# Patient Record
Sex: Female | Born: 1940
Health system: Southern US, Community
[De-identification: ages and names within clinical notes are randomized; demographics above are authoritative.]

## PROBLEM LIST (undated history)

## (undated) DIAGNOSIS — N3941 Urge incontinence: Secondary | ICD-10-CM

## (undated) DIAGNOSIS — E119 Type 2 diabetes mellitus without complications: Secondary | ICD-10-CM

## (undated) DIAGNOSIS — I890 Lymphedema, not elsewhere classified: Secondary | ICD-10-CM

## (undated) DIAGNOSIS — E785 Hyperlipidemia, unspecified: Secondary | ICD-10-CM

## (undated) DIAGNOSIS — Z87440 Personal history of urinary (tract) infections: Secondary | ICD-10-CM

## (undated) DIAGNOSIS — J4530 Mild persistent asthma, uncomplicated: Secondary | ICD-10-CM

## (undated) DIAGNOSIS — I10 Essential (primary) hypertension: Secondary | ICD-10-CM

## (undated) DIAGNOSIS — C50311 Malignant neoplasm of lower-inner quadrant of right female breast: Secondary | ICD-10-CM

## (undated) DIAGNOSIS — G4733 Obstructive sleep apnea (adult) (pediatric): Secondary | ICD-10-CM

## (undated) DIAGNOSIS — K589 Irritable bowel syndrome without diarrhea: Secondary | ICD-10-CM

## (undated) DIAGNOSIS — C55 Malignant neoplasm of uterus, part unspecified: Secondary | ICD-10-CM

## (undated) DIAGNOSIS — N3020 Other chronic cystitis without hematuria: Secondary | ICD-10-CM

## (undated) DIAGNOSIS — Z170 Estrogen receptor positive status [ER+]: Secondary | ICD-10-CM

## (undated) DIAGNOSIS — Z9989 Dependence on other enabling machines and devices: Secondary | ICD-10-CM

## (undated) DIAGNOSIS — M1990 Unspecified osteoarthritis, unspecified site: Secondary | ICD-10-CM

## (undated) DIAGNOSIS — F411 Generalized anxiety disorder: Secondary | ICD-10-CM

## (undated) DIAGNOSIS — S42309A Unspecified fracture of shaft of humerus, unspecified arm, initial encounter for closed fracture: Secondary | ICD-10-CM

## (undated) DIAGNOSIS — N281 Cyst of kidney, acquired: Secondary | ICD-10-CM

## (undated) DIAGNOSIS — Z923 Personal history of irradiation: Secondary | ICD-10-CM

## (undated) DIAGNOSIS — J302 Other seasonal allergic rhinitis: Secondary | ICD-10-CM

## (undated) DIAGNOSIS — S40029A Contusion of unspecified upper arm, initial encounter: Secondary | ICD-10-CM

## (undated) DIAGNOSIS — Z9221 Personal history of antineoplastic chemotherapy: Secondary | ICD-10-CM

## (undated) DIAGNOSIS — R413 Other amnesia: Secondary | ICD-10-CM

## (undated) DIAGNOSIS — Z973 Presence of spectacles and contact lenses: Secondary | ICD-10-CM

## (undated) HISTORY — PX: CATARACT EXTRACTION W/ INTRAOCULAR LENS  IMPLANT, BILATERAL: SHX1307

## (undated) HISTORY — DX: Hyperlipidemia, unspecified: E78.5

## (undated) HISTORY — DX: Other amnesia: R41.3

## (undated) HISTORY — PX: UMBILICAL HERNIA REPAIR: SHX196

## (undated) HISTORY — PX: COLONOSCOPY W/ POLYPECTOMY: SHX1380

## (undated) HISTORY — DX: Essential (primary) hypertension: I10

## (undated) HISTORY — PX: LUMBAR SPINE SURGERY: SHX701

---

## 1996-12-22 HISTORY — PX: BUNIONECTOMY: SHX129

## 1998-12-22 HISTORY — PX: BREAST SURGERY: SHX581

## 1999-12-23 HISTORY — PX: BREAST CYST EXCISION: SHX579

## 2004-05-30 ENCOUNTER — Other Ambulatory Visit: Payer: Self-pay

## 2004-11-25 ENCOUNTER — Ambulatory Visit: Payer: Self-pay | Admitting: Internal Medicine

## 2005-11-27 ENCOUNTER — Ambulatory Visit: Payer: Self-pay | Admitting: Internal Medicine

## 2005-12-01 ENCOUNTER — Ambulatory Visit: Payer: Self-pay | Admitting: Unknown Physician Specialty

## 2005-12-22 HISTORY — PX: TOTAL KNEE ARTHROPLASTY: SHX125

## 2006-03-18 ENCOUNTER — Ambulatory Visit: Payer: Self-pay | Admitting: Unknown Physician Specialty

## 2006-04-14 ENCOUNTER — Encounter: Payer: Self-pay | Admitting: Unknown Physician Specialty

## 2006-04-21 ENCOUNTER — Encounter: Payer: Self-pay | Admitting: Unknown Physician Specialty

## 2006-05-22 ENCOUNTER — Encounter: Payer: Self-pay | Admitting: Unknown Physician Specialty

## 2006-06-21 ENCOUNTER — Encounter: Payer: Self-pay | Admitting: Unknown Physician Specialty

## 2006-07-22 ENCOUNTER — Encounter: Payer: Self-pay | Admitting: Unknown Physician Specialty

## 2006-08-13 ENCOUNTER — Ambulatory Visit: Payer: Self-pay | Admitting: Unknown Physician Specialty

## 2006-08-17 ENCOUNTER — Emergency Department: Payer: Self-pay | Admitting: Emergency Medicine

## 2006-08-22 ENCOUNTER — Encounter: Payer: Self-pay | Admitting: Unknown Physician Specialty

## 2006-09-09 ENCOUNTER — Encounter: Payer: Self-pay | Admitting: Unknown Physician Specialty

## 2006-09-21 ENCOUNTER — Encounter: Payer: Self-pay | Admitting: Unknown Physician Specialty

## 2006-10-22 ENCOUNTER — Encounter: Payer: Self-pay | Admitting: Unknown Physician Specialty

## 2006-11-21 ENCOUNTER — Encounter: Payer: Self-pay | Admitting: Unknown Physician Specialty

## 2006-12-22 ENCOUNTER — Encounter: Payer: Self-pay | Admitting: Unknown Physician Specialty

## 2006-12-30 ENCOUNTER — Ambulatory Visit: Payer: Self-pay | Admitting: Internal Medicine

## 2007-01-22 ENCOUNTER — Encounter: Payer: Self-pay | Admitting: Unknown Physician Specialty

## 2007-03-15 ENCOUNTER — Other Ambulatory Visit: Payer: Self-pay

## 2007-03-15 ENCOUNTER — Ambulatory Visit: Payer: Self-pay | Admitting: General Practice

## 2007-03-29 ENCOUNTER — Inpatient Hospital Stay: Payer: Self-pay | Admitting: General Practice

## 2007-04-03 ENCOUNTER — Emergency Department: Payer: Self-pay

## 2007-04-19 ENCOUNTER — Encounter: Payer: Self-pay | Admitting: General Practice

## 2007-04-22 ENCOUNTER — Encounter: Payer: Self-pay | Admitting: General Practice

## 2007-05-23 ENCOUNTER — Encounter: Payer: Self-pay | Admitting: General Practice

## 2007-06-22 ENCOUNTER — Encounter: Payer: Self-pay | Admitting: General Practice

## 2007-06-23 ENCOUNTER — Encounter: Payer: Self-pay | Admitting: Internal Medicine

## 2007-07-23 ENCOUNTER — Encounter: Payer: Self-pay | Admitting: Internal Medicine

## 2007-08-23 ENCOUNTER — Encounter: Payer: Self-pay | Admitting: Internal Medicine

## 2007-10-05 ENCOUNTER — Other Ambulatory Visit: Payer: Self-pay

## 2007-10-05 ENCOUNTER — Ambulatory Visit: Payer: Self-pay | Admitting: General Practice

## 2007-10-18 ENCOUNTER — Inpatient Hospital Stay: Payer: Self-pay | Admitting: General Practice

## 2007-11-02 ENCOUNTER — Encounter: Payer: Self-pay | Admitting: General Practice

## 2007-11-22 ENCOUNTER — Encounter: Payer: Self-pay | Admitting: General Practice

## 2007-12-23 ENCOUNTER — Encounter: Payer: Self-pay | Admitting: General Practice

## 2008-01-23 ENCOUNTER — Encounter: Payer: Self-pay | Admitting: General Practice

## 2008-01-27 ENCOUNTER — Ambulatory Visit: Payer: Self-pay | Admitting: Internal Medicine

## 2009-01-30 ENCOUNTER — Ambulatory Visit: Payer: Self-pay | Admitting: Internal Medicine

## 2009-02-19 ENCOUNTER — Ambulatory Visit: Payer: Self-pay | Admitting: Unknown Physician Specialty

## 2009-12-22 HISTORY — PX: TOTAL SHOULDER REPLACEMENT: SUR1217

## 2010-02-04 ENCOUNTER — Ambulatory Visit: Payer: Self-pay | Admitting: Internal Medicine

## 2010-06-27 ENCOUNTER — Ambulatory Visit: Payer: Self-pay | Admitting: General Practice

## 2010-07-08 ENCOUNTER — Inpatient Hospital Stay: Payer: Self-pay | Admitting: General Practice

## 2010-07-09 LAB — PATHOLOGY REPORT

## 2010-07-25 ENCOUNTER — Encounter: Payer: Self-pay | Admitting: General Practice

## 2010-08-22 ENCOUNTER — Encounter: Payer: Self-pay | Admitting: General Practice

## 2010-09-21 ENCOUNTER — Encounter: Payer: Self-pay | Admitting: General Practice

## 2010-10-22 ENCOUNTER — Encounter: Payer: Self-pay | Admitting: General Practice

## 2010-11-23 ENCOUNTER — Emergency Department: Payer: Self-pay | Admitting: Internal Medicine

## 2011-02-05 ENCOUNTER — Ambulatory Visit: Payer: Self-pay | Admitting: Otolaryngology

## 2011-02-11 ENCOUNTER — Ambulatory Visit: Payer: Self-pay | Admitting: Internal Medicine

## 2011-02-13 ENCOUNTER — Ambulatory Visit: Payer: Self-pay | Admitting: Otolaryngology

## 2011-02-17 LAB — PATHOLOGY REPORT

## 2012-02-13 ENCOUNTER — Ambulatory Visit: Payer: Self-pay | Admitting: Obstetrics and Gynecology

## 2012-05-15 ENCOUNTER — Emergency Department: Payer: Self-pay | Admitting: Emergency Medicine

## 2012-05-15 LAB — URINALYSIS, COMPLETE
Bacteria: NONE SEEN
Bilirubin,UR: NEGATIVE
Glucose,UR: NEGATIVE mg/dL (ref 0–75)
Ketone: NEGATIVE
Nitrite: NEGATIVE
Protein: 100
RBC,UR: 1007 /HPF (ref 0–5)
Specific Gravity: 1.023 (ref 1.003–1.030)
WBC UR: 308 /HPF (ref 0–5)

## 2012-05-17 LAB — URINE CULTURE

## 2012-09-14 ENCOUNTER — Ambulatory Visit: Payer: Self-pay | Admitting: Internal Medicine

## 2013-02-14 ENCOUNTER — Ambulatory Visit: Payer: Self-pay | Admitting: Obstetrics and Gynecology

## 2013-12-22 HISTORY — PX: NASAL SINUS SURGERY: SHX719

## 2014-02-15 ENCOUNTER — Ambulatory Visit: Payer: Self-pay | Admitting: Obstetrics and Gynecology

## 2014-03-23 ENCOUNTER — Ambulatory Visit: Payer: Self-pay | Admitting: Surgery

## 2014-03-23 DIAGNOSIS — I10 Essential (primary) hypertension: Secondary | ICD-10-CM

## 2014-04-06 ENCOUNTER — Ambulatory Visit: Payer: Self-pay | Admitting: Surgery

## 2014-04-21 DIAGNOSIS — Z9989 Dependence on other enabling machines and devices: Secondary | ICD-10-CM | POA: Insufficient documentation

## 2014-04-21 DIAGNOSIS — D126 Benign neoplasm of colon, unspecified: Secondary | ICD-10-CM | POA: Insufficient documentation

## 2014-04-21 DIAGNOSIS — I878 Other specified disorders of veins: Secondary | ICD-10-CM | POA: Insufficient documentation

## 2014-04-21 DIAGNOSIS — R87619 Unspecified abnormal cytological findings in specimens from cervix uteri: Secondary | ICD-10-CM | POA: Insufficient documentation

## 2014-04-21 DIAGNOSIS — J45909 Unspecified asthma, uncomplicated: Secondary | ICD-10-CM | POA: Insufficient documentation

## 2014-04-21 DIAGNOSIS — L508 Other urticaria: Secondary | ICD-10-CM | POA: Insufficient documentation

## 2014-04-21 DIAGNOSIS — N6019 Diffuse cystic mastopathy of unspecified breast: Secondary | ICD-10-CM | POA: Insufficient documentation

## 2014-04-21 DIAGNOSIS — F419 Anxiety disorder, unspecified: Secondary | ICD-10-CM | POA: Insufficient documentation

## 2014-05-22 ENCOUNTER — Ambulatory Visit: Payer: Self-pay | Admitting: Unknown Physician Specialty

## 2014-05-25 LAB — PATHOLOGY REPORT

## 2014-09-04 ENCOUNTER — Encounter: Payer: Self-pay | Admitting: Internal Medicine

## 2014-09-04 ENCOUNTER — Ambulatory Visit (INDEPENDENT_AMBULATORY_CARE_PROVIDER_SITE_OTHER): Payer: Medicare PPO | Admitting: Internal Medicine

## 2014-09-04 VITALS — BP 122/68 | HR 64 | Temp 97.8°F | Ht 61.0 in | Wt 238.0 lb

## 2014-09-04 DIAGNOSIS — J309 Allergic rhinitis, unspecified: Secondary | ICD-10-CM

## 2014-09-04 DIAGNOSIS — J4530 Mild persistent asthma, uncomplicated: Secondary | ICD-10-CM | POA: Insufficient documentation

## 2014-09-04 DIAGNOSIS — J45909 Unspecified asthma, uncomplicated: Secondary | ICD-10-CM

## 2014-09-04 DIAGNOSIS — I10 Essential (primary) hypertension: Secondary | ICD-10-CM

## 2014-09-04 DIAGNOSIS — N3941 Urge incontinence: Secondary | ICD-10-CM

## 2014-09-04 DIAGNOSIS — F411 Generalized anxiety disorder: Secondary | ICD-10-CM

## 2014-09-04 DIAGNOSIS — Z23 Encounter for immunization: Secondary | ICD-10-CM

## 2014-09-04 DIAGNOSIS — K219 Gastro-esophageal reflux disease without esophagitis: Secondary | ICD-10-CM

## 2014-09-04 DIAGNOSIS — J302 Other seasonal allergic rhinitis: Secondary | ICD-10-CM | POA: Insufficient documentation

## 2014-09-04 DIAGNOSIS — E785 Hyperlipidemia, unspecified: Secondary | ICD-10-CM

## 2014-09-04 LAB — COMPREHENSIVE METABOLIC PANEL
ALT: 17 U/L (ref 0–35)
AST: 16 U/L (ref 0–37)
Albumin: 3.7 g/dL (ref 3.5–5.2)
Alkaline Phosphatase: 53 U/L (ref 39–117)
BUN: 17 mg/dL (ref 6–23)
CALCIUM: 10 mg/dL (ref 8.4–10.5)
CO2: 27 meq/L (ref 19–32)
CREATININE: 0.7 mg/dL (ref 0.4–1.2)
Chloride: 102 mEq/L (ref 96–112)
GFR: 90.71 mL/min (ref >60.00)
GLUCOSE: 107 mg/dL — AB (ref 70–99)
Potassium: 3.9 mEq/L (ref 3.5–5.1)
Sodium: 138 mEq/L (ref 135–145)
Total Bilirubin: 0.5 mg/dL (ref 0.2–1.2)
Total Protein: 7.1 g/dL (ref 6.0–8.3)

## 2014-09-04 LAB — CBC
HCT: 42.2 % (ref 36.0–46.0)
HEMOGLOBIN: 13.9 g/dL (ref 12.0–15.0)
MCHC: 32.9 g/dL (ref 30.0–36.0)
MCV: 88.8 fl (ref 78.0–100.0)
Platelets: 295 10*3/uL (ref 150.0–400.0)
RBC: 4.75 Mil/uL (ref 3.87–5.11)
RDW: 13.7 % (ref 11.5–15.5)
WBC: 7.3 10*3/uL (ref 4.0–10.5)

## 2014-09-04 LAB — LIPID PANEL
Cholesterol: 156 mg/dL (ref 0–200)
HDL: 40.2 mg/dL (ref >39.00)
NonHDL: 115.8
Total CHOL/HDL Ratio: 4
Triglycerides: 212 mg/dL — ABNORMAL HIGH (ref 0.0–149.0)
VLDL: 42.4 mg/dL — ABNORMAL HIGH (ref 0.0–40.0)

## 2014-09-04 LAB — HEMOGLOBIN A1C: HEMOGLOBIN A1C: 6.7 % — AB (ref 4.6–6.5)

## 2014-09-04 LAB — LDL CHOLESTEROL, DIRECT: Direct LDL: 96.5 mg/dL

## 2014-09-04 NOTE — Progress Notes (Signed)
Pre visit review using our clinic review tool, if applicable. No additional management support is needed unless otherwise documented below in the visit note. 

## 2014-09-04 NOTE — Assessment & Plan Note (Signed)
Takes zyrtec daily Gets allergy shots weekly

## 2014-09-04 NOTE — Progress Notes (Signed)
HPI  Pt presents to the clinic today to establish care. She is transferring care from Dr. Doy Hutching.   Flu: 2014 Tetanus: 2015 Pneuomvax: 2013 Prevnar: never Zostovax: 2015 Pap Smear: 12/2013 Mammogram: 2015- normal Bone Density: 2014- normal Colon Screening: 2015 Eye Doctor: yearly Dentist: biannually  Past Medical History  Diagnosis Date  . Asthma   . Arthritis   . Chicken pox   . Allergy   . Hypertension   . Hyperlipidemia   . Colon polyps   . Phlebitis     Current Outpatient Prescriptions  Medication Sig Dispense Refill  . albuterol (PROAIR HFA) 108 (90 BASE) MCG/ACT inhaler Inhale 2 puffs into the lungs every 6 (six) hours as needed.      Marland Kitchen albuterol-ipratropium (COMBIVENT) 18-103 MCG/ACT inhaler Inhale 2 puffs into the lungs every 4 (four) hours.      . ALPRAZolam (XANAX) 0.25 MG tablet Take 0.25 mg by mouth 2 (two) times daily as needed.      Marland Kitchen aspirin EC 81 MG tablet Take 81 mg by mouth daily.      . Calcium Carb-Ergocalciferol 500-200 MG-UNIT TABS Take 1 tablet by mouth 2 (two) times daily.      . cetirizine (ZYRTEC) 10 MG tablet Take 10 mg by mouth daily.      . cloNIDine (CATAPRES) 0.1 MG tablet Take 0.1 mg by mouth 2 (two) times daily.      Marland Kitchen EPINEPHrine 0.3 mg/0.3 mL IJ SOAJ injection Inject 0.3 mg into the muscle as needed.      . naproxen sodium (ALEVE) 220 MG tablet Take 2 tablets by mouth as needed.      . Olmesartan-Amlodipine-HCTZ (TRIBENZOR) 40-5-25 MG TABS Take 1 tablet by mouth daily.      Marland Kitchen omeprazole (PRILOSEC) 40 MG capsule Take 40 mg by mouth daily.      . polyethylene glycol powder (GLYCOLAX/MIRALAX) powder Take 17 g by mouth as needed.      . pravastatin (PRAVACHOL) 40 MG tablet Take 40 mg by mouth daily.      . ranitidine (ZANTAC) 150 MG tablet Take 150 mg by mouth daily.      . solifenacin (VESICARE) 10 MG tablet Take 10 mg by mouth daily.       No current facility-administered medications for this visit.    Allergies  Allergen Reactions  .  Other Swelling    Cats, dogs, mold    Family History  Problem Relation Age of Onset  . Arthritis Mother   . Stroke Mother   . Hypertension Mother   . Cancer Father     Prostate  . Stroke Father   . Hypertension Father   . Hypertension Maternal Grandmother   . Rheum arthritis Maternal Grandfather   . Stroke Maternal Grandfather   . Hypertension Maternal Grandfather   . Cancer Paternal Grandmother     Colon  . Hypertension Paternal Grandmother   . Hypertension Paternal Grandfather     History   Social History  . Marital Status: Married    Spouse Name: N/A    Number of Children: N/A  . Years of Education: N/A   Occupational History  . Not on file.   Social History Main Topics  . Smoking status: Former Research scientist (life sciences)  . Smokeless tobacco: Not on file     Comment: quit 22 years ago  . Alcohol Use: Yes     Comment: rare  . Drug Use: Not on file  . Sexual Activity: Not on file  Other Topics Concern  . Not on file   Social History Narrative  . No narrative on file    ROS:  Constitutional: Pt reports fatigue. Denies fever, malaise, headache or abrupt weight changes.  HEENT: Denies eye pain, eye redness, ear pain, ringing in the ears, wax buildup, runny nose, nasal congestion, bloody nose, or sore throat. Respiratory: Pt reports shortness of breath with exertion. Denies difficulty breathing, shortness of breath, cough or sputum production.   Cardiovascular: Denies chest pain, chest tightness, palpitations or swelling in the hands or feet.  Gastrointestinal: Denies abdominal pain, bloating, constipation, diarrhea or blood in the stool.  GU: Pt reports urge incontinence. Denies frequency, pain with urination, blood in urine, odor or discharge. Musculoskeletal: Pt reports arthritis in knees and shoulders. Denies decrease in range of motion, difficulty with gait, muscle pain or joint swelling.  Skin: Denies redness, rashes, lesions or ulcercations.  Neurological: Denies  dizziness, difficulty with memory, difficulty with speech or problems with balance and coordination.   No other specific complaints in a complete review of systems (except as listed in HPI above).  PE:  BP 122/68  Pulse 64  Temp(Src) 97.8 F (36.6 C) (Oral)  Ht 5\' 1"  (1.549 m)  Wt 238 lb (107.956 kg)  BMI 44.99 kg/m2  Wt Readings from Last 3 Encounters:  09/04/14 238 lb (107.956 kg)    General: Appears her stated age, obese but well developed, well nourished in NAD. Cardiovascular: Normal rate and rhythm. Distant S1,S2 noted.  No murmur, rubs or gallops noted. No JVD. Trace edema in LLE.  No carotid bruits noted. Pulmonary/Chest: Normal effort and positive vesicular breath sounds. No respiratory distress. No wheezes, rales or ronchi noted.  Abdomen: Soft and nontender. Normal bowel sounds, no bruits noted. No distention or masses noted. Liver, spleen and kidneys non palpable. Neurological: Alert and oriented. Cranial nerves II-XII intact.  Psychiatric: Mood and affect normal. Behavior is normal. Judgment and thought content normal.    Assessment and Plan:  Health Maintenance:  Flu shot today  RTC in 6 months for 6 month followup of chronic conditions

## 2014-09-04 NOTE — Assessment & Plan Note (Addendum)
On prilosec and zantac Will check CBC and CMET today

## 2014-09-04 NOTE — Assessment & Plan Note (Signed)
Encouraged her to work on diet and exercise Will check A1C as she reports she has been borderline diabetic

## 2014-09-04 NOTE — Patient Instructions (Addendum)
Fat and Cholesterol Control Diet Fat and cholesterol levels in your blood and organs are influenced by your diet. High levels of fat and cholesterol may lead to diseases of the heart, small and large blood vessels, gallbladder, liver, and pancreas. CONTROLLING FAT AND CHOLESTEROL WITH DIET Although exercise and lifestyle factors are important, your diet is key. That is because certain foods are known to raise cholesterol and others to lower it. The goal is to balance foods for their effect on cholesterol and more importantly, to replace saturated and trans fat with other types of fat, such as monounsaturated fat, polyunsaturated fat, and omega-3 fatty acids. On average, a person should consume no more than 15 to 17 g of saturated fat daily. Saturated and trans fats are considered "bad" fats, and they will raise LDL cholesterol. Saturated fats are primarily found in animal products such as meats, butter, and cream. However, that does not mean you need to give up all your favorite foods. Today, there are good tasting, low-fat, low-cholesterol substitutes for most of the things you like to eat. Choose low-fat or nonfat alternatives. Choose round or loin cuts of red meat. These types of cuts are lowest in fat and cholesterol. Chicken (without the skin), fish, veal, and ground turkey breast are great choices. Eliminate fatty meats, such as hot dogs and salami. Even shellfish have little or no saturated fat. Have a 3 oz (85 g) portion when you eat lean meat, poultry, or fish. Trans fats are also called "partially hydrogenated oils." They are oils that have been scientifically manipulated so that they are solid at room temperature resulting in a longer shelf life and improved taste and texture of foods in which they are added. Trans fats are found in stick margarine, some tub margarines, cookies, crackers, and baked goods.  When baking and cooking, oils are a great substitute for butter. The monounsaturated oils are  especially beneficial since it is believed they lower LDL and raise HDL. The oils you should avoid entirely are saturated tropical oils, such as coconut and palm.  Remember to eat a lot from food groups that are naturally free of saturated and trans fat, including fish, fruit, vegetables, beans, grains (barley, rice, couscous, bulgur wheat), and pasta (without cream sauces).  IDENTIFYING FOODS THAT LOWER FAT AND CHOLESTEROL  Soluble fiber may lower your cholesterol. This type of fiber is found in fruits such as apples, vegetables such as broccoli, potatoes, and carrots, legumes such as beans, peas, and lentils, and grains such as barley. Foods fortified with plant sterols (phytosterol) may also lower cholesterol. You should eat at least 2 g per day of these foods for a cholesterol lowering effect.  Read package labels to identify low-saturated fats, trans fat free, and low-fat foods at the supermarket. Select cheeses that have only 2 to 3 g saturated fat per ounce. Use a heart-healthy tub margarine that is free of trans fats or partially hydrogenated oil. When buying baked goods (cookies, crackers), avoid partially hydrogenated oils. Breads and muffins should be made from whole grains (whole-wheat or whole oat flour, instead of "flour" or "enriched flour"). Buy non-creamy canned soups with reduced salt and no added fats.  FOOD PREPARATION TECHNIQUES  Never deep-fry. If you must fry, either stir-fry, which uses very little fat, or use non-stick cooking sprays. When possible, broil, bake, or roast meats, and steam vegetables. Instead of putting butter or margarine on vegetables, use lemon and herbs, applesauce, and cinnamon (for squash and sweet potatoes). Use nonfat   yogurt, salsa, and low-fat dressings for salads.  LOW-SATURATED FAT / LOW-FAT FOOD SUBSTITUTES Meats / Saturated Fat (g)  Avoid: Steak, marbled (3 oz/85 g) / 11 g  Choose: Steak, lean (3 oz/85 g) / 4 g  Avoid: Hamburger (3 oz/85 g) / 7  g  Choose: Hamburger, lean (3 oz/85 g) / 5 g  Avoid: Ham (3 oz/85 g) / 6 g  Choose: Ham, lean cut (3 oz/85 g) / 2.4 g  Avoid: Chicken, with skin, dark meat (3 oz/85 g) / 4 g  Choose: Chicken, skin removed, dark meat (3 oz/85 g) / 2 g  Avoid: Chicken, with skin, light meat (3 oz/85 g) / 2.5 g  Choose: Chicken, skin removed, light meat (3 oz/85 g) / 1 g Dairy / Saturated Fat (g)  Avoid: Whole milk (1 cup) / 5 g  Choose: Low-fat milk, 2% (1 cup) / 3 g  Choose: Low-fat milk, 1% (1 cup) / 1.5 g  Choose: Skim milk (1 cup) / 0.3 g  Avoid: Hard cheese (1 oz/28 g) / 6 g  Choose: Skim milk cheese (1 oz/28 g) / 2 to 3 g  Avoid: Cottage cheese, 4% fat (1 cup) / 6.5 g  Choose: Low-fat cottage cheese, 1% fat (1 cup) / 1.5 g  Avoid: Ice cream (1 cup) / 9 g  Choose: Sherbet (1 cup) / 2.5 g  Choose: Nonfat frozen yogurt (1 cup) / 0.3 g  Choose: Frozen fruit bar / trace  Avoid: Whipped cream (1 tbs) / 3.5 g  Choose: Nondairy whipped topping (1 tbs) / 1 g Condiments / Saturated Fat (g)  Avoid: Mayonnaise (1 tbs) / 2 g  Choose: Low-fat mayonnaise (1 tbs) / 1 g  Avoid: Butter (1 tbs) / 7 g  Choose: Extra light margarine (1 tbs) / 1 g  Avoid: Coconut oil (1 tbs) / 11.8 g  Choose: Olive oil (1 tbs) / 1.8 g  Choose: Corn oil (1 tbs) / 1.7 g  Choose: Safflower oil (1 tbs) / 1.2 g  Choose: Sunflower oil (1 tbs) / 1.4 g  Choose: Soybean oil (1 tbs) / 2.4 g  Choose: Canola oil (1 tbs) / 1 g Document Released: 12/08/2005 Document Revised: 04/04/2013 Document Reviewed: 03/08/2014 [employer] Patient Information 2015 [employer]. This information is not intended to replace advice given to you by your health care provider. Make sure you discuss any questions you have with your health care provider.  

## 2014-09-04 NOTE — Addendum Note (Signed)
Addended by: Lurlean Nanny on: 09/04/2014 11:14 AM   Modules accepted: Orders

## 2014-09-04 NOTE — Assessment & Plan Note (Signed)
Well controlled on Tribenzor and Clonidine Will check CBC and CMET today Consider peeling back medications at next visit

## 2014-09-04 NOTE — Assessment & Plan Note (Signed)
Takes xanax BID

## 2014-09-04 NOTE — Assessment & Plan Note (Signed)
No issues on pravastatin Will check CMET and Lipid profile today

## 2014-09-04 NOTE — Assessment & Plan Note (Signed)
Improved on Vesicare

## 2014-09-04 NOTE — Assessment & Plan Note (Signed)
On combivent daily Rare albuterol use

## 2014-09-18 ENCOUNTER — Telehealth: Payer: Self-pay | Admitting: Internal Medicine

## 2014-09-18 NOTE — Telephone Encounter (Signed)
Patient's husband received your letter and said that you need to call her back at work or you can call her husband,Russell, at [phone].  Patient's husband said she doesn't answer her cell phone.

## 2014-09-19 NOTE — Telephone Encounter (Signed)
Pt left v/m requesting cb after receiving letter.

## 2014-10-02 NOTE — Telephone Encounter (Signed)
Lab letter mailed. 

## 2015-02-01 ENCOUNTER — Other Ambulatory Visit: Payer: Self-pay

## 2015-02-01 MED ORDER — OLMESARTAN-AMLODIPINE-HCTZ 40-5-25 MG PO TABS: 1.0000 | ORAL_TABLET | Freq: Every day | ORAL | Status: DC

## 2015-02-01 MED ORDER — CLONIDINE HCL 0.1 MG PO TABS: 0.1000 mg | ORAL_TABLET | Freq: Two times a day (BID) | ORAL | Status: DC

## 2015-02-01 NOTE — Telephone Encounter (Signed)
Pt left note requesting refill clonidine and tribenzor [facility] [address]; pt out of meds. pt has f/u appt 04/02/15. Left v/m for pt refills done and keep appt on 04/02/15.

## 2015-02-06 NOTE — Telephone Encounter (Signed)
Pt request status of clonidine refill; spoke with Alyse Low at [address] and rx has been ready for pick up. Pt notified and voiced understanding.

## 2015-02-15 ENCOUNTER — Other Ambulatory Visit: Payer: Self-pay

## 2015-02-15 MED ORDER — PRAVASTATIN SODIUM 40 MG PO TABS: 40.0000 mg | ORAL_TABLET | Freq: Every day | ORAL | Status: DC

## 2015-02-15 NOTE — Telephone Encounter (Signed)
Pt request refill pravastatin to [facility] [address]. Pt has 6 mth f/u 04/02/2015. Advised pt done.

## 2015-02-23 ENCOUNTER — Other Ambulatory Visit: Payer: Self-pay

## 2015-02-23 MED ORDER — SOLIFENACIN SUCCINATE 10 MG PO TABS: 10.0000 mg | ORAL_TABLET | Freq: Every day | ORAL | Status: DC

## 2015-02-23 NOTE — Telephone Encounter (Signed)
Pt request refill vesicare to [facility] [address] until pt seen 04/02/15. Advised pt done.

## 2015-03-24 ENCOUNTER — Other Ambulatory Visit: Payer: Self-pay | Admitting: Internal Medicine

## 2015-04-02 ENCOUNTER — Ambulatory Visit: Payer: Medicare PPO | Admitting: Internal Medicine

## 2015-04-04 ENCOUNTER — Ambulatory Visit (INDEPENDENT_AMBULATORY_CARE_PROVIDER_SITE_OTHER): Payer: Medicare PPO | Admitting: Internal Medicine

## 2015-04-04 ENCOUNTER — Encounter: Payer: Self-pay | Admitting: Internal Medicine

## 2015-04-04 VITALS — BP 128/80 | HR 70 | Temp 98.2°F | Wt 239.0 lb

## 2015-04-04 DIAGNOSIS — Z23 Encounter for immunization: Secondary | ICD-10-CM

## 2015-04-04 DIAGNOSIS — N3941 Urge incontinence: Secondary | ICD-10-CM

## 2015-04-04 DIAGNOSIS — J4530 Mild persistent asthma, uncomplicated: Secondary | ICD-10-CM

## 2015-04-04 DIAGNOSIS — Z13820 Encounter for screening for osteoporosis: Secondary | ICD-10-CM | POA: Diagnosis not present

## 2015-04-04 DIAGNOSIS — I10 Essential (primary) hypertension: Secondary | ICD-10-CM

## 2015-04-04 DIAGNOSIS — Z1239 Encounter for other screening for malignant neoplasm of breast: Secondary | ICD-10-CM | POA: Diagnosis not present

## 2015-04-04 DIAGNOSIS — E119 Type 2 diabetes mellitus without complications: Secondary | ICD-10-CM

## 2015-04-04 DIAGNOSIS — F411 Generalized anxiety disorder: Secondary | ICD-10-CM

## 2015-04-04 DIAGNOSIS — K219 Gastro-esophageal reflux disease without esophagitis: Secondary | ICD-10-CM

## 2015-04-04 DIAGNOSIS — E785 Hyperlipidemia, unspecified: Secondary | ICD-10-CM | POA: Diagnosis not present

## 2015-04-04 DIAGNOSIS — J302 Other seasonal allergic rhinitis: Secondary | ICD-10-CM

## 2015-04-04 LAB — COMPREHENSIVE METABOLIC PANEL
ALK PHOS: 64 U/L (ref 39–117)
ALT: 18 U/L (ref 0–35)
AST: 18 U/L (ref 0–37)
Albumin: 4 g/dL (ref 3.5–5.2)
BUN: 20 mg/dL (ref 6–23)
CO2: 32 mEq/L (ref 19–32)
Calcium: 10.4 mg/dL (ref 8.4–10.5)
Chloride: 101 mEq/L (ref 96–112)
Creatinine, Ser: 0.64 mg/dL (ref 0.40–1.20)
GFR: 97.13 mL/min (ref >60.00)
GLUCOSE: 89 mg/dL (ref 70–99)
Potassium: 4.3 mEq/L (ref 3.5–5.1)
SODIUM: 138 meq/L (ref 135–145)
Total Bilirubin: 0.4 mg/dL (ref 0.2–1.2)
Total Protein: 7.3 g/dL (ref 6.0–8.3)

## 2015-04-04 LAB — CBC
HEMATOCRIT: 41.6 % (ref 36.0–46.0)
Hemoglobin: 14.1 g/dL (ref 12.0–15.0)
MCHC: 33.9 g/dL (ref 30.0–36.0)
MCV: 86.4 fl (ref 78.0–100.0)
PLATELETS: 310 10*3/uL (ref 150.0–400.0)
RBC: 4.82 Mil/uL (ref 3.87–5.11)
RDW: 13.9 % (ref 11.5–15.5)
WBC: 8.4 10*3/uL (ref 4.0–10.5)

## 2015-04-04 LAB — MICROALBUMIN / CREATININE URINE RATIO
Creatinine,U: 87.8 mg/dL
Microalb Creat Ratio: 0.8 mg/g (ref 0.0–30.0)
Microalb, Ur: <0.7 mg/dL (ref 0.0–1.9)

## 2015-04-04 LAB — LIPID PANEL
Cholesterol: 186 mg/dL (ref 0–200)
HDL: 56.4 mg/dL (ref >39.00)
NonHDL: 129.6
Total CHOL/HDL Ratio: 3
Triglycerides: 201 mg/dL — ABNORMAL HIGH (ref 0.0–149.0)
VLDL: 40.2 mg/dL — ABNORMAL HIGH (ref 0.0–40.0)

## 2015-04-04 LAB — LDL CHOLESTEROL, DIRECT: LDL DIRECT: 105 mg/dL

## 2015-04-04 LAB — HEMOGLOBIN A1C: Hgb A1c MFr Bld: 6.9 % — ABNORMAL HIGH (ref 4.6–6.5)

## 2015-04-04 LAB — VITAMIN D 25 HYDROXY (VIT D DEFICIENCY, FRACTURES): VITD: 26.66 ng/mL — AB (ref 30.00–100.00)

## 2015-04-04 NOTE — Assessment & Plan Note (Signed)
Continue zyrtec daily  

## 2015-04-04 NOTE — Progress Notes (Signed)
Subjective:    Patient ID: Tiffany Arnold, female    DOB: 10-12-1941, 74 y.o.   MRN: [id number]  HPI  Pt presents to the clinic today for 6 month follow up of chronic conditions.  Asthma: Mild, persistent.  She takes Combivent daily. Rare Albuterol use.  HTN: BP well controlled on Tribenzor and Clonidine. Her BP today is 128/80.  GERD: She denies breakthrough symptoms on Prilosec and Zantac. She tries to avoid foods that make her reflux worse. She is complaining of some symptoms of "food getting stuck" when she swallows. She has not choked on her food.  GAD: Her anxiety is well controlled on Xanax twice daily. She denies depression, SI/HI.  HLD: Last LDL 96.5. She denies myalgias on Pravastatin. She does try to consume a low fat diet.  Seasonal allergies: Worse in the spring. She takes Zyrtec daily.  Obesity: Her BMI is 45.18. She has gained 1 lb in the last 6 months.   Urge Incontinence: She takes Vesicare daily. It has helped a little with her incontinence.  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 She does not take any medication for this. Her diabetes is controlled with diet. She does not test her sugars. Her last eye exam was last year. Flu- 08/2014 Pneumovax- 01/2012. Prevnar- never.  She also requests an order of mammogram - wants to go [facility].    Review of Systems      Past Medical History  Diagnosis Date  . Asthma   . Arthritis   . Chicken pox   . Allergy   . Hypertension   . Hyperlipidemia   . Colon polyps   . Phlebitis     Current Outpatient Prescriptions  Medication Sig Dispense Refill  . albuterol (PROAIR HFA) 108 (90 BASE) MCG/ACT inhaler Inhale 2 puffs into the lungs every 6 (six) hours as needed.    Marland Kitchen albuterol-ipratropium (COMBIVENT) 18-103 MCG/ACT inhaler Inhale 2 puffs into the lungs every 4 (four) hours.    . ALPRAZolam (XANAX) 0.25 MG tablet Take 0.25 mg by mouth 2 (two) times daily as needed.    Marland Kitchen aspirin EC 81 MG tablet Take 81 mg by mouth  daily.    . Calcium Carb-Ergocalciferol 500-200 MG-UNIT TABS Take 1 tablet by mouth 2 (two) times daily.    . cetirizine (ZYRTEC) 10 MG tablet Take 10 mg by mouth daily.    . cloNIDine (CATAPRES) 0.1 MG tablet TAKE ONE TABLET BY MOUTH TWICE DAILY 60 tablet 0  . EPINEPHrine 0.3 mg/0.3 mL IJ SOAJ injection Inject 0.3 mg into the muscle as needed.    . naproxen sodium (ALEVE) 220 MG tablet Take 2 tablets by mouth as needed.    . Olmesartan-Amlodipine-HCTZ (TRIBENZOR) 40-5-25 MG TABS Take 1 tablet by mouth daily. 30 tablet 1  . omeprazole (PRILOSEC) 40 MG capsule Take 40 mg by mouth daily.    . polyethylene glycol powder (GLYCOLAX/MIRALAX) powder Take 17 g by mouth as needed.    . pravastatin (PRAVACHOL) 40 MG tablet Take 1 tablet (40 mg total) by mouth daily. 30 tablet 1  . ranitidine (ZANTAC) 150 MG tablet Take 150 mg by mouth daily.    . solifenacin (VESICARE) 10 MG tablet Take 1 tablet (10 mg total) by mouth daily. 30 tablet 1   No current facility-administered medications for this visit.    Allergies  Allergen Reactions  . Other Swelling    Cats, dogs, mold    Family History  Problem Relation Age of  Onset  . Arthritis Mother   . Stroke Mother   . Hypertension Mother   . Cancer Father     Prostate  . Stroke Father   . Hypertension Father   . Hypertension Maternal Grandmother   . Rheum arthritis Maternal Grandfather   . Stroke Maternal Grandfather   . Hypertension Maternal Grandfather   . Cancer Paternal Grandmother     Colon  . Hypertension Paternal Grandmother   . Hypertension Paternal Grandfather     History   Social History  . Marital Status: Married    Spouse Name: N/A  . Number of Children: N/A  . Years of Education: N/A   Occupational History  . Not on file.   Social History Main Topics  . Smoking status: Former Research scientist (life sciences)  . Smokeless tobacco: Not on file     Comment: quit 22 years ago  . Alcohol Use: Yes     Comment: rare  . Drug Use: No  . Sexual  Activity: Not Currently   Other Topics Concern  . Not on file   Social History Narrative     Constitutional: Denies fever, malaise, fatigue, headache or abrupt weight changes.  HEENT: Denies eye pain, eye redness, ear pain, ringing in the ears, wax buildup, runny nose, nasal congestion, bloody nose, or sore throat. Respiratory: Denies difficulty breathing, shortness of breath, cough or sputum production.   Cardiovascular: Denies chest pain, chest tightness, palpitations or swelling in the hands or feet.  Gastrointestinal: Denies abdominal pain, bloating, constipation, diarrhea or blood in the stool.  GU: Denies urgency, frequency, pain with urination, burning sensation, blood in urine, odor or discharge. Musculoskeletal: Denies decrease in range of motion, difficulty with gait, muscle pain or joint pain and swelling.  Skin: Denies redness, rashes, lesions or ulcercations.  Neurological: Denies dizziness, difficulty with memory, difficulty with speech or problems with balance and coordination.  Psych: Denies anxiety, depression, SI/HI.  No other specific complaints in a complete review of systems (except as listed in HPI above).  Objective:   Physical Exam   BP 128/80 mmHg  Pulse 70  Temp(Src) 98.2 F (36.8 C) (Oral)  Wt 239 lb (108.41 kg) Wt Readings from Last 3 Encounters:  04/04/15 239 lb (108.41 kg)  09/04/14 238 lb (107.956 kg)    General: Appears her stated age, well developed, well nourished in NAD. Skin: Warm, dry and intact.  HEENT: Head: normal shape and size;  Nose: mucosa pink and moist, septum midline; Throat/Mouth: Teeth present, mucosa pink and moist, no exudate, lesions or ulcerations noted.  Cardiovascular: Normal rate and rhythm. S1,S2 noted.  No murmur, rubs or gallops noted. No JVD or BLE edema. No carotid bruits noted. Pulmonary/Chest: Normal effort and positive vesicular breath sounds. No respiratory distress. No wheezes, rales or ronchi noted.  Abdomen:  Soft and nontender. Normal bowel sounds, no bruits noted. No distention or masses noted. Liver, spleen and kidneys non palpable. Neurological: Alert and oriented.  Psychiatric: Mood and affect normal. Behavior is normal. Judgment and thought content normal.     BMET    Component Value Date/Time   NA 138 09/04/2014 1028   K 3.9 09/04/2014 1028   CL 102 09/04/2014 1028   CO2 27 09/04/2014 1028   GLUCOSE 107* 09/04/2014 1028   BUN 17 09/04/2014 1028   CREATININE 0.7 09/04/2014 1028   CALCIUM 10.0 09/04/2014 1028    Lipid Panel     Component Value Date/Time   CHOL 156 09/04/2014 1028   TRIG  212.0* 09/04/2014 1028   HDL 40.20 09/04/2014 1028   CHOLHDL 4 09/04/2014 1028   VLDL 42.4* 09/04/2014 1028    CBC    Component Value Date/Time   WBC 7.3 09/04/2014 1028   RBC 4.75 09/04/2014 1028   HGB 13.9 09/04/2014 1028   HCT 42.2 09/04/2014 1028   PLT 295.0 09/04/2014 1028   MCV 88.8 09/04/2014 1028   MCHC 32.9 09/04/2014 1028   RDW 13.7 09/04/2014 1028    Hgb A1C Lab Results  Component Value Date   HGBA1C 6.7* 09/04/2014        Assessment & Plan:   Screening for breast cancer:  Mammogram ordered She will call [facility] to schedule, phone number and address given  RTC in 6 months for wellness exam/6 month followup

## 2015-04-04 NOTE — Assessment & Plan Note (Signed)
Stable on Xanax twice daily CMET today Meds refilled- 90 day supply

## 2015-04-04 NOTE — Assessment & Plan Note (Signed)
Stable on Combivent Continue Albuterol prn Meds refilled- 90 day supply

## 2015-04-04 NOTE — Assessment & Plan Note (Addendum)
Continue Vesicare daily CMET today Discussed timed voiding to reduce the risk of incontinence Meds refilled 90 day supply

## 2015-04-04 NOTE — Assessment & Plan Note (Signed)
Stable on Prilosec and Zantac Continue to avoid foods that make your reflux worse Discussed how weight loss could improve her reflux and maybe allow her to come off some of her meds Meds refilled- 90 day supply Will check CMET today

## 2015-04-04 NOTE — Assessment & Plan Note (Signed)
Diet controlled Will repeat A1C and microalbumin today Discussed the benefits of diet and exercise Encouraged her to go ahead and schedule her eye exam  Flu and Pneumovax UTD Prevnar today Foot exam today

## 2015-04-04 NOTE — Progress Notes (Signed)
Pre visit review using our clinic review tool, if applicable. No additional management support is needed unless otherwise documented below in the visit note. 

## 2015-04-04 NOTE — Assessment & Plan Note (Signed)
Weight has been stable over the last 6 months Encouraged her to work on diet and exercise

## 2015-04-04 NOTE — Patient Instructions (Signed)
Fat and Cholesterol Control Diet Fat and cholesterol levels in your blood and organs are influenced by your diet. High levels of fat and cholesterol may lead to diseases of the heart, small and large blood vessels, gallbladder, liver, and pancreas. CONTROLLING FAT AND CHOLESTEROL WITH DIET Although exercise and lifestyle factors are important, your diet is key. That is because certain foods are known to raise cholesterol and others to lower it. The goal is to balance foods for their effect on cholesterol and more importantly, to replace saturated and trans fat with other types of fat, such as monounsaturated fat, polyunsaturated fat, and omega-3 fatty acids. On average, a person should consume no more than 15 to 17 g of saturated fat daily. Saturated and trans fats are considered "bad" fats, and they will raise LDL cholesterol. Saturated fats are primarily found in animal products such as meats, butter, and cream. However, that does not mean you need to give up all your favorite foods. Today, there are good tasting, low-fat, low-cholesterol substitutes for most of the things you like to eat. Choose low-fat or nonfat alternatives. Choose round or loin cuts of red meat. These types of cuts are lowest in fat and cholesterol. Chicken (without the skin), fish, veal, and ground turkey breast are great choices. Eliminate fatty meats, such as hot dogs and salami. Even shellfish have little or no saturated fat. Have a 3 oz (85 g) portion when you eat lean meat, poultry, or fish. Trans fats are also called "partially hydrogenated oils." They are oils that have been scientifically manipulated so that they are solid at room temperature resulting in a longer shelf life and improved taste and texture of foods in which they are added. Trans fats are found in stick margarine, some tub margarines, cookies, crackers, and baked goods.  When baking and cooking, oils are a great substitute for butter. The monounsaturated oils are  especially beneficial since it is believed they lower LDL and raise HDL. The oils you should avoid entirely are saturated tropical oils, such as coconut and palm.  Remember to eat a lot from food groups that are naturally free of saturated and trans fat, including fish, fruit, vegetables, beans, grains (barley, rice, couscous, bulgur wheat), and pasta (without cream sauces).  IDENTIFYING FOODS THAT LOWER FAT AND CHOLESTEROL  Soluble fiber may lower your cholesterol. This type of fiber is found in fruits such as apples, vegetables such as broccoli, potatoes, and carrots, legumes such as beans, peas, and lentils, and grains such as barley. Foods fortified with plant sterols (phytosterol) may also lower cholesterol. You should eat at least 2 g per day of these foods for a cholesterol lowering effect.  Read package labels to identify low-saturated fats, trans fat free, and low-fat foods at the supermarket. Select cheeses that have only 2 to 3 g saturated fat per ounce. Use a heart-healthy tub margarine that is free of trans fats or partially hydrogenated oil. When buying baked goods (cookies, crackers), avoid partially hydrogenated oils. Breads and muffins should be made from whole grains (whole-wheat or whole oat flour, instead of "flour" or "enriched flour"). Buy non-creamy canned soups with reduced salt and no added fats.  FOOD PREPARATION TECHNIQUES  Never deep-fry. If you must fry, either stir-fry, which uses very little fat, or use non-stick cooking sprays. When possible, broil, bake, or roast meats, and steam vegetables. Instead of putting butter or margarine on vegetables, use lemon and herbs, applesauce, and cinnamon (for squash and sweet potatoes). Use nonfat   yogurt, salsa, and low-fat dressings for salads.  LOW-SATURATED FAT / LOW-FAT FOOD SUBSTITUTES Meats / Saturated Fat (g)  Avoid: Steak, marbled (3 oz/85 g) / 11 g  Choose: Steak, lean (3 oz/85 g) / 4 g  Avoid: Hamburger (3 oz/85 g) / 7  g  Choose: Hamburger, lean (3 oz/85 g) / 5 g  Avoid: Ham (3 oz/85 g) / 6 g  Choose: Ham, lean cut (3 oz/85 g) / 2.4 g  Avoid: Chicken, with skin, dark meat (3 oz/85 g) / 4 g  Choose: Chicken, skin removed, dark meat (3 oz/85 g) / 2 g  Avoid: Chicken, with skin, light meat (3 oz/85 g) / 2.5 g  Choose: Chicken, skin removed, light meat (3 oz/85 g) / 1 g Dairy / Saturated Fat (g)  Avoid: Whole milk (1 cup) / 5 g  Choose: Low-fat milk, 2% (1 cup) / 3 g  Choose: Low-fat milk, 1% (1 cup) / 1.5 g  Choose: Skim milk (1 cup) / 0.3 g  Avoid: Hard cheese (1 oz/28 g) / 6 g  Choose: Skim milk cheese (1 oz/28 g) / 2 to 3 g  Avoid: Cottage cheese, 4% fat (1 cup) / 6.5 g  Choose: Low-fat cottage cheese, 1% fat (1 cup) / 1.5 g  Avoid: Ice cream (1 cup) / 9 g  Choose: Sherbet (1 cup) / 2.5 g  Choose: Nonfat frozen yogurt (1 cup) / 0.3 g  Choose: Frozen fruit bar / trace  Avoid: Whipped cream (1 tbs) / 3.5 g  Choose: Nondairy whipped topping (1 tbs) / 1 g Condiments / Saturated Fat (g)  Avoid: Mayonnaise (1 tbs) / 2 g  Choose: Low-fat mayonnaise (1 tbs) / 1 g  Avoid: Butter (1 tbs) / 7 g  Choose: Extra light margarine (1 tbs) / 1 g  Avoid: Coconut oil (1 tbs) / 11.8 g  Choose: Olive oil (1 tbs) / 1.8 g  Choose: Corn oil (1 tbs) / 1.7 g  Choose: Safflower oil (1 tbs) / 1.2 g  Choose: Sunflower oil (1 tbs) / 1.4 g  Choose: Soybean oil (1 tbs) / 2.4 g  Choose: Canola oil (1 tbs) / 1 g Document Released: 12/08/2005 Document Revised: 04/04/2013 Document Reviewed: 03/08/2014 Exit[employer] Patient Information 2015 [employer]. This information is not intended to replace advice given to you by your health care provider. Make sure you discuss any questions you have with your health care provider.  

## 2015-04-04 NOTE — Assessment & Plan Note (Signed)
Well controlled on Tribenzor and Clonidine Will check CBC and CMET today Will refill meds with 90 day supply

## 2015-04-04 NOTE — Assessment & Plan Note (Addendum)
LDL at goal on Pravastatin Will check CMET and Lipid Profile today Continue to consume a low fat diet Meds refilled- 90 day supply

## 2015-04-06 MED ORDER — VITAMIN D (ERGOCALCIFEROL) 1.25 MG (50000 UNIT) PO CAPS: 50000.0000 [IU] | ORAL_CAPSULE | ORAL | Status: DC

## 2015-04-06 NOTE — Addendum Note (Signed)
Addended by: Lurlean Nanny on: 04/06/2015 03:06 PM   Modules accepted: Orders

## 2015-04-10 DIAGNOSIS — Z7689 Persons encountering health services in other specified circumstances: Secondary | ICD-10-CM

## 2015-04-14 ENCOUNTER — Other Ambulatory Visit: Payer: Self-pay | Admitting: Internal Medicine

## 2015-04-14 NOTE — Op Note (Signed)
PATIENT NAME:  Tiffany Arnold, Tiffany Arnold MR#:  [id number] DATE OF BIRTH:  [birth year]  DATE OF PROCEDURE:  04/06/2014  PREOPERATIVE DIAGNOSIS: Umbilical hernia.   POSTOPERATIVE DIAGNOSIS: Umbilical hernia.   PROCEDURE: Umbilical hernia repair.   SURGEON: Rochel Brome, M.D.   ANESTHESIA: General.   INDICATIONS: This 74 year old female has had a gradually an enlarging bulge at her navel. She is having just minimal discomfort. The hernia was demonstrated on physical exam and was found to be some 6 cm in dimension and partially reducible. It was chronically incarcerated. Surgery was recommended for definitive treatment.   DESCRIPTION OF PROCEDURE: The patient was placed on the operating table in the supine position under general anesthesia. The abdomen was prepared with ChloraPrep and draped in a sterile manner. A supraumbilical transversely oriented curvilinear incision was made some 4 cm in length, carried down through subcutaneous tissues to encounter an umbilical hernia sac, which was dissected free from surrounding structures and followed down to a fascial ring defect. The defect was approximately 1.5 cm in dimension. The hernia appeared to contained omentum and was incarcerated. The fascial defect was enlarged bilaterally by about 6 mm on each side and this allowed further manipulation and reduction of the hernia. The sac was inverted. The properitoneal fat was dissected away from the fascia circumferentially. A Bard soft mesh was cut to create an oval shape of some 2.5 x 3 cm and placed into the properitoneal plane and was sutured to the fascia with through and through 0 Surgilon sutures. Next, the fascia was closed with a transversely oriented suture line of interrupted 0 Surgilon figure-of-eight sutures incorporating mesh into each suture. The repair looked good. Hemostasis was intact. It is noted that a number of small bleeding points were cauterized during the course of the procedure. The skin of the  umbilicus was tacked to the deep fascia with 5-0 Monocryl. The skin was closed with running 5-0 Monocryl subcuticular suture and Dermabond. The patient tolerated surgery satisfactorily and was then prepared for transfer to the recovery room.  ____________________________ Lenna Sciara. Rochel Brome, MD jws:aw D: 04/06/2014 08:40:10 ET T: 04/06/2014 08:53:46 ET JOB#: [id number]  cc: Loreli Dollar, MD, <Dictator> Loreli Dollar MD ELECTRONICALLY SIGNED 04/11/2014 16:05

## 2015-04-20 ENCOUNTER — Telehealth: Payer: Self-pay

## 2015-04-20 NOTE — Telephone Encounter (Signed)
Spoke to pt and she is aware handicap placard form is ready for pick up--form placed in front office for pick up and copy sent to be scanned

## 2015-04-27 ENCOUNTER — Other Ambulatory Visit: Payer: Self-pay | Admitting: Internal Medicine

## 2015-05-03 ENCOUNTER — Ambulatory Visit
Admission: RE | Admit: 2015-05-03 | Discharge: 2015-05-03 | Disposition: A | Discharge status: Home / Self Care | Payer: Medicare PPO | Source: Ambulatory Visit | Attending: Internal Medicine | Admitting: Internal Medicine

## 2015-05-03 ENCOUNTER — Other Ambulatory Visit: Payer: Self-pay | Admitting: Internal Medicine

## 2015-05-03 DIAGNOSIS — Z1239 Encounter for other screening for malignant neoplasm of breast: Secondary | ICD-10-CM

## 2015-05-03 DIAGNOSIS — Z1231 Encounter for screening mammogram for malignant neoplasm of breast: Secondary | ICD-10-CM | POA: Diagnosis present

## 2015-05-07 ENCOUNTER — Other Ambulatory Visit: Payer: Self-pay | Admitting: Internal Medicine

## 2015-06-01 ENCOUNTER — Telehealth: Payer: Self-pay | Admitting: *Deleted

## 2015-06-01 NOTE — Telephone Encounter (Signed)
Patient has mammogram report documented in EPIC.

## 2015-06-04 ENCOUNTER — Other Ambulatory Visit: Payer: Self-pay | Admitting: Internal Medicine

## 2015-06-28 DIAGNOSIS — Z96651 Presence of right artificial knee joint: Secondary | ICD-10-CM | POA: Diagnosis not present

## 2015-06-28 DIAGNOSIS — Z96612 Presence of left artificial shoulder joint: Secondary | ICD-10-CM | POA: Diagnosis not present

## 2015-06-28 DIAGNOSIS — Z96652 Presence of left artificial knee joint: Secondary | ICD-10-CM | POA: Diagnosis not present

## 2015-06-29 DIAGNOSIS — Z96652 Presence of left artificial knee joint: Secondary | ICD-10-CM | POA: Insufficient documentation

## 2015-07-03 DIAGNOSIS — G4733 Obstructive sleep apnea (adult) (pediatric): Secondary | ICD-10-CM | POA: Diagnosis not present

## 2015-07-17 ENCOUNTER — Other Ambulatory Visit: Payer: Self-pay | Admitting: Internal Medicine

## 2015-09-06 DIAGNOSIS — G4733 Obstructive sleep apnea (adult) (pediatric): Secondary | ICD-10-CM | POA: Diagnosis not present

## 2015-10-10 ENCOUNTER — Encounter: Payer: Self-pay | Admitting: Internal Medicine

## 2015-10-10 ENCOUNTER — Ambulatory Visit (INDEPENDENT_AMBULATORY_CARE_PROVIDER_SITE_OTHER): Payer: Medicare PPO | Admitting: Internal Medicine

## 2015-10-10 ENCOUNTER — Ambulatory Visit: Payer: Medicare PPO | Admitting: Internal Medicine

## 2015-10-10 VITALS — BP 128/82 | HR 60 | Temp 98.0°F | Wt 250.0 lb

## 2015-10-10 DIAGNOSIS — Z79899 Other long term (current) drug therapy: Secondary | ICD-10-CM | POA: Diagnosis not present

## 2015-10-10 DIAGNOSIS — R0602 Shortness of breath: Secondary | ICD-10-CM

## 2015-10-10 DIAGNOSIS — E785 Hyperlipidemia, unspecified: Secondary | ICD-10-CM

## 2015-10-10 DIAGNOSIS — F411 Generalized anxiety disorder: Secondary | ICD-10-CM

## 2015-10-10 DIAGNOSIS — J302 Other seasonal allergic rhinitis: Secondary | ICD-10-CM | POA: Diagnosis not present

## 2015-10-10 DIAGNOSIS — G4730 Sleep apnea, unspecified: Secondary | ICD-10-CM | POA: Insufficient documentation

## 2015-10-10 DIAGNOSIS — Z23 Encounter for immunization: Secondary | ICD-10-CM

## 2015-10-10 DIAGNOSIS — N3941 Urge incontinence: Secondary | ICD-10-CM

## 2015-10-10 DIAGNOSIS — J4530 Mild persistent asthma, uncomplicated: Secondary | ICD-10-CM | POA: Diagnosis not present

## 2015-10-10 DIAGNOSIS — I10 Essential (primary) hypertension: Secondary | ICD-10-CM | POA: Diagnosis not present

## 2015-10-10 DIAGNOSIS — K219 Gastro-esophageal reflux disease without esophagitis: Secondary | ICD-10-CM

## 2015-10-10 DIAGNOSIS — E119 Type 2 diabetes mellitus without complications: Secondary | ICD-10-CM

## 2015-10-10 DIAGNOSIS — E669 Obesity, unspecified: Secondary | ICD-10-CM | POA: Insufficient documentation

## 2015-10-10 LAB — LIPID PANEL
CHOL/HDL RATIO: 4
Cholesterol: 182 mg/dL (ref 0–200)
HDL: 49.2 mg/dL (ref >39.00)
LDL Cholesterol: 100 mg/dL — ABNORMAL HIGH (ref 0–99)
NonHDL: 132.55
Triglycerides: 162 mg/dL — ABNORMAL HIGH (ref 0.0–149.0)
VLDL: 32.4 mg/dL (ref 0.0–40.0)

## 2015-10-10 LAB — CBC
HCT: 43.3 % (ref 36.0–46.0)
Hemoglobin: 14 g/dL (ref 12.0–15.0)
MCHC: 32.3 g/dL (ref 30.0–36.0)
MCV: 88.6 fl (ref 78.0–100.0)
Platelets: 301 10*3/uL (ref 150.0–400.0)
RBC: 4.89 Mil/uL (ref 3.87–5.11)
RDW: 13.6 % (ref 11.5–15.5)
WBC: 7.2 10*3/uL (ref 4.0–10.5)

## 2015-10-10 LAB — COMPREHENSIVE METABOLIC PANEL
ALT: 16 U/L (ref 0–35)
AST: 16 U/L (ref 0–37)
Albumin: 3.8 g/dL (ref 3.5–5.2)
Alkaline Phosphatase: 58 U/L (ref 39–117)
BILIRUBIN TOTAL: 0.5 mg/dL (ref 0.2–1.2)
BUN: 24 mg/dL — AB (ref 6–23)
CO2: 34 meq/L — AB (ref 19–32)
Calcium: 10.2 mg/dL (ref 8.4–10.5)
Chloride: 101 mEq/L (ref 96–112)
Creatinine, Ser: 0.73 mg/dL (ref 0.40–1.20)
GFR: 83.32 mL/min (ref >60.00)
GLUCOSE: 111 mg/dL — AB (ref 70–99)
Potassium: 4.2 mEq/L (ref 3.5–5.1)
SODIUM: 140 meq/L (ref 135–145)
Total Protein: 7.1 g/dL (ref 6.0–8.3)

## 2015-10-10 LAB — BRAIN NATRIURETIC PEPTIDE: Pro B Natriuretic peptide (BNP): 27 pg/mL (ref 0.0–100.0)

## 2015-10-10 LAB — HEMOGLOBIN A1C: HEMOGLOBIN A1C: 7.2 % — AB (ref 4.6–6.5)

## 2015-10-10 NOTE — Assessment & Plan Note (Addendum)
Discussed how weight loss would improve her reflux Advised her to avoid triggers Will try to cut down on the Prilosec to 20 mg  Continue Zantac prn

## 2015-10-10 NOTE — Progress Notes (Signed)
Subjective:    Patient ID: Tiffany Arnold, female    DOB: 1941-10-24, 74 y.o.   MRN: [id number]  HPI  Pt presents to the clinic today for 6 month follow up of chronic conditions.  Asthma: Mild, persistent. She takes Combivent daily. Rare Albuterol use.  HTN: BP well controlled on Tribenzor and Clonidine. Her BP today is 128/82.  GERD: She denies breakthrough symptoms on Prilosec and Zantac. She tries to avoid foods that make her reflux worse. She is complaining of some symptoms of "food getting stuck" when she swallows. She has not choked on her food.  GAD: Her anxiety is well controlled on Xanax twice daily. She denies depression, SI/HI.  HLD: Last LDL 105. She denies myalgias on Pravastatin. She does try to consume a low fat diet.  Seasonal allergies: Worse in the spring. She takes Zyrtec daily.  Obesity: Her BMI is 47.26. She has gained 11 lbs in the last 6 months.   Urge Incontinence: She takes Vesicare daily. It has helped a little with her incontinence.  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 She does not take any medication for this. Her diabetes is controlled with diet. She does not test her sugars. Her last eye exam was last year. Flu- 08/2014 Pneumovax- 01/2012. Prevnar- 03/2015.     Review of Systems      Past Medical History  Diagnosis Date  . Asthma   . Arthritis   . Chicken pox   . Allergy   . Hypertension   . Hyperlipidemia   . Colon polyps   . Phlebitis     Current Outpatient Prescriptions  Medication Sig Dispense Refill  . albuterol (PROAIR HFA) 108 (90 BASE) MCG/ACT inhaler Inhale 2 puffs into the lungs every 6 (six) hours as needed.    Marland Kitchen albuterol-ipratropium (COMBIVENT) 18-103 MCG/ACT inhaler Inhale 2 puffs into the lungs every 4 (four) hours.    . ALPRAZolam (XANAX) 0.25 MG tablet Take 0.25 mg by mouth 2 (two) times daily as needed.    Marland Kitchen aspirin EC 81 MG tablet Take 81 mg by mouth daily.    . Calcium Carb-Ergocalciferol 500-200 MG-UNIT TABS Take 1 tablet by  mouth 2 (two) times daily.    . cetirizine (ZYRTEC) 10 MG tablet Take 10 mg by mouth daily.    . cloNIDine (CATAPRES) 0.1 MG tablet TAKE ONE TABLET BY MOUTH TWICE DAILY 60 tablet 3  . EPINEPHrine 0.3 mg/0.3 mL IJ SOAJ injection Inject 0.3 mg into the muscle as needed.    . naproxen sodium (ALEVE) 220 MG tablet Take 2 tablets by mouth as needed.    Marland Kitchen omeprazole (PRILOSEC) 40 MG capsule Take 40 mg by mouth daily.    . polyethylene glycol powder (GLYCOLAX/MIRALAX) powder Take 17 g by mouth as needed.    . pravastatin (PRAVACHOL) 40 MG tablet TAKE ONE TABLET BY MOUTH ONCE DAILY 30 tablet 5  . ranitidine (ZANTAC) 150 MG tablet Take 150 mg by mouth daily.    Jabier Gauss 40-5-25 MG TABS TAKE ONE TABLET BY MOUTH ONCE DAILY 90 tablet 1  . Tuberculin-Allergy Syringes 27G X 1/2" 1 ML KIT     . VESICARE 10 MG tablet TAKE ONE TABLET BY MOUTH ONCE DAILY 30 tablet 5  . Vitamin D, Ergocalciferol, (DRISDOL) 50000 UNITS CAPS capsule Take 1 capsule (50,000 Units total) by mouth every 7 (seven) days. 12 capsule 0   No current facility-administered medications for this visit.    Allergies  Allergen Reactions  .  Other Swelling    Cats, dogs, mold    Family History  Problem Relation Age of Onset  . Arthritis Mother   . Stroke Mother   . Hypertension Mother   . Cancer Father     Prostate  . Stroke Father   . Hypertension Father   . Hypertension Maternal Grandmother   . Rheum arthritis Maternal Grandfather   . Stroke Maternal Grandfather   . Hypertension Maternal Grandfather   . Cancer Paternal Grandmother     Colon  . Hypertension Paternal Grandmother   . Hypertension Paternal Grandfather     Social History   Social History  . Marital Status: Married    Spouse Name: N/A  . Number of Children: N/A  . Years of Education: N/A   Occupational History  . Not on file.   Social History Main Topics  . Smoking status: Former Research scientist (life sciences)  . Smokeless tobacco: Not on file     Comment: quit 22 years  ago  . Alcohol Use: Yes     Comment: rare  . Drug Use: No  . Sexual Activity: Not Currently   Other Topics Concern  . Not on file   Social History Narrative     Constitutional: Pt reports weight gain. Denies fever, malaise, fatigue, headache.  HEENT: Denies eye pain, eye redness, ear pain, ringing in the ears, wax buildup, runny nose, nasal congestion, bloody nose, or sore throat. Respiratory: Pt reports shortness of breath with exertion. Denies difficulty breathing, shortness of breath, cough.   Cardiovascular: Pt reports swelling in her legs. Denies chest pain, chest tightness, palpitations or swelling in the hands.  Gastrointestinal: Denies abdominal pain, bloating, constipation, diarrhea or blood in the stool.  GU: Pt reports urinary incontinence. Denies frequency, pain with urination, burning sensation, blood in urine, odor or discharge. Skin: Denies redness, rashes, lesions or ulcercations.  Neurological: Denies dizziness, difficulty with memory, difficulty with speech or problems with balance and coordination.  Psych: Denies anxiety, depression, SI/HI.  No other specific complaints in a complete review of systems (except as listed in HPI above).  Objective:   Physical Exam   BP 128/82 mmHg  Pulse 60  Temp(Src) 98 F (36.7 C) (Oral)  Wt 250 lb (113.399 kg)  SpO2 97%  Wt Readings from Last 3 Encounters:  04/04/15 239 lb (108.41 kg)  09/04/14 238 lb (107.956 kg)    General: Appears her stated age, obese in NAD. Skin: Warm, dry and intact.  Cardiovascular: Normal rate and rhythm. S1,S2 noted.  No murmur, rubs or gallops noted. 2+ pitting BLE edema. No carotid bruits noted. Pulmonary/Chest: Normal effort and positive vesicular breath sounds. No respiratory distress. No wheezes, rales or ronchi noted.  Abdomen: Soft and nontender. Normal bowel sounds. Neurological: Alert and oriented.  Psychiatric: Mood and affect normal. Behavior is normal. Judgment and thought  content normal.     BMET    Component Value Date/Time   NA 138 04/04/2015 1440   K 4.3 04/04/2015 1440   CL 101 04/04/2015 1440   CO2 32 04/04/2015 1440   GLUCOSE 89 04/04/2015 1440   BUN 20 04/04/2015 1440   CREATININE 0.64 04/04/2015 1440   CALCIUM 10.4 04/04/2015 1440    Lipid Panel     Component Value Date/Time   CHOL 186 04/04/2015 1440   TRIG 201.0* 04/04/2015 1440   HDL 56.40 04/04/2015 1440   CHOLHDL 3 04/04/2015 1440   VLDL 40.2* 04/04/2015 1440    CBC    Component Value  Date/Time   WBC 8.4 04/04/2015 1440   RBC 4.82 04/04/2015 1440   HGB 14.1 04/04/2015 1440   HCT 41.6 04/04/2015 1440   PLT 310.0 04/04/2015 1440   MCV 86.4 04/04/2015 1440   MCHC 33.9 04/04/2015 1440   RDW 13.9 04/04/2015 1440    Hgb A1C Lab Results  Component Value Date   HGBA1C 6.9* 04/04/2015        Assessment & Plan:    RTC in 6 months for wellness exam/6 month followup

## 2015-10-10 NOTE — Assessment & Plan Note (Signed)
Encouraged her to work on diet and exercise 

## 2015-10-10 NOTE — Assessment & Plan Note (Signed)
Stable on Combivent She has Albuterol to take prn Flu shot today Pneumonia vaccines UTD

## 2015-10-10 NOTE — Progress Notes (Signed)
Pre visit review using our clinic review tool, if applicable. No additional management support is needed unless otherwise documented below in the visit note. 

## 2015-10-10 NOTE — Assessment & Plan Note (Signed)
Continue Vesicare daily

## 2015-10-10 NOTE — Assessment & Plan Note (Signed)
No issues on daily Zyrtec

## 2015-10-10 NOTE — Assessment & Plan Note (Signed)
Well controlled on Tribenzor and Clonidine Will check CBC and CMET today

## 2015-10-10 NOTE — Patient Instructions (Signed)

## 2015-10-10 NOTE — Assessment & Plan Note (Signed)
Stable on Xanax BID Will obtain CSA and UDS today

## 2015-10-10 NOTE — Assessment & Plan Note (Signed)
Will check CMET and Lipid Profile today Will adjust Pravastatin if LDL not at goal Encouraged her to consume a low fat diet

## 2015-10-10 NOTE — Addendum Note (Signed)
Addended by: Lurlean Nanny on: 10/10/2015 04:50 PM   Modules accepted: Orders

## 2015-10-10 NOTE — Assessment & Plan Note (Addendum)
Will check A1C today No microalbumin d/t ARB therapy Flu shot today Pneumonia vaccines UTD Foot exam today Encouraged her to consume a low carb diet

## 2015-10-13 ENCOUNTER — Other Ambulatory Visit: Payer: Self-pay | Admitting: Internal Medicine

## 2015-10-26 ENCOUNTER — Encounter: Payer: Self-pay | Admitting: Internal Medicine

## 2015-10-29 ENCOUNTER — Other Ambulatory Visit: Payer: Self-pay | Admitting: Internal Medicine

## 2015-11-13 ENCOUNTER — Other Ambulatory Visit: Payer: Self-pay | Admitting: Internal Medicine

## 2015-11-22 ENCOUNTER — Telehealth: Payer: Self-pay | Admitting: Internal Medicine

## 2015-11-22 NOTE — Telephone Encounter (Signed)
Left message asking pt to call office Pt needs cpx before end of year per linda t

## 2015-11-28 ENCOUNTER — Other Ambulatory Visit: Payer: Self-pay | Admitting: Internal Medicine

## 2015-11-28 NOTE — Telephone Encounter (Signed)
left message asking pt to call office 12/7/rbh

## 2015-12-03 ENCOUNTER — Encounter: Payer: Medicare PPO | Admitting: Internal Medicine

## 2015-12-04 ENCOUNTER — Ambulatory Visit (INDEPENDENT_AMBULATORY_CARE_PROVIDER_SITE_OTHER): Payer: Medicare PPO | Admitting: Internal Medicine

## 2015-12-04 ENCOUNTER — Encounter: Payer: Self-pay | Admitting: Internal Medicine

## 2015-12-04 VITALS — BP 128/78 | HR 64 | Temp 98.2°F | Ht 61.0 in | Wt 248.0 lb

## 2015-12-04 DIAGNOSIS — Z0000 Encounter for general adult medical examination without abnormal findings: Secondary | ICD-10-CM

## 2015-12-04 NOTE — Progress Notes (Signed)
HPI:  Pt presents to the clinic today for Medicare Wellness exam. She had a followup of chronic conditions 10/10/15.  Past Medical History  Diagnosis Date  . Asthma   . Arthritis   . Chicken pox   . Allergy   . Hypertension   . Hyperlipidemia   . Colon polyps   . Phlebitis     Current Outpatient Prescriptions  Medication Sig Dispense Refill  . albuterol (PROAIR HFA) 108 (90 BASE) MCG/ACT inhaler Inhale 2 puffs into the lungs every 6 (six) hours as needed.    Marland Kitchen albuterol-ipratropium (COMBIVENT) 18-103 MCG/ACT inhaler Inhale 2 puffs into the lungs every 4 (four) hours.    . ALPRAZolam (XANAX) 0.25 MG tablet Take 0.25 mg by mouth 2 (two) times daily as needed.    Marland Kitchen aspirin EC 81 MG tablet Take 81 mg by mouth daily.    . Calcium Carb-Ergocalciferol 500-200 MG-UNIT TABS Take 1 tablet by mouth 2 (two) times daily.    . cetirizine (ZYRTEC) 10 MG tablet Take 10 mg by mouth daily.    . cloNIDine (CATAPRES) 0.1 MG tablet TAKE ONE TABLET BY MOUTH TWICE DAILY 60 tablet 3  . EPINEPHrine 0.3 mg/0.3 mL IJ SOAJ injection Inject 0.3 mg into the muscle as needed.    . naproxen sodium (ALEVE) 220 MG tablet Take 2 tablets by mouth as needed.    . Olmesartan-Amlodipine-HCTZ 40-5-25 MG TABS TAKE ONE TABLET BY MOUTH ONCE DAILY 90 tablet 1  . omeprazole (PRILOSEC) 40 MG capsule Take 40 mg by mouth daily.    . polyethylene glycol powder (GLYCOLAX/MIRALAX) powder Take 17 g by mouth as needed.    . pravastatin (PRAVACHOL) 40 MG tablet TAKE ONE TABLET BY MOUTH ONCE DAILY 30 tablet 5  . ranitidine (ZANTAC) 150 MG tablet Take 150 mg by mouth daily.    . Tuberculin-Allergy Syringes 27G X 1/2" 1 ML KIT     . VESICARE 10 MG tablet TAKE ONE TABLET BY MOUTH ONCE DAILY 30 tablet 5   No current facility-administered medications for this visit.    Allergies  Allergen Reactions  . Other Swelling    Cats, dogs, mold    Family History  Problem Relation Age of Onset  . Arthritis Mother   . Stroke Mother   .  Hypertension Mother   . Cancer Father     Prostate  . Stroke Father   . Hypertension Father   . Hypertension Maternal Grandmother   . Rheum arthritis Maternal Grandfather   . Stroke Maternal Grandfather   . Hypertension Maternal Grandfather   . Cancer Paternal Grandmother     Colon  . Hypertension Paternal Grandmother   . Hypertension Paternal Grandfather     Social History   Social History  . Marital Status: Married    Spouse Name: N/A  . Number of Children: N/A  . Years of Education: N/A   Occupational History  . Not on file.   Social History Main Topics  . Smoking status: Former Research scientist (life sciences)  . Smokeless tobacco: Not on file     Comment: quit 22 years ago  . Alcohol Use: Yes     Comment: rare  . Drug Use: No  . Sexual Activity: Not Currently   Other Topics Concern  . Not on file   Social History Narrative    Hospitiliaztions: None  Health Maintenance:    Flu: 09/2015  Tetanus: 06/2014  Pneumovax: 03/2015  Prevnar: 01/2012  Zostavax: she has had it, but not sure  of the date  Mammogram: 04/2015  Pap Smear: 12/2013  Bone Density: 2014  Colon Screening: 2015  Eye Doctor: annually  Dental Exam: biannually   Providers:   PCP: [name], NP-C  Opthalmologist: Dr. Glennon Mac, [facility]  Gastroenterologist: Dr. Aleen Sells     I have personally reviewed and have noted:  1. The patient's medical and social history 2. Their use of alcohol, tobacco or illicit drugs 3. Their current medications and supplements 4. The patient's functional ability including ADL's, fall risks, home safety  risks and hearing or visual impairment. 5. Diet and physical activities 6. Evidence for depression or mood disorder  Subjective:   Review of Systems:   Constitutional: Denies fever, malaise, fatigue, headache or abrupt weight changes.  Respiratory: Denies difficulty breathing, shortness of breath, cough or sputum production.   Cardiovascular: Denies chest pain, chest  tightness, palpitations or swelling in the hands or feet.  Neurological: Denies dizziness, difficulty with memory, difficulty with speech or problems with balance and coordination.  Psych: Denies anxiety, depression, SI/HI.  No other specific complaints in a complete review of systems (except as listed in HPI above).  Objective:  PE:   BP 128/78 mmHg  Pulse 64  Temp(Src) 98.2 F (36.8 C) (Oral)  Ht 5' 1"  (1.549 m)  Wt 248 lb (112.492 kg)  BMI 46.88 kg/m2  SpO2 98% Wt Readings from Last 3 Encounters:  12/04/15 248 lb (112.492 kg)  10/10/15 250 lb (113.399 kg)  04/04/15 239 lb (108.41 kg)    General: Appears her stated age, obese in NAD. Cardiovascular: Normal rate and rhythm. S1,S2 noted.  No murmur, rubs or gallops noted.  Pulmonary/Chest: Normal effort and positive vesicular breath sounds. No respiratory distress. No wheezes, rales or ronchi noted.  Neurological: Alert and oriented.  Psychiatric: Mood and affect normal. Behavior is normal. Judgment and thought content normal.     BMET    Component Value Date/Time   NA 140 10/10/2015 1447   K 4.2 10/10/2015 1447   CL 101 10/10/2015 1447   CO2 34* 10/10/2015 1447   GLUCOSE 111* 10/10/2015 1447   BUN 24* 10/10/2015 1447   CREATININE 0.73 10/10/2015 1447   CALCIUM 10.2 10/10/2015 1447    Lipid Panel     Component Value Date/Time   CHOL 182 10/10/2015 1447   TRIG 162.0* 10/10/2015 1447   HDL 49.20 10/10/2015 1447   CHOLHDL 4 10/10/2015 1447   VLDL 32.4 10/10/2015 1447   LDLCALC 100* 10/10/2015 1447    CBC    Component Value Date/Time   WBC 7.2 10/10/2015 1447   RBC 4.89 10/10/2015 1447   HGB 14.0 10/10/2015 1447   HCT 43.3 10/10/2015 1447   PLT 301.0 10/10/2015 1447   MCV 88.6 10/10/2015 1447   MCHC 32.3 10/10/2015 1447   RDW 13.6 10/10/2015 1447    Hgb A1C Lab Results  Component Value Date   HGBA1C 7.2* 10/10/2015      Assessment and Plan:   Medicare Annual Wellness Visit:  Diet: She does  not adhere to any specific diet  Physical activity: Sedentary Depression/mood screen: Negative Hearing: Intact to whispered voice Visual acuity: Grossly normal, performs annual eye exam  ADLs: Capable Fall risk: None Home safety: Good Cognitive evaluation: Intact to orientation, naming, recall and repetition EOL planning: Adv directives, full code/ I agree  Preventative Medicine:  All HM UTD Will request immunization record from prior PCP, medical record release signed  Next appointment: 4 months follow up chronic conditions

## 2015-12-04 NOTE — Patient Instructions (Signed)

## 2015-12-04 NOTE — Progress Notes (Signed)
Pre visit review using our clinic review tool, if applicable. No additional management support is needed unless otherwise documented below in the visit note. 

## 2016-01-14 ENCOUNTER — Telehealth: Payer: Self-pay

## 2016-01-14 NOTE — Telephone Encounter (Signed)
Alanson Puls pts husband(no DPR) walked in requesting rx for aricept. Do not see on current or hx med list and pt had annual medicare wellness on 12/04/15 but not mentioned in visit; Joneen Caraway said pt thinks she should be taking aricept 10 mg to [facility] [address].Joneen Caraway request cb when refilled.Please advise.

## 2016-01-14 NOTE — Telephone Encounter (Signed)
She has no history of dementia and did not have any memory issues at her last visit. If they want, they can make a 30 minute follow up to discuss and for memory testing.

## 2016-01-14 NOTE — Telephone Encounter (Signed)
Pt has appt scheduled for 01/16/2016 at 3:45

## 2016-01-16 ENCOUNTER — Ambulatory Visit: Payer: Medicare PPO | Admitting: Internal Medicine

## 2016-01-17 ENCOUNTER — Other Ambulatory Visit: Payer: Self-pay

## 2016-01-17 MED ORDER — ALPRAZOLAM 0.25 MG PO TABS: 0.2500 mg | ORAL_TABLET | Freq: Two times a day (BID) | ORAL | Status: DC | PRN

## 2016-01-17 NOTE — Telephone Encounter (Signed)
Pt walked in requesting alprazolam refill to [facility] [address]. Dr Doy Hutching Her previous physician had prescribed last; R Baity NP has not prescribed yet. Pt last seen 12/04/15 for med wellness.pt has been out of med one week. Pt request cb when refilled.

## 2016-01-17 NOTE — Telephone Encounter (Signed)
Ok to phone in Xanax. Do we CSA/UDS on file?

## 2016-01-18 DIAGNOSIS — J309 Allergic rhinitis, unspecified: Secondary | ICD-10-CM | POA: Diagnosis not present

## 2016-01-18 NOTE — Telephone Encounter (Signed)
Yes she did Oct 2016 and you said every 6 months--not due---Rx called into pharmacy

## 2016-01-31 ENCOUNTER — Telehealth: Payer: Self-pay

## 2016-01-31 NOTE — Telephone Encounter (Signed)
Pt request refill allergy syringes; pt sees allergist and will contact allergist office for refill of allergy vaccine and syringes. Pt voiced understanding.

## 2016-02-13 ENCOUNTER — Other Ambulatory Visit: Payer: Self-pay | Admitting: Internal Medicine

## 2016-02-13 NOTE — Telephone Encounter (Signed)
Last filled 11/26/16--please advise 

## 2016-02-13 NOTE — Telephone Encounter (Signed)
Ok to phone in to be filled on or after 2/26

## 2016-02-15 ENCOUNTER — Other Ambulatory Visit: Payer: Self-pay | Admitting: Internal Medicine

## 2016-02-15 NOTE — Telephone Encounter (Signed)
Rx called into pharmacy as instructed 

## 2016-03-19 ENCOUNTER — Other Ambulatory Visit: Payer: Self-pay | Admitting: Internal Medicine

## 2016-03-19 NOTE — Telephone Encounter (Signed)
Last filled 02/13/2016--pt has upcoming appt in April--please advise

## 2016-03-19 NOTE — Telephone Encounter (Signed)
Ok to phone in Xanax 

## 2016-03-20 ENCOUNTER — Other Ambulatory Visit: Payer: Self-pay | Admitting: Internal Medicine

## 2016-03-20 DIAGNOSIS — I10 Essential (primary) hypertension: Secondary | ICD-10-CM | POA: Diagnosis not present

## 2016-03-20 DIAGNOSIS — N3281 Overactive bladder: Secondary | ICD-10-CM | POA: Diagnosis not present

## 2016-03-20 DIAGNOSIS — Z6841 Body Mass Index (BMI) 40.0 and over, adult: Secondary | ICD-10-CM | POA: Diagnosis not present

## 2016-03-20 DIAGNOSIS — G4733 Obstructive sleep apnea (adult) (pediatric): Secondary | ICD-10-CM | POA: Diagnosis not present

## 2016-03-20 DIAGNOSIS — F419 Anxiety disorder, unspecified: Secondary | ICD-10-CM | POA: Diagnosis not present

## 2016-03-20 DIAGNOSIS — H547 Unspecified visual loss: Secondary | ICD-10-CM | POA: Diagnosis not present

## 2016-03-20 DIAGNOSIS — E785 Hyperlipidemia, unspecified: Secondary | ICD-10-CM | POA: Diagnosis not present

## 2016-03-20 DIAGNOSIS — K219 Gastro-esophageal reflux disease without esophagitis: Secondary | ICD-10-CM | POA: Diagnosis not present

## 2016-03-20 NOTE — Telephone Encounter (Signed)
Rx called in to pharmacy. 

## 2016-04-02 ENCOUNTER — Encounter: Payer: Self-pay | Admitting: Internal Medicine

## 2016-04-02 ENCOUNTER — Ambulatory Visit (INDEPENDENT_AMBULATORY_CARE_PROVIDER_SITE_OTHER): Payer: Medicare PPO | Admitting: Internal Medicine

## 2016-04-02 VITALS — BP 112/66 | HR 71 | Temp 98.5°F | Wt 248.5 lb

## 2016-04-02 DIAGNOSIS — E119 Type 2 diabetes mellitus without complications: Secondary | ICD-10-CM | POA: Diagnosis not present

## 2016-04-02 DIAGNOSIS — K219 Gastro-esophageal reflux disease without esophagitis: Secondary | ICD-10-CM

## 2016-04-02 DIAGNOSIS — N3941 Urge incontinence: Secondary | ICD-10-CM

## 2016-04-02 DIAGNOSIS — E785 Hyperlipidemia, unspecified: Secondary | ICD-10-CM

## 2016-04-02 DIAGNOSIS — I10 Essential (primary) hypertension: Secondary | ICD-10-CM

## 2016-04-02 DIAGNOSIS — J302 Other seasonal allergic rhinitis: Secondary | ICD-10-CM

## 2016-04-02 DIAGNOSIS — F411 Generalized anxiety disorder: Secondary | ICD-10-CM

## 2016-04-02 DIAGNOSIS — J4530 Mild persistent asthma, uncomplicated: Secondary | ICD-10-CM

## 2016-04-02 LAB — COMPREHENSIVE METABOLIC PANEL
ALK PHOS: 68 U/L (ref 39–117)
ALT: 16 U/L (ref 0–35)
AST: 13 U/L (ref 0–37)
Albumin: 4 g/dL (ref 3.5–5.2)
BILIRUBIN TOTAL: 0.5 mg/dL (ref 0.2–1.2)
BUN: 22 mg/dL (ref 6–23)
CALCIUM: 10.1 mg/dL (ref 8.4–10.5)
CO2: 35 mEq/L — ABNORMAL HIGH (ref 19–32)
Chloride: 100 mEq/L (ref 96–112)
Creatinine, Ser: 0.8 mg/dL (ref 0.40–1.20)
GFR: 74.86 mL/min (ref >60.00)
Glucose, Bld: 170 mg/dL — ABNORMAL HIGH (ref 70–99)
POTASSIUM: 3.8 meq/L (ref 3.5–5.1)
Sodium: 138 mEq/L (ref 135–145)
Total Protein: 7.1 g/dL (ref 6.0–8.3)

## 2016-04-02 LAB — CBC
HCT: 41.6 % (ref 36.0–46.0)
HEMOGLOBIN: 13.7 g/dL (ref 12.0–15.0)
MCHC: 32.9 g/dL (ref 30.0–36.0)
MCV: 86.4 fl (ref 78.0–100.0)
PLATELETS: 310 10*3/uL (ref 150.0–400.0)
RBC: 4.81 Mil/uL (ref 3.87–5.11)
RDW: 13.9 % (ref 11.5–15.5)
WBC: 8.3 10*3/uL (ref 4.0–10.5)

## 2016-04-02 LAB — LIPID PANEL
Cholesterol: 186 mg/dL (ref 0–200)
HDL: 46.9 mg/dL
NonHDL: 139.52
Total CHOL/HDL Ratio: 4
Triglycerides: 234 mg/dL — ABNORMAL HIGH (ref 0.0–149.0)
VLDL: 46.8 mg/dL — ABNORMAL HIGH (ref 0.0–40.0)

## 2016-04-02 LAB — HEMOGLOBIN A1C: Hgb A1c MFr Bld: 7.8 % — ABNORMAL HIGH (ref 4.6–6.5)

## 2016-04-02 LAB — LDL CHOLESTEROL, DIRECT: Direct LDL: 98 mg/dL

## 2016-04-02 MED ORDER — OXYBUTYNIN CHLORIDE ER 10 MG PO TB24: 10.0000 mg | ORAL_TABLET | Freq: Every day | ORAL | Status: DC

## 2016-04-02 NOTE — Assessment & Plan Note (Signed)
On the low side Continue Tribenzor Stop Clonidine CBC and CMET today  RTC in 1 month to recheck BP

## 2016-04-02 NOTE — Progress Notes (Signed)
Pre visit review using our clinic review tool, if applicable. No additional management support is needed unless otherwise documented below in the visit note. 

## 2016-04-02 NOTE — Assessment & Plan Note (Signed)
Change Vesicare to Ditropan due to cost

## 2016-04-02 NOTE — Progress Notes (Signed)
Subjective:    Patient ID: Tiffany Arnold, female    DOB: August 17, 1941, 75 y.o.   MRN: [id number]  HPI  Pt presents to the clinic today for 4 month follow up of chronic conditions.  Asthma: Mild, persistent. She takes Combivent daily. Rare Albuterol use.  HTN: BP well controlled on Tribenzor and Clonidine. Her BP today is 112/76. ECG from 03/2014 reviewed.  GERD: She denies breakthrough symptoms on Prilosec and Zantac. She tries to avoid foods that make her reflux worse. She is complaining of some symptoms of "food getting stuck" when she swallows. She has not choked on her food.  GAD: Her anxiety is well controlled on Xanax twice daily. She denies depression, SI/HI.  HLD: Last LDL 100. She denies myalgias on Pravastatin. She does try to consume a low fat diet.  Seasonal allergies: Worse in the spring. She takes Zyrtec daily.  Obesity: Her BMI is 46.98. She has lost 1.5 lbs in the last 6 months.   Urge Incontinence: She takes Vesicare daily. It has helped a little with her incontinence.  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 She does not take any medication for this. She tries to consume a low carb diet. She does not test her sugars. Her last eye exam was last year. Flu- 09/2015. Pneumovax- 01/2012. Prevnar- 03/2015.     Review of Systems      Past Medical History  Diagnosis Date  . Asthma   . Arthritis   . Chicken pox   . Allergy   . Hypertension   . Hyperlipidemia   . Colon polyps   . Phlebitis     Current Outpatient Prescriptions  Medication Sig Dispense Refill  . albuterol (PROAIR HFA) 108 (90 BASE) MCG/ACT inhaler Inhale 2 puffs into the lungs every 6 (six) hours as needed.    Marland Kitchen albuterol-ipratropium (COMBIVENT) 18-103 MCG/ACT inhaler Inhale 2 puffs into the lungs every 4 (four) hours.    . ALPRAZolam (XANAX) 0.25 MG tablet TAKE ONE TABLET BY MOUTH TWICE DAILY AS NEEDED 60 tablet 0  . aspirin EC 81 MG tablet Take 81 mg by mouth daily.    . Calcium Carb-Ergocalciferol 500-200  MG-UNIT TABS Take 1 tablet by mouth 2 (two) times daily.    . cetirizine (ZYRTEC) 10 MG tablet Take 10 mg by mouth daily.    . cloNIDine (CATAPRES) 0.1 MG tablet TAKE ONE TABLET BY MOUTH TWICE DAILY 60 tablet 0  . EPINEPHrine 0.3 mg/0.3 mL IJ SOAJ injection Inject 0.3 mg into the muscle as needed.    . naproxen sodium (ALEVE) 220 MG tablet Take 2 tablets by mouth as needed.    . Olmesartan-Amlodipine-HCTZ 40-5-25 MG TABS TAKE ONE TABLET BY MOUTH ONCE DAILY 90 tablet 1  . omeprazole (PRILOSEC) 40 MG capsule Take 40 mg by mouth daily.    . polyethylene glycol powder (GLYCOLAX/MIRALAX) powder Take 17 g by mouth as needed.    . pravastatin (PRAVACHOL) 40 MG tablet TAKE ONE TABLET BY MOUTH ONCE DAILY 30 tablet 5  . ranitidine (ZANTAC) 150 MG tablet Take 150 mg by mouth daily.    . Tuberculin-Allergy Syringes 27G X 1/2" 1 ML KIT     . VESICARE 10 MG tablet TAKE ONE TABLET BY MOUTH ONCE DAILY 30 tablet 5   No current facility-administered medications for this visit.    Allergies  Allergen Reactions  . Other Swelling    Cats, dogs, mold    Family History  Problem Relation Age of Onset  .  Arthritis Mother   . Stroke Mother   . Hypertension Mother   . Cancer Father     Prostate  . Stroke Father   . Hypertension Father   . Hypertension Maternal Grandmother   . Rheum arthritis Maternal Grandfather   . Stroke Maternal Grandfather   . Hypertension Maternal Grandfather   . Cancer Paternal Grandmother     Colon  . Hypertension Paternal Grandmother   . Hypertension Paternal Grandfather     Social History   Social History  . Marital Status: Married    Spouse Name: N/A  . Number of Children: N/A  . Years of Education: N/A   Occupational History  . Not on file.   Social History Main Topics  . Smoking status: Former Research scientist (life sciences)  . Smokeless tobacco: Not on file     Comment: quit 22 years ago  . Alcohol Use: Yes     Comment: rare  . Drug Use: No  . Sexual Activity: Not Currently    Other Topics Concern  . Not on file   Social History Narrative     Constitutional: Denies fever, malaise, fatigue, headache.  HEENT: Denies eye pain, eye redness, ear pain, ringing in the ears, wax buildup, runny nose, nasal congestion, bloody nose, or sore throat. Respiratory: Pt reports shortness of breath with exertion. Denies difficulty breathing, shortness of breath, cough.   Cardiovascular: Denies chest pain, chest tightness, palpitations or swelling in the hands.  Gastrointestinal: Denies abdominal pain, bloating, constipation, diarrhea or blood in the stool.  GU: Pt reports urinary incontinence. Denies frequency, pain with urination, burning sensation, blood in urine, odor or discharge. Skin: Denies redness, rashes, lesions or ulcercations.  Neurological: Denies dizziness, difficulty with memory, difficulty with speech or problems with balance and coordination.  Psych: Pt reports history of anxiety. Denies depression, SI/HI.  No other specific complaints in a complete review of systems (except as listed in HPI above).  Objective:   Physical Exam  BP 112/66 mmHg  Pulse 71  Temp(Src) 98.5 F (36.9 C) (Oral)  Wt 248 lb 8 oz (112.719 kg)  SpO2 94%   Wt Readings from Last 3 Encounters:  12/04/15 248 lb (112.492 kg)  10/10/15 250 lb (113.399 kg)  04/04/15 239 lb (108.41 kg)    General: Appears her stated age, obese in NAD. Skin: Warm, dry and intact.  Cardiovascular: Normal rate and rhythm. S1,S2 noted.  No murmur, rubs or gallops noted. Trace BLE edema. No carotid bruits noted. Pulmonary/Chest: Normal effort and positive vesicular breath sounds. No respiratory distress. No wheezes, rales or ronchi noted.  Abdomen: Soft and nontender. Normal bowel sounds. Neurological: Alert and oriented. Sensation intact to BLE. Psychiatric: Mood and affect normal. Behavior is normal. Judgment and thought content normal.     BMET    Component Value Date/Time   NA 140 10/10/2015  1447   K 4.2 10/10/2015 1447   CL 101 10/10/2015 1447   CO2 34* 10/10/2015 1447   GLUCOSE 111* 10/10/2015 1447   BUN 24* 10/10/2015 1447   CREATININE 0.73 10/10/2015 1447   CALCIUM 10.2 10/10/2015 1447    Lipid Panel     Component Value Date/Time   CHOL 182 10/10/2015 1447   TRIG 162.0* 10/10/2015 1447   HDL 49.20 10/10/2015 1447   CHOLHDL 4 10/10/2015 1447   VLDL 32.4 10/10/2015 1447   LDLCALC 100* 10/10/2015 1447    CBC    Component Value Date/Time   WBC 7.2 10/10/2015 1447   RBC 4.89  10/10/2015 1447   HGB 14.0 10/10/2015 1447   HCT 43.3 10/10/2015 1447   PLT 301.0 10/10/2015 1447   MCV 88.6 10/10/2015 1447   MCHC 32.3 10/10/2015 1447   RDW 13.6 10/10/2015 1447    Hgb A1C Lab Results  Component Value Date   HGBA1C 7.2* 10/10/2015        Assessment & Plan:

## 2016-04-02 NOTE — Assessment & Plan Note (Signed)
Continue Zyrtec daily 

## 2016-04-02 NOTE — Assessment & Plan Note (Signed)
Encouraged her to consume a low carb, low fat diet A1C today No microalbumin secondary to ACEI/ARB therapy Encouraged yearly eye exam Foot exam today Continue baby ASA Immunizations UTD

## 2016-04-02 NOTE — Assessment & Plan Note (Signed)
Continue Combivent Albuterol prn

## 2016-04-02 NOTE — Assessment & Plan Note (Signed)
Continue Xanax BID Will try weaning this at next visit if she is willing to start SSRI therapy

## 2016-04-02 NOTE — Assessment & Plan Note (Signed)
Encouraged her to work on diet and exercise 

## 2016-04-02 NOTE — Assessment & Plan Note (Signed)
No swallowing difficulty at this time She will continue Prilosec and Zantac

## 2016-04-02 NOTE — Assessment & Plan Note (Signed)
Lipid profile and CMET today Encouraged her to consume a low fat diet Cotninue Pravastatin unless directed otherwise

## 2016-04-02 NOTE — Patient Instructions (Signed)

## 2016-04-04 ENCOUNTER — Ambulatory Visit: Payer: Medicare PPO | Admitting: Internal Medicine

## 2016-04-07 ENCOUNTER — Other Ambulatory Visit: Payer: Self-pay | Admitting: Internal Medicine

## 2016-04-07 DIAGNOSIS — Z1231 Encounter for screening mammogram for malignant neoplasm of breast: Secondary | ICD-10-CM

## 2016-04-09 ENCOUNTER — Encounter: Payer: Medicare PPO | Admitting: Internal Medicine

## 2016-04-11 DIAGNOSIS — J3089 Other allergic rhinitis: Secondary | ICD-10-CM | POA: Diagnosis not present

## 2016-04-11 DIAGNOSIS — L578 Other skin changes due to chronic exposure to nonionizing radiation: Secondary | ICD-10-CM | POA: Diagnosis not present

## 2016-04-11 DIAGNOSIS — J328 Other chronic sinusitis: Secondary | ICD-10-CM | POA: Diagnosis not present

## 2016-04-14 ENCOUNTER — Ambulatory Visit: Payer: Medicare PPO | Admitting: Internal Medicine

## 2016-04-17 ENCOUNTER — Other Ambulatory Visit: Payer: Self-pay | Admitting: Internal Medicine

## 2016-04-17 NOTE — Telephone Encounter (Signed)
Last filled 03/19/16--please advise if okay to call in for 04/19/2016

## 2016-04-17 NOTE — Telephone Encounter (Signed)
Ok to phone in Xanax. Please send Pravastatin with 5 refills electronically

## 2016-04-18 ENCOUNTER — Other Ambulatory Visit: Payer: Self-pay | Admitting: Internal Medicine

## 2016-04-18 NOTE — Telephone Encounter (Signed)
Xanax called into pharmacy and statin escribed

## 2016-04-25 ENCOUNTER — Telehealth: Payer: Self-pay | Admitting: Internal Medicine

## 2016-04-25 NOTE — Telephone Encounter (Signed)
Patient received a letter from [facility].  Please call patient.

## 2016-04-25 NOTE — Telephone Encounter (Signed)
Left message on voicemail.

## 2016-04-29 NOTE — Telephone Encounter (Signed)
Patient said she'd like to discuss going on medication at her appointment with [facility] on 05/02/16.

## 2016-04-30 NOTE — Telephone Encounter (Signed)
Ok, we will discuss at appt

## 2016-05-02 ENCOUNTER — Ambulatory Visit (INDEPENDENT_AMBULATORY_CARE_PROVIDER_SITE_OTHER): Payer: Medicare PPO | Admitting: Internal Medicine

## 2016-05-02 ENCOUNTER — Encounter: Payer: Self-pay | Admitting: Internal Medicine

## 2016-05-02 VITALS — BP 122/76 | HR 74 | Temp 97.7°F | Wt 247.8 lb

## 2016-05-02 DIAGNOSIS — E119 Type 2 diabetes mellitus without complications: Secondary | ICD-10-CM | POA: Diagnosis not present

## 2016-05-02 DIAGNOSIS — I10 Essential (primary) hypertension: Secondary | ICD-10-CM

## 2016-05-02 NOTE — Patient Instructions (Signed)

## 2016-05-02 NOTE — Progress Notes (Signed)
Pre visit review using our clinic review tool, if applicable. No additional management support is needed unless otherwise documented below in the visit note. 

## 2016-05-02 NOTE — Assessment & Plan Note (Signed)
Stable off Clonidine Continue Tribenzor

## 2016-05-02 NOTE — Assessment & Plan Note (Signed)
Encouraged her to cut out ice cream and consume a low carb diet Encouraged exercise to lose weight She has an eye exam scheduled for next week Foot exam today Will hold off on Metformin unless A1C > 8 No microalbumin secondary to ARB therapy

## 2016-05-02 NOTE — Progress Notes (Signed)
Subjective:    Patient ID: Tiffany Arnold, female    DOB: 1941-09-30, 75 y.o.   MRN: [id number]  HPI  Pt presents to the clinic today for 1 month follow up of HTN. She was taken off her Clonidine but advised to continue her Tribenzor. Her BP today is 122/76.  She also wants to discuss her diabetes. Her A1C is up to 7.8%. She does not take any medications for this. She does not adhere to any specific diet and she does not exercise. 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 She denies blurred vision, increased thirst, urinary frequency or numbness or tingling in the hands or feet.  Review of Systems      Past Medical History  Diagnosis Date  . Asthma   . Arthritis   . Chicken pox   . Allergy   . Hypertension   . Hyperlipidemia   . Colon polyps   . Phlebitis     Current Outpatient Prescriptions  Medication Sig Dispense Refill  . albuterol (PROAIR HFA) 108 (90 BASE) MCG/ACT inhaler Inhale 2 puffs into the lungs every 6 (six) hours as needed.    Marland Kitchen albuterol-ipratropium (COMBIVENT) 18-103 MCG/ACT inhaler Inhale 2 puffs into the lungs every 4 (four) hours.    . ALPRAZolam (XANAX) 0.25 MG tablet TAKE ONE TABLET BY MOUTH TWICE DAILY AS NEEDED 60 tablet 0  . aspirin EC 81 MG tablet Take 81 mg by mouth daily.    . cetirizine (ZYRTEC) 10 MG tablet Take 10 mg by mouth daily.    . Cholecalciferol (D3-1000 PO) Take 1 capsule by mouth every morning.    Marland Kitchen EPINEPHrine 0.3 mg/0.3 mL IJ SOAJ injection Inject 0.3 mg into the muscle as needed.    Marland Kitchen glucosamine-chondroitin 500-400 MG tablet Take 1 tablet by mouth 2 (two) times daily.    . naproxen sodium (ALEVE) 220 MG tablet Take 2 tablets by mouth as needed.    . Olmesartan-Amlodipine-HCTZ 40-5-25 MG TABS TAKE ONE TABLET BY MOUTH ONCE DAILY 90 tablet 1  . omeprazole (PRILOSEC) 40 MG capsule Take 40 mg by mouth daily.    Marland Kitchen oxybutynin (DITROPAN-XL) 10 MG 24 hr tablet Take 1 tablet (10 mg total) by mouth at bedtime. 30 tablet 5  . pravastatin (PRAVACHOL)  40 MG tablet TAKE ONE TABLET BY MOUTH ONCE DAILY 30 tablet 5  . ranitidine (ZANTAC) 150 MG tablet Take 150 mg by mouth daily.    . Tuberculin-Allergy Syringes 27G X 1/2" 1 ML KIT     . vitamin B-12 (CYANOCOBALAMIN) 1000 MCG tablet Take 1,000 mcg by mouth daily.     No current facility-administered medications for this visit.    Allergies  Allergen Reactions  . Other Swelling    Cats, dogs, mold    Family History  Problem Relation Age of Onset  . Arthritis Mother   . Stroke Mother   . Hypertension Mother   . Cancer Father     Prostate  . Stroke Father   . Hypertension Father   . Hypertension Maternal Grandmother   . Rheum arthritis Maternal Grandfather   . Stroke Maternal Grandfather   . Hypertension Maternal Grandfather   . Cancer Paternal Grandmother     Colon  . Hypertension Paternal Grandmother   . Hypertension Paternal Grandfather     Social History   Social History  . Marital Status: Married    Spouse Name: N/A  . Number of Children: N/A  . Years of Education: N/A   Occupational  History  . Not on file.   Social History Main Topics  . Smoking status: Former Research scientist (life sciences)  . Smokeless tobacco: Not on file     Comment: quit 22 years ago  . Alcohol Use: Yes     Comment: rare  . Drug Use: No  . Sexual Activity: Not Currently   Other Topics Concern  . Not on file   Social History Narrative     Constitutional: Denies fever, malaise, fatigue, headache or abrupt weight changes.  Respiratory: Denies difficulty breathing, shortness of breath, cough or sputum production.   Cardiovascular: Denies chest pain, chest tightness, palpitations or swelling in the hands or feet.  Skin: Denies redness, rashes, lesions or ulcercations.  Neurological: Denies dizziness, difficulty with memory, difficulty with speech or problems with balance and coordination.    No other specific complaints in a complete review of systems (except as listed in HPI above).  Objective:    Physical Exam    BP 122/76 mmHg  Pulse 74  Temp(Src) 97.7 F (36.5 C) (Oral)  Wt 247 lb 12 oz (112.379 kg) Wt Readings from Last 3 Encounters:  05/02/16 247 lb 12 oz (112.379 kg)  04/02/16 248 lb 8 oz (112.719 kg)  12/04/15 248 lb (112.492 kg)    General: Appears her stated age, obese in NAD. Skin: Warm, dry and intact.  Cardiovascular: Normal rate and rhythm. S1,S2 noted.  No murmur, rubs or gallops noted.  Pulmonary/Chest: Normal effort and positive vesicular breath sounds. No respiratory distress. No wheezes, rales or ronchi noted.  Neurological: Alert and oriented.   BMET    Component Value Date/Time   NA 138 04/02/2016 1441   K 3.8 04/02/2016 1441   CL 100 04/02/2016 1441   CO2 35* 04/02/2016 1441   GLUCOSE 170* 04/02/2016 1441   BUN 22 04/02/2016 1441   CREATININE 0.80 04/02/2016 1441   CALCIUM 10.1 04/02/2016 1441    Lipid Panel     Component Value Date/Time   CHOL 186 04/02/2016 1441   TRIG 234.0* 04/02/2016 1441   HDL 46.90 04/02/2016 1441   CHOLHDL 4 04/02/2016 1441   VLDL 46.8* 04/02/2016 1441   LDLCALC 100* 10/10/2015 1447    CBC    Component Value Date/Time   WBC 8.3 04/02/2016 1441   RBC 4.81 04/02/2016 1441   HGB 13.7 04/02/2016 1441   HCT 41.6 04/02/2016 1441   PLT 310.0 04/02/2016 1441   MCV 86.4 04/02/2016 1441   MCHC 32.9 04/02/2016 1441   RDW 13.9 04/02/2016 1441    Hgb A1C Lab Results  Component Value Date   HGBA1C 7.8* 04/02/2016       Assessment & Plan:

## 2016-05-07 ENCOUNTER — Other Ambulatory Visit: Payer: Self-pay | Admitting: Internal Medicine

## 2016-05-07 ENCOUNTER — Ambulatory Visit
Admission: RE | Admit: 2016-05-07 | Discharge: 2016-05-07 | Disposition: A | Discharge status: Home / Self Care | Payer: Medicare PPO | Source: Ambulatory Visit | Attending: Internal Medicine | Admitting: Internal Medicine

## 2016-05-07 DIAGNOSIS — Z1231 Encounter for screening mammogram for malignant neoplasm of breast: Secondary | ICD-10-CM | POA: Diagnosis not present

## 2016-05-07 DIAGNOSIS — R928 Other abnormal and inconclusive findings on diagnostic imaging of breast: Secondary | ICD-10-CM | POA: Diagnosis not present

## 2016-05-08 DIAGNOSIS — H2513 Age-related nuclear cataract, bilateral: Secondary | ICD-10-CM | POA: Diagnosis not present

## 2016-05-08 DIAGNOSIS — H25013 Cortical age-related cataract, bilateral: Secondary | ICD-10-CM | POA: Diagnosis not present

## 2016-05-09 ENCOUNTER — Other Ambulatory Visit: Payer: Self-pay | Admitting: Internal Medicine

## 2016-05-09 DIAGNOSIS — R928 Other abnormal and inconclusive findings on diagnostic imaging of breast: Secondary | ICD-10-CM

## 2016-05-13 ENCOUNTER — Other Ambulatory Visit: Payer: Self-pay | Admitting: Internal Medicine

## 2016-05-16 ENCOUNTER — Other Ambulatory Visit: Payer: Self-pay | Admitting: Internal Medicine

## 2016-05-16 ENCOUNTER — Telehealth: Payer: Self-pay | Admitting: Internal Medicine

## 2016-05-16 ENCOUNTER — Telehealth: Payer: Self-pay

## 2016-05-16 MED ORDER — ALPRAZOLAM 0.25 MG PO TABS: 0.2500 mg | ORAL_TABLET | Freq: Two times a day (BID) | ORAL | Status: DC | PRN

## 2016-05-16 NOTE — Telephone Encounter (Signed)
Last filled 04/18/16--per verbal order okay to call in to be filled on or after 05/18/16

## 2016-05-16 NOTE — Telephone Encounter (Signed)
Patient's spouse dropped off a Research officer, [employer] Ophthalmology Medical History form to be filled out. Form was put in the CMA's incoming box.

## 2016-05-17 ENCOUNTER — Other Ambulatory Visit: Payer: Self-pay | Admitting: Internal Medicine

## 2016-05-20 NOTE — Telephone Encounter (Signed)
Spouse aware paperwork done Spouse also aware we only received 1 page of 2 i called [facility] and they stated is was medical history they were to fax paperwork never received it

## 2016-05-20 NOTE — Telephone Encounter (Signed)
Form completed.

## 2016-05-21 DIAGNOSIS — Z7689 Persons encountering health services in other specified circumstances: Secondary | ICD-10-CM

## 2016-05-23 ENCOUNTER — Ambulatory Visit
Admission: RE | Admit: 2016-05-23 | Discharge: 2016-05-23 | Disposition: A | Discharge status: Home / Self Care | Payer: Medicare PPO | Source: Ambulatory Visit | Attending: Internal Medicine | Admitting: Internal Medicine

## 2016-05-23 DIAGNOSIS — N6489 Other specified disorders of breast: Secondary | ICD-10-CM | POA: Diagnosis not present

## 2016-05-23 DIAGNOSIS — R928 Other abnormal and inconclusive findings on diagnostic imaging of breast: Secondary | ICD-10-CM

## 2016-05-26 ENCOUNTER — Other Ambulatory Visit: Payer: Self-pay | Admitting: Internal Medicine

## 2016-05-26 DIAGNOSIS — N6489 Other specified disorders of breast: Secondary | ICD-10-CM

## 2016-06-02 ENCOUNTER — Telehealth: Payer: Self-pay | Admitting: *Deleted

## 2016-06-02 NOTE — Telephone Encounter (Signed)
Mel,   Can you call to see if they need anything from [place] in order to get her a new CPAP machine.
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
## 2016-06-03 ENCOUNTER — Ambulatory Visit
Admission: RE | Admit: 2016-06-03 | Discharge: 2016-06-03 | Disposition: A | Discharge status: Home / Self Care | Payer: Medicare PPO | Source: Ambulatory Visit | Attending: Internal Medicine | Admitting: Internal Medicine

## 2016-06-03 ENCOUNTER — Other Ambulatory Visit: Payer: Self-pay | Admitting: Internal Medicine

## 2016-06-03 DIAGNOSIS — N6489 Other specified disorders of breast: Secondary | ICD-10-CM

## 2016-06-03 DIAGNOSIS — N6011 Diffuse cystic mastopathy of right breast: Secondary | ICD-10-CM | POA: Diagnosis not present

## 2016-06-03 NOTE — Telephone Encounter (Signed)
Patient's husband,Russell,called back and said they still haven't heard anything from [place].  Patient is having a hard time sleeping. Please call Joneen Caraway back at [phone].

## 2016-06-03 NOTE — Telephone Encounter (Signed)
Patient's husband called and asked to be called back on his cell phone [phone].

## 2016-06-04 DIAGNOSIS — H25011 Cortical age-related cataract, right eye: Secondary | ICD-10-CM | POA: Diagnosis not present

## 2016-06-04 DIAGNOSIS — H2511 Age-related nuclear cataract, right eye: Secondary | ICD-10-CM | POA: Diagnosis not present

## 2016-06-04 DIAGNOSIS — H353121 Nonexudative age-related macular degeneration, left eye, early dry stage: Secondary | ICD-10-CM | POA: Diagnosis not present

## 2016-06-04 DIAGNOSIS — H353111 Nonexudative age-related macular degeneration, right eye, early dry stage: Secondary | ICD-10-CM | POA: Diagnosis not present

## 2016-06-04 DIAGNOSIS — H25041 Posterior subcapsular polar age-related cataract, right eye: Secondary | ICD-10-CM | POA: Diagnosis not present

## 2016-06-04 DIAGNOSIS — H2513 Age-related nuclear cataract, bilateral: Secondary | ICD-10-CM | POA: Diagnosis not present

## 2016-06-04 DIAGNOSIS — H25012 Cortical age-related cataract, left eye: Secondary | ICD-10-CM | POA: Diagnosis not present

## 2016-06-04 DIAGNOSIS — H2512 Age-related nuclear cataract, left eye: Secondary | ICD-10-CM | POA: Diagnosis not present

## 2016-06-04 DIAGNOSIS — H25042 Posterior subcapsular polar age-related cataract, left eye: Secondary | ICD-10-CM | POA: Diagnosis not present

## 2016-06-04 NOTE — Telephone Encounter (Signed)
Does she have a pulmonologist or does she need a referral?

## 2016-06-04 NOTE — Telephone Encounter (Signed)
Left detailed msg on VM per HIPAA on husband's VM

## 2016-06-04 NOTE — Telephone Encounter (Signed)
Spoke with [employer] at [facility]. She states that because it has been more than 5 years, pt will need OV note in reference to the order, will need new Rx and pressure readings . She will fax form with what is required to insure insurance coverage. She also reports there are 2 notes on file where they told pt's husband Joneen Caraway that a new order would be needed from provider on 05/31/2016 and on 06/02/16 a RT called them and lmovm notifying them that the OV notes, new order and pressure readings would be required. No one returned her call...Marland KitchenMarland KitchenPlease advise

## 2016-06-05 ENCOUNTER — Other Ambulatory Visit: Payer: Self-pay | Admitting: Internal Medicine

## 2016-06-05 DIAGNOSIS — J309 Allergic rhinitis, unspecified: Secondary | ICD-10-CM | POA: Diagnosis not present

## 2016-06-06 NOTE — Telephone Encounter (Signed)
Left detailed msg on VM per HIPAA  

## 2016-06-09 ENCOUNTER — Other Ambulatory Visit: Payer: Self-pay | Admitting: Internal Medicine

## 2016-06-09 ENCOUNTER — Ambulatory Visit
Admission: RE | Admit: 2016-06-09 | Discharge: 2016-06-09 | Disposition: A | Discharge status: Home / Self Care | Payer: Medicare PPO | Source: Ambulatory Visit | Attending: Internal Medicine | Admitting: Internal Medicine

## 2016-06-09 DIAGNOSIS — N6489 Other specified disorders of breast: Secondary | ICD-10-CM | POA: Diagnosis not present

## 2016-06-09 DIAGNOSIS — C50311 Malignant neoplasm of lower-inner quadrant of right female breast: Secondary | ICD-10-CM | POA: Diagnosis not present

## 2016-06-12 ENCOUNTER — Other Ambulatory Visit: Payer: Self-pay | Admitting: General Surgery

## 2016-06-12 DIAGNOSIS — C50911 Malignant neoplasm of unspecified site of right female breast: Secondary | ICD-10-CM

## 2016-06-16 ENCOUNTER — Other Ambulatory Visit: Payer: Self-pay | Admitting: Internal Medicine

## 2016-06-16 ENCOUNTER — Telehealth: Payer: Self-pay | Admitting: *Deleted

## 2016-06-16 ENCOUNTER — Ambulatory Visit
Admission: RE | Admit: 2016-06-16 | Discharge: 2016-06-16 | Disposition: A | Discharge status: Home / Self Care | Payer: Medicare PPO | Source: Ambulatory Visit | Attending: General Surgery | Admitting: General Surgery

## 2016-06-16 ENCOUNTER — Other Ambulatory Visit: Payer: Self-pay | Admitting: General Surgery

## 2016-06-16 DIAGNOSIS — C50911 Malignant neoplasm of unspecified site of right female breast: Secondary | ICD-10-CM

## 2016-06-16 NOTE — Telephone Encounter (Signed)
Received refill request electronically Last refill 05/16/16 #60 Last office visit 05/02/16

## 2016-06-16 NOTE — Telephone Encounter (Signed)
Order for CPAP in your IN box.

## 2016-06-16 NOTE — Telephone Encounter (Signed)
Please refer to phone note from 06/02/2016---I have already spoken to [place] and I Left message on voicemail  for pt to let me know if she currently had a Pulm or needed referral---pt never returned call---but last sleep study has been more than 5 years and insurance is requesting new sleep study and 02 stats that only pulm can give---called pt again and Left detailed msg on VM per HIPAA

## 2016-06-16 NOTE — Telephone Encounter (Signed)
Done in myd bvox

## 2016-06-16 NOTE — Telephone Encounter (Signed)
Rx called to pharmacy as instructed. 

## 2016-06-16 NOTE — Telephone Encounter (Signed)
Ok to phone in Xanax 

## 2016-06-17 ENCOUNTER — Other Ambulatory Visit: Payer: Self-pay | Admitting: General Surgery

## 2016-06-17 ENCOUNTER — Ambulatory Visit
Admission: RE | Admit: 2016-06-17 | Discharge: 2016-06-17 | Disposition: A | Discharge status: Home / Self Care | Payer: Medicare PPO | Source: Ambulatory Visit | Attending: General Surgery | Admitting: General Surgery

## 2016-06-17 DIAGNOSIS — C50911 Malignant neoplasm of unspecified site of right female breast: Secondary | ICD-10-CM

## 2016-06-17 DIAGNOSIS — F419 Anxiety disorder, unspecified: Secondary | ICD-10-CM | POA: Diagnosis not present

## 2016-06-17 DIAGNOSIS — C773 Secondary and unspecified malignant neoplasm of axilla and upper limb lymph nodes: Secondary | ICD-10-CM | POA: Diagnosis not present

## 2016-06-17 DIAGNOSIS — C50311 Malignant neoplasm of lower-inner quadrant of right female breast: Secondary | ICD-10-CM | POA: Diagnosis not present

## 2016-06-17 DIAGNOSIS — I10 Essential (primary) hypertension: Secondary | ICD-10-CM | POA: Diagnosis not present

## 2016-06-17 DIAGNOSIS — R590 Localized enlarged lymph nodes: Secondary | ICD-10-CM | POA: Diagnosis not present

## 2016-06-17 DIAGNOSIS — E119 Type 2 diabetes mellitus without complications: Secondary | ICD-10-CM | POA: Diagnosis not present

## 2016-06-17 DIAGNOSIS — F329 Major depressive disorder, single episode, unspecified: Secondary | ICD-10-CM | POA: Diagnosis not present

## 2016-06-17 DIAGNOSIS — Z96653 Presence of artificial knee joint, bilateral: Secondary | ICD-10-CM | POA: Diagnosis not present

## 2016-06-17 DIAGNOSIS — Z9889 Other specified postprocedural states: Secondary | ICD-10-CM | POA: Diagnosis not present

## 2016-06-17 DIAGNOSIS — Z6841 Body Mass Index (BMI) 40.0 and over, adult: Secondary | ICD-10-CM | POA: Diagnosis not present

## 2016-06-17 DIAGNOSIS — J309 Allergic rhinitis, unspecified: Secondary | ICD-10-CM | POA: Diagnosis not present

## 2016-06-18 ENCOUNTER — Other Ambulatory Visit: Payer: Self-pay | Admitting: General Surgery

## 2016-06-18 ENCOUNTER — Telehealth: Payer: Self-pay | Admitting: Hematology

## 2016-06-18 ENCOUNTER — Encounter: Payer: Self-pay | Admitting: Hematology

## 2016-06-18 DIAGNOSIS — C50911 Malignant neoplasm of unspecified site of right female breast: Secondary | ICD-10-CM

## 2016-06-18 NOTE — Telephone Encounter (Signed)
Appointment with Dr. Burr Medico on 7/7 at 11:15am. Patient aware to arrive 30 minutes early. Location given. Letter to referring.

## 2016-06-19 ENCOUNTER — Ambulatory Visit
Admission: RE | Admit: 2016-06-19 | Discharge: 2016-06-19 | Disposition: A | Discharge status: Home / Self Care | Payer: Medicare PPO | Source: Ambulatory Visit | Attending: General Surgery | Admitting: General Surgery

## 2016-06-19 DIAGNOSIS — D0511 Intraductal carcinoma in situ of right breast: Secondary | ICD-10-CM | POA: Diagnosis not present

## 2016-06-19 DIAGNOSIS — C50911 Malignant neoplasm of unspecified site of right female breast: Secondary | ICD-10-CM

## 2016-06-19 MED ORDER — GADOBENATE DIMEGLUMINE 529 MG/ML IV SOLN
20.0000 mL | Freq: Once | INTRAVENOUS | Status: AC | PRN
  Administered 2016-06-19: 20 mL via INTRAVENOUS

## 2016-06-20 ENCOUNTER — Telehealth: Payer: Self-pay | Admitting: *Deleted

## 2016-06-20 ENCOUNTER — Other Ambulatory Visit: Payer: Self-pay | Admitting: Internal Medicine

## 2016-06-20 NOTE — Telephone Encounter (Signed)
Received appt date/time from Andrea.  Mailed new pt packet to pt.  

## 2016-06-25 ENCOUNTER — Telehealth: Payer: Self-pay | Admitting: Internal Medicine

## 2016-06-25 DIAGNOSIS — Z9989 Dependence on other enabling machines and devices: Principal | ICD-10-CM

## 2016-06-25 DIAGNOSIS — G4733 Obstructive sleep apnea (adult) (pediatric): Secondary | ICD-10-CM

## 2016-06-25 NOTE — Telephone Encounter (Addendum)
Patient's husband said the patient needs a referral to Dr. Kara Mead, Pulmonology, because she needs a new CPAP machine.  Her's is broken right now and he said they use it every night.

## 2016-06-26 DIAGNOSIS — Z9989 Dependence on other enabling machines and devices: Principal | ICD-10-CM

## 2016-06-26 DIAGNOSIS — G4733 Obstructive sleep apnea (adult) (pediatric): Secondary | ICD-10-CM | POA: Insufficient documentation

## 2016-06-26 NOTE — Telephone Encounter (Signed)
Referral placed.

## 2016-06-27 ENCOUNTER — Encounter: Payer: Self-pay | Admitting: *Deleted

## 2016-06-27 ENCOUNTER — Ambulatory Visit (HOSPITAL_BASED_OUTPATIENT_CLINIC_OR_DEPARTMENT_OTHER): Payer: Medicare PPO | Admitting: Hematology

## 2016-06-27 VITALS — BP 147/65 | HR 74 | Temp 98.0°F | Resp 19 | Ht 61.0 in | Wt 244.1 lb

## 2016-06-27 DIAGNOSIS — C50311 Malignant neoplasm of lower-inner quadrant of right female breast: Secondary | ICD-10-CM | POA: Diagnosis not present

## 2016-06-27 DIAGNOSIS — C773 Secondary and unspecified malignant neoplasm of axilla and upper limb lymph nodes: Secondary | ICD-10-CM | POA: Diagnosis not present

## 2016-06-27 DIAGNOSIS — Z170 Estrogen receptor positive status [ER+]: Secondary | ICD-10-CM

## 2016-06-27 NOTE — Progress Notes (Signed)
[place]  [phone] Fax:([phone]) [place] Note   Patient Care Team: [name], NP as PCP - General (Internal Medicine) 06/27/2016  Referring physician: Dr. Dalbert Batman  CHIEF COMPLAINTS/PURPOSE OF CONSULTATION:  Newly diagnosed right breast cancer  . Oncology History   Breast cancer of lower-inner quadrant of right female breast [facility])   Staging form: Breast, AJCC 7th Edition     Clinical stage from 06/09/2016: Stage IIB (T2, N1, M0) - Signed by Truitt Merle, MD on 06/27/2016       Breast cancer of lower-inner quadrant of right female breast ([place])   05/23/2016 Mammogram Diagnostic mammogram and ultrasound showed suspicious architectural distortion within the right breast lower inner quadrant, measuring 1.3 cm, without sonographic corelate.   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 There are abnormal right axillary lymph nodes showing second cortical's, no evidence of malignancy in the left breast.    HISTORY OF PRESENTING ILLNESS:  Tiffany Arnold 75 y.o. female is here because of her recently diagnosed left breast cancer. She presents to my clinic with her friend, who  is a Marine scientist.   Her cancer was discovered by screening mammogram. She had a right breast cyst in 2001, which was removed. She has been doing mammogram once a year. The mammogram and ultrasound on 05/23/2016 showed a suspicious architectural this portion, 1.3 cm, without sonographic correlation. She underwent core needle biopsy of the right breast mass twice and right axilla node biopsy, one breast biopsy and node biopsy showed invasive ductal carcinoma and DCIS, ER/PR strong positive, HER-2 negative.  She denies any other new symptoms. She has noticed mild fatigued lately, she still works full time in a vet's office, she has IBS, has intermittent constipation and diarrhea. She has arthritis, and both knee replacement and shoulder surgery before, she also has some back pain lately, she takes tylenol occasionally.  She lives with her husband, moderately active. No family history of breast cancer  GYN HISTORY  Menarchal: 13 LMP: 87 Contraceptive: 4-5 years HRT: 3 years  G2P2: no breast feeding, daughter 76 yo and son is 31 yo.     MEDICAL HISTORY:  Past Medical History  Diagnosis Date  . Asthma   . Arthritis   . Chicken pox   . Allergy   . Hypertension   . Hyperlipidemia   . Colon polyps   . Phlebitis     SURGICAL HISTORY: Past Surgical History  Procedure Laterality Date  . Breast surgery Left 2000    Biopsy  . Hernia repair  2229    Umbillical  . Replacement total knee Bilateral 2007  . Nasal sinus surgery  2015  . Total shoulder replacement Left 2007  . Bunionectomy Bilateral 1998    great toe fusion on right foot  .  Breast cyst excision Right 2001    negative    SOCIAL HISTORY: Social History   Social History  . Marital Status: Married    Spouse Name: N/A  . Number of Children: N/A  . Years of Education: N/A   Occupational History  . Not on file.   Social History Main Topics  . Smoking status: Former Research scientist (life sciences)  . Smokeless tobacco: Not on file     Comment: quit  22 years ago  . Alcohol Use: Yes     Comment: rare  . Drug Use: No  . Sexual Activity: Not Currently   Other Topics Concern  . Not on file   Social History Narrative    FAMILY HISTORY: Family History  Problem Relation Age of Onset  . Arthritis Mother   . Stroke Mother   . Hypertension Mother   . Cancer Father     Prostate  . Stroke Father   . Hypertension Father   . Hypertension Maternal Grandmother   . Rheum arthritis Maternal Grandfather   . Stroke Maternal Grandfather   . Hypertension Maternal Grandfather   . Cancer Paternal Grandmother     Colon  . Hypertension Paternal Grandmother   . Hypertension Paternal Grandfather   . Breast cancer Neg Hx     ALLERGIES:  is allergic to other.  MEDICATIONS:  Current Outpatient Prescriptions  Medication Sig Dispense Refill  . albuterol (PROAIR HFA) 108 (90 BASE) MCG/ACT inhaler Inhale 2 puffs into the lungs every 6 (six) hours as needed.    Marland Kitchen albuterol-ipratropium (COMBIVENT) 18-103 MCG/ACT inhaler Inhale 2 puffs into the lungs every 4 (four) hours.    . ALPRAZolam (XANAX) 0.25 MG tablet TAKE ONE TABLET BY MOUTH TWICE DAILY AS NEEDED 60 tablet 0  . aspirin EC 81 MG tablet Take 81 mg by mouth daily.    . cetirizine (ZYRTEC) 10 MG tablet Take 10 mg by mouth daily.    . Cholecalciferol (D3-1000 PO) Take 1 capsule by mouth every morning.    Marland Kitchen EPINEPHrine 0.3 mg/0.3 mL IJ SOAJ injection Inject 0.3 mg into the muscle as needed.    Marland Kitchen glucosamine-chondroitin 500-400 MG tablet Take 1 tablet by mouth 2 (two) times daily.    . naproxen sodium (ALEVE) 220 MG tablet Take 2 tablets by mouth as needed.    . Olmesartan-Amlodipine-HCTZ 40-5-25 MG TABS TAKE ONE TABLET BY MOUTH ONCE DAILY 90 tablet 1  . omeprazole (PRILOSEC) 40 MG capsule Take 40 mg by mouth daily.    Marland Kitchen oxybutynin (DITROPAN-XL) 10 MG 24 hr tablet Take 1 tablet (10 mg total) by mouth at bedtime. 30 tablet 5  . pravastatin (PRAVACHOL) 40 MG tablet TAKE ONE TABLET BY MOUTH ONCE  DAILY 30 tablet 5  . ranitidine (ZANTAC) 150 MG tablet Take 150 mg by mouth daily.    . Tuberculin-Allergy Syringes 27G X 1/2" 1 ML KIT     . vitamin B-12 (CYANOCOBALAMIN) 1000 MCG tablet Take 1,000 mcg by mouth daily.     No current facility-administered medications for this visit.    REVIEW OF SYSTEMS:   Constitutional: Denies fevers, chills or abnormal night sweats Eyes: Denies blurriness of vision, double vision or watery eyes Ears, nose, mouth, throat, and face: Denies mucositis or sore throat Respiratory: Denies cough, dyspnea or wheezes Cardiovascular: Denies palpitation, chest discomfort or lower extremity swelling Gastrointestinal:  Denies nausea, heartburn or change in bowel habits Skin: Denies abnormal skin rashes Lymphatics: Denies new lymphadenopathy or easy bruising Neurological:Denies numbness, tingling or  new weaknesses Behavioral/Psych: Mood is stable, no new changes  All other systems were reviewed with the patient and are negative.  PHYSICAL EXAMINATION: ECOG PERFORMANCE STATUS: 0 - Asymptomatic  Filed Vitals:   06/27/16 1124  BP: 147/65  Pulse: 74  Temp: 98 F (36.7 C)  Resp: 19   Filed Weights   06/27/16 1124  Weight: 244 lb 1.6 oz (110.723 kg)    GENERAL:alert, no distress and comfortable SKIN: skin color, texture, turgor are normal, no rashes or significant lesions EYES: normal, conjunctiva are pink and non-injected, sclera clear OROPHARYNX:no exudate, no erythema and lips, buccal mucosa, and tongue normal  NECK: supple, thyroid normal size, non-tender, without nodularity LYMPH:  no palpable lymphadenopathy in the cervical, axillary or inguinal LUNGS: clear to auscultation and percussion with normal breathing effort HEART: regular rate & rhythm and no murmurs and no lower extremity edema ABDOMEN:abdomen soft, non-tender and normal bowel sounds Musculoskeletal:no cyanosis of digits and no clubbing  PSYCH: alert & oriented x 3 with fluent  speech NEURO: no focal motor/sensory deficits Breasts: Breast inspection showed them to be symmetrical with no nipple discharge. Palpation of the breasts and axilla revealed no obvious mass that I could appreciate. No bruise at the biopsy site.   LABORATORY DATA:  I have reviewed the data as listed CBC Latest Ref Rng 04/02/2016 10/10/2015 04/04/2015  WBC 4.0 - 10.5 K/uL 8.3 7.2 8.4  Hemoglobin 12.0 - 15.0 g/dL 13.7 14.0 14.1  Hematocrit 36.0 - 46.0 % 41.6 43.3 41.6  Platelets 150.0 - 400.0 K/uL 310.0 301.0 310.0   CMP Latest Ref Rng 04/02/2016 10/10/2015 04/04/2015  Glucose 70 - 99 mg/dL 170(H) 111(H) 89  BUN 6 - 23 mg/dL 22 24(H) 20  Creatinine 0.40 - 1.20 mg/dL 0.80 0.73 0.64  Sodium 135 - 145 mEq/L 138 140 138  Potassium 3.5 - 5.1 mEq/L 3.8 4.2 4.3  Chloride 96 - 112 mEq/L 100 101 101  CO2 19 - 32 mEq/L 35(H) 34(H) 32  Calcium 8.4 - 10.5 mg/dL 10.1 10.2 10.4  Total Protein 6.0 - 8.3 g/dL 7.1 7.1 7.3  Total Bilirubin 0.2 - 1.2 mg/dL 0.5 0.5 0.4  Alkaline Phos 39 - 117 U/L 68 58 64  AST 0 - 37 U/L _0 ALT 0 - 35 U/L _1 PATHOLOGY REPORT  Diagnosis 06/03/2016 Breast, right, needle core biopsy, ILQ focal 1.3 cm asymmetry/distortion - ATYPICAL DUCTAL HYPERPLASIA WITH CALCIFICATIONS. - FIBROCYSTIC CHANGES WITH CALCIFICATIONS. - SEE COMMENT. Microscopic Comment The results were called to [facility] on 06/04/16. (JBK:ds 06/04/16)   Diagnosis 06/09/2016 Breast, right, needle core biopsy, inner - INVASIVE DUCTAL CARCINOMA. - DUCTAL CARCINOMA IN SITU. - SEE COMMENT. Microscopic Comment The carcinoma appears grade 1-2. A breast prognostic profile will be performed and the results reported separately. The results were called to [facility] on 06/10/2016. (JBK:ecj 06/10/2016) Results: HER2 - NEGATIVE RATIO OF HER2/CEP17 SIGNALS 1.55 AVERAGE HER2 COPY NUMBER PER CELL 2.25  Results: IMMUNOHISTOCHEMICAL AND MORPHOMETRIC ANALYSIS  PERFORMED MANUALLY Estrogen Receptor: 100%, POSITIVE, STRONG STAINING INTENSITY Progesterone Receptor: 100%, POSITIVE, STRONG STAINING INTENSITY Proliferation Marker Ki67: 40%   Diagnosis 06/17/2016 Lymph node, needle/core biopsy, right, inferior, axilla to far lateral breast - METASTATIC CARCINOMA, SEE COMMENT. Microscopic Comment The morphology is consistent with the patient breast carcinoma. Prognostic markers will be ordered and reported in an addendum. The case was called to [facility] on 06/18/2016. Results: IMMUNOHISTOCHEMICAL AND MORPHOMETRIC  ANALYSIS PERFORMED MANUALLY Estrogen Receptor: 100%, POSITIVE, STRONG STAINING INTENSITY Progesterone Receptor: 95%, POSITIVE, STRONG STAINING INTENSITY  Results: HER2 - NEGATIVE RATIO OF HER2/CEP17 SIGNALS 1.25 AVERAGE HER2 COPY NUMBER PER CELL 2.00    RADIOGRAPHIC STUDIES: I have personally reviewed the radiological images as listed and agreed with the findings in the report. Mr Breast Bilateral W Wo Contrast  06/19/2016  CLINICAL DATA:  Patient underwent biopsy of architectural distortion in the medial right breast, which initially revealed atypical ductal hyperplasia. Patient underwent a subsequent biopsy adjacent to this which revealed invasive ductal carcinoma and DCIS. She also subsequently underwent biopsy of an abnormal right axillary lymph node reviewing metastatic carcinoma. Current exam is to assess extent of disease. LABS:  Creatinine was obtained on site at [facility] at [address] Results: Creatinine 0.8 mg/dL.  GFR 71. EXAM: BILATERAL BREAST MRI WITH AND WITHOUT CONTRAST TECHNIQUE: Multiplanar, multisequence MR images of both breasts were obtained prior to and following the intravenous administration of 20 ml of MultiHance. THREE-DIMENSIONAL MR IMAGE RENDERING ON INDEPENDENT WORKSTATION: Three-dimensional MR images were rendered by post-processing of the original MR data on an independent  workstation. The three-dimensional MR images were interpreted, and findings are reported in the following complete MRI report for this study. Three dimensional images were evaluated at the independent DynaCad workstation COMPARISON:  Previous exam(s). FINDINGS: Breast composition: b. Scattered fibroglandular tissue. Background parenchymal enhancement: Minimal Right breast: Enhancement surrounds the 2 biopsy sites, the larger and more medial measuring 2.7 x 1.3 x 1.6 cm, and the smaller, more inferior measuring 16 x 13 x 9 mm. There is also a small focus of rim enhancement just deep to the larger lesion, measuring 9 x 7 x 6 mm. The measurement for the entire area containing the 3 rim enhancing lesions is 4.5 cm from medial-lateral, 2.3 cm from superior to inferior and 2.1 cm from anterior to posterior. Left breast: No mass or abnormal enhancement. Lymph nodes: There is an enlarged right axillary lymph node with a thickened cortex measuring 7 mm. There is associated biopsy clip artifact. A lymph node posterior to there is demonstrates a cortex measuring 6 mm in thickness several other nodes show borderline to mildly thickened cortex is in the right axilla. No abnormal left axillary lymph nodes. Ancillary findings:  None. IMPRESSION: 1. There is rim enhancement in 3 locations, which are relatively closely approximated in the lower inner right breast, spanning an area of 4.5 x 2.1 x 2.3 cm. Two of these are associated with susceptibility artifact reflecting biopsy clips. 2. There are no other areas of abnormal enhancement within the right breast. 3. There are abnormal right axillary lymph nodes showing thickened cortices, the largest of which reflects the biopsied, metastatic, lymph node. 4. No evidence of malignancy in the left breast. RECOMMENDATION: 1. Treatment as planned. The enhancing lesions described above could be bracketed at time of surgery to allow the entire area to be removed during the lumpectomy. BI-RADS  CATEGORY  6: Known biopsy-proven malignancy. Electronically Signed   By: Lajean Manes M.D.   On: 06/19/2016 11:54   US Breast Ltd Uni Right Inc Axilla  06/16/2016  CLINICAL DATA:  Recent diagnosis of right breast cancer. EXAM: ULTRASOUND OF THE RIGHT BREAST COMPARISON:  Previous exam(s). FINDINGS: On physical exam, no suspicious masses of found. Targeted ultrasound is performed, showing a single lymph node in the low right axilla, which demonstrates asymmetric cortical thickening of up to 5 mm. IMPRESSION: Single abnormal low right axillary  lymph node, for which ultrasound-guided core needle biopsy is recommended. RECOMMENDATION: Ultrasound-guided core needle biopsy of right axilla. I have discussed the findings and recommendations with the patient. Results were also provided in writing at the conclusion of the visit. If applicable, a reminder letter will be sent to the patient regarding the next appointment. BI-RADS CATEGORY  4: Suspicious abnormality - biopsy should be considered. Electronically Signed   By: Fidela Salisbury M.D.   On: 06/16/2016 08:40   Mm Diag Breast Tomo Uni Right  06/09/2016  CLINICAL DATA:  Status post stereotactic guided biopsy today for right breast distortion. EXAM: DIAGNOSTIC RIGHT MAMMOGRAM POST STEREOTACTIC BIOPSY COMPARISON:  Previous exam(s). FINDINGS: Mammographic images were obtained following stereotactic guided biopsy of distortion within the inner right breast. At the conclusion of the procedure, an X shaped tissue marker was placed at the biopsy site. This biopsy clip is well positioned at the biopsy site, corresponding to the original mammographically detected right breast distortion. IMPRESSION: Postprocedure mammogram for clip placement. Biopsy clip is well positioned within the inner right breast, corresponding to the site of the original mammographically detected right breast distortion. An additional coil shaped clip is present within the lower inner quadrant of  the right breast corresponding to a site of recently biopsied ADH. Final Assessment: Post Procedure Mammograms for Marker Placement Electronically Signed   By: Franki Cabot M.D.   On: 06/09/2016 10:54   Mm Diag Breast Tomo Uni Right  06/03/2016  CLINICAL DATA:  Patient is post low stereotactic core needle biopsy of a focal asymmetry/distortion over the inner lower right breast. EXAM: DIAGNOSTIC RIGHT MAMMOGRAM POST STEREOTACTIC BIOPSY COMPARISON:  Previous exam(s). FINDINGS: Mammographic images were obtained following stereotactic guided biopsy of a focal asymmetry/distortion over the inner lower right breast. Images demonstrate a coil shaped metallic clip over the inner lower right breast, however the focal asymmetry/distortion is located 2.8 cm anterior medial to the clip on the CC image and 3 cm anterior superior to the clip on the ML image. This focal distortion is much better seen on the post clip ML image when compared to the previous screening and diagnostic MLO views. IMPRESSION: The post clip coil shaped metallic clip does not correspond to the targeted focal asymmetry/ distortion over the inner mid to lower right breast as described. Final Assessment: Post Procedure Mammograms for Marker Placement RECOMMENDATIONS: Recommend Re-scheduling patient for repeat right breast stereotactic core needle biopsy of this focal distortion over the inner mid to lower right breast as described. Patient's biopsy scheduled for 06/08/2016. Electronically Signed   By: Marin Olp M.D.   On: 06/03/2016 14:23   Mm Rt Breast Bx W Loc Dev 1st Lesion Image Bx Spec Stereo Guide  06/10/2016  ADDENDUM REPORT: 06/10/2016 14:10 ADDENDUM: Pathology revealed GRADE I-II INVASIVE DUCTAL CARCINOMA, DUCTAL CARCINOMA IN SITU of the inner Right breast. This was found to be concordant by Dr. Franki Cabot. Pathology results were discussed with the patient by telephone. The patient reported doing well after the biopsy with tenderness at  the site. Post biopsy instructions and care were reviewed and questions were answered. The patient was encouraged to call [facility] for any additional concerns. Surgical consultation has been arranged with Dr. Fanny Skates at [facility], [facility] office, per patient request on June 17, 2016. The patient had a Right inner lower quadrant breast biopsy on June 03, 2016 showing FIBROCYSTIC CHANGES WITH CALCIFICATIONS and ATYPICAL DUCTAL HYPERPLASIA WITH CALCIFICATIONS, with excision recommended. Pathology  results reported by Terie Purser, RN on 06/10/2016. Electronically Signed   By: Franki Cabot M.D.   On: 06/10/2016 14:10  06/10/2016  CLINICAL DATA:  Patient presents today for stereotactic guided biopsy of distortion within the inner right breast. EXAM: RIGHT BREAST STEREOTACTIC CORE NEEDLE BIOPSY COMPARISON:  Previous exams. FINDINGS: The patient and I discussed the procedure of stereotactic-guided biopsy including benefits and alternatives. We discussed the high likelihood of a successful procedure. We discussed the risks of the procedure including infection, bleeding, tissue injury, clip migration, and inadequate sampling. Informed written consent was given. The usual time out protocol was performed immediately prior to the procedure. Using sterile technique and 1% Lidocaine as local anesthetic, under stereotactic guidance, a 9 gauge vacuum assisted device was used to perform core needle biopsy of distortion within the inner right breast using a medial approach. At the conclusion of the procedure, a X shaped tissue marker clip was deployed into the biopsy cavity. Follow-up 2-view mammogram was performed and dictated separately. IMPRESSION: Stereotactic-guided biopsy of distortion within the inner right breast. No apparent complications. Electronically Signed: By: Franki Cabot M.D. On: 06/09/2016 10:52   Mm Rt Breast Bx W Loc Dev 1st Lesion Image Bx Spec Stereo  Guide  06/04/2016  ADDENDUM REPORT: 06/04/2016 17:34 ADDENDUM: The final pathological diagnosis is atypical ductal hyperplasia with calcifications and fibrocystic changes with calcifications. This is concordant with the imaging findings. Mammographic guided needle localization and surgical excision of this area is recommended. The patient is scheduled for a 3D stereotactic guided core needle biopsy of a nearby area of architectural distortion in the inner mid to lower right breast at 9:30 a.m. on 06/09/2016. The final pathological diagnosis and recommendation were discussed with the patient by telephone on 06/04/2016. Her questions were answered. She reported no pain, bruising or palpable hematoma at the biopsy site. Electronically Signed   By: Claudie Revering M.D.   On: 06/04/2016 17:34  06/04/2016  CLINICAL DATA:  Patient presents for right breast stereotactic core needle biopsy of a focal asymmetry/ distortion over the inner lower breast. EXAM: RIGHT BREAST STEREOTACTIC CORE NEEDLE BIOPSY COMPARISON:  Previous exams. FINDINGS: The patient and I discussed the procedure of stereotactic-guided biopsy including benefits and alternatives. We discussed the high likelihood of a successful procedure. We discussed the risks of the procedure including infection, bleeding, tissue injury, clip migration, and inadequate sampling. Informed written consent was given. The usual time out protocol was performed immediately prior to the procedure. Using sterile technique and 1% Lidocaine as local anesthetic, under stereotactic guidance, a 9 gauge vacuum assisted biopsy device was used to perform core needle biopsy of the targeted asymmetry/distortion using a lateral to medial approach. The targeted asymmetry/ distortion was somewhat difficult to localize in the ML view as the distortion was never well seen at the time of screening nor diagnostic 3D examination in the MLO view. Five adequate tissue specimens were obtained. At the  conclusion of the procedure, a coil shaped tissue marker clip was deployed into the biopsy cavity. Follow-up 2-view mammogram was performed and dictated separately. IMPRESSION: Stereotactic-guided biopsy of right breast asymmetry/distortion. No apparent complications. Electronically Signed: By: Marin Olp M.D. 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 This was found to be concordant by Dr. Lajean Manes. Pathology results were discussed with the patient by  telephone. The patient reported doing well after the biopsy. Post biopsy instructions and care were reviewed and questions were answered. The patient was encouraged to call [facility] for any additional concerns. The patient has recently been diagnosed with invasive ductal carcinoma in the inner right breast and atypical ductal hyperplasia in the inner lower right breast. The patient should follow her outlined treatment plan, and has a follow-up appointment with Dr. Fanny Skates on July 09, 2016. Pathology results reported by Susa Raring RN, BSN on 06/18/2016. Electronically Signed   By: Lajean Manes M.D.   On: 06/18/2016 11:50  06/19/2016  CLINICAL DATA:  Patient presents for ultrasound-guided core needle biopsy of an abnormal right axillary lymph node. Patient has a known right breast carcinoma. EXAM: ULTRASOUND GUIDED CORE NEEDLE BIOPSY OF A RIGHT AXILLARY NODE COMPARISON:  Previous exam(s). FINDINGS: I met with the patient and we discussed the procedure of ultrasound-guided biopsy, including benefits and alternatives. We discussed the high likelihood of a successful procedure. We discussed the risks of the procedure, including infection, bleeding, tissue injury, clip migration, and inadequate sampling. Informed written consent was given. The usual time-out  protocol was performed immediately prior to the procedure. Using sterile technique and 1% Lidocaine as local anesthetic, under direct ultrasound visualization, a 14 gauge spring-loaded device was used to perform biopsy of the abnormal appearing lymph node using an inferior approach. At the conclusion of the procedure a HydroMARK tissue marker clip was deployed into the biopsy cavity. Follow up 2 view mammogram was performed and dictated separately. IMPRESSION: Ultrasound guided biopsy of an abnormal appearing right axillary lymph node. No apparent complications. Electronically Signed: By: Lajean Manes M.D. On: 06/17/2016 10:31   Diagnostic mammogram and ultrasound of right breast including right axillary 05/23/2016 IMPRESSION: Suspicious architectural distortion within the lower inner quadrant of the right breast, measuring 1.3 cm greatest dimension, without sonographic correlate. This distortion could conceivably be related to the patient's earlier surgical excision biopsy perform in 2001 (patient states that this earlier surgical excision was in this same region of her right breast), however, exclusion of a neoplastic cause is needed.  As such, stereotactic-guided biopsy, with 3D tomosynthesis, is recommended for this suspicious finding.   ASSESSMENT & PLAN: 75 year old Caucasian female, with mammogram discovered right breast cancer.  1. 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 -She probably has multifocal right breast cancer, with lymph node metastasis. The axillary lymph nodes are not palpable on clinical exam. -She has been seen by breast surgeon Dr. Dalbert Batman. All 3 lesions are in the same quadrant of right breast, hopefully she can still have lumpectomy, in stead of mastectomy. -I discussed the risk of cancer recurrence  after complete surgical resection. Given her nodal metastasis, her risk of recurrence is probably at least moderate, if not high. -Given them relatively small size of her individual tumors, If lumpectomy is feasible, I recommend her to have surgery first, and we have more accurate staging. -I discussed the role of tumor therapy, in the setting of neoadjuvant or adjuvant. Her tumor is strongly ER/PR positive, HER-2 negative, moderate Ki67. I recommend mammaprint genomic testing for further evaluate the risk of recurrence, and the benefit from chemotherapy. If it shows high risk, I would recommend chemotherapy.  -She would need postop breast and axilla radiation to reduce her  risk of local recurrence, giving the node positive disease. -Given the strong ER/PR positivity, I strongly recommend adjuvant endocrine therapy with aromatase inhibitor. The benefit and side effects were discussed with her. She is interested. -We finally discussed the breast cancer surveillance, after she completes surgery and radiation. She will have annual screening mammogram, routine office visit with exam and lab.   2. Hypertension and arthritis -She will continue medication and follow-up with her primary care physician  3. Morbid obesity -I encouraged her to have healthy diet and exercise regularly, try to lose some weight.  Plan -I will discuss with Dr. Wyonia Hough, she will likely have breast surgery first -Mammaprint on surgical tumor -I'll see her 3 weeks after her surgery to discuss her mammogram result and decide if she needs adjuvant chemotherapy -She was seen by our breast cancer navigator Dawn after my visit  All questions were answered. The patient knows to call the clinic with any problems, questions or concerns. I spent 55 minutes counseling the patient face to face. The total time spent in the appointment was 60 minutes and more than 50% was on counseling.     Truitt Merle, MD 06/27/2016 11:53 AM

## 2016-06-28 ENCOUNTER — Encounter: Payer: Self-pay | Admitting: Hematology

## 2016-06-30 ENCOUNTER — Telehealth: Payer: Self-pay | Admitting: Internal Medicine

## 2016-06-30 NOTE — Telephone Encounter (Signed)
[facility]  Patient Name: Tiffany Arnold  DOB: 04-30-1941    Initial Comment Caller states she thinks she has a pinched nerve, shooting pain down her right leg. It feels like it's going to have a [employer].   Nurse Assessment  Nurse: Wayne Sever, RN, Tillie Rung Date/Time (Eastern Time): 06/30/2016 9:16:57 AM  Confirm and document reason for call. If symptomatic, describe symptoms. You must click the next button to save text entered. ---Caller states she thinks she has pinched a nerve in the bottom of her spine. She states they recently found some cancer and she she is going to have surgery. She states she can hardly walk on one side because she is having pain shooting down her right leg. She states the pain is pretty bad and she has trouble walking.  Has the patient traveled out of the country within the last 30 days? ---Not Applicable  Does the patient have any new or worsening symptoms? ---Yes  Will a triage be completed? ---Yes  Related visit to physician within the last 2 weeks? ---No  Does the PT have any chronic conditions? (i.e. diabetes, asthma, etc.) ---Yes  List chronic conditions. ---Breast Cancer,  Is this a behavioral health or substance abuse call? ---No     Guidelines    Guideline Title Affirmed Question Affirmed Notes  Hip Pain [1] MODERATE pain (e.g., interferes with normal activities, limping) AND [2] present > 3 days    Final Disposition User   See PCP When Office is Open (within 3 days) Wayne Sever, RN, Tillie Rung    Comments  Scheduled on 07/11 with [name] at 400pm   Referrals  REFERRED TO PCP OFFICE   Disagree/Comply: Comply

## 2016-06-30 NOTE — Telephone Encounter (Signed)
Pt has appt on 07/01/16 at 4 with R Baity NP.

## 2016-07-01 ENCOUNTER — Ambulatory Visit: Payer: Self-pay | Admitting: Internal Medicine

## 2016-07-01 ENCOUNTER — Ambulatory Visit (INDEPENDENT_AMBULATORY_CARE_PROVIDER_SITE_OTHER): Payer: Medicare PPO | Admitting: Internal Medicine

## 2016-07-01 ENCOUNTER — Encounter: Payer: Self-pay | Admitting: Internal Medicine

## 2016-07-01 VITALS — BP 124/78 | HR 76 | Temp 99.0°F | Wt 244.0 lb

## 2016-07-01 DIAGNOSIS — M5441 Lumbago with sciatica, right side: Secondary | ICD-10-CM

## 2016-07-01 MED ORDER — METHOCARBAMOL 500 MG PO TABS
500.0000 mg | ORAL_TABLET | Freq: Every evening | ORAL | Status: DC | PRN
Start: 2016-07-01 — End: 2016-12-04

## 2016-07-01 NOTE — Progress Notes (Signed)
Pre visit review using our clinic review tool, if applicable. No additional management support is needed unless otherwise documented below in the visit note. 

## 2016-07-01 NOTE — Progress Notes (Signed)
Subjective:    Patient ID: Tiffany Arnold, female    DOB: Jan 11, 1941, 75 y.o.   MRN: [id number]  HPI  Pt presents to the clinic today with c/o back pain. This started 1 week ago. She describes the pain as stabbing. It does radiate into her right hip and down her right leg. She denies any injury to the area. She denies urinary or vaginal complaints. She has tried Ibuprofen OTC without any relief.  Review of Systems      Past Medical History  Diagnosis Date  . Asthma   . Arthritis   . Chicken pox   . Allergy   . Hypertension   . Hyperlipidemia   . Colon polyps   . Phlebitis     Current Outpatient Prescriptions  Medication Sig Dispense Refill  . albuterol (PROAIR HFA) 108 (90 BASE) MCG/ACT inhaler Inhale 2 puffs into the lungs every 6 (six) hours as needed.    Marland Kitchen albuterol-ipratropium (COMBIVENT) 18-103 MCG/ACT inhaler Inhale 2 puffs into the lungs every 4 (four) hours.    . ALPRAZolam (XANAX) 0.25 MG tablet TAKE ONE TABLET BY MOUTH TWICE DAILY AS NEEDED 60 tablet 0  . aspirin EC 81 MG tablet Take 81 mg by mouth daily.    . cetirizine (ZYRTEC) 10 MG tablet Take 10 mg by mouth daily.    . Cholecalciferol (D3-1000 PO) Take 1 capsule by mouth every morning.    Marland Kitchen EPINEPHrine 0.3 mg/0.3 mL IJ SOAJ injection Inject 0.3 mg into the muscle as needed.    Marland Kitchen glucosamine-chondroitin 500-400 MG tablet Take 1 tablet by mouth 2 (two) times daily.    . naproxen sodium (ALEVE) 220 MG tablet Take 2 tablets by mouth as needed.    . Olmesartan-Amlodipine-HCTZ 40-5-25 MG TABS TAKE ONE TABLET BY MOUTH ONCE DAILY 90 tablet 1  . omeprazole (PRILOSEC) 40 MG capsule Take 40 mg by mouth daily.    Marland Kitchen oxybutynin (DITROPAN-XL) 10 MG 24 hr tablet Take 1 tablet (10 mg total) by mouth at bedtime. 30 tablet 5  . pravastatin (PRAVACHOL) 40 MG tablet TAKE ONE TABLET BY MOUTH ONCE DAILY 30 tablet 5  . ranitidine (ZANTAC) 150 MG tablet Take 150 mg by mouth daily.    . Tuberculin-Allergy Syringes 27G X 1/2" 1 ML  KIT     . vitamin B-12 (CYANOCOBALAMIN) 1000 MCG tablet Take 1,000 mcg by mouth daily.     No current facility-administered medications for this visit.    Allergies  Allergen Reactions  . Other Swelling    Cats, dogs, mold    Family History  Problem Relation Age of Onset  . Arthritis Mother   . Stroke Mother   . Hypertension Mother   . Cancer Father     Prostate  . Stroke Father   . Hypertension Father   . Hypertension Maternal Grandmother   . Rheum arthritis Maternal Grandfather   . Stroke Maternal Grandfather   . Hypertension Maternal Grandfather   . Cancer Paternal Grandmother     Colon  . Hypertension Paternal Grandmother   . Hypertension Paternal Grandfather   . Breast cancer Neg Hx     Social History   Social History  . Marital Status: Married    Spouse Name: N/A  . Number of Children: N/A  . Years of Education: N/A   Occupational History  . Not on file.   Social History Main Topics  . Smoking status: Former Research scientist (life sciences)  . Smokeless tobacco: Not on file  Comment: quit 22 years ago  . Alcohol Use: Yes     Comment: rare  . Drug Use: No  . Sexual Activity: Not Currently   Other Topics Concern  . Not on file   Social History Narrative     Constitutional: Denies fever, malaise, fatigue, headache or abrupt weight changes.  Gastrointestinal: Denies abdominal pain, bloating, constipation, diarrhea or blood in the stool.  GU: Denies urgency, frequency, pain with urination, burning sensation, blood in urine, odor or discharge. Musculoskeletal: Pt reports low back pain. Denies difficulty with gait, muscle pain or joint  swelling.  Neurological: Denies dizziness, difficulty with memory, difficulty with speech or problems with balance and coordination.    No other specific complaints in a complete review of systems (except as listed in HPI above).  Objective:   Physical Exam   BP 124/78 mmHg  Pulse 76  Temp(Src) 99 F (37.2 C) (Oral)  Wt 244 lb  (110.678 kg)  SpO2 97% Wt Readings from Last 3 Encounters:  07/01/16 244 lb (110.678 kg)  06/27/16 244 lb 1.6 oz (110.723 kg)  05/02/16 247 lb 12 oz (112.379 kg)    General: Appears her stated age, obese in NAD. Abdomen: Soft and nontender. Normal bowel sounds Musculoskeletal:  Normal flexion, extension and rotation of the spine. No bony tenderness noted over the spine. Strength 5/5 BLE. Negative SLR of the RLE. No difficulty with gait.  Neurological: Alert and oriented. Sensation intact to BLE.  BMET    Component Value Date/Time   NA 138 04/02/2016 1441   K 3.8 04/02/2016 1441   CL 100 04/02/2016 1441   CO2 35* 04/02/2016 1441   GLUCOSE 170* 04/02/2016 1441   BUN 22 04/02/2016 1441   CREATININE 0.80 04/02/2016 1441   CALCIUM 10.1 04/02/2016 1441    Lipid Panel     Component Value Date/Time   CHOL 186 04/02/2016 1441   TRIG 234.0* 04/02/2016 1441   HDL 46.90 04/02/2016 1441   CHOLHDL 4 04/02/2016 1441   VLDL 46.8* 04/02/2016 1441   LDLCALC 100* 10/10/2015 1447    CBC    Component Value Date/Time   WBC 8.3 04/02/2016 1441   RBC 4.81 04/02/2016 1441   HGB 13.7 04/02/2016 1441   HCT 41.6 04/02/2016 1441   PLT 310.0 04/02/2016 1441   MCV 86.4 04/02/2016 1441   MCHC 32.9 04/02/2016 1441   RDW 13.9 04/02/2016 1441    Hgb A1C Lab Results  Component Value Date   HGBA1C 7.8* 04/02/2016        Assessment & Plan:   Low back pain with right side sciatica:  Back exercises given Continue Ibuprofen eRx for low dose muscle relaxer to take at night If persist, will pursue imaging/physical therapy  RTC as needed or if symptoms persist or worsen Abisai Deer, NP

## 2016-07-01 NOTE — Patient Instructions (Signed)

## 2016-07-03 ENCOUNTER — Telehealth: Payer: Self-pay

## 2016-07-03 NOTE — Telephone Encounter (Signed)
Pt left /vm; pt was seen 07/01/16; pt continues with rt hip dull pain that runs down rt leg; pt is going to have breast surgery in the next couple of weeks and wanted another med to get rid of pain. [address] rd.pt request cb.

## 2016-07-03 NOTE — Telephone Encounter (Signed)
Did she try to muscle relaxer?

## 2016-07-04 NOTE — Telephone Encounter (Signed)
Left message on voicemail.

## 2016-07-04 NOTE — Telephone Encounter (Addendum)
Pt's spouse dropped by and stated that pt is gritting her teeth every time she takes a step and that she can hardly walk. The muscle relaxer isn't touching it. They believe it's the sciatic nerve that's troubling her.

## 2016-07-07 NOTE — Telephone Encounter (Signed)
Phone in Tramadol 50 mg tabs to take every 8 hours prn for severe pain. #30 , 0 refills

## 2016-07-07 NOTE — Telephone Encounter (Signed)
Pt returned your call Pt stated she used muscle relaxer  for 2 nights she stated it didn't help  Best number [phone]

## 2016-07-09 ENCOUNTER — Other Ambulatory Visit: Payer: Self-pay | Admitting: General Surgery

## 2016-07-09 DIAGNOSIS — Z9889 Other specified postprocedural states: Secondary | ICD-10-CM | POA: Diagnosis not present

## 2016-07-09 DIAGNOSIS — E119 Type 2 diabetes mellitus without complications: Secondary | ICD-10-CM | POA: Diagnosis not present

## 2016-07-09 DIAGNOSIS — I10 Essential (primary) hypertension: Secondary | ICD-10-CM | POA: Diagnosis not present

## 2016-07-09 DIAGNOSIS — J309 Allergic rhinitis, unspecified: Secondary | ICD-10-CM | POA: Diagnosis not present

## 2016-07-09 DIAGNOSIS — C50311 Malignant neoplasm of lower-inner quadrant of right female breast: Secondary | ICD-10-CM | POA: Diagnosis not present

## 2016-07-09 DIAGNOSIS — Z6841 Body Mass Index (BMI) 40.0 and over, adult: Secondary | ICD-10-CM | POA: Diagnosis not present

## 2016-07-09 DIAGNOSIS — F329 Major depressive disorder, single episode, unspecified: Secondary | ICD-10-CM | POA: Diagnosis not present

## 2016-07-09 DIAGNOSIS — Z96653 Presence of artificial knee joint, bilateral: Secondary | ICD-10-CM | POA: Diagnosis not present

## 2016-07-09 DIAGNOSIS — F419 Anxiety disorder, unspecified: Secondary | ICD-10-CM | POA: Diagnosis not present

## 2016-07-09 NOTE — Telephone Encounter (Signed)
Rx called in to pharmacy. 

## 2016-07-15 ENCOUNTER — Other Ambulatory Visit: Payer: Self-pay | Admitting: Internal Medicine

## 2016-07-15 NOTE — Telephone Encounter (Signed)
Rx called in to pharmacy. 

## 2016-07-15 NOTE — Telephone Encounter (Signed)
Ok to phone in Xanax 

## 2016-07-15 NOTE — Telephone Encounter (Signed)
Last filled 06/16/16--please advise

## 2016-07-16 NOTE — Telephone Encounter (Signed)
She needs to make a follow up appt to reassess and discuss

## 2016-07-16 NOTE — Telephone Encounter (Signed)
Pt has appt scheduled for tomorrow 5:00pm

## 2016-07-16 NOTE — Telephone Encounter (Signed)
Pt called back stating she is still in pain and wanted to know what she needed to do Best number [phone]

## 2016-07-17 ENCOUNTER — Ambulatory Visit (INDEPENDENT_AMBULATORY_CARE_PROVIDER_SITE_OTHER): Payer: Medicare PPO | Admitting: Internal Medicine

## 2016-07-17 ENCOUNTER — Ambulatory Visit (INDEPENDENT_AMBULATORY_CARE_PROVIDER_SITE_OTHER)
Admission: RE | Admit: 2016-07-17 | Discharge: 2016-07-17 | Disposition: A | Discharge status: Home / Self Care | Payer: Medicare PPO | Source: Ambulatory Visit | Attending: Internal Medicine | Admitting: Internal Medicine

## 2016-07-17 ENCOUNTER — Other Ambulatory Visit: Payer: Self-pay | Admitting: General Surgery

## 2016-07-17 ENCOUNTER — Encounter: Payer: Self-pay | Admitting: Internal Medicine

## 2016-07-17 VITALS — BP 130/70 | Temp 98.1°F | Wt 246.0 lb

## 2016-07-17 DIAGNOSIS — M5441 Lumbago with sciatica, right side: Secondary | ICD-10-CM

## 2016-07-17 DIAGNOSIS — C50311 Malignant neoplasm of lower-inner quadrant of right female breast: Secondary | ICD-10-CM

## 2016-07-17 DIAGNOSIS — M4187 Other forms of scoliosis, lumbosacral region: Secondary | ICD-10-CM | POA: Diagnosis not present

## 2016-07-17 MED ORDER — PREDNISONE 10 MG PO TABS: ORAL_TABLET | ORAL | 0 refills | Status: DC

## 2016-07-17 MED ORDER — HYDROCODONE-ACETAMINOPHEN 5-325 MG PO TABS: 1.0000 | ORAL_TABLET | Freq: Three times a day (TID) | ORAL | 0 refills | Status: DC | PRN

## 2016-07-17 NOTE — Patient Instructions (Signed)

## 2016-07-17 NOTE — Progress Notes (Signed)
Subjective:    Patient ID: Tiffany Arnold, female    DOB: [birth year], 75 y.o.   MRN: [id number]  HPI  Pt presents to the clinic today with a complaint of continued lower back pain.  She was seen on 07/01/16 for back pain x 1 week, and prescribed a muscle relaxer which she tried for 2 nights with no relief.  After notification from her husband that her symptoms were not improving, a prescription for Tramadol was called in for her, but the patient states she never received that message and did not pick up the medication from the pharmacy.  Today she states the pain has not improved, and she continues to have burning, stabbing pain with radiation to the right hip and down the leg to the right foot.  She states the pain worsens with positional changes, and improves when staying still and with continued activity.  She denies numbness or tingling.  She has previously tried Ibuprofen OTC without any relief.  She reports a history of back surgery for a herniated disc in 1977, and states these symptoms are different than those she experienced at that time.      Review of Systems      Past Medical History:  Diagnosis Date  . Allergy   . Arthritis   . Asthma   . Chicken pox   . Colon polyps   . Hyperlipidemia   . Hypertension   . Phlebitis     Current Outpatient Prescriptions  Medication Sig Dispense Refill  . albuterol (PROAIR HFA) 108 (90 BASE) MCG/ACT inhaler Inhale 2 puffs into the lungs every 6 (six) hours as needed.    Marland Kitchen albuterol-ipratropium (COMBIVENT) 18-103 MCG/ACT inhaler Inhale 2 puffs into the lungs every 4 (four) hours.    . ALPRAZolam (XANAX) 0.25 MG tablet TAKE ONE TABLET BY MOUTH TWICE DAILY AS NEEDED 60 tablet 0  . aspirin EC 81 MG tablet Take 81 mg by mouth daily.    . cetirizine (ZYRTEC) 10 MG tablet Take 10 mg by mouth daily.    . Cholecalciferol (D3-1000 PO) Take 1 capsule by mouth every morning.    Marland Kitchen EPINEPHrine 0.3 mg/0.3 mL IJ SOAJ injection Inject 0.3 mg into the  muscle as needed.    Marland Kitchen glucosamine-chondroitin 500-400 MG tablet Take 1 tablet by mouth 2 (two) times daily.    . methocarbamol (ROBAXIN) 500 MG tablet Take 1 tablet (500 mg total) by mouth at bedtime as needed for muscle spasms. 10 tablet 0  . naproxen sodium (ALEVE) 220 MG tablet Take 2 tablets by mouth as needed.    . Olmesartan-Amlodipine-HCTZ 40-5-25 MG TABS TAKE ONE TABLET BY MOUTH ONCE DAILY 90 tablet 1  . omeprazole (PRILOSEC) 40 MG capsule Take 40 mg by mouth daily.    Marland Kitchen oxybutynin (DITROPAN-XL) 10 MG 24 hr tablet Take 1 tablet (10 mg total) by mouth at bedtime. 30 tablet 5  . pravastatin (PRAVACHOL) 40 MG tablet TAKE ONE TABLET BY MOUTH ONCE DAILY 30 tablet 5  . ranitidine (ZANTAC) 150 MG tablet Take 150 mg by mouth daily.    . Tuberculin-Allergy Syringes 27G X 1/2" 1 ML KIT     . vitamin B-12 (CYANOCOBALAMIN) 1000 MCG tablet Take 1,000 mcg by mouth daily.    Marland Kitchen HYDROcodone-acetaminophen (NORCO/VICODIN) 5-325 MG tablet Take 1 tablet by mouth every 8 (eight) hours as needed for moderate pain or severe pain. 30 tablet 0  . predniSONE (DELTASONE) 10 MG tablet Take 3 tabs on days 1-3,  take 2 tabs on days 4-6, take 1 tab on days 7-9 18 tablet 0   No current facility-administered medications for this visit.     Allergies  Allergen Reactions  . Other Swelling    Cats, dogs, mold    Family History  Problem Relation Age of Onset  . Arthritis Mother   . Stroke Mother   . Hypertension Mother   . Cancer Father     Prostate  . Stroke Father   . Hypertension Father   . Hypertension Maternal Grandmother   . Rheum arthritis Maternal Grandfather   . Stroke Maternal Grandfather   . Hypertension Maternal Grandfather   . Cancer Paternal Grandmother     Colon  . Hypertension Paternal Grandmother   . Hypertension Paternal Grandfather   . Breast cancer Neg Hx     Social History   Social History  . Marital status: Married    Spouse name: N/A  . Number of children: N/A  . Years of  education: N/A   Occupational History  . Not on file.   Social History Main Topics  . Smoking status: Former Research scientist (life sciences)  . Smokeless tobacco: Never Used     Comment: quit 22 years ago  . Alcohol use Yes     Comment: rare  . Drug use: No  . Sexual activity: Not Currently   Other Topics Concern  . Not on file   Social History Narrative  . No narrative on file     Constitutional: Denies fever, malaise, fatigue, headache or abrupt weight changes.  Gastrointestinal: Denies abdominal pain, bloating, constipation, diarrhea or blood in the stool.  GU: Denies urgency, frequency, pain with urination, burning sensation, blood in urine, odor or discharge. Musculoskeletal: Pt reports low back pain. Denies difficulty with gait, muscle pain or joint  swelling.  Neurological: Denies dizziness, difficulty with memory, difficulty with speech or problems with balance and coordination.    No other specific complaints in a complete review of systems (except as listed in HPI above).  Objective:   Physical Exam    BP 130/70   Temp 98.1 F (36.7 C) (Oral)   Wt 246 lb (111.6 kg)   SpO2 95%   BMI 46.48 kg/m  Wt Readings from Last 3 Encounters:  07/17/16 246 lb (111.6 kg)  07/01/16 244 lb (110.7 kg)  06/27/16 244 lb 1.6 oz (110.7 kg)    General: Appears her stated age, obese in NAD. Skin: No rashes present. Musculoskeletal:  No tenderness to palpation of spine or paraspinals.  Normal flexion, lateral flexion, and rotation of the spine, limited extension. Full AROM of hips.  Strength 5/5 BLE. Negative SLR.  Slightly wobbly gait.   Neurological: Alert and oriented. Sensation to light touch intact to BLE.  BMET    Component Value Date/Time   NA 138 04/02/2016 1441   K 3.8 04/02/2016 1441   CL 100 04/02/2016 1441   CO2 35 (H) 04/02/2016 1441   GLUCOSE 170 (H) 04/02/2016 1441   BUN 22 04/02/2016 1441   CREATININE 0.80 04/02/2016 1441   CALCIUM 10.1 04/02/2016 1441    Lipid Panel       Component Value Date/Time   CHOL 186 04/02/2016 1441   TRIG 234.0 (H) 04/02/2016 1441   HDL 46.90 04/02/2016 1441   CHOLHDL 4 04/02/2016 1441   VLDL 46.8 (H) 04/02/2016 1441   LDLCALC 100 (H) 10/10/2015 1447    CBC    Component Value Date/Time   WBC 8.3 04/02/2016 1441  RBC 4.81 04/02/2016 1441   HGB 13.7 04/02/2016 1441   HCT 41.6 04/02/2016 1441   PLT 310.0 04/02/2016 1441   MCV 86.4 04/02/2016 1441   MCHC 32.9 04/02/2016 1441   RDW 13.9 04/02/2016 1441    Hgb A1C Lab Results  Component Value Date   HGBA1C 7.8 (H) 04/02/2016        Assessment & Plan:   Low back pain with right side sciatica:  Will Xray spine today eRx for Prednisone taper- monitor blood sugars RX for Norco 1 tablet q8hrs as needed for pain If persist or worsens, may need to get MRI  RTC as needed or if symptoms persist or worsen BAITY, REGINA, NP

## 2016-07-17 NOTE — Progress Notes (Signed)
Pre visit review using our clinic review tool, if applicable. No additional management support is needed unless otherwise documented below in the visit note. 

## 2016-07-21 ENCOUNTER — Institutional Professional Consult (permissible substitution): Payer: Medicare PPO | Admitting: Internal Medicine

## 2016-07-21 ENCOUNTER — Encounter: Payer: Self-pay | Admitting: *Deleted

## 2016-07-21 NOTE — Pre-Procedure Instructions (Signed)
DAIONNA NANDA  07/21/2016     Your procedure is scheduled on : Tuesday July 29, 2016 at 11:30 AM.  Report to [facility] Admitting at 9:25 AM.  Call this number if you have problems the morning of surgery: [phone]    Remember:  Do not eat food or drink liquids after midnight.  Take these medicines the morning of surgery with A SIP OF WATER : Albuterol inhaler if needed (bring inhaler with you), Combivent inhaler if needed, Alprazolam (Xanax) if needed, Cetirizine (Zyrtec), Hydrocodone if needed, Prednisone, Ranitidine (Zantac)  Stop taking any vitamins, herbal medications/supplements, NSAIDs, Ibuprofen, Advil, Motrin, Naproxen/ Aleve, Omega 3/Fish oil, etc today   Bring CPAP machine the day of surgery   Do not wear jewelry, make-up or nail polish.  Do not wear lotions, powders, or perfumes.    Do not shave 48 hours prior to surgery.    Do not bring valuables to the hospital.  [facility] is not responsible for any belongings or valuables.  Contacts, dentures or bridgework may not be worn into surgery.  Leave your suitcase in the car.  After surgery it may be brought to your room.  For patients admitted to the hospital, discharge time will be determined by your treatment team.  Patients discharged the day of surgery will not be allowed to drive home.   Name and phone number of your driver:    Special instructions:  Shower using CHG soap the night before and the morning of your surgery  Please read over the following fact sheets that you were given.

## 2016-07-22 ENCOUNTER — Telehealth: Payer: Self-pay

## 2016-07-22 ENCOUNTER — Telehealth: Payer: Self-pay | Admitting: Hematology

## 2016-07-22 NOTE — Telephone Encounter (Signed)
Pt called to clarify appt

## 2016-07-22 NOTE — Telephone Encounter (Signed)
lvm to inform pt of 8/28 appt date/time per pof

## 2016-07-23 ENCOUNTER — Encounter (HOSPITAL_COMMUNITY)
Admission: RE | Admit: 2016-07-23 | Discharge: 2016-07-23 | Disposition: A | Discharge status: Home / Self Care | Payer: Medicare PPO | Source: Ambulatory Visit | Attending: General Surgery | Admitting: General Surgery

## 2016-07-23 ENCOUNTER — Encounter (HOSPITAL_COMMUNITY): Payer: Self-pay

## 2016-07-23 ENCOUNTER — Ambulatory Visit (HOSPITAL_COMMUNITY)
Admission: RE | Admit: 2016-07-23 | Discharge: 2016-07-23 | Disposition: A | Discharge status: Home / Self Care | Payer: Medicare PPO | Source: Ambulatory Visit | Attending: General Surgery | Admitting: General Surgery

## 2016-07-23 DIAGNOSIS — I10 Essential (primary) hypertension: Secondary | ICD-10-CM | POA: Insufficient documentation

## 2016-07-23 DIAGNOSIS — Z7952 Long term (current) use of systemic steroids: Secondary | ICD-10-CM | POA: Diagnosis not present

## 2016-07-23 DIAGNOSIS — Z87891 Personal history of nicotine dependence: Secondary | ICD-10-CM | POA: Diagnosis not present

## 2016-07-23 DIAGNOSIS — J45909 Unspecified asthma, uncomplicated: Secondary | ICD-10-CM | POA: Diagnosis not present

## 2016-07-23 DIAGNOSIS — E785 Hyperlipidemia, unspecified: Secondary | ICD-10-CM | POA: Diagnosis not present

## 2016-07-23 DIAGNOSIS — I700 Atherosclerosis of aorta: Secondary | ICD-10-CM | POA: Diagnosis not present

## 2016-07-23 DIAGNOSIS — Z7982 Long term (current) use of aspirin: Secondary | ICD-10-CM | POA: Insufficient documentation

## 2016-07-23 DIAGNOSIS — G4733 Obstructive sleep apnea (adult) (pediatric): Secondary | ICD-10-CM | POA: Diagnosis not present

## 2016-07-23 DIAGNOSIS — C50911 Malignant neoplasm of unspecified site of right female breast: Secondary | ICD-10-CM | POA: Diagnosis not present

## 2016-07-23 DIAGNOSIS — Z01812 Encounter for preprocedural laboratory examination: Secondary | ICD-10-CM | POA: Diagnosis not present

## 2016-07-23 DIAGNOSIS — Z79899 Other long term (current) drug therapy: Secondary | ICD-10-CM | POA: Diagnosis not present

## 2016-07-23 DIAGNOSIS — Z01818 Encounter for other preprocedural examination: Secondary | ICD-10-CM | POA: Insufficient documentation

## 2016-07-23 HISTORY — DX: Irritable bowel syndrome, unspecified: K58.9

## 2016-07-23 LAB — CBC WITH DIFFERENTIAL/PLATELET
Basophils Absolute: 0 10*3/uL (ref 0.0–0.1)
Basophils Relative: 0 %
EOS ABS: 0 10*3/uL (ref 0.0–0.7)
EOS PCT: 0 %
HCT: 44.2 % (ref 36.0–46.0)
Hemoglobin: 14.3 g/dL (ref 12.0–15.0)
LYMPHS ABS: 3.4 10*3/uL (ref 0.7–4.0)
Lymphocytes Relative: 21 %
MCH: 28.9 pg (ref 26.0–34.0)
MCHC: 32.4 g/dL (ref 30.0–36.0)
MCV: 89.5 fL (ref 78.0–100.0)
MONO ABS: 0.9 10*3/uL (ref 0.1–1.0)
MONOS PCT: 6 %
Neutro Abs: 11.6 10*3/uL — ABNORMAL HIGH (ref 1.7–7.7)
Neutrophils Relative %: 73 %
PLATELETS: 337 10*3/uL (ref 150–400)
RBC: 4.94 MIL/uL (ref 3.87–5.11)
RDW: 13.2 % (ref 11.5–15.5)
WBC: 16 10*3/uL — ABNORMAL HIGH (ref 4.0–10.5)

## 2016-07-23 LAB — BASIC METABOLIC PANEL
Anion gap: 11 (ref 5–15)
BUN: 16 mg/dL (ref 6–20)
CHLORIDE: 98 mmol/L — AB (ref 101–111)
CO2: 27 mmol/L (ref 22–32)
CREATININE: 0.69 mg/dL (ref 0.44–1.00)
Calcium: 10.4 mg/dL — ABNORMAL HIGH (ref 8.9–10.3)
GFR calc Af Amer: >60 mL/min (ref >60)
GFR calc non Af Amer: >60 mL/min (ref >60)
GLUCOSE: 129 mg/dL — AB (ref 65–99)
Potassium: 3.9 mmol/L (ref 3.5–5.1)
Sodium: 136 mmol/L (ref 135–145)

## 2016-07-23 MED ORDER — CHLORHEXIDINE GLUCONATE CLOTH 2 % EX PADS: 6.0000 | MEDICATED_PAD | Freq: Once | CUTANEOUS | Status: DC

## 2016-07-23 NOTE — Progress Notes (Signed)
PCP is [name], NP  Patient denied having any acute cardiac or pulmonary issues, but did inform Nurse that she had a stress test approximately six years ago, however patient was unaware of whom or where the stress test was performed. Patient stated "I had to have a stress test before I had surgery." Patient denied having a cardiac cath, but did inform Nurse that she has sleep apnea and wears CPAP nightly. Patient stated she is scheduled to have another sleep study on 08/05/16.   During PAT visit patient informed Nurse that she was bitten by a dog while at work today. Patient stated it was "sore" and stated the Veterinarian "cleaned and bandaged it up" for her. Slight redness noted around the knuckle on the left index finger. Band-Aid and small dressing also noted. No visible drainage noted. Patient instructed to call her PCP and inform Dr. Dalbert Batman if the bite becomes worse, or is she develops any fever. Patient verbalized understanding. Will call and inform Dr. Dalbert Batman of this.   Patient was accompanied to PAT by her sister [name]. All questions answered to the best of my ability.

## 2016-07-23 NOTE — Progress Notes (Signed)
Nurse called April at Dr. Darrel Hoover office and informed her that patient was bitten by a dog on her left index finger today while at work. April stated she would inform Dr. Dalbert Batman of this.

## 2016-07-24 DIAGNOSIS — S61452A Open bite of left hand, initial encounter: Secondary | ICD-10-CM | POA: Diagnosis not present

## 2016-07-24 NOTE — Progress Notes (Signed)
Anesthesia Chart Review:  Pt is a 75 year old female scheduled for R breast lumpectomy with double needle localization and complete R axillary lymph node dissection on 07/29/2016 with Fanny Skates, MD.   PCP is [name], NP  PMH includes:  HTN, hyperlipidemia, OSA, asthma. Former smoker. BMI 45  Pt was bitten by a dog at her work (at [employer] office) prior to PAT 07/23/16 and subsequently f/u with PCP and started on augmentin (see notes in care everywhere).   Medications include: albuterol, combivent, ASA, olmesartan-hctz, pravastatin, prednisone, zantac  Preoperative labs reviewed.  WBC 16. Dr. Dalbert Batman is aware. Pt has been started on augmentin for dog bite.   Chest x-ray 07/23/16 reviewed. No active cardiopulmonary disease.  EKG 07/23/16: NSR.   If no changes, I anticipate pt can proceed with surgery as scheduled.   Willeen Cass, FNP-BC [facility]/Anesthesiology Phone: [phone] 07/24/2016 4:44 PM

## 2016-07-27 NOTE — H&P (Signed)
Tiffany Arnold Location: [facility] Patient #: [id number] DOB: [birth year] Married / Language: English / Race: White Female        History of Present Illness  The patient is a 75 year old female who presents with breast cancer. This is a very pleasant 75 year old Caucasian female. 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 Her PCP is Dr. Bjorn Loser. I discussed her case with Dr. Enriqueta Shutter and Dr. Radford Pax at the breast center of [place].  No major breast problems in the past. Gets annual mammograms. On June 13 she had a biopsy in the lower inner right breast. The clip did not deploy and the biopsy showed atypical ductal hyperplasia. 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 There was an abnormal lymph node which was biopsied and that is also positive for metastatic cancer. She has seen Dr. Burr Medico who hopes that we can do lumpectomy and axillary lymph node dissection. Mammogram will be sent. Neither of [place] think she would respond to neoadjuvant chemotherapy. I discussed her imaging findings with Dr. Curlene Dolphin. It looks like on MRI there is rim enhancement in 3 locations in the right breast, relatively close to each other in the lower inner right breast spanning a 4.5 cm area medial to lateral. We think we can get this out with a double wire bracketed localization. She will need a complete axillary lymph node dissection.  Comorbidities include obesity, borderline diabetes, hypertension, GERD, anxiety on Xanax, allergic rhinitis, history of lumbar back surgery and bilateral knee replacements and umbilical hernia repair. Family history is negative for breast cancer syndromes. She lives in [place] once her radiation therapy there  We had a one-hour talk about how to manage  her. We talked about mastectomy and axillary lymph node dissection, with or without reconstruction. We talked about double wire bracketed lumpectomy with axillary lymph node dissection with or without plastic surgical revision. She thinks she wants to go with the lumpectomy with the 2 wires and axillary lymph node dissection. She declines referral to plastic surgery.  I discussed the indications, details, techniques, numerous risk of the surgery with her and her sister and her daughter. She is aware the risks of bleeding, infection, cosmetic deformity which may be minor or major. This may require revision. She is aware of the possible need for a second operation if we have positive margins. She noted she is going to get a complete axillary lymph node dissection with significant risk of arm swelling and sensory deficit. She no she will be offered radiation therapy and antiestrogen therapy. She knows that she may or may not need chemotherapy. She is comfortable with all of this. She is ready to go ahead and schedule surgery.  She wants to have her radiation therapy in [place].    Allergies  No Known Drug Allergies06/27/2017  Medication History ALPRAZolam (0.25MG Tablet, Oral) Active. Olmesartan-Amlodipine-HCTZ (40-5-25MG Tablet, Oral) Active. Oxybutynin Chloride ER (10MG Tablet ER 24HR, Oral) Active. Pravastatin Sodium (40MG Tablet, Oral) Active. Aspirin (81MG Tablet, Oral) Active. Cetirizine HCl (10MG Tablet, Oral) Active. Vitamin D3 (1000UNIT Capsule, Oral) Active. Glucosamine Chondroitin Complx (Oral) Active. EPINEPHrine (0.3MG/0.3ML Soln Auto-inj, Injection) Active. Omeprazole (40MG Capsule DR, Oral) Active. RaNITidine HCl (150MG Capsule, Oral) Active. Vitamin B12 (1000MCG Tablet ER, Oral) Active. CloNIDine HCl (0.1MG Tablet, Oral) Active. Oxybutynin Chloride (10MG Tablet ER, Oral) Active.  Medications Reconciled  Vitals  Weight: 238 lb Height:  61in Body Surface Area: 2.03 m Body Mass Index: 44.97 kg/m  Temp.: 97.73F(Temporal)  Pulse: 86 (Regular)  BP: 126/82 (Sitting, Left Arm, Standard)     Physical Exam  General Mental Status-Alert. General Appearance-Consistent with stated age. Hydration-Well hydrated. Voice-Normal.  Head and Neck Head-normocephalic, atraumatic with no lesions or palpable masses. Trachea-midline. Thyroid Gland Characteristics - normal size and consistency.  Eye Eyeball - Bilateral-Extraocular movements intact. Sclera/Conjunctiva - Bilateral-No scleral icterus.  Chest and Lung Exam Chest and lung exam reveals -quiet, even and easy respiratory effort with no use of accessory muscles and on auscultation, normal breath sounds, no adventitious sounds and normal vocal resonance. Inspection Chest Wall - Normal. Back - normal.  Breast Note: Breasts are large but pendulous and ptotic. No palpable mass or hematoma. No infection. I can't palpate any axillary lymph nodes.   Cardiovascular Cardiovascular examination reveals -normal heart sounds, regular rate and rhythm with no murmurs and normal pedal pulses bilaterally.  Abdomen Inspection Inspection of the abdomen reveals - No Hernias. Skin - Scar - no surgical scars. Palpation/Percussion Palpation and Percussion of the abdomen reveal - Soft, Non Tender, No Rebound tenderness, No Rigidity (guarding) and No hepatosplenomegaly. Auscultation Auscultation of the abdomen reveals - Bowel sounds normal.  Neurologic Neurologic evaluation reveals -alert and oriented x 3 with no impairment of recent or remote memory. Mental Status-Normal.  Neuropsychiatric Note: Some anxiety but oriented, cooperative, has good insight and understanding of the information and its implications for her treatment. She is more than competent to make her own decisions.   Musculoskeletal Normal Exam - Left-Upper Extremity Strength  Normal and Lower Extremity Strength Normal. Normal Exam - Right-Upper Extremity Strength Normal and Lower Extremity Strength Normal.  Lymphatic Head & Neck  General Head & Neck Lymphatics: Bilateral - Description - Normal. Axillary  General Axillary Region: Bilateral - Description - Normal. Tenderness - Non Tender. Femoral & Inguinal  Generalized Femoral & Inguinal Lymphatics: Bilateral - Description - Normal. Tenderness - Non Tender.    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 There may be a third spot You have a biopsy-proven right axillary lymph node showing metastatic cancer You have the option of lumpectomy and complete axillary lymph node dissection versus mastectomy with complete axillary lymph node dissection, with or without plastic surgical revision. We have discussed this extensively  You have met with Dr. Burr Medico at the cancer center who has discussed options as well  We have decided to proceed with right breast lumpectomy with double wire needle localization. Right axillary lymph node dissection will be performed You have declined plastic surgical consultation at this time. This means that the day of surgery you will go to the [facility] [place] and they will place 2 wires, one medially and one laterally to bracket the area of disease in your right breast which is limited to the lower inner quadrant you will then have the surgery as described  We've discussed the indications techniques and numerous risk of the surgery in detail. 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 General and Minimally invasive Surgery Breast and Colorectal Surgery Office:   [phone] Pager:   [phone]

## 2016-07-28 MED ORDER — DEXTROSE 5 % IV SOLN
3.0000 g | INTRAVENOUS | Status: AC
  Administered 2016-07-29: 3 g via INTRAVENOUS
  Filled 2016-07-28: qty 3000

## 2016-07-29 ENCOUNTER — Ambulatory Visit
Admission: RE | Admit: 2016-07-29 | Discharge: 2016-07-29 | Disposition: A | Discharge status: Home / Self Care | Payer: Medicare PPO | Source: Ambulatory Visit | Attending: General Surgery | Admitting: General Surgery

## 2016-07-29 ENCOUNTER — Ambulatory Visit (HOSPITAL_COMMUNITY): Payer: Medicare PPO | Admitting: Anesthesiology

## 2016-07-29 ENCOUNTER — Encounter (HOSPITAL_COMMUNITY): Payer: Self-pay | Admitting: *Deleted

## 2016-07-29 ENCOUNTER — Encounter (HOSPITAL_COMMUNITY)
Admission: RE | Disposition: A | Discharge status: Home / Self Care | Payer: Self-pay | Source: Ambulatory Visit | Attending: General Surgery

## 2016-07-29 ENCOUNTER — Ambulatory Visit (HOSPITAL_COMMUNITY)
Admission: RE | Admit: 2016-07-29 | Discharge: 2016-07-30 | Disposition: A | Discharge status: Home / Self Care | Payer: Medicare PPO | Source: Ambulatory Visit | Attending: General Surgery | Admitting: General Surgery

## 2016-07-29 ENCOUNTER — Ambulatory Visit (HOSPITAL_COMMUNITY): Payer: Medicare PPO | Admitting: Emergency Medicine

## 2016-07-29 DIAGNOSIS — J45909 Unspecified asthma, uncomplicated: Secondary | ICD-10-CM | POA: Insufficient documentation

## 2016-07-29 DIAGNOSIS — Z7982 Long term (current) use of aspirin: Secondary | ICD-10-CM | POA: Insufficient documentation

## 2016-07-29 DIAGNOSIS — C50911 Malignant neoplasm of unspecified site of right female breast: Secondary | ICD-10-CM | POA: Diagnosis not present

## 2016-07-29 DIAGNOSIS — D0511 Intraductal carcinoma in situ of right breast: Secondary | ICD-10-CM | POA: Diagnosis not present

## 2016-07-29 DIAGNOSIS — C50311 Malignant neoplasm of lower-inner quadrant of right female breast: Secondary | ICD-10-CM

## 2016-07-29 DIAGNOSIS — Z79899 Other long term (current) drug therapy: Secondary | ICD-10-CM | POA: Diagnosis not present

## 2016-07-29 DIAGNOSIS — Z96653 Presence of artificial knee joint, bilateral: Secondary | ICD-10-CM | POA: Diagnosis not present

## 2016-07-29 DIAGNOSIS — F419 Anxiety disorder, unspecified: Secondary | ICD-10-CM | POA: Insufficient documentation

## 2016-07-29 DIAGNOSIS — K219 Gastro-esophageal reflux disease without esophagitis: Secondary | ICD-10-CM | POA: Diagnosis not present

## 2016-07-29 DIAGNOSIS — Z170 Estrogen receptor positive status [ER+]: Secondary | ICD-10-CM | POA: Diagnosis not present

## 2016-07-29 DIAGNOSIS — I10 Essential (primary) hypertension: Secondary | ICD-10-CM | POA: Insufficient documentation

## 2016-07-29 DIAGNOSIS — Z6841 Body Mass Index (BMI) 40.0 and over, adult: Secondary | ICD-10-CM | POA: Diagnosis not present

## 2016-07-29 DIAGNOSIS — C773 Secondary and unspecified malignant neoplasm of axilla and upper limb lymph nodes: Secondary | ICD-10-CM | POA: Insufficient documentation

## 2016-07-29 DIAGNOSIS — E119 Type 2 diabetes mellitus without complications: Secondary | ICD-10-CM | POA: Diagnosis not present

## 2016-07-29 DIAGNOSIS — G8918 Other acute postprocedural pain: Secondary | ICD-10-CM | POA: Diagnosis not present

## 2016-07-29 HISTORY — PX: BREAST LUMPECTOMY WITH NEEDLE LOCALIZATION AND AXILLARY LYMPH NODE DISSECTION: SHX5758

## 2016-07-29 LAB — CBC
HEMATOCRIT: 37.8 % (ref 36.0–46.0)
HEMOGLOBIN: 11.9 g/dL — AB (ref 12.0–15.0)
MCH: 28.6 pg (ref 26.0–34.0)
MCHC: 31.5 g/dL (ref 30.0–36.0)
MCV: 90.9 fL (ref 78.0–100.0)
Platelets: 244 10*3/uL (ref 150–400)
RBC: 4.16 MIL/uL (ref 3.87–5.11)
RDW: 13.8 % (ref 11.5–15.5)
WBC: 11.9 10*3/uL — ABNORMAL HIGH (ref 4.0–10.5)

## 2016-07-29 LAB — CREATININE, SERUM: Creatinine, Ser: 0.67 mg/dL (ref 0.44–1.00)

## 2016-07-29 SURGERY — BREAST LUMPECTOMY WITH NEEDLE LOCALIZATION AND AXILLARY LYMPH NODE DISSECTION
Anesthesia: Regional | Site: Breast | Laterality: Right

## 2016-07-29 MED ORDER — OXYBUTYNIN CHLORIDE ER 10 MG PO TB24
10.0000 mg | ORAL_TABLET | Freq: Every day | ORAL | Status: DC
  Administered 2016-07-29: 10 mg via ORAL
  Filled 2016-07-29: qty 1

## 2016-07-29 MED ORDER — METHOCARBAMOL 500 MG PO TABS: 500.0000 mg | ORAL_TABLET | Freq: Every evening | ORAL | Status: DC | PRN

## 2016-07-29 MED ORDER — DEXAMETHASONE SODIUM PHOSPHATE 10 MG/ML IJ SOLN
INTRAMUSCULAR | Status: AC
  Filled 2016-07-29: qty 1

## 2016-07-29 MED ORDER — IRBESARTAN 300 MG PO TABS
300.0000 mg | ORAL_TABLET | Freq: Every day | ORAL | Status: DC
  Administered 2016-07-30: 300 mg via ORAL
  Filled 2016-07-29: qty 1

## 2016-07-29 MED ORDER — PRAVASTATIN SODIUM 40 MG PO TABS: 40.0000 mg | ORAL_TABLET | Freq: Every day | ORAL | Status: DC

## 2016-07-29 MED ORDER — CALCIUM CARBONATE-VITAMIN D 500-200 MG-UNIT PO TABS
1.0000 | ORAL_TABLET | Freq: Every day | ORAL | Status: DC
  Administered 2016-07-30: 1 via ORAL
  Filled 2016-07-29 (×2): qty 1

## 2016-07-29 MED ORDER — ALBUTEROL SULFATE (2.5 MG/3ML) 0.083% IN NEBU: 2.5000 mg | INHALATION_SOLUTION | Freq: Four times a day (QID) | RESPIRATORY_TRACT | Status: DC | PRN

## 2016-07-29 MED ORDER — PROPOFOL 10 MG/ML IV BOLUS
INTRAVENOUS | Status: DC | PRN
  Administered 2016-07-29: 30 mg via INTRAVENOUS
  Administered 2016-07-29: 150 mg via INTRAVENOUS

## 2016-07-29 MED ORDER — IPRATROPIUM-ALBUTEROL 0.5-2.5 (3) MG/3ML IN SOLN: 3.0000 mL | RESPIRATORY_TRACT | Status: DC | PRN

## 2016-07-29 MED ORDER — METHYLENE BLUE 0.5 % INJ SOLN
INTRAVENOUS | Status: AC
  Filled 2016-07-29: qty 10

## 2016-07-29 MED ORDER — NEOSTIGMINE METHYLSULFATE 10 MG/10ML IV SOLN
INTRAVENOUS | Status: DC | PRN
  Administered 2016-07-29: 4 mg via INTRAVENOUS

## 2016-07-29 MED ORDER — AMLODIPINE BESYLATE 5 MG PO TABS
5.0000 mg | ORAL_TABLET | Freq: Every day | ORAL | Status: DC
  Administered 2016-07-30: 5 mg via ORAL
  Filled 2016-07-29: qty 1

## 2016-07-29 MED ORDER — SUCCINYLCHOLINE CHLORIDE 20 MG/ML IJ SOLN
INTRAMUSCULAR | Status: DC | PRN
  Administered 2016-07-29: 140 mg via INTRAVENOUS

## 2016-07-29 MED ORDER — VITAMIN D 1000 UNITS PO TABS
1000.0000 [IU] | ORAL_TABLET | ORAL | Status: DC
  Administered 2016-07-30: 1000 [IU] via ORAL
  Filled 2016-07-29: qty 1

## 2016-07-29 MED ORDER — MIDAZOLAM HCL 2 MG/2ML IJ SOLN
2.0000 mg | Freq: Once | INTRAMUSCULAR | Status: AC
  Administered 2016-07-29: 2 mg via INTRAVENOUS

## 2016-07-29 MED ORDER — GLUCOSAMINE-CHONDROITIN 500-400 MG PO TABS: 1.0000 | ORAL_TABLET | Freq: Two times a day (BID) | ORAL | Status: DC

## 2016-07-29 MED ORDER — LORATADINE 10 MG PO TABS
10.0000 mg | ORAL_TABLET | Freq: Every day | ORAL | Status: DC
  Administered 2016-07-30: 10 mg via ORAL
  Filled 2016-07-29: qty 1

## 2016-07-29 MED ORDER — PROPOFOL 10 MG/ML IV BOLUS
INTRAVENOUS | Status: AC
  Filled 2016-07-29: qty 20

## 2016-07-29 MED ORDER — EPHEDRINE SULFATE 50 MG/ML IJ SOLN
INTRAMUSCULAR | Status: DC | PRN
  Administered 2016-07-29 (×2): 10 mg via INTRAVENOUS

## 2016-07-29 MED ORDER — ROCURONIUM BROMIDE 10 MG/ML (PF) SYRINGE
PREFILLED_SYRINGE | INTRAVENOUS | Status: AC
  Filled 2016-07-29: qty 10

## 2016-07-29 MED ORDER — FENTANYL CITRATE (PF) 100 MCG/2ML IJ SOLN
INTRAMUSCULAR | Status: AC
  Filled 2016-07-29: qty 2

## 2016-07-29 MED ORDER — LACTATED RINGERS IV SOLN: INTRAVENOUS | Status: DC

## 2016-07-29 MED ORDER — ONDANSETRON HCL 4 MG/2ML IJ SOLN
INTRAMUSCULAR | Status: AC
  Filled 2016-07-29: qty 2

## 2016-07-29 MED ORDER — ENOXAPARIN SODIUM 40 MG/0.4ML ~~LOC~~ SOLN
40.0000 mg | SUBCUTANEOUS | Status: DC
  Administered 2016-07-30: 40 mg via SUBCUTANEOUS
  Filled 2016-07-29: qty 0.4

## 2016-07-29 MED ORDER — BUPIVACAINE-EPINEPHRINE (PF) 0.5% -1:200000 IJ SOLN
INTRAMUSCULAR | Status: DC | PRN
  Administered 2016-07-29: 30 mL

## 2016-07-29 MED ORDER — OXYCODONE HCL 5 MG PO TABS
5.0000 mg | ORAL_TABLET | ORAL | Status: DC | PRN
  Administered 2016-07-29 – 2016-07-30 (×4): 5 mg via ORAL
  Filled 2016-07-29 (×4): qty 1

## 2016-07-29 MED ORDER — MIDAZOLAM HCL 2 MG/2ML IJ SOLN
INTRAMUSCULAR | Status: AC
  Filled 2016-07-29: qty 2

## 2016-07-29 MED ORDER — LACTATED RINGERS IV SOLN
INTRAVENOUS | Status: DC
  Administered 2016-07-29 (×3): via INTRAVENOUS

## 2016-07-29 MED ORDER — SUGAMMADEX SODIUM 500 MG/5ML IV SOLN
INTRAVENOUS | Status: AC
  Filled 2016-07-29: qty 5

## 2016-07-29 MED ORDER — LIDOCAINE HCL (CARDIAC) 20 MG/ML IV SOLN
INTRAVENOUS | Status: DC | PRN
  Administered 2016-07-29: 80 mg via INTRAVENOUS

## 2016-07-29 MED ORDER — ONDANSETRON HCL 4 MG/2ML IJ SOLN: 4.0000 mg | Freq: Four times a day (QID) | INTRAMUSCULAR | Status: DC | PRN

## 2016-07-29 MED ORDER — ACETAMINOPHEN 500 MG PO TABS
ORAL_TABLET | ORAL | Status: AC
  Filled 2016-07-29: qty 2

## 2016-07-29 MED ORDER — FENTANYL CITRATE (PF) 250 MCG/5ML IJ SOLN
INTRAMUSCULAR | Status: AC
  Filled 2016-07-29: qty 5

## 2016-07-29 MED ORDER — GABAPENTIN 300 MG PO CAPS
300.0000 mg | ORAL_CAPSULE | ORAL | Status: AC
  Administered 2016-07-29: 300 mg via ORAL

## 2016-07-29 MED ORDER — NAPROXEN SODIUM 275 MG PO TABS: 550.0000 mg | ORAL_TABLET | Freq: Two times a day (BID) | ORAL | Status: DC | PRN

## 2016-07-29 MED ORDER — HYDROCHLOROTHIAZIDE 25 MG PO TABS
25.0000 mg | ORAL_TABLET | Freq: Every day | ORAL | Status: DC
  Administered 2016-07-30: 25 mg via ORAL
  Filled 2016-07-29: qty 1

## 2016-07-29 MED ORDER — VITAMIN B-12 1000 MCG PO TABS
1000.0000 ug | ORAL_TABLET | Freq: Every day | ORAL | Status: DC
  Administered 2016-07-30: 1000 ug via ORAL
  Filled 2016-07-29: qty 1

## 2016-07-29 MED ORDER — BUPIVACAINE-EPINEPHRINE 0.25% -1:200000 IJ SOLN
INTRAMUSCULAR | Status: DC | PRN
  Administered 2016-07-29: 10 mL

## 2016-07-29 MED ORDER — SODIUM CHLORIDE 0.9 % IJ SOLN
INTRAMUSCULAR | Status: AC
  Filled 2016-07-29: qty 10

## 2016-07-29 MED ORDER — FENTANYL CITRATE (PF) 100 MCG/2ML IJ SOLN
25.0000 ug | INTRAMUSCULAR | Status: DC | PRN
  Administered 2016-07-29 (×2): 25 ug via INTRAVENOUS

## 2016-07-29 MED ORDER — METHOCARBAMOL 500 MG PO TABS: 500.0000 mg | ORAL_TABLET | Freq: Four times a day (QID) | ORAL | Status: DC | PRN

## 2016-07-29 MED ORDER — FENTANYL CITRATE (PF) 100 MCG/2ML IJ SOLN
INTRAMUSCULAR | Status: DC | PRN
  Administered 2016-07-29: 100 ug via INTRAVENOUS

## 2016-07-29 MED ORDER — ACETAMINOPHEN 500 MG PO TABS
1000.0000 mg | ORAL_TABLET | ORAL | Status: AC
  Administered 2016-07-29: 1000 mg via ORAL

## 2016-07-29 MED ORDER — PHENYLEPHRINE HCL 10 MG/ML IJ SOLN
INTRAMUSCULAR | Status: DC | PRN
  Administered 2016-07-29: 80 ug via INTRAVENOUS
  Administered 2016-07-29: 120 ug via INTRAVENOUS

## 2016-07-29 MED ORDER — ALPRAZOLAM 0.25 MG PO TABS: 0.2500 mg | ORAL_TABLET | Freq: Two times a day (BID) | ORAL | Status: DC | PRN

## 2016-07-29 MED ORDER — CEFAZOLIN SODIUM-DEXTROSE 2-4 GM/100ML-% IV SOLN
2.0000 g | Freq: Three times a day (TID) | INTRAVENOUS | Status: AC
  Administered 2016-07-29: 2 g via INTRAVENOUS
  Filled 2016-07-29: qty 100

## 2016-07-29 MED ORDER — GABAPENTIN 300 MG PO CAPS
ORAL_CAPSULE | ORAL | Status: AC
  Filled 2016-07-29: qty 1

## 2016-07-29 MED ORDER — FENTANYL CITRATE (PF) 100 MCG/2ML IJ SOLN
50.0000 ug | Freq: Once | INTRAMUSCULAR | Status: AC
  Administered 2016-07-29: 50 ug via INTRAVENOUS

## 2016-07-29 MED ORDER — MIDAZOLAM HCL 5 MG/5ML IJ SOLN
INTRAMUSCULAR | Status: DC | PRN
  Administered 2016-07-29: 1 mg via INTRAVENOUS

## 2016-07-29 MED ORDER — PHENYLEPHRINE 40 MCG/ML (10ML) SYRINGE FOR IV PUSH (FOR BLOOD PRESSURE SUPPORT)
PREFILLED_SYRINGE | INTRAVENOUS | Status: AC
  Filled 2016-07-29: qty 10

## 2016-07-29 MED ORDER — OLMESARTAN-AMLODIPINE-HCTZ 40-5-25 MG PO TABS: 1.0000 | ORAL_TABLET | Freq: Every day | ORAL | Status: DC

## 2016-07-29 MED ORDER — HYDROMORPHONE HCL 1 MG/ML IJ SOLN: 1.0000 mg | INTRAMUSCULAR | Status: DC | PRN

## 2016-07-29 MED ORDER — ASPIRIN EC 81 MG PO TBEC
81.0000 mg | DELAYED_RELEASE_TABLET | Freq: Every day | ORAL | Status: DC
  Administered 2016-07-30: 81 mg via ORAL
  Filled 2016-07-29: qty 1

## 2016-07-29 MED ORDER — EPINEPHRINE 0.3 MG/0.3ML IJ SOAJ: 0.3000 mg | Freq: Once | INTRAMUSCULAR | Status: DC | PRN

## 2016-07-29 MED ORDER — BUPIVACAINE-EPINEPHRINE (PF) 0.25% -1:200000 IJ SOLN
INTRAMUSCULAR | Status: AC
  Filled 2016-07-29: qty 30

## 2016-07-29 MED ORDER — SENNA 8.6 MG PO TABS
1.0000 | ORAL_TABLET | Freq: Two times a day (BID) | ORAL | Status: DC
  Administered 2016-07-29 – 2016-07-30 (×2): 8.6 mg via ORAL
  Filled 2016-07-29 (×2): qty 1

## 2016-07-29 MED ORDER — 0.9 % SODIUM CHLORIDE (POUR BTL) OPTIME
TOPICAL | Status: DC | PRN
  Administered 2016-07-29 (×2): 1000 mL

## 2016-07-29 MED ORDER — ONDANSETRON 4 MG PO TBDP: 4.0000 mg | ORAL_TABLET | Freq: Four times a day (QID) | ORAL | Status: DC | PRN

## 2016-07-29 SURGICAL SUPPLY — 61 items
APPLIER CLIP 9.375 MED OPEN (MISCELLANEOUS) ×3
BINDER BREAST LRG (GAUZE/BANDAGES/DRESSINGS) IMPLANT
BINDER BREAST XLRG (GAUZE/BANDAGES/DRESSINGS) IMPLANT
BINDER BREAST XXLRG (GAUZE/BANDAGES/DRESSINGS) ×3 IMPLANT
BLADE SURG ROTATE 9660 (MISCELLANEOUS) IMPLANT
CANISTER SUCTION 2500CC (MISCELLANEOUS) ×3 IMPLANT
CHLORAPREP W/TINT 26ML (MISCELLANEOUS) ×3 IMPLANT
CLIP APPLIE 9.375 MED OPEN (MISCELLANEOUS) ×1 IMPLANT
CONT SPEC 4OZ CLIKSEAL STRL BL (MISCELLANEOUS) ×3 IMPLANT
COVER SURGICAL LIGHT HANDLE (MISCELLANEOUS) ×3 IMPLANT
DERMABOND ADVANCED (GAUZE/BANDAGES/DRESSINGS) ×2
DERMABOND ADVANCED .7 DNX12 (GAUZE/BANDAGES/DRESSINGS) ×1 IMPLANT
DEVICE DUBIN SPECIMEN MAMMOGRA (MISCELLANEOUS) ×3 IMPLANT
DRAIN CHANNEL 19F RND (DRAIN) ×3 IMPLANT
DRAPE LAPAROSCOPIC ABDOMINAL (DRAPES) ×3 IMPLANT
DRAPE PROXIMA HALF (DRAPES) ×3 IMPLANT
DRAPE UTILITY XL STRL (DRAPES) ×3 IMPLANT
DRSG PAD ABDOMINAL 8X10 ST (GAUZE/BANDAGES/DRESSINGS) ×3 IMPLANT
ELECT CAUTERY BLADE 6.4 (BLADE) ×3 IMPLANT
ELECT REM PT RETURN 9FT ADLT (ELECTROSURGICAL) ×3
ELECTRODE REM PT RTRN 9FT ADLT (ELECTROSURGICAL) ×1 IMPLANT
EVACUATOR SILICONE 100CC (DRAIN) ×3 IMPLANT
GAUZE SPONGE 4X4 12PLY STRL (GAUZE/BANDAGES/DRESSINGS) ×3 IMPLANT
GLOVE BIOGEL PI IND STRL 7.0 (GLOVE) ×2 IMPLANT
GLOVE BIOGEL PI IND STRL 8 (GLOVE) ×1 IMPLANT
GLOVE BIOGEL PI INDICATOR 7.0 (GLOVE) ×4
GLOVE BIOGEL PI INDICATOR 8 (GLOVE) ×2
GLOVE ECLIPSE 7.0 STRL STRAW (GLOVE) ×6 IMPLANT
GLOVE ECLIPSE 7.5 STRL STRAW (GLOVE) ×6 IMPLANT
GLOVE EUDERMIC 7 POWDERFREE (GLOVE) ×3 IMPLANT
GLOVE SURG SS PI 6.5 STRL IVOR (GLOVE) ×3 IMPLANT
GOWN STRL REUS W/ TWL LRG LVL3 (GOWN DISPOSABLE) ×1 IMPLANT
GOWN STRL REUS W/ TWL XL LVL3 (GOWN DISPOSABLE) ×1 IMPLANT
GOWN STRL REUS W/TWL LRG LVL3 (GOWN DISPOSABLE) ×2
GOWN STRL REUS W/TWL XL LVL3 (GOWN DISPOSABLE) ×2
ILLUMINATOR WAVEGUIDE N/F (MISCELLANEOUS) IMPLANT
KIT BASIN OR (CUSTOM PROCEDURE TRAY) ×3 IMPLANT
KIT MARKER MARGIN INK (KITS) ×3 IMPLANT
KIT ROOM TURNOVER OR (KITS) ×3 IMPLANT
LIGACLIP MED TITANIUM (CLIP) ×3 IMPLANT
LIGHT WAVEGUIDE WIDE FLAT (MISCELLANEOUS) IMPLANT
NEEDLE HYPO 25GX1X1/2 BEV (NEEDLE) ×3 IMPLANT
NS IRRIG 1000ML POUR BTL (IV SOLUTION) ×3 IMPLANT
PACK GENERAL/GYN (CUSTOM PROCEDURE TRAY) ×3 IMPLANT
PAD ARMBOARD 7.5X6 YLW CONV (MISCELLANEOUS) ×3 IMPLANT
SPECIMEN JAR MEDIUM (MISCELLANEOUS) IMPLANT
SPONGE LAP 4X18 X RAY DECT (DISPOSABLE) ×6 IMPLANT
SUT ETHILON 3 0 FSL (SUTURE) ×3 IMPLANT
SUT MNCRL AB 4-0 PS2 18 (SUTURE) ×6 IMPLANT
SUT SILK 2 0 SH (SUTURE) ×3 IMPLANT
SUT VIC AB 2-0 CT1 27 (SUTURE) ×4
SUT VIC AB 2-0 CT1 TAPERPNT 27 (SUTURE) ×2 IMPLANT
SUT VIC AB 2-0 SH 18 (SUTURE) ×3 IMPLANT
SUT VIC AB 3-0 SH 18 (SUTURE) ×3 IMPLANT
SUT VICRYL AB 3 0 TIES (SUTURE) IMPLANT
SYR CONTROL 10ML LL (SYRINGE) ×3 IMPLANT
TOWEL OR 17X24 6PK STRL BLUE (TOWEL DISPOSABLE) ×3 IMPLANT
TOWEL OR 17X26 10 PK STRL BLUE (TOWEL DISPOSABLE) ×3 IMPLANT
TUBE CONNECTING 12'X1/4 (SUCTIONS) ×1
TUBE CONNECTING 12X1/4 (SUCTIONS) ×2 IMPLANT
YANKAUER SUCT BULB TIP NO VENT (SUCTIONS) ×3 IMPLANT

## 2016-07-29 NOTE — Anesthesia Preprocedure Evaluation (Addendum)
Anesthesia Evaluation  Patient identified by MRN, date of birth, ID band Patient awake    Reviewed: Allergy & Precautions, H&P , NPO status , Patient's Chart, lab work & pertinent test results, reviewed documented beta blocker date and time   Airway Mallampati: II  TM Distance: >3 FB Neck ROM: full    Dental  (+) Dental Advisory Given, Caps 4 upper front are capped:   Pulmonary shortness of breath, asthma , sleep apnea and Continuous Positive Airway Pressure Ventilation , former smoker,    Pulmonary exam normal breath sounds clear to auscultation       Cardiovascular hypertension, Pt. on medications Normal cardiovascular exam Rhythm:regular Rate:Normal     Neuro/Psych negative neurological ROS  negative psych ROS   GI/Hepatic negative GI ROS, Neg liver ROS,   Endo/Other  diabetes, Well Controlled, Type 2Morbid obesity  Renal/GU negative Renal ROS  negative genitourinary   Musculoskeletal   Abdominal (+) + obese,   Peds  Hematology negative hematology ROS (+)   Anesthesia Other Findings   Reproductive/Obstetrics negative OB ROS                            Anesthesia Physical Anesthesia Plan  ASA: III  Anesthesia Plan: General   Post-op Pain Management:    Induction: Intravenous  Airway Management Planned: LMA  Additional Equipment:   Intra-op Plan:   Post-operative Plan: Extubation in OR  Informed Consent: I have reviewed the patients History and Physical, chart, labs and discussed the procedure including the risks, benefits and alternatives for the proposed anesthesia with the patient or authorized representative who has indicated his/her understanding and acceptance.   Dental Advisory Given  Plan Discussed with: CRNA  Anesthesia Plan Comments:         Anesthesia Quick Evaluation

## 2016-07-29 NOTE — Progress Notes (Signed)
Pt admitted to 6N19 via bed from PACU.  Pt AAO X 4.  Pt on 2L O2 via [place].  Pt has 18G to lt hand with fluids infusing.  Pt has incision to rt breast with skin glue, gauze, ABD, and breast binder.  Pt has SCDs in place.  Report rcvd from [place].  Pt has no complaints at the moment.  Will continue to monitor.

## 2016-07-29 NOTE — Anesthesia Postprocedure Evaluation (Signed)
Anesthesia Post Note  Patient: Tiffany Arnold  Procedure(s) Performed: Procedure(s) (LRB): RIGHT BREAST LUMPECTOMY WITH DOUBLE NEEDLE LOCALIZATION AND COMPLETE RIGHT AXILLARY LYMPH NODE DISSECTION (Right)  Patient location during evaluation: PACU Anesthesia Type: General Level of consciousness: awake and alert Pain management: pain level controlled Vital Signs Assessment: post-procedure vital signs reviewed and stable Respiratory status: spontaneous breathing, nonlabored ventilation, respiratory function stable and patient connected to nasal cannula oxygen Cardiovascular status: blood pressure returned to baseline and stable Postop Assessment: no signs of nausea or vomiting Anesthetic complications: no    Last Vitals:  Vitals:   07/29/16 1136 07/29/16 1355  BP: (!) 129/48 (!) 130/45  Pulse: 73 79  Resp: 17 13  Temp:  36.5 C    Last Pain:  Vitals:   07/29/16 1438  TempSrc:   PainSc: 4                  Barbie Croston L

## 2016-07-29 NOTE — Anesthesia Procedure Notes (Signed)
Procedure Name: Intubation Performed by: Clearnce Sorrel Pre-anesthesia Checklist: Patient identified, Emergency Drugs available, Suction available and Patient being monitored Patient Re-evaluated:Patient Re-evaluated prior to inductionOxygen Delivery Method: Circle system utilized Preoxygenation: Pre-oxygenation with 100% oxygen Intubation Type: IV induction Ventilation: Oral airway inserted - appropriate to patient size Laryngoscope Size: Miller and 3 Grade View: Grade I Tube type: Oral Tube size: 7.0 mm Number of attempts: 1 Airway Equipment and Method: Stylet Placement Confirmation: ETT inserted through vocal cords under direct vision,  positive ETCO2 and breath sounds checked- equal and bilateral Secured at: 22 cm Tube secured with: Tape Dental Injury: Teeth and Oropharynx as per pre-operative assessment

## 2016-07-29 NOTE — Anesthesia Procedure Notes (Signed)
Anesthesia Regional Block:  Pectoralis block  Pre-Anesthetic Checklist: ,, timeout performed, Correct Patient, Correct Site, Correct Laterality, Correct Procedure, Correct Position, site marked, Risks and benefits discussed,  Surgical consent,  Pre-op evaluation,  At surgeon's request and post-op pain management  Laterality: Right  Prep: chloraprep       Needles:  Injection technique: Single-shot  Needle Type: Echogenic Needle     Needle Length: 9cm 9 cm Needle Gauge: 21 and 21 G    Additional Needles:  Procedures: ultrasound guided (picture in chart) Pectoralis block Narrative:  Start time: 07/29/2016 11:10 AM End time: 07/29/2016 11:20 AM Injection made incrementally with aspirations every 5 mL.  Performed by: Personally  Anesthesiologist: Rod Mae

## 2016-07-29 NOTE — Transfer of Care (Signed)
Immediate Anesthesia Transfer of Care Note  Patient: Tiffany Arnold  Procedure(s) Performed: Procedure(s): RIGHT BREAST LUMPECTOMY WITH DOUBLE NEEDLE LOCALIZATION AND COMPLETE RIGHT AXILLARY LYMPH NODE DISSECTION (Right)  Patient Location: PACU  Anesthesia Type:General  Level of Consciousness: awake, alert  and oriented  Airway & Oxygen Therapy: Patient Spontanous Breathing and Patient connected to nasal cannula oxygen  Post-op Assessment: Report given to RN and Post -op Vital signs reviewed and stable  Post vital signs: Reviewed and stable  Last Vitals:  Vitals:   07/29/16 1131 07/29/16 1136  BP: (!) 122/48 (!) 129/48  Pulse: 70 73  Resp: 20 17  Temp:      Last Pain:  Vitals:   07/29/16 1040  TempSrc: Oral      Patients Stated Pain Goal: 4 ([id number])  Complications: No apparent anesthesia complications

## 2016-07-29 NOTE — Progress Notes (Signed)
PHARMACIST - PHYSICIAN ORDER COMMUNICATION  CONCERNING: P&T Medication Policy on Herbal Medications  DESCRIPTION:  This patient's order for:  Glucosamine/Chondroitin  has been noted.  This product(s) is classified as an "herbal" or natural product. Due to a lack of definitive safety studies or FDA approval, nonstandard manufacturing practices, plus the potential risk of unknown drug-drug interactions while on inpatient medications, the Pharmacy and Therapeutics Committee does not permit the use of "herbal" or natural products of this type within [facility].   ACTION TAKEN: The pharmacy department is unable to verify this order at this time and your patient has been informed of this safety policy. Please reevaluate patient's clinical condition at discharge and address if the herbal or natural product(s) should be resumed at that time.  Reatha Harps, Pharm.D., BCPS Clinical Pharmacist

## 2016-07-29 NOTE — Op Note (Signed)
Patient Name:           Tiffany Arnold   Date of Surgery:        07/29/2016  Pre op Diagnosis:    Invasive ductal carcinoma right breast, lower inner quadrant                                     Right axillary lymph node metastasis    Post op Diagnosis:    Same  Procedure:                 Right breast lumpectomy with double wire needle localization; complete right axillary lymph node dissection  Surgeon:                     Edsel Petrin. Dalbert Batman, M.D., FACS  Assistant:                      OR staff  Operative Indications:   This is a very pleasant 75 year old Caucasian female. 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 Her PCP is Dr. Bjorn Loser. I discussed her case with Dr. Enriqueta Shutter and Dr. Radford Pax at the breast center of [place].      No major breast problems in the past. Gets annual mammograms. On June 13 she had a biopsy in the lower inner right breast. The clip did not deploy and the biopsy showed atypical ductal hyperplasia. 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 There was an abnormal lymph node which was biopsied and that is also positive for metastatic cancer. She has seen Dr. Burr Medico who hopes that we can do lumpectomy and axillary lymph node dissection. Mammogram will be sent. Neither of [place] think she would respond to neoadjuvant chemotherapy. I discussed her imaging findings with Dr. Curlene Dolphin. It looks like on MRI there is rim enhancement in 3 locations in the right breast, relatively close to each other in the lower inner right breast spanning a 4.5 cm area medial to lateral. We think we can get this out with a double wire bracketed localization. She will need a complete axillary lymph node dissection.      Comorbidities include obesity, borderline diabetes, hypertension,  GERD, anxiety on Xanax, allergic rhinitis, history of lumbar back surgery and bilateral knee replacements and umbilical hernia repair.     Family history is negative for breast cancer syndromes. She lives in [place] once her radiation therapy there     We had a one-hour talk about how to manage her. We talked about mastectomy and axillary lymph node dissection, with or without reconstruction. We talked about double wire bracketed lumpectomy with axillary lymph node dissection with or without plastic surgical revision. She thinks she wants to go with the lumpectomy with the 2 wires and axillary lymph node dissection. She declines referral to plastic surgery.      I discussed the indications, details, techniques, numerous risk of the surgery with her and her sister and her daughter. She is aware the risks of bleeding, infection, cosmetic deformity which may be minor or major. This may require revision. She is aware of the possible need for a second operation if we have positive margins. She noted she is going to get a complete  axillary lymph node dissection with significant risk of arm swelling and sensory deficit. She no she will be offered radiation therapy and antiestrogen therapy. She knows that she may or may not need chemotherapy. She is comfortable with all of this. She is ready to go ahead and schedule surgery.   Operative Findings:       The 2 wires were placed in the right breast in the lower inner quadrant from medial to lateral and they were relatively close to each other.  These were removed with a radial a relapse.  The specimen mammogram looked good showing both wires in both biopsy clips within this specimen and there appeared to be a good margin around them.  There were some slightly enlarged palpable lymph nodes in the right axilla.  A pectoral block was performed by the anesthesiologist in the holding area  Procedure in Detail:        Following the induction of general  endotracheal anesthesia the patient's neck right breast chest wall and axilla were prepped and draped in a sterile fashion.  Surgical timeout was performed.  Intravenous antibiotics were given.  0.5% Marcaine with epinephrine was infiltrated into the subcutaneous tissue.     I first made a radial elliptical incision in the right breast lower inner quadrant encompassing the 2 wires conservatively.  Dissection was carried deeply into the breast tissue and widely around the wires.  The specimen was removed and marked with silk sutures and a 6 color ink kit  To orient the pathologist.  The specimen mammogram looked good as described above.  Hemostasis was excellent.  The wound was irrigated with saline.  5 metal clips were placed in the cardinal positions of the cavity to orient the radiation oncologist.  The breast tissues were closed in several layers with 2-0 Vicryl and 3-0 Vicryl sutures and skin closed with a running subcuticular 4-0 Monocryl and Dermabond.       I then made a transverse incision at the hairline of the right axilla.  Dissection was carried down through the subcutaneous tissue.  The clavipectoral fascia was incised.  The lateral borders of the pectoralis major and pectoralis minor muscles were defined, dissected and retracted medially.  I took the dissection up to the axillary vein.  A venous tributary of the axillary  vein superficially was controlled with metal clips and divided.  I dissected all the level I and level II contents out.  The thoracodorsal neurovascular bundle was identified and preserved.  A few bleeders were controlled with metal clips and some with 3-0 Vicryl suture ligatures.  This was a fairly bulky axillary specimen due to the patient's body habitus.  Was a specimen was removed the wound was copiously irrigated with saline.  All bleeders were controlled.  Hastings drain was placed up into the wound and brought out through a separate stab incisions at the lateral  inframammary fold.  Sutured to the skin with nylon suture and connected to suction bulb.  The deep subcutaneous tissues were closed with 2-0 Vicryl sutures and skin closed with a running subcuticular 4-0 Monocryl and Dermabond.  Clean bandages and a breast binder were placed.  The patient tolerated the procedure well was taken to PACU in stable condition.  EBL 40-50 mL.  Counts correct.  Complications none.     Edsel Petrin. Dalbert Batman, M.D., FACS General and Minimally Invasive Surgery Breast and Colorectal Surgery  07/29/2016 1:45 PM

## 2016-07-29 NOTE — Interval H&P Note (Signed)
History and Physical Interval Note:  07/29/2016 10:32 AM  Tiffany Arnold  has presented today for surgery, with the diagnosis of RIGHT BREAST CANCER  The various methods of treatment have been discussed with the patient and family. After consideration of risks, benefits and other options for treatment, the patient has consented to  Procedure(s): RIGHT BREAST LUMPECTOMY WITH DOUBLE NEEDLE LOCALIZATION AND COMPLETE RIGHT AXILLARY LYMPH NODE DISSECTION (Right) as a surgical intervention .  The patient's history has been reviewed, patient examined, no change in status, stable for surgery.  I have reviewed the patient's chart and labs.  Questions were answered to the patient's satisfaction.     Adin Hector

## 2016-07-30 ENCOUNTER — Encounter (HOSPITAL_COMMUNITY): Payer: Self-pay | Admitting: General Surgery

## 2016-07-30 DIAGNOSIS — C773 Secondary and unspecified malignant neoplasm of axilla and upper limb lymph nodes: Secondary | ICD-10-CM | POA: Diagnosis not present

## 2016-07-30 DIAGNOSIS — J45909 Unspecified asthma, uncomplicated: Secondary | ICD-10-CM | POA: Diagnosis not present

## 2016-07-30 DIAGNOSIS — K219 Gastro-esophageal reflux disease without esophagitis: Secondary | ICD-10-CM | POA: Diagnosis not present

## 2016-07-30 DIAGNOSIS — D0511 Intraductal carcinoma in situ of right breast: Secondary | ICD-10-CM | POA: Diagnosis not present

## 2016-07-30 DIAGNOSIS — I10 Essential (primary) hypertension: Secondary | ICD-10-CM | POA: Diagnosis not present

## 2016-07-30 DIAGNOSIS — F419 Anxiety disorder, unspecified: Secondary | ICD-10-CM | POA: Diagnosis not present

## 2016-07-30 DIAGNOSIS — E119 Type 2 diabetes mellitus without complications: Secondary | ICD-10-CM | POA: Diagnosis not present

## 2016-07-30 DIAGNOSIS — Z170 Estrogen receptor positive status [ER+]: Secondary | ICD-10-CM | POA: Diagnosis not present

## 2016-07-30 DIAGNOSIS — C50311 Malignant neoplasm of lower-inner quadrant of right female breast: Secondary | ICD-10-CM | POA: Diagnosis not present

## 2016-07-30 MED ORDER — OXYCODONE HCL 5 MG PO TABS: 5.0000 mg | ORAL_TABLET | ORAL | 0 refills | Status: DC | PRN

## 2016-07-30 NOTE — Discharge Instructions (Signed)
[facility] °Office Phone Number [phone] ° °BREAST BIOPSY/ PARTIAL MASTECTOMY: POST OP INSTRUCTIONS ° °Always review your discharge instruction sheet given to you by the facility where your surgery was performed. ° °IF YOU HAVE DISABILITY OR FAMILY LEAVE FORMS, YOU MUST BRING THEM TO THE OFFICE FOR PROCESSING.  DO NOT GIVE THEM TO YOUR DOCTOR. ° °1. A prescription for pain medication may be given to you upon discharge.  Take your pain medication as prescribed, if needed.  If narcotic pain medicine is not needed, then you may take acetaminophen (Tylenol) or ibuprofen (Advil) as needed. °2. Take your usually prescribed medications unless otherwise directed °3. If you need a refill on your pain medication, please contact your pharmacy.  They will contact our office to request authorization.  Prescriptions will not be filled after 5pm or on week-ends. °4. You should eat very light the first 24 hours after surgery, such as soup, crackers, pudding, etc.  Resume your normal diet the day after surgery. °5. Most patients will experience some swelling and bruising in the breast.  Ice packs and a good support bra will help.  Swelling and bruising can take several days to resolve.  °6. It is common to experience some constipation if taking pain medication after surgery.  Increasing fluid intake and taking a stool softener will usually help or prevent this problem from occurring.  A mild laxative (Milk of Magnesia or Miralax) should be taken according to package directions if there are no bowel movements after 48 hours. °7. Unless discharge instructions indicate otherwise, you may remove your bandages 24-48 hours after surgery, and you may shower at that time.  You may have steri-strips (small skin tapes) in place directly over the incision.  These strips should be left on the skin for 7-10 days.  If your surgeon used skin glue on the incision, you may shower in 24 hours.  The glue will flake off over the  next 2-3 weeks.  Any sutures or staples will be removed at the office during your follow-up visit. °8. ACTIVITIES:  You may resume regular daily activities (gradually increasing) beginning the next day.  Wearing a good support bra or sports bra minimizes pain and swelling.  You may have sexual intercourse when it is comfortable. °a. You may drive when you no longer are taking prescription pain medication, you can comfortably wear a seatbelt, and you can safely maneuver your car and apply brakes. °b. RETURN TO WORK:  ______________________________________________________________________________________ °9. You should see your doctor in the office for a follow-up appointment approximately two weeks after your surgery.  Your doctor’s nurse will typically make your follow-up appointment when she calls you with your pathology report.  Expect your pathology report 2-3 business days after your surgery.  You may call to check if you do not hear from us after three days. °10. OTHER INSTRUCTIONS: _______________________________________________________________________________________________ _____________________________________________________________________________________________________________________________________ °_____________________________________________________________________________________________________________________________________ °_____________________________________________________________________________________________________________________________________ ° °WHEN TO CALL YOUR DOCTOR: °1. Fever over 101.0 °2. Nausea and/or vomiting. °3. Extreme swelling or bruising. °4. Continued bleeding from incision. °5. Increased pain, redness, or drainage from the incision. ° °The clinic staff is available to answer your questions during regular business hours.  Please don’t hesitate to call and ask to speak to one of the nurses for clinical concerns.  If you have a medical emergency, go to the nearest  emergency room or call 911.  A surgeon from [facility] is always on call at the hospital. ° °For further questions, please visit [email or web address]  °

## 2016-07-30 NOTE — Progress Notes (Signed)
1 Day Post-Op  Subjective: Doing well Pain control improved  Objective: Vital signs in last 24 hours: Temp:  [97.7 F (36.5 C)-98.1 F (36.7 C)] 97.9 F (36.6 C) (08/09 0526) Pulse Rate:  [64-80] 74 (08/09 0526) Resp:  [13-27] 18 (08/09 0526) BP: (111-149)/(45-74) 142/54 (08/09 0526) SpO2:  [90 %-100 %] 95 % (08/09 0526) Weight:  [110.2 kg (243 lb)] 110.2 kg (243 lb) (08/08 1040)    Intake/Output from previous day: 08/08 0701 - 08/09 0700 In: 2531 [I.V.:2531] Out: 845 [Urine:700; Drains:45; Blood:100] Intake/Output this shift: No intake/output data recorded.  Binder in place No hematoma Drain serosang  Lab Results:   Recent Labs  07/29/16 2029  WBC 11.9*  HGB 11.9*  HCT 37.8  PLT 244   BMET  Recent Labs  07/29/16 2029  CREATININE 0.67   PT/INR No results for input(s): LABPROT, INR in the last 72 hours. ABG No results for input(s): PHART, HCO3 in the last 72 hours.  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 EXAM: SPECIMEN RADIOGRAPH OF THE RIGHT BREAST COMPARISON:  Previous exam(s). FINDINGS: Status post excision of the right breast. The two wire tips and both coil shaped and X shaped biopsy marker clips are present and are marked for pathology. IMPRESSION: Specimen radiograph of the right breast. Electronically Signed   By: Edwin Cap M.D.   On: 07/29/2016 12:35   Mm Rt Plc Breast Loc Dev   1st Lesion  Inc Mammo Guide  Result Date: 07/29/2016 CLINICAL DATA:  Patient with biopsy-proven invasive cancer and biopsy-proven ADH within the right breast. Patient is scheduled for breast conservation surgery today requiring preoperative needle localizations. EXAM: NEEDLE LOCALIZATION OF THE RIGHT BREAST WITH MAMMO GUIDANCE COMPARISON:  Previous exams. FINDINGS: Patient presents for needle localization prior to  lumpectomies. I met with the patient and we discussed the procedure of needle localization including benefits and alternatives. We discussed the high likelihood of a successful procedure. We discussed the risks of the procedure, including infection, bleeding, tissue injury, and further surgery. Informed, written consent was given. The usual time-out protocol was performed immediately prior to the procedure. Using mammographic guidance, sterile technique, 1% lidocaine and a 9 cm modified Kopans needle, the coil shaped clip within the lower inner quadrant of the right breast was localized using medial approach. Next, using mammographic guidance, sterile technique, 1% lidocaine and a 7 cm modified Kopan's needle, the X shaped clip within the lower inner quadrant of the right breast was localized using a medial approach. The images were marked for Dr. Derrell Lolling. IMPRESSION: Needle localization right breast x2. No apparent complications. Electronically Signed   By: Bary Richard M.D.   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 Patient is scheduled for breast conservation surgery today requiring preoperative needle localizations. EXAM: NEEDLE LOCALIZATION OF THE RIGHT BREAST WITH MAMMO GUIDANCE COMPARISON:  Previous exams. FINDINGS: Patient presents for needle localization prior to lumpectomies. I met with the patient and we discussed the procedure of needle localization including benefits and alternatives. We discussed the high likelihood of a successful procedure. We discussed the risks of the procedure, including infection, bleeding, tissue injury, and further surgery. Informed, written consent was given. The usual  time-out protocol was performed immediately prior to the procedure. Using mammographic guidance, sterile technique, 1% lidocaine and a 9 cm  modified Kopans needle, the coil shaped clip within the lower inner quadrant of the right breast was localized using medial approach. Next, using mammographic guidance, sterile technique, 1% lidocaine and a 7 cm modified Kopan's needle, the X shaped clip within the lower inner quadrant of the right breast was localized using a medial approach. The images were marked for Dr. Dalbert Batman. IMPRESSION: Needle localization right breast x2. No apparent complications. Electronically Signed   By: Franki Cabot M.D.   On: 07/29/2016 10:16    Anti-infectives: Anti-infectives    Start     Dose/Rate Route Frequency Ordered Stop   07/29/16 2200  ceFAZolin (ANCEF) IVPB 2g/100 mL premix     2 g 200 mL/hr over 30 Minutes Intravenous Every 8 hours 07/29/16 1949 07/29/16 2305   07/29/16 1100  ceFAZolin (ANCEF) 3 g in dextrose 5 % 50 mL IVPB     3 g 130 mL/hr over 30 Minutes Intravenous On call to O.R. 07/28/16 1404 07/29/16 1211      Assessment/Plan: s/p Procedure(s): RIGHT BREAST LUMPECTOMY WITH DOUBLE NEEDLE LOCALIZATION AND COMPLETE RIGHT AXILLARY LYMPH NODE DISSECTION (Right)  Doing well post op Discharge home  LOS: 0 days    Odin Mariani A 07/30/2016

## 2016-07-30 NOTE — Progress Notes (Signed)
Discharge instructions gone over with patient and daughter. Home medications discussed. Follow up appointment to be made. Taught daughter how to empty drain and do drain care. She return demonstrated. Prescription given. Arm precautions discussed. Diet, activity, and reasons to call the doctor gone over. Bowel regimen discussed. Patient verbalized understanding of instructions.

## 2016-07-31 ENCOUNTER — Telehealth: Payer: Self-pay

## 2016-07-31 NOTE — Telephone Encounter (Signed)
Tiffany Arnold left v/m(do not see DPR); pt recently had breast surgery and since pt has had surgery unsteady on her feet. Request how to get walker for pt. [name] request cb.

## 2016-07-31 NOTE — Telephone Encounter (Signed)
In order for insurance to pay, she will have to make an OV to discuss unsteadiness. If she wants to pay out of pocket, can just go buy one at drug store or medical supply store.

## 2016-08-04 NOTE — Telephone Encounter (Signed)
Pt is aware she needs OV--she will call back if she needs it

## 2016-08-05 ENCOUNTER — Institutional Professional Consult (permissible substitution): Payer: Medicare PPO | Admitting: Internal Medicine

## 2016-08-05 DIAGNOSIS — G4733 Obstructive sleep apnea (adult) (pediatric): Secondary | ICD-10-CM | POA: Diagnosis not present

## 2016-08-06 NOTE — Progress Notes (Signed)
[facility] Consultation      Assessment and Plan:  Obstructive sleep apnea. --Known history of OSA her machine broke, and it has been more than 5 years since her last study.  --Will send for new sleep study.   Obesity.  --Discussed the importance of weight loss which may help with her OSA.   Asthma.  --Mild intermitttent, continue prn inhaler. She is asked to inform us if she does not have a rescue inhaler handy and we will prescribed her one.    Date: 08/06/2016  MRN# [id number] Tiffany Arnold Apr 03, 1941  Referring Physician: Dr. Garnette Gunner.   Tiffany Arnold is a 75 y.o. old female seen in consultation for chief complaint of:    Chief Complaint  Patient presents with  . sleep consult    per [name]. piror sleep study around 2007. currently wear cpap avg 8hr nightly, machine is about 5y old and is broken. pt is currently using her old machine and feels pressure is too strong.  [id number]:3    HPI:   The patient was recently discharged from right breast lumpectomy and axillary lymph node dissection. She is here today because she needs a new CPAP machine. Her machine stopped working, it was an auto-PAP. She was originally diagnosed with OSA about 20 years ago at [facility] and was put on CPAP. She then followed her doc into private practice and was put on a new machine which was apparently an Auto-pap at some point in time.   She has a diagnosis of allergy induced asthma, she has not reuired her inahler in the past 6 months.   Typically goes to bed between 11 and midnight. Usually takes about 5 minutes to fall asleep, usually gets out of bed at 5 AM.  PMHX:   Past Medical History:  Diagnosis Date  . Allergy   . Arthritis   . Asthma   . Cataract of both eyes   . Chicken pox   . Colon polyps   . Dog bite of index finger 07/23/2016   Left index finger  . GERD (gastroesophageal reflux disease)   . Hyperlipidemia   . Hypertension   . IBS  (irritable bowel syndrome)   . Phlebitis   . Pneumonia    hx of  . Shortness of breath dyspnea    with exertion  . Sleep apnea    wears CPAP machine nightly   Surgical Hx:  Past Surgical History:  Procedure Laterality Date  . BACK SURGERY    . BREAST CYST EXCISION Right 2001   negative  . BREAST LUMPECTOMY WITH NEEDLE LOCALIZATION AND AXILLARY LYMPH NODE DISSECTION Right 07/29/2016   Procedure: RIGHT BREAST LUMPECTOMY WITH DOUBLE NEEDLE LOCALIZATION AND COMPLETE RIGHT AXILLARY LYMPH NODE DISSECTION;  Surgeon: Fanny Skates, MD;  Location: [facility];  Service: General;  Laterality: Right;  . BREAST SURGERY Left 2000   Biopsy  . BUNIONECTOMY Bilateral 1998   great toe fusion on right foot  . COLONOSCOPY W/ POLYPECTOMY    . HERNIA REPAIR  [id number]   [place] S[facility] SURGERY  2015  . REPLACEMENT TOTAL KNEE Bilateral 2007  . TOTAL SHOULDER REPLACEMENT Left 2007   Family Hx:  Family History  Problem Relation Age of Onset  . Arthritis Mother   . Stroke Mother   . Hypertension Mother   . Cancer Father     Prostate  . Stroke Father   . Hypertension Father   . Hypertension Maternal  Grandmother   . Rheum arthritis Maternal Grandfather   . Stroke Maternal Grandfather   . Hypertension Maternal Grandfather   . Cancer Paternal Grandmother     Colon  . Hypertension Paternal Grandmother   . Hypertension Paternal Grandfather   . Breast cancer Neg Hx    Social Hx:   Social History  Substance Use Topics  . Smoking status: Former Research scientist (life sciences)  . Smokeless tobacco: Never Used     Comment: quit 24 years ago  . Alcohol use Yes     Comment: rare   Medication:   Reviewed.     Allergies:  Other  Review of Systems: Gen:  Denies  fever, sweats, chills HEENT: Denies blurred vision, double vision.  Cvc:  No dizziness, chest pain. Resp:   Denies cough or sputum production,  Gi: Denies swallowing difficulty, stomach pain. Gu:  Denies bladder incontinence, burning urine Ext:   No Joint  pain, stiffness. Skin: No skin rash,  hives  Endoc:  No polyuria, polydipsia. Psych: No depression, insomnia. Other:  All other systems were reviewed with the patient and were negative other that what is mentioned in the HPI.   Physical Examination:   VS: BP 132/62 (BP Location: Left Wrist, Cuff Size: Normal)   Pulse 74   SpO2 95%   General Appearance: No distress  Neuro:without focal findings,  speech normal,  HEENT: PERRLA, EOM intact.  [place] 3.  Pulmonary: normal breath sounds, No wheezing.  CardiovascularNormal S1,S2.  No m/r/g.   Abdomen: Benign, Soft, non-tender. Renal:  No costovertebral tenderness  GU:  No performed at this time. Endoc: No evident thyromegaly, no signs of acromegaly. Skin:   warm, no rashes, no ecchymosis  Extremities: normal, no cyanosis, clubbing.  Other findings:    LABORATORY PANEL:   CBC No results for input(s): WBC, HGB, HCT, PLT in the last 168 hours. ------------------------------------------------------------------------------------------------------------------  Chemistries  No results for input(s): NA, K, CL, CO2, GLUCOSE, BUN, CREATININE, CALCIUM, MG, AST, ALT, ALKPHOS, BILITOT in the last 168 hours.  Invalid input(s): GFRCGP ------------------------------------------------------------------------------------------------------------------  Cardiac Enzymes No results for input(s): TROPONINI in the last 168 hours. ------------------------------------------------------------  RADIOLOGY:  No results found.     Thank  you for the consultation and for allowing [facility] to assist in the care of your patient. Our recommendations are noted above.  Please contact us if we can be of further service.   Marda Stalker, MD.  Board Certified in Internal Medicine, Pulmonary Medicine, [place], and Sleep Medicine.  [facility] Office Number: [phone]  Patricia Pesa,  M.D.  Vilinda Boehringer, M.D.  Merton Border, M.D  08/06/2016

## 2016-08-07 ENCOUNTER — Ambulatory Visit (INDEPENDENT_AMBULATORY_CARE_PROVIDER_SITE_OTHER): Payer: Medicare PPO | Admitting: Internal Medicine

## 2016-08-07 ENCOUNTER — Encounter: Payer: Self-pay | Admitting: Internal Medicine

## 2016-08-07 VITALS — BP 132/62 | HR 74 | Ht 61.0 in | Wt 250.0 lb

## 2016-08-07 DIAGNOSIS — Z9989 Dependence on other enabling machines and devices: Secondary | ICD-10-CM

## 2016-08-07 DIAGNOSIS — G4733 Obstructive sleep apnea (adult) (pediatric): Secondary | ICD-10-CM | POA: Diagnosis not present

## 2016-08-07 NOTE — Patient Instructions (Signed)
--  Will order sleep study.   --Try to find your inhaler, if you can not find it, we will order you a new one.

## 2016-08-12 ENCOUNTER — Telehealth: Payer: Self-pay | Admitting: *Deleted

## 2016-08-12 NOTE — Telephone Encounter (Signed)
Ordered mammaprint per Dr. Burr Medico.  Faxed requisition to pathology and confirmed receipt.

## 2016-08-13 NOTE — Progress Notes (Deleted)
[place]  [phone] Fax:(336) [place] Note   Patient Care Team: [name], NP as PCP - General (Internal Medicine) 08/13/2016  Referring physician: Dr. Dalbert Batman  CHIEF COMPLAINTS/PURPOSE OF CONSULTATION:  Newly diagnosed right breast cancer  . Oncology History   Breast cancer of lower-inner quadrant of right female breast [facility])   Staging form: Breast, AJCC 7th Edition     Clinical stage from 06/09/2016: Stage IIB (T2, N1, M0) - Signed by Truitt Merle, MD on 06/27/2016       Breast cancer of lower-inner quadrant of right female breast ([place])   05/23/2016 Mammogram    Diagnostic mammogram and ultrasound showed suspicious architectural distortion within the right breast lower inner quadrant, measuring 1.3 cm, without sonographic corelate.      06/03/2016 Initial Biopsy    Right breast inferior lower quadrant core needle biopsy showed atypical ductal hyperplasia with calcifications      06/09/2016 Receptors her2    Breast biopsy showed ER 100% positive, PR 100% positive, HER-2 negative, Ki-67 40%      06/09/2016 Initial Diagnosis    Breast cancer of lower-inner quadrant of right female breast ([place])      06/09/2016 Initial Biopsy    Right breast inner quadrant core needle biopsy showed invasive ductal carcinoma and DCIS, grade 1-2      06/17/2016 Initial Biopsy    Right axillary lymph node core needle biopsy showed metastatic carcinoma      06/17/2016 Receptors her2    Axillary node biopsy showed ER 100% positive, PR 95% positive, HER-2 negative      06/19/2016 Imaging    Bilateral breast MRI showed locations in the lower inner right breast, largest 2.7X1.3X1.6cm, biopsy clips in 2 of this areas. There are abnormal right axillary lymph nodes showing second cortical's, no evidence of malignancy in the left breast.       HISTORY OF PRESENTING ILLNESS:  Tiffany Arnold 75 y.o. female is here because of her recently  diagnosed left breast cancer. She presents to my clinic with her friend, who is a Marine scientist.   Her cancer was discovered by screening mammogram. She had a right breast cyst in 2001, which was removed. She has been doing mammogram once a year. The mammogram and ultrasound on 05/23/2016 showed a suspicious architectural this portion, 1.3 cm, without sonographic correlation. She underwent core needle biopsy of the right breast mass twice and right axilla node biopsy, one breast biopsy and node biopsy showed invasive ductal carcinoma and DCIS, ER/PR strong positive, HER-2 negative.  She denies any other new symptoms. She has noticed mild fatigued lately, she still works full time in a vet's office, she has IBS, has intermittent constipation and diarrhea. She has arthritis, and both knee replacement and shoulder surgery before, she also has some back pain lately, she takes tylenol occasionally.  She lives with her husband, moderately active. No family history of breast cancer  GYN HISTORY  Menarchal: 13 LMP: 75 Contraceptive: 4-5 years HRT: 3 years  G2P2: no breast feeding, daughter 94 yo and son is 58 yo.     MEDICAL HISTORY:  Past Medical History:  Diagnosis Date  . Allergy   . Arthritis   . Asthma   . Cataract of both eyes   . Chicken pox   . Colon polyps   . Dog bite of index finger 07/23/2016   Left index finger  . GERD (gastroesophageal reflux disease)   . Hyperlipidemia   .  Hypertension   . IBS (irritable bowel syndrome)   . Phlebitis   . Pneumonia    hx of  . Shortness of breath dyspnea    with exertion  . Sleep apnea    wears CPAP machine nightly    SURGICAL HISTORY: Past Surgical History:  Procedure Laterality Date  . BACK SURGERY    . BREAST CYST EXCISION Right 2001   negative  . BREAST LUMPECTOMY WITH NEEDLE LOCALIZATION AND AXILLARY LYMPH NODE DISSECTION Right 07/29/2016   Procedure: RIGHT BREAST LUMPECTOMY WITH DOUBLE NEEDLE LOCALIZATION AND COMPLETE RIGHT AXILLARY  LYMPH NODE DISSECTION;  Surgeon: Fanny Skates, MD;  Location: [place];  Service: General;  Laterality: Right;  . BREAST SURGERY Left 2000   Biopsy  . BUNIONECTOMY Bilateral 1998   great toe fusion on right foot  . COLONOSCOPY W/ POLYPECTOMY    . HERNIA REPAIR  9211   [facility] SINUS SURGERY  2015  . REPLACEMENT TOTAL KNEE Bilateral 2007  . TOTAL SHOULDER REPLACEMENT Left 2007    SOCIAL HISTORY: Social History   Social History  . Marital status: Married    Spouse name: N/A  . Number of children: N/A  . Years of education: N/A   Occupational History  . Not on file.   Social History Main Topics  . Smoking status: Former Smoker    Packs/day: 2.00    Years: 25.00  . Smokeless tobacco: Never Used     Comment: quit 24 years ago  . Alcohol use Yes     Comment: rare  . Drug use: No  . Sexual activity: Not Currently   Other Topics Concern  . Not on file   Social History Narrative  . No narrative on file    FAMILY HISTORY: Family History  Problem Relation Age of Onset  . Arthritis Mother   . Stroke Mother   . Hypertension Mother   . Cancer Father     Prostate  . Stroke Father   . Hypertension Father   . Hypertension Maternal Grandmother   . Rheum arthritis Maternal Grandfather   . Stroke Maternal Grandfather   . Hypertension Maternal Grandfather   . Cancer Paternal Grandmother     Colon  . Hypertension Paternal Grandmother   . Hypertension Paternal Grandfather   . Breast cancer Neg Hx     ALLERGIES:  is allergic to other.  MEDICATIONS:  Current Outpatient Prescriptions  Medication Sig Dispense Refill  . albuterol (PROAIR HFA) 108 (90 BASE) MCG/ACT inhaler Inhale 2 puffs into the lungs every 6 (six) hours as needed for wheezing.     Marland Kitchen albuterol-ipratropium (COMBIVENT) 18-103 MCG/ACT inhaler Inhale 2 puffs into the lungs every 4 (four) hours as needed for wheezing or shortness of breath.     . ALPRAZolam (XANAX) 0.25 MG tablet TAKE ONE TABLET BY  MOUTH TWICE DAILY AS NEEDED (Patient taking differently: TAKE ONE TABLET BY MOUTH TWICE DAILY AS NEEDED FOR ANXIETY) 60 tablet 0  . aspirin EC 81 MG tablet Take 81 mg by mouth daily.    . Calcium Carbonate-Vitamin D (CALCIUM-VITAMIN D) 500-200 MG-UNIT tablet Take 1 tablet by mouth daily.    . cetirizine (ZYRTEC) 10 MG tablet Take 10 mg by mouth daily.    . Cholecalciferol (D3-1000 PO) Take 1 capsule by mouth every morning.    Marland Kitchen EPINEPHrine 0.3 mg/0.3 mL IJ SOAJ injection Inject 0.3 mg into the muscle once as needed (ALLERGIC REACTION).     Marland Kitchen glucosamine-chondroitin 500-400 MG  tablet Take 1 tablet by mouth 2 (two) times daily.    Marland Kitchen HYDROcodone-acetaminophen (NORCO/VICODIN) 5-325 MG tablet Take 1 tablet by mouth every 8 (eight) hours as needed for moderate pain or severe pain. 30 tablet 0  . methocarbamol (ROBAXIN) 500 MG tablet Take 1 tablet (500 mg total) by mouth at bedtime as needed for muscle spasms. 10 tablet 0  . naproxen sodium (ALEVE) 220 MG tablet Take 440 mg by mouth 2 (two) times daily as needed (pain).     . Olmesartan-Amlodipine-HCTZ 40-5-25 MG TABS TAKE ONE TABLET BY MOUTH ONCE DAILY 90 tablet 1  . Omega-3 Fatty Acids (FISH OIL) 1000 MG CAPS Take 1,000 mg by mouth daily.    Marland Kitchen oxybutynin (DITROPAN-XL) 10 MG 24 hr tablet Take 1 tablet (10 mg total) by mouth at bedtime. 30 tablet 5  . oxyCODONE (OXY IR/ROXICODONE) 5 MG immediate release tablet Take 1-2 tablets (5-10 mg total) by mouth every 4 (four) hours as needed for moderate pain. 30 tablet 0  . pravastatin (PRAVACHOL) 40 MG tablet TAKE ONE TABLET BY MOUTH ONCE DAILY 30 tablet 5  . ranitidine (ZANTAC) 150 MG tablet Take 150 mg by mouth daily.    . vitamin B-12 (CYANOCOBALAMIN) 1000 MCG tablet Take 1,000 mcg by mouth daily.     No current facility-administered medications for this visit.     REVIEW OF SYSTEMS:   Constitutional: Denies fevers, chills or abnormal night sweats Eyes: Denies blurriness of vision, double vision or watery  eyes Ears, nose, mouth, throat, and face: Denies mucositis or sore throat Respiratory: Denies cough, dyspnea or wheezes Cardiovascular: Denies palpitation, chest discomfort or lower extremity swelling Gastrointestinal:  Denies nausea, heartburn or change in bowel habits Skin: Denies abnormal skin rashes Lymphatics: Denies new lymphadenopathy or easy bruising Neurological:Denies numbness, tingling or new weaknesses Behavioral/Psych: Mood is stable, no new changes  All other systems were reviewed with the patient and are negative.  PHYSICAL EXAMINATION: ECOG PERFORMANCE STATUS: 0 - Asymptomatic  There were no vitals filed for this visit. There were no vitals filed for this visit.  GENERAL:alert, no distress and comfortable SKIN: skin color, texture, turgor are normal, no rashes or significant lesions EYES: normal, conjunctiva are pink and non-injected, sclera clear OROPHARYNX:no exudate, no erythema and lips, buccal mucosa, and tongue normal  NECK: supple, thyroid normal size, non-tender, without nodularity LYMPH:  no palpable lymphadenopathy in the cervical, axillary or inguinal LUNGS: clear to auscultation and percussion with normal breathing effort HEART: regular rate & rhythm and no murmurs and no lower extremity edema ABDOMEN:abdomen soft, non-tender and normal bowel sounds Musculoskeletal:no cyanosis of digits and no clubbing  PSYCH: alert & oriented x 3 with fluent speech NEURO: no focal motor/sensory deficits Breasts: Breast inspection showed them to be symmetrical with no nipple discharge. Palpation of the breasts and axilla revealed no obvious mass that I could appreciate. No bruise at the biopsy site.   LABORATORY DATA:  I have reviewed the data as listed CBC Latest Ref Rng & Units 07/29/2016 07/23/2016 04/02/2016  WBC 4.0 - 10.5 K/uL 11.9(H) 16.0(H) 8.3  Hemoglobin 12.0 - 15.0 g/dL 11.9(L) 14.3 13.7  Hematocrit 36.0 - 46.0 % 37.8 44.2 41.6  Platelets 150 - 400 K/uL 244 337  310.0   CMP Latest Ref Rng & Units 07/29/2016 07/23/2016 04/02/2016  Glucose 65 - 99 mg/dL - 129(H) 170(H)  BUN 6 - 20 mg/dL - 16 22  Creatinine 0.44 - 1.00 mg/dL 0.67 0.69 0.80  Sodium 135 - 145  mmol/L - 136 138  Potassium 3.5 - 5.1 mmol/L - 3.9 3.8  Chloride 101 - 111 mmol/L - 98(L) 100  CO2 22 - 32 mmol/L - 27 35(H)  Calcium 8.9 - 10.3 mg/dL - 10.4(H) 10.1  Total Protein 6.0 - 8.3 g/dL - - 7.1  Total Bilirubin 0.2 - 1.2 mg/dL - - 0.5  Alkaline Phos 39 - 117 U/L - - 68  AST 0 - 37 U/L - - 13  ALT 0 - 35 U/L - - 16    PATHOLOGY REPORT  Diagnosis 06/03/2016 Breast, right, needle core biopsy, ILQ focal 1.3 cm asymmetry/distortion - ATYPICAL DUCTAL HYPERPLASIA WITH CALCIFICATIONS. - FIBROCYSTIC CHANGES WITH CALCIFICATIONS. - SEE COMMENT. Microscopic Comment The results were called to [facility] on 06/04/16. (JBK:ds 06/04/16)   Diagnosis 06/09/2016 Breast, right, needle core biopsy, inner - INVASIVE DUCTAL CARCINOMA. - DUCTAL CARCINOMA IN SITU. - SEE COMMENT. Microscopic Comment The carcinoma appears grade 1-2. A breast prognostic profile will be performed and the results reported separately. The results were called to [facility] on 06/10/2016. (JBK:ecj 06/10/2016) Results: HER2 - NEGATIVE RATIO OF HER2/CEP17 SIGNALS 1.55 AVERAGE HER2 COPY NUMBER PER CELL 2.25  Results: IMMUNOHISTOCHEMICAL AND MORPHOMETRIC ANALYSIS PERFORMED MANUALLY Estrogen Receptor: 100%, POSITIVE, STRONG STAINING INTENSITY Progesterone Receptor: 100%, POSITIVE, STRONG STAINING INTENSITY Proliferation Marker Ki67: 40%   Diagnosis 06/17/2016 Lymph node, needle/core biopsy, right, inferior, axilla to far lateral breast - METASTATIC CARCINOMA, SEE COMMENT. Microscopic Comment The morphology is consistent with the patient breast carcinoma. Prognostic markers will be ordered and reported in an addendum. The case was called to [facility] on  06/18/2016. Results: IMMUNOHISTOCHEMICAL AND MORPHOMETRIC ANALYSIS PERFORMED MANUALLY Estrogen Receptor: 100%, POSITIVE, STRONG STAINING INTENSITY Progesterone Receptor: 95%, POSITIVE, STRONG STAINING INTENSITY  Results: HER2 - NEGATIVE RATIO OF HER2/CEP17 SIGNALS 1.25 AVERAGE HER2 COPY NUMBER PER CELL 2.00  ORT OF SURGICAL PATHOLOGY FINAL DIAGNOSIS Diagnosis 07/29/2016 1. Breast, lumpectomy, Right INVASIVE DUCTAL CARCINOMA, GRADE 3, SPANNING 1.5 CM DUCTAL CARCINOMA IN SITU IS PRESENT ALL MARGINS OF RESECTION ARE NEGATIVE FOR CARCINOMA 2. Lymph nodes, regional resection, Right axillary contents METASTATIC BREAST DUCTAL CARCINOMA IN ONE OF SIXTEEN LYMPH NODES (1/16) Microscopic Comment 1. BREAST, INVASIVE TUMOR, WITH LYMPH NODES PRESENT Specimen, including laterality and lymph node sampling (sentinel, non-sentinel): Right partial breast and regional lymph nodes Procedure: Lumpectomy Histologic type: Ductal carcinoma Grade: 3 Tubule formation: 3 Nuclear pleomorphism: 2 Mitotic:3 Tumor size (gross measurement or glass slide measurement): 1.5 cm Margins: Invasive, distance to closest margin: 0.7 cm In-situ, distance to closest margin: 0.7 cm If margin positive, focally or broadly: NA Lymphovascular invasion: Not identified Ductal carcinoma in situ: Present Grade: 3 Extensive intraductal component: moderate Lobular neoplasia: Negative Tumor focality: Focal Treatment effect: Negative If present, treatment effect in breast tissue, lymph nodes or both: NA Extent of tumor: 1 of 3 FINAL for JANAA, ACERO ([id number]) Microscopic Comment(continued) Skin: Negative Nipple: Negative Skeletal muscle: Negative Lymph nodes: Examined: 0 Sentinel 16 Non-sentinel 16 Total Lymph nodes with metastasis: 1 Isolated tumor cells (< 0.2 mm): 0 Micrometastasis: (> 0.2 mm and < 2.0 mm): 0 Macrometastasis: (> 2.0 mm): 1 Extracapsular extension: Present Breast prognostic  profile: Estrogen receptor: 100% Progesterone receptor: 100% Her 2 neu: Negative Ki-67: 40% Non-neoplastic breast: Unremarkable TNM: pT1c, pN1  RADIOGRAPHIC STUDIES: I have personally reviewed the radiological images as listed and agreed with the findings in the report. Chest 2 View  Result Date: 07/23/2016 CLINICAL DATA:  Preoperative  examination prior right breast lumpectomy and lymph node dissection. History of hypertension, former smoker, asthma EXAM: CHEST  2 VIEW COMPARISON:  None in PACs FINDINGS: The lungs are mildly hyperinflated with hemidiaphragm flattening. There is no focal infiltrate. The interstitial markings are coarse in the right infrahilar region. The heart and pulmonary vascularity are normal. The mediastinum is normal in width. There is faint calcification in the wall of the aortic arch. There is multilevel degenerative disc disease of the thoracic spine with moderate dextrocurvature centered in the mid thoracic spine. There is a prosthetic left shoulder joint. IMPRESSION: No active cardiopulmonary disease. Aortic atherosclerosis. Electronically Signed   By: David  Martinique M.D.   On: 07/23/2016 16:13   Dg Lumbar Spine Complete  Result Date: 07/17/2016 CLINICAL DATA:  Low back pain.  Right sciatica. EXAM: LUMBAR SPINE - COMPLETE 4+ VIEW COMPARISON:  No recent prior . FINDINGS: Exam limited by technique. Lumbar vertebra difficult to number, the lumbar vertebra numbered with the lowest segmented appearing lumbar shaped vertebral lateral view as L5. This may be a transitional vertebra. Degenerative changes lumbar spine and both hips. No acute bony abnormality identified. Severe multilevel degenerative change with scoliosis concave right. 4 mm anterolisthesis L4 on L5 noted. Aortic atherosclerotic vascular calcification . Calcifications in the but most likely injection granulomas. IMPRESSION: 1. Limited exam. Severe degenerative changes scoliosis lumbar spine. Mild anterolisthesis L4 on  L5. No acute bony abnormality. 2. Aortoiliac atherosclerotic vascular disease. Electronically Signed   By: Marcello Moores  Register   On: 07/17/2016 15:43  Mm Breast Surgical Specimen  Result Date: 07/29/2016 CLINICAL DATA:  Post right lumpectomy for right breast cancer and atypical ductal hyperplasia. EXAM: SPECIMEN RADIOGRAPH OF THE RIGHT BREAST COMPARISON:  Previous exam(s). FINDINGS: Status post excision of the right breast. The two wire tips and both coil shaped and X shaped biopsy marker clips are present and are marked for pathology. IMPRESSION: Specimen radiograph of the right breast. Electronically Signed   By: Everlean Alstrom M.D.   On: 07/29/2016 12:35   Mm Rt Plc Breast Loc Dev   1st Lesion  Inc Mammo Guide  Result Date: 07/29/2016 CLINICAL DATA:  Patient with biopsy-proven invasive cancer and biopsy-proven ADH within the right breast. Patient is scheduled for breast conservation surgery today requiring preoperative needle localizations. EXAM: NEEDLE LOCALIZATION OF THE RIGHT BREAST WITH MAMMO GUIDANCE COMPARISON:  Previous exams. FINDINGS: Patient presents for needle localization prior to lumpectomies. I met with the patient and we discussed the procedure of needle localization including benefits and alternatives. We discussed the high likelihood of a successful procedure. We discussed the risks of the procedure, including infection, bleeding, tissue injury, and further surgery. Informed, written consent was given. The usual time-out protocol was performed immediately prior to the procedure. Using mammographic guidance, sterile technique, 1% lidocaine and a 9 cm modified Kopans needle, the coil shaped clip within the lower inner quadrant of the right breast was localized using medial approach. Next, using mammographic guidance, sterile technique, 1% lidocaine and a 7 cm modified Kopan's needle, the X shaped clip within the lower inner quadrant of the right breast was localized using a medial approach.  The images were marked for Dr. Dalbert Batman. IMPRESSION: Needle localization right breast x2. No apparent complications. Electronically Signed   By: Franki Cabot M.D.   On: 07/29/2016 10:16   Mm Rt Plc Breast Loc Dev   Ea Add Lesion  Inc Mammo Guide  Result Date: 07/29/2016 CLINICAL DATA:  Patient with biopsy-proven invasive cancer and  biopsy-proven ADH within the right breast. Patient is scheduled for breast conservation surgery today requiring preoperative needle localizations. EXAM: NEEDLE LOCALIZATION OF THE RIGHT BREAST WITH MAMMO GUIDANCE COMPARISON:  Previous exams. FINDINGS: Patient presents for needle localization prior to lumpectomies. I met with the patient and we discussed the procedure of needle localization including benefits and alternatives. We discussed the high likelihood of a successful procedure. We discussed the risks of the procedure, including infection, bleeding, tissue injury, and further surgery. Informed, written consent was given. The usual time-out protocol was performed immediately prior to the procedure. Using mammographic guidance, sterile technique, 1% lidocaine and a 9 cm modified Kopans needle, the coil shaped clip within the lower inner quadrant of the right breast was localized using medial approach. Next, using mammographic guidance, sterile technique, 1% lidocaine and a 7 cm modified Kopan's needle, the X shaped clip within the lower inner quadrant of the right breast was localized using a medial approach. The images were marked for Dr. Dalbert Batman. IMPRESSION: Needle localization right breast x2. No apparent complications. Electronically Signed   By: Franki Cabot M.D.   On: 07/29/2016 10:16   Diagnostic mammogram and ultrasound of right breast including right axillary 05/23/2016 IMPRESSION: Suspicious architectural distortion within the lower inner quadrant of the right breast, measuring 1.3 cm greatest dimension, without sonographic correlate. This distortion could  conceivably be related to the patient's earlier surgical excision biopsy perform in 2001 (patient states that this earlier surgical excision was in this same region of her right breast), however, exclusion of a neoplastic cause is needed.  As such, stereotactic-guided biopsy, with 3D tomosynthesis, is recommended for this suspicious finding.   ASSESSMENT & PLAN: 75 year old Caucasian female, with mammogram discovered right breast cancer.  1. 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 -She probably has multifocal right breast cancer, with lymph node metastasis. The axillary lymph nodes are not palpable on clinical exam. -She has been seen by breast surgeon Dr. Dalbert Batman. All 3 lesions are in the same quadrant of right breast, hopefully she can still have lumpectomy, in stead of mastectomy. -I discussed the risk of cancer recurrence after complete surgical resection. Given her nodal metastasis, her risk of recurrence is probably at least moderate, if not high. -Given them relatively small size of her individual tumors, If lumpectomy is feasible, I recommend her to have surgery first, and we have more accurate staging. -I discussed the role of tumor therapy, in the setting of neoadjuvant or adjuvant. Her tumor is strongly ER/PR positive, HER-2 negative, moderate Ki67. I recommend mammaprint genomic testing for further evaluate the risk of recurrence, and the benefit from chemotherapy. If it shows high risk, I would recommend chemotherapy.  -She would need postop breast and axilla radiation to reduce her risk of local recurrence, giving the node positive disease. -Given the strong ER/PR positivity, I strongly recommend adjuvant endocrine therapy with aromatase inhibitor. The benefit and side effects were discussed with her. She is  interested. -We finally discussed the breast cancer surveillance, after she completes surgery and radiation. She will have annual screening mammogram, routine office visit with exam and lab.   2. Hypertension and arthritis -She will continue medication and follow-up with her primary care physician  3. Morbid obesity -I encouraged her to have healthy diet and exercise regularly, try to lose some weight.  Plan -  I will discuss with Dr. Wyonia Hough, she will likely have breast surgery first -Mammaprint on surgical tumor -I'll see her 3 weeks after her surgery to discuss her mammogram result and decide if she needs adjuvant chemotherapy -She was seen by our breast cancer navigator Dawn after my visit  All questions were answered. The patient knows to call the clinic with any problems, questions or concerns. I spent 55 minutes counseling the patient face to face. The total time spent in the appointment was 60 minutes and more than 50% was on counseling.     Truitt Merle, MD 08/13/2016 7:08 AM       [place]  [phone] Fax:([phone]) [place] Consult Note   Patient Care Team: [name], NP as PCP - General (Internal Medicine) 08/18/2016  Referring physician: Dr. Dalbert Batman  CHIEF COMPLAINTS/PURPOSE OF CONSULTATION:  Newly diagnosed right breast cancer  . Oncology History   Breast cancer of lower-inner quadrant of right female breast [facility])   Staging form: Breast, AJCC 7th Edition   - Clinical stage from 06/09/2016: Stage IIB (T2, N1, M0) - Signed by Truitt Merle, MD on 06/27/2016   - Pathologic stage from 07/29/2016: Stage IIA (T1c, N1a, cM0) - Signed by Truitt Merle, MD on 08/13/2016       Breast cancer of lower-inner quadrant of right female breast ([place])   05/23/2016 Mammogram    Diagnostic mammogram and ultrasound showed suspicious architectural distortion within the right breast lower inner quadrant, measuring 1.3 cm, without sonographic corelate.       06/03/2016 Initial Biopsy    Right breast inferior lower quadrant core needle biopsy showed atypical ductal hyperplasia with calcifications      06/09/2016 Receptors her2    Breast biopsy showed ER 100% positive, PR 100% positive, HER-2 negative, Ki-67 40%      06/09/2016 Initial Diagnosis    Breast cancer of lower-inner quadrant of right female breast ([place])      06/09/2016 Initial Biopsy    Right breast inner quadrant core needle biopsy showed invasive ductal carcinoma and DCIS, grade 1-2      06/17/2016 Initial Biopsy    Right axillary lymph node core needle biopsy showed metastatic carcinoma      06/17/2016 Receptors her2    Axillary node biopsy showed ER 100% positive, PR 95% positive, HER-2 negative      06/19/2016 Imaging    Bilateral breast MRI showed locations in the lower inner right breast, largest 2.7X1.3X1.6cm, biopsy clips in 2 of this areas. There are abnormal right axillary lymph nodes showing second cortical's, no evidence of malignancy in the left breast.      07/29/2016 Surgery    Right lumpectomy and ALND      07/29/2016 Pathology Results    Right lumpectomy showed G3 IDC, DCIS, margins (-), LVI(-).  1 of 16 nodes was positive        HISTORY OF PRESENTING ILLNESS:  Tiffany Arnold 75 y.o. female is here because of her recently diagnosed left breast cancer. She presents to my clinic with her friend, who is a Marine scientist.   Her cancer was discovered by screening mammogram. She had a right breast cyst in 2001, which was removed. She has been doing mammogram once a year. The mammogram and ultrasound on 05/23/2016 showed a suspicious architectural this portion, 1.3 cm, without sonographic correlation. She underwent core needle biopsy of the right breast mass twice and right axilla node biopsy, one breast biopsy and node  biopsy showed invasive ductal carcinoma and DCIS, ER/PR strong positive, HER-2 negative.  She denies any other new symptoms. She has noticed mild fatigued  lately, she still works full time in a vet's office, she has IBS, has intermittent constipation and diarrhea. She has arthritis, and both knee replacement and shoulder surgery before, she also has some back pain lately, she takes tylenol occasionally.  She lives with her husband, moderately active. No family history of breast cancer  GYN HISTORY  Menarchal: 13 LMP: 23 Contraceptive: 4-5 years HRT: 3 years  G2P2: no breast feeding, daughter 66 yo and son is 46 yo.     MEDICAL HISTORY:  Past Medical History:  Diagnosis Date  . Allergy   . Arthritis   . Asthma   . Cataract of both eyes   . Chicken pox   . Colon polyps   . Dog bite of index finger 07/23/2016   Left index finger  . GERD (gastroesophageal reflux disease)   . Hyperlipidemia   . Hypertension   . IBS (irritable bowel syndrome)   . Phlebitis   . Pneumonia    hx of  . Shortness of breath dyspnea    with exertion  . Sleep apnea    wears CPAP machine nightly    SURGICAL HISTORY: Past Surgical History:  Procedure Laterality Date  . BACK SURGERY    . BREAST CYST EXCISION Right 2001   negative  . BREAST LUMPECTOMY WITH NEEDLE LOCALIZATION AND AXILLARY LYMPH NODE DISSECTION Right 07/29/2016   Procedure: RIGHT BREAST LUMPECTOMY WITH DOUBLE NEEDLE LOCALIZATION AND COMPLETE RIGHT AXILLARY LYMPH NODE DISSECTION;  Surgeon: Fanny Skates, MD;  Location: [facility];  Service: General;  Laterality: Right;  . BREAST SURGERY Left 2000   Biopsy  . BUNIONECTOMY Bilateral 1998   great toe fusion on right foot  . COLONOSCOPY W/ POLYPECTOMY    . HERNIA REPAIR  [id number]   [place] S[facility] SURGERY  2015  . REPLACEMENT TOTAL KNEE Bilateral 2007  . TOTAL SHOULDER REPLACEMENT Left 2007    SOCIAL HISTORY: Social History   Social History  . Marital status: Married    Spouse name: N/A  . Number of children: N/A  . Years of education: N/A   Occupational History  . Not on file.   Social History Main Topics  . Smoking  status: Former Smoker    Packs/day: 2.00    Years: 25.00  . Smokeless tobacco: Never Used     Comment: quit 24 years ago  . Alcohol use Yes     Comment: rare  . Drug use: No  . Sexual activity: Not Currently   Other Topics Concern  . Not on file   Social History Narrative  . No narrative on file    FAMILY HISTORY: Family History  Problem Relation Age of Onset  . Arthritis Mother   . Stroke Mother   . Hypertension Mother   . Cancer Father     Prostate  . Stroke Father   . Hypertension Father   . Hypertension Maternal Grandmother   . Rheum arthritis Maternal Grandfather   . Stroke Maternal Grandfather   . Hypertension Maternal Grandfather   . Cancer Paternal Grandmother     Colon  . Hypertension Paternal Grandmother   . Hypertension Paternal Grandfather   . Breast cancer Neg Hx     ALLERGIES:  is allergic to other.  MEDICATIONS:  Current Outpatient Prescriptions  Medication Sig Dispense Refill  . albuterol (PROAIR HFA)  108 (90 BASE) MCG/ACT inhaler Inhale 2 puffs into the lungs every 6 (six) hours as needed for wheezing.     Marland Kitchen albuterol-ipratropium (COMBIVENT) 18-103 MCG/ACT inhaler Inhale 2 puffs into the lungs every 4 (four) hours as needed for wheezing or shortness of breath.     . ALPRAZolam (XANAX) 0.25 MG tablet TAKE ONE TABLET BY MOUTH TWICE DAILY AS NEEDED (Patient taking differently: TAKE ONE TABLET BY MOUTH TWICE DAILY AS NEEDED FOR ANXIETY) 60 tablet 0  . aspirin EC 81 MG tablet Take 81 mg by mouth daily.    . Calcium Carbonate-Vitamin D (CALCIUM-VITAMIN D) 500-200 MG-UNIT tablet Take 1 tablet by mouth daily.    . cetirizine (ZYRTEC) 10 MG tablet Take 10 mg by mouth daily.    . Cholecalciferol (D3-1000 PO) Take 1 capsule by mouth every morning.    Marland Kitchen EPINEPHrine 0.3 mg/0.3 mL IJ SOAJ injection Inject 0.3 mg into the muscle once as needed (ALLERGIC REACTION).     Marland Kitchen glucosamine-chondroitin 500-400 MG tablet Take 1 tablet by mouth 2 (two) times daily.    Marland Kitchen  HYDROcodone-acetaminophen (NORCO/VICODIN) 5-325 MG tablet Take 1 tablet by mouth every 8 (eight) hours as needed for moderate pain or severe pain. 30 tablet 0  . methocarbamol (ROBAXIN) 500 MG tablet Take 1 tablet (500 mg total) by mouth at bedtime as needed for muscle spasms. 10 tablet 0  . naproxen sodium (ALEVE) 220 MG tablet Take 440 mg by mouth 2 (two) times daily as needed (pain).     . Olmesartan-Amlodipine-HCTZ 40-5-25 MG TABS TAKE ONE TABLET BY MOUTH ONCE DAILY 90 tablet 1  . Omega-3 Fatty Acids (FISH OIL) 1000 MG CAPS Take 1,000 mg by mouth daily.    Marland Kitchen oxybutynin (DITROPAN-XL) 10 MG 24 hr tablet Take 1 tablet (10 mg total) by mouth at bedtime. 30 tablet 5  . oxyCODONE (OXY IR/ROXICODONE) 5 MG immediate release tablet Take 1-2 tablets (5-10 mg total) by mouth every 4 (four) hours as needed for moderate pain. 30 tablet 0  . pravastatin (PRAVACHOL) 40 MG tablet TAKE ONE TABLET BY MOUTH ONCE DAILY 30 tablet 5  . ranitidine (ZANTAC) 150 MG tablet Take 150 mg by mouth daily.    . vitamin B-12 (CYANOCOBALAMIN) 1000 MCG tablet Take 1,000 mcg by mouth daily.     No current facility-administered medications for this visit.     REVIEW OF SYSTEMS:   Constitutional: Denies fevers, chills or abnormal night sweats Eyes: Denies blurriness of vision, double vision or watery eyes Ears, nose, mouth, throat, and face: Denies mucositis or sore throat Respiratory: Denies cough, dyspnea or wheezes Cardiovascular: Denies palpitation, chest discomfort or lower extremity swelling Gastrointestinal:  Denies nausea, heartburn or change in bowel habits Skin: Denies abnormal skin rashes Lymphatics: Denies new lymphadenopathy or easy bruising Neurological:Denies numbness, tingling or new weaknesses Behavioral/Psych: Mood is stable, no new changes  All other systems were reviewed with the patient and are negative.  PHYSICAL EXAMINATION: ECOG PERFORMANCE STATUS: 0 - Asymptomatic  There were no vitals filed for  this visit. There were no vitals filed for this visit.  GENERAL:alert, no distress and comfortable SKIN: skin color, texture, turgor are normal, no rashes or significant lesions EYES: normal, conjunctiva are pink and non-injected, sclera clear OROPHARYNX:no exudate, no erythema and lips, buccal mucosa, and tongue normal  NECK: supple, thyroid normal size, non-tender, without nodularity LYMPH:  no palpable lymphadenopathy in the cervical, axillary or inguinal LUNGS: clear to auscultation and percussion with normal breathing effort HEART:  regular rate & rhythm and no murmurs and no lower extremity edema ABDOMEN:abdomen soft, non-tender and normal bowel sounds Musculoskeletal:no cyanosis of digits and no clubbing  PSYCH: alert & oriented x 3 with fluent speech NEURO: no focal motor/sensory deficits Breasts: Breast inspection showed them to be symmetrical with no nipple discharge. Palpation of the breasts and axilla revealed no obvious mass that I could appreciate. No bruise at the biopsy site.   LABORATORY DATA:  I have reviewed the data as listed CBC Latest Ref Rng & Units 07/29/2016 07/23/2016 04/02/2016  WBC 4.0 - 10.5 K/uL 11.9(H) 16.0(H) 8.3  Hemoglobin 12.0 - 15.0 g/dL 11.9(L) 14.3 13.7  Hematocrit 36.0 - 46.0 % 37.8 44.2 41.6  Platelets 150 - 400 K/uL 244 337 310.0   CMP Latest Ref Rng & Units 07/29/2016 07/23/2016 04/02/2016  Glucose 65 - 99 mg/dL - 129(H) 170(H)  BUN 6 - 20 mg/dL - 16 22  Creatinine 0.44 - 1.00 mg/dL 0.67 0.69 0.80  Sodium 135 - 145 mmol/L - 136 138  Potassium 3.5 - 5.1 mmol/L - 3.9 3.8  Chloride 101 - 111 mmol/L - 98(L) 100  CO2 22 - 32 mmol/L - 27 35(H)  Calcium 8.9 - 10.3 mg/dL - 10.4(H) 10.1  Total Protein 6.0 - 8.3 g/dL - - 7.1  Total Bilirubin 0.2 - 1.2 mg/dL - - 0.5  Alkaline Phos 39 - 117 U/L - - 68  AST 0 - 37 U/L - - 13  ALT 0 - 35 U/L - - 16    PATHOLOGY REPORT  Diagnosis 06/03/2016 Breast, right, needle core biopsy, ILQ focal 1.3 cm  asymmetry/distortion - ATYPICAL DUCTAL HYPERPLASIA WITH CALCIFICATIONS. - FIBROCYSTIC CHANGES WITH CALCIFICATIONS. - SEE COMMENT. Microscopic Comment The results were called to [facility] on 06/04/16. (JBK:ds 06/04/16)   Diagnosis 06/09/2016 Breast, right, needle core biopsy, inner - INVASIVE DUCTAL CARCINOMA. - DUCTAL CARCINOMA IN SITU. - SEE COMMENT. Microscopic Comment The carcinoma appears grade 1-2. A breast prognostic profile will be performed and the results reported separately. The results were called to [facility] on 06/10/2016. (JBK:ecj 06/10/2016) Results: HER2 - NEGATIVE RATIO OF HER2/CEP17 SIGNALS 1.55 AVERAGE HER2 COPY NUMBER PER CELL 2.25  Results: IMMUNOHISTOCHEMICAL AND MORPHOMETRIC ANALYSIS PERFORMED MANUALLY Estrogen Receptor: 100%, POSITIVE, STRONG STAINING INTENSITY Progesterone Receptor: 100%, POSITIVE, STRONG STAINING INTENSITY Proliferation Marker Ki67: 40%   Diagnosis 06/17/2016 Lymph node, needle/core biopsy, right, inferior, axilla to far lateral breast - METASTATIC CARCINOMA, SEE COMMENT. Microscopic Comment The morphology is consistent with the patient breast carcinoma. Prognostic markers will be ordered and reported in an addendum. The case was called to [facility] on 06/18/2016. Results: IMMUNOHISTOCHEMICAL AND MORPHOMETRIC ANALYSIS PERFORMED MANUALLY Estrogen Receptor: 100%, POSITIVE, STRONG STAINING INTENSITY Progesterone Receptor: 95%, POSITIVE, STRONG STAINING INTENSITY  Results: HER2 - NEGATIVE RATIO OF HER2/CEP17 SIGNALS 1.25 AVERAGE HER2 COPY NUMBER PER CELL 2.00    RADIOGRAPHIC STUDIES: I have personally reviewed the radiological images as listed and agreed with the findings in the report. Chest 2 View  Result Date: 07/23/2016 CLINICAL DATA:  Preoperative examination prior right breast lumpectomy and lymph node dissection. History of hypertension, former smoker, asthma EXAM:  CHEST  2 VIEW COMPARISON:  None in PACs FINDINGS: The lungs are mildly hyperinflated with hemidiaphragm flattening. There is no focal infiltrate. The interstitial markings are coarse in the right infrahilar region. The heart and pulmonary vascularity are normal. The mediastinum is normal in width. There is faint calcification in the  wall of the aortic arch. There is multilevel degenerative disc disease of the thoracic spine with moderate dextrocurvature centered in the mid thoracic spine. There is a prosthetic left shoulder joint. IMPRESSION: No active cardiopulmonary disease. Aortic atherosclerosis. Electronically Signed   By: David  Martinique M.D.   On: 07/23/2016 16:13   Mm Breast Surgical Specimen  Result Date: 07/29/2016 CLINICAL DATA:  Post right lumpectomy for right breast cancer and atypical ductal hyperplasia. EXAM: SPECIMEN RADIOGRAPH OF THE RIGHT BREAST COMPARISON:  Previous exam(s). FINDINGS: Status post excision of the right breast. The two wire tips and both coil shaped and X shaped biopsy marker clips are present and are marked for pathology. IMPRESSION: Specimen radiograph of the right breast. Electronically Signed   By: Everlean Alstrom M.D.   On: 07/29/2016 12:35   Mm Rt Plc Breast Loc Dev   1st Lesion  Inc Mammo Guide  Result Date: 07/29/2016 CLINICAL DATA:  Patient with biopsy-proven invasive cancer and biopsy-proven ADH within the right breast. Patient is scheduled for breast conservation surgery today requiring preoperative needle localizations. EXAM: NEEDLE LOCALIZATION OF THE RIGHT BREAST WITH MAMMO GUIDANCE COMPARISON:  Previous exams. FINDINGS: Patient presents for needle localization prior to lumpectomies. I met with the patient and we discussed the procedure of needle localization including benefits and alternatives. We discussed the high likelihood of a successful procedure. We discussed the risks of the procedure, including infection, bleeding, tissue injury, and further surgery.  Informed, written consent was given. The usual time-out protocol was performed immediately prior to the procedure. Using mammographic guidance, sterile technique, 1% lidocaine and a 9 cm modified Kopans needle, the coil shaped clip within the lower inner quadrant of the right breast was localized using medial approach. Next, using mammographic guidance, sterile technique, 1% lidocaine and a 7 cm modified Kopan's needle, the X shaped clip within the lower inner quadrant of the right breast was localized using a medial approach. The images were marked for Dr. Dalbert Batman. IMPRESSION: Needle localization right breast x2. No apparent complications. Electronically Signed   By: Franki Cabot M.D.   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 Patient is scheduled for breast conservation surgery today requiring preoperative needle localizations. EXAM: NEEDLE LOCALIZATION OF THE RIGHT BREAST WITH MAMMO GUIDANCE COMPARISON:  Previous exams. FINDINGS: Patient presents for needle localization prior to lumpectomies. I met with the patient and we discussed the procedure of needle localization including benefits and alternatives. We discussed the high likelihood of a successful procedure. We discussed the risks of the procedure, including infection, bleeding, tissue injury, and further surgery. Informed, written consent was given. The usual time-out protocol was performed immediately prior to the procedure. Using mammographic guidance, sterile technique, 1% lidocaine and a 9 cm modified Kopans needle, the coil shaped clip within the lower inner quadrant of the right breast was localized using medial approach. Next, using mammographic guidance, sterile technique, 1% lidocaine and a 7 cm modified Kopan's needle, the X shaped clip within the lower inner quadrant of the  right breast was localized using a medial approach. The images were marked for Dr. Dalbert Batman. IMPRESSION: Needle localization right breast x2. No apparent complications. Electronically Signed   By: Franki Cabot M.D.   On: 07/29/2016 10:16   Diagnostic mammogram and ultrasound of right breast including right axillary 05/23/2016 IMPRESSION:  Suspicious architectural distortion within the lower inner quadrant of the right breast, measuring 1.3 cm greatest dimension, without sonographic correlate. This distortion could conceivably be related to the patient's earlier surgical excision biopsy perform in 2001 (patient states that this earlier surgical excision was in this same region of her right breast), however, exclusion of a neoplastic cause is needed.  As such, stereotactic-guided biopsy, with 3D tomosynthesis, is recommended for this suspicious finding.   ASSESSMENT & PLAN: 75 year old Caucasian female, with mammogram discovered right breast cancer.  1. 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 -She probably has multifocal right breast cancer, with lymph node metastasis. The axillary lymph nodes are not palpable on clinical exam. -She has been seen by breast surgeon Dr. Dalbert Batman. All 3 lesions are in the same quadrant of right breast, hopefully she can still have lumpectomy, in stead of mastectomy. -I discussed the risk of cancer recurrence after complete surgical resection. Given her nodal metastasis, her risk of recurrence is probably at least moderate, if not high. -Given them relatively small size of her individual tumors, If lumpectomy is feasible, I recommend her to have surgery first, and we have more accurate staging. -I discussed the role of tumor therapy, in the setting of neoadjuvant or adjuvant. Her tumor is strongly  ER/PR positive, HER-2 negative, moderate Ki67. I recommend mammaprint genomic testing for further evaluate the risk of recurrence, and the benefit from chemotherapy. If it shows high risk, I would recommend chemotherapy.  -She would need postop breast and axilla radiation to reduce her risk of local recurrence, giving the node positive disease. -Given the strong ER/PR positivity, I strongly recommend adjuvant endocrine therapy with aromatase inhibitor. The benefit and side effects were discussed with her. She is interested. -We finally discussed the breast cancer surveillance, after she completes surgery and radiation. She will have annual screening mammogram, routine office visit with exam and lab.   2. Hypertension and arthritis -She will continue medication and follow-up with her primary care physician  3. Morbid obesity -I encouraged her to have healthy diet and exercise regularly, try to lose some weight.  Plan -I will discuss with Dr. Wyonia Hough, she will likely have breast surgery first -Mammaprint on surgical tumor -I'll see her 3 weeks after her surgery to discuss her mammogram result and decide if she needs adjuvant chemotherapy -She was seen by our breast cancer navigator Dawn after my visit  All questions were answered. The patient knows to call the clinic with any problems, questions or concerns. I spent 55 minutes counseling the patient face to face. The total time spent in the appointment was 60 minutes and more than 50% was on counseling.     Truitt Merle, MD 08/18/2016 6:19 AM

## 2016-08-18 ENCOUNTER — Ambulatory Visit: Payer: Medicare PPO | Admitting: Hematology

## 2016-08-18 ENCOUNTER — Telehealth: Payer: Self-pay | Admitting: *Deleted

## 2016-08-18 NOTE — Telephone Encounter (Signed)
Called pt per Dr Ernestina Penna request & informed that she did not need to come to appt today since mammoprint not back.  We will r/s when information is back.  Understanding expressed.

## 2016-08-19 ENCOUNTER — Telehealth: Payer: Self-pay | Admitting: Hematology

## 2016-08-19 NOTE — Telephone Encounter (Signed)
Spoke with pt to confirm 8/31 appt at 930 am per LOS

## 2016-08-21 ENCOUNTER — Ambulatory Visit (HOSPITAL_BASED_OUTPATIENT_CLINIC_OR_DEPARTMENT_OTHER): Payer: Medicare PPO | Admitting: Hematology

## 2016-08-21 VITALS — BP 126/73 | HR 72 | Temp 98.1°F | Resp 18 | Ht 61.0 in | Wt 244.5 lb

## 2016-08-21 DIAGNOSIS — Z170 Estrogen receptor positive status [ER+]: Secondary | ICD-10-CM

## 2016-08-21 DIAGNOSIS — C50311 Malignant neoplasm of lower-inner quadrant of right female breast: Secondary | ICD-10-CM | POA: Diagnosis not present

## 2016-08-21 DIAGNOSIS — C773 Secondary and unspecified malignant neoplasm of axilla and upper limb lymph nodes: Secondary | ICD-10-CM

## 2016-08-21 NOTE — Progress Notes (Signed)
Tiffany Arnold  [phone] [phone]  Clinic Follow Up Note   Patient Care Team: Tiffany Fenton, NP as PCP - General (Internal Medicine) 08/21/2016   CHIEF COMPLAINTS:  Follow Up right breast cancer  . Oncology History   Breast cancer of lower-inner quadrant of right female breast Tiffany Arnold)   Staging form: Breast, AJCC 7th Edition   - Clinical stage from 06/09/2016: Stage IIB (T2, N1, M0) - Signed by Tiffany Merle, MD on 06/27/2016   - Pathologic stage from 07/29/2016: Stage IIA (T1c, N1a, cM0) - Signed by Tiffany Merle, MD on 08/13/2016       Breast cancer of lower-inner quadrant of right female breast (Tiffany Arnold)   05/23/2016 Mammogram    Diagnostic mammogram and ultrasound showed suspicious architectural distortion within the right breast lower inner quadrant, measuring 1.3 cm, without sonographic corelate.      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 There are abnormal right axillary lymph nodes showing second cortical's, no evidence of malignancy in the left breast.      07/29/2016 Surgery    Right lumpectomy and ALND      07/29/2016  Pathology Results    Right lumpectomy showed G3 IDC, DCIS, margins (-), LVI(-).  1 of 16 nodes was positive       07/29/2016 Miscellaneous    Mammaprint showed high risk disease, luminal type B       HISTORY OF PRESENTING ILLNESS:  Tiffany Arnold 75 y.o. female is here because of her recently diagnosed left breast cancer. She presents to my clinic with her friend, who is a Tiffany Arnold.   Her cancer was discovered by screening mammogram. She had a right breast cyst in 2001, which was removed. She has been doing mammogram once a year. The mammogram and ultrasound on 05/23/2016 showed a suspicious architectural this portion, 1.3 cm, without sonographic correlation. She underwent core needle biopsy of the right breast mass twice and right axilla node biopsy, one breast biopsy and node biopsy showed invasive ductal carcinoma and DCIS, ER/PR strong positive, HER-2 negative.  She denies any other new symptoms. She has noticed mild fatigued lately, she still works full time in a vet's office, she has IBS, has intermittent constipation and diarrhea. She has arthritis, and both knee replacement and shoulder surgery before, she also has some back pain lately, she takes tylenol occasionally.  She lives with her husband, moderately active. No family history of breast cancer  GYN HISTORY  Menarchal: 13 LMP: 84 Contraceptive: 4-5 years HRT: 3 years  G2P2: no breast feeding, daughter  46 yo and son is 83 yo.   CURRENT THERAPY: pending adjuvant therapy  INTERIM HISTORY: Tiffany Arnold returns for follow up. She is accompanied by her friend to the clinic today. She underwent right breast lumpectomy and ALND on 8/8. She tolerated the surgery well, still has one drainage tube in, and is going to see Dr. Dalbert Arnold later today. She had mild soreness at the surgical incisions, takes Advil as need. she denies other significant pain, right arm swelling or other new symptoms. She has recovered well from the surgery. She is  here to discuss adjuvant chemo.    MEDICAL HISTORY:  Past Medical History:  Diagnosis Date  . Allergy   . Arthritis   . Asthma   . Cataract of both eyes   . Chicken pox   . Colon polyps   . Dog bite of index finger 07/23/2016   Left index finger  . GERD (gastroesophageal reflux disease)   . Hyperlipidemia   . Hypertension   . IBS (irritable bowel syndrome)   . Phlebitis   . Pneumonia    hx of  . Shortness of breath dyspnea    with exertion  . Sleep apnea    wears CPAP machine nightly    SURGICAL HISTORY: Past Surgical History:  Procedure Laterality Date  . BACK SURGERY    . BREAST CYST EXCISION Right 2001   negative  . BREAST LUMPECTOMY WITH NEEDLE LOCALIZATION AND AXILLARY LYMPH NODE DISSECTION Right 07/29/2016   Procedure: RIGHT BREAST LUMPECTOMY WITH DOUBLE NEEDLE LOCALIZATION AND COMPLETE RIGHT AXILLARY LYMPH NODE DISSECTION;  Surgeon: Tiffany Skates, MD;  Location: [place];  Service: General;  Laterality: Right;  . BREAST SURGERY Left 2000   Biopsy  . BUNIONECTOMY Bilateral 1998   great toe fusion on right foot  . COLONOSCOPY W/ POLYPECTOMY    . HERNIA REPAIR  [id number]   [place] SINUS SURGERY  2015  . REPLACEMENT TOTAL KNEE Bilateral 2007  . TOTAL SHOULDER REPLACEMENT Left 2007    SOCIAL HISTORY: Social History   Social History  . Marital status: Married    Spouse name: N/A  . Number of children: N/A  . Years of education: N/A   Occupational History  . Not on file.   Social History Main Topics  . Smoking status: Former Smoker    Packs/day: 2.00    Years: 25.00  . Smokeless tobacco: Never Used     Comment: quit 24 years ago  . Alcohol use Yes     Comment: rare  . Drug use: No  . Sexual activity: Not Currently   Other Topics Concern  . Not on file   Social History Narrative  . No narrative on file    FAMILY HISTORY: Family History  Problem Relation Age of Onset  . Arthritis Mother   . Stroke Mother   . Hypertension Mother   .  Cancer Father     Prostate  . Stroke Father   . Hypertension Father   . Hypertension Maternal Grandmother   . Rheum arthritis Maternal Grandfather   . Stroke Maternal Grandfather   . Hypertension Maternal Grandfather   . Cancer Paternal Grandmother     Colon  . Hypertension Paternal Grandmother   . Hypertension Paternal Grandfather   . Breast cancer Neg Hx     ALLERGIES:  is allergic to other.  MEDICATIONS:  Current Outpatient Prescriptions  Medication Sig Dispense Refill  . albuterol (PROAIR HFA) 108 (90 BASE) MCG/ACT inhaler Inhale  2 puffs into the lungs every 6 (six) hours as needed for wheezing.     Marland Kitchen albuterol-ipratropium (COMBIVENT) 18-103 MCG/ACT inhaler Inhale 2 puffs into the lungs every 4 (four) hours as needed for wheezing or shortness of breath.     . ALPRAZolam (XANAX) 0.25 MG tablet TAKE ONE TABLET BY MOUTH TWICE DAILY AS NEEDED (Patient taking differently: TAKE ONE TABLET BY MOUTH TWICE DAILY AS NEEDED FOR ANXIETY) 60 tablet 0  . aspirin EC 81 MG tablet Take 81 mg by mouth daily.    . Calcium Carbonate-Vitamin D (CALCIUM-VITAMIN D) 500-200 MG-UNIT tablet Take 1 tablet by mouth daily.    . cetirizine (ZYRTEC) 10 MG tablet Take 10 mg by mouth daily.    . Cholecalciferol (D3-1000 PO) Take 1 capsule by mouth every morning.    Marland Kitchen EPINEPHrine 0.3 mg/0.3 mL IJ SOAJ injection Inject 0.3 mg into the muscle once as needed (ALLERGIC REACTION).     Marland Kitchen glucosamine-chondroitin 500-400 MG tablet Take 1 tablet by mouth 2 (two) times daily.    Marland Kitchen HYDROcodone-acetaminophen (NORCO/VICODIN) 5-325 MG tablet Take 1 tablet by mouth every 8 (eight) hours as needed for moderate pain or severe pain. 30 tablet 0  . methocarbamol (ROBAXIN) 500 MG tablet Take 1 tablet (500 mg total) by mouth at bedtime as needed for muscle spasms. 10 tablet 0  . naproxen sodium (ALEVE) 220 MG tablet Take 440 mg by mouth 2 (two) times daily as needed (pain).     . Olmesartan-Amlodipine-HCTZ 40-5-25 MG TABS TAKE ONE  TABLET BY MOUTH ONCE DAILY 90 tablet 1  . Omega-3 Fatty Acids (FISH OIL) 1000 MG CAPS Take 1,000 mg by mouth daily.    Marland Kitchen oxybutynin (DITROPAN-XL) 10 MG 24 hr tablet Take 1 tablet (10 mg total) by mouth at bedtime. 30 tablet 5  . oxyCODONE (OXY IR/ROXICODONE) 5 MG immediate release tablet Take 1-2 tablets (5-10 mg total) by mouth every 4 (four) hours as needed for moderate pain. 30 tablet 0  . pravastatin (PRAVACHOL) 40 MG tablet TAKE ONE TABLET BY MOUTH ONCE DAILY 30 tablet 5  . ranitidine (ZANTAC) 150 MG tablet Take 150 mg by mouth daily.    . vitamin B-12 (CYANOCOBALAMIN) 1000 MCG tablet Take 1,000 mcg by mouth daily.     No current facility-administered medications for this visit.     REVIEW OF SYSTEMS:   Constitutional: Denies fevers, chills or abnormal night sweats Eyes: Denies blurriness of vision, double vision or watery eyes Ears, nose, mouth, throat, and face: Denies mucositis or sore throat Respiratory: Denies cough, dyspnea or wheezes Cardiovascular: Denies palpitation, chest discomfort or lower extremity swelling Gastrointestinal:  Denies nausea, heartburn or change in bowel habits Skin: Denies abnormal skin rashes Lymphatics: Denies new lymphadenopathy or easy bruising Neurological:Denies numbness, tingling or new weaknesses Behavioral/Psych: Mood is stable, no new changes  All other systems were reviewed with the patient and are negative.  PHYSICAL EXAMINATION: ECOG PERFORMANCE STATUS: 0 - Asymptomatic  Vitals:   08/21/16 1024  BP: 126/73  Pulse: 72  Resp: 18  Temp: 98.1 F (36.7 C)   Filed Weights   08/21/16 1024  Weight: 244 lb 8 oz (110.9 kg)    GENERAL:alert, no distress and comfortable SKIN: skin color, texture, turgor are normal, no rashes or significant lesions EYES: normal, conjunctiva are pink and non-injected, sclera clear OROPHARYNX:no exudate, no erythema and lips, buccal mucosa, and tongue normal  NECK: supple, thyroid normal size, non-tender,  without nodularity LYMPH:  no palpable lymphadenopathy in  the cervical, axillary or inguinal LUNGS: clear to auscultation and percussion with normal breathing effort HEART: regular rate & rhythm and no murmurs and no lower extremity edema ABDOMEN:abdomen soft, non-tender and normal bowel sounds Musculoskeletal:no cyanosis of digits and no clubbing  PSYCH: alert & oriented x 3 with fluent speech NEURO: no focal motor/sensory deficits Breasts: Breast inspection showed them to be symmetrical with no nipple discharge. (+) incision sites in right breast and axilla are healing well, one drainage tube in place. Palpation of the breasts and axilla revealed no obvious mass that I could appreciate.   LABORATORY DATA:  I have reviewed the data as listed CBC Latest Ref Rng & Units 07/29/2016 07/23/2016 04/02/2016  WBC 4.0 - 10.5 K/uL 11.9(H) 16.0(H) 8.3  Hemoglobin 12.0 - 15.0 g/dL 11.9(L) 14.3 13.7  Hematocrit 36.0 - 46.0 % 37.8 44.2 41.6  Platelets 150 - 400 K/uL 244 337 310.0   CMP Latest Ref Rng & Units 07/29/2016 07/23/2016 04/02/2016  Glucose 65 - 99 mg/dL - 129(H) 170(H)  BUN 6 - 20 mg/dL - 16 22  Creatinine 0.44 - 1.00 mg/dL 0.67 0.69 0.80  Sodium 135 - 145 mmol/L - 136 138  Potassium 3.5 - 5.1 mmol/L - 3.9 3.8  Chloride 101 - 111 mmol/L - 98(L) 100  CO2 22 - 32 mmol/L - 27 35(H)  Calcium 8.9 - 10.3 mg/dL - 10.4(H) 10.1  Total Protein 6.0 - 8.3 g/dL - - 7.1  Total Bilirubin 0.2 - 1.2 mg/dL - - 0.5  Alkaline Phos 39 - 117 U/L - - 68  AST 0 - 37 U/L - - 13  ALT 0 - 35 U/L - - 16    PATHOLOGY REPORT  Diagnosis 06/03/2016 Breast, right, needle core biopsy, ILQ focal 1.3 cm asymmetry/distortion - ATYPICAL DUCTAL HYPERPLASIA WITH CALCIFICATIONS. - FIBROCYSTIC CHANGES WITH CALCIFICATIONS. - SEE COMMENT. Microscopic Comment The results were called to [facility] on 06/04/16. (JBK:ds 06/04/16)   Diagnosis 06/09/2016 Breast, right, needle core biopsy, inner - INVASIVE DUCTAL  CARCINOMA. - DUCTAL CARCINOMA IN SITU. - SEE COMMENT. Microscopic Comment The carcinoma appears grade 1-2. A breast prognostic profile will be performed and the results reported separately. The results were called to [facility] on 06/10/2016. (JBK:ecj 06/10/2016) Results: HER2 - NEGATIVE RATIO OF HER2/CEP17 SIGNALS 1.55 AVERAGE HER2 COPY NUMBER PER CELL 2.25  Results: IMMUNOHISTOCHEMICAL AND MORPHOMETRIC ANALYSIS PERFORMED MANUALLY Estrogen Receptor: 100%, POSITIVE, STRONG STAINING INTENSITY Progesterone Receptor: 100%, POSITIVE, STRONG STAINING INTENSITY Proliferation Marker Ki67: 40%   Diagnosis 06/17/2016 Lymph node, needle/core biopsy, right, inferior, axilla to far lateral breast - METASTATIC CARCINOMA, SEE COMMENT. Microscopic Comment The morphology is consistent with the patient breast carcinoma. Prognostic markers will be ordered and reported in an addendum. The case was called to [facility] on 06/18/2016. Results: IMMUNOHISTOCHEMICAL AND MORPHOMETRIC ANALYSIS PERFORMED MANUALLY Estrogen Receptor: 100%, POSITIVE, STRONG STAINING INTENSITY Progesterone Receptor: 95%, POSITIVE, STRONG STAINING INTENSITY  Results: HER2 - NEGATIVE RATIO OF HER2/CEP17 SIGNALS 1.25 AVERAGE HER2 COPY NUMBER PER CELL 2.00  Diagnosis 07/29/2016 1. Breast, lumpectomy, Right INVASIVE DUCTAL CARCINOMA, GRADE 3, SPANNING 1.5 CM DUCTAL CARCINOMA IN SITU IS PRESENT ALL MARGINS OF RESECTION ARE NEGATIVE FOR CARCINOMA 2. Lymph nodes, regional resection, Right axillary contents METASTATIC BREAST DUCTAL CARCINOMA IN ONE OF SIXTEEN LYMPH NODES (1/16) Specimen, including laterality and lymph node sampling (sentinel, non-sentinel): Right partial breast and regional lymph nodes Procedure: Lumpectomy Histologic type: Ductal carcinoma Grade: 3 Tubule formation: 3 Nuclear  pleomorphism: 2 Mitotic:3 Tumor size (gross measurement or glass slide measurement): 1.5  cm Margins: Invasive, distance to closest margin: 0.7 cm In-situ, distance to closest margin: 0.7 cm If margin positive, focally or broadly: NA Lymphovascular invasion: Not identified Ductal carcinoma in situ: Present Grade: 3 Extensive intraductal component: moderate Lobular neoplasia: Negative Tumor focality: Focal Treatment effect: Negative If present, treatment effect in breast tissue, lymph nodes or both: NA Extent of tumor: Skin: Negative Nipple: Negative Skeletal muscle: Negative Lymph nodes: Examined: 0 Sentinel 16 Non-sentinel 16 Total Lymph nodes with metastasis: 1 Isolated tumor cells (< 0.2 mm): 0 Micrometastasis: (> 0.2 mm and < 2.0 mm): 0 Macrometastasis: (> 2.0 mm): 1 Extracapsular extension: Present Breast prognostic profile: Estrogen receptor: 100% Progesterone receptor: 100% Her 2 neu: Negative Ki-67: 40% Non-neoplastic breast: Unremarkable TNM: pT1c, pN1  RADIOGRAPHIC STUDIES: I have personally reviewed the radiological images as listed and agreed with the findings in the report. Chest 2 View  Result Date: 07/23/2016 CLINICAL DATA:  Preoperative examination prior right breast lumpectomy and lymph node dissection. History of hypertension, former smoker, asthma EXAM: CHEST  2 VIEW COMPARISON:  None in PACs FINDINGS: The lungs are mildly hyperinflated with hemidiaphragm flattening. There is no focal infiltrate. The interstitial markings are coarse in the right infrahilar region. The heart and pulmonary vascularity are normal. The mediastinum is normal in width. There is faint calcification in the wall of the aortic arch. There is multilevel degenerative disc disease of the thoracic spine with moderate dextrocurvature centered in the mid thoracic spine. There is a prosthetic left shoulder joint. IMPRESSION: No active cardiopulmonary disease. Aortic atherosclerosis. Electronically Signed   By: David  Martinique M.D.   On: 07/23/2016 16:13   Mm Breast Surgical  Specimen  Result Date: 07/29/2016 CLINICAL DATA:  Post right lumpectomy for right breast cancer and atypical ductal hyperplasia. EXAM: SPECIMEN RADIOGRAPH OF THE RIGHT BREAST COMPARISON:  Previous exam(s). FINDINGS: Status post excision of the right breast. The two wire tips and both coil shaped and X shaped biopsy marker clips are present and are marked for pathology. IMPRESSION: Specimen radiograph of the right breast. Electronically Signed   By: Everlean Alstrom M.D.   On: 07/29/2016 12:35   Mm Rt Plc Breast Loc Dev   1st Lesion  Inc Mammo Guide  Result Date: 07/29/2016 CLINICAL DATA:  Patient with biopsy-proven invasive cancer and biopsy-proven ADH within the right breast. Patient is scheduled for breast conservation surgery today requiring preoperative needle localizations. EXAM: NEEDLE LOCALIZATION OF THE RIGHT BREAST WITH MAMMO GUIDANCE COMPARISON:  Previous exams. FINDINGS: Patient presents for needle localization prior to lumpectomies. I met with the patient and we discussed the procedure of needle localization including benefits and alternatives. We discussed the high likelihood of a successful procedure. We discussed the risks of the procedure, including infection, bleeding, tissue injury, and further surgery. Informed, written consent was given. The usual time-out protocol was performed immediately prior to the procedure. Using mammographic guidance, sterile technique, 1% lidocaine and a 9 cm modified Kopans needle, the coil shaped clip within the lower inner quadrant of the right breast was localized using medial approach. Next, using mammographic guidance, sterile technique, 1% lidocaine and a 7 cm modified Kopan's needle, the X shaped clip within the lower inner quadrant of the right breast was localized using a medial approach. The images were marked for Dr. Dalbert Arnold. IMPRESSION: Needle localization right breast x2. No apparent complications. Electronically Signed   By: Franki Cabot M.D.   On:  07/29/2016 10:16   Mm Rt Plc Breast Loc Dev   Ea Add Lesion  Inc Mammo Guide  Result Date: 07/29/2016 CLINICAL DATA:  Patient with biopsy-proven invasive cancer and biopsy-proven ADH within the right breast. Patient is scheduled for breast conservation surgery today requiring preoperative needle localizations. EXAM: NEEDLE LOCALIZATION OF THE RIGHT BREAST WITH MAMMO GUIDANCE COMPARISON:  Previous exams. FINDINGS: Patient presents for needle localization prior to lumpectomies. I met with the patient and we discussed the procedure of needle localization including benefits and alternatives. We discussed the high likelihood of a successful procedure. We discussed the risks of the procedure, including infection, bleeding, tissue injury, and further surgery. Informed, written consent was given. The usual time-out protocol was performed immediately prior to the procedure. Using mammographic guidance, sterile technique, 1% lidocaine and a 9 cm modified Kopans needle, the coil shaped clip within the lower inner quadrant of the right breast was localized using medial approach. Next, using mammographic guidance, sterile technique, 1% lidocaine and a 7 cm modified Kopan's needle, the X shaped clip within the lower inner quadrant of the right breast was localized using a medial approach. The images were marked for Dr. Dalbert Arnold. IMPRESSION: Needle localization right breast x2. No apparent complications. Electronically Signed   By: Franki Cabot M.D.   On: 07/29/2016 10:16   Diagnostic mammogram and ultrasound of right breast including right axillary 05/23/2016 IMPRESSION: Suspicious architectural distortion within the lower inner quadrant of the right breast, measuring 1.3 cm greatest dimension, without sonographic correlate. This distortion could conceivably be related to the patient's earlier surgical excision biopsy perform in 2001 (patient states that this earlier surgical excision was in this same region of her  right breast), however, exclusion of a neoplastic cause is needed.  As such, stereotactic-guided biopsy, with 3D tomosynthesis, is recommended for this suspicious finding.   ASSESSMENT & PLAN: 75 year old Caucasian female, with mammogram discovered right breast cancer.  1. 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  -We reviewed her mammaprint genomic test result, which showed luminal type B, high risk, average 10-year risk of recurrence without adjuvant therapy is 29% -given the high risk disease, I strongly recommend her to consider adjuvant chemo. She is relatively elderly, with some medical comorbilities, but still has good PS, so I recommend adjuvant docetaxel and cytoxan every 3 weeks for 4 cycles   -the goal of chemo is curative  --Chemotherapy consent: Side effects including but does not not limited to, fatigue, nausea, vomiting, diarrhea, hair loss, neuropathy, fluid retention, renal and kidney dysfunction, neutropenic fever, needed for blood transfusion, bleeding, were discussed with patient in great detail. She agrees to proceed. -She would need postop breast and axilla radiation to reduce her risk of local recurrence, giving the node positive disease. -Given the strong ER/PR positivity, I strongly recommend adjuvant endocrine therapy with aromatase inhibitor. The benefit and side effects were discussed with her. She is interested. -We also discussed the breast cancer surveillance, after she completes surgery and radiation. She will have annual screening mammogram, routine office visit with exam and lab.   2. Hypertension and arthritis -She will continue medication and follow-up with her primary care physician  3.  Morbid obesity -I encouraged her to have healthy diet and exercise  regularly, try to lose some weight.  Plan -chemo class and lab on 9/13 -I will see her before her first adjuvant chemo TC on 9/18 -No need for port for now   All questions were answered. The patient knows to call the clinic with any problems, questions or concerns. I spent 25 minutes counseling the patient face to face. The total time spent in the appointment was 30 minutes and more than 50% was on counseling.     Tiffany Merle, MD 08/21/2016 7:25 AM

## 2016-08-23 ENCOUNTER — Other Ambulatory Visit: Payer: Self-pay | Admitting: Internal Medicine

## 2016-08-23 ENCOUNTER — Encounter: Payer: Self-pay | Admitting: Hematology

## 2016-08-23 MED ORDER — ONDANSETRON HCL 8 MG PO TABS: 8.0000 mg | ORAL_TABLET | Freq: Two times a day (BID) | ORAL | 1 refills | Status: DC | PRN

## 2016-08-23 MED ORDER — PROCHLORPERAZINE MALEATE 10 MG PO TABS: 10.0000 mg | ORAL_TABLET | Freq: Four times a day (QID) | ORAL | 1 refills | Status: DC | PRN

## 2016-08-24 ENCOUNTER — Telehealth: Payer: Self-pay | Admitting: Hematology

## 2016-08-24 NOTE — Telephone Encounter (Signed)
S/w pt, gave appts 9/13 @ 10am and 9/19 @ 9.45am.

## 2016-08-26 ENCOUNTER — Other Ambulatory Visit: Payer: Self-pay | Admitting: Internal Medicine

## 2016-08-26 NOTE — Telephone Encounter (Signed)
Last filled 07/15/16--please advise

## 2016-08-26 NOTE — Telephone Encounter (Signed)
Ok to phone in Xanax 

## 2016-08-26 NOTE — Telephone Encounter (Signed)
Rx called in to pharmacy. 

## 2016-08-27 ENCOUNTER — Telehealth: Payer: Self-pay

## 2016-08-27 NOTE — Telephone Encounter (Signed)
Mr Shirah [facility] signed) left v/m; pt had recent cancer surgery and is to start chemotherapy in couple of weeks. Mr Knee wants to know if pt should take flu shot and if so when. Mr Lande request cb.

## 2016-08-27 NOTE — Telephone Encounter (Signed)
She should discuss this with her oncologist.

## 2016-08-28 ENCOUNTER — Ambulatory Visit: Payer: Medicare PPO | Attending: General Surgery | Admitting: Physical Therapy

## 2016-08-28 DIAGNOSIS — R2231 Localized swelling, mass and lump, right upper limb: Secondary | ICD-10-CM

## 2016-08-28 DIAGNOSIS — M25611 Stiffness of right shoulder, not elsewhere classified: Secondary | ICD-10-CM | POA: Diagnosis not present

## 2016-08-28 DIAGNOSIS — M6281 Muscle weakness (generalized): Secondary | ICD-10-CM | POA: Insufficient documentation

## 2016-08-28 NOTE — Therapy (Signed)
[place], [place], [address] Phone: [phone]   Fax:  [phone]  Physical Therapy Evaluation  Patient Details  Name: Tiffany Arnold MRN: [id number] Date of Birth: 1941-05-31 Referring Provider: Dr. Dalbert Batman   Encounter Date: 08/28/2016      PT End of Session - 08/28/16 1714    Visit Number 1   Number of Visits 9   Date for PT Re-Evaluation 09/29/16   PT Start Time [id number]   PT Stop Time 1655   PT Time Calculation (min) 45 min   Activity Tolerance Patient tolerated treatment well   Behavior During Therapy [facility] for tasks assessed/performed      Past Medical History:  Diagnosis Date  . Allergy   . Arthritis   . Asthma   . Cataract of both eyes   . Chicken pox   . Colon polyps   . Dog bite of index finger 07/23/2016   Left index finger  . GERD (gastroesophageal reflux disease)   . Hyperlipidemia   . Hypertension   . IBS (irritable bowel syndrome)   . Phlebitis   . Pneumonia    hx of  . Shortness of breath dyspnea    with exertion  . Sleep apnea    wears CPAP machine nightly    Past Surgical History:  Procedure Laterality Date  . BACK SURGERY    . BREAST CYST EXCISION Right 2001   negative  . BREAST LUMPECTOMY WITH NEEDLE LOCALIZATION AND AXILLARY LYMPH NODE DISSECTION Right 07/29/2016   Procedure: RIGHT BREAST LUMPECTOMY WITH DOUBLE NEEDLE LOCALIZATION AND COMPLETE RIGHT AXILLARY LYMPH NODE DISSECTION;  Surgeon: Fanny Skates, MD;  Location: [place];  Service: General;  Laterality: Right;  . BREAST SURGERY Left 2000   Biopsy  . BUNIONECTOMY Bilateral 1998   great toe fusion on right foot  . COLONOSCOPY W/ POLYPECTOMY    . HERNIA REPAIR  [id number]   [place] SINUS SURGERY  2015  . REPLACEMENT TOTAL KNEE Bilateral 2007  . TOTAL SHOULDER REPLACEMENT Left 2007    There were no vitals filed for this visit.       Subjective Assessment - 08/28/16 1612    Subjective Pt  states "chemo is coming" and she will be having radiation.  She has some stinging all the way down the arm.  She had another incision in her armpit since they took 16 lymph nodes removed.,  Pt is very verbal.    Pertinent History Right breast lumpectomy with 16 lymph nodes removed on July 29, 2016. Has had bilateral knee replacements within the past 5 years. Had left shoulder replaced    Patient Stated Goals to get rid of the stinging in her arm    Currently in Pain? Yes   Pain Score 5    Pain Location Arm   Pain Orientation Right;Medial   Pain Descriptors / Indicators Burning   Pain Type Surgical pain   Pain Onset 1 to 4 weeks ago   Aggravating Factors  movement    Pain Relieving Factors not moving             [facility] PT Assessment - 08/28/16 0001      Assessment   Medical Diagnosis breast cancer   Referring Provider Dr. Dalbert Batman    Onset Date/Surgical Date 07/29/16   Hand Dominance Right     Precautions   Precautions Other (comment)   Precaution Comments previous      Restrictions  Weight Bearing Restrictions No     Balance Screen   Has the patient fallen in the past 6 months Yes   How many times? most recently tripped over that leg of a table  she falls a couple of times in past 6 monthsl    Has the patient had a decrease in activity level because of a fear of falling?  Yes  pt is very careful    Is the patient reluctant to leave their home because of a fear of falling?  Yes     [place] Private residence   Living Arrangements Spouse/significant other   Available Help at Discharge Family;Available 24 hours/day   Type of [place] entrance   Additional Comments husband is in early stages of Alzheimers.  She has help from her sister.      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  healing drain site at lateral chest    Quick DASH  84.09     Sensation   Light Touch Not tested     Coordination   Gross Motor Movements are Fluid and Coordinated No   Coordination and Movement Description difficulty with movment due to previous orthopedic problems      Posture/Postural Control   Posture/Postural Control Postural limitations   Postural Limitations Rounded Shoulders;Forward head;Decreased thoracic kyphosis     AROM   Overall AROM Comments decreased scapular movment on the right    Right Shoulder Flexion 100 Degrees   Right Shoulder ABduction 130 Degrees  painful    Right Shoulder External Rotation 70 Degrees   Left Shoulder Flexion 95 Degrees   Left Shoulder ABduction 70 Degrees   Left Shoulder External Rotation 80 Degrees     Strength   Overall Strength Deficits   Overall Strength Comments appears to have generalized decreased strength with disuse atrophy especially in right shoulder.    Right Shoulder Flexion 3-/5   Right Shoulder ABduction 2+/5   Right Shoulder External Rotation 2+/5   Left Shoulder Flexion 3-/5   Left Shoulder ABduction 3-/5   Left Shoulder External Rotation 3-/5           LYMPHEDEMA/ONCOLOGY QUESTIONNAIRE - 08/28/16 1643      What other symptoms do you have   Are you Having Heaviness or Tightness Yes   Are you having Pain Yes   Are you having pitting edema No   Is it Hard or Difficult finding clothes that fit No   Do you have infections No   [place] Sign No     Right Upper Extremity Lymphedema   15 cm Proximal to Olecranon Process 43 cm   10 cm Proximal to Olecranon Process 43 cm   Olecranon Process 31 cm   15 cm Proximal to Ulnar  Styloid Process 31 cm   10 cm Proximal to Ulnar Styloid Process 28.5 cm   Just Proximal to Ulnar Styloid Process 17 cm   Across Hand at [employer] 19.4 cm   At [place] of  2nd Digit 6.3 cm     Left Upper Extremity Lymphedema   15 cm Proximal to Olecranon Process 41 cm   10 cm Proximal to Olecranon Process 41 cm   Olecranon Process 28.5 cm   15 cm Proximal to Ulnar Styloid Process 27.5 cm   10 cm Proximal to Ulnar Styloid Process 26 cm   Just Proximal to Ulnar Styloid Process 16.5 cm   Across Hand at [employer] 19.4 cm   At [place] of 2nd Digit 6.3 cm           Quick Dash - 08/28/16 0001    Open a tight or new jar Unable   Do heavy household chores (wash walls, wash floors) Unable   Carry a shopping bag or briefcase Unable   Wash your back Unable   Use a knife to cut food Severe difficulty   Recreational activities in which you take some force or impact through your arm, shoulder, or hand (golf, hammering, tennis) Unable   During the past week, to what extent has your arm, shoulder or hand problem interfered with your normal social activities with family, friends, neighbors, or groups? Quite a bit   During the past week, to what extent has your arm, shoulder or hand problem limited your work or other regular daily activities Quite a bit   Arm, shoulder, or hand pain. Moderate   Tingling (pins and needles) in your arm, shoulder, or hand Extreme   Difficulty Sleeping Moderate difficulty   DASH Score 84.09 %                     PT Education - 08/28/16 1713    Education provided Yes   Education Details supine dowel rod flexion.  Wear Tg soft for comfort only.  do no allow it to roll.  Remove it if at all uncomfortable    Person(s) Educated Patient   Methods Demonstration;Explanation   Comprehension Verbalized understanding;Need further instruction                [facility] Goals - 08/28/16 1727      CC Long Term Goal  #1   Title Patient with  verbalize an understanding of lymphedema risk reduction precautions   Time 4   Period Weeks   Status New     CC Long Term Goal  #2   Title Patient will be independent in a home exercise program for range of motion and strength so that she can carry through at home    Time 4   Period Weeks   Status New     CC Long Term Goal  #3   Title Patient will report a decrease in pain by 50% so they can perform daily activities with greater ease   Time 4   Period Weeks   Status New     CC Long Term Goal  #4   Title Patient will decrease the DASH score to <60   to demonstrate increased functional use of upper extremity   Time 4   Period Weeks   Status New     CC Long Term Goal  #5   Title Patient will improve right  shoulder abduction to 120 degrees so that she can achieve position needed for radiation therapy.   Time 4   Period Weeks   Status New            Plan - 08/28/16 1717    Clinical Impression Statement  75 yo obese patient with previous bilateral total knee and right total shoulder patient has decreased shoulder range of motion and strength with increased circumfrential measurements of right arm compared to left (her left arm may be smaller due to disuse atrophy from previous shoulder impairment) She lives several miles away and husband has early stages of Alzheimers so that may impair her attendance. She will be receiving chemo and radiation so expect her situation to be evolving.    Rehab Potential Good   Clinical Impairments Affecting Rehab Potential previous total joint replacements in other joints with generalized weakness, obesity  caution of left total shoulder replacement    PT Frequency 2x / week   PT Duration 4 weeks   PT Treatment/Interventions ADLs/Self Care Home Management;Therapeutic activities;Therapeutic exercise;Taping;Manual lymph drainage;Neuromuscular re-education;DME Instruction;Patient/family education;Passive range of motion;Cognitive remediation   PT Next  Visit Plan Active , assested and passive right shoulder range of motion, active shoulder work esepcially with right scapular muscles. assess use of tg soft for pain managment    Consulted and Agree with Plan of Care Patient      Patient will benefit from skilled therapeutic intervention in order to improve the following deficits and impairments:  Decreased range of motion, Obesity, Impaired UE functional use, Decreased endurance, Decreased skin integrity, Pain, Impaired perceived functional ability, Decreased activity tolerance, Decreased knowledge of use of DME, Postural dysfunction, Decreased strength  Visit Diagnosis: Stiffness of right shoulder, not elsewhere classified - Plan: PT plan of care cert/re-cert  Localized swelling, mass and lump, right upper limb - Plan: PT plan of care cert/re-cert  Muscle weakness (generalized) - Plan: PT plan of care cert/re-cert      G-Codes - 0000000 1725    Functional Assessment Tool Used Quick DASH    Functional Limitation Carrying, moving and handling objects   Carrying, Moving and Handling Objects Current Status [id number]) At least 80 percent but less than 100 percent impaired, limited or restricted   Carrying, Moving and Handling Objects Goal Status [id number]) At least 60 percent but less than 80 percent impaired, limited or restricted       Problem List Patient Active Problem List   Diagnosis Date Noted  . Breast cancer of lower-inner quadrant of right female breast ([place]) 06/27/2016  . OSA on CPAP 06/26/2016  . Type 2 diabetes mellitus without complication ([place]) [id number]  . Severe obesity (BMI >= 40) ([place]) 09/04/2014  . Essential hypertension 09/04/2014  . HLD (hyperlipidemia) 09/04/2014  . Gastroesophageal reflux disease without esophagitis 09/04/2014  . Asthma, mild persistent 09/04/2014  . Urge incontinence 09/04/2014  . Seasonal allergies 09/04/2014  . Generalized anxiety disorder 09/04/2014   Donato Heinz. Owens Shark PT   Norwood Levo 08/28/2016, 5:31 PM  [place], [place], [address] Phone: [phone]   Fax:  [phone]  Name: Tiffany Arnold MRN: [id number] Date of Birth: Oct 16, 1941

## 2016-08-28 NOTE — Patient Instructions (Signed)
Cane Overhead - Supine  Hold cane at thighs with both hands, extend arms straight over head. Hold _3-5 __ seconds. Repeat _5__ times. Do _3_ times per day.

## 2016-09-02 ENCOUNTER — Ambulatory Visit: Payer: Medicare PPO

## 2016-09-02 ENCOUNTER — Other Ambulatory Visit: Payer: Self-pay | Admitting: General Surgery

## 2016-09-02 ENCOUNTER — Telehealth: Payer: Self-pay | Admitting: *Deleted

## 2016-09-02 DIAGNOSIS — R2231 Localized swelling, mass and lump, right upper limb: Secondary | ICD-10-CM

## 2016-09-02 DIAGNOSIS — M6281 Muscle weakness (generalized): Secondary | ICD-10-CM

## 2016-09-02 DIAGNOSIS — M25611 Stiffness of right shoulder, not elsewhere classified: Secondary | ICD-10-CM

## 2016-09-02 NOTE — Therapy (Signed)
[place], [place], [address] Phone: [phone]   Fax:  [phone]  Physical Therapy Treatment  Patient Details  Name: Tiffany Arnold MRN: [id number] Date of Birth: 06/30/1941 Referring Provider: Dr. Dalbert Batman   Encounter Date: 09/02/2016      PT End of Session - 09/02/16 1148    Visit Number 2   Number of Visits 9   Date for PT Re-Evaluation 09/29/16   PT Start Time [id number]   PT Stop Time 0937   PT Time Calculation (min) 42 min   Activity Tolerance Patient tolerated treatment well   Behavior During Therapy [facility] for tasks assessed/performed      Past Medical History:  Diagnosis Date  . Allergy   . Arthritis   . Asthma   . Cataract of both eyes   . Chicken pox   . Colon polyps   . Dog bite of index finger 07/23/2016   Left index finger  . GERD (gastroesophageal reflux disease)   . Hyperlipidemia   . Hypertension   . IBS (irritable bowel syndrome)   . Phlebitis   . Pneumonia    hx of  . Shortness of breath dyspnea    with exertion  . Sleep apnea    wears CPAP machine nightly    Past Surgical History:  Procedure Laterality Date  . BACK SURGERY    . BREAST CYST EXCISION Right 2001   negative  . BREAST LUMPECTOMY WITH NEEDLE LOCALIZATION AND AXILLARY LYMPH NODE DISSECTION Right 07/29/2016   Procedure: RIGHT BREAST LUMPECTOMY WITH DOUBLE NEEDLE LOCALIZATION AND COMPLETE RIGHT AXILLARY LYMPH NODE DISSECTION;  Surgeon: Fanny Skates, MD;  Location: [place];  Service: General;  Laterality: Right;  . BREAST SURGERY Left 2000   Biopsy  . BUNIONECTOMY Bilateral 1998   great toe fusion on right foot  . COLONOSCOPY W/ POLYPECTOMY    . HERNIA REPAIR  [id number]   [place] SINUS SURGERY  2015  . REPLACEMENT TOTAL KNEE Bilateral 2007  . TOTAL SHOULDER REPLACEMENT Left 2007    There were no vitals filed for this visit.      Subjective Assessment - 09/02/16 0857    Subjective I wore  the sleeve she gave me last time for the whole day and it felt great but I had trouble getting it back on.    Pertinent History Right breast lumpectomy with 16 lymph nodes removed on July 29, 2016. Has had bilateral knee replacements within the past 5 years. Had left shoulder replaced    Patient Stated Goals to get rid of the stinging in her arm    Currently in Pain? Yes   Pain Score 4    Pain Location Shoulder   Pain Orientation Right;Left   Pain Descriptors / Indicators Aching;Other (Comment)  Stinging   Pain Type Surgical pain   Pain Onset 1 to 4 weeks ago   Pain Frequency Intermittent   Aggravating Factors  certain movements   Pain Relieving Factors rest                         [facility] Adult PT Treatment/Exercise - 09/02/16 0001      Shoulder Exercises: Seated   Flexion AAROM;Right;5 reps  With cane   Flexion Limitations Pt had difficulty with this and mostly unable to perform without scapular compensation so did not add this to HEP today and instructed pt to cont in supine.   Abduction  AAROM;Right;5 reps  With cane   ABduction Limitations Tactile cuing for correct position of Rt UE on cane and to relax same UE as much as possible to allow Lt UE to do more of the work . This was also challenging for pt so issued handout how to do in supine.      Shoulder Exercises: Pulleys   Flexion 2 minutes   ABduction 2 minutes     Shoulder Exercises: Therapy Ball   Flexion 10 reps   Flexion Limitations Moderate tactile and VC to decrease Rt scapular compensations. Pt reported some pain with this but felt okay to cont.      Manual Therapy   Myofascial Release To Rt axilla during P/ROM and UE pulling into abduction to tolerance   Passive ROM In Supine to Rt shoulder into flexion, abduction and er to pts tolerance                PT Education - 09/02/16 0937    Education provided Yes   Education Details Supine cane abduction as well as continuing flexion    Person(s) Educated Patient   Methods Explanation;Demonstration;Handout   Comprehension Verbalized understanding;Need further instruction                [facility] Goals - 08/28/16 1727      CC Long Term Goal  #1   Title Patient with verbalize an understanding of lymphedema risk reduction precautions   Time 4   Period Weeks   Status New     CC Long Term Goal  #2   Title Patient will be independent in a home exercise program for range of motion and strength so that she can carry through at home    Time 4   Period Weeks   Status New     CC Long Term Goal  #3   Title Patient will report a decrease in pain by 50% so they can perform daily activities with greater ease   Time 4   Period Weeks   Status New     CC Long Term Goal  #4   Title Patient will decrease the DASH score to <60   to demonstrate increased functional use of upper extremity   Time 4   Period Weeks   Status New     CC Long Term Goal  #5   Title Patient will improve right  shoulder abduction to 120 degrees so that she can achieve position needed for radiation therapy.   Time 4   Period Weeks   Status New            Plan - 09/02/16 1149    Clinical Impression Statement Pt tolerated first treatment very well today reporting feeling good and looser after session. Her P/ROM increased greatly throughout session and she did well with AA/ROM stretching with pulleys and ball on wall, though required moderate VC throughout to decrease Rt scapular compensation which she streuggled with. Pt had trouble with performing seated cane AA/ROM exercises with correct technique so did issue these to HEP today. Pt reported TG soft felt good but had trouble getiting it back on later so therapist donned for pt today with further instruction in how to at home.    Rehab Potential Good   Clinical Impairments Affecting Rehab Potential previous total joint replacements in other joints with generalized weakness, obesity;  caution of left total shoulder replacement    PT Frequency 2x / week   PT Duration 4  weeks   PT Treatment/Interventions ADLs/Self Care Home Management;Therapeutic activities;Therapeutic exercise;Taping;Manual lymph drainage;Neuromuscular re-education;DME Instruction;Patient/family education;Passive range of motion;Cognitive remediation   PT Next Visit Plan Measure A/ROM for goal assess next visit. Cont A/AA/P/ROM of Rt shoulder ROM, active shoulder work esepcially with right scapular muscles.    Recommended Other Services Pt plans to attend ABC class      Patient will benefit from skilled therapeutic intervention in order to improve the following deficits and impairments:  Decreased range of motion, Obesity, Impaired UE functional use, Decreased endurance, Decreased skin integrity, Pain, Impaired perceived functional ability, Decreased activity tolerance, Decreased knowledge of use of DME, Postural dysfunction, Decreased strength  Visit Diagnosis: Stiffness of right shoulder, not elsewhere classified  Localized swelling, mass and lump, right upper limb  Muscle weakness (generalized)     Problem List Patient Active Problem List   Diagnosis Date Noted  . Breast cancer of lower-inner quadrant of right female breast ([place]) 06/27/2016  . OSA on CPAP 06/26/2016  . Type 2 diabetes mellitus without complication ([place]) [id number]  . Severe obesity (BMI >= 40) ([place]) 09/04/2014  . Essential hypertension 09/04/2014  . HLD (hyperlipidemia) 09/04/2014  . Gastroesophageal reflux disease without esophagitis 09/04/2014  . Asthma, mild persistent 09/04/2014  . Urge incontinence 09/04/2014  . Seasonal allergies 09/04/2014  . Generalized anxiety disorder 09/04/2014    Otelia Limes, PTA 09/02/2016, 11:57 AM  [place], [place], [address] Phone: [phone]   Fax:  [phone]  Name: Tiffany Arnold MRN:  [id number] Date of Birth: 04-21-1941

## 2016-09-02 NOTE — Telephone Encounter (Signed)
Called pt and discussed port placement. Pt would like to have port placed. Msg sent to Drs. Burr Medico and Dalbert Batman. Informed pt she will hear from Dr. Darrel Hoover office with sx date for port. Denies further needs at this time

## 2016-09-02 NOTE — Patient Instructions (Signed)
Cane Exercise: Abduction / Adduction    Hold cane palms down. Keeping back flat, move cane side to side over chest. Hold __5__ seconds each side. Repeat _5-10___ times. Do _2-3___ sessions per day.  http://gt2.exer.us/90   Copyright  VHI. All rights reserved.   Cancer Rehab [phone]

## 2016-09-02 NOTE — Telephone Encounter (Signed)
Pt is aware.  

## 2016-09-03 ENCOUNTER — Other Ambulatory Visit (HOSPITAL_BASED_OUTPATIENT_CLINIC_OR_DEPARTMENT_OTHER): Payer: Medicare PPO

## 2016-09-03 ENCOUNTER — Other Ambulatory Visit: Payer: Self-pay | Admitting: Hematology

## 2016-09-03 ENCOUNTER — Other Ambulatory Visit: Payer: Medicare PPO

## 2016-09-03 ENCOUNTER — Encounter: Payer: Self-pay | Admitting: *Deleted

## 2016-09-03 DIAGNOSIS — C50311 Malignant neoplasm of lower-inner quadrant of right female breast: Secondary | ICD-10-CM

## 2016-09-03 LAB — CBC WITH DIFFERENTIAL/PLATELET
BASO%: 0.7 % (ref 0.0–2.0)
Basophils Absolute: 0.1 10*3/uL (ref 0.0–0.1)
EOS%: 3.3 % (ref 0.0–7.0)
Eosinophils Absolute: 0.2 10*3/uL (ref 0.0–0.5)
HCT: 42 % (ref 34.8–46.6)
HGB: 13.8 g/dL (ref 11.6–15.9)
LYMPH%: 46.4 % (ref 14.0–49.7)
MCH: 29.1 pg (ref 25.1–34.0)
MCHC: 32.9 g/dL (ref 31.5–36.0)
MCV: 88.3 fL (ref 79.5–101.0)
MONO#: 0.6 10*3/uL (ref 0.1–0.9)
MONO%: 8.5 % (ref 0.0–14.0)
NEUT#: 3 10*3/uL (ref 1.5–6.5)
NEUT%: 41.1 % (ref 38.4–76.8)
Platelets: 263 10*3/uL (ref 145–400)
RBC: 4.75 10*6/uL (ref 3.70–5.45)
RDW: 13.3 % (ref 11.2–14.5)
WBC: 7.4 10*3/uL (ref 3.9–10.3)
lymph#: 3.4 10*3/uL — ABNORMAL HIGH (ref 0.9–3.3)

## 2016-09-03 LAB — COMPREHENSIVE METABOLIC PANEL
ALK PHOS: 74 U/L (ref 40–150)
ALT: 16 U/L (ref 0–55)
AST: 15 U/L (ref 5–34)
Albumin: 3.4 g/dL — ABNORMAL LOW (ref 3.5–5.0)
Anion Gap: 7 mEq/L (ref 3–11)
BILIRUBIN TOTAL: 0.4 mg/dL (ref 0.20–1.20)
BUN: 22.2 mg/dL (ref 7.0–26.0)
CHLORIDE: 101 meq/L (ref 98–109)
CO2: 31 meq/L — AB (ref 22–29)
Calcium: 10.5 mg/dL — ABNORMAL HIGH (ref 8.4–10.4)
Creatinine: 0.8 mg/dL (ref 0.6–1.1)
EGFR: 79 mL/min/{1.73_m2} — ABNORMAL LOW (ref >90)
Glucose: 113 mg/dl (ref 70–140)
Potassium: 4.4 mEq/L (ref 3.5–5.1)
SODIUM: 140 meq/L (ref 136–145)
Total Protein: 7.2 g/dL (ref 6.4–8.3)

## 2016-09-03 NOTE — Patient Instructions (Addendum)
Tiffany Arnold  09/03/2016   Your procedure is scheduled on: 09-05-16  Report to [facility] Main  Entrance take [facility]  elevators to 3rd floor to  [place] at 530 AM.  Call this number if you have problems the morning of surgery [phone]   Remember: ONLY 1 PERSON MAY GO WITH YOU TO SHORT STAY TO GET  READY MORNING OF [place].  Do not eat food or drink liquids :After Midnight.  Bring cpap mask and tubing only   Take these medicines the morning of surgery with A SIP OF WATER: albuterol inhaler if needed and bring inhaler, combivent inhaler if needed and bring with you, alprazolam (xanax) if needed, certrizine (zyrtec)                               You may not have any metal on your body including hair pins and              piercings  Do not wear jewelry, make-up, lotions, powders or perfumes, deodorant             Do not wear nail polish.  Do not shave  48 hours prior to surgery.              Men may shave face and neck.   Do not bring valuables to the hospital. [place].  Contacts, dentures or bridgework may not be worn into surgery.  Leave suitcase in the car. After surgery it may be brought to your room.     Patients discharged the day of surgery will not be allowed to drive home.  Name and phone number of your driver:Demir T[place]worth [place] sister cell [phone]  Special Instructions: N/A              Please read over the following fact sheets you were given: _____________________________________________________________________             [facility] - Preparing for Surgery Before surgery, you can play an important role.  Because skin is not sterile, your skin needs to be as free of germs as possible.  You can reduce the number of germs on your skin by washing with CHG (chlorahexidine gluconate) soap before surgery.  CHG is an antiseptic cleaner which kills germs and bonds with the skin to  continue killing germs even after washing. Please DO NOT use if you have an allergy to CHG or antibacterial soaps.  If your skin becomes reddened/irritated stop using the CHG and inform your nurse when you arrive at Short Stay. Do not shave (including legs and underarms) for at least 48 hours prior to the first CHG shower.  You may shave your face/neck. Please follow these instructions carefully:  1.  Shower with CHG Soap the night before surgery and the  morning of Surgery.  2.  If you choose to wash your hair, wash your hair first as usual with your  normal  shampoo.  3.  After you shampoo, rinse your hair and body thoroughly to remove the  shampoo.                           4.  Use CHG as you  would any other liquid soap.  You can apply chg directly  to the skin and wash                       Gently with a scrungie or clean washcloth.  5.  Apply the CHG Soap to your body ONLY FROM THE NECK DOWN.   Do not use on face/ open                           Wound or open sores. Avoid contact with eyes, ears mouth and genitals (private parts).                       Wash face,  Genitals (private parts) with your normal soap.             6.  Wash thoroughly, paying special attention to the area where your surgery  will be performed.  7.  Thoroughly rinse your body with warm water from the neck down.  8.  DO NOT shower/wash with your normal soap after using and rinsing off  the CHG Soap.                9.  Pat yourself dry with a clean towel.            10.  Wear clean pajamas.            11.  Place clean sheets on your bed the night of your first shower and do not  sleep with pets. Day of Surgery : Do not apply any lotions/deodorants the morning of surgery.  Please wear clean clothes to the hospital/surgery center.  FAILURE TO FOLLOW THESE INSTRUCTIONS MAY RESULT IN THE CANCELLATION OF YOUR SURGERY PATIENT SIGNATURE_________________________________  NURSE  SIGNATURE__________________________________  ________________________________________________________________________

## 2016-09-03 NOTE — Progress Notes (Signed)
Chest xray and ekg both done 07-23-16 epic

## 2016-09-03 NOTE — H&P (Signed)
Tiffany Arnold Location: [facility] Patient #: [id number] DOB: 03-26-1941 Married / Language: English / Race: White Female       History of Present Illness  The patient is a 75 year old female who presents with breast cancer. This is a very pleasant 75 year old female who returns for a postop visit following breast conservation surgery for right breast cancer. She lives in [place]. Her PCP is Dr. Bjorn Loser. Dr. Burr Medico is her oncologist.  On July 29, 2016 she underwent right breast lumpectomy with double wire needle localization and complete right axillary lymph node dissection. 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 Negative margins. 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 Receptor positive. HER-2 negative. She has done well from surgery. Drain is out. She's had trouble getting a physical therapy appointment in [place] wants to do this in [place] and we will set that up.  She has discussed management with Dr. Burr Medico.  The Mammaprint score must be high enough and Dr. Burr Medico plans four cycles of chemotherapy .  After thinking about this for some time, the patient and Dr. Burr Medico have requested that I insert a port.  We are going to go ahead and schedule that surgery. She knows that she will probably get radiation therapy after that and then anti-hormone pills.  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   Family history is negative.   She works as a Air traffic controller.  Her wounds are healing just fine. She will be scheduled for right shoulder range of motion exercises with [facility] physical therapy.   Allergies  No Known Drug Allergies06/27/2017  Medication History  ALPRAZolam (0.25MG Tablet, Oral) Active. Olmesartan-Amlodipine-HCTZ (40-5-25MG Tablet, Oral) Active. Oxybutynin Chloride ER (10MG  Tablet ER 24HR, Oral) Active. Pravastatin Sodium (40MG Tablet, Oral) Active. Aspirin (81MG Tablet, Oral) Active. Cetirizine HCl (10MG Tablet, Oral) Active. Vitamin D3 (1000UNIT Capsule, Oral) Active. Glucosamine Chondroitin Complx (Oral) Active. EPINEPHrine (0.3MG/0.3ML Soln Auto-inj, Injection) Active. Omeprazole (40MG Capsule DR, Oral) Active. RaNITidine HCl (150MG Capsule, Oral) Active. Vitamin B12 (1000MCG Tablet ER, Oral) Active. CloNIDine HCl (0.1MG Tablet, Oral) Active. Oxybutynin Chloride (10MG Tablet ER, Oral) Active. Ondansetron HCl (8MG Tablet, Oral) Active. Medications Reconciled  Vitals  Weight: 243.4 lb Height: 61in Body Surface Area: 2.05 m Body Mass Index: 45.99 kg/m  Temp.: 97.44F  Pulse: 68 (Regular)  BP: 142/90 (Sitting, Left Arm, Standard)     Physical Exam General Note: Very pleasant. Alert. Talkative. No distress  Neck: No adenopathy or mass.  Supple without tenderness Lungs: Clear to auscultation bilaterally Heart: Regular rate and rhythm.  No murmur.  No ectopy  Breast Note: Right breast reveals well-healed scar lower inner quadrant and well-healed scar in the axilla. There may be a tiny seroma in the right axilla is hard to tell. There is no infection. No hematoma. No tenderness. No arm swelling. She does have some numbness in the upper inner arm as expected. Range of motion right shoulder is about 150 abduction.     Assessment & Plan [facility] M. Dalbert Batman MD; 08/26/2016 11:21 AM) INVASIVE DUCTAL CARCINOMA OF BREAST, RIGHT (C50.911) .  You are recovering from your right breast lumpectomy and right axillary symptoms noted biopsies without any significant complication The drains are out and the wounds look fine The final pathology shows a invasive cancer in the right breast lower inner quadrant. Margins are negative and you do not  need any further surgery. There was cancer in 1 out of 16 lymph nodes. You have seen Dr. Feng  who plans chemotherapy  you will be referred to physical therapy here in [place] for right shoulder range of motion exercises  I agree with Dr. Feng decision to give you chemotherapy Dr. Feng and you have requested Port-A-Cath insertion, and I will arrange that surgery  Otherwise , return to see Dr.  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 , M.D., FACS [facility] General and Minimally invasive Surgery Breast and Colorectal Surgery Office:   [phone] Pager:   [phone]   

## 2016-09-04 ENCOUNTER — Encounter (HOSPITAL_COMMUNITY): Payer: Self-pay

## 2016-09-04 ENCOUNTER — Encounter (HOSPITAL_COMMUNITY)
Admission: RE | Admit: 2016-09-04 | Discharge: 2016-09-04 | Disposition: A | Discharge status: Home / Self Care | Payer: Medicare PPO | Source: Ambulatory Visit | Attending: General Surgery | Admitting: General Surgery

## 2016-09-04 ENCOUNTER — Ambulatory Visit: Payer: Medicare PPO

## 2016-09-04 ENCOUNTER — Other Ambulatory Visit: Payer: Self-pay | Admitting: *Deleted

## 2016-09-04 DIAGNOSIS — M25611 Stiffness of right shoulder, not elsewhere classified: Secondary | ICD-10-CM

## 2016-09-04 DIAGNOSIS — C50311 Malignant neoplasm of lower-inner quadrant of right female breast: Secondary | ICD-10-CM | POA: Diagnosis not present

## 2016-09-04 DIAGNOSIS — R2231 Localized swelling, mass and lump, right upper limb: Secondary | ICD-10-CM

## 2016-09-04 DIAGNOSIS — E669 Obesity, unspecified: Secondary | ICD-10-CM | POA: Diagnosis not present

## 2016-09-04 DIAGNOSIS — K219 Gastro-esophageal reflux disease without esophagitis: Secondary | ICD-10-CM | POA: Diagnosis not present

## 2016-09-04 DIAGNOSIS — C773 Secondary and unspecified malignant neoplasm of axilla and upper limb lymph nodes: Secondary | ICD-10-CM | POA: Diagnosis not present

## 2016-09-04 DIAGNOSIS — J45909 Unspecified asthma, uncomplicated: Secondary | ICD-10-CM | POA: Diagnosis not present

## 2016-09-04 DIAGNOSIS — Z6841 Body Mass Index (BMI) 40.0 and over, adult: Secondary | ICD-10-CM | POA: Diagnosis not present

## 2016-09-04 DIAGNOSIS — F419 Anxiety disorder, unspecified: Secondary | ICD-10-CM | POA: Diagnosis not present

## 2016-09-04 DIAGNOSIS — Z96653 Presence of artificial knee joint, bilateral: Secondary | ICD-10-CM | POA: Diagnosis not present

## 2016-09-04 DIAGNOSIS — Z79899 Other long term (current) drug therapy: Secondary | ICD-10-CM | POA: Diagnosis not present

## 2016-09-04 DIAGNOSIS — Z170 Estrogen receptor positive status [ER+]: Secondary | ICD-10-CM | POA: Diagnosis not present

## 2016-09-04 DIAGNOSIS — M6281 Muscle weakness (generalized): Secondary | ICD-10-CM

## 2016-09-04 DIAGNOSIS — E119 Type 2 diabetes mellitus without complications: Secondary | ICD-10-CM | POA: Diagnosis not present

## 2016-09-04 DIAGNOSIS — Z87891 Personal history of nicotine dependence: Secondary | ICD-10-CM | POA: Diagnosis not present

## 2016-09-04 DIAGNOSIS — Z7982 Long term (current) use of aspirin: Secondary | ICD-10-CM | POA: Diagnosis not present

## 2016-09-04 DIAGNOSIS — I10 Essential (primary) hypertension: Secondary | ICD-10-CM | POA: Diagnosis not present

## 2016-09-04 DIAGNOSIS — G4730 Sleep apnea, unspecified: Secondary | ICD-10-CM | POA: Diagnosis not present

## 2016-09-04 MED ORDER — LIDOCAINE-PRILOCAINE 2.5-2.5 % EX CREA: TOPICAL_CREAM | CUTANEOUS | 1 refills | Status: DC

## 2016-09-04 NOTE — Anesthesia Preprocedure Evaluation (Signed)
Anesthesia Evaluation  Patient identified by MRN, date of birth, ID band Patient awake    Reviewed: Allergy & Precautions, NPO status , Patient's Chart, lab work & pertinent test results  History of Anesthesia Complications (+) history of anesthetic complications  Airway Mallampati: II  TM Distance: >3 FB Neck ROM: Full    Dental no notable dental hx. (+) Dental Advisory Given, Caps   Pulmonary asthma , sleep apnea and Continuous Positive Airway Pressure Ventilation , former smoker,    Pulmonary exam normal breath sounds clear to auscultation       Cardiovascular hypertension, Normal cardiovascular exam Rhythm:Regular Rate:Normal     Neuro/Psych PSYCHIATRIC DISORDERS Anxiety negative neurological ROS     GI/Hepatic Neg liver ROS, GERD  Controlled,  Endo/Other  diabetesMorbid obesity  Renal/GU negative Renal ROS  negative genitourinary   Musculoskeletal  (+) Arthritis ,   Abdominal   Peds negative pediatric ROS (+)  Hematology negative hematology ROS (+)   Anesthesia Other Findings Breast ca  Reproductive/Obstetrics negative OB ROS                             Anesthesia Physical Anesthesia Plan  ASA: III  Anesthesia Plan: General   Post-op Pain Management:    Induction: Intravenous  Airway Management Planned: LMA  Additional Equipment:   Intra-op Plan:   Post-operative Plan: Extubation in OR  Informed Consent: I have reviewed the patients History and Physical, chart, labs and discussed the procedure including the risks, benefits and alternatives for the proposed anesthesia with the patient or authorized representative who has indicated his/her understanding and acceptance.   Dental advisory given  Plan Discussed with: CRNA  Anesthesia Plan Comments:         Anesthesia Quick Evaluation

## 2016-09-04 NOTE — Progress Notes (Signed)
Cbc with dif, cmet 08-03-16 epic

## 2016-09-04 NOTE — Therapy (Signed)
[place], [place], [address] Phone: [phone]   Fax:  [phone]  Physical Therapy Treatment  Patient Details  Name: JUNICE NEALLY MRN: [id number] Date of Birth: Oct 30, 1941 Referring Provider: Dr. Dalbert Batman   Encounter Date: 09/04/2016      PT End of Session - 09/04/16 1436    Visit Number 3   Number of Visits 9   Date for PT Re-Evaluation 09/29/16   PT Start Time [id number]   PT Stop Time 1435   PT Time Calculation (min) 46 min   Activity Tolerance Patient tolerated treatment well   Behavior During Therapy [facility] for tasks assessed/performed      Past Medical History:  Diagnosis Date  . Allergy   . Arthritis   . Asthma   . Cancer ([place]) 07/29/2016   right breast  . Cataract of both eyes   . Chicken pox   . Colon polyps   . Dog bite of index finger 07/23/2016   Left index finger  . GERD (gastroesophageal reflux disease)   . Hyperlipidemia   . Hypertension   . IBS (irritable bowel syndrome)   . Phlebitis   . Pneumonia    hx of several yrs ago  . Shortness of breath dyspnea    with exertion only  . Sleep apnea    wears CPAP machine nightly    Past Surgical History:  Procedure Laterality Date  . BACK SURGERY     lower  . BREAST CYST EXCISION Right 2001   negative  . BREAST LUMPECTOMY WITH NEEDLE LOCALIZATION AND AXILLARY LYMPH NODE DISSECTION Right 07/29/2016   Procedure: RIGHT BREAST LUMPECTOMY WITH DOUBLE NEEDLE LOCALIZATION AND COMPLETE RIGHT AXILLARY LYMPH NODE DISSECTION;  Surgeon: Fanny Skates, MD;  Location: [place];  Service: General;  Laterality: Right;  . BREAST SURGERY Left 2000   Biopsy  . BUNIONECTOMY Bilateral 1998   great toe fusion on right foot  . COLONOSCOPY W/ POLYPECTOMY    . HERNIA REPAIR  [id number]   [place] SINUS SURGERY  2015  . REPLACEMENT TOTAL KNEE Bilateral 2007  . TOTAL SHOULDER REPLACEMENT Left 2007    There were no vitals filed for this  visit.      Subjective Assessment - 09/04/16 1410    Subjective I'm sore fro last visit but I know it's just because I haven't moved like that in awhile. Not hurting necessarily today in my Rt sholder, just sore.    Pertinent History Right breast lumpectomy with 16 lymph nodes removed on July 29, 2016. Has had bilateral knee replacements within the past 5 years. Had left shoulder replaced    Patient Stated Goals to get rid of the stinging in her arm    Currently in Pain? No/denies            [facility] PT Assessment - 09/04/16 0001      AROM   Right Shoulder Flexion 114 Degrees  After stretching   Right Shoulder ABduction 114 Degrees   Left Shoulder Flexion 120 Degrees   Left Shoulder ABduction 85 Degrees     PROM   Right Shoulder Flexion 130 Degrees   Right Shoulder ABduction 165 Degrees                     [facility] Adult PT Treatment/Exercise - 09/04/16 0001      Shoulder Exercises: Pulleys   Flexion 3 minutes   Flexion Limitations VC to decrease scapular compensations  ABduction 3 minutes   ABduction Limitations Tactile and VC to decrease scapular compensations.     Shoulder Exercises: Therapy Ball   Flexion 10 reps     Shoulder Exercises: ROM/Strengthening   Other ROM/Strengthening Exercises Finger Ladder Rt UE abduction up to 5 times (up to 20) with tactile cuing to decrease scapular compensations   Other ROM/Strengthening Exercises Neural tension stretch on wall for Rt UE with tactile cuing for correct UE position 3 times, 5 second holds     Manual Therapy   Myofascial Release To Rt axilla during P/ROM and UE pulling into abduction to tolerance   Passive ROM In Supine to Rt shoulder into flexion, abduction and er to pts tolerance   Neural Stretch To Rt UE to tolerance                        Long Term Clinic Goals - 09/04/16 1423      CC Long Term Goal  #1   Title Patient with verbalize an understanding of lymphedema risk reduction  precautions   Status On-going     CC Long Term Goal  #2   Title Patient will be independent in a home exercise program for range of motion and strength so that she can carry through at home    Status On-going     CC Long Term Goal  #3   Title Patient will report a decrease in pain by 50% so they can perform daily activities with greater ease   Baseline no change yet 09/04/16   Status On-going     CC Long Term Goal  #4   Title Patient will decrease the DASH score to <60   to demonstrate increased functional use of upper extremity   Status On-going     CC Long Term Goal  #5   Title Patient will improve right  shoulder abduction to 120 degrees so that she can achieve position needed for radiation therapy.   Baseline 114 degrees 09/04/16   Status On-going            Plan - 09/04/16 1437    Clinical Impression Statement Pt did well with session today, her A/ROM has improved since last visit showing good progress already towards her goals. She reports is doing her HEP at home and her P/ROM ended up being full by end of manual stretching today. Pt required multiple seated rest breaks during session today.    Rehab Potential Good   Clinical Impairments Affecting Rehab Potential previous total joint replacements in other joints with generalized weakness, obesity; caution of left total shoulder replacement    PT Frequency 2x / week   PT Duration 4 weeks   PT Treatment/Interventions ADLs/Self Care Home Management;Therapeutic activities;Therapeutic exercise;Taping;Manual lymph drainage;Neuromuscular re-education;DME Instruction;Patient/family education;Passive range of motion;Cognitive remediation   PT Next Visit Plan Progress HEP, maybe supine scapular series with yellow theraband. Cont A/AA/P/ROM of Rt shoulder ROM, active shoulder work esepcially with right scapular muscles.    Consulted and Agree with Plan of Care Patient      Patient will benefit from skilled therapeutic intervention in  order to improve the following deficits and impairments:  Decreased range of motion, Obesity, Impaired UE functional use, Decreased endurance, Decreased skin integrity, Pain, Impaired perceived functional ability, Decreased activity tolerance, Decreased knowledge of use of DME, Postural dysfunction, Decreased strength  Visit Diagnosis: Stiffness of right shoulder, not elsewhere classified  Localized swelling, mass and lump, right upper limb  Muscle weakness (generalized)     Problem List Patient Active Problem List   Diagnosis Date Noted  . Breast cancer of lower-inner quadrant of right female breast ([place]) 06/27/2016  . OSA on CPAP 06/26/2016  . Type 2 diabetes mellitus without complication ([place]) [id number]  . Severe obesity (BMI >= 40) ([place]) 09/04/2014  . Essential hypertension 09/04/2014  . HLD (hyperlipidemia) 09/04/2014  . Gastroesophageal reflux disease without esophagitis 09/04/2014  . Asthma, mild persistent 09/04/2014  . Urge incontinence 09/04/2014  . Seasonal allergies 09/04/2014  . Generalized anxiety disorder 09/04/2014    Otelia Limes, PTA 09/04/2016, 2:40 PM  [place], [place], [address] Phone: [phone]   Fax:  [phone]  Name: DIAVIAN VANDERJAGT MRN: [id number] Date of Birth: 08-Jan-1941

## 2016-09-05 ENCOUNTER — Telehealth: Payer: Self-pay | Admitting: *Deleted

## 2016-09-05 ENCOUNTER — Ambulatory Visit (HOSPITAL_COMMUNITY): Payer: Medicare PPO | Admitting: Anesthesiology

## 2016-09-05 ENCOUNTER — Ambulatory Visit (HOSPITAL_COMMUNITY): Payer: Medicare PPO

## 2016-09-05 ENCOUNTER — Ambulatory Visit (HOSPITAL_COMMUNITY)
Admission: RE | Admit: 2016-09-05 | Discharge: 2016-09-05 | Disposition: A | Discharge status: Home / Self Care | Payer: Medicare PPO | Source: Ambulatory Visit | Attending: General Surgery | Admitting: General Surgery

## 2016-09-05 ENCOUNTER — Encounter: Payer: Self-pay | Admitting: Hematology

## 2016-09-05 ENCOUNTER — Encounter (HOSPITAL_COMMUNITY): Payer: Self-pay | Admitting: *Deleted

## 2016-09-05 ENCOUNTER — Encounter (HOSPITAL_COMMUNITY)
Admission: RE | Disposition: A | Discharge status: Home / Self Care | Payer: Self-pay | Source: Ambulatory Visit | Attending: General Surgery

## 2016-09-05 DIAGNOSIS — F419 Anxiety disorder, unspecified: Secondary | ICD-10-CM | POA: Diagnosis not present

## 2016-09-05 DIAGNOSIS — J45909 Unspecified asthma, uncomplicated: Secondary | ICD-10-CM | POA: Insufficient documentation

## 2016-09-05 DIAGNOSIS — C773 Secondary and unspecified malignant neoplasm of axilla and upper limb lymph nodes: Secondary | ICD-10-CM | POA: Insufficient documentation

## 2016-09-05 DIAGNOSIS — Z96653 Presence of artificial knee joint, bilateral: Secondary | ICD-10-CM | POA: Insufficient documentation

## 2016-09-05 DIAGNOSIS — G4730 Sleep apnea, unspecified: Secondary | ICD-10-CM | POA: Insufficient documentation

## 2016-09-05 DIAGNOSIS — C50911 Malignant neoplasm of unspecified site of right female breast: Secondary | ICD-10-CM | POA: Diagnosis not present

## 2016-09-05 DIAGNOSIS — E119 Type 2 diabetes mellitus without complications: Secondary | ICD-10-CM | POA: Insufficient documentation

## 2016-09-05 DIAGNOSIS — K219 Gastro-esophageal reflux disease without esophagitis: Secondary | ICD-10-CM | POA: Diagnosis not present

## 2016-09-05 DIAGNOSIS — Z170 Estrogen receptor positive status [ER+]: Secondary | ICD-10-CM | POA: Diagnosis not present

## 2016-09-05 DIAGNOSIS — Z7982 Long term (current) use of aspirin: Secondary | ICD-10-CM | POA: Insufficient documentation

## 2016-09-05 DIAGNOSIS — Z87891 Personal history of nicotine dependence: Secondary | ICD-10-CM | POA: Insufficient documentation

## 2016-09-05 DIAGNOSIS — Z95828 Presence of other vascular implants and grafts: Secondary | ICD-10-CM

## 2016-09-05 DIAGNOSIS — C50919 Malignant neoplasm of unspecified site of unspecified female breast: Secondary | ICD-10-CM | POA: Diagnosis not present

## 2016-09-05 DIAGNOSIS — Z452 Encounter for adjustment and management of vascular access device: Secondary | ICD-10-CM | POA: Diagnosis not present

## 2016-09-05 DIAGNOSIS — Z6841 Body Mass Index (BMI) 40.0 and over, adult: Secondary | ICD-10-CM | POA: Insufficient documentation

## 2016-09-05 DIAGNOSIS — E669 Obesity, unspecified: Secondary | ICD-10-CM | POA: Diagnosis not present

## 2016-09-05 DIAGNOSIS — I10 Essential (primary) hypertension: Secondary | ICD-10-CM | POA: Diagnosis not present

## 2016-09-05 DIAGNOSIS — C50311 Malignant neoplasm of lower-inner quadrant of right female breast: Secondary | ICD-10-CM | POA: Diagnosis present

## 2016-09-05 DIAGNOSIS — Z79899 Other long term (current) drug therapy: Secondary | ICD-10-CM | POA: Insufficient documentation

## 2016-09-05 HISTORY — PX: PORTACATH PLACEMENT: SHX2246

## 2016-09-05 SURGERY — INSERTION, TUNNELED CENTRAL VENOUS DEVICE, WITH PORT
Anesthesia: General

## 2016-09-05 MED ORDER — FENTANYL CITRATE (PF) 100 MCG/2ML IJ SOLN
25.0000 ug | INTRAMUSCULAR | Status: DC | PRN
  Administered 2016-09-05 (×2): 50 ug via INTRAVENOUS

## 2016-09-05 MED ORDER — DEXAMETHASONE SODIUM PHOSPHATE 10 MG/ML IJ SOLN
INTRAMUSCULAR | Status: AC
  Filled 2016-09-05: qty 1

## 2016-09-05 MED ORDER — AMLODIPINE BESYLATE 5 MG PO TABS
5.0000 mg | ORAL_TABLET | Freq: Once | ORAL | Status: AC
  Administered 2016-09-05: 5 mg via ORAL
  Filled 2016-09-05: qty 1

## 2016-09-05 MED ORDER — LACTATED RINGERS IV SOLN
INTRAVENOUS | Status: DC | PRN
  Administered 2016-09-05: 07:00:00 via INTRAVENOUS

## 2016-09-05 MED ORDER — BUPIVACAINE-EPINEPHRINE (PF) 0.5% -1:200000 IJ SOLN
INTRAMUSCULAR | Status: DC | PRN
  Administered 2016-09-05: 25 mL

## 2016-09-05 MED ORDER — HEPARIN SOD (PORK) LOCK FLUSH 100 UNIT/ML IV SOLN
INTRAVENOUS | Status: AC
  Filled 2016-09-05: qty 5

## 2016-09-05 MED ORDER — ROCURONIUM BROMIDE 10 MG/ML (PF) SYRINGE
PREFILLED_SYRINGE | INTRAVENOUS | Status: AC
  Filled 2016-09-05: qty 10

## 2016-09-05 MED ORDER — 0.9 % SODIUM CHLORIDE (POUR BTL) OPTIME
TOPICAL | Status: DC | PRN
  Administered 2016-09-05: 1000 mL

## 2016-09-05 MED ORDER — HEPARIN SOD (PORK) LOCK FLUSH 100 UNIT/ML IV SOLN
INTRAVENOUS | Status: DC | PRN
  Administered 2016-09-05: 500 [IU] via INTRAVENOUS

## 2016-09-05 MED ORDER — SODIUM CHLORIDE 0.9 % IV SOLN: Freq: Once | INTRAVENOUS | Status: DC

## 2016-09-05 MED ORDER — ONDANSETRON HCL 4 MG/2ML IJ SOLN
INTRAMUSCULAR | Status: AC
  Filled 2016-09-05: qty 2

## 2016-09-05 MED ORDER — DEXAMETHASONE SODIUM PHOSPHATE 10 MG/ML IJ SOLN
INTRAMUSCULAR | Status: DC | PRN
  Administered 2016-09-05: 10 mg via INTRAVENOUS

## 2016-09-05 MED ORDER — CEFAZOLIN SODIUM-DEXTROSE 2-4 GM/100ML-% IV SOLN
INTRAVENOUS | Status: AC
  Filled 2016-09-05: qty 100

## 2016-09-05 MED ORDER — FENTANYL CITRATE (PF) 100 MCG/2ML IJ SOLN
INTRAMUSCULAR | Status: AC
  Filled 2016-09-05: qty 2

## 2016-09-05 MED ORDER — ACETAMINOPHEN 500 MG PO TABS
1000.0000 mg | ORAL_TABLET | ORAL | Status: AC
  Administered 2016-09-05: 1000 mg via ORAL
  Filled 2016-09-05: qty 2

## 2016-09-05 MED ORDER — SODIUM CHLORIDE 0.9 % IV SOLN
Freq: Once | INTRAVENOUS | Status: AC
  Administered 2016-09-05: 50 mL
  Filled 2016-09-05: qty 1.2

## 2016-09-05 MED ORDER — HYDROCODONE-ACETAMINOPHEN 5-325 MG PO TABS: 1.0000 | ORAL_TABLET | ORAL | 0 refills | Status: DC | PRN

## 2016-09-05 MED ORDER — LIDOCAINE 2% (20 MG/ML) 5 ML SYRINGE
INTRAMUSCULAR | Status: AC
  Filled 2016-09-05: qty 5

## 2016-09-05 MED ORDER — BUPIVACAINE-EPINEPHRINE 0.5% -1:200000 IJ SOLN
INTRAMUSCULAR | Status: AC
  Filled 2016-09-05: qty 1

## 2016-09-05 MED ORDER — CEFAZOLIN SODIUM-DEXTROSE 2-4 GM/100ML-% IV SOLN
2.0000 g | INTRAVENOUS | Status: AC
  Administered 2016-09-05: 2 g via INTRAVENOUS
  Filled 2016-09-05: qty 100

## 2016-09-05 MED ORDER — LIDOCAINE 2% (20 MG/ML) 5 ML SYRINGE
INTRAMUSCULAR | Status: DC | PRN
  Administered 2016-09-05: 100 mg via INTRAVENOUS

## 2016-09-05 MED ORDER — PROPOFOL 10 MG/ML IV BOLUS
INTRAVENOUS | Status: AC
Start: 2016-09-05 — End: 2016-09-05
  Filled 2016-09-05: qty 40

## 2016-09-05 MED ORDER — ONDANSETRON HCL 4 MG/2ML IJ SOLN: 4.0000 mg | Freq: Once | INTRAMUSCULAR | Status: DC | PRN

## 2016-09-05 MED ORDER — FENTANYL CITRATE (PF) 100 MCG/2ML IJ SOLN
INTRAMUSCULAR | Status: DC | PRN
  Administered 2016-09-05 (×4): 25 ug via INTRAVENOUS

## 2016-09-05 MED ORDER — ONDANSETRON HCL 4 MG/2ML IJ SOLN
INTRAMUSCULAR | Status: DC | PRN
  Administered 2016-09-05: 4 mg via INTRAVENOUS

## 2016-09-05 MED ORDER — PROPOFOL 10 MG/ML IV BOLUS
INTRAVENOUS | Status: DC | PRN
  Administered 2016-09-05: 150 mg via INTRAVENOUS

## 2016-09-05 SURGICAL SUPPLY — 26 items
BAG DECANTER FOR FLEXI CONT (MISCELLANEOUS) ×3 IMPLANT
BLADE SURG 15 STRL LF DISP TIS (BLADE) ×1 IMPLANT
BLADE SURG 15 STRL SS (BLADE) ×2
CHLORAPREP W/TINT 26ML (MISCELLANEOUS) ×3 IMPLANT
COVER SURGICAL LIGHT HANDLE (MISCELLANEOUS) ×3 IMPLANT
DECANTER SPIKE VIAL GLASS SM (MISCELLANEOUS) ×3 IMPLANT
DRAPE C-ARM 42X120 X-RAY (DRAPES) ×3 IMPLANT
DRAPE LAPAROSCOPIC ABDOMINAL (DRAPES) ×3 IMPLANT
ELECT PENCIL ROCKER SW 15FT (MISCELLANEOUS) ×3 IMPLANT
ELECT REM PT RETURN 9FT ADLT (ELECTROSURGICAL) ×3
ELECTRODE REM PT RTRN 9FT ADLT (ELECTROSURGICAL) ×1 IMPLANT
GAUZE SPONGE 4X4 16PLY XRAY LF (GAUZE/BANDAGES/DRESSINGS) ×6 IMPLANT
GLOVE EUDERMIC 7 POWDERFREE (GLOVE) ×6 IMPLANT
GOWN STRL REUS W/TWL XL LVL3 (GOWN DISPOSABLE) ×6 IMPLANT
KIT BASIN OR (CUSTOM PROCEDURE TRAY) ×3 IMPLANT
KIT PORT POWER 8FR ISP CVUE (Catheter) ×3 IMPLANT
LIQUID BAND (GAUZE/BANDAGES/DRESSINGS) ×3 IMPLANT
NEEDLE HYPO 22GX1.5 SAFETY (NEEDLE) ×3 IMPLANT
PACK BASIC VI WITH GOWN DISP (CUSTOM PROCEDURE TRAY) ×3 IMPLANT
SUT MNCRL AB 4-0 PS2 18 (SUTURE) ×3 IMPLANT
SUT PROLENE 2 0 SH DA (SUTURE) ×3 IMPLANT
SUT VIC AB 3-0 SH 18 (SUTURE) ×3 IMPLANT
SYR 20CC LL (SYRINGE) ×3 IMPLANT
SYRINGE 10CC LL (SYRINGE) ×3 IMPLANT
TOWEL OR 17X26 10 PK STRL BLUE (TOWEL DISPOSABLE) ×3 IMPLANT
TOWEL OR NON WOVEN STRL DISP B (DISPOSABLE) ×3 IMPLANT

## 2016-09-05 NOTE — Interval H&P Note (Signed)
History and Physical Interval Note:  09/05/2016 6:54 AM  Tiffany Arnold  has presented today for surgery, with the diagnosis of BREAST CANCER  The various methods of treatment have been discussed with the patient and family. After consideration of risks, benefits and other options for treatment, the patient has consented to  Procedure(s): INSERTION PORT-A-CATH WITH [place] (N/A) as a surgical intervention .  The patient's history has been reviewed, patient examined, no change in status, stable for surgery.  I have reviewed the patient's chart and labs.  Questions were answered to the patient's satisfaction.     Adin Hector

## 2016-09-05 NOTE — Addendum Note (Signed)
Addendum  created 09/05/16 1155 by Talbot Grumbling, CRNA   Charge Capture section accepted

## 2016-09-05 NOTE — Transfer of Care (Signed)
Immediate Anesthesia Transfer of Care Note  Patient: Tiffany Arnold  Procedure(s) Performed: Procedure(s): INSERTION PORT-A-CATH (N/A)  Patient Location: PACU  Anesthesia Type:General  Level of Consciousness:  sedated, patient cooperative and responds to stimulation  Airway & Oxygen Therapy:Patient Spontanous Breathing and Patient connected to face mask oxgen  Post-op Assessment:  Report given to PACU RN and Post -op Vital signs reviewed and stable  Post vital signs:  Reviewed and stable  Last Vitals:  Vitals:   09/05/16 0547  BP: (!) 156/71  Pulse: 74  Resp: 18  Temp: [id number] C    Complications: No apparent anesthesia complications

## 2016-09-05 NOTE — Anesthesia Procedure Notes (Signed)
Procedure Name: LMA Insertion Date/Time: 09/05/2016 7:19 AM Performed by: Talbot Grumbling Pre-anesthesia Checklist: Patient identified, Emergency Drugs available, Suction available and Patient being monitored Patient Re-evaluated:Patient Re-evaluated prior to inductionOxygen Delivery Method: Circle system utilized Preoxygenation: Pre-oxygenation with 100% oxygen Intubation Type: IV induction Ventilation: Mask ventilation without difficulty LMA: LMA inserted LMA Size: 5.0 Number of attempts: 1 Placement Confirmation: positive ETCO2 and breath sounds checked- equal and bilateral Tube secured with: Tape Dental Injury: Teeth and Oropharynx as per pre-operative assessment

## 2016-09-05 NOTE — Op Note (Signed)
Patient Name:           Tiffany Arnold   Date of Surgery:        09/05/2016  Pre op Diagnosis:      Invasive ductal carcinoma right breast, receptor positive, HER-2 negative, stage TIc, N1 a  Post op Diagnosis:    Same  Procedure:                 Insertion of 8 French ClearVue tunneled venous vascular access device, use of fluoroscopy for guidance and positioning  Surgeon:                     Edsel Petrin. Dalbert Batman, M.D., FACS  Assistant:                      OR staff  Operative Indications:   This is a very pleasant 75 year old female who returns for a  visit following breast conservation surgery for right breast cancer. She lives in [place]. Her PCP is Dr. Bjorn Loser. Dr. Burr Medico is her oncologist.      On July 29, 2016 she underwent right breast lumpectomy with double wire needle localization and complete right axillary lymph node dissection. 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 Negative margins. 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 Receptor positive. HER-2 negative. She has done well from surgery. Drain is out.       She has discussed management with Dr. Burr Medico.  The Mammaprint score must be high enough and Dr. Burr Medico plans four cycles of chemotherapy .  After thinking about this for some time, the patient and Dr. Burr Medico have requested that I insert a port.  We are going to go ahead and schedule that surgery. She knows that she will probably get radiation therapy after that, and then anti-hormone pills.      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    Operative Findings:       We were able to place the catheter and port through the right subclavian vein without difficulty.  We had excellent blood return and the catheter flushed easily.  Fluoroscopy confirmed good positioning of the catheter tip in the superior vena  cava just above the right atrium and no deformity of the catheter.  Procedure in Detail:          Following the induction of general LMA anesthesia the patient's neck and chest were prepped and draped in a sterile fashion, surgical timeout was performed, intravenous antibiotics were given.  0.5% Marcaine with epinephrine was used as local infiltration anesthetic.  Right subclavian venipuncture was performed with a single pass and the guidewire inserted into the central venous circulation under fluoroscopic guidance.  Using the C-arm I drew a template on the chest wall to guide positioning of the catheter and proper length of the catheter.  A small incision was made at the wire insertion site.  A transverse incision was made below the medial third of the right clavicle.  I had to debride the subcutaneous tissue because it was fairly generous.  I created a subcutaneous pocket.  Using a tunneling device I passed the catheter from the wire insertion site to the port pocket site.  Using the fluoroscopic template on the chest wall I measured the catheter and cut it 23 cm in length.  The catheter was secured to  the port with the locking device and the port and catheter flushed with heparinized saline.  The port was sutured to the pectoralis fascia with 3 interrupted sutures of 2-0 Prolene.  The dilator and peel-away sheath were inserted over the guidewire into the central venous circulation without difficulty.  The wire and dilator were removed, the catheter threaded through the peel-away sheath easily and the peel-away sheath removed.  Fluoroscopy confirmed good positioning as above.  The catheter flushed easily and had good blood return.  We flushed the port and catheter with concentrated heparin.  Hemostasis was good and the wounds looked clean.  The subcutaneous  tissue was closed with 3-0 Vicryl sutures and the skin closed with subcuticular 4-0 Monocryl and Dermabond.  The patient tolerated the procedure well was  taken to PACU in stable condition.  EBL 20 mL.  Counts correct.  Complications none.  Chest x-ray is planned in PACU.     Edsel Petrin. Dalbert Batman, M.D., FACS General and Minimally Invasive Surgery Breast and Colorectal Surgery  09/05/2016 8:22 AM

## 2016-09-05 NOTE — Discharge Instructions (Signed)
    PORT-A-CATH: POST OP INSTRUCTIONS  Always review your discharge instruction sheet given to you by the facility where your surgery was performed.   1. A prescription for pain medication may be given to you upon discharge. Take your pain medication as prescribed, if needed. If narcotic pain medicine is not needed, then you make take acetaminophen (Tylenol) or ibuprofen (Advil) as needed.  2. Take your usually prescribed medications unless otherwise directed. 3. If you need a refill on your pain medication, please contact our office. All narcotic pain medicine now requires a paper prescription.  Phoned in and fax refills are no longer allowed by law.  Prescriptions will not be filled after 5 pm or on weekends.  4. You should follow a light diet for the remainder of the day after your procedure. 5. Most patients will experience some mild swelling and/or bruising in the area of the incision. It may take several days to resolve. 6. It is common to experience some constipation if taking pain medication after surgery. Increasing fluid intake and taking a stool softener (such as Colace) will usually help or prevent this problem from occurring. A mild laxative (Milk of Magnesia or Miralax) should be taken according to package directions if there are no bowel movements after 48 hours.  7. Unless discharge instructions indicate otherwise, you may remove your bandages 48 hours after surgery, and you may shower at that time. You may have steri-strips (small white skin tapes) in place directly over the incision.  These strips should be left on the skin for 7-10 days.  If your surgeon used Dermabond (skin glue) on the incision, you may shower in 24 hours.  The glue will flake off over the next 2-3 weeks.  8. If your port is left accessed at the end of surgery (needle left in port), the dressing cannot get wet and should only by changed by a healthcare professional. When the port is no longer accessed (when the  needle has been removed), follow step 7.   9. ACTIVITIES:  Limit activity involving your arms for the next 72 hours. Do no strenuous exercise or activity for 1 week. You may drive when you are no longer taking prescription pain medication, you can comfortably wear a seatbelt, and you can maneuver your car. 10.You may need to see your doctor in the office for a follow-up appointment.  Please       check with your doctor.  11.When you receive a new Port-a-Cath, you will get a product guide and        ID card.  Please keep them in case you need them.  WHEN TO CALL YOUR DOCTOR ([phone]): 1. Fever over 101.0 2. Chills 3. Continued bleeding from incision 4. Increased redness and tenderness at the site 5. Shortness of breath, difficulty breathing   The clinic staff is available to answer your questions during regular business hours. Please don't hesitate to call and ask to speak to one of the nurses or medical assistants for clinical concerns. If you have a medical emergency, go to the nearest emergency room or call 911.  A surgeon from [facility] is always on call at the hospital.     For further information, please visit [email or web address]      

## 2016-09-05 NOTE — Anesthesia Postprocedure Evaluation (Signed)
Anesthesia Post Note  Patient: Tiffany Arnold  Procedure(s) Performed: Procedure(s) (LRB): INSERTION PORT-A-CATH (N/A)  Patient location during evaluation: PACU Anesthesia Type: General Level of consciousness: awake and alert Pain management: pain level controlled Vital Signs Assessment: post-procedure vital signs reviewed and stable Respiratory status: spontaneous breathing, nonlabored ventilation, respiratory function stable and patient connected to nasal cannula oxygen Cardiovascular status: blood pressure returned to baseline and stable Postop Assessment: no signs of nausea or vomiting Anesthetic complications: no    Last Vitals:  Vitals:   09/05/16 0918 09/05/16 0928  BP:  (!) 129/57  Pulse: 62 62  Resp: 12   Temp:  36.5 C    Last Pain:  Vitals:   09/05/16 0825  PainSc: 4                  [name]

## 2016-09-05 NOTE — Progress Notes (Signed)
Left msg for pt to return my call to discuss copay assistance w/ PAF and to discuss the [employer].

## 2016-09-05 NOTE — Telephone Encounter (Signed)
"  Tiffany Arnold with [employer] calling in reference to Genetic testing request.  This has been denied due to no medical necessity.  Provider has the option to do a peer to peer by calling [phone] using case number [id number]"

## 2016-09-08 ENCOUNTER — Encounter: Payer: Self-pay | Admitting: General Practice

## 2016-09-08 ENCOUNTER — Telehealth: Payer: Self-pay | Admitting: *Deleted

## 2016-09-08 NOTE — Progress Notes (Signed)
[place] [facility] Note  Received VM from pt's spouse Sherell Whitledge with time stamp of ca 7pm on Saturday, Sept 16.  He was looking for assistance to assess a sx for pt.  Checked Dasia's chart for related entries; finding none, returned pt's call, leaving VM with hours, numbers, and role of [facility].  Spoke with nurse navigator Varney Biles Martini/RN to request nursing f/u support, as well.  Please page if additional needs arise. Thank you.   [place], [place], [facility] Pager [phone] Voicemail [phone]

## 2016-09-08 NOTE — Telephone Encounter (Signed)
  Oncology Nurse Navigator Documentation  Navigator Location: [facility] (09/08/16 1400) Navigator Encounter Type: Telephone (09/08/16 1400) Telephone: Tiffany Arnold Call;Appt Confirmation/Clarification (09/08/16 1400)  Discussed EMLA application and appts for 1st chemo                                      Time Spent with Patient: 15 (09/08/16 1400)

## 2016-09-09 ENCOUNTER — Encounter: Payer: Self-pay | Admitting: Hematology

## 2016-09-09 ENCOUNTER — Telehealth: Payer: Self-pay | Admitting: Hematology

## 2016-09-09 ENCOUNTER — Ambulatory Visit (HOSPITAL_BASED_OUTPATIENT_CLINIC_OR_DEPARTMENT_OTHER): Payer: Medicare PPO | Admitting: Hematology

## 2016-09-09 ENCOUNTER — Ambulatory Visit (HOSPITAL_BASED_OUTPATIENT_CLINIC_OR_DEPARTMENT_OTHER): Payer: Medicare PPO

## 2016-09-09 ENCOUNTER — Encounter: Payer: Self-pay | Admitting: *Deleted

## 2016-09-09 VITALS — BP 123/60 | HR 68 | Temp 98.3°F | Resp 18

## 2016-09-09 VITALS — BP 169/85 | HR 68 | Temp 97.9°F | Resp 17 | Ht 60.0 in | Wt 250.4 lb

## 2016-09-09 DIAGNOSIS — Z5111 Encounter for antineoplastic chemotherapy: Secondary | ICD-10-CM | POA: Diagnosis not present

## 2016-09-09 DIAGNOSIS — Z9989 Dependence on other enabling machines and devices: Secondary | ICD-10-CM

## 2016-09-09 DIAGNOSIS — C50311 Malignant neoplasm of lower-inner quadrant of right female breast: Secondary | ICD-10-CM

## 2016-09-09 DIAGNOSIS — I10 Essential (primary) hypertension: Secondary | ICD-10-CM | POA: Diagnosis not present

## 2016-09-09 DIAGNOSIS — G4733 Obstructive sleep apnea (adult) (pediatric): Secondary | ICD-10-CM

## 2016-09-09 DIAGNOSIS — E119 Type 2 diabetes mellitus without complications: Secondary | ICD-10-CM

## 2016-09-09 MED ORDER — PALONOSETRON HCL INJECTION 0.25 MG/5ML
0.2500 mg | Freq: Once | INTRAVENOUS | Status: AC
  Administered 2016-09-09: 0.25 mg via INTRAVENOUS

## 2016-09-09 MED ORDER — SODIUM CHLORIDE 0.9 % IV SOLN
Freq: Once | INTRAVENOUS | Status: AC
  Administered 2016-09-09: 11:00:00 via INTRAVENOUS

## 2016-09-09 MED ORDER — SODIUM CHLORIDE 0.9% FLUSH
10.0000 mL | INTRAVENOUS | Status: DC | PRN
  Administered 2016-09-09: 10 mL
  Filled 2016-09-09: qty 10

## 2016-09-09 MED ORDER — SODIUM CHLORIDE 0.9 % IV SOLN
75.0000 mg/m2 | Freq: Once | INTRAVENOUS | Status: AC
  Administered 2016-09-09: 160 mg via INTRAVENOUS
  Filled 2016-09-09: qty 16

## 2016-09-09 MED ORDER — DEXAMETHASONE 4 MG PO TABS: 4.0000 mg | ORAL_TABLET | Freq: Two times a day (BID) | ORAL | 1 refills | Status: DC

## 2016-09-09 MED ORDER — SODIUM CHLORIDE 0.9 % IV SOLN
10.0000 mg | Freq: Once | INTRAVENOUS | Status: AC
  Administered 2016-09-09: 10 mg via INTRAVENOUS
  Filled 2016-09-09: qty 1

## 2016-09-09 MED ORDER — PALONOSETRON HCL INJECTION 0.25 MG/5ML
INTRAVENOUS | Status: AC
  Filled 2016-09-09: qty 5

## 2016-09-09 MED ORDER — SODIUM CHLORIDE 0.9 % IV SOLN
600.0000 mg/m2 | Freq: Once | INTRAVENOUS | Status: AC
  Administered 2016-09-09: 1300 mg via INTRAVENOUS
  Filled 2016-09-09: qty 65

## 2016-09-09 MED ORDER — HEPARIN SOD (PORK) LOCK FLUSH 100 UNIT/ML IV SOLN
500.0000 [IU] | Freq: Once | INTRAVENOUS | Status: AC | PRN
  Administered 2016-09-09: 500 [IU]
  Filled 2016-09-09: qty 5

## 2016-09-09 NOTE — Progress Notes (Signed)
Introduced myself as her FA and discussed copay assistance w/ the Patient [employer].  Pt gave me consent to apply in her behalf but she didn't know her household income so she will contact me with that info.  Once received I will apply online as well as review for the [employer].

## 2016-09-09 NOTE — Progress Notes (Signed)
[place]  Telephone:(336) [phone] Fax:(336) [phone]  Clinic Follow Up Note   Patient Care Team: Tiffany Fenton, NP as PCP - General (Internal Medicine) 09/09/2016   CHIEF COMPLAINTS:  Follow Up right breast cancer  . Oncology History   Breast cancer of lower-inner quadrant of right female breast [facility])   Staging form: Breast, AJCC 7th Edition   - Clinical stage from 06/09/2016: Stage IIB (T2, N1, M0) - Signed by Tiffany Merle, MD on 06/27/2016   - Pathologic stage from 07/29/2016: Stage IIA (T1c, N1a, cM0) - Signed by Tiffany Merle, MD on 08/13/2016       Breast cancer of lower-inner quadrant of right female breast ([place])   05/23/2016 Mammogram    Diagnostic mammogram and ultrasound showed suspicious architectural distortion within the right breast lower inner quadrant, measuring 1.3 cm, without sonographic corelate.      06/03/2016 Initial Biopsy    Right breast inferior lower quadrant core needle biopsy showed atypical ductal hyperplasia with calcifications      06/09/2016 Receptors her2    Breast biopsy showed ER 100% positive, PR 100% positive, HER-2 negative, Ki-67 40%      06/09/2016 Initial Diagnosis    Breast cancer of lower-inner quadrant of right female breast ([place])      06/09/2016 Initial Biopsy    Right breast inner quadrant core needle biopsy showed invasive ductal carcinoma and DCIS, grade 1-2      06/17/2016 Initial Biopsy    Right axillary lymph node core needle biopsy showed metastatic carcinoma      06/17/2016 Receptors her2    Axillary node biopsy showed ER 100% positive, PR 95% positive, HER-2 negative      06/19/2016 Imaging    Bilateral breast MRI showed locations in the lower inner right breast, largest 2.7X1.3X1.6cm, biopsy clips in 2 of this areas. There are abnormal right axillary lymph nodes showing second cortical's, no evidence of malignancy in the left breast.      07/29/2016 Surgery    Right lumpectomy and ALND      07/29/2016  Pathology Results    Right lumpectomy showed G3 IDC, DCIS, margins (-), LVI(-).  1 of 16 nodes was positive       07/29/2016 Miscellaneous    Mammaprint showed high risk disease, luminal type B       HISTORY OF PRESENTING ILLNESS:  Tiffany Arnold 75 y.o. female is here because of her recently diagnosed left breast cancer. She presents to my clinic with her friend, who is a Marine scientist.   Her cancer was discovered by screening mammogram. She had a right breast cyst in 2001, which was removed. She has been doing mammogram once a year. The mammogram and ultrasound on 05/23/2016 showed a suspicious architectural this portion, 1.3 cm, without sonographic correlation. She underwent core needle biopsy of the right breast mass twice and right axilla node biopsy, one breast biopsy and node biopsy showed invasive ductal carcinoma and DCIS, ER/PR strong positive, HER-2 negative.  She denies any other new symptoms. She has noticed mild fatigued lately, she still works full time in a vet's office, she has IBS, has intermittent constipation and diarrhea. She has arthritis, and both knee replacement and shoulder surgery before, she also has some back pain lately, she takes tylenol occasionally.  She lives with her husband, moderately active. No family history of breast cancer  GYN HISTORY  Menarchal: 13 LMP: 49 Contraceptive: 4-5 years HRT: 3 years  G2P2: no breast feeding, daughter  16 yo and son is 74 yo.   CURRENT THERAPY: Adjuvant chemotherapy with docetaxel and Cytoxan (TC) every 3 weeks, starting on 09/09/2069  INTERIM HISTORY: Tiffany Arnold returns for follow up and the first dose chemotherapy today. She is accompanied by her daughter to clinic today. She has had a port placed a few days ago, tolerated procedure well. She otherwise feels well, no other new complaints. She has attended the chemotherapy class, and decided to proceed with adjuvant chemotherapy.    MEDICAL HISTORY:  Past Medical History:    Diagnosis Date  . Allergy   . Arthritis   . Asthma   . Cancer ([place]) 07/29/2016   right breast  . Cataract of both eyes   . Chicken pox   . Colon polyps   . Dog bite of index finger 07/23/2016   Left index finger  . GERD (gastroesophageal reflux disease)   . Hyperlipidemia   . Hypertension   . IBS (irritable bowel syndrome)   . Phlebitis   . Pneumonia    hx of several yrs ago  . Shortness of breath dyspnea    with exertion only  . Sleep apnea    wears CPAP machine nightly    SURGICAL HISTORY: Past Surgical History:  Procedure Laterality Date  . BACK SURGERY     lower  . BREAST CYST EXCISION Right 2001   negative  . BREAST LUMPECTOMY WITH NEEDLE LOCALIZATION AND AXILLARY LYMPH NODE DISSECTION Right 07/29/2016   Procedure: RIGHT BREAST LUMPECTOMY WITH DOUBLE NEEDLE LOCALIZATION AND COMPLETE RIGHT AXILLARY LYMPH NODE DISSECTION;  Surgeon: Tiffany Skates, MD;  Location: [facility];  Service: General;  Laterality: Right;  . BREAST SURGERY Left 2000   Biopsy  . BUNIONECTOMY Bilateral 1998   great toe fusion on right foot  . COLONOSCOPY W/ POLYPECTOMY    . HERNIA REPAIR  8309   [place] SINUS SURGERY  2015  . PORTACATH PLACEMENT N/A 09/05/2016   Procedure: INSERTION PORT-A-CATH;  Surgeon: Tiffany Skates, MD;  Location: [facility];  Service: General;  Laterality: N/A;  . REPLACEMENT TOTAL KNEE Bilateral 2007  . TOTAL SHOULDER REPLACEMENT Left 2007    SOCIAL HISTORY: Social History   Social History  . Marital status: Married    Spouse name: N/A  . Number of children: N/A  . Years of education: N/A   Occupational History  . Not on file.   Social History Main Topics  . Smoking status: Former Smoker    Packs/day: 2.00    Years: 25.00    Quit date: 12/22/1990  . Smokeless tobacco: Never Used     Comment: quit 24 years ago  . Alcohol use Yes     Comment: rare  . Drug use: No  . Sexual activity: Not Currently   Other Topics Concern  . Not on file   Social  History Narrative  . No narrative on file    FAMILY HISTORY: Family History  Problem Relation Age of Onset  . Arthritis Mother   . Stroke Mother   . Hypertension Mother   . Cancer Father     Prostate  . Stroke Father   . Hypertension Father   . Hypertension Maternal Grandmother   . Rheum arthritis Maternal Grandfather   . Stroke Maternal Grandfather   . Hypertension Maternal Grandfather   . Cancer Paternal Grandmother     Colon  . Hypertension Paternal Grandmother   . Hypertension Paternal Grandfather   . Breast cancer Neg Hx  ALLERGIES:  is allergic to other.  MEDICATIONS:  Current Outpatient Prescriptions  Medication Sig Dispense Refill  . albuterol (PROAIR HFA) 108 (90 BASE) MCG/ACT inhaler Inhale 2 puffs into the lungs every 6 (six) hours as needed for wheezing.     Marland Kitchen albuterol-ipratropium (COMBIVENT) 18-103 MCG/ACT inhaler Inhale 2 puffs into the lungs every 4 (four) hours as needed for wheezing or shortness of breath.     . ALPRAZolam (XANAX) 0.25 MG tablet TAKE ONE TABLET BY MOUTH TWICE DAILY AS NEEDED 60 tablet 0  . Calcium Carbonate-Vitamin D (CALCIUM-VITAMIN D) 500-200 MG-UNIT tablet Take 1 tablet by mouth daily.    . cetirizine (ZYRTEC) 10 MG tablet Take 10 mg by mouth daily.    Marland Kitchen EPINEPHrine 0.3 mg/0.3 mL IJ SOAJ injection Inject 0.3 mg into the muscle once as needed (ALLERGIC REACTION).     Marland Kitchen glucosamine-chondroitin 500-400 MG tablet Take 1 tablet by mouth 2 (two) times daily.    Marland Kitchen HYDROcodone-acetaminophen (NORCO/VICODIN) 5-325 MG tablet Take 1-2 tablets by mouth every 4 (four) hours as needed. 50 tablet 0  . lidocaine-prilocaine (EMLA) cream Apply to port at least 1 hour prior to chemo 30 g 1  . methocarbamol (ROBAXIN) 500 MG tablet Take 1 tablet (500 mg total) by mouth at bedtime as needed for muscle spasms. 10 tablet 0  . Olmesartan-Amlodipine-HCTZ 40-5-25 MG TABS TAKE ONE TABLET BY MOUTH ONCE DAILY 90 tablet 1  . ondansetron (ZOFRAN) 8 MG tablet Take 1  tablet (8 mg total) by mouth 2 (two) times daily as needed for refractory nausea / vomiting. Start on day 3 after chemo. 30 tablet 1  . oxybutynin (DITROPAN-XL) 10 MG 24 hr tablet Take 1 tablet (10 mg total) by mouth at bedtime. 30 tablet 5  . pravastatin (PRAVACHOL) 40 MG tablet TAKE ONE TABLET BY MOUTH ONCE DAILY 30 tablet 5  . prochlorperazine (COMPAZINE) 10 MG tablet Take 1 tablet (10 mg total) by mouth every 6 (six) hours as needed (Nausea or vomiting). 30 tablet 1  . ranitidine (ZANTAC) 150 MG tablet Take 150 mg by mouth every evening.     . vitamin B-12 (CYANOCOBALAMIN) 1000 MCG tablet Take 1,000 mcg by mouth daily.    Marland Kitchen dexamethasone (DECADRON) 4 MG tablet Take 1 tablet (4 mg total) by mouth 2 (two) times daily. Start the day before Taxotere. Then again the day after chemo for 3 days. 30 tablet 1  . Omega-3 Fatty Acids (FISH OIL) 1000 MG CAPS Take 1,000 mg by mouth daily.     No current facility-administered medications for this visit.    Facility-Administered Medications Ordered in Other Visits  Medication Dose Route Frequency Provider Last Rate Last Dose  . cyclophosphamide (CYTOXAN) 1,300 mg in sodium chloride 0.9 % 250 mL chemo infusion  600 mg/m2 (Treatment Plan Recorded) Intravenous Once Tiffany Merle, MD 630 mL/hr at 09/09/16 1153 1,300 mg at 09/09/16 1153  . DOCEtaxel (TAXOTERE) 160 mg in sodium chloride 0.9 % 250 mL chemo infusion  75 mg/m2 (Treatment Plan Recorded) Intravenous Once Tiffany Merle, MD      . heparin lock flush 100 unit/mL  500 Units Intracatheter Once PRN Tiffany Merle, MD      . sodium chloride flush (NS) 0.9 % injection 10 mL  10 mL Intracatheter PRN Tiffany Merle, MD        REVIEW OF SYSTEMS:   Constitutional: Denies fevers, chills or abnormal night sweats Eyes: Denies blurriness of vision, double vision or watery eyes Ears, nose, mouth, throat,  and face: Denies mucositis or sore throat Respiratory: Denies cough, dyspnea or wheezes Cardiovascular: Denies palpitation, chest  discomfort or lower extremity swelling Gastrointestinal:  Denies nausea, heartburn or change in bowel habits Skin: Denies abnormal skin rashes Lymphatics: Denies new lymphadenopathy or easy bruising Neurological:Denies numbness, tingling or new weaknesses Behavioral/Psych: Mood is stable, no new changes  All other systems were reviewed with the patient and are negative.  PHYSICAL EXAMINATION: ECOG PERFORMANCE STATUS: 0 - Asymptomatic  Vitals:   09/09/16 0946  BP: (!) 169/85  Pulse: 68  Resp: 17  Temp: 97.9 F (36.6 C)   Filed Weights   09/09/16 0946  Weight: 250 lb 6.4 oz (113.6 kg)    GENERAL:alert, no distress and comfortable SKIN: skin color, texture, turgor are normal, no rashes or significant lesions EYES: normal, conjunctiva are pink and non-injected, sclera clear OROPHARYNX:no exudate, no erythema and lips, buccal mucosa, and tongue normal  NECK: supple, thyroid normal size, non-tender, without nodularity LYMPH:  no palpable lymphadenopathy in the cervical, axillary or inguinal LUNGS: clear to auscultation and percussion with normal breathing effort HEART: regular rate & rhythm and no murmurs and no lower extremity edema ABDOMEN:abdomen soft, non-tender and normal bowel sounds Musculoskeletal:no cyanosis of digits and no clubbing  PSYCH: alert & oriented x 3 with fluent speech NEURO: no focal motor/sensory deficits Breasts: Breast inspection showed them to be symmetrical with no nipple discharge. (+) incision sites in right breast and axilla are healing well. Palpation of the breasts and axilla revealed no obvious mass that I could appreciate.   LABORATORY DATA:  I have reviewed the data as listed CBC Latest Ref Rng & Units 09/03/2016 07/29/2016 07/23/2016  WBC 3.9 - 10.3 10e3/uL 7.4 11.9(H) 16.0(H)  Hemoglobin 11.6 - 15.9 g/dL 13.8 11.9(L) 14.3  Hematocrit 34.8 - 46.6 % 42.0 37.8 44.2  Platelets 145 - 400 10e3/uL 263 244 337   CMP Latest Ref Rng & Units 09/03/2016  07/29/2016 07/23/2016  Glucose 70 - 140 mg/dl 113 - 129(H)  BUN 7.0 - 26.0 mg/dL 22.2 - 16  Creatinine 0.6 - 1.1 mg/dL 0.8 0.67 0.69  Sodium 136 - 145 mEq/L 140 - 136  Potassium 3.5 - 5.1 mEq/L 4.4 - 3.9  Chloride 101 - 111 mmol/L - - 98(L)  CO2 22 - 29 mEq/L 31(H) - 27  Calcium 8.4 - 10.4 mg/dL 10.5(H) - 10.4(H)  Total Protein 6.4 - 8.3 g/dL 7.2 - -  Total Bilirubin 0.20 - 1.20 mg/dL 0.40 - -  Alkaline Phos 40 - 150 U/L 74 - -  AST 5 - 34 U/L 15 - -  ALT 0 - 55 U/L 16 - -    PATHOLOGY REPORT  Diagnosis 06/03/2016 Breast, right, needle core biopsy, ILQ focal 1.3 cm asymmetry/distortion - ATYPICAL DUCTAL HYPERPLASIA WITH CALCIFICATIONS. - FIBROCYSTIC CHANGES WITH CALCIFICATIONS. - SEE COMMENT. Microscopic Comment The results were called to [facility] on 06/04/16. (JBK:ds 06/04/16)   Diagnosis 06/09/2016 Breast, right, needle core biopsy, inner - INVASIVE DUCTAL CARCINOMA. - DUCTAL CARCINOMA IN SITU. - SEE COMMENT. Microscopic Comment The carcinoma appears grade 1-2. A breast prognostic profile will be performed and the results reported separately. The results were called to [facility] on 06/10/2016. (JBK:ecj 06/10/2016) Results: HER2 - NEGATIVE RATIO OF HER2/CEP17 SIGNALS 1.55 AVERAGE HER2 COPY NUMBER PER CELL 2.25  Results: IMMUNOHISTOCHEMICAL AND MORPHOMETRIC ANALYSIS PERFORMED MANUALLY Estrogen Receptor: 100%, POSITIVE, STRONG STAINING INTENSITY Progesterone Receptor: 100%, POSITIVE, STRONG STAINING INTENSITY Proliferation Marker Ki67: 40%  Diagnosis 06/17/2016 Lymph node, needle/core biopsy, right, inferior, axilla to far lateral breast - METASTATIC CARCINOMA, SEE COMMENT. Microscopic Comment The morphology is consistent with the patient breast carcinoma. Prognostic markers will be ordered and reported in an addendum. The case was called to [facility] on 06/18/2016. Results: IMMUNOHISTOCHEMICAL AND  MORPHOMETRIC ANALYSIS PERFORMED MANUALLY Estrogen Receptor: 100%, POSITIVE, STRONG STAINING INTENSITY Progesterone Receptor: 95%, POSITIVE, STRONG STAINING INTENSITY  Results: HER2 - NEGATIVE RATIO OF HER2/CEP17 SIGNALS 1.25 AVERAGE HER2 COPY NUMBER PER CELL 2.00  Diagnosis 07/29/2016 1. Breast, lumpectomy, Right INVASIVE DUCTAL CARCINOMA, GRADE 3, SPANNING 1.5 CM DUCTAL CARCINOMA IN SITU IS PRESENT ALL MARGINS OF RESECTION ARE NEGATIVE FOR CARCINOMA 2. Lymph nodes, regional resection, Right axillary contents METASTATIC BREAST DUCTAL CARCINOMA IN ONE OF SIXTEEN LYMPH NODES (1/16) Specimen, including laterality and lymph node sampling (sentinel, non-sentinel): Right partial breast and regional lymph nodes Procedure: Lumpectomy Histologic type: Ductal carcinoma Grade: 3 Tubule formation: 3 Nuclear pleomorphism: 2 Mitotic:3 Tumor size (gross measurement or glass slide measurement): 1.5 cm Margins: Invasive, distance to closest margin: 0.7 cm In-situ, distance to closest margin: 0.7 cm If margin positive, focally or broadly: NA Lymphovascular invasion: Not identified Ductal carcinoma in situ: Present Grade: 3 Extensive intraductal component: moderate Lobular neoplasia: Negative Tumor focality: Focal Treatment effect: Negative If present, treatment effect in breast tissue, lymph nodes or both: NA Extent of tumor: Skin: Negative Nipple: Negative Skeletal muscle: Negative Lymph nodes: Examined: 0 Sentinel 16 Non-sentinel 16 Total Lymph nodes with metastasis: 1 Isolated tumor cells (< 0.2 mm): 0 Micrometastasis: (> 0.2 mm and < 2.0 mm): 0 Macrometastasis: (> 2.0 mm): 1 Extracapsular extension: Present Breast prognostic profile: Estrogen receptor: 100% Progesterone receptor: 100% Her 2 neu: Negative Ki-67: 40% Non-neoplastic breast: Unremarkable TNM: pT1c, pN1  RADIOGRAPHIC STUDIES: I have personally reviewed the radiological images as listed and agreed with the  findings in the report. Dg Chest Port 1 View  Result Date: 09/05/2016 CLINICAL DATA:  Port-A-Cath placement. History of right breast cancer. EXAM: PORTABLE CHEST 1 VIEW COMPARISON:  None. FINDINGS: 0832 hours. Right Port-A-Cath noted with tip position overlying the mid to distal SVC level. No evidence for pneumothorax. The cardio pericardial silhouette is enlarged. Interstitial markings are diffusely coarsened with chronic features. Patient is status post left shoulder replacement. IMPRESSION: Tip of right Port-A-Cath overlies the mid to distal SVC level. No evidence for pneumothorax. Electronically Signed   By: Misty Stanley M.D.   On: 09/05/2016 09:08   Dg C-arm 1-60 Min-no Report  Result Date: 09/05/2016 CLINICAL DATA: portacath C-ARM 1-60 MINUTES Fluoroscopy was utilized by the requesting physician.  No radiographic interpretation.   Diagnostic mammogram and ultrasound of right breast including right axillary 05/23/2016 IMPRESSION: Suspicious architectural distortion within the lower inner quadrant of the right breast, measuring 1.3 cm greatest dimension, without sonographic correlate. This distortion could conceivably be related to the patient's earlier surgical excision biopsy perform in 2001 (patient states that this earlier surgical excision was in this same region of her right breast), however, exclusion of a neoplastic cause is needed.  As such, stereotactic-guided biopsy, with 3D tomosynthesis, is recommended for this suspicious finding.   ASSESSMENT & PLAN: 75 year old Caucasian female, with mammogram discovered right breast cancer.  1. 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  -We reviewed her mammaprint  genomic test result, which showed luminal type B, high risk, average 10-year risk of recurrence without adjuvant therapy is 29% -given the high risk disease, I strongly recommend her to consider adjuvant chemo. She is relatively elderly, with some medical comorbilities, but still has good PS, so I recommend adjuvant docetaxel and cytoxan every 3 weeks for 4 cycles   -the goal of chemo is curative  -I again reviewed potential side effects from chemotherapy, especially neutropenia fever, she voiced good understanding. -lab results reviewed, we'll proceed to cycle 1 chemotherapy today. -She did not take his dexamethasone yesterday, I called in 59m twice daily for the next 3 days after chemotherapy  2. Hypertension and arthritis -She will continue medication and follow-up with her primary care physician -We discussed her that chemotherapy may affect her oral intake and her blood pressure, she is on 3 blood pressure medications including HCTZ, I strongly encouraged her to monitor her blood pressure at home, and we may need to adjust her medication if her blood pressure drops.  3. Morbid obesity -I encouraged her to have healthy diet and exercise regularly, try to lose some weight.  Plan -first cycle TC today, she will start dexamethasone 4 mg twice daily for the next 3 days -Return to clinic in 1 week with lab for toxicity check up -Return to clinic in 3 weeks with lab, flush and chemotherapy  All questions were answered. The patient knows to call the clinic with any problems, questions or concerns. I spent 20 minutes counseling the patient face to face. The total time spent in the appointment was 25 minutes and more than 50% was on counseling.     FTruitt Merle MD 09/09/2016 12:11 PM

## 2016-09-09 NOTE — Patient Instructions (Signed)
[facility] Discharge Instructions for Patients Receiving Chemotherapy  Today you received the following chemotherapy agents Cytoxan & Taxotere To help prevent nausea and vomiting after your treatment, we encourage you to take your nausea medication as directed.     If you develop nausea and vomiting that is not controlled by your nausea medication, call the clinic.   BELOW ARE SYMPTOMS THAT SHOULD BE REPORTED IMMEDIATELY:  *FEVER GREATER THAN 100.5 F  *CHILLS WITH OR WITHOUT FEVER  NAUSEA AND VOMITING THAT IS NOT CONTROLLED WITH YOUR NAUSEA MEDICATION  *UNUSUAL SHORTNESS OF BREATH  *UNUSUAL BRUISING OR BLEEDING  TENDERNESS IN MOUTH AND THROAT WITH OR WITHOUT PRESENCE OF ULCERS  *URINARY PROBLEMS  *BOWEL PROBLEMS  UNUSUAL RASH Items with * indicate a potential emergency and should be followed up as soon as possible.  Feel free to call the clinic you have any questions or concerns. The clinic phone number is [phone].  Please show the [facility] at check-in to the Emergency Department and triage nurse.   Cyclophosphamide injection What is this medicine? CYCLOPHOSPHAMIDE (sye kloe FOSS fa mide) is a chemotherapy drug. It slows the growth of cancer cells. This medicine is used to treat many types of cancer like lymphoma, myeloma, leukemia, breast cancer, and ovarian cancer, to name a few. This medicine may be used for other purposes; ask your health care provider or pharmacist if you have questions. What should I tell my health care provider before I take this medicine? They need to know if you have any of these conditions: -blood disorders -history of other chemotherapy -infection -kidney disease -liver disease -recent or ongoing radiation therapy -tumors in the bone marrow -an unusual or allergic reaction to cyclophosphamide, other chemotherapy, other medicines, foods, dyes, or preservatives -pregnant or trying to get  pregnant -breast-feeding How should I use this medicine? This drug is usually given as an injection into a vein or muscle or by infusion into a vein. It is administered in a hospital or clinic by a specially trained health care professional. Talk to your pediatrician regarding the use of this medicine in children. Special care may be needed. Overdosage: If you think you have taken too much of this medicine contact a poison control center or emergency room at once. NOTE: This medicine is only for you. Do not share this medicine with others. What if I miss a dose? It is important not to miss your dose. Call your doctor or health care professional if you are unable to keep an appointment. What may interact with this medicine? This medicine may interact with the following medications: -amiodarone -amphotericin B -azathioprine -certain antiviral medicines for HIV or AIDS such as protease inhibitors (e.g., indinavir, ritonavir) and zidovudine -certain blood pressure medications such as benazepril, captopril, enalapril, fosinopril, lisinopril, moexipril, monopril, perindopril, quinapril, ramipril, trandolapril -certain cancer medications such as anthracyclines (e.g., daunorubicin, doxorubicin), busulfan, cytarabine, paclitaxel, pentostatin, tamoxifen, trastuzumab -certain diuretics such as chlorothiazide, chlorthalidone, hydrochlorothiazide, indapamide, metolazone -certain medicines that treat or prevent blood clots like warfarin -certain muscle relaxants such as succinylcholine -cyclosporine -etanercept -indomethacin -medicines to increase blood counts like filgrastim, pegfilgrastim, sargramostim -medicines used as general anesthesia -metronidazole -natalizumab This list may not describe all possible interactions. Give your health care provider a list of all the medicines, herbs, non-prescription drugs, or dietary supplements you use. Also tell them if you smoke, drink alcohol, or use illegal  drugs. Some items may interact with your medicine. What should I watch for while using this  medicine? Visit your doctor for checks on your progress. This drug may make you feel generally unwell. This is not uncommon, as chemotherapy can affect healthy cells as well as cancer cells. Report any side effects. Continue your course of treatment even though you feel ill unless your doctor tells you to stop. Drink water or other fluids as directed. Urinate often, even at night. In some cases, you may be given additional medicines to help with side effects. Follow all directions for their use. Call your doctor or health care professional for advice if you get a fever, chills or sore throat, or other symptoms of a cold or flu. Do not treat yourself. This drug decreases your body's ability to fight infections. Try to avoid being around people who are sick. This medicine may increase your risk to bruise or bleed. Call your doctor or health care professional if you notice any unusual bleeding. Be careful brushing and flossing your teeth or using a toothpick because you may get an infection or bleed more easily. If you have any dental work done, tell your dentist you are receiving this medicine. You may get drowsy or dizzy. Do not drive, use machinery, or do anything that needs mental alertness until you know how this medicine affects you. Do not become pregnant while taking this medicine or for 1 year after stopping it. Women should inform their doctor if they wish to become pregnant or think they might be pregnant. Men should not father a child while taking this medicine and for 4 months after stopping it. There is a potential for serious side effects to an unborn child. Talk to your health care professional or pharmacist for more information. Do not breast-feed an infant while taking this medicine. This medicine may interfere with the ability to have a child. This medicine has caused ovarian failure in some women.  This medicine has caused reduced sperm counts in some men. You should talk with your doctor or health care professional if you are concerned about your fertility. If you are going to have surgery, tell your doctor or health care professional that you have taken this medicine. What side effects may I notice from receiving this medicine? Side effects that you should report to your doctor or health care professional as soon as possible: -allergic reactions like skin rash, itching or hives, swelling of the face, lips, or tongue -low blood counts - this medicine may decrease the number of white blood cells, red blood cells and platelets. You may be at increased risk for infections and bleeding. -signs of infection - fever or chills, cough, sore throat, pain or difficulty passing urine -signs of decreased platelets or bleeding - bruising, pinpoint red spots on the skin, black, tarry stools, blood in the urine -signs of decreased red blood cells - unusually weak or tired, fainting spells, lightheadedness -breathing problems -dark urine -dizziness -palpitations -swelling of the ankles, feet, hands -trouble passing urine or change in the amount of urine -weight gain -yellowing of the eyes or skin Side effects that usually do not require medical attention (report to your doctor or health care professional if they continue or are bothersome): -changes in nail or skin color -hair loss -missed menstrual periods -mouth sores -nausea, vomiting This list may not describe all possible side effects. Call your doctor for medical advice about side effects. You may report side effects to FDA at 1-800-FDA-1088. Where should I keep my medicine? This drug is given in a hospital or clinic and will  not be stored at home. NOTE: This sheet is a summary. It may not cover all possible information. If you have questions about this medicine, talk to your doctor, pharmacist, or health care provider.    2016,  Elsevier/Gold Standard. (2012-10-22 16:22:58)   Docetaxel injection What is this medicine? DOCETAXEL (doe se TAX el) is a chemotherapy drug. It targets fast dividing cells, like cancer cells, and causes these cells to die. This medicine is used to treat many types of cancers like breast cancer, certain stomach cancers, head and neck cancer, lung cancer, and prostate cancer. This medicine may be used for other purposes; ask your health care provider or pharmacist if you have questions. What should I tell my health care provider before I take this medicine? They need to know if you have any of these conditions: -infection (especially a virus infection such as chickenpox, cold sores, or herpes) -liver disease -low blood counts, like low white cell, platelet, or red cell counts -an unusual or allergic reaction to docetaxel, polysorbate 80, other chemotherapy agents, other medicines, foods, dyes, or preservatives -pregnant or trying to get pregnant -breast-feeding How should I use this medicine? This drug is given as an infusion into a vein. It is administered in a hospital or clinic by a specially trained health care professional. Talk to your pediatrician regarding the use of this medicine in children. Special care may be needed. Overdosage: If you think you have taken too much of this medicine contact a poison control center or emergency room at once. NOTE: This medicine is only for you. Do not share this medicine with others. What if I miss a dose? It is important not to miss your dose. Call your doctor or health care professional if you are unable to keep an appointment. What may interact with this medicine? -cyclosporine -erythromycin -ketoconazole -medicines to increase blood counts like filgrastim, pegfilgrastim, sargramostim -vaccines Talk to your doctor or health care professional before taking any of these  medicines: -acetaminophen -aspirin -ibuprofen -ketoprofen -naproxen This list may not describe all possible interactions. Give your health care provider a list of all the medicines, herbs, non-prescription drugs, or dietary supplements you use. Also tell them if you smoke, drink alcohol, or use illegal drugs. Some items may interact with your medicine. What should I watch for while using this medicine? Your condition will be monitored carefully while you are receiving this medicine. You will need important blood work done while you are taking this medicine. This drug may make you feel generally unwell. This is not uncommon, as chemotherapy can affect healthy cells as well as cancer cells. Report any side effects. Continue your course of treatment even though you feel ill unless your doctor tells you to stop. In some cases, you may be given additional medicines to help with side effects. Follow all directions for their use. Call your doctor or health care professional for advice if you get a fever, chills or sore throat, or other symptoms of a cold or flu. Do not treat yourself. This drug decreases your body's ability to fight infections. Try to avoid being around people who are sick. This medicine may increase your risk to bruise or bleed. Call your doctor or health care professional if you notice any unusual bleeding. This medicine may contain alcohol in the product. You may get drowsy or dizzy. Do not drive, use machinery, or do anything that needs mental alertness until you know how this medicine affects you. Do not stand or sit up  quickly, especially if you are an older patient. This reduces the risk of dizzy or fainting spells. Avoid alcoholic drinks. Do not become pregnant while taking this medicine. Women should inform their doctor if they wish to become pregnant or think they might be pregnant. There is a potential for serious side effects to an unborn child. Talk to your health care  professional or pharmacist for more information. Do not breast-feed an infant while taking this medicine. What side effects may I notice from receiving this medicine? Side effects that you should report to your doctor or health care professional as soon as possible: -allergic reactions like skin rash, itching or hives, swelling of the face, lips, or tongue -low blood counts - This drug may decrease the number of white blood cells, red blood cells and platelets. You may be at increased risk for infections and bleeding. -signs of infection - fever or chills, cough, sore throat, pain or difficulty passing urine -signs of decreased platelets or bleeding - bruising, pinpoint red spots on the skin, black, tarry stools, nosebleeds -signs of decreased red blood cells - unusually weak or tired, fainting spells, lightheadedness -breathing problems -fast or irregular heartbeat -low blood pressure -mouth sores -nausea and vomiting -pain, swelling, redness or irritation at the injection site -pain, tingling, numbness in the hands or feet -swelling of the ankle, feet, hands -weight gain Side effects that usually do not require medical attention (report to your prescriber or health care professional if they continue or are bothersome): -bone pain -complete hair loss including hair on your head, underarms, pubic hair, eyebrows, and eyelashes -diarrhea -excessive tearing -changes in the color of fingernails -loosening of the fingernails -nausea -muscle pain -red flush to skin -sweating -weak or tired This list may not describe all possible side effects. Call your doctor for medical advice about side effects. You may report side effects to FDA at 1-800-FDA-1088. Where should I keep my medicine? This drug is given in a hospital or clinic and will not be stored at home. NOTE: This sheet is a summary. It may not cover all possible information. If you have questions about this medicine, talk to your  doctor, pharmacist, or health care provider.    2016, Elsevier/Gold Standard. (2014-12-25 16:04:57)

## 2016-09-09 NOTE — Telephone Encounter (Signed)
Gave dtr avs report and appointments for September and October

## 2016-09-09 NOTE — Telephone Encounter (Signed)
I have called to appeal her mammaprint, waiting for call back.  Truitt Merle  09/09/2016

## 2016-09-10 ENCOUNTER — Encounter: Payer: Self-pay | Admitting: Hematology

## 2016-09-10 NOTE — Progress Notes (Signed)
Rcvd vm from pt's sister [name] to discuss copay assistance & the grant.  I called her back and left a msg to return my call.

## 2016-09-11 ENCOUNTER — Ambulatory Visit (HOSPITAL_BASED_OUTPATIENT_CLINIC_OR_DEPARTMENT_OTHER): Payer: Medicare PPO

## 2016-09-11 ENCOUNTER — Other Ambulatory Visit: Payer: Self-pay | Admitting: Hematology

## 2016-09-11 ENCOUNTER — Encounter: Payer: Self-pay | Admitting: *Deleted

## 2016-09-11 VITALS — BP 133/73 | HR 67 | Temp 97.8°F | Resp 18

## 2016-09-11 DIAGNOSIS — Z5189 Encounter for other specified aftercare: Secondary | ICD-10-CM

## 2016-09-11 DIAGNOSIS — C50311 Malignant neoplasm of lower-inner quadrant of right female breast: Secondary | ICD-10-CM | POA: Diagnosis not present

## 2016-09-11 MED ORDER — PEGFILGRASTIM INJECTION 6 MG/0.6ML ~~LOC~~
6.0000 mg | PREFILLED_SYRINGE | Freq: Once | SUBCUTANEOUS | Status: AC
  Administered 2016-09-11: 6 mg via SUBCUTANEOUS
  Filled 2016-09-11: qty 0.6

## 2016-09-11 MED ORDER — PEGFILGRASTIM 6 MG/0.6ML ~~LOC~~ PSKT: 6.0000 mg | PREFILLED_SYRINGE | Freq: Once | SUBCUTANEOUS | Status: DC

## 2016-09-11 NOTE — Patient Instructions (Signed)
Pegfilgrastim injection What is this medicine? PEGFILGRASTIM (PEG fil gra stim) is a long-acting granulocyte colony-stimulating factor that stimulates the growth of neutrophils, a type of white blood cell important in the body's fight against infection. It is used to reduce the incidence of fever and infection in patients with certain types of cancer who are receiving chemotherapy that affects the bone marrow, and to increase survival after being exposed to high doses of radiation. This medicine may be used for other purposes; ask your health care provider or pharmacist if you have questions. What should I tell my health care provider before I take this medicine? They need to know if you have any of these conditions: -kidney disease -latex allergy -ongoing radiation therapy -sickle cell disease -skin reactions to acrylic adhesives (On-Body Injector only) -an unusual or allergic reaction to pegfilgrastim, filgrastim, other medicines, foods, dyes, or preservatives -pregnant or trying to get pregnant -breast-feeding How should I use this medicine? This medicine is for injection under the skin. If you get this medicine at home, you will be taught how to prepare and give the pre-filled syringe or how to use the On-body Injector. Refer to the patient Instructions for Use for detailed instructions. Use exactly as directed. Take your medicine at regular intervals. Do not take your medicine more often than directed. It is important that you put your used needles and syringes in a special sharps container. Do not put them in a trash can. If you do not have a sharps container, call your pharmacist or healthcare provider to get one. Talk to your pediatrician regarding the use of this medicine in children. While this drug may be prescribed for selected conditions, precautions do apply. Overdosage: If you think you have taken too much of this medicine contact a poison control center or emergency room at  once. NOTE: This medicine is only for you. Do not share this medicine with others. What if I miss a dose? It is important not to miss your dose. Call your doctor or health care professional if you miss your dose. If you miss a dose due to an On-body Injector failure or leakage, a new dose should be administered as soon as possible using a single prefilled syringe for manual use. What may interact with this medicine? Interactions have not been studied. Give your health care provider a list of all the medicines, herbs, non-prescription drugs, or dietary supplements you use. Also tell them if you smoke, drink alcohol, or use illegal drugs. Some items may interact with your medicine. This list may not describe all possible interactions. Give your health care provider a list of all the medicines, herbs, non-prescription drugs, or dietary supplements you use. Also tell them if you smoke, drink alcohol, or use illegal drugs. Some items may interact with your medicine. What should I watch for while using this medicine? You may need blood work done while you are taking this medicine. If you are going to need a MRI, CT scan, or other procedure, tell your doctor that you are using this medicine (On-Body Injector only). What side effects may I notice from receiving this medicine? Side effects that you should report to your doctor or health care professional as soon as possible: -allergic reactions like skin rash, itching or hives, swelling of the face, lips, or tongue -dizziness -fever -pain, redness, or irritation at site where injected -pinpoint red spots on the skin -red or dark-brown urine -shortness of breath or breathing problems -stomach or side pain, or pain   at the shoulder -swelling -tiredness -trouble passing urine or change in the amount of urine Side effects that usually do not require medical attention (report to your doctor or health care professional if they continue or are  bothersome): -bone pain -muscle pain This list may not describe all possible side effects. Call your doctor for medical advice about side effects. You may report side effects to FDA at 1-800-FDA-1088. Where should I keep my medicine? Keep out of the reach of children. Store pre-filled syringes in a refrigerator between 2 and 8 degrees C (36 and 46 degrees F). Do not freeze. Keep in carton to protect from light. Throw away this medicine if it is left out of the refrigerator for more than 48 hours. Throw away any unused medicine after the expiration date. NOTE: This sheet is a summary. It may not cover all possible information. If you have questions about this medicine, talk to your doctor, pharmacist, or health care provider.    2016, [employer]vier/Gold Standard. (2014-12-28 14:30:14)  

## 2016-09-12 ENCOUNTER — Ambulatory Visit: Payer: Medicare PPO | Admitting: Physical Therapy

## 2016-09-12 ENCOUNTER — Encounter: Payer: Self-pay | Admitting: Hematology

## 2016-09-12 DIAGNOSIS — M25611 Stiffness of right shoulder, not elsewhere classified: Secondary | ICD-10-CM | POA: Diagnosis not present

## 2016-09-12 DIAGNOSIS — R2231 Localized swelling, mass and lump, right upper limb: Secondary | ICD-10-CM

## 2016-09-12 DIAGNOSIS — M6281 Muscle weakness (generalized): Secondary | ICD-10-CM

## 2016-09-12 NOTE — Therapy (Signed)
[place], [place], [address] Phone: [phone]   Fax:  [phone]  Physical Therapy Treatment  Patient Details  Name: Tiffany Arnold MRN: [id number] Date of Birth: 01/01/1941 Referring Provider: Dr. Dalbert Batman   Encounter Date: 09/12/2016      PT End of Session - 09/12/16 1201    Visit Number 4   Number of Visits 9   Date for PT Re-Evaluation 09/29/16   PT Start Time 1104   PT Stop Time 1152   PT Time Calculation (min) 48 min   Activity Tolerance Patient tolerated treatment well   Behavior During Therapy [facility] for tasks assessed/performed      Past Medical History:  Diagnosis Date  . Allergy   . Arthritis   . Asthma   . Cancer ([place]) 07/29/2016   right breast  . Cataract of both eyes   . Chicken pox   . Colon polyps   . Dog bite of index finger 07/23/2016   Left index finger  . GERD (gastroesophageal reflux disease)   . Hyperlipidemia   . Hypertension   . IBS (irritable bowel syndrome)   . Phlebitis   . Pneumonia    hx of several yrs ago  . Shortness of breath dyspnea    with exertion only  . Sleep apnea    wears CPAP machine nightly    Past Surgical History:  Procedure Laterality Date  . BACK SURGERY     lower  . BREAST CYST EXCISION Right 2001   negative  . BREAST LUMPECTOMY WITH NEEDLE LOCALIZATION AND AXILLARY LYMPH NODE DISSECTION Right 07/29/2016   Procedure: RIGHT BREAST LUMPECTOMY WITH DOUBLE NEEDLE LOCALIZATION AND COMPLETE RIGHT AXILLARY LYMPH NODE DISSECTION;  Surgeon: Fanny Skates, MD;  Location: [place];  Service: General;  Laterality: Right;  . BREAST SURGERY Left 2000   Biopsy  . BUNIONECTOMY Bilateral 1998   great toe fusion on right foot  . COLONOSCOPY W/ POLYPECTOMY    . HERNIA REPAIR  [id number]   [place] SINUS SURGERY  2015  . PORTACATH PLACEMENT N/A 09/05/2016   Procedure: INSERTION PORT-A-CATH;  Surgeon: Fanny Skates, MD;  [facility];   Service: General;  Laterality: N/A;  . REPLACEMENT TOTAL KNEE Bilateral 2007  . TOTAL SHOULDER REPLACEMENT Left 2007    There were no vitals filed for this visit.      Subjective Assessment - 09/12/16 1107    Subjective Patient had a port placed on 09/05/16 and just started chemo on 09/09/16. Says she is feeling okay today. States she doesn't think she is supposed to be doing much because of the recent port placement. Reports she has occasional stinging below her right axilla.   Pertinent History Right breast lumpectomy with 16 lymph nodes removed on July 29, 2016. Has had bilateral knee replacements within the past 5 years. Had left shoulder replaced    Patient Stated Goals to get rid of the stinging in her arm    Currently in Pain? No/denies            [facility] PT Assessment - 09/12/16 0001      AROM   Right Shoulder Flexion 137 Degrees   Right Shoulder ABduction 143 Degrees                     [facility] Adult PT Treatment/Exercise - 09/12/16 0001      Shoulder Exercises: Pulleys   Flexion 3 minutes   Flexion  Limitations VC and tactile cues to decrease scapular compensations   ABduction 3 minutes   ABduction Limitations Tactile and VC to decrease scapular compensations.     Shoulder Exercises: Therapy Ball   Flexion 10 reps     Shoulder Exercises: ROM/Strengthening   Other ROM/Strengthening Exercises Finger Ladder Rt UE abduction up to 5 times (up to 20) with tactile cuing to decrease scapular compensations     Manual Therapy   Myofascial Release To Rt axilla during P/ROM and UE pulling into abduction to tolerance   Passive ROM In Supine to Rt shoulder into flexion, abduction and er to pts tolerance   Neural Stretch To Rt UE to tolerance                        Long Term Clinic Goals - 09/12/16 1128      CC Long Term Goal  #1   Title Patient with verbalize an understanding of lymphedema risk reduction precautions   Time 4   Period Weeks    Status On-going     CC Long Term Goal  #2   Title Patient will be independent in a home exercise program for range of motion and strength so that she can carry through at home    Time 4   Period Weeks   Status On-going     CC Long Term Goal  #3   Title Patient will report a decrease in pain by 50% so they can perform daily activities with greater ease   Baseline no change yet 09/04/16; 60% improvement on 09/12/16   Time 4   Period Weeks   Status Achieved     CC Long Term Goal  #4   Title Patient will decrease the DASH score to <60   to demonstrate increased functional use of upper extremity   Time 4   Period Weeks   Status On-going     CC Long Term Goal  #5   Title Patient will improve right  shoulder abduction to 155 degrees so that she can achieve position needed for radiation therapy.   Baseline 114 degrees 09/04/16; 143 degrees on 09/12/16   Time 4   Period Weeks   Status Revised            Plan - 09/12/16 1201    Clinical Impression Statement Patient presents toay with having a port placed a week ago and having begun chemotherapy 3 days ago. Patient is doing well with her AROM with patient having already achieved her right shoulder abduction AROM goal, so therapist updated this goal. She states she is feeling okay today and did not have any pain with her exercises and stretching. Patient did state she had some swelling in her right hand today, but her doctor said this was normal.   Rehab Potential Good   Clinical Impairments Affecting Rehab Potential previous total joint replacements in other joints with generalized weakness, obesity; caution of left total shoulder replacement    PT Frequency 2x / week   PT Duration 4 weeks   PT Treatment/Interventions ADLs/Self Care Home Management;Therapeutic activities;Therapeutic exercise;Taping;Manual lymph drainage;Neuromuscular re-education;DME Instruction;Patient/family education;Passive range of motion;Cognitive remediation   PT  Next Visit Plan Assess swelling in right arm; progress HEP, maybe supine scapular series with yellow theraband. Cont A/AA/P/ROM of Rt shoulder ROM, active shoulder work esepcially with right scapular muscles.    Consulted and Agree with Plan of Care Patient      Patient will benefit  from skilled therapeutic intervention in order to improve the following deficits and impairments:  Decreased range of motion, Obesity, Impaired UE functional use, Decreased endurance, Decreased skin integrity, Pain, Impaired perceived functional ability, Decreased activity tolerance, Decreased knowledge of use of DME, Postural dysfunction, Decreased strength  Visit Diagnosis: Stiffness of right shoulder, not elsewhere classified  Localized swelling, mass and lump, right upper limb  Muscle weakness (generalized)     Problem List Patient Active Problem List   Diagnosis Date Noted  . Breast cancer of lower-inner quadrant of right female breast ([place]) 06/27/2016  . OSA on CPAP 06/26/2016  . Type 2 diabetes mellitus without complication ([place]) [id number]  . Severe obesity (BMI >= 40) ([place]) 09/04/2014  . Essential hypertension 09/04/2014  . HLD (hyperlipidemia) 09/04/2014  . Gastroesophageal reflux disease without esophagitis 09/04/2014  . Asthma, mild persistent 09/04/2014  . Urge incontinence 09/04/2014  . Seasonal allergies 09/04/2014  . Generalized anxiety disorder 09/04/2014    Mellody Life 09/12/2016, 12:06 PM  [place], [place], [address] Phone: [phone]   Fax:  [phone]  Name: Tiffany Arnold MRN: [id number] Date of Birth: June 12, 1941  Saverio Danker, SPT  This entire session was guided, instructed, and directly supervised by Serafina Royals, PT.  Read, reviewed, edited and agree with student's findings and recommendations.   Serafina Royals, PT 09/12/16 12:56 PM

## 2016-09-12 NOTE — Progress Notes (Signed)
Pt's sister Tiffany Arnold) returned my call to discuss copay assistance since she is the person in charge of pt's finances.  I informed her of the Patient Tiffany Arnold and applied in the pt's behalf.Pt is approved for $5000 to cover drugs associated w/ her Dx for 12 months from 09/12/16 w/ a 6 month look back period. [id number] is guaranteed, $3350 is accessible on a first-come first-served basis as long as funding is available. I emailed a copy of the approval letter &POE to Tiffany Arnold in billing & gave a copy to HIM to scan in pt's chart.  Informed her of the [place] &went over what it will cover. She will make sure pt brings her proof of income on her next visit.

## 2016-09-15 ENCOUNTER — Telehealth: Payer: Self-pay | Admitting: *Deleted

## 2016-09-15 NOTE — Telephone Encounter (Signed)
  Oncology Nurse Navigator Documentation  Navigator Location: [facility] (09/15/16 1600) Navigator Encounter Type: Telephone (09/15/16 1600) Telephone: Lahoma Crocker Call;Appt Confirmation/Clarification (09/15/16 1600)                                        Time Spent with Patient: 30 (09/15/16 1600)

## 2016-09-16 ENCOUNTER — Other Ambulatory Visit (HOSPITAL_BASED_OUTPATIENT_CLINIC_OR_DEPARTMENT_OTHER): Payer: Medicare PPO

## 2016-09-16 ENCOUNTER — Encounter: Payer: Self-pay | Admitting: Hematology

## 2016-09-16 ENCOUNTER — Ambulatory Visit (HOSPITAL_BASED_OUTPATIENT_CLINIC_OR_DEPARTMENT_OTHER): Payer: Medicare PPO | Admitting: Nurse Practitioner

## 2016-09-16 ENCOUNTER — Ambulatory Visit: Payer: Medicare PPO | Admitting: Physical Therapy

## 2016-09-16 ENCOUNTER — Other Ambulatory Visit: Payer: Self-pay

## 2016-09-16 DIAGNOSIS — R2231 Localized swelling, mass and lump, right upper limb: Secondary | ICD-10-CM | POA: Diagnosis not present

## 2016-09-16 DIAGNOSIS — M25611 Stiffness of right shoulder, not elsewhere classified: Secondary | ICD-10-CM

## 2016-09-16 DIAGNOSIS — C50311 Malignant neoplasm of lower-inner quadrant of right female breast: Secondary | ICD-10-CM

## 2016-09-16 DIAGNOSIS — M6281 Muscle weakness (generalized): Secondary | ICD-10-CM | POA: Diagnosis not present

## 2016-09-16 DIAGNOSIS — K1230 Oral mucositis (ulcerative), unspecified: Secondary | ICD-10-CM

## 2016-09-16 LAB — CBC WITH DIFFERENTIAL/PLATELET
BASO%: 0.6 % (ref 0.0–2.0)
Basophils Absolute: 0.1 10*3/uL (ref 0.0–0.1)
EOS ABS: 0.1 10*3/uL (ref 0.0–0.5)
EOS%: 0.5 % (ref 0.0–7.0)
HCT: 39.5 % (ref 34.8–46.6)
HGB: 12.9 g/dL (ref 11.6–15.9)
LYMPH%: 14.2 % (ref 14.0–49.7)
MCH: 28.8 pg (ref 25.1–34.0)
MCHC: 32.6 g/dL (ref 31.5–36.0)
MCV: 88.2 fL (ref 79.5–101.0)
MONO#: 2.3 10*3/uL — ABNORMAL HIGH (ref 0.1–0.9)
MONO%: 10.2 % (ref 0.0–14.0)
NEUT%: 74.5 % (ref 38.4–76.8)
NEUTROS ABS: 16.7 10*3/uL — AB (ref 1.5–6.5)
Platelets: 233 10*3/uL (ref 145–400)
RBC: 4.47 10*6/uL (ref 3.70–5.45)
RDW: 13.4 % (ref 11.2–14.5)
WBC: 22.5 10*3/uL — AB (ref 3.9–10.3)
lymph#: 3.2 10*3/uL (ref 0.9–3.3)

## 2016-09-16 LAB — COMPREHENSIVE METABOLIC PANEL
ALK PHOS: 112 U/L (ref 40–150)
ALT: 22 U/L (ref 0–55)
AST: 17 U/L (ref 5–34)
Albumin: 2.9 g/dL — ABNORMAL LOW (ref 3.5–5.0)
Anion Gap: 12 mEq/L — ABNORMAL HIGH (ref 3–11)
BILIRUBIN TOTAL: 0.44 mg/dL (ref 0.20–1.20)
BUN: 17 mg/dL (ref 7.0–26.0)
CO2: 26 mEq/L (ref 22–29)
Calcium: 9.9 mg/dL (ref 8.4–10.4)
Chloride: 99 mEq/L (ref 98–109)
Creatinine: 1.1 mg/dL (ref 0.6–1.1)
EGFR: 52 mL/min/{1.73_m2} — AB (ref >90)
Glucose: 150 mg/dl — ABNORMAL HIGH (ref 70–140)
POTASSIUM: 4.2 meq/L (ref 3.5–5.1)
SODIUM: 137 meq/L (ref 136–145)
Total Protein: 6.4 g/dL (ref 6.4–8.3)

## 2016-09-16 MED ORDER — MAGIC MOUTHWASH: ORAL | 1 refills | Status: DC

## 2016-09-16 MED FILL — MAGIC MW (NYS,BEN,MAAL): 4 days supply | Qty: 180 | Fill #0

## 2016-09-16 NOTE — Progress Notes (Signed)
Pt is approved for the $1000 [employer] grant.  

## 2016-09-16 NOTE — Progress Notes (Signed)
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 There are abnormal right axillary lymph nodes showing second cortical's, no evidence of malignancy in the left breast.     07/29/2016 Surgery    Right lumpectomy and ALND     07/29/2016 Pathology Results    Right lumpectomy showed G3 IDC, DCIS, margins (-), LVI(-).  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 She completed cycle 1 Taxotere/Cytoxan 09/09/2016. She had mild nausea. No vomiting. Her mouth feels "raw". She has not noticed any discrete ulcers. She occasionally notes a sore throat. She is able to eat and drink without difficulty. She had some loose stools after eating prunes.  Objective:  Vital signs in last 24 hours:  There were no vitals taken for this visit.    HEENT: No thrush or ulcers. Resp: Lungs clear bilaterally. Cardio: Regular rate and rhythm. GI: Abdomen soft and nontender. No hepatomegaly. Vascular: Trace low leg edema bilaterally. Skin: No rash. Port-A-Cath without erythema. Resolving ecchymosis superior to the Port-A-Cath.    Lab Results:  Lab Results  Component Value Date   WBC 22.5 (H) 09/16/2016   HGB 12.9 09/16/2016   HCT 39.5 09/16/2016   MCV 88.2 09/16/2016   PLT 233 09/16/2016   NEUTROABS 16.7 (H) 09/16/2016    Imaging:  No results found.  Medications: I have reviewed the patient's current medications.  Assessment/Plan: 1. 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 Status post cycle 1 Taxotere/Cytoxan 09/09/2016 2. Hypertension and arthritis  3. Morbid obesity     Disposition: Tiffany Arnold appears stable. She has completed 1 cycle of Taxotere/Cytoxan. Overall she appears to tolerated the first cycle well. She has mouth discomfort which may be developing mucositis. We will send a prescription to her pharmacy for Magic mouthwash. She understands to contact the office if she is unable to maintain adequate hydration due to the mouth discomfort.  She will return for a follow-up visit and cycle 2 Taxotere/Cytoxan in 2  weeks. She will contact the office in the interim as outlined above or with any other problems.    Ned Card ANP/GNP-BC   09/16/2016  12:51 PM

## 2016-09-16 NOTE — Therapy (Signed)
[place], [place], [address] Phone: [phone]   Fax:  [phone]  Physical Therapy Treatment  Patient Details  Name: Tiffany Arnold MRN: [id number] Date of Birth: November 08, 1941 Referring Provider: Dr. Dalbert Batman   Encounter Date: 09/16/2016      PT End of Session - 09/16/16 1709    Visit Number 5   Number of Visits 9   Date for PT Re-Evaluation 09/29/16   PT Start Time [id number]   PT Stop Time 1430   PT Time Calculation (min) 45 min   Activity Tolerance Patient tolerated treatment well   Behavior During Therapy [facility] for tasks assessed/performed      Past Medical History:  Diagnosis Date  . Allergy   . Arthritis   . Asthma   . Cancer ([place]) 07/29/2016   right breast  . Cataract of both eyes   . Chicken pox   . Colon polyps   . Dog bite of index finger 07/23/2016   Left index finger  . GERD (gastroesophageal reflux disease)   . Hyperlipidemia   . Hypertension   . IBS (irritable bowel syndrome)   . Phlebitis   . Pneumonia    hx of several yrs ago  . Shortness of breath dyspnea    with exertion only  . Sleep apnea    wears CPAP machine nightly    Past Surgical History:  Procedure Laterality Date  . BACK SURGERY     lower  . BREAST CYST EXCISION Right 2001   negative  . BREAST LUMPECTOMY WITH NEEDLE LOCALIZATION AND AXILLARY LYMPH NODE DISSECTION Right 07/29/2016   Procedure: RIGHT BREAST LUMPECTOMY WITH DOUBLE NEEDLE LOCALIZATION AND COMPLETE RIGHT AXILLARY LYMPH NODE DISSECTION;  Surgeon: Fanny Skates, MD;  Location: [place];  Service: General;  Laterality: Right;  . BREAST SURGERY Left 2000   Biopsy  . BUNIONECTOMY Bilateral 1998   great toe fusion on right foot  . COLONOSCOPY W/ POLYPECTOMY    . HERNIA REPAIR  [id number]   [place] SINUS SURGERY  2015  . PORTACATH PLACEMENT N/A 09/05/2016   Procedure: INSERTION PORT-A-CATH;  Surgeon: Fanny Skates, MD;  [facility];   Service: General;  Laterality: N/A;  . REPLACEMENT TOTAL KNEE Bilateral 2007  . TOTAL SHOULDER REPLACEMENT Left 2007    There were no vitals filed for this visit.      Subjective Assessment - 09/16/16 1351    Subjective Pt had an appointment at the cancer center today.  She has had one infusion of chemotherapy this week and will have another in 2 weeks. She will have a total of 4 infusions.  She says the swelling in her hand is better today    Pertinent History Right breast lumpectomy with 16 lymph nodes removed on July 29, 2016. Has had bilateral knee replacements within the past 5 years. Had left shoulder replaced    Patient Stated Goals to get rid of the stinging in her arm    Currently in Pain? No/denies            [facility] PT Assessment - 09/16/16 0001      AROM   Right Shoulder Flexion 150 Degrees   Right Shoulder ABduction 155 Degrees                     [facility] Adult PT Treatment/Exercise - 09/16/16 0001      Elbow Exercises   Elbow Flexion Strengthening;Right;20 reps  Bar Weights/Barbell (Elbow Flexion) 1 lb     Shoulder Exercises: Supine   Protraction AROM;10 reps   Protraction Limitations pt with difficulty coordinating this movement,  needed to stop and rest after 5 reps, then did 5 more    Flexion AAROM;Both;10 reps   Other Supine Exercises practice "radiation position stretch"  with folded towel under elbow.      Shoulder Exercises: Seated   Abduction AROM;Right;5 reps   ABduction Limitations pt able to self correct to try to keep shoulder down      Shoulder Exercises: Pulleys   Flexion 1 minute   ABduction 1 minute     Shoulder Exercises: ROM/Strengthening   Wall Wash --  with towel for flexion, scaption and abduction    Other ROM/Strengthening Exercises Finger Ladder Rt UE abduction up to 5 times (up to 20) with tactile cuing to decrease scapular compensations                        Long Term Clinic Goals - 09/16/16 1712       CC Long Term Goal  #1   Title Patient with verbalize an understanding of lymphedema risk reduction precautions   Status On-going     CC Long Term Goal  #2   Title Patient will be independent in a home exercise program for range of motion and strength so that she can carry through at home    Status On-going     CC Long Term Goal  #3   Title Patient will report a decrease in pain by 50% so they can perform daily activities with greater ease   Status Achieved     CC Long Term Goal  #4   Title Patient will decrease the DASH score to <60   to demonstrate increased functional use of upper extremity   Status On-going     CC Long Term Goal  #5   Title Patient will improve right  shoulder abduction to 155 degrees so that she can achieve position needed for radiation therapy.   Baseline 114 degrees 09/04/16; 143 degrees on 09/12/16  155 on 09/16/2016   Status Achieved     Additional Goals   Additional Goals --            Plan - 09/16/16 1709    Clinical Impression Statement Pt continues to improve with range of motion and is ready to progress to shoulder strengthening. Encouraged pt to sign up for ABC class to work toward that goal    Rehab Potential Good   Clinical Impairments Affecting Rehab Potential previous total joint replacements in other joints with generalized weakness, obesity; caution of left total shoulder replacement    PT Frequency 2x / week   PT Duration 4 weeks   PT Treatment/Interventions ADLs/Self Care Home Management;Therapeutic activities;Therapeutic exercise;Taping;Manual lymph drainage;Neuromuscular re-education;DME Instruction;Patient/family education;Passive range of motion;Cognitive remediation   PT Next Visit Plan  supine scapular series with yellow theraband. supine scapular protraction. standing deltoid isometrics    Consulted and Agree with Plan of Care Patient      Patient will benefit from skilled therapeutic intervention in order to improve the  following deficits and impairments:  Decreased range of motion, Obesity, Impaired UE functional use, Decreased endurance, Decreased skin integrity, Pain, Impaired perceived functional ability, Decreased activity tolerance, Decreased knowledge of use of DME, Postural dysfunction, Decreased strength  Visit Diagnosis: Stiffness of right shoulder, not elsewhere classified  Localized swelling, mass and lump, right  upper limb  Muscle weakness (generalized)     Problem List Patient Active Problem List   Diagnosis Date Noted  . 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 OSA on CPAP 06/26/2016  . Type 2 diabetes mellitus without complication ([place]) [id number]  . Severe obesity (BMI >= 40) ([place]) 09/04/2014  . Essential hypertension 09/04/2014  . HLD (hyperlipidemia) 09/04/2014  . Gastroesophageal reflux disease without esophagitis 09/04/2014  . Asthma, mild persistent 09/04/2014  . Urge incontinence 09/04/2014  . Seasonal allergies 09/04/2014  . Generalized anxiety disorder 09/04/2014   Donato Heinz. Owens Shark PT   Norwood Levo 09/16/2016, 5:15 PM  [facility] [place], [place], [address] Phone: [phone]   Fax:  [phone]  Name: Tiffany Arnold MRN: [id number] Date of Birth: [birth year]

## 2016-09-18 ENCOUNTER — Ambulatory Visit: Payer: Medicare PPO | Admitting: Physical Therapy

## 2016-09-18 DIAGNOSIS — M25611 Stiffness of right shoulder, not elsewhere classified: Secondary | ICD-10-CM

## 2016-09-18 DIAGNOSIS — M6281 Muscle weakness (generalized): Secondary | ICD-10-CM

## 2016-09-18 DIAGNOSIS — R2231 Localized swelling, mass and lump, right upper limb: Secondary | ICD-10-CM

## 2016-09-18 NOTE — Therapy (Signed)
[facility] [place], [place], [address] Phone: [phone]   Fax:  [phone]  Physical Therapy Treatment  Patient Details  Name: Tiffany Arnold MRN: [id number] Date of Birth: 11-Feb-1941 Referring Provider: Dr. Dalbert Batman   Encounter Date: 09/18/2016      PT End of Session - 09/18/16 1633    Visit Number 6   Number of Visits 9   Date for PT Re-Evaluation 09/29/16   PT Start Time 1347   PT Stop Time 1430   PT Time Calculation (min) 43 min   Activity Tolerance Patient tolerated treatment well   Behavior During Therapy [facility] for tasks assessed/performed      Past Medical History:  Diagnosis Date  . Allergy   . Arthritis   . Asthma   . Cancer ([place]) 07/29/2016   right breast  . Cataract of both eyes   . Chicken pox   . Colon polyps   . Dog bite of index finger 07/23/2016   Left index finger  . GERD (gastroesophageal reflux disease)   . Hyperlipidemia   . Hypertension   . IBS (irritable bowel syndrome)   . Phlebitis   . Pneumonia    hx of several yrs ago  . Shortness of breath dyspnea    with exertion only  . Sleep apnea    wears CPAP machine nightly    Past Surgical History:  Procedure Laterality Date  . BACK SURGERY     lower  . BREAST CYST EXCISION Right 2001   negative  . BREAST LUMPECTOMY WITH NEEDLE LOCALIZATION AND AXILLARY LYMPH NODE DISSECTION Right 07/29/2016   Procedure: RIGHT BREAST LUMPECTOMY WITH DOUBLE NEEDLE LOCALIZATION AND COMPLETE RIGHT AXILLARY LYMPH NODE DISSECTION;  Surgeon: Fanny Skates, MD;  Location: [place] [facility];  Service: General;  Laterality: Right;  . BREAST SURGERY Left 2000   Biopsy  . BUNIONECTOMY Bilateral 1998   great toe fusion on right foot  . COLONOSCOPY W/ POLYPECTOMY    . HERNIA REPAIR  [id number]   [place] SINUS SURGERY  2015  . PORTACATH PLACEMENT N/A 09/05/2016   Procedure: INSERTION PORT-A-CATH;  Surgeon: Fanny Skates, MD;  Location[facility];   Service: General;  Laterality: N/A;  . REPLACEMENT TOTAL KNEE Bilateral 2007  . TOTAL SHOULDER REPLACEMENT Left 2007    There were no vitals filed for this visit.      Subjective Assessment - 09/18/16 1354    Subjective Pt states she is doing ok " this is a very good day!" :-)    Pertinent History Right breast lumpectomy with 16 lymph nodes removed on July 29, 2016. Has had bilateral knee replacements within the past 5 years. Had left shoulder replaced    Patient Stated Goals to get rid of the stinging in her arm    Currently in Pain? No/denies   Pain Location Nose               LYMPHEDEMA/ONCOLOGY QUESTIONNAIRE - 09/18/16 1413      Right Upper Extremity Lymphedema   15 cm Proximal to Olecranon Process 44 cm   10 cm Proximal to Olecranon Process 44 cm   Olecranon Process 31 cm   15 cm Proximal to Ulnar Styloid Process 31.7 cm   10 cm Proximal to Ulnar Styloid Process 29 cm   Just Proximal to Ulnar Styloid Process 17.3 cm   Across Hand at [employer] 19.4 cm   At [place] of 2nd Digit 6.3 cm  [facility] Adult PT Treatment/Exercise - 09/18/16 0001      Shoulder Exercises: Standing   Other Standing Exercises isometrics for shoulder flexion, abduction and extension      Shoulder Exercises: Pulleys   Flexion 2 minutes   ABduction 2 minutes     Manual Therapy   Manual Lymphatic Drainage (MLD) briefly to right shoulder lateral arm elbow, forearm with return along pathways    Passive ROM In Supine to Rt shoulder into flexion, abduction and er to pts tolerance                PT Education - 09/18/16 1632    Education provided Yes   Education Details shoulder isometric exercise    Person(s) Educated Patient   Methods Explanation;Demonstration;Handout   Comprehension Verbalized understanding;Returned demonstration                [facility] Goals - 09/16/16 1712      CC Long Term Goal  #1   Title Patient with verbalize an  understanding of lymphedema risk reduction precautions   Status On-going     CC Long Term Goal  #2   Title Patient will be independent in a home exercise program for range of motion and strength so that she can carry through at home    Status On-going     CC Long Term Goal  #3   Title Patient will report a decrease in pain by 50% so they can perform daily activities with greater ease   Status Achieved     CC Long Term Goal  #4   Title Patient will decrease the DASH score to <60   to demonstrate increased functional use of upper extremity   Status On-going     CC Long Term Goal  #5   Title Patient will improve right  shoulder abduction to 155 degrees so that she can achieve position needed for radiation therapy.   Baseline 114 degrees 09/04/16; 143 degrees on 09/12/16  155 on 09/16/2016   Status Achieved     Additional Goals   Additional Goals --            Plan - 09/18/16 1633    Clinical Impression Statement pt has made good progress with shoulder range of motion and is beginning strengthening.  Circumferential measurements today show an increase so began manual lymph draiange.  Pt will consider bandaging,. She will likely have difficutly with sleeve donning due to arm size and previous left shoulder problems.  She will also have financial restrictions but may have help from [facility].  would benefit from discussion with team about proceeding with lymphedema treatment    Rehab Potential Good   Clinical Impairments Affecting Rehab Potential previous total joint replacements in other joints with generalized weakness, obesity; caution of left total shoulder replacement    PT Treatment/Interventions ADLs/Self Care Home Management;Therapeutic activities;Therapeutic exercise;Taping;Manual lymph drainage;Neuromuscular re-education;DME Instruction;Patient/family education;Passive range of motion;Cognitive remediation   PT Next Visit Plan  supine scapular series with yellow theraband. supine  scapular protraction.  assess isometrics.  manual lymph drainage with appication of tg soft.  consider bandaging and Check schedule to see if pt can increase treatement frequency.  Discuss at weds meetings to determing course of lymphedema treatment.    Consulted and Agree with Plan of Care Patient      Patient will benefit from skilled therapeutic intervention in order to improve the following deficits and impairments:  Decreased range of motion, Obesity, Impaired UE functional use,  Decreased endurance, Decreased skin integrity, Pain, Impaired perceived functional ability, Decreased activity tolerance, Decreased knowledge of use of DME, Postural dysfunction, Decreased strength  Visit Diagnosis: Stiffness of right shoulder, not elsewhere classified  Localized swelling, mass and lump, right upper limb  Muscle weakness (generalized)     Problem List Patient Active Problem List   Diagnosis Date Noted  . Breast cancer of lower-inner quadrant of right female breast ([place]) 06/27/2016  . OSA on CPAP 06/26/2016  . Type 2 diabetes mellitus without complication ([place]) 123XX123  . Severe obesity (BMI >= 40) ([place]) 09/04/2014  . Essential hypertension 09/04/2014  . HLD (hyperlipidemia) 09/04/2014  . Gastroesophageal reflux disease without esophagitis 09/04/2014  . Asthma, mild persistent 09/04/2014  . Urge incontinence 09/04/2014  . Seasonal allergies 09/04/2014  . Generalized anxiety disorder 09/04/2014   Donato Heinz. Owens Shark PT  Norwood Levo 09/18/2016, 4:40 PM  [place], [place], [address] Phone: [phone]   Fax:  [phone]  Name: Tiffany Arnold MRN: [id number] Date of Birth: [birth year]

## 2016-09-22 ENCOUNTER — Telehealth: Payer: Self-pay | Admitting: Internal Medicine

## 2016-09-22 ENCOUNTER — Ambulatory Visit: Payer: Medicare PPO | Attending: General Surgery

## 2016-09-22 DIAGNOSIS — R222 Localized swelling, mass and lump, trunk: Secondary | ICD-10-CM | POA: Diagnosis not present

## 2016-09-22 DIAGNOSIS — M25611 Stiffness of right shoulder, not elsewhere classified: Secondary | ICD-10-CM | POA: Diagnosis not present

## 2016-09-22 DIAGNOSIS — M6281 Muscle weakness (generalized): Secondary | ICD-10-CM | POA: Diagnosis not present

## 2016-09-22 DIAGNOSIS — R2231 Localized swelling, mass and lump, right upper limb: Secondary | ICD-10-CM | POA: Diagnosis not present

## 2016-09-22 DIAGNOSIS — I890 Lymphedema, not elsewhere classified: Secondary | ICD-10-CM | POA: Diagnosis not present

## 2016-09-22 NOTE — Therapy (Signed)
[place], [place], [address] Phone: [phone]   Fax:  [phone]  Physical Therapy Treatment  Patient Details  Name: Tiffany Arnold MRN: [id number] Date of Birth: 1941-01-24 Referring Provider: Dr. Dalbert Batman   Encounter Date: 09/22/2016      PT End of Session - 09/22/16 1108    Visit Number 7   Number of Visits 9   Date for PT Re-Evaluation 09/29/16   PT Start Time 1013   PT Stop Time 1106   PT Time Calculation (min) 53 min   Activity Tolerance Patient tolerated treatment well   Behavior During Therapy [facility] for tasks assessed/performed      Past Medical History:  Diagnosis Date  . Allergy   . Arthritis   . Asthma   . Cancer ([place]) 07/29/2016   right breast  . Cataract of both eyes   . Chicken pox   . Colon polyps   . Dog bite of index finger 07/23/2016   Left index finger  . GERD (gastroesophageal reflux disease)   . Hyperlipidemia   . Hypertension   . IBS (irritable bowel syndrome)   . Phlebitis   . Pneumonia    hx of several yrs ago  . Shortness of breath dyspnea    with exertion only  . Sleep apnea    wears CPAP machine nightly    Past Surgical History:  Procedure Laterality Date  . BACK SURGERY     lower  . BREAST CYST EXCISION Right 2001   negative  . BREAST LUMPECTOMY WITH NEEDLE LOCALIZATION AND AXILLARY LYMPH NODE DISSECTION Right 07/29/2016   Procedure: RIGHT BREAST LUMPECTOMY WITH DOUBLE NEEDLE LOCALIZATION AND COMPLETE RIGHT AXILLARY LYMPH NODE DISSECTION;  Surgeon: Fanny Skates, MD;  Location: [facility];  Service: General;  Laterality: Right;  . BREAST SURGERY Left 2000   Biopsy  . BUNIONECTOMY Bilateral 1998   great toe fusion on right foot  . COLONOSCOPY W/ POLYPECTOMY    . HERNIA REPAIR  [id number]   [place] SINUS SURGERY  2015  . PORTACATH PLACEMENT N/A 09/05/2016   Procedure: INSERTION PORT-A-CATH;  Surgeon: Fanny Skates, MD;  [facility];   Service: General;  Laterality: N/A;  . REPLACEMENT TOTAL KNEE Bilateral 2007  . TOTAL SHOULDER REPLACEMENT Left 2007    There were no vitals filed for this visit.      Subjective Assessment - 09/22/16 1016    Subjective Both of my shoulders are a little sore today but I haven't been good about my exercises this weekend. I had a few days last week where I felt almost like my old self and it was wonderful!   Pertinent History Right breast lumpectomy with 16 lymph nodes removed on July 29, 2016. Has had bilateral knee replacements within the past 5 years. Had left shoulder replaced    Patient Stated Goals to get rid of the stinging in her arm    Currently in Pain? No/denies            [facility] PT Assessment - 09/22/16 0001      AROM   Right Shoulder Flexion 155 Degrees   Right Shoulder ABduction 155 Degrees                     [facility] Adult PT Treatment/Exercise - 09/22/16 0001      Shoulder Exercises: Supine   Horizontal ABduction Strengthening;Both;10 reps;Theraband   Theraband Level (Shoulder Horizontal ABduction) Level 1 (  Yellow)   External Rotation Strengthening;Both;10 reps;Theraband   Theraband Level (Shoulder External Rotation) Level 1 (Yellow)   Flexion Strengthening;Both;5 reps;Theraband  Narrow and Wide grip 5 times each   Theraband Level (Shoulder Flexion) Level 1 (Yellow)   Other Supine Exercises Bil D2 with yellow theraband 10 times each side with pt returning therapist demo for all supine scapular series exs.     Shoulder Exercises: Pulleys   Flexion 2 minutes   ABduction 2 minutes     Shoulder Exercises: Therapy Ball   Flexion Other (comment)  Pt felt like she was having a "catch"  after 2x so stopped     Manual Therapy   Manual therapy comments Issued TG soft medium for Rt UE   Manual Lymphatic Drainage (MLD) In Supine: Short neck, superficial and 5 diaphragmatic breaths, Rt inguinal nodes and Rt axillo-inguinal anastomosis, Lt axillary nodes  and anterior inter-axillary anastomosis, and Rt UE from dorsal hand to lateral shoulder working proximal from distal then retracing all steps.                PT Education - 09/22/16 1037    Education provided Yes   Education Details Supine scapular series with yellow theraband   Person(s) Educated Patient   Methods Explanation;Demonstration;Handout   Comprehension Verbalized understanding;Returned demonstration;Need further instruction                [facility] Goals - 09/16/16 1712      CC Long Term Goal  #1   Title Patient with verbalize an understanding of lymphedema risk reduction precautions   Status On-going     CC Long Term Goal  #2   Title Patient will be independent in a home exercise program for range of motion and strength so that she can carry through at home    Status On-going     CC Long Term Goal  #3   Title Patient will report a decrease in pain by 50% so they can perform daily activities with greater ease   Status Achieved     CC Long Term Goal  #4   Title Patient will decrease the DASH score to <60   to demonstrate increased functional use of upper extremity   Status On-going     CC Long Term Goal  #5   Title Patient will improve right  shoulder abduction to 155 degrees so that she can achieve position needed for radiation therapy.   Baseline 114 degrees 09/04/16; 143 degrees on 09/12/16  155 on 09/16/2016   Status Achieved     Additional Goals   Additional Goals --            Plan - 09/22/16 1109    Clinical Impression Statement Pt continues with good progress tolerating therapy well and noting improvements at home and with ADLs like being able to tolerate activites longer without UE fatigue. Per her report her Rt UE continues to fluctuate with swelling and is willing to address this issue through bandaging if we deem necessary and she is able to come 3x/week if we go this route.    Rehab Potential Good   Clinical Impairments  Affecting Rehab Potential previous total joint replacements in other joints with generalized weakness, obesity; caution of left total shoulder replacement    PT Frequency 2x / week   PT Duration 4 weeks   PT Treatment/Interventions ADLs/Self Care Home Management;Therapeutic activities;Therapeutic exercise;Taping;Manual lymph drainage;Neuromuscular re-education;DME Instruction;Patient/family education;Passive range of motion;Cognitive remediation   PT Next  Visit Plan Assess goals next visit. Review supine scapular series with yellow theraband; try supine scapular protraction, assess isometrics prn, cont manual lymph drainage with application of tg soft (issued med today).  consider bandaging and Check schedule to see if pt can increase treatement frequency.  Discuss at wed meetings to determing course of lymphedema treatment.    Recommended Other Services Pt to attend ABC class today, 09/22/16   Consulted and Agree with Plan of Care Patient      Patient will benefit from skilled therapeutic intervention in order to improve the following deficits and impairments:  Decreased range of motion, Obesity, Impaired UE functional use, Decreased endurance, Decreased skin integrity, Pain, Impaired perceived functional ability, Decreased activity tolerance, Decreased knowledge of use of DME, Postural dysfunction, Decreased strength  Visit Diagnosis: Stiffness of right shoulder, not elsewhere classified  Localized swelling, mass and lump, right upper limb  Muscle weakness (generalized)     Problem List Patient Active Problem List   Diagnosis Date Noted  . Breast cancer of lower-inner quadrant of right female breast ([place]) 06/27/2016  . OSA on CPAP 06/26/2016  . Type 2 diabetes mellitus without complication ([place]) 123XX123  . Severe obesity (BMI >= 40) ([place]) 09/04/2014  . Essential hypertension 09/04/2014  . HLD (hyperlipidemia) 09/04/2014  . Gastroesophageal reflux disease without esophagitis  09/04/2014  . Asthma, mild persistent 09/04/2014  . Urge incontinence 09/04/2014  . Seasonal allergies 09/04/2014  . Generalized anxiety disorder 09/04/2014    Otelia Limes, PTA 09/22/2016, 11:50 AM  [place], [place], [address] Phone: [phone]   Fax:  [phone]  Name: Tiffany Arnold MRN: [id number] Date of Birth: 03-03-1941

## 2016-09-22 NOTE — Telephone Encounter (Signed)
Pt sister calling stating they never got a call about scheduling patient a sleep study Please advise.

## 2016-09-22 NOTE — Telephone Encounter (Signed)
Spoke with patient and sister.  Advised that pt is able to have a HST and insurance has approved HST.  Advised patient that I would be able to set patient up at the [facility] hopefully next week, once equipment is completed being installed.   Pt stated that she preferred to get this done this week. Rodena Piety contacted patient from the [facility] and set an appointment up for this Thurs 09/25/16. Pt and sister aware. Nothing else is needed at this time. Rhonda J Cobb

## 2016-09-22 NOTE — Telephone Encounter (Signed)
Will you please look in to this. They state they haven't been contacted for sleep study. Thanks.

## 2016-09-22 NOTE — Patient Instructions (Signed)
Over Head Pull: Narrow and Wide Grip     Cancer Rehab [phone]   On back, knees bent, feet flat, band across thighs, elbows straight but relaxed. Pull hands apart (start). Keeping elbows straight, bring arms up and over head, hands toward floor. Keep pull steady on band. Hold momentarily. Return slowly, keeping pull steady, back to start. Then do with a wider grip.  Repeat _10__ times. Band color _yellow_____   Side Pull: Double Arm   On back, knees bent, feet flat. Arms perpendicular to body, shoulder level, elbows straight but relaxed. Pull arms out to sides, elbows straight. Resistance band comes across collarbones, hands toward floor. Hold momentarily. Slowly return to starting position. Repeat _10__ times. Band color _yellow____   Sword   On back, knees bent, feet flat, left hand on left hip, right hand above left. Pull right arm DIAGONALLY (hip to shoulder) across chest. Bring right arm along head toward floor. Hold momentarily. Slowly return to starting position. Repeat _10__ times. Do with left arm. Band color _yellow_____   Shoulder Rotation: Double Arm   On back, knees bent, feet flat, elbows tucked at sides, bent 90, hands palms up. Pull hands apart and down toward floor, keeping elbows near sides. Hold momentarily. Slowly return to starting position. Repeat _10__ times. Band color _yellow_____

## 2016-09-23 ENCOUNTER — Ambulatory Visit: Payer: Medicare PPO | Admitting: Physical Therapy

## 2016-09-24 ENCOUNTER — Other Ambulatory Visit: Payer: Self-pay | Admitting: Internal Medicine

## 2016-09-24 NOTE — Telephone Encounter (Signed)
Last filled 08/26/16--please advise

## 2016-09-24 NOTE — Telephone Encounter (Signed)
Ok to phone in Xanax 

## 2016-09-25 ENCOUNTER — Ambulatory Visit: Payer: Medicare PPO | Admitting: Physical Therapy

## 2016-09-25 DIAGNOSIS — R2231 Localized swelling, mass and lump, right upper limb: Secondary | ICD-10-CM | POA: Diagnosis not present

## 2016-09-25 DIAGNOSIS — M25611 Stiffness of right shoulder, not elsewhere classified: Secondary | ICD-10-CM | POA: Diagnosis not present

## 2016-09-25 DIAGNOSIS — M6281 Muscle weakness (generalized): Secondary | ICD-10-CM

## 2016-09-25 DIAGNOSIS — R222 Localized swelling, mass and lump, trunk: Secondary | ICD-10-CM | POA: Diagnosis not present

## 2016-09-25 DIAGNOSIS — G4733 Obstructive sleep apnea (adult) (pediatric): Secondary | ICD-10-CM | POA: Diagnosis not present

## 2016-09-25 DIAGNOSIS — I890 Lymphedema, not elsewhere classified: Secondary | ICD-10-CM

## 2016-09-25 NOTE — Telephone Encounter (Signed)
Rx called in to pharmacy. 

## 2016-09-25 NOTE — Therapy (Signed)
[place], [place], [address] Phone: [phone]   Fax:  [phone]  Physical Therapy Treatment  Patient Details  Name: Tiffany Arnold MRN: [id number] Date of Birth: 21-Mar-1941 Referring Provider: Dr. Dalbert Batman  Encounter Date: 09/25/2016      PT End of Session - 09/25/16 1410    Visit Number 8   Number of Visits 21   Date for PT Re-Evaluation 10/30/16   PT Start Time 1300   PT Stop Time 1345   PT Time Calculation (min) 45 min   Activity Tolerance Patient tolerated treatment well   Behavior During Therapy [facility] for tasks assessed/performed      Past Medical History:  Diagnosis Date  . Allergy   . Arthritis   . Asthma   . Cancer ([place]) 07/29/2016   right breast  . Cataract of both eyes   . Chicken pox   . Colon polyps   . Dog bite of index finger 07/23/2016   Left index finger  . GERD (gastroesophageal reflux disease)   . Hyperlipidemia   . Hypertension   . IBS (irritable bowel syndrome)   . Phlebitis   . Pneumonia    hx of several yrs ago  . Shortness of breath dyspnea    with exertion only  . Sleep apnea    wears CPAP machine nightly    Past Surgical History:  Procedure Laterality Date  . BACK SURGERY     lower  . BREAST CYST EXCISION Right 2001   negative  . BREAST LUMPECTOMY WITH NEEDLE LOCALIZATION AND AXILLARY LYMPH NODE DISSECTION Right 07/29/2016   Procedure: RIGHT BREAST LUMPECTOMY WITH DOUBLE NEEDLE LOCALIZATION AND COMPLETE RIGHT AXILLARY LYMPH NODE DISSECTION;  Surgeon: Fanny Skates, MD;  Location: [place];  Service: General;  Laterality: Right;  . BREAST SURGERY Left 2000   Biopsy  . BUNIONECTOMY Bilateral 1998   great toe fusion on right foot  . COLONOSCOPY W/ POLYPECTOMY    . HERNIA REPAIR  [id number]   [place] SINUS SURGERY  2015  . PORTACATH PLACEMENT N/A 09/05/2016   Procedure: INSERTION PORT-A-CATH;  Surgeon: Fanny Skates, MD;  [facility];   Service: General;  Laterality: N/A;  . REPLACEMENT TOTAL KNEE Bilateral 2007  . TOTAL SHOULDER REPLACEMENT Left 2007    There were no vitals filed for this visit.      Subjective Assessment - 09/25/16 1306    Subjective pt has been wearing the tg soft and is concerned that her arm is swelling more She says that last night her fingers really swelled up.  Pt needs more information about bandaging.  She says her hair has been falling out.  She said she fell on a theshold going into a restaurant this past up.  Pt states she no longer has the stinging in her arm, though it still is a little numb     Pertinent History Right breast lumpectomy with 16 lymph nodes removed on July 29, 2016. Has had bilateral knee replacements within the past 5 years. Had left shoulder replaced    Patient Stated Goals to get rid of the stinging in her arm (pt states this goal has been met 09/25/2016)    Currently in Pain? No/denies            [facility] PT Assessment - 09/25/16 0001      Assessment   Medical Diagnosis breast cancer   Referring Provider Dr. Dalbert Batman   Onset Date/Surgical Date  07/29/16     Observation/Other Assessments   Observations fullness observed at right lateral chest and more fullness observed in arm  It is not red or pink or tender to palpation.  Her arm feels slightly warm, but the other one feels warm also.    Quick DASH  22.73           LYMPHEDEMA/ONCOLOGY QUESTIONNAIRE - 09/25/16 1313      Right Upper Extremity Lymphedema   15 cm Proximal to Olecranon Process 45 cm   10 cm Proximal to Olecranon Process 44 cm   Olecranon Process 32 cm   15 cm Proximal to Ulnar Styloid Process 33 cm   10 cm Proximal to Ulnar Styloid Process 31 cm   Just Proximal to Ulnar Styloid Process 19 cm   Across Hand at [employer] 20 cm   At [place] of 2nd Digit 6.5 cm           Quick Dash - 09/25/16 0001    Open a tight or new jar Moderate difficulty   Do heavy household chores (wash walls, wash  floors) Moderate difficulty   Carry a shopping bag or briefcase Mild difficulty   Wash your back Mild difficulty   Use a knife to cut food No difficulty   Recreational activities in which you take some force or impact through your arm, shoulder, or hand (golf, hammering, tennis) Mild difficulty   During the past week, to what extent has your arm, shoulder or hand problem interfered with your normal social activities with family, friends, neighbors, or groups? Slightly   During the past week, to what extent has your arm, shoulder or hand problem limited your work or other regular daily activities Slightly   Arm, shoulder, or hand pain. None   Tingling (pins and needles) in your arm, shoulder, or hand Mild   Difficulty Sleeping No difficulty   DASH Score 22.73 %               [facility] Adult PT Treatment/Exercise - 09/25/16 0001      Self-Care   Self-Care Other Self-Care Comments   Other Self-Care Comments  began discussion about compression bra for more breast support  and lateral chest compression      Manual Therapy   Manual Lymphatic Drainage (MLD) In Supine: Short neck, superficial and 5 diaphragmatic breaths, Rt inguinal nodes and Rt axillo-inguinal anastomosis, Lt axillary nodes and anterior inter-axillary anastomosis, and Rt UE from dorsal hand to lateral shoulder working proximal from distal then retracing all steps. then to sidelying for posterior interaxillary anastamosis and lateral chest                 PT Education - 09/25/16 1409    Education provided Yes   Education Details continue home exercise, do not wear tg soft at this time.  Will start bandaging if ok'd by dr. Paulino Rily) Educated Patient   Methods Explanation;Demonstration   Comprehension Verbalized understanding                [facility] Goals - 09/25/16 1310      CC Long Term Goal  #1   Title Patient with verbalize an understanding of lymphedema risk reduction precautions    Baseline attended ABC class on 09/22/2016   Status Achieved     CC Long Term Goal  #2   Title Patient will be independent in a home exercise program for range of motion and strength so  that she can carry through at home    Status On-going     [place] Term Goal  #3   Title Patient will report a decrease in pain by 50% so they can perform daily activities with greater ease   Baseline no change yet 09/04/16; 60% improvement on 09/12/16, Pt is a "whole lot better "  Pt says she is not having pain  09/25/2016   Status Achieved     CC Long Term Goal  #4   Title Patient will decrease the DASH score to <60   to demonstrate increased functional use of upper extremity   Baseline 84.09 on eval, 22.73 on 09/22/2016   Status Achieved     CC Long Term Goal  #5   Title Patient will improve right  shoulder abduction to 155 degrees so that she can achieve position needed for radiation therapy.   Baseline 114 degrees 09/04/16; 143 degrees on 09/12/16  155 on 09/16/2016   Status Achieved     CC Long Term Goal  #6   Title Patient will have a circumferential reduction of     2      cm at   10   cm above the   right       ulnar styloid    Baseline 31 on 09/25/2016   Time 4   Period Weeks   Status New     Additional Goals   Additional Goals Yes            Plan - 09/25/16 1411    Clinical Impression Statement Pt has acheived her goals for ROM and function of arm, but has had increases in circumferential measurements of right arm and has fullness in right lateral trunk and breast.  She needs to progress to complete decongestive therapy with manual lymph drainage and compression bandaging with progression to compression garments.  Have sent [facility] to [facility] for insuance coverage check for garments,or pt may have financial assist from [place].    Rehab Potential Good   Clinical Impairments Affecting Rehab Potential previous total joint replacements in other joints with generalized weakness, obesity;  caution of left total shoulder replacement  16 lymph nodes removed from right axilla    PT Frequency 3x / week   PT Duration 4 weeks   PT Treatment/Interventions ADLs/Self Care Home Management;Therapeutic activities;Therapeutic exercise;Taping;Manual lymph drainage;DME Instruction;Patient/family education;Passive range of motion;Compression bandaging;Scar mobilization;Manual techniques   PT Next Visit Plan Check for order from Dr. Dalbert Batman for compression bandaging. If here, perform manual lymph drainage and compression bandaging. review supine scapular exercise and remedical range of motion and make sure pt is following up at home with exercise to address that goal    Recommended Other Services attended ABC class on 09/22/2016   Consulted and Agree with Plan of Care Patient      Patient will benefit from skilled therapeutic intervention in order to improve the following deficits and impairments:  Obesity, Increased edema, Decreased knowledge of use of DME, Decreased knowledge of precautions, Decreased strength, Postural dysfunction, Impaired UE functional use  Visit Diagnosis: Lymphedema, not elsewhere classified - Plan: PT plan of care cert/re-cert  Localized swelling, mass and lump, trunk - Plan: PT plan of care cert/re-cert  Muscle weakness (generalized) - Plan: PT plan of care cert/re-cert     Problem List Patient Active Problem List   Diagnosis Date Noted  . Breast cancer of lower-inner quadrant of right female breast ([place]) 06/27/2016  . OSA on CPAP  06/26/2016  . Type 2 diabetes mellitus without complication ([place]) 29/84/7308  . Severe obesity (BMI >= 40) ([place]) 09/04/2014  . Essential hypertension 09/04/2014  . HLD (hyperlipidemia) 09/04/2014  . Gastroesophageal reflux disease without esophagitis 09/04/2014  . Asthma, mild persistent 09/04/2014  . Urge incontinence 09/04/2014  . Seasonal allergies 09/04/2014  . Generalized anxiety disorder 09/04/2014   Donato Heinz. Owens Shark  PT  Norwood Levo 09/25/2016, 2:25 PM  [place], [place], [address] Phone: [phone]   Fax:  [phone]  Name: TAKEILA THAYNE MRN: [id number] Date of Birth: 04-11-1941

## 2016-09-29 ENCOUNTER — Ambulatory Visit: Payer: Medicare PPO

## 2016-09-29 ENCOUNTER — Telehealth: Payer: Self-pay | Admitting: *Deleted

## 2016-09-29 ENCOUNTER — Encounter: Payer: Self-pay | Admitting: *Deleted

## 2016-09-29 DIAGNOSIS — M25611 Stiffness of right shoulder, not elsewhere classified: Secondary | ICD-10-CM

## 2016-09-29 DIAGNOSIS — R222 Localized swelling, mass and lump, trunk: Secondary | ICD-10-CM | POA: Diagnosis not present

## 2016-09-29 DIAGNOSIS — I890 Lymphedema, not elsewhere classified: Secondary | ICD-10-CM

## 2016-09-29 DIAGNOSIS — R2231 Localized swelling, mass and lump, right upper limb: Secondary | ICD-10-CM

## 2016-09-29 DIAGNOSIS — M6281 Muscle weakness (generalized): Secondary | ICD-10-CM | POA: Diagnosis not present

## 2016-09-29 NOTE — Telephone Encounter (Signed)
FYI, could this patient receive a calendar for at home treatment medication use.  Thanks.    Patient called "asking if dexamethasone is needed for tomorrow's treatment.  Do I still wait to take the nausea medication.  I have two of them." Reviewed how to take dexamethasone, zofran and prochlorperazine per EPIC orders.

## 2016-09-29 NOTE — Therapy (Signed)
[place], [place], [address] Phone: [phone]   Fax:  [phone]  Physical Therapy Treatment  Patient Details  Name: Tiffany Arnold MRN: [id number] Date of Birth: 1941-05-17 Referring Provider: Dr. Dalbert Batman  Encounter Date: 09/29/2016      PT End of Session - 09/29/16 1020    Visit Number 9   Number of Visits 21   Date for PT Re-Evaluation 10/30/16   PT Start Time 0932   PT Stop Time 1019   PT Time Calculation (min) 47 min   Activity Tolerance Patient tolerated treatment well   Behavior During Therapy [facility] for tasks assessed/performed      Past Medical History:  Diagnosis Date  . Allergy   . Arthritis   . Asthma   . Cancer ([place]) 07/29/2016   right breast  . Cataract of both eyes   . Chicken pox   . Colon polyps   . Dog bite of index finger 07/23/2016   Left index finger  . GERD (gastroesophageal reflux disease)   . Hyperlipidemia   . Hypertension   . IBS (irritable bowel syndrome)   . Phlebitis   . Pneumonia    hx of several yrs ago  . Shortness of breath dyspnea    with exertion only  . Sleep apnea    wears CPAP machine nightly    Past Surgical History:  Procedure Laterality Date  . BACK SURGERY     lower  . BREAST CYST EXCISION Right 2001   negative  . BREAST LUMPECTOMY WITH NEEDLE LOCALIZATION AND AXILLARY LYMPH NODE DISSECTION Right 07/29/2016   Procedure: RIGHT BREAST LUMPECTOMY WITH DOUBLE NEEDLE LOCALIZATION AND COMPLETE RIGHT AXILLARY LYMPH NODE DISSECTION;  Surgeon: Fanny Skates, MD;  Location: [place] [facility];  Service: General;  Laterality: Right;  . BREAST SURGERY Left 2000   Biopsy  . BUNIONECTOMY Bilateral 1998   great toe fusion on right foot  . COLONOSCOPY W/ POLYPECTOMY    . HERNIA REPAIR  8546   [place] SINUS SURGERY  2015  . PORTACATH PLACEMENT N/A 09/05/2016   Procedure: INSERTION PORT-A-CATH;  Surgeon: Fanny Skates, MD;  [facility];   Service: General;  Laterality: N/A;  . REPLACEMENT TOTAL KNEE Bilateral 2007  . TOTAL SHOULDER REPLACEMENT Left 2007    There were no vitals filed for this visit.      Subjective Assessment - 09/29/16 0939    Subjective My Rt shoulder is really sore today just when I move it. I think it's from the weather but I also didn't do any of my exercises this weekend, I just didn't think about it. My swelling was a little improved after she worked on me last visit. I tried to do the massage on myself over the weekend and I thinkit helped some as well.    Pertinent History Right breast lumpectomy with 16 lymph nodes removed on July 29, 2016. Has had bilateral knee replacements within the past 5 years. Had left shoulder replaced    Patient Stated Goals to get rid of the stinging in her arm (pt states this goal has been met 09/25/2016)    Currently in Pain? No/denies                         [facility] Adult PT Treatment/Exercise - 09/29/16 0001      Shoulder Exercises: Pulleys   Flexion 3 minutes   ABduction 2 minutes  Manual Therapy   Manual Lymphatic Drainage (MLD) In Supine: Short neck, superficial and deep abdominals, Rt inguinal nodes and Rt axillo-inguinal anastomosis, Lt axillary nodes and anterior inter-axillary anastomosis, and Rt UE from dorsal hand to lateral shoulder working proximal from distal then retracing all steps.   Compression Bandaging Lotion applied to Rt UE, then stockinette, Elastomull to all fingers, Artiflex x1 and 1-6 , 2-10, and 1-12 cm short stretch compression bandages from hand to axilla.                         [facility] Goals - 09/25/16 1310      CC Long Term Goal  #1   Title Patient with verbalize an understanding of lymphedema risk reduction precautions   Baseline attended ABC class on 09/22/2016   Status Achieved     CC Long Term Goal  #2   Title Patient will be independent in a home exercise program for range of motion  and strength so that she can carry through at home    Status On-going     CC Long Term Goal  #3   Title Patient will report a decrease in pain by 50% so they can perform daily activities with greater ease   Baseline no change yet 09/04/16; 60% improvement on 09/12/16, Pt is a "whole lot better "  Pt says she is not having pain  09/25/2016   Status Achieved     CC Long Term Goal  #4   Title Patient will decrease the DASH score to <60   to demonstrate increased functional use of upper extremity   Baseline 84.09 on eval, 22.73 on 09/22/2016   Status Achieved     CC Long Term Goal  #5   Title Patient will improve right  shoulder abduction to 155 degrees so that she can achieve position needed for radiation therapy.   Baseline 114 degrees 09/04/16; 143 degrees on 09/12/16  155 on 09/16/2016   Status Achieved     CC Long Term Goal  #6   Title Patient will have a circumferential reduction of     2      cm at   10   cm above the   right       ulnar styloid    Baseline 31 on 09/25/2016   Time 4   Period Weeks   Status New     Additional Goals   Additional Goals Yes            Plan - 09/29/16 1021    Clinical Impression Statement Pt came in c/o of some increased Rt shoulder soreness with movement so warmed up with pulleys then continued with manual lymph drainage and had first session of compression bandaging which she reported felt good after session today with no pain or tingling.  Pt will benefit from continued therapy to address her lymphedema.    Rehab Potential Good   Clinical Impairments Affecting Rehab Potential previous total joint replacements in other joints with generalized weakness, obesity; caution of left total shoulder replacement  16 lymph nodes removed from right axilla    PT Frequency 3x / week   PT Duration 4 weeks   PT Treatment/Interventions ADLs/Self Care Home Management;Therapeutic activities;Therapeutic exercise;Taping;Manual lymph drainage;DME  Instruction;Patient/family education;Passive range of motion;Compression bandaging;Scar mobilization;Manual techniques   PT Next Visit Plan Cont CDT and assess how pt tolerated bandaging. Review supine scapular exercise and remedical range of motion and make  sure pt is following up at home with exercise to address that goal    Consulted and Agree with Plan of Care Patient      Patient will benefit from skilled therapeutic intervention in order to improve the following deficits and impairments:  Obesity, Increased edema, Decreased knowledge of use of DME, Decreased knowledge of precautions, Decreased strength, Postural dysfunction, Impaired UE functional use  Visit Diagnosis: Lymphedema, not elsewhere classified  Localized swelling, mass and lump, trunk  Muscle weakness (generalized)  Stiffness of right shoulder, not elsewhere classified  Localized swelling, mass and lump, right upper limb     Problem List Patient Active Problem List   Diagnosis Date Noted  . Breast cancer of lower-inner quadrant of right female breast ([place]) 06/27/2016  . OSA on CPAP 06/26/2016  . Type 2 diabetes mellitus without complication ([place]) 66/91/6756  . Severe obesity (BMI >= 40) ([place]) 09/04/2014  . Essential hypertension 09/04/2014  . HLD (hyperlipidemia) 09/04/2014  . Gastroesophageal reflux disease without esophagitis 09/04/2014  . Asthma, mild persistent 09/04/2014  . Urge incontinence 09/04/2014  . Seasonal allergies 09/04/2014  . Generalized anxiety disorder 09/04/2014    Otelia Limes, PTA 09/29/2016, 10:25 AM  [place], [place], [address] Phone: [phone]   Fax:  [phone]  Name: Tiffany Arnold MRN: [id number] Date of Birth: 01/02/1941

## 2016-09-30 ENCOUNTER — Encounter: Payer: Self-pay | Admitting: Hematology

## 2016-09-30 ENCOUNTER — Telehealth: Payer: Self-pay | Admitting: *Deleted

## 2016-09-30 ENCOUNTER — Ambulatory Visit (HOSPITAL_BASED_OUTPATIENT_CLINIC_OR_DEPARTMENT_OTHER): Payer: Medicare PPO | Admitting: Hematology

## 2016-09-30 ENCOUNTER — Ambulatory Visit: Payer: Medicare PPO

## 2016-09-30 ENCOUNTER — Encounter: Payer: Self-pay | Admitting: *Deleted

## 2016-09-30 ENCOUNTER — Telehealth: Payer: Self-pay | Admitting: Hematology

## 2016-09-30 ENCOUNTER — Encounter: Payer: Medicare PPO | Admitting: Physical Therapy

## 2016-09-30 ENCOUNTER — Ambulatory Visit (HOSPITAL_BASED_OUTPATIENT_CLINIC_OR_DEPARTMENT_OTHER): Payer: Medicare PPO

## 2016-09-30 ENCOUNTER — Other Ambulatory Visit (HOSPITAL_BASED_OUTPATIENT_CLINIC_OR_DEPARTMENT_OTHER): Payer: Medicare PPO

## 2016-09-30 VITALS — BP 121/73 | HR 69 | Temp 97.9°F | Resp 17 | Ht 60.0 in | Wt 219.2 lb

## 2016-09-30 DIAGNOSIS — C50311 Malignant neoplasm of lower-inner quadrant of right female breast: Secondary | ICD-10-CM

## 2016-09-30 DIAGNOSIS — Z9989 Dependence on other enabling machines and devices: Secondary | ICD-10-CM

## 2016-09-30 DIAGNOSIS — Z5111 Encounter for antineoplastic chemotherapy: Secondary | ICD-10-CM

## 2016-09-30 DIAGNOSIS — E1165 Type 2 diabetes mellitus with hyperglycemia: Secondary | ICD-10-CM | POA: Diagnosis not present

## 2016-09-30 DIAGNOSIS — Z170 Estrogen receptor positive status [ER+]: Principal | ICD-10-CM

## 2016-09-30 DIAGNOSIS — I10 Essential (primary) hypertension: Secondary | ICD-10-CM

## 2016-09-30 DIAGNOSIS — Z95828 Presence of other vascular implants and grafts: Secondary | ICD-10-CM

## 2016-09-30 DIAGNOSIS — I890 Lymphedema, not elsewhere classified: Secondary | ICD-10-CM | POA: Diagnosis not present

## 2016-09-30 DIAGNOSIS — E119 Type 2 diabetes mellitus without complications: Secondary | ICD-10-CM

## 2016-09-30 DIAGNOSIS — G4733 Obstructive sleep apnea (adult) (pediatric): Secondary | ICD-10-CM

## 2016-09-30 LAB — COMPREHENSIVE METABOLIC PANEL
ALT: 19 U/L (ref 0–55)
ANION GAP: 10 meq/L (ref 3–11)
AST: 15 U/L (ref 5–34)
Albumin: 3.5 g/dL (ref 3.5–5.0)
Alkaline Phosphatase: 71 U/L (ref 40–150)
BUN: 22.4 mg/dL (ref 7.0–26.0)
CHLORIDE: 102 meq/L (ref 98–109)
CO2: 25 meq/L (ref 22–29)
CREATININE: 0.8 mg/dL (ref 0.6–1.1)
Calcium: 10.1 mg/dL (ref 8.4–10.4)
EGFR: 78 mL/min/{1.73_m2} — ABNORMAL LOW (ref >90)
Glucose: 193 mg/dl — ABNORMAL HIGH (ref 70–140)
Potassium: 4 mEq/L (ref 3.5–5.1)
Sodium: 138 mEq/L (ref 136–145)
Total Bilirubin: 0.35 mg/dL (ref 0.20–1.20)
Total Protein: 6.8 g/dL (ref 6.4–8.3)

## 2016-09-30 LAB — CBC WITH DIFFERENTIAL/PLATELET
BASO%: 0.2 % (ref 0.0–2.0)
Basophils Absolute: 0 10*3/uL (ref 0.0–0.1)
EOS%: 0 % (ref 0.0–7.0)
Eosinophils Absolute: 0 10*3/uL (ref 0.0–0.5)
HEMATOCRIT: 37.9 % (ref 34.8–46.6)
HGB: 12.5 g/dL (ref 11.6–15.9)
LYMPH#: 1.6 10*3/uL (ref 0.9–3.3)
LYMPH%: 16 % (ref 14.0–49.7)
MCH: 29 pg (ref 25.1–34.0)
MCHC: 32.9 g/dL (ref 31.5–36.0)
MCV: 88 fL (ref 79.5–101.0)
MONO#: 0.3 10*3/uL (ref 0.1–0.9)
MONO%: 3.1 % (ref 0.0–14.0)
NEUT#: 8.1 10*3/uL — ABNORMAL HIGH (ref 1.5–6.5)
NEUT%: 80.7 % — ABNORMAL HIGH (ref 38.4–76.8)
PLATELETS: 513 10*3/uL — AB (ref 145–400)
RBC: 4.31 10*6/uL (ref 3.70–5.45)
RDW: 13.5 % (ref 11.2–14.5)
WBC: 10 10*3/uL (ref 3.9–10.3)

## 2016-09-30 MED ORDER — SODIUM CHLORIDE 0.9% FLUSH
10.0000 mL | INTRAVENOUS | Status: DC | PRN
  Administered 2016-09-30: 10 mL via INTRAVENOUS
  Filled 2016-09-30: qty 10

## 2016-09-30 MED ORDER — SODIUM CHLORIDE 0.9% FLUSH
10.0000 mL | INTRAVENOUS | Status: DC | PRN
  Administered 2016-09-30: 10 mL
  Filled 2016-09-30: qty 10

## 2016-09-30 MED ORDER — DEXAMETHASONE SODIUM PHOSPHATE 100 MG/10ML IJ SOLN
10.0000 mg | Freq: Once | INTRAMUSCULAR | Status: AC
  Administered 2016-09-30: 10 mg via INTRAVENOUS
  Filled 2016-09-30: qty 1

## 2016-09-30 MED ORDER — CYCLOPHOSPHAMIDE CHEMO INJECTION 1 GM
600.0000 mg/m2 | Freq: Once | INTRAMUSCULAR | Status: AC
  Administered 2016-09-30: 1300 mg via INTRAVENOUS
  Filled 2016-09-30: qty 65

## 2016-09-30 MED ORDER — SODIUM CHLORIDE 0.9 % IV SOLN
Freq: Once | INTRAVENOUS | Status: AC
  Administered 2016-09-30: 11:00:00 via INTRAVENOUS

## 2016-09-30 MED ORDER — HEPARIN SOD (PORK) LOCK FLUSH 100 UNIT/ML IV SOLN
500.0000 [IU] | Freq: Once | INTRAVENOUS | Status: AC | PRN
  Administered 2016-09-30: 500 [IU]
  Filled 2016-09-30: qty 5

## 2016-09-30 MED ORDER — PALONOSETRON HCL INJECTION 0.25 MG/5ML
INTRAVENOUS | Status: AC
  Filled 2016-09-30: qty 5

## 2016-09-30 MED ORDER — PALONOSETRON HCL INJECTION 0.25 MG/5ML
0.2500 mg | Freq: Once | INTRAVENOUS | Status: AC
  Administered 2016-09-30: 0.25 mg via INTRAVENOUS

## 2016-09-30 MED ORDER — SODIUM CHLORIDE 0.9 % IV SOLN
75.0000 mg/m2 | Freq: Once | INTRAVENOUS | Status: AC
  Administered 2016-09-30: 160 mg via INTRAVENOUS
  Filled 2016-09-30: qty 16

## 2016-09-30 NOTE — Patient Instructions (Signed)
[facility] Discharge Instructions for Patients Receiving Chemotherapy  Today you received the following chemotherapy agents Taxotere and Cytoxan.  To help prevent nausea and vomiting after your treatment, we encourage you to take your nausea medication as prescribed.   If you develop nausea and vomiting that is not controlled by your nausea medication, call the clinic.   BELOW ARE SYMPTOMS THAT SHOULD BE REPORTED IMMEDIATELY:  *FEVER GREATER THAN 100.5 F  *CHILLS WITH OR WITHOUT FEVER  NAUSEA AND VOMITING THAT IS NOT CONTROLLED WITH YOUR NAUSEA MEDICATION  *UNUSUAL SHORTNESS OF BREATH  *UNUSUAL BRUISING OR BLEEDING  TENDERNESS IN MOUTH AND THROAT WITH OR WITHOUT PRESENCE OF ULCERS  *URINARY PROBLEMS  *BOWEL PROBLEMS  UNUSUAL RASH Items with * indicate a potential emergency and should be followed up as soon as possible.  Feel free to call the clinic you have any questions or concerns. The clinic phone number is [phone].  Please show the CHEMO ALERT CARD at check-in to the Emergency Department and triage nurse.   

## 2016-09-30 NOTE — Progress Notes (Signed)
Tiffany Arnold  [phone] [phone]  Clinic Follow Up Note   Patient Care Team: [name], NP as PCP - General (Internal Medicine) 09/30/2016   CHIEF COMPLAINTS:  Follow Up right breast cancer  . Oncology History   Breast cancer of lower-inner quadrant of right female breast [facility])   Staging form: Breast, AJCC 7th Edition   - Clinical stage from 06/09/2016: Stage IIB (T2, N1, M0) - Signed by Truitt Merle, MD on 06/27/2016   - Pathologic stage from 07/29/2016: Stage IIA (T1c, N1a, cM0) - Signed by Truitt Merle, MD on 08/13/2016       Breast cancer of lower-inner quadrant of right female breast ([place])   05/23/2016 Mammogram    Diagnostic mammogram and ultrasound showed suspicious architectural distortion within the right breast lower inner quadrant, measuring 1.3 cm, without sonographic corelate.      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 There are abnormal right axillary lymph nodes showing second cortical's, no evidence of malignancy in the left breast.      07/29/2016 Surgery    Right lumpectomy and ALND      07/29/2016  Pathology Results    Right lumpectomy showed G3 IDC, DCIS, margins (-), LVI(-).  1 of 16 nodes was positive       07/29/2016 Miscellaneous    Mammaprint showed high risk disease, luminal type B      09/09/2016 -  Adjuvant Chemotherapy    Docetaxel and Cytoxan (TC) every 3 weeks       HISTORY OF PRESENTING ILLNESS:  Tiffany Arnold 75 y.o. female is here because of her recently diagnosed left breast cancer. She presents to my clinic with her friend, who is a Marine scientist.   Her cancer was discovered by screening mammogram. She had a right breast cyst in 2001, which was removed. She has been doing mammogram once a year. The mammogram and ultrasound on 05/23/2016 showed a suspicious architectural this portion, 1.3 cm, without sonographic correlation. She underwent core needle biopsy of the right breast mass twice and right axilla node biopsy, one breast biopsy and node biopsy showed invasive ductal carcinoma and DCIS, ER/PR strong positive, HER-2 negative.  She denies any other new symptoms. She has noticed mild fatigued lately, she still works full time in a vet's office, she has IBS, has intermittent constipation and diarrhea. She has arthritis, and both knee replacement and shoulder surgery before, she also has some back pain lately, she takes tylenol occasionally.  She lives with her husband, moderately active. No family history of breast cancer  GYN HISTORY  Menarchal: 13 LMP: 77 Contraceptive: 4-5 years HRT: 3 years  G2P2: no breast feeding, daughter 47 yo and son is 76 yo.   CURRENT THERAPY: Adjuvant chemotherapy with docetaxel and Cytoxan (TC) every 3 weeks, starting on 09/09/2069  INTERIM HISTORY: Tiffany Arnold returns for follow up and second cycle chemo. She tolerated first cycle chemo very well, she had mild diarrhea for a few days,  Resolved, mild nausea, no vomiting, or other side effects. She developed worsening right lymphedema a week ago, and she has Started physical therapy. Her  right arm and hand is wrapped, no other new complaints.  MEDICAL HISTORY:  Past Medical History:  Diagnosis Date  . Allergy   . Arthritis   . Asthma   . Cancer ([place]) 07/29/2016   right breast  . Cataract of both eyes   . Chicken pox   . Colon polyps   . Dog bite of index finger 07/23/2016   Left index finger  . GERD (gastroesophageal reflux disease)   . Hyperlipidemia   . Hypertension   . IBS (irritable bowel syndrome)   . Phlebitis   . Pneumonia    hx of several yrs ago  . Shortness of breath dyspnea    with exertion only  . Sleep apnea    wears CPAP machine nightly    SURGICAL HISTORY: Past Surgical History:  Procedure Laterality Date  . BACK SURGERY     lower  . BREAST CYST EXCISION Right 2001   negative  . BREAST LUMPECTOMY WITH NEEDLE LOCALIZATION AND AXILLARY LYMPH NODE DISSECTION Right 07/29/2016   Procedure: RIGHT BREAST LUMPECTOMY WITH DOUBLE NEEDLE LOCALIZATION AND COMPLETE RIGHT AXILLARY LYMPH NODE DISSECTION;  Surgeon: Fanny Skates, MD;  Location: [facility];  Service: General;  Laterality: Right;  . BREAST SURGERY Left 2000   Biopsy  . BUNIONECTOMY Bilateral 1998   great toe fusion on right foot  . COLONOSCOPY W/ POLYPECTOMY    . HERNIA REPAIR  1610   [place] SINUS SURGERY  2015  . PORTACATH PLACEMENT N/A 09/05/2016   Procedure: INSERTION PORT-A-CATH;  Surgeon: Fanny Skates, MD;  Location: [facility];  Service: General;  Laterality: N/A;  . REPLACEMENT TOTAL KNEE Bilateral 2007  . TOTAL SHOULDER REPLACEMENT Left 2007    SOCIAL HISTORY: Social History   Social History  . Marital status: Married    Spouse name: N/A  . Number of children: N/A  . Years of education: N/A   Occupational History  . Not on file.   Social History Main Topics  . Smoking status: Former Smoker    Packs/day: 2.00    Years: 25.00    Quit date: 12/22/1990  . Smokeless tobacco: Never Used     Comment: quit 24 years ago  . Alcohol use Yes     Comment: rare  . Drug  use: No  . Sexual activity: Not Currently   Other Topics Concern  . Not on file   Social History Narrative  . No narrative on file    FAMILY HISTORY: Family History  Problem Relation Age of Onset  . Arthritis Mother   . Stroke Mother   . Hypertension Mother   . Cancer Father     Prostate  . Stroke Father   . Hypertension Father   . Hypertension Maternal Grandmother   . Rheum arthritis Maternal Grandfather   . Stroke Maternal Grandfather   . Hypertension Maternal Grandfather   . Cancer Paternal Grandmother  Colon  . Hypertension Paternal Grandmother   . Hypertension Paternal Grandfather   . Breast cancer Neg Hx     ALLERGIES:  is allergic to other.  MEDICATIONS:  Current Outpatient Prescriptions  Medication Sig Dispense Refill  . albuterol (PROAIR HFA) 108 (90 BASE) MCG/ACT inhaler Inhale 2 puffs into the lungs every 6 (six) hours as needed for wheezing.     Marland Kitchen albuterol-ipratropium (COMBIVENT) 18-103 MCG/ACT inhaler Inhale 2 puffs into the lungs every 4 (four) hours as needed for wheezing or shortness of breath.     . ALPRAZolam (XANAX) 0.25 MG tablet TAKE ONE TABLET BY MOUTH TWICE DAILY AS NEEDED 60 tablet 0  . Calcium Carbonate-Vitamin D (CALCIUM-VITAMIN D) 500-200 MG-UNIT tablet Take 1 tablet by mouth daily.    . cetirizine (ZYRTEC) 10 MG tablet Take 10 mg by mouth daily.    Marland Kitchen dexamethasone (DECADRON) 4 MG tablet Take 1 tablet (4 mg total) by mouth 2 (two) times daily. Start the day before Taxotere. Then again the day after chemo for 3 days. 30 tablet 1  . glucosamine-chondroitin 500-400 MG tablet Take 1 tablet by mouth 2 (two) times daily.    Marland Kitchen HYDROcodone-acetaminophen (NORCO/VICODIN) 5-325 MG tablet Take 1-2 tablets by mouth every 4 (four) hours as needed. 50 tablet 0  . lidocaine-prilocaine (EMLA) cream Apply to port at least 1 hour prior to chemo 30 g 1  . magic mouthwash SOLN Swish and Spit 5-10 mLs 4 times a day as needed. 240 mL 1  . methocarbamol (ROBAXIN)  500 MG tablet Take 1 tablet (500 mg total) by mouth at bedtime as needed for muscle spasms. 10 tablet 0  . Olmesartan-Amlodipine-HCTZ 40-5-25 MG TABS TAKE ONE TABLET BY MOUTH ONCE DAILY 90 tablet 1  . ondansetron (ZOFRAN) 8 MG tablet Take 1 tablet (8 mg total) by mouth 2 (two) times daily as needed for refractory nausea / vomiting. Start on day 3 after chemo. 30 tablet 1  . oxybutynin (DITROPAN-XL) 10 MG 24 hr tablet Take 1 tablet (10 mg total) by mouth at bedtime. 30 tablet 5  . pravastatin (PRAVACHOL) 40 MG tablet TAKE ONE TABLET BY MOUTH ONCE DAILY 30 tablet 5  . prochlorperazine (COMPAZINE) 10 MG tablet Take 1 tablet (10 mg total) by mouth every 6 (six) hours as needed (Nausea or vomiting). 30 tablet 1  . ranitidine (ZANTAC) 150 MG tablet Take 150 mg by mouth every evening.     . vitamin B-12 (CYANOCOBALAMIN) 1000 MCG tablet Take 1,000 mcg by mouth daily.    Marland Kitchen EPINEPHrine 0.3 mg/0.3 mL IJ SOAJ injection Inject 0.3 mg into the muscle once as needed (ALLERGIC REACTION).     . Omega-3 Fatty Acids (FISH OIL) 1000 MG CAPS Take 1,000 mg by mouth daily.     No current facility-administered medications for this visit.     REVIEW OF SYSTEMS:   Constitutional: Denies fevers, chills or abnormal night sweats Eyes: Denies blurriness of vision, double vision or watery eyes Ears, nose, mouth, throat, and face: Denies mucositis or sore throat Respiratory: Denies cough, dyspnea or wheezes Cardiovascular: Denies palpitation, chest discomfort or lower extremity swelling Gastrointestinal:  Denies nausea, heartburn or change in bowel habits Skin: Denies abnormal skin rashes Lymphatics: Denies new lymphadenopathy or easy bruising Neurological:Denies numbness, tingling or new weaknesses Behavioral/Psych: Mood is stable, no new changes  All other systems were reviewed with the patient and are negative.  PHYSICAL EXAMINATION: ECOG PERFORMANCE STATUS: 1  Vitals:   09/30/16 0944  BP: 121/73  Pulse: 69    Resp: 17  Temp: 97.9 F (36.6 C)   Filed Weights   09/30/16 0944  Weight: 219 lb 3.2 oz (99.4 kg)    GENERAL:alert, no distress and comfortable SKIN: skin color, texture, turgor are normal, no rashes or significant lesions EYES: normal, conjunctiva are pink and non-injected, sclera clear OROPHARYNX:no exudate, no erythema and lips, buccal mucosa, and tongue normal  NECK: supple, thyroid normal size, non-tender, without nodularity LYMPH:  no palpable lymphadenopathy in the cervical, axillary or inguinal LUNGS: clear to auscultation and percussion with normal breathing effort HEART: regular rate & rhythm and no murmurs and no lower extremity edema ABDOMEN:abdomen soft, non-tender and normal bowel sounds Musculoskeletal:no cyanosis of digits and no clubbing  PSYCH: alert & oriented x 3 with fluent speech NEURO: no focal motor/sensory deficits Breasts: Breast inspection showed them to be symmetrical with no nipple discharge. (+) incision sites in right breast and axilla are healing well. Palpation of the breasts and axilla revealed no obvious mass that I could appreciate.   LABORATORY DATA:  I have reviewed the data as listed CBC Latest Ref Rng & Units 09/30/2016 09/16/2016 09/03/2016  WBC 3.9 - 10.3 10e3/uL 10.0 22.5(H) 7.4  Hemoglobin 11.6 - 15.9 g/dL 12.5 12.9 13.8  Hematocrit 34.8 - 46.6 % 37.9 39.5 42.0  Platelets 145 - 400 10e3/uL 513(H) 233 263   CMP Latest Ref Rng & Units 09/30/2016 09/16/2016 09/03/2016  Glucose 70 - 140 mg/dl 193(H) 150(H) 113  BUN 7.0 - 26.0 mg/dL 22.4 17.0 22.2  Creatinine 0.6 - 1.1 mg/dL 0.8 1.1 0.8  Sodium 136 - 145 mEq/L 138 137 140  Potassium 3.5 - 5.1 mEq/L 4.0 4.2 4.4  Chloride 101 - 111 mmol/L - - -  CO2 22 - 29 mEq/L 25 26 31(H)  Calcium 8.4 - 10.4 mg/dL 10.1 9.9 10.5(H)  Total Protein 6.4 - 8.3 g/dL 6.8 6.4 7.2  Total Bilirubin 0.20 - 1.20 mg/dL 0.35 0.44 0.40  Alkaline Phos 40 - 150 U/L 71 112 74  AST 5 - 34 U/L 15 17 15   ALT 0 - 55 U/L 19  22 16     PATHOLOGY REPORT  Diagnosis 06/03/2016 Breast, right, needle core biopsy, ILQ focal 1.3 cm asymmetry/distortion - ATYPICAL DUCTAL HYPERPLASIA WITH CALCIFICATIONS. - FIBROCYSTIC CHANGES WITH CALCIFICATIONS. - SEE COMMENT. Microscopic Comment The results were called to [facility] on 06/04/16. (JBK:ds 06/04/16)   Diagnosis 06/09/2016 Breast, right, needle core biopsy, inner - INVASIVE DUCTAL CARCINOMA. - DUCTAL CARCINOMA IN SITU. - SEE COMMENT. Microscopic Comment The carcinoma appears grade 1-2. A breast prognostic profile will be performed and the results reported separately. The results were called to [facility] on 06/10/2016. (JBK:ecj 06/10/2016) Results: HER2 - NEGATIVE RATIO OF HER2/CEP17 SIGNALS 1.55 AVERAGE HER2 COPY NUMBER PER CELL 2.25  Results: IMMUNOHISTOCHEMICAL AND MORPHOMETRIC ANALYSIS PERFORMED MANUALLY Estrogen Receptor: 100%, POSITIVE, STRONG STAINING INTENSITY Progesterone Receptor: 100%, POSITIVE, STRONG STAINING INTENSITY Proliferation Marker Ki67: 40%   Diagnosis 06/17/2016 Lymph node, needle/core biopsy, right, inferior, axilla to far lateral breast - METASTATIC CARCINOMA, SEE COMMENT. Microscopic Comment The morphology is consistent with the patient breast carcinoma. Prognostic markers will be ordered and reported in an addendum. The case was called to [facility] on 06/18/2016. Results: IMMUNOHISTOCHEMICAL AND MORPHOMETRIC ANALYSIS PERFORMED MANUALLY Estrogen Receptor: 100%, POSITIVE, STRONG STAINING INTENSITY Progesterone Receptor: 95%, POSITIVE, STRONG STAINING INTENSITY  Results: HER2 - NEGATIVE RATIO OF HER2/CEP17 SIGNALS 1.25 AVERAGE HER2 COPY NUMBER  PER CELL 2.00  Diagnosis 07/29/2016 1. Breast, lumpectomy, Right INVASIVE DUCTAL CARCINOMA, GRADE 3, SPANNING 1.5 CM DUCTAL CARCINOMA IN SITU IS PRESENT ALL MARGINS OF RESECTION ARE NEGATIVE FOR CARCINOMA 2. Lymph nodes, regional  resection, Right axillary contents METASTATIC BREAST DUCTAL CARCINOMA IN ONE OF SIXTEEN LYMPH NODES (1/16) Specimen, including laterality and lymph node sampling (sentinel, non-sentinel): Right partial breast and regional lymph nodes Procedure: Lumpectomy Histologic type: Ductal carcinoma Grade: 3 Tubule formation: 3 Nuclear pleomorphism: 2 Mitotic:3 Tumor size (gross measurement or glass slide measurement): 1.5 cm Margins: Invasive, distance to closest margin: 0.7 cm In-situ, distance to closest margin: 0.7 cm If margin positive, focally or broadly: NA Lymphovascular invasion: Not identified Ductal carcinoma in situ: Present Grade: 3 Extensive intraductal component: moderate Lobular neoplasia: Negative Tumor focality: Focal Treatment effect: Negative If present, treatment effect in breast tissue, lymph nodes or both: NA Extent of tumor: Skin: Negative Nipple: Negative Skeletal muscle: Negative Lymph nodes: Examined: 0 Sentinel 16 Non-sentinel 16 Total Lymph nodes with metastasis: 1 Isolated tumor cells (< 0.2 mm): 0 Micrometastasis: (> 0.2 mm and < 2.0 mm): 0 Macrometastasis: (> 2.0 mm): 1 Extracapsular extension: Present Breast prognostic profile: Estrogen receptor: 100% Progesterone receptor: 100% Her 2 neu: Negative Ki-67: 40% Non-neoplastic breast: Unremarkable TNM: pT1c, pN1  RADIOGRAPHIC STUDIES: I have personally reviewed the radiological images as listed and agreed with the findings in the report. Dg Chest Port 1 View  Result Date: 09/05/2016 CLINICAL DATA:  Port-A-Cath placement. History of right breast cancer. EXAM: PORTABLE CHEST 1 VIEW COMPARISON:  None. FINDINGS: 0832 hours. Right Port-A-Cath noted with tip position overlying the mid to distal SVC level. No evidence for pneumothorax. The cardio pericardial silhouette is enlarged. Interstitial markings are diffusely coarsened with chronic features. Patient is status post left shoulder replacement.  IMPRESSION: Tip of right Port-A-Cath overlies the mid to distal SVC level. No evidence for pneumothorax. Electronically Signed   By: Misty Stanley M.D.   On: 09/05/2016 09:08   Dg C-arm 1-60 Min-no Report  Result Date: 09/05/2016 CLINICAL DATA: portacath C-ARM 1-60 MINUTES Fluoroscopy was utilized by the requesting physician.  No radiographic interpretation.   Diagnostic mammogram and ultrasound of right breast including right axillary 05/23/2016 IMPRESSION: Suspicious architectural distortion within the lower inner quadrant of the right breast, measuring 1.3 cm greatest dimension, without sonographic correlate. This distortion could conceivably be related to the patient's earlier surgical excision biopsy perform in 2001 (patient states that this earlier surgical excision was in this same region of her right breast), however, exclusion of a neoplastic cause is needed.  As such, stereotactic-guided biopsy, with 3D tomosynthesis, is recommended for this suspicious finding.   ASSESSMENT & PLAN: 75 year old Caucasian female, with mammogram discovered right breast cancer.  1. 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  -We reviewed her mammaprint genomic test result, which showed luminal type B, high risk, average 10-year risk of recurrence without adjuvant therapy is 29% -given the high risk disease, I strongly recommend her to consider adjuvant chemo. She is relatively elderly, with some medical comorbilities, but still has good PS, so I recommend adjuvant docetaxel and cytoxan every 3 weeks for 4 cycles   -the goal of chemo is curative  -She tolerated first cycle chemotherapy  well, lab reviewed, adequate for treatment,  we'll proceed cycle 2 today.  2. Hyperglycemia -She had a borderline hyperglycemia before. Her random glucose today was 193, this is likely steroids induced hyperglycemia. -I strongly encouraged her follow-up with her primary care physician, she agrees to call as soon as possible. I think she probably need metformin to control her blood glucose. -She is on dexamethasone 8 mg daily for 4 days around her chemo  3. Right upper extremity lymphedema -She is quite significant right upper extremity lymphedema after the axillary node dissection -Continue physical therapy, and wear sleeves   4. Hypertension and arthritis -She will continue medication and follow-up with her primary care physician -We discussed her that chemotherapy may affect her oral intake and her blood pressure, she is on 3 blood pressure medications including HCTZ, I strongly encouraged her to monitor her blood pressure at home, and we may need to adjust her medication if her blood pressure drops.  5. Morbid obesity -I encouraged her to have healthy diet and exercise regularly, try to lose some weight.  Plan -second cycle TC today -Return to clinic in 3 weeks with lab, flush and chemotherapy -Continue physical therapy -She'll call her primary care physician to discuss her diabetic management  All questions were answered. The patient knows to call the clinic with any problems, questions or concerns. I spent 20 minutes counseling the patient face to face. The total time spent in the appointment was 25 minutes and more than 50% was on counseling.     Truitt Merle, MD 09/30/2016

## 2016-09-30 NOTE — Telephone Encounter (Signed)
Per LOS I have scheduled appt and notified the scheduler 

## 2016-09-30 NOTE — Telephone Encounter (Signed)
Message sent to chemo scheduler to add chemo. Avs report and appointment schedule given to patient, per 09/30/16 los. °

## 2016-10-01 ENCOUNTER — Ambulatory Visit: Payer: Medicare PPO

## 2016-10-01 ENCOUNTER — Telehealth: Payer: Self-pay

## 2016-10-01 ENCOUNTER — Encounter: Payer: Self-pay | Admitting: Hematology

## 2016-10-01 DIAGNOSIS — R222 Localized swelling, mass and lump, trunk: Secondary | ICD-10-CM | POA: Diagnosis not present

## 2016-10-01 DIAGNOSIS — R2231 Localized swelling, mass and lump, right upper limb: Secondary | ICD-10-CM | POA: Diagnosis not present

## 2016-10-01 DIAGNOSIS — M6281 Muscle weakness (generalized): Secondary | ICD-10-CM | POA: Diagnosis not present

## 2016-10-01 DIAGNOSIS — I890 Lymphedema, not elsewhere classified: Secondary | ICD-10-CM

## 2016-10-01 DIAGNOSIS — M25611 Stiffness of right shoulder, not elsewhere classified: Secondary | ICD-10-CM | POA: Diagnosis not present

## 2016-10-01 NOTE — Telephone Encounter (Signed)
Pt left v/m; pt got infusion from oncologist on 09/30/16 and pt will need to be on steroids for 5 days which will cause blood sugar level to go up. Pt was advised by oncologist to contact PCP to receive med to lower BS level while on steroids; pt has 2 more infusions scheduled and will need enough med to control BS for total of 21 days. Pt wants to start med to control BS today. Please advise. Pt is not on any meds for diabetes; BS has been borderline in past. [facility] [address]

## 2016-10-01 NOTE — Telephone Encounter (Signed)
Lysbeth Galas (DPR signed) left v/m with same information pt just left and Lysbeth Galas also request increase in nerve medication; pt is very anxious due to every thing going on with oncology.  I called back and spoke with [name] (DPR signed) who is with Lysbeth Galas at [facility] and Lysbeth Galas is not available. [name] does not know the name of the med for anxiety; said it should be on pts med list. ? Alprazolam.Please advise.

## 2016-10-01 NOTE — Therapy (Signed)
[place], [place], [address] Phone: [phone]   Fax:  [phone]  Physical Therapy Treatment  Patient Details  Name: Tiffany Arnold MRN: [id number] Date of Birth: 06-05-1941 Referring Provider: Dr. Dalbert Batman  Encounter Date: 10/01/2016      PT End of Session - 10/01/16 1707    Visit Number 10   Number of Visits 21   Date for PT Re-Evaluation 10/30/16   PT Start Time 1606   PT Stop Time 1656   PT Time Calculation (min) 50 min   Activity Tolerance Patient tolerated treatment well   Behavior During Therapy [facility] for tasks assessed/performed      Past Medical History:  Diagnosis Date  . Allergy   . Arthritis   . Asthma   . Cancer ([place]) 07/29/2016   right breast  . Cataract of both eyes   . Chicken pox   . Colon polyps   . Dog bite of index finger 07/23/2016   Left index finger  . GERD (gastroesophageal reflux disease)   . Hyperlipidemia   . Hypertension   . IBS (irritable bowel syndrome)   . Phlebitis   . Pneumonia    hx of several yrs ago  . Shortness of breath dyspnea    with exertion only  . Sleep apnea    wears CPAP machine nightly    Past Surgical History:  Procedure Laterality Date  . BACK SURGERY     lower  . BREAST CYST EXCISION Right 2001   negative  . BREAST LUMPECTOMY WITH NEEDLE LOCALIZATION AND AXILLARY LYMPH NODE DISSECTION Right 07/29/2016   Procedure: RIGHT BREAST LUMPECTOMY WITH DOUBLE NEEDLE LOCALIZATION AND COMPLETE RIGHT AXILLARY LYMPH NODE DISSECTION;  Surgeon: Fanny Skates, MD;  Location: [place];  Service: General;  Laterality: Right;  . BREAST SURGERY Left 2000   Biopsy  . BUNIONECTOMY Bilateral 1998   great toe fusion on right foot  . COLONOSCOPY W/ POLYPECTOMY    . HERNIA REPAIR  [id number]   [place] SINUS SURGERY  2015  . PORTACATH PLACEMENT N/A 09/05/2016   Procedure: INSERTION PORT-A-CATH;  Surgeon: Fanny Skates, MD;  [facility];   Service: General;  Laterality: N/A;  . REPLACEMENT TOTAL KNEE Bilateral 2007  . TOTAL SHOULDER REPLACEMENT Left 2007    There were no vitals filed for this visit.      Subjective Assessment - 10/01/16 1618    Subjective The bandages stayed on but slid down. I put paper tape on my fingers since they were coming off. But I tolerated them fine.    Pertinent History Right breast lumpectomy with 16 lymph nodes removed on July 29, 2016. Has had bilateral knee replacements within the past 5 years. Had left shoulder replaced    Patient Stated Goals to get rid of the stinging in her arm (pt states this goal has been met 09/25/2016)    Currently in Pain? No/denies               LYMPHEDEMA/ONCOLOGY QUESTIONNAIRE - 10/01/16 1619      Right Upper Extremity Lymphedema   15 cm Proximal to Olecranon Process 42.7 cm   10 cm Proximal to Olecranon Process 41.9 cm   Olecranon Process 31.4 cm   15 cm Proximal to Ulnar Styloid Process 29.9 cm   10 cm Proximal to Ulnar Styloid Process 28 cm   Just Proximal to Ulnar Styloid Process 19.9 cm   Across Hand at [employer]  Space 18.5 cm   At [place] of 2nd Digit 6 cm                  [facility] Adult PT Treatment/Exercise - 10/01/16 0001      Manual Therapy   Manual therapy comments Circumference measurements taken   Manual Lymphatic Drainage (MLD) In Supine: Short neck, superficial and deep abdominals, Rt inguinal nodes and Rt axillo-inguinal anastomosis, Lt axillary nodes and anterior inter-axillary anastomosis, and Rt UE from dorsal hand to lateral shoulder working proximal from distal then retracing all steps.   Compression Bandaging Lotion applied to Rt UE, then stockinette, Elastomull to all fingers, Artiflex x1 and 1-6 , 2-10, 1/4" gray foam added under 2nd 10 cm to posterior upper arm to keep bandages from rolling down. and 1-12 cm short stretch compression bandages from hand to axilla.                         [facility] Goals - 10/01/16 1712      CC Long Term Goal  #1   Title Patient with verbalize an understanding of lymphedema risk reduction precautions   Baseline attended ABC class on 09/22/2016   Status Achieved     CC Long Term Goal  #2   Title Patient will be independent in a home exercise program for range of motion and strength so that she can carry through at home    Status On-going     CC Long Term Goal  #3   Title Patient will report a decrease in pain by 50% so they can perform daily activities with greater ease   Status Achieved     CC Long Term Goal  #4   Title Patient will decrease the DASH score to <60   to demonstrate increased functional use of upper extremity   Status On-going     CC Long Term Goal  #5   Title Patient will improve right  shoulder abduction to 155 degrees so that she can achieve position needed for radiation therapy.   Status Achieved     CC Long Term Goal  #6   Title Patient will have a circumferential reduction of  2 cm at 10 cm above the right  ulnar styloid    Baseline 31 on 09/25/2016; 28 cm 10/01/16   Status Achieved            Plan - 10/01/16 1708    Clinical Impression Statement Pt came in with bandages on from Mondays session and though they had slid down some, her circumference measurements have reduced. Pt also had tolerated bandages well and had great reductions. Added gray foam to posterior upper arm so able to get bandages higher today than last session.    Rehab Potential Good   Clinical Impairments Affecting Rehab Potential previous total joint replacements in other joints with generalized weakness, obesity; caution of left total shoulder replacement  16 lymph nodes removed from right axilla    PT Frequency 3x / week   PT Duration 4 weeks   PT Treatment/Interventions ADLs/Self Care Home Management;Therapeutic activities;Therapeutic exercise;Taping;Manual lymph drainage;DME Instruction;Patient/family education;Passive range of  motion;Compression bandaging;Scar mobilization;Manual techniques   PT Next Visit Plan Cont CDT and assess how pt tolerated bandaging. Review supine scapular exercise and remedical range of motion and make sure pt is following up at home with exercise to address that goal    Consulted and Agree with Plan of Care Patient  Patient will benefit from skilled therapeutic intervention in order to improve the following deficits and impairments:  Obesity, Increased edema, Decreased knowledge of use of DME, Decreased knowledge of precautions, Decreased strength, Postural dysfunction, Impaired UE functional use  Visit Diagnosis: Lymphedema, not elsewhere classified  Localized swelling, mass and lump, trunk     Problem List Patient Active Problem List   Diagnosis Date Noted  . Breast cancer of lower-inner quadrant of right female breast ([place]) 06/27/2016  . OSA on CPAP 06/26/2016  . Type 2 diabetes mellitus without complication ([place]) 26/66/6486  . Severe obesity (BMI >= 40) ([place]) 09/04/2014  . Essential hypertension 09/04/2014  . HLD (hyperlipidemia) 09/04/2014  . Gastroesophageal reflux disease without esophagitis 09/04/2014  . Asthma, mild persistent 09/04/2014  . Urge incontinence 09/04/2014  . Seasonal allergies 09/04/2014  . Generalized anxiety disorder 09/04/2014    Otelia Limes, PTA 10/01/2016, 5:15 PM  [place], [place], [address] Phone: [phone]   Fax:  [phone]  Name: Tiffany Arnold MRN: [id number] Date of Birth: Apr 24, 1941  Serafina Royals, PT 10/01/16 5:26 PM

## 2016-10-01 NOTE — Telephone Encounter (Signed)
Would she be interested in starting Zoloft. This is a better idea than going up on Xanax

## 2016-10-02 ENCOUNTER — Ambulatory Visit (HOSPITAL_BASED_OUTPATIENT_CLINIC_OR_DEPARTMENT_OTHER): Payer: Medicare PPO

## 2016-10-02 ENCOUNTER — Telehealth: Payer: Self-pay

## 2016-10-02 ENCOUNTER — Encounter: Payer: Medicare PPO | Admitting: Physical Therapy

## 2016-10-02 VITALS — BP 141/61 | HR 63 | Temp 97.9°F | Resp 18

## 2016-10-02 DIAGNOSIS — C50311 Malignant neoplasm of lower-inner quadrant of right female breast: Secondary | ICD-10-CM | POA: Diagnosis not present

## 2016-10-02 DIAGNOSIS — Z5189 Encounter for other specified aftercare: Secondary | ICD-10-CM

## 2016-10-02 DIAGNOSIS — Z170 Estrogen receptor positive status [ER+]: Principal | ICD-10-CM

## 2016-10-02 DIAGNOSIS — G4733 Obstructive sleep apnea (adult) (pediatric): Secondary | ICD-10-CM

## 2016-10-02 MED ORDER — METFORMIN HCL 1000 MG PO TABS: ORAL_TABLET | ORAL | 2 refills | Status: DC

## 2016-10-02 MED ORDER — PEGFILGRASTIM INJECTION 6 MG/0.6ML ~~LOC~~
6.0000 mg | PREFILLED_SYRINGE | Freq: Once | SUBCUTANEOUS | Status: AC
  Administered 2016-10-02: 6 mg via SUBCUTANEOUS
  Filled 2016-10-02: qty 0.6

## 2016-10-02 NOTE — Addendum Note (Signed)
Addended by: Lurlean Nanny on: 10/02/2016 06:01 PM   Modules accepted: Orders

## 2016-10-02 NOTE — Telephone Encounter (Signed)
Tiffany Arnold, please inform pt, of severe OSA with AHI 54. Needs titration (either auto vs in-lab) as covered by insurance.   Previous Messages    ----- Message -----  From: Chesley Mires, MD  Sent: 10/01/2016  3:57 PM  To: Laverle Hobby, MD   Deep,   Home sleep study from 09/25/16 >> AHI 54.1, SaO2 low 68%.   Thanks.   [place]      Pt aware of sleep study results. Pt voiced understanding and had no further questions. Order placed. Nothing further needed.

## 2016-10-02 NOTE — Telephone Encounter (Signed)
She needs to be on Metformin any way. I tried to put her on medication for her diabetes back in April but she never would pick up the phone for Tiffany Arnold to let her know about her labs. Send in Metformin 1000 mg. Take 1/2 tab BID x 2 weeks then increase to 1 tab BID. #60, 2 refills. Repeat A1C in 3 months.

## 2016-10-02 NOTE — Telephone Encounter (Signed)
Pt is aware---Rx sent to the pharmacy-pt is aware she needs to have a 3 mth lab only appt and will schedule at a later date

## 2016-10-02 NOTE — Telephone Encounter (Signed)
Pt wants to start med to control BS today. Please advise. Pt is not on any meds for diabetes; BS has been borderline in past.

## 2016-10-02 NOTE — Patient Instructions (Signed)
Pegfilgrastim injection What is this medicine? PEGFILGRASTIM (PEG fil gra stim) is a long-acting granulocyte colony-stimulating factor that stimulates the growth of neutrophils, a type of white blood cell important in the body's fight against infection. It is used to reduce the incidence of fever and infection in patients with certain types of cancer who are receiving chemotherapy that affects the bone marrow, and to increase survival after being exposed to high doses of radiation. This medicine may be used for other purposes; ask your health care provider or pharmacist if you have questions. What should I tell my health care provider before I take this medicine? They need to know if you have any of these conditions: -kidney disease -latex allergy -ongoing radiation therapy -sickle cell disease -skin reactions to acrylic adhesives (On-Body Injector only) -an unusual or allergic reaction to pegfilgrastim, filgrastim, other medicines, foods, dyes, or preservatives -pregnant or trying to get pregnant -breast-feeding How should I use this medicine? This medicine is for injection under the skin. If you get this medicine at home, you will be taught how to prepare and give the pre-filled syringe or how to use the On-body Injector. Refer to the patient Instructions for Use for detailed instructions. Use exactly as directed. Take your medicine at regular intervals. Do not take your medicine more often than directed. It is important that you put your used needles and syringes in a special sharps container. Do not put them in a trash can. If you do not have a sharps container, call your pharmacist or healthcare provider to get one. Talk to your pediatrician regarding the use of this medicine in children. While this drug may be prescribed for selected conditions, precautions do apply. Overdosage: If you think you have taken too much of this medicine contact a poison control center or emergency room at  once. NOTE: This medicine is only for you. Do not share this medicine with others. What if I miss a dose? It is important not to miss your dose. Call your doctor or health care professional if you miss your dose. If you miss a dose due to an On-body Injector failure or leakage, a new dose should be administered as soon as possible using a single prefilled syringe for manual use. What may interact with this medicine? Interactions have not been studied. Give your health care provider a list of all the medicines, herbs, non-prescription drugs, or dietary supplements you use. Also tell them if you smoke, drink alcohol, or use illegal drugs. Some items may interact with your medicine. This list may not describe all possible interactions. Give your health care provider a list of all the medicines, herbs, non-prescription drugs, or dietary supplements you use. Also tell them if you smoke, drink alcohol, or use illegal drugs. Some items may interact with your medicine. What should I watch for while using this medicine? You may need blood work done while you are taking this medicine. If you are going to need a MRI, CT scan, or other procedure, tell your doctor that you are using this medicine (On-Body Injector only). What side effects may I notice from receiving this medicine? Side effects that you should report to your doctor or health care professional as soon as possible: -allergic reactions like skin rash, itching or hives, swelling of the face, lips, or tongue -dizziness -fever -pain, redness, or irritation at site where injected -pinpoint red spots on the skin -red or dark-brown urine -shortness of breath or breathing problems -stomach or side pain, or pain   at the shoulder -swelling -tiredness -trouble passing urine or change in the amount of urine Side effects that usually do not require medical attention (report to your doctor or health care professional if they continue or are  bothersome): -bone pain -muscle pain This list may not describe all possible side effects. Call your doctor for medical advice about side effects. You may report side effects to FDA at 1-800-FDA-1088. Where should I keep my medicine? Keep out of the reach of children. Store pre-filled syringes in a refrigerator between 2 and 8 degrees C (36 and 46 degrees F). Do not freeze. Keep in carton to protect from light. Throw away this medicine if it is left out of the refrigerator for more than 48 hours. Throw away any unused medicine after the expiration date. NOTE: This sheet is a summary. It may not cover all possible information. If you have questions about this medicine, talk to your doctor, pharmacist, or health care provider.    2016, [employer]vier/Gold Standard. (2014-12-28 14:30:14)  

## 2016-10-03 ENCOUNTER — Ambulatory Visit: Payer: Medicare PPO | Admitting: Physical Therapy

## 2016-10-03 ENCOUNTER — Encounter: Payer: Self-pay | Admitting: Physical Therapy

## 2016-10-03 DIAGNOSIS — M6281 Muscle weakness (generalized): Secondary | ICD-10-CM | POA: Diagnosis not present

## 2016-10-03 DIAGNOSIS — R2231 Localized swelling, mass and lump, right upper limb: Secondary | ICD-10-CM | POA: Diagnosis not present

## 2016-10-03 DIAGNOSIS — G4733 Obstructive sleep apnea (adult) (pediatric): Secondary | ICD-10-CM | POA: Diagnosis not present

## 2016-10-03 DIAGNOSIS — I890 Lymphedema, not elsewhere classified: Secondary | ICD-10-CM

## 2016-10-03 DIAGNOSIS — R222 Localized swelling, mass and lump, trunk: Secondary | ICD-10-CM | POA: Diagnosis not present

## 2016-10-03 DIAGNOSIS — M25611 Stiffness of right shoulder, not elsewhere classified: Secondary | ICD-10-CM | POA: Diagnosis not present

## 2016-10-03 NOTE — Therapy (Signed)
[place], [place], [address] Phone: [phone]   Fax:  [phone]  Physical Therapy Treatment  Patient Details  Name: Tiffany Arnold MRN: [id number] Date of Birth: 1941-12-13 Referring Provider: Dr. Dalbert Batman  Encounter Date: 10/03/2016      PT End of Session - 10/03/16 1043    Visit Number 11   Number of Visits 21   Date for PT Re-Evaluation 10/30/16   PT Start Time 8841   PT Stop Time 6606  did not bill for all time because therapist stepped out of room for a phone call with a NP   PT Time Calculation (min) 64 min   Activity Tolerance Patient tolerated treatment well   Behavior During Therapy [facility] for tasks assessed/performed      Past Medical History:  Diagnosis Date  . Allergy   . Arthritis   . Asthma   . Cancer ([place]) 07/29/2016   right breast  . Cataract of both eyes   . Chicken pox   . Colon polyps   . Dog bite of index finger 07/23/2016   Left index finger  . GERD (gastroesophageal reflux disease)   . Hyperlipidemia   . Hypertension   . IBS (irritable bowel syndrome)   . Phlebitis   . Pneumonia    hx of several yrs ago  . Shortness of breath dyspnea    with exertion only  . Sleep apnea    wears CPAP machine nightly    Past Surgical History:  Procedure Laterality Date  . BACK SURGERY     lower  . BREAST CYST EXCISION Right 2001   negative  . BREAST LUMPECTOMY WITH NEEDLE LOCALIZATION AND AXILLARY LYMPH NODE DISSECTION Right 07/29/2016   Procedure: RIGHT BREAST LUMPECTOMY WITH DOUBLE NEEDLE LOCALIZATION AND COMPLETE RIGHT AXILLARY LYMPH NODE DISSECTION;  Surgeon: Fanny Skates, MD;  Location: [place];  Service: General;  Laterality: Right;  . BREAST SURGERY Left 2000   Biopsy  . BUNIONECTOMY Bilateral 1998   great toe fusion on right foot  . COLONOSCOPY W/ POLYPECTOMY    . HERNIA REPAIR  3016   [place] SINUS SURGERY  2015  . PORTACATH PLACEMENT N/A  09/05/2016   Procedure: INSERTION PORT-A-CATH;  Surgeon: Fanny Skates, MD;  [facility];  Service: General;  Laterality: N/A;  . REPLACEMENT TOTAL KNEE Bilateral 2007  . TOTAL SHOULDER REPLACEMENT Left 2007    There were no vitals filed for this visit.      Subjective Assessment - 10/03/16 0938    Subjective These bandages were really snug. I have trouble doing things because they limit my mobility. I wish I could have a weekend off.    Pertinent History Right breast lumpectomy with 16 lymph nodes removed on July 29, 2016. Has had bilateral knee replacements within the past 5 years. Had left shoulder replaced    Patient Stated Goals to get rid of the stinging in her arm (pt states this goal has been met 09/25/2016)    Currently in Pain? No/denies   Pain Score 0-No pain                         [facility] Adult PT Treatment/Exercise - 10/03/16 0001      Shoulder Exercises: Supine   Horizontal ABduction Strengthening;Both;10 reps;Theraband   Theraband Level (Shoulder Horizontal ABduction) Level 1 (Yellow)   External Rotation Strengthening;10 reps;Both   Theraband Level (Shoulder External Rotation)  Level 1 (Yellow)   Flexion Strengthening;Both;10 reps   Theraband Level (Shoulder Flexion) Level 1 (Yellow)   Other Supine Exercises D2 bilaterally with yellow band x 10 reps     Manual Therapy   Manual Lymphatic Drainage (MLD) In Supine: Short neck, superficial and deep abdominals, Rt inguinal nodes and Rt axillo-inguinal anastomosis, Lt axillary nodes and anterior inter-axillary anastomosis, and Rt UE from dorsal hand to lateral shoulder working proximal from distal then retracing all steps.   Compression Bandaging Lotion applied to Rt UE, then stockinette, Elastomull to all fingers, Artiflex x1 and 1-6 , 2-10, 1/4" gray foam added under 2nd 10 cm to posterior upper arm to keep bandages from rolling down. and 1-12 cm short stretch compression bandages from hand to axilla.                          [facility] Goals - 10/01/16 1712      CC Long Term Goal  #1   Title Patient with verbalize an understanding of lymphedema risk reduction precautions   Baseline attended ABC class on 09/22/2016   Status Achieved     CC Long Term Goal  #2   Title Patient will be independent in a home exercise program for range of motion and strength so that she can carry through at home    Status On-going     CC Long Term Goal  #3   Title Patient will report a decrease in pain by 50% so they can perform daily activities with greater ease   Status Achieved     CC Long Term Goal  #4   Title Patient will decrease the DASH score to <60   to demonstrate increased functional use of upper extremity   Status On-going     CC Long Term Goal  #5   Title Patient will improve right  shoulder abduction to 155 degrees so that she can achieve position needed for radiation therapy.   Status Achieved     CC Long Term Goal  #6   Title Patient will have a circumferential reduction of  2 cm at 10 cm above the right  ulnar styloid    Baseline 31 on 09/25/2016; 28 cm 10/01/16   Status Achieved            Plan - 10/03/16 1044    Clinical Impression Statement The foam last session helped keep bandages in place. Patient arrived to session with all bandages intact. Foam was readded this session. MLD performed to right upper extremity followed by compression bandaging. Reivewed supine scapular stabilization exercises with patient today and patient required max verbal and tactile cues. Gave patient a red theraband to complete exercises at home and encouraged patient to do exercises at home.    Rehab Potential Good   Clinical Impairments Affecting Rehab Potential previous total joint replacements in other joints with generalized weakness, obesity; caution of left total shoulder replacement  16 lymph nodes removed from right axilla    PT Frequency 3x / week   PT Duration 4 weeks    PT Treatment/Interventions ADLs/Self Care Home Management;Therapeutic activities;Therapeutic exercise;Taping;Manual lymph drainage;DME Instruction;Patient/family education;Passive range of motion;Compression bandaging;Scar mobilization;Manual techniques   PT Next Visit Plan Cont CDT, Review supine scapular exercise and remedical range of motion   Consulted and Agree with Plan of Care Patient      Patient will benefit from skilled therapeutic intervention in order to improve the following deficits and  impairments:  Obesity, Increased edema, Decreased knowledge of use of DME, Decreased knowledge of precautions, Decreased strength, Postural dysfunction, Impaired UE functional use  Visit Diagnosis: Lymphedema, not elsewhere classified     Problem List Patient Active Problem List   Diagnosis Date Noted  . Breast cancer of lower-inner quadrant of right female breast ([place]) 06/27/2016  . OSA on CPAP 06/26/2016  . Type 2 diabetes mellitus without complication ([place]) 59/13/6859  . Severe obesity (BMI >= 40) ([place]) 09/04/2014  . Essential hypertension 09/04/2014  . HLD (hyperlipidemia) 09/04/2014  . Gastroesophageal reflux disease without esophagitis 09/04/2014  . Asthma, mild persistent 09/04/2014  . Urge incontinence 09/04/2014  . Seasonal allergies 09/04/2014  . Generalized anxiety disorder 09/04/2014    Alexia Freestone 10/03/2016, 10:47 AM  [place], [place], [address] Phone: [phone]   Fax:  [phone]  Name: Tiffany Arnold MRN: [id number] Date of Birth: [birth year]  Allyson Sabal, PT 10/03/16 10:47 AM

## 2016-10-05 ENCOUNTER — Other Ambulatory Visit: Payer: Self-pay | Admitting: Internal Medicine

## 2016-10-06 ENCOUNTER — Ambulatory Visit: Payer: Medicare PPO

## 2016-10-06 ENCOUNTER — Other Ambulatory Visit: Payer: Self-pay | Admitting: *Deleted

## 2016-10-06 DIAGNOSIS — M6281 Muscle weakness (generalized): Secondary | ICD-10-CM | POA: Diagnosis not present

## 2016-10-06 DIAGNOSIS — M25611 Stiffness of right shoulder, not elsewhere classified: Secondary | ICD-10-CM | POA: Diagnosis not present

## 2016-10-06 DIAGNOSIS — I890 Lymphedema, not elsewhere classified: Secondary | ICD-10-CM | POA: Diagnosis not present

## 2016-10-06 DIAGNOSIS — R2231 Localized swelling, mass and lump, right upper limb: Secondary | ICD-10-CM | POA: Diagnosis not present

## 2016-10-06 DIAGNOSIS — R222 Localized swelling, mass and lump, trunk: Secondary | ICD-10-CM

## 2016-10-06 DIAGNOSIS — Z9989 Dependence on other enabling machines and devices: Principal | ICD-10-CM

## 2016-10-06 DIAGNOSIS — G4733 Obstructive sleep apnea (adult) (pediatric): Secondary | ICD-10-CM

## 2016-10-06 NOTE — Therapy (Signed)
[place], [place], [address] Phone: [phone]   Fax:  [phone]  Physical Therapy Treatment  Patient Details  Name: Tiffany Arnold MRN: [id number] Date of Birth: Mar 05, 1941 Referring Provider: Dr. Dalbert Batman  Encounter Date: 10/06/2016      PT End of Session - 10/06/16 1112    Visit Number 12   Number of Visits 21   Date for PT Re-Evaluation 10/30/16   PT Start Time 1021   PT Stop Time 1105   PT Time Calculation (min) 44 min   Activity Tolerance Patient tolerated treatment well   Behavior During Therapy [facility] for tasks assessed/performed      Past Medical History:  Diagnosis Date  . Allergy   . Arthritis   . Asthma   . Cancer ([place]) 07/29/2016   right breast  . Cataract of both eyes   . Chicken pox   . Colon polyps   . Dog bite of index finger 07/23/2016   Left index finger  . GERD (gastroesophageal reflux disease)   . Hyperlipidemia   . Hypertension   . IBS (irritable bowel syndrome)   . Phlebitis   . Pneumonia    hx of several yrs ago  . Shortness of breath dyspnea    with exertion only  . Sleep apnea    wears CPAP machine nightly    Past Surgical History:  Procedure Laterality Date  . BACK SURGERY     lower  . BREAST CYST EXCISION Right 2001   negative  . BREAST LUMPECTOMY WITH NEEDLE LOCALIZATION AND AXILLARY LYMPH NODE DISSECTION Right 07/29/2016   Procedure: RIGHT BREAST LUMPECTOMY WITH DOUBLE NEEDLE LOCALIZATION AND COMPLETE RIGHT AXILLARY LYMPH NODE DISSECTION;  Surgeon: Fanny Skates, MD;  Location: [place];  Service: General;  Laterality: Right;  . BREAST SURGERY Left 2000   Biopsy  . BUNIONECTOMY Bilateral 1998   great toe fusion on right foot  . COLONOSCOPY W/ POLYPECTOMY    . HERNIA REPAIR  0086   [place] SINUS SURGERY  2015  . PORTACATH PLACEMENT N/A 09/05/2016   Procedure: INSERTION PORT-A-CATH;  Surgeon: Fanny Skates, MD;  [facility];   Service: General;  Laterality: N/A;  . REPLACEMENT TOTAL KNEE Bilateral 2007  . TOTAL SHOULDER REPLACEMENT Left 2007    There were no vitals filed for this visit.      Subjective Assessment - 10/06/16 1032    Subjective I left the bandage on all weekend but I really struggle with it, I even have trouble wiping myself. How much longer do we have to do this for? (see clinical assessment section) I'm not feeling great today from my meds. Just a little off and loose stool, just don't feel great.    Pertinent History Right breast lumpectomy with 16 lymph nodes removed on July 29, 2016. Has had bilateral knee replacements within the past 5 years. Had left shoulder replaced    Patient Stated Goals to get rid of the stinging in her arm (pt states this goal has been met 09/25/2016)    Currently in Pain? No/denies                         [facility] Adult PT Treatment/Exercise - 10/06/16 0001      Manual Therapy   Manual Lymphatic Drainage (MLD) In Supine: Short neck, superficial and 5 diaphragmatic breaths, Rt inguinal nodes and Rt axillo-inguinal anastomosis, Lt axillary nodes and anterior  inter-axillary anastomosis, and Rt UE from dorsal hand to lateral shoulder working proximal from distal then retracing all steps.   Compression Bandaging Lotion applied to Rt UE, then stockinette, Elastomull to fingers 2-4, Artiflex x1 with 1/4" gray foam at anterior elbow and peach Medi foam lined foam inferior to gray foam and 1-6 , 2-10, 1/4" gray foam added under 2nd 10 cm to posterior upper arm to keep bandages from rolling down. and 1-12 cm short stretch compression bandages from hand to axilla.                         [facility] Goals - 10/01/16 1712      CC Long Term Goal  #1   Title Patient with verbalize an understanding of lymphedema risk reduction precautions   Baseline attended ABC class on 09/22/2016   Status Achieved     CC Long Term Goal  #2   Title Patient  will be independent in a home exercise program for range of motion and strength so that she can carry through at home    Status On-going     CC Long Term Goal  #3   Title Patient will report a decrease in pain by 50% so they can perform daily activities with greater ease   Status Achieved     CC Long Term Goal  #4   Title Patient will decrease the DASH score to <60   to demonstrate increased functional use of upper extremity   Status On-going     CC Long Term Goal  #5   Title Patient will improve right  shoulder abduction to 155 degrees so that she can achieve position needed for radiation therapy.   Status Achieved     CC Long Term Goal  #6   Title Patient will have a circumferential reduction of  2 cm at 10 cm above the right  ulnar styloid    Baseline 31 on 09/25/2016; 28 cm 10/01/16   Status Achieved            Plan - 10/06/16 1113    Clinical Impression Statement Pt continues to be compliant with bandages though reports is struggling with being compliant. She asked again how much longer she has to be bandaged so discussed with her that we can see how her measurements look at next visit when we assess circumference and if she decided she is pleased with her reduction then we may just go ahead and have her measured sooner than planned. She verbalized understanding this and being pleased with this. She also hasn't been feeling well from current chemo treatment and reports this is just one more thing for her to deal with. Her skin integrity overall looked good except for some mild redness at her anterior elbow so added 1/4" gray foam here today and small piece of peach Medi foam directly inferior to gray foam at anterior upper forearm.  Continued with gray foam at posterior upper arm as this helped with being able to bandage pts arm to near axilla without it rolling due to increased arm circumference here.   Rehab Potential Good   Clinical Impairments Affecting Rehab Potential previous  total joint replacements in other joints with generalized weakness, obesity; caution of left total shoulder replacement  16 lymph nodes removed from right axilla    PT Frequency 3x / week   PT Duration 4 weeks   PT Treatment/Interventions ADLs/Self Care Home Management;Therapeutic activities;Therapeutic exercise;Taping;Manual lymph  drainage;DME Instruction;Patient/family education;Passive range of motion;Compression bandaging;Scar mobilization;Manual techniques   PT Next Visit Plan Remeasure circumference and consider having pt measured for compression garment(s). Cont CDT, Review supine scapular exercise and remedical range of motion   Consulted and Agree with Plan of Care Patient      Patient will benefit from skilled therapeutic intervention in order to improve the following deficits and impairments:  Obesity, Increased edema, Decreased knowledge of use of DME, Decreased knowledge of precautions, Decreased strength, Postural dysfunction, Impaired UE functional use  Visit Diagnosis: Localized swelling, mass and lump, trunk  Lymphedema, not elsewhere classified     Problem List Patient Active Problem List   Diagnosis Date Noted  . Breast cancer of lower-inner quadrant of right female breast ([place]) 06/27/2016  . OSA on CPAP 06/26/2016  . Type 2 diabetes mellitus without complication ([place]) 10/18/9021  . Severe obesity (BMI >= 40) ([place]) 09/04/2014  . Essential hypertension 09/04/2014  . HLD (hyperlipidemia) 09/04/2014  . Gastroesophageal reflux disease without esophagitis 09/04/2014  . Asthma, mild persistent 09/04/2014  . Urge incontinence 09/04/2014  . Seasonal allergies 09/04/2014  . Generalized anxiety disorder 09/04/2014    Otelia Limes, PTA 10/06/2016, 11:26 AM  [place], [place], [address] Phone: [phone]   Fax:  [phone]  Name: Tiffany Arnold MRN: [id number] Date of  Birth: June 14, 1941

## 2016-10-08 ENCOUNTER — Ambulatory Visit: Payer: Medicare PPO | Admitting: Physical Therapy

## 2016-10-08 ENCOUNTER — Other Ambulatory Visit: Payer: Self-pay | Admitting: General Surgery

## 2016-10-08 DIAGNOSIS — M25611 Stiffness of right shoulder, not elsewhere classified: Secondary | ICD-10-CM | POA: Diagnosis not present

## 2016-10-08 DIAGNOSIS — M6281 Muscle weakness (generalized): Secondary | ICD-10-CM | POA: Diagnosis not present

## 2016-10-08 DIAGNOSIS — R222 Localized swelling, mass and lump, trunk: Secondary | ICD-10-CM

## 2016-10-08 DIAGNOSIS — R2231 Localized swelling, mass and lump, right upper limb: Secondary | ICD-10-CM | POA: Diagnosis not present

## 2016-10-08 DIAGNOSIS — I890 Lymphedema, not elsewhere classified: Secondary | ICD-10-CM

## 2016-10-08 DIAGNOSIS — I82629 Acute embolism and thrombosis of deep veins of unspecified upper extremity: Secondary | ICD-10-CM

## 2016-10-08 NOTE — Therapy (Signed)
[place], [place], [address] Phone: [phone]   Fax:  [phone]  Physical Therapy Treatment  Patient Details  Name: Tiffany Arnold MRN: [id number] Date of Birth: Feb 26, 1941 Referring Provider: Dr. Dalbert Batman  Encounter Date: 10/08/2016      PT End of Session - 10/08/16 1510    Visit Number 13   Number of Visits 21   Date for PT Re-Evaluation 10/30/16   PT Start Time 1301   PT Stop Time 1352   PT Time Calculation (min) 51 min   Activity Tolerance Patient tolerated treatment well   Behavior During Therapy [facility] for tasks assessed/performed      Past Medical History:  Diagnosis Date  . Allergy   . Arthritis   . Asthma   . Cancer ([place]) 07/29/2016   right breast  . Cataract of both eyes   . Chicken pox   . Colon polyps   . Dog bite of index finger 07/23/2016   Left index finger  . GERD (gastroesophageal reflux disease)   . Hyperlipidemia   . Hypertension   . IBS (irritable bowel syndrome)   . Phlebitis   . Pneumonia    hx of several yrs ago  . Shortness of breath dyspnea    with exertion only  . Sleep apnea    wears CPAP machine nightly    Past Surgical History:  Procedure Laterality Date  . BACK SURGERY     lower  . BREAST CYST EXCISION Right 2001   negative  . BREAST LUMPECTOMY WITH NEEDLE LOCALIZATION AND AXILLARY LYMPH NODE DISSECTION Right 07/29/2016   Procedure: RIGHT BREAST LUMPECTOMY WITH DOUBLE NEEDLE LOCALIZATION AND COMPLETE RIGHT AXILLARY LYMPH NODE DISSECTION;  Surgeon: Fanny Skates, MD;  Location: [place];  Service: General;  Laterality: Right;  . BREAST SURGERY Left 2000   Biopsy  . BUNIONECTOMY Bilateral 1998   great toe fusion on right foot  . COLONOSCOPY W/ POLYPECTOMY    . HERNIA REPAIR  [id number]   [place] SINUS SURGERY  2015  . PORTACATH PLACEMENT N/A 09/05/2016   Procedure: INSERTION PORT-A-CATH;  Surgeon: Fanny Skates, MD;  Location: [facility];   Service: General;  Laterality: N/A;  . REPLACEMENT TOTAL KNEE Bilateral 2007  . TOTAL SHOULDER REPLACEMENT Left 2007    There were no vitals filed for this visit.      Subjective Assessment - 10/08/16 1304    Subjective Reports her stools are still a little loose, but otherwise feels okay.   Pertinent History Right breast lumpectomy with 16 lymph nodes removed on July 29, 2016. Has had bilateral knee replacements within the past 5 years. Had left shoulder replaced    Patient Stated Goals to get rid of the stinging in her arm (pt states this goal has been met 09/25/2016)    Currently in Pain? No/denies               LYMPHEDEMA/ONCOLOGY QUESTIONNAIRE - 10/08/16 1308      Right Upper Extremity Lymphedema   15 cm Proximal to Olecranon Process 43.4 cm   10 cm Proximal to Olecranon Process 43 cm   Olecranon Process 31.6 cm   15 cm Proximal to Ulnar Styloid Process 30.2 cm   10 cm Proximal to Ulnar Styloid Process 27.3 cm   Just Proximal to Ulnar Styloid Process 20 cm   Across Hand at [employer] 18.5 cm   At [place] of 2nd Digit 6.2 cm  [facility] Adult PT Treatment/Exercise - 10/08/16 0001      Manual Therapy   Manual therapy comments Circumference measurements taken   Manual Lymphatic Drainage (MLD) In Supine: Short neck, superficial and 5 diaphragmatic breaths, Rt inguinal nodes and Rt axillo-inguinal anastomosis, Lt axillary nodes and anterior inter-axillary anastomosis, and Rt UE from dorsal hand to lateral shoulder working proximal from distal then retracing all steps.   Compression Bandaging Lotion applied to Rt UE, then stockinette, Elastomull to fingers 2-4, Artiflex x1 with 1/4" gray foam at anterior elbow and 1-6 , 2-10, 1/4" gray foam added under 2nd 10 cm to posterior upper arm to keep bandages from rolling down. and 1-12 cm short stretch compression bandages from hand to axilla.                         [facility] Goals  - 10/08/16 [place] Term Goal  #1   Title Patient with verbalize an understanding of lymphedema risk reduction precautions   Baseline attended ABC class on 09/22/2016   Time 4   Period Weeks   Status Achieved     CC Long Term Goal  #2   Title Patient will be independent in a home exercise program for range of motion and strength so that she can carry through at home    Time 4   Period Weeks   Status On-going     CC Long Term Goal  #3   Title Patient will report a decrease in pain by 50% so they can perform daily activities with greater ease   Baseline no change yet 09/04/16; 60% improvement on 09/12/16, Pt is a "whole lot better "  Pt says she is not having pain  09/25/2016   Time 4   Period Weeks   Status Achieved     CC Long Term Goal  #4   Title Patient will decrease the DASH score to <60   to demonstrate increased functional use of upper extremity   Baseline 84.09 on eval, 22.73 on 09/22/2016   Time 4   Period Weeks   Status On-going     CC Long Term Goal  #5   Title Patient will improve right  shoulder abduction to 155 degrees so that she can achieve position needed for radiation therapy.   Baseline 114 degrees 09/04/16; 143 degrees on 09/12/16  155 on 09/16/2016   Time 4   Period Weeks   Status Achieved     CC Long Term Goal  #6   Title Patient will have a circumferential reduction of  2 cm at 10 cm above the right  ulnar styloid    Baseline 31 on 09/25/2016; 28 cm 10/01/16; 27.3 on 10/08/16   Time 4   Period Weeks   Status Achieved            Plan - 10/08/16 1511    Clinical Impression Statement Patient demonstrates slightly increased circumference measurements in some places today but measurements are still decreased from onset of lymphedema. She expressed interest in ordering velcro garment and therapist will discuss this with her further at a later session. Therapist continued with manual lymph drainage and compression bandaging today.   Rehab Potential  Good   Clinical Impairments Affecting Rehab Potential previous total joint replacements in other joints with generalized weakness, obesity; caution of left total shoulder replacement  16 lymph nodes removed from right axilla    PT  Frequency 3x / week   PT Duration 4 weeks   PT Treatment/Interventions ADLs/Self Care Home Management;Therapeutic activities;Therapeutic exercise;Taping;Manual lymph drainage;DME Instruction;Patient/family education;Passive range of motion;Compression bandaging;Scar mobilization;Manual techniques   PT Next Visit Plan continue manual lymph drainage and compression bandaging to right UE   Consulted and Agree with Plan of Care Patient      Patient will benefit from skilled therapeutic intervention in order to improve the following deficits and impairments:  Obesity, Increased edema, Decreased knowledge of use of DME, Decreased knowledge of precautions, Decreased strength, Postural dysfunction, Impaired UE functional use  Visit Diagnosis: Localized swelling, mass and lump, trunk  Lymphedema, not elsewhere classified     Problem List Patient Active Problem List   Diagnosis Date Noted  . Breast cancer of lower-inner quadrant of right female breast ([place]) 06/27/2016  . OSA on CPAP 06/26/2016  . Type 2 diabetes mellitus without complication ([place]) 25/74/9355  . Severe obesity (BMI >= 40) ([place]) 09/04/2014  . Essential hypertension 09/04/2014  . HLD (hyperlipidemia) 09/04/2014  . Gastroesophageal reflux disease without esophagitis 09/04/2014  . Asthma, mild persistent 09/04/2014  . Urge incontinence 09/04/2014  . Seasonal allergies 09/04/2014  . Generalized anxiety disorder 09/04/2014    Mellody Life 10/08/2016, 3:16 PM  [place], [place], [address] Phone: [phone]   Fax:  [phone]  Name: Tiffany Arnold MRN: [id number] Date of Birth: May 10, 1941  Saverio Danker, SPT  This entire session was guided, instructed, and directly supervised by Serafina Royals, PT.  Read, reviewed, edited and agree with student's findings and recommendations.   Serafina Royals, PT 10/08/16 3:51 PM

## 2016-10-09 ENCOUNTER — Ambulatory Visit: Payer: Medicare PPO

## 2016-10-09 ENCOUNTER — Ambulatory Visit
Admission: RE | Admit: 2016-10-09 | Discharge: 2016-10-09 | Disposition: A | Discharge status: Home / Self Care | Payer: Medicare PPO | Source: Ambulatory Visit | Attending: General Surgery | Admitting: General Surgery

## 2016-10-09 DIAGNOSIS — R2231 Localized swelling, mass and lump, right upper limb: Secondary | ICD-10-CM | POA: Diagnosis not present

## 2016-10-09 DIAGNOSIS — M79604 Pain in right leg: Secondary | ICD-10-CM | POA: Diagnosis not present

## 2016-10-09 DIAGNOSIS — I890 Lymphedema, not elsewhere classified: Secondary | ICD-10-CM

## 2016-10-09 DIAGNOSIS — M6281 Muscle weakness (generalized): Secondary | ICD-10-CM | POA: Diagnosis not present

## 2016-10-09 DIAGNOSIS — M79621 Pain in right upper arm: Secondary | ICD-10-CM | POA: Diagnosis not present

## 2016-10-09 DIAGNOSIS — R222 Localized swelling, mass and lump, trunk: Secondary | ICD-10-CM | POA: Diagnosis not present

## 2016-10-09 DIAGNOSIS — M25611 Stiffness of right shoulder, not elsewhere classified: Secondary | ICD-10-CM | POA: Diagnosis not present

## 2016-10-09 DIAGNOSIS — R600 Localized edema: Secondary | ICD-10-CM | POA: Diagnosis not present

## 2016-10-09 DIAGNOSIS — I82629 Acute embolism and thrombosis of deep veins of unspecified upper extremity: Secondary | ICD-10-CM

## 2016-10-09 NOTE — Therapy (Signed)
[facility] [address] [place], [place], [address] Phone: [phone]   Fax:  [phone]  Physical Therapy Treatment  Patient Details  Name: Tiffany Arnold MRN: [id number] Date of Birth: 09/11/1941 Referring Provider: Dr. Ingram  Encounter Date: 10/09/2016      PT End of Session - 10/09/16 1219    Visit Number 14   Number of Visits 21   Date for PT Re-Evaluation 10/30/16   PT Start Time 1148   PT Stop Time 1208   PT Time Calculation (min) 20 min   Activity Tolerance Patient tolerated treatment well   Behavior During Therapy [facility] for tasks assessed/performed      Past Medical History:  Diagnosis Date  . Allergy   . Arthritis   . Asthma   . Cancer ([facility]) 07/29/2016   right breast  . Cataract of both eyes   . Chicken pox   . Colon polyps   . Dog bite of index finger 07/23/2016   Left index finger  . GERD (gastroesophageal reflux disease)   . Hyperlipidemia   . Hypertension   . IBS (irritable bowel syndrome)   . Phlebitis   . Pneumonia    hx of several yrs ago  . Shortness of breath dyspnea    with exertion only  . Sleep apnea    wears CPAP machine nightly    Past Surgical History:  Procedure Laterality Date  . BACK SURGERY     lower  . BREAST CYST EXCISION Right 2001   negative  . BREAST LUMPECTOMY WITH NEEDLE LOCALIZATION AND AXILLARY LYMPH NODE DISSECTION Right 07/29/2016   Procedure: RIGHT BREAST LUMPECTOMY WITH DOUBLE NEEDLE LOCALIZATION AND COMPLETE RIGHT AXILLARY LYMPH NODE DISSECTION;  Surgeon: [place] Ingram, MD;  [facility];  Service: General;  Laterality: Right;  . BREAST SURGERY Left 2000   Biopsy  . BUNIONECTOMY Bilateral 1998   great toe fusion on right foot  . COLONOSCOPY W/ POLYPECTOMY    . HERNIA REPAIR  2015   Umbillical  . [place] SINUS SURGERY  2015  . PORTACATH PLACEMENT N/A 09/05/2016   Procedure: INSERTION PORT-A-CATH;  Surgeon: [place] Ingram, MD;  [facility];   Service: General;  Laterality: N/A;  . REPLACEMENT TOTAL KNEE Bilateral 2007  . TOTAL SHOULDER REPLACEMENT Left 2007    There were no vitals filed for this visit.      Subjective Assessment - 10/09/16 1150    Subjective Had to get a doppler yesterday so all the bandages had to come off so I'm glad you guys could get me rewrapped today. The results were negative from the doppler.   Pertinent History Right breast lumpectomy with 16 lymph nodes removed on July 29, 2016. Has had bilateral knee replacements within the past 5 years. Had left shoulder replaced    Patient Stated Goals to get rid of the stinging in her arm (pt states this goal has been met 09/25/2016)    Currently in Pain? No/denies               LYMPHEDEMA/ONCOLOGY QUESTIONNAIRE - 10/08/16 1308      Right Upper Extremity Lymphedema   15 cm Proximal to Olecranon Process 43.4 cm   10 cm Proximal to Olecranon Process 43 cm   Olecranon Process 31.6 cm   15 cm Proximal to Ulnar Styloid Process 30.2 cm   10 cm Proximal to Ulnar Styloid Process 27.3 cm   Just Proximal to Ulnar Styloid Process 20 cm     Across Hand at [employer] 18.5 cm   At [place] of 2nd Digit 6.2 cm                  [facility] Adult PT Treatment/Exercise - 10/09/16 0001      Manual Therapy   Compression Bandaging Lotion applied to Rt UE, then stockinette, Elastomull to fingers 2-4, Artiflex x1 with 1/4" gray foam at anterior elbow and 1-6 , 2-10, 1/4" gray foam added under 2nd 10 cm to posterior upper arm to keep bandages from rolling down and used herring bone technique with this bandage from elbow to axilla. and 1-12 cm short stretch compression bandages from hand to axilla.                         [facility] Goals - 10/08/16 [place] Term Goal  #1   Title Patient with verbalize an understanding of lymphedema risk reduction precautions   Baseline attended ABC class on 09/22/2016   Time 4   Period Weeks    Status Achieved     CC Long Term Goal  #2   Title Patient will be independent in a home exercise program for range of motion and strength so that she can carry through at home    Time 4   Period Weeks   Status On-going     CC Long Term Goal  #3   Title Patient will report a decrease in pain by 50% so they can perform daily activities with greater ease   Baseline no change yet 09/04/16; 60% improvement on 09/12/16, Pt is a "whole lot better "  Pt says she is not having pain  09/25/2016   Time 4   Period Weeks   Status Achieved     CC Long Term Goal  #4   Title Patient will decrease the DASH score to <60   to demonstrate increased functional use of upper extremity   Baseline 84.09 on eval, 22.73 on 09/22/2016   Time 4   Period Weeks   Status On-going     CC Long Term Goal  #5   Title Patient will improve right  shoulder abduction to 155 degrees so that she can achieve position needed for radiation therapy.   Baseline 114 degrees 09/04/16; 143 degrees on 09/12/16  155 on 09/16/2016   Time 4   Period Weeks   Status Achieved     CC Long Term Goal  #6   Title Patient will have a circumferential reduction of  2 cm at 10 cm above the right  ulnar styloid    Baseline 31 on 09/25/2016; 28 cm 10/01/16; 27.3 on 10/08/16   Time 4   Period Weeks   Status Achieved            Plan - 10/09/16 1220    Clinical Impression Statement Dr. Leonel Ramsay wanted pt to have a Doppler to rule out DVT in her Rt UE so bandages were removed for this. She reports she was told results from this were negative and so she requested to come back today instead of tomorrow as she didn't want to be without bandages any longer than she had too. So only time to bandage pt today and she will try to keep these on until Saturday and then remove them if they are sliding down and bring all bandages back.    Rehab Potential Good   Clinical Impairments  Affecting Rehab Potential previous total joint replacements in other joints with  generalized weakness, obesity; caution of left total shoulder replacement  16 lymph nodes removed from right axilla    PT Frequency 3x / week   PT Duration 4 weeks   PT Treatment/Interventions ADLs/Self Care Home Management;Therapeutic activities;Therapeutic exercise;Taping;Manual lymph drainage;DME Instruction;Patient/family education;Passive range of motion;Compression bandaging;Scar mobilization;Manual techniques   PT Next Visit Plan continue manual lymph drainage and compression bandaging to right UE; possibly assist pt with ordering a velcro garment in next week or 2, have considered [employer].   Consulted and Agree with Plan of Care Patient      Patient will benefit from skilled therapeutic intervention in order to improve the following deficits and impairments:  Obesity, Increased edema, Decreased knowledge of use of DME, Decreased knowledge of precautions, Decreased strength, Postural dysfunction, Impaired UE functional use  Visit Diagnosis: Localized swelling, mass and lump, trunk  Lymphedema, not elsewhere classified     Problem List Patient Active Problem List   Diagnosis Date Noted  . Breast cancer of lower-inner quadrant of right female breast ([facility]) 06/27/2016  . OSA on CPAP 06/26/2016  . Type 2 diabetes mellitus without complication ([facility]) 04/04/2015  . Severe obesity (BMI >= 40) ([facility]) 09/04/2014  . Essential hypertension 09/04/2014  . HLD (hyperlipidemia) 09/04/2014  . Gastroesophageal reflux disease without esophagitis 09/04/2014  . Asthma, mild persistent 09/04/2014  . Urge incontinence 09/04/2014  . Seasonal allergies 09/04/2014  . Generalized anxiety disorder 09/04/2014    ,  Ann, PTA 10/09/2016, 12:26 PM  [facility] [address] [place], [place], [address] Phone: [phone]   Fax:  [phone]  Name: Carleigh E Courtney MRN: [id number] Date of Birth: 01/08/1941    

## 2016-10-10 ENCOUNTER — Ambulatory Visit: Payer: Medicare PPO | Admitting: Physical Therapy

## 2016-10-13 ENCOUNTER — Ambulatory Visit: Payer: Medicare PPO | Admitting: Physical Therapy

## 2016-10-13 ENCOUNTER — Encounter: Payer: Self-pay | Admitting: Physical Therapy

## 2016-10-13 DIAGNOSIS — M6281 Muscle weakness (generalized): Secondary | ICD-10-CM | POA: Diagnosis not present

## 2016-10-13 DIAGNOSIS — R222 Localized swelling, mass and lump, trunk: Secondary | ICD-10-CM

## 2016-10-13 DIAGNOSIS — M25611 Stiffness of right shoulder, not elsewhere classified: Secondary | ICD-10-CM | POA: Diagnosis not present

## 2016-10-13 DIAGNOSIS — I890 Lymphedema, not elsewhere classified: Secondary | ICD-10-CM

## 2016-10-13 DIAGNOSIS — R2231 Localized swelling, mass and lump, right upper limb: Secondary | ICD-10-CM | POA: Diagnosis not present

## 2016-10-13 NOTE — Therapy (Signed)
[place], [place], [address] Phone: [phone]   Fax:  [phone]  Physical Therapy Treatment  Patient Details  Name: Tiffany Arnold MRN: [id number] Date of Birth: 09-Mar-1941 Referring Provider: Dr. Dalbert Batman  Encounter Date: 10/13/2016      PT End of Session - 10/13/16 1418    Visit Number 15   Number of Visits 21   Date for PT Re-Evaluation 10/30/16   PT Start Time 1302   PT Stop Time 1400   PT Time Calculation (min) 58 min   Activity Tolerance Patient tolerated treatment well   Behavior During Therapy [facility] for tasks assessed/performed      Past Medical History:  Diagnosis Date  . Allergy   . Arthritis   . Asthma   . Cancer ([place]) 07/29/2016   right breast  . Cataract of both eyes   . Chicken pox   . Colon polyps   . Dog bite of index finger 07/23/2016   Left index finger  . GERD (gastroesophageal reflux disease)   . Hyperlipidemia   . Hypertension   . IBS (irritable bowel syndrome)   . Phlebitis   . Pneumonia    hx of several yrs ago  . Shortness of breath dyspnea    with exertion only  . Sleep apnea    wears CPAP machine nightly    Past Surgical History:  Procedure Laterality Date  . BACK SURGERY     lower  . BREAST CYST EXCISION Right 2001   negative  . BREAST LUMPECTOMY WITH NEEDLE LOCALIZATION AND AXILLARY LYMPH NODE DISSECTION Right 07/29/2016   Procedure: RIGHT BREAST LUMPECTOMY WITH DOUBLE NEEDLE LOCALIZATION AND COMPLETE RIGHT AXILLARY LYMPH NODE DISSECTION;  Surgeon: Fanny Skates, MD;  Location: [facility];  Service: General;  Laterality: Right;  . BREAST SURGERY Left 2000   Biopsy  . BUNIONECTOMY Bilateral 1998   great toe fusion on right foot  . COLONOSCOPY W/ POLYPECTOMY    . HERNIA REPAIR  6962   [place] SINUS SURGERY  2015  . PORTACATH PLACEMENT N/A 09/05/2016   Procedure: INSERTION PORT-A-CATH;  Surgeon: Fanny Skates, MD;  [facility];   Service: General;  Laterality: N/A;  . REPLACEMENT TOTAL KNEE Bilateral 2007  . TOTAL SHOULDER REPLACEMENT Left 2007    There were no vitals filed for this visit.      Subjective Assessment - 10/13/16 1304    Subjective I took the bandages off on Saturday. Pt noticed this morning her arm was a little more swollen but not bad.    Pertinent History Right breast lumpectomy with 16 lymph nodes removed on July 29, 2016. Has had bilateral knee replacements within the past 5 years. Had left shoulder replaced    Patient Stated Goals to get rid of the stinging in her arm (pt states this goal has been met 09/25/2016)    Currently in Pain? No/denies   Pain Score 0-No pain               LYMPHEDEMA/ONCOLOGY QUESTIONNAIRE - 10/13/16 1310      Right Upper Extremity Lymphedema   15 cm Proximal to Olecranon Process 42.7 cm   10 cm Proximal to Olecranon Process 43 cm   Olecranon Process 31.5 cm   15 cm Proximal to Ulnar Styloid Process 30.6 cm   10 cm Proximal to Ulnar Styloid Process 29.6 cm   Just Proximal to Ulnar Styloid Process 18.5 cm   Across Hand  at Thumb Web Space 18.4 cm   At [place] of 2nd Digit 5.9 cm                  [facility] Adult PT Treatment/Exercise - 10/13/16 0001      Manual Therapy   Manual therapy comments Circumference measurements taken, patient measured for a velcro compression sleeve and a flat knit glove this session   Compression Bandaging Lotion applied to Rt UE, then stockinette, Elastomull to fingers 1-4, Artiflex x1 with 1/4" gray foam at anterior elbow and 1-6 , 2-10, 1/4" gray foam added under 2nd 10 cm to posterior upper arm to keep bandages from rolling down and used herring bone technique with this bandage from elbow to axilla. and 1-12 cm short stretch compression bandages from hand to axilla.                 PT Education - 10/13/16 1416    Education provided Yes   Education Details educated about different types of compression garments    Person(s) Educated Patient   Methods Explanation   Comprehension Verbalized understanding                [facility] Goals - 10/08/16 1514      [facility] Long Term Goal  #1   Title Patient with verbalize an understanding of lymphedema risk reduction precautions   Baseline attended ABC class on 09/22/2016   Time 4   Period Weeks   Status Achieved     CC Long Term Goal  #2   Title Patient will be independent in a home exercise program for range of motion and strength so that she can carry through at home    Time 4   Period Weeks   Status On-going     CC Long Term Goal  #3   Title Patient will report a decrease in pain by 50% so they can perform daily activities with greater ease   Baseline no change yet 09/04/16; 60% improvement on 09/12/16, Pt is a "whole lot better "  Pt says she is not having pain  09/25/2016   Time 4   Period Weeks   Status Achieved     CC Long Term Goal  #4   Title Patient will decrease the DASH score to <60   to demonstrate increased functional use of upper extremity   Baseline 84.09 on eval, 22.73 on 09/22/2016   Time 4   Period Weeks   Status On-going     CC Long Term Goal  #5   Title Patient will improve right  shoulder abduction to 155 degrees so that she can achieve position needed for radiation therapy.   Baseline 114 degrees 09/04/16; 143 degrees on 09/12/16  155 on 09/16/2016   Time 4   Period Weeks   Status Achieved     CC Long Term Goal  #6   Title Patient will have a circumferential reduction of  2 cm at 10 cm above the right  ulnar styloid    Baseline 31 on 09/25/2016; 28 cm 10/01/16; 27.3 on 10/08/16   Time 4   Period Weeks   Status Achieved            Plan - 10/13/16 1418    Clinical Impression Statement Patient was measured today for a velcro compression sleeve and a flat knit glove and an order was placed for both. She is eager to get compression garments. She only demonstrates an increase in edema   at her forearm today.  Reapplied bandages to help manage edema until compression garments arrive.    Clinical Impairments Affecting Rehab Potential previous total joint replacements in other joints with generalized weakness, obesity; caution of left total shoulder replacement  16 lymph nodes removed from right axilla    PT Frequency 3x / week   PT Duration 4 weeks   PT Treatment/Interventions ADLs/Self Care Home Management;Therapeutic activities;Therapeutic exercise;Taping;Manual lymph drainage;DME Instruction;Patient/family education;Passive range of motion;Compression bandaging;Scar mobilization;Manual techniques   PT Next Visit Plan continue manual lymph drainage and compression bandaging to right UE until patient receives compression garments   Consulted and Agree with Plan of Care Patient      Patient will benefit from skilled therapeutic intervention in order to improve the following deficits and impairments:  Obesity, Increased edema, Decreased knowledge of use of DME, Decreased knowledge of precautions, Decreased strength, Postural dysfunction, Impaired UE functional use  Visit Diagnosis: Localized swelling, mass and lump, trunk  Lymphedema, not elsewhere classified     Problem List Patient Active Problem List   Diagnosis Date Noted  . Breast cancer of lower-inner quadrant of right female breast ([facility]) 06/27/2016  . OSA on CPAP 06/26/2016  . Type 2 diabetes mellitus without complication ([facility]) 04/04/2015  . Severe obesity (BMI >= 40) ([facility]) 09/04/2014  . Essential hypertension 09/04/2014  . HLD (hyperlipidemia) 09/04/2014  . Gastroesophageal reflux disease without esophagitis 09/04/2014  . Asthma, mild persistent 09/04/2014  . Urge incontinence 09/04/2014  . Seasonal allergies 09/04/2014  . Generalized anxiety disorder 09/04/2014    Oluwanifemi Susman L Breedlove 10/13/2016, 2:22 PM  [facility] [address] [place], [place], [address] Phone:  [phone]   Fax:  [phone]  Name: Doloras E Placeres MRN: [id number] Date of Birth: 07/27/1941  Fynn Vanblarcom Breedlove, PT 10/13/16 2:22 PM    

## 2016-10-15 ENCOUNTER — Other Ambulatory Visit: Payer: Self-pay | Admitting: Hematology

## 2016-10-15 ENCOUNTER — Ambulatory Visit: Payer: Medicare PPO

## 2016-10-15 DIAGNOSIS — C50311 Malignant neoplasm of lower-inner quadrant of right female breast: Secondary | ICD-10-CM

## 2016-10-15 DIAGNOSIS — I890 Lymphedema, not elsewhere classified: Secondary | ICD-10-CM | POA: Diagnosis not present

## 2016-10-15 DIAGNOSIS — R2231 Localized swelling, mass and lump, right upper limb: Secondary | ICD-10-CM | POA: Diagnosis not present

## 2016-10-15 DIAGNOSIS — R222 Localized swelling, mass and lump, trunk: Secondary | ICD-10-CM | POA: Diagnosis not present

## 2016-10-15 DIAGNOSIS — M6281 Muscle weakness (generalized): Secondary | ICD-10-CM | POA: Diagnosis not present

## 2016-10-15 DIAGNOSIS — M25611 Stiffness of right shoulder, not elsewhere classified: Secondary | ICD-10-CM | POA: Diagnosis not present

## 2016-10-15 NOTE — Therapy (Signed)
[place], [place], [address] Phone: [phone]   Fax:  [phone]  Physical Therapy Treatment  Patient Details  Name: Tiffany Arnold MRN: [id number] Date of Birth: 02-06-1941 Referring Provider: Dr. Dalbert Batman  Encounter Date: 10/15/2016      PT End of Session - 10/15/16 1359    Visit Number 16   Number of Visits 21   Date for PT Re-Evaluation 10/30/16   PT Start Time 1304   PT Stop Time 1352   PT Time Calculation (min) 48 min   Activity Tolerance Patient tolerated treatment well   Behavior During Therapy [facility] for tasks assessed/performed      Past Medical History:  Diagnosis Date  . Allergy   . Arthritis   . Asthma   . Cancer ([place]) 07/29/2016   right breast  . Cataract of both eyes   . Chicken pox   . Colon polyps   . Dog bite of index finger 07/23/2016   Left index finger  . GERD (gastroesophageal reflux disease)   . Hyperlipidemia   . Hypertension   . IBS (irritable bowel syndrome)   . Phlebitis   . Pneumonia    hx of several yrs ago  . Shortness of breath dyspnea    with exertion only  . Sleep apnea    wears CPAP machine nightly    Past Surgical History:  Procedure Laterality Date  . BACK SURGERY     lower  . BREAST CYST EXCISION Right 2001   negative  . BREAST LUMPECTOMY WITH NEEDLE LOCALIZATION AND AXILLARY LYMPH NODE DISSECTION Right 07/29/2016   Procedure: RIGHT BREAST LUMPECTOMY WITH DOUBLE NEEDLE LOCALIZATION AND COMPLETE RIGHT AXILLARY LYMPH NODE DISSECTION;  Surgeon: Fanny Skates, MD;  Location: [place];  Service: General;  Laterality: Right;  . BREAST SURGERY Left 2000   Biopsy  . BUNIONECTOMY Bilateral 1998   great toe fusion on right foot  . COLONOSCOPY W/ POLYPECTOMY    . HERNIA REPAIR  [id number]   [place] SINUS SURGERY  2015  . PORTACATH PLACEMENT N/A 09/05/2016   Procedure: INSERTION PORT-A-CATH;  Surgeon: Fanny Skates, MD;  Location[facility];   Service: General;  Laterality: N/A;  . REPLACEMENT TOTAL KNEE Bilateral 2007  . TOTAL SHOULDER REPLACEMENT Left 2007    There were no vitals filed for this visit.      Subjective Assessment - 10/15/16 1315    Subjective Nothing new from last visit.    Pertinent History Right breast lumpectomy with 16 lymph nodes removed on July 29, 2016. Has had bilateral knee replacements within the past 5 years. Had left shoulder replaced    Patient Stated Goals to get rid of the stinging in her arm (pt states this goal has been met 09/25/2016)    Currently in Pain? No/denies               LYMPHEDEMA/ONCOLOGY QUESTIONNAIRE - 10/15/16 1315      Right Upper Extremity Lymphedema   15 cm Proximal to Olecranon Process 41.1 cm   10 cm Proximal to Olecranon Process 41.4 cm   Olecranon Process 31.7 cm   15 cm Proximal to Ulnar Styloid Process 29.7 cm   10 cm Proximal to Ulnar Styloid Process 28.5 cm   Just Proximal to Ulnar Styloid Process 19.4 cm   Across Hand at [employer] 18.2 cm   At [place] of 2nd Digit 5.9 cm  [facility] Adult PT Treatment/Exercise - 10/15/16 0001      Manual Therapy   Manual Lymphatic Drainage (MLD) In Supine: Short neck, superficial and 5 diaphragmatic breaths, Rt inguinal nodes and Rt axillo-inguinal anastomosis, Lt axillary nodes and anterior inter-axillary anastomosis, and Rt UE from dorsal hand to lateral shoulder working proximal from distal then retracing all steps.   Compression Bandaging Lotion applied to Rt UE, then stockinette, Elastomull to fingers 1-4, Artiflex x1 with 1/4" gray foam at anterior elbow and 1-6 , 2-10, 1/4" gray foam added under 2nd 10 cm to posterior upper arm to keep bandages from rolling down and used herring bone technique with this bandage from elbow to axilla. and 1-12 cm short stretch compression bandages from hand to axilla.                         [facility] Goals - 10/15/16 1408      CC  Long Term Goal  #2   Title Patient will be independent in a home exercise program for range of motion and strength so that she can carry through at home    Status On-going     [place] Term Goal  #6   Title Patient will have a circumferential reduction of  2 cm at 10 cm above the right  ulnar styloid    Baseline 31 on 09/25/2016; 28 cm 10/01/16; 27.3 on 10/08/16; 28.5 cm 10/15/16   Status Achieved            Plan - 10/15/16 1400    Clinical Impression Statement Pts circumference measurements were reduced well today throughout her Rt UE except her wrist was 0.9 cm increased. Pts compression garment has been ordered and shipped so this should arrive at the latest by early next week. Will continue to see pt until then so we can assess fit of garment and instruct pt in donning/doffing at that time.    Rehab Potential Good   Clinical Impairments Affecting Rehab Potential previous total joint replacements in other joints with generalized weakness, obesity; caution of left total shoulder replacement  16 lymph nodes removed from right axilla    PT Frequency 3x / week   PT Duration 4 weeks   PT Treatment/Interventions ADLs/Self Care Home Management;Therapeutic activities;Therapeutic exercise;Taping;Manual lymph drainage;DME Instruction;Patient/family education;Passive range of motion;Compression bandaging;Scar mobilization;Manual techniques   PT Next Visit Plan Continue manual lymph drainage and compression bandaging to right UE until patient receives compression garments. And then possibly have her schedule one more visit 2-3 weeks after that to reassess circumference and how pt is doing with daily wear.   Recommended Other Services Going to fax pts receipt to [facility] for her to get refunded for her compression garment as she has been approved.   Consulted and Agree with Plan of Care Patient      Patient will benefit from skilled therapeutic intervention in order to improve the following deficits  and impairments:  Obesity, Increased edema, Decreased knowledge of use of DME, Decreased knowledge of precautions, Decreased strength, Postural dysfunction, Impaired UE functional use  Visit Diagnosis: Localized swelling, mass and lump, trunk  Lymphedema, not elsewhere classified     Problem List Patient Active Problem List   Diagnosis Date Noted  . Breast cancer of lower-inner quadrant of right female breast ([place]) 06/27/2016  . OSA on CPAP 06/26/2016  . Type 2 diabetes mellitus without complication ([place]) 50/53/9767  . Severe obesity (BMI >= 40) ([place]) 09/04/2014  .  Essential hypertension 09/04/2014  . HLD (hyperlipidemia) 09/04/2014  . Gastroesophageal reflux disease without esophagitis 09/04/2014  . Asthma, mild persistent 09/04/2014  . Urge incontinence 09/04/2014  . Seasonal allergies 09/04/2014  . Generalized anxiety disorder 09/04/2014    Otelia Limes, PTA 10/15/2016, 2:11 PM  [place], [place], [address] Phone: [phone]   Fax:  [phone]  Name: ROCHEL PRIVETT MRN: [id number] Date of Birth: 1941-02-15

## 2016-10-16 ENCOUNTER — Other Ambulatory Visit: Payer: Self-pay | Admitting: Hematology

## 2016-10-16 DIAGNOSIS — C50311 Malignant neoplasm of lower-inner quadrant of right female breast: Secondary | ICD-10-CM

## 2016-10-17 ENCOUNTER — Ambulatory Visit: Payer: Medicare PPO | Admitting: Physical Therapy

## 2016-10-17 ENCOUNTER — Encounter: Payer: Self-pay | Admitting: Physical Therapy

## 2016-10-17 DIAGNOSIS — I890 Lymphedema, not elsewhere classified: Secondary | ICD-10-CM

## 2016-10-17 DIAGNOSIS — M25611 Stiffness of right shoulder, not elsewhere classified: Secondary | ICD-10-CM | POA: Diagnosis not present

## 2016-10-17 DIAGNOSIS — M6281 Muscle weakness (generalized): Secondary | ICD-10-CM | POA: Diagnosis not present

## 2016-10-17 DIAGNOSIS — R2231 Localized swelling, mass and lump, right upper limb: Secondary | ICD-10-CM | POA: Diagnosis not present

## 2016-10-17 DIAGNOSIS — R222 Localized swelling, mass and lump, trunk: Secondary | ICD-10-CM | POA: Diagnosis not present

## 2016-10-17 NOTE — Therapy (Signed)
[place], [place], [address] Phone: [phone]   Fax:  [phone]  Physical Therapy Treatment  Patient Details  Name: Tiffany Arnold MRN: [id number] Date of Birth: November 01, 1941 Referring Provider: Dr. Dalbert Batman  Encounter Date: 10/17/2016      PT End of Session - 10/17/16 1155    Visit Number 17   Number of Visits 21   Date for PT Re-Evaluation 10/30/16   PT Start Time 1102   PT Stop Time 1145   PT Time Calculation (min) 43 min   Activity Tolerance Patient tolerated treatment well   Behavior During Therapy [facility] for tasks assessed/performed      Past Medical History:  Diagnosis Date  . Allergy   . Arthritis   . Asthma   . Cancer ([place]) 07/29/2016   right breast  . Cataract of both eyes   . Chicken pox   . Colon polyps   . Dog bite of index finger 07/23/2016   Left index finger  . GERD (gastroesophageal reflux disease)   . Hyperlipidemia   . Hypertension   . IBS (irritable bowel syndrome)   . Phlebitis   . Pneumonia    hx of several yrs ago  . Shortness of breath dyspnea    with exertion only  . Sleep apnea    wears CPAP machine nightly    Past Surgical History:  Procedure Laterality Date  . BACK SURGERY     lower  . BREAST CYST EXCISION Right 2001   negative  . BREAST LUMPECTOMY WITH NEEDLE LOCALIZATION AND AXILLARY LYMPH NODE DISSECTION Right 07/29/2016   Procedure: RIGHT BREAST LUMPECTOMY WITH DOUBLE NEEDLE LOCALIZATION AND COMPLETE RIGHT AXILLARY LYMPH NODE DISSECTION;  Surgeon: Fanny Skates, MD;  Location: [facility];  Service: General;  Laterality: Right;  . BREAST SURGERY Left 2000   Biopsy  . BUNIONECTOMY Bilateral 1998   great toe fusion on right foot  . COLONOSCOPY W/ POLYPECTOMY    . HERNIA REPAIR  [id number]   [place] SINUS SURGERY  2015  . PORTACATH PLACEMENT N/A 09/05/2016   Procedure: INSERTION PORT-A-CATH;  Surgeon: Fanny Skates, MD;  [facility];   Service: General;  Laterality: N/A;  . REPLACEMENT TOTAL KNEE Bilateral 2007  . TOTAL SHOULDER REPLACEMENT Left 2007    There were no vitals filed for this visit.      Subjective Assessment - 10/17/16 1113    Subjective I got my garments in the mail. I haven't opened them yet.    Pertinent History Right breast lumpectomy with 16 lymph nodes removed on July 29, 2016. Has had bilateral knee replacements within the past 5 years. Had left shoulder replaced    Patient Stated Goals to get rid of the stinging in her arm (pt states this goal has been met 09/25/2016)    Currently in Pain? No/denies   Pain Score 0-No pain               LYMPHEDEMA/ONCOLOGY QUESTIONNAIRE - 10/17/16 1134      Right Upper Extremity Lymphedema   15 cm Proximal to Olecranon Process 43 cm   10 cm Proximal to Olecranon Process 43 cm   Olecranon Process 32.7 cm   15 cm Proximal to Ulnar Styloid Process 30.8 cm   10 cm Proximal to Ulnar Styloid Process 28.5 cm   Just Proximal to Ulnar Styloid Process 19.5 cm   Across Hand at [employer] 18.3 cm   At  [place] of 2nd Digit 6 cm                  [facility] Adult PT Treatment/Exercise - 10/17/16 0001      Manual Therapy   Manual Therapy Edema management   Manual therapy comments Circumference measurements taken, patient measured for a velcro compression sleeve and a flat knit glove this session   Edema Management Patient received compression glove and velcro compression arm garment today and was instructed in how to use including how to don/doff and care for, assessed garment for proper fit and compression level                PT Education - 10/17/16 1153    Education provided Yes   Education Details how to wear compression garments including donning/doffing and care   Person(s) Educated Patient   Methods Explanation   Comprehension Verbalized understanding                [facility] Goals - 10/17/16 1154      CC Long Term  Goal  #1   Title Patient with verbalize an understanding of lymphedema risk reduction precautions   Baseline attended ABC class on 09/22/2016   Status Achieved     CC Long Term Goal  #2   Title Patient will be independent in a home exercise program for range of motion and strength so that she can carry through at home    Status On-going     CC Long Term Goal  #3   Title Patient will report a decrease in pain by 50% so they can perform daily activities with greater ease   Baseline no change yet 09/04/16; 60% improvement on 09/12/16, Pt is a "whole lot better "  Pt says she is not having pain  09/25/2016   Status Achieved     CC Long Term Goal  #4   Title Patient will decrease the DASH score to <60   to demonstrate increased functional use of upper extremity   Baseline 84.09 on eval, 22.73 on 09/22/2016   Time 4   Period Weeks   Status On-going     CC Long Term Goal  #5   Title Patient will improve right  shoulder abduction to 155 degrees so that she can achieve position needed for radiation therapy.   Baseline 114 degrees 09/04/16; 143 degrees on 09/12/16  155 on 09/16/2016   Status Achieved     CC Long Term Goal  #6   Title Patient will have a circumferential reduction of  2 cm at 10 cm above the right  ulnar styloid    Baseline 31 on 09/25/2016; 28 cm 10/01/16; 27.3 on 10/08/16; 28.5 cm 10/15/16   Status Achieved            Plan - 10/17/16 1155    Clinical Impression Statement Circumferential measurements taken today. Pt demonstrates very light increase in edema even though she arrived to appointment with her compression bandages intact. She received her compression garments and they were assessed for proper fit and compression level. Patient was instructed in compression schedule, donning/doffing and care.    Clinical Impairments Affecting Rehab Potential previous total joint replacements in other joints with generalized weakness, obesity; caution of left total shoulder replacement   16 lymph nodes removed from right axilla    PT Frequency 3x / week   PT Duration 4 weeks   PT Treatment/Interventions ADLs/Self Care Home Management;Therapeutic activities;Therapeutic exercise;Taping;Manual lymph drainage;DME Instruction;Patient/family  education;Passive range of motion;Compression bandaging;Scar mobilization;Manual techniques   PT Next Visit Plan Re measure and see if compression garments are managing edema, if so reduce pt to 1 more visit in 2 weeks to reassess - continue MLD to RUE   Consulted and Agree with Plan of Care Patient      Patient will benefit from skilled therapeutic intervention in order to improve the following deficits and impairments:  Obesity, Increased edema, Decreased knowledge of use of DME, Decreased knowledge of precautions, Decreased strength, Postural dysfunction, Impaired UE functional use  Visit Diagnosis: Lymphedema, not elsewhere classified     Problem List Patient Active Problem List   Diagnosis Date Noted  . Breast cancer of lower-inner quadrant of right female breast ([place]) 06/27/2016  . OSA on CPAP 06/26/2016  . Type 2 diabetes mellitus without complication ([place] [place]) 25/85/2778  . Severe obesity (BMI >= 40) ([place]) 09/04/2014  . Essential hypertension 09/04/2014  . HLD (hyperlipidemia) 09/04/2014  . Gastroesophageal reflux disease without esophagitis 09/04/2014  . Asthma, mild persistent 09/04/2014  . Urge incontinence 09/04/2014  . Seasonal allergies 09/04/2014  . Generalized anxiety disorder 09/04/2014    Alexia Freestone 10/17/2016, 11:58 AM  [place], [place], [address] Phone: [phone]   Fax:  [phone]  Name: Tiffany Arnold MRN: [id number] Date of Birth: [birth year]  Allyson Sabal, PT 10/17/16 11:59 AM

## 2016-10-20 ENCOUNTER — Encounter: Payer: Self-pay | Admitting: Physical Therapy

## 2016-10-20 ENCOUNTER — Ambulatory Visit: Payer: Medicare PPO | Admitting: Physical Therapy

## 2016-10-20 DIAGNOSIS — R222 Localized swelling, mass and lump, trunk: Secondary | ICD-10-CM | POA: Diagnosis not present

## 2016-10-20 DIAGNOSIS — M6281 Muscle weakness (generalized): Secondary | ICD-10-CM | POA: Diagnosis not present

## 2016-10-20 DIAGNOSIS — I890 Lymphedema, not elsewhere classified: Secondary | ICD-10-CM

## 2016-10-20 DIAGNOSIS — M25611 Stiffness of right shoulder, not elsewhere classified: Secondary | ICD-10-CM | POA: Diagnosis not present

## 2016-10-20 DIAGNOSIS — R2231 Localized swelling, mass and lump, right upper limb: Secondary | ICD-10-CM | POA: Diagnosis not present

## 2016-10-20 NOTE — Therapy (Signed)
[place], [place], [address] Phone: [phone]   Fax:  [phone]  Physical Therapy Treatment  Patient Details  Name: Tiffany Arnold MRN: [id number] Date of Birth: 1941-03-18 Referring Provider: Dr. Dalbert Batman  Encounter Date: 10/20/2016      PT End of Session - 10/20/16 1355    Visit Number 18   Number of Visits 21   Date for PT Re-Evaluation 10/30/16   PT Start Time 1302   PT Stop Time 1350   PT Time Calculation (min) 48 min   Activity Tolerance Patient tolerated treatment well   Behavior During Therapy [facility] for tasks assessed/performed      Past Medical History:  Diagnosis Date  . Allergy   . Arthritis   . Asthma   . Cancer ([place]) 07/29/2016   right breast  . Cataract of both eyes   . Chicken pox   . Colon polyps   . Dog bite of index finger 07/23/2016   Left index finger  . GERD (gastroesophageal reflux disease)   . Hyperlipidemia   . Hypertension   . IBS (irritable bowel syndrome)   . Phlebitis   . Pneumonia    hx of several yrs ago  . Shortness of breath dyspnea    with exertion only  . Sleep apnea    wears CPAP machine nightly    Past Surgical History:  Procedure Laterality Date  . BACK SURGERY     lower  . BREAST CYST EXCISION Right 2001   negative  . BREAST LUMPECTOMY WITH NEEDLE LOCALIZATION AND AXILLARY LYMPH NODE DISSECTION Right 07/29/2016   Procedure: RIGHT BREAST LUMPECTOMY WITH DOUBLE NEEDLE LOCALIZATION AND COMPLETE RIGHT AXILLARY LYMPH NODE DISSECTION;  Surgeon: Fanny Skates, MD;  Location: [facility];  Service: General;  Laterality: Right;  . BREAST SURGERY Left 2000   Biopsy  . BUNIONECTOMY Bilateral 1998   great toe fusion on right foot  . COLONOSCOPY W/ POLYPECTOMY    . HERNIA REPAIR  9485   [place] SINUS SURGERY  2015  . PORTACATH PLACEMENT N/A 09/05/2016   Procedure: INSERTION PORT-A-CATH;  Surgeon: Fanny Skates, MD;  [facility];   Service: General;  Laterality: N/A;  . REPLACEMENT TOTAL KNEE Bilateral 2007  . TOTAL SHOULDER REPLACEMENT Left 2007    There were no vitals filed for this visit.      Subjective Assessment - 10/20/16 1306    Subjective I have left the compression garments on the whole time. I only taken them off to shower and put on makeup. I have been trying to make sure everything is lined up and there are no gaps.    Pertinent History Right breast lumpectomy with 16 lymph nodes removed on July 29, 2016. Has had bilateral knee replacements within the past 5 years. Had left shoulder replaced    Patient Stated Goals to get rid of the stinging in her arm (pt states this goal has been met 09/25/2016)    Currently in Pain? No/denies   Pain Score 0-No pain               LYMPHEDEMA/ONCOLOGY QUESTIONNAIRE - 10/20/16 1308      Right Upper Extremity Lymphedema   15 cm Proximal to Olecranon Process 45 cm   10 cm Proximal to Olecranon Process 43 cm   Olecranon Process 32.5 cm   15 cm Proximal to Ulnar Styloid Process 31.4 cm   10 cm Proximal to Ulnar Styloid Process  29.5 cm   Just Proximal to Ulnar Styloid Process 18 cm   Across Hand at [employer] 18.3 cm   At [place] of 2nd Digit 6 cm                  [facility] Adult PT Treatment/Exercise - 10/20/16 0001      Manual Therapy   Manual therapy comments Circumference measurements taken, patient measured for a velcro compression sleeve and a flat knit glove this session   Edema Management donned pt's velcro compression sleeve and flat knit glove   Manual Lymphatic Drainage (MLD) In Supine: Short neck, superficial and 5 diaphragmatic breaths, Rt inguinal nodes and Rt axillo-inguinal anastomosis, Lt axillary nodes and anterior inter-axillary anastomosis, and Rt UE from dorsal hand to lateral shoulder working proximal from distal then retracing all steps.                PT Education - 10/20/16 1356    Education provided Yes    Education Details educated pt's husband briefly how to apply compression sleeve, also educated patient on technique to apply compression sleeve not following color coded straps but by pulling one strap and fastening the strap above it   Person(s) Educated Patient   Methods Explanation   Comprehension Verbalized understanding                [facility] Goals - 10/17/16 1154      CC Long Term Goal  #1   Title Patient with verbalize an understanding of lymphedema risk reduction precautions   Baseline attended ABC class on 09/22/2016   Status Achieved     CC Long Term Goal  #2   Title Patient will be independent in a home exercise program for range of motion and strength so that she can carry through at home    Status On-going     CC Long Term Goal  #3   Title Patient will report a decrease in pain by 50% so they can perform daily activities with greater ease   Baseline no change yet 09/04/16; 60% improvement on 09/12/16, Pt is a "whole lot better "  Pt says she is not having pain  09/25/2016   Status Achieved     CC Long Term Goal  #4   Title Patient will decrease the DASH score to <60   to demonstrate increased functional use of upper extremity   Baseline 84.09 on eval, 22.73 on 09/22/2016   Time 4   Period Weeks   Status On-going     CC Long Term Goal  #5   Title Patient will improve right  shoulder abduction to 155 degrees so that she can achieve position needed for radiation therapy.   Baseline 114 degrees 09/04/16; 143 degrees on 09/12/16  155 on 09/16/2016   Status Achieved     CC Long Term Goal  #6   Title Patient will have a circumferential reduction of  2 cm at 10 cm above the right  ulnar styloid    Baseline 31 on 09/25/2016; 28 cm 10/01/16; 27.3 on 10/08/16; 28.5 cm 10/15/16   Status Achieved            Plan - 10/20/16 1357    Clinical Impression Statement Patient's circumferential measurements have increased slightly since last session. Patient has been  compliant with her compression garment but I feel that maybe it was not applied with enough compression. Educated pt and husband how to [employer] and assess  for compression level. Patient also had some discomfort at her wrist from glove and sleeve overlap. Tried donning sleeve first then glove to see if this increased comfort.    Clinical Impairments Affecting Rehab Potential previous total joint replacements in other joints with generalized weakness, obesity; caution of left total shoulder replacement  16 lymph nodes removed from right axilla    PT Frequency 3x / week   PT Duration 4 weeks   PT Treatment/Interventions ADLs/Self Care Home Management;Therapeutic activities;Therapeutic exercise;Taping;Manual lymph drainage;DME Instruction;Patient/family education;Passive range of motion;Compression bandaging;Scar mobilization;Manual techniques   PT Next Visit Plan Re measure and see if compression garments are managing edema, if so reduce pt to 1 more visit in 2 weeks to reassess - continue MLD to RUE, assess for wrist discomfort with glove and husbands indep with donning garments    Consulted and Agree with Plan of Care Patient      Patient will benefit from skilled therapeutic intervention in order to improve the following deficits and impairments:  Obesity, Increased edema, Decreased knowledge of use of DME, Decreased knowledge of precautions, Decreased strength, Postural dysfunction, Impaired UE functional use  Visit Diagnosis: Lymphedema, not elsewhere classified     Problem List Patient Active Problem List   Diagnosis Date Noted  . Breast cancer of lower-inner quadrant of right female breast ([place]) 06/27/2016  . OSA on CPAP 06/26/2016  . Type 2 diabetes mellitus without complication ([place]) 16/06/3709  . Severe obesity (BMI >= 40) ([place]) 09/04/2014  . Essential hypertension 09/04/2014  . HLD (hyperlipidemia) 09/04/2014  . Gastroesophageal reflux disease without esophagitis 09/04/2014  .  Asthma, mild persistent 09/04/2014  . Urge incontinence 09/04/2014  . Seasonal allergies 09/04/2014  . Generalized anxiety disorder 09/04/2014    Alexia Freestone 10/20/2016, 2:00 PM  [place], [place], [address] Phone: [phone]   Fax:  [phone]  Name: Tiffany Arnold MRN: [id number] Date of Birth: 25-Dec-1940  Allyson Sabal, PT 10/20/16 2:00 PM

## 2016-10-21 ENCOUNTER — Ambulatory Visit (HOSPITAL_BASED_OUTPATIENT_CLINIC_OR_DEPARTMENT_OTHER): Payer: Medicare PPO

## 2016-10-21 ENCOUNTER — Encounter: Payer: Self-pay | Admitting: *Deleted

## 2016-10-21 ENCOUNTER — Encounter: Payer: Self-pay | Admitting: Hematology

## 2016-10-21 ENCOUNTER — Other Ambulatory Visit (HOSPITAL_BASED_OUTPATIENT_CLINIC_OR_DEPARTMENT_OTHER): Payer: Medicare PPO

## 2016-10-21 ENCOUNTER — Ambulatory Visit (HOSPITAL_BASED_OUTPATIENT_CLINIC_OR_DEPARTMENT_OTHER): Payer: Medicare PPO | Admitting: Hematology

## 2016-10-21 ENCOUNTER — Telehealth: Payer: Self-pay | Admitting: Hematology

## 2016-10-21 ENCOUNTER — Ambulatory Visit: Payer: Medicare PPO | Admitting: Nurse Practitioner

## 2016-10-21 VITALS — BP 127/48 | HR 88 | Temp 97.9°F | Resp 18 | Ht 60.0 in | Wt 248.6 lb

## 2016-10-21 DIAGNOSIS — Z9989 Dependence on other enabling machines and devices: Secondary | ICD-10-CM

## 2016-10-21 DIAGNOSIS — Z95828 Presence of other vascular implants and grafts: Secondary | ICD-10-CM

## 2016-10-21 DIAGNOSIS — G4733 Obstructive sleep apnea (adult) (pediatric): Secondary | ICD-10-CM

## 2016-10-21 DIAGNOSIS — E1165 Type 2 diabetes mellitus with hyperglycemia: Secondary | ICD-10-CM

## 2016-10-21 DIAGNOSIS — C50311 Malignant neoplasm of lower-inner quadrant of right female breast: Secondary | ICD-10-CM

## 2016-10-21 DIAGNOSIS — Z5111 Encounter for antineoplastic chemotherapy: Secondary | ICD-10-CM

## 2016-10-21 DIAGNOSIS — I10 Essential (primary) hypertension: Secondary | ICD-10-CM

## 2016-10-21 DIAGNOSIS — C773 Secondary and unspecified malignant neoplasm of axilla and upper limb lymph nodes: Secondary | ICD-10-CM | POA: Diagnosis not present

## 2016-10-21 DIAGNOSIS — Z170 Estrogen receptor positive status [ER+]: Secondary | ICD-10-CM | POA: Diagnosis not present

## 2016-10-21 DIAGNOSIS — I890 Lymphedema, not elsewhere classified: Secondary | ICD-10-CM | POA: Diagnosis not present

## 2016-10-21 DIAGNOSIS — E119 Type 2 diabetes mellitus without complications: Secondary | ICD-10-CM

## 2016-10-21 LAB — COMPREHENSIVE METABOLIC PANEL
ALT: 31 U/L (ref 0–55)
ANION GAP: 11 meq/L (ref 3–11)
AST: 13 U/L (ref 5–34)
Albumin: 3.3 g/dL — ABNORMAL LOW (ref 3.5–5.0)
Alkaline Phosphatase: 58 U/L (ref 40–150)
BUN: 31.8 mg/dL — AB (ref 7.0–26.0)
CALCIUM: 10.1 mg/dL (ref 8.4–10.4)
CHLORIDE: 102 meq/L (ref 98–109)
CO2: 24 meq/L (ref 22–29)
Creatinine: 0.8 mg/dL (ref 0.6–1.1)
EGFR: 74 mL/min/{1.73_m2} — ABNORMAL LOW (ref >90)
Glucose: 207 mg/dl — ABNORMAL HIGH (ref 70–140)
POTASSIUM: 4.1 meq/L (ref 3.5–5.1)
Sodium: 138 mEq/L (ref 136–145)
Total Bilirubin: 0.26 mg/dL (ref 0.20–1.20)
Total Protein: 6.5 g/dL (ref 6.4–8.3)

## 2016-10-21 LAB — CBC WITH DIFFERENTIAL/PLATELET
BASO%: 0 % (ref 0.0–2.0)
Basophils Absolute: 0 10*3/uL (ref 0.0–0.1)
EOS%: 0 % (ref 0.0–7.0)
Eosinophils Absolute: 0 10*3/uL (ref 0.0–0.5)
HEMATOCRIT: 34.3 % — AB (ref 34.8–46.6)
HGB: 11.2 g/dL — ABNORMAL LOW (ref 11.6–15.9)
LYMPH%: 13.3 % — AB (ref 14.0–49.7)
MCH: 29.1 pg (ref 25.1–34.0)
MCHC: 32.7 g/dL (ref 31.5–36.0)
MCV: 89.1 fL (ref 79.5–101.0)
MONO#: 0.2 10*3/uL (ref 0.1–0.9)
MONO%: 2.2 % (ref 0.0–14.0)
NEUT#: 8.4 10*3/uL — ABNORMAL HIGH (ref 1.5–6.5)
NEUT%: 84.5 % — AB (ref 38.4–76.8)
Platelets: 305 10*3/uL (ref 145–400)
RBC: 3.85 10*6/uL (ref 3.70–5.45)
RDW: 14.1 % (ref 11.2–14.5)
WBC: 10 10*3/uL (ref 3.9–10.3)
lymph#: 1.3 10*3/uL (ref 0.9–3.3)
nRBC: 0 % (ref 0–0)

## 2016-10-21 MED ORDER — SODIUM CHLORIDE 0.9% FLUSH
10.0000 mL | INTRAVENOUS | Status: DC | PRN
  Administered 2016-10-21: 10 mL
  Filled 2016-10-21: qty 10

## 2016-10-21 MED ORDER — HEPARIN SOD (PORK) LOCK FLUSH 100 UNIT/ML IV SOLN
500.0000 [IU] | Freq: Once | INTRAVENOUS | Status: AC | PRN
  Administered 2016-10-21: 500 [IU]
  Filled 2016-10-21: qty 5

## 2016-10-21 MED ORDER — SODIUM CHLORIDE 0.9 % IV SOLN
Freq: Once | INTRAVENOUS | Status: AC
  Administered 2016-10-21: 11:00:00 via INTRAVENOUS

## 2016-10-21 MED ORDER — DOCETAXEL CHEMO INJECTION 160 MG/16ML
75.0000 mg/m2 | Freq: Once | INTRAVENOUS | Status: AC
  Administered 2016-10-21: 160 mg via INTRAVENOUS
  Filled 2016-10-21: qty 16

## 2016-10-21 MED ORDER — SODIUM CHLORIDE 0.9 % IV SOLN
600.0000 mg/m2 | Freq: Once | INTRAVENOUS | Status: AC
  Administered 2016-10-21: 1300 mg via INTRAVENOUS
  Filled 2016-10-21: qty 65

## 2016-10-21 MED ORDER — DEXAMETHASONE SODIUM PHOSPHATE 10 MG/ML IJ SOLN
INTRAMUSCULAR | Status: AC
  Filled 2016-10-21: qty 1

## 2016-10-21 MED ORDER — PALONOSETRON HCL INJECTION 0.25 MG/5ML
INTRAVENOUS | Status: AC
  Filled 2016-10-21: qty 5

## 2016-10-21 MED ORDER — SODIUM CHLORIDE 0.9% FLUSH
10.0000 mL | INTRAVENOUS | Status: DC | PRN
Start: 2016-10-21 — End: 2016-10-21
  Administered 2016-10-21: 10 mL via INTRAVENOUS
  Filled 2016-10-21: qty 10

## 2016-10-21 MED ORDER — PALONOSETRON HCL INJECTION 0.25 MG/5ML
0.2500 mg | Freq: Once | INTRAVENOUS | Status: AC
  Administered 2016-10-21: 0.25 mg via INTRAVENOUS

## 2016-10-21 MED ORDER — DEXAMETHASONE SODIUM PHOSPHATE 10 MG/ML IJ SOLN
10.0000 mg | Freq: Once | INTRAMUSCULAR | Status: AC
  Administered 2016-10-21: 10 mg via INTRAVENOUS

## 2016-10-21 NOTE — Progress Notes (Signed)
[place]  [phone] [phone]  Clinic Follow Up Note   Patient Care Team: [name], NP as PCP - General (Internal Medicine) 10/21/2016   CHIEF COMPLAINTS:  Follow Up right breast cancer  . Oncology History   Breast cancer of lower-inner quadrant of right female breast [facility])   Staging form: Breast, AJCC 7th Edition   - Clinical stage from 06/09/2016: Stage IIB (T2, N1, M0) - Signed by Truitt Merle, MD on 06/27/2016   - Pathologic stage from 07/29/2016: Stage IIA (T1c, N1a, cM0) - Signed by Truitt Merle, MD on 08/13/2016       Breast cancer of lower-inner quadrant of right female breast ([place])   05/23/2016 Mammogram    Diagnostic mammogram and ultrasound showed suspicious architectural distortion within the right breast lower inner quadrant, measuring 1.3 cm, without sonographic corelate.      06/03/2016 Initial Biopsy    Right breast inferior lower quadrant core needle biopsy showed atypical ductal hyperplasia with calcifications      06/09/2016 Receptors her2    Breast biopsy showed ER 100% positive, PR 100% positive, HER-2 negative, Ki-67 40%      06/09/2016 Initial Diagnosis    Breast cancer of lower-inner quadrant of right female breast ([place])      06/09/2016 Initial Biopsy    Right breast inner quadrant core needle biopsy showed invasive ductal carcinoma and DCIS, grade 1-2      06/17/2016 Initial Biopsy    Right axillary lymph node core needle biopsy showed metastatic carcinoma      06/17/2016 Receptors her2    Axillary node biopsy showed ER 100% positive, PR 95% positive, HER-2 negative      06/19/2016 Imaging    Bilateral breast MRI showed locations in the lower inner right breast, largest 2.7X1.3X1.6cm, biopsy clips in 2 of this areas. There are abnormal right axillary lymph nodes showing second cortical's, no evidence of malignancy in the left breast.      07/29/2016 Surgery    Right lumpectomy and ALND      07/29/2016  Pathology Results    Right lumpectomy showed G3 IDC, DCIS, margins (-), LVI(-).  1 of 16 nodes was positive       07/29/2016 Miscellaneous    Mammaprint showed high risk disease, luminal type B      09/09/2016 -  Adjuvant Chemotherapy    Docetaxel and Cytoxan (TC) every 3 weeks       HISTORY OF PRESENTING ILLNESS:  Tiffany Arnold 75 y.o. female is here because of her recently diagnosed left breast cancer. She presents to my clinic with her friend, who is a Marine scientist.   Her cancer was discovered by screening mammogram. She had a right breast cyst in 2001, which was removed. She has been doing mammogram once a year. The mammogram and ultrasound on 05/23/2016 showed a suspicious architectural this portion, 1.3 cm, without sonographic correlation. She underwent core needle biopsy of the right breast mass twice and right axilla node biopsy, one breast biopsy and node biopsy showed invasive ductal carcinoma and DCIS, ER/PR strong positive, HER-2 negative.  She denies any other new symptoms. She has noticed mild fatigued lately, she still works full time in a vet's office, she has IBS, has intermittent constipation and diarrhea. She has arthritis, and both knee replacement and shoulder surgery before, she also has some back pain lately, she takes tylenol occasionally.  She lives with her husband, moderately active. No family history of breast cancer  GYN HISTORY  Menarchal: 13 LMP: 63 Contraceptive: 4-5 years HRT: 3 years  G2P2: no breast feeding, daughter 86 yo and son is 35 yo.   CURRENT THERAPY: Adjuvant chemotherapy with docetaxel and Cytoxan (TC) every 3 weeks, starting on 09/09/2069  INTERIM HISTORY: Tiffany Arnold returns for follow up and second cycle chemo. She tolerated first cycle chemo very well, she had mild diarrhea for a few days,  Resolved, mild nausea, no vomiting, or other side effects. She developed worsening right lymphedema a week ago, and she has Started physical therapy. Her  right arm and hand is wrapped, no other new complaints.  MEDICAL HISTORY:  Past Medical History:  Diagnosis Date  . Allergy   . Arthritis   . Asthma   . Cancer ([place]) 07/29/2016   right breast  . Cataract of both eyes   . Chicken pox   . Colon polyps   . Dog bite of index finger 07/23/2016   Left index finger  . GERD (gastroesophageal reflux disease)   . Hyperlipidemia   . Hypertension   . IBS (irritable bowel syndrome)   . Phlebitis   . Pneumonia    hx of several yrs ago  . Shortness of breath dyspnea    with exertion only  . Sleep apnea    wears CPAP machine nightly    SURGICAL HISTORY: Past Surgical History:  Procedure Laterality Date  . BACK SURGERY     lower  . BREAST CYST EXCISION Right 2001   negative  . BREAST LUMPECTOMY WITH NEEDLE LOCALIZATION AND AXILLARY LYMPH NODE DISSECTION Right 07/29/2016   Procedure: RIGHT BREAST LUMPECTOMY WITH DOUBLE NEEDLE LOCALIZATION AND COMPLETE RIGHT AXILLARY LYMPH NODE DISSECTION;  Surgeon: Fanny Skates, MD;  Location: [facility];  Service: General;  Laterality: Right;  . BREAST SURGERY Left 2000   Biopsy  . BUNIONECTOMY Bilateral 1998   great toe fusion on right foot  . COLONOSCOPY W/ POLYPECTOMY    . HERNIA REPAIR  [id number]   [place] SINUS SURGERY  2015  . PORTACATH PLACEMENT N/A 09/05/2016   Procedure: INSERTION PORT-A-CATH;  Surgeon: Fanny Skates, MD;  Location: [facility];  Service: General;  Laterality: N/A;  . REPLACEMENT TOTAL KNEE Bilateral 2007  . TOTAL SHOULDER REPLACEMENT Left 2007    SOCIAL HISTORY: Social History   Social History  . Marital status: Married    Spouse name: N/A  . Number of children: N/A  . Years of education: N/A   Occupational History  . Not on file.   Social History Main Topics  . Smoking status: Former Smoker    Packs/day: 2.00    Years: 25.00    Quit date: 12/22/1990  . Smokeless tobacco: Never Used     Comment: quit 24 years ago  . Alcohol use Yes     Comment: rare  . Drug  use: No  . Sexual activity: Not Currently   Other Topics Concern  . Not on file   Social History Narrative  . No narrative on file    FAMILY HISTORY: Family History  Problem Relation Age of Onset  . Arthritis Mother   . Stroke Mother   . Hypertension Mother   . Cancer Father     Prostate  . Stroke Father   . Hypertension Father   . Hypertension Maternal Grandmother   . Rheum arthritis Maternal Grandfather   . Stroke Maternal Grandfather   . Hypertension Maternal Grandfather   . Cancer Paternal Grandmother  Colon  . Hypertension Paternal Grandmother   . Hypertension Paternal Grandfather   . Breast cancer Neg Hx     ALLERGIES:  is allergic to other.  MEDICATIONS:  Current Outpatient Prescriptions  Medication Sig Dispense Refill  . albuterol (PROAIR HFA) 108 (90 BASE) MCG/ACT inhaler Inhale 2 puffs into the lungs every 6 (six) hours as needed for wheezing.     Marland Kitchen albuterol-ipratropium (COMBIVENT) 18-103 MCG/ACT inhaler Inhale 2 puffs into the lungs every 4 (four) hours as needed for wheezing or shortness of breath.     . ALPRAZolam (XANAX) 0.25 MG tablet TAKE ONE TABLET BY MOUTH TWICE DAILY AS NEEDED 60 tablet 0  . Calcium Carbonate-Vitamin D (CALCIUM-VITAMIN D) 500-200 MG-UNIT tablet Take 1 tablet by mouth daily.    . cetirizine (ZYRTEC) 10 MG tablet Take 10 mg by mouth daily.    Marland Kitchen dexamethasone (DECADRON) 4 MG tablet Take 1 tablet (4 mg total) by mouth 2 (two) times daily. Start the day before Taxotere. Then again the day after chemo for 3 days. 30 tablet 1  . EPINEPHrine 0.3 mg/0.3 mL IJ SOAJ injection Inject 0.3 mg into the muscle once as needed (ALLERGIC REACTION).     Marland Kitchen glucosamine-chondroitin 500-400 MG tablet Take 1 tablet by mouth 2 (two) times daily.    Marland Kitchen HYDROcodone-acetaminophen (NORCO/VICODIN) 5-325 MG tablet Take 1-2 tablets by mouth every 4 (four) hours as needed. 50 tablet 0  . lidocaine-prilocaine (EMLA) cream Apply to port at least 1 hour prior to chemo  30 g 1  . magic mouthwash SOLN Swish and Spit 5-10 mLs 4 times a day as needed. 240 mL 1  . metFORMIN (GLUCOPHAGE) 1000 MG tablet Take 1/2 tab BID x 2 weeks then increase to 1 tab BID. #60 30 tablet 2  . methocarbamol (ROBAXIN) 500 MG tablet Take 1 tablet (500 mg total) by mouth at bedtime as needed for muscle spasms. 10 tablet 0  . Olmesartan-Amlodipine-HCTZ 40-5-25 MG TABS TAKE ONE TABLET BY MOUTH ONCE DAILY 90 tablet 1  . Omega-3 Fatty Acids (FISH OIL) 1000 MG CAPS Take 1,000 mg by mouth daily.    . ondansetron (ZOFRAN) 8 MG tablet TAKE ONE TABLET BY MOUTH TWICE DAILY AS NEEDED FOR  REFRACTORY  NAUSEA/VOMITING.  START  ON  DAY  3  AFTER  CHEMO. 30 tablet 1  . oxybutynin (DITROPAN-XL) 10 MG 24 hr tablet TAKE ONE TABLET BY MOUTH AT BEDTIME 90 tablet 1  . pravastatin (PRAVACHOL) 40 MG tablet TAKE ONE TABLET BY MOUTH ONCE DAILY 30 tablet 5  . prochlorperazine (COMPAZINE) 10 MG tablet TAKE ONE TABLET BY MOUTH EVERY 6 HOURS AS NEEDED FOR NAUSEA AND VOMITING 30 tablet 1  . ranitidine (ZANTAC) 150 MG tablet Take 150 mg by mouth every evening.     . vitamin B-12 (CYANOCOBALAMIN) 1000 MCG tablet Take 1,000 mcg by mouth daily.     No current facility-administered medications for this visit.     REVIEW OF SYSTEMS:   Constitutional: Denies fevers, chills or abnormal night sweats Eyes: Denies blurriness of vision, double vision or watery eyes Ears, nose, mouth, throat, and face: Denies mucositis or sore throat Respiratory: Denies cough, dyspnea or wheezes Cardiovascular: Denies palpitation, chest discomfort or lower extremity swelling Gastrointestinal:  Denies nausea, heartburn or change in bowel habits Skin: Denies abnormal skin rashes Lymphatics: Denies new lymphadenopathy or easy bruising Neurological:Denies numbness, tingling or new weaknesses Behavioral/Psych: Mood is stable, no new changes  All other systems were reviewed with the patient  and are negative.  PHYSICAL EXAMINATION: ECOG  PERFORMANCE STATUS: 1  There were no vitals filed for this visit. There were no vitals filed for this visit.  GENERAL:alert, no distress and comfortable SKIN: skin color, texture, turgor are normal, no rashes or significant lesions EYES: normal, conjunctiva are pink and non-injected, sclera clear OROPHARYNX:no exudate, no erythema and lips, buccal mucosa, and tongue normal  NECK: supple, thyroid normal size, non-tender, without nodularity LYMPH:  no palpable lymphadenopathy in the cervical, axillary or inguinal LUNGS: clear to auscultation and percussion with normal breathing effort HEART: regular rate & rhythm and no murmurs and no lower extremity edema ABDOMEN:abdomen soft, non-tender and normal bowel sounds Musculoskeletal:no cyanosis of digits and no clubbing  PSYCH: alert & oriented x 3 with fluent speech NEURO: no focal motor/sensory deficits Breasts: Breast inspection showed them to be symmetrical with no nipple discharge. (+) incision sites in right breast and axilla are healing well. Palpation of the breasts and axilla revealed no obvious mass that I could appreciate.   LABORATORY DATA:  I have reviewed the data as listed CBC Latest Ref Rng & Units 09/30/2016 09/16/2016 09/03/2016  WBC 3.9 - 10.3 10e3/uL 10.0 22.5(H) 7.4  Hemoglobin 11.6 - 15.9 g/dL 12.5 12.9 13.8  Hematocrit 34.8 - 46.6 % 37.9 39.5 42.0  Platelets 145 - 400 10e3/uL 513(H) 233 263   CMP Latest Ref Rng & Units 09/30/2016 09/16/2016 09/03/2016  Glucose 70 - 140 mg/dl 193(H) 150(H) 113  BUN 7.0 - 26.0 mg/dL 22.4 17.0 22.2  Creatinine 0.6 - 1.1 mg/dL 0.8 1.1 0.8  Sodium 136 - 145 mEq/L 138 137 140  Potassium 3.5 - 5.1 mEq/L 4.0 4.2 4.4  Chloride 101 - 111 mmol/L - - -  CO2 22 - 29 mEq/L 25 26 31(H)  Calcium 8.4 - 10.4 mg/dL 10.1 9.9 10.5(H)  Total Protein 6.4 - 8.3 g/dL 6.8 6.4 7.2  Total Bilirubin 0.20 - 1.20 mg/dL 0.35 0.44 0.40  Alkaline Phos 40 - 150 U/L 71 112 74  AST 5 - 34 U/L 15 17 15   ALT 0 - 55 U/L  19 22 16     PATHOLOGY REPORT  Diagnosis 06/03/2016 Breast, right, needle core biopsy, ILQ focal 1.3 cm asymmetry/distortion - ATYPICAL DUCTAL HYPERPLASIA WITH CALCIFICATIONS. - FIBROCYSTIC CHANGES WITH CALCIFICATIONS. - SEE COMMENT. Microscopic Comment The results were called to [facility] on 06/04/16. (JBK:ds 06/04/16)   Diagnosis 06/09/2016 Breast, right, needle core biopsy, inner - INVASIVE DUCTAL CARCINOMA. - DUCTAL CARCINOMA IN SITU. - SEE COMMENT. Microscopic Comment The carcinoma appears grade 1-2. A breast prognostic profile will be performed and the results reported separately. The results were called to [facility] on 06/10/2016. (JBK:ecj 06/10/2016) Results: HER2 - NEGATIVE RATIO OF HER2/CEP17 SIGNALS 1.55 AVERAGE HER2 COPY NUMBER PER CELL 2.25  Results: IMMUNOHISTOCHEMICAL AND MORPHOMETRIC ANALYSIS PERFORMED MANUALLY Estrogen Receptor: 100%, POSITIVE, STRONG STAINING INTENSITY Progesterone Receptor: 100%, POSITIVE, STRONG STAINING INTENSITY Proliferation Marker Ki67: 40%   Diagnosis 06/17/2016 Lymph node, needle/core biopsy, right, inferior, axilla to far lateral breast - METASTATIC CARCINOMA, SEE COMMENT. Microscopic Comment The morphology is consistent with the patient breast carcinoma. Prognostic markers will be ordered and reported in an addendum. The case was called to [facility] on 06/18/2016. Results: IMMUNOHISTOCHEMICAL AND MORPHOMETRIC ANALYSIS PERFORMED MANUALLY Estrogen Receptor: 100%, POSITIVE, STRONG STAINING INTENSITY Progesterone Receptor: 95%, POSITIVE, STRONG STAINING INTENSITY  Results: HER2 - NEGATIVE RATIO OF HER2/CEP17 SIGNALS 1.25 AVERAGE HER2 COPY NUMBER PER CELL 2.00  Diagnosis 07/29/2016 1. Breast, lumpectomy, Right INVASIVE DUCTAL CARCINOMA, GRADE 3, SPANNING 1.5 CM DUCTAL CARCINOMA IN SITU IS PRESENT ALL MARGINS OF RESECTION ARE NEGATIVE FOR CARCINOMA 2. Lymph nodes,  regional resection, Right axillary contents METASTATIC BREAST DUCTAL CARCINOMA IN ONE OF SIXTEEN LYMPH NODES (1/16) Specimen, including laterality and lymph node sampling (sentinel, non-sentinel): Right partial breast and regional lymph nodes Procedure: Lumpectomy Histologic type: Ductal carcinoma Grade: 3 Tubule formation: 3 Nuclear pleomorphism: 2 Mitotic:3 Tumor size (gross measurement or glass slide measurement): 1.5 cm Margins: Invasive, distance to closest margin: 0.7 cm In-situ, distance to closest margin: 0.7 cm If margin positive, focally or broadly: NA Lymphovascular invasion: Not identified Ductal carcinoma in situ: Present Grade: 3 Extensive intraductal component: moderate Lobular neoplasia: Negative Tumor focality: Focal Treatment effect: Negative If present, treatment effect in breast tissue, lymph nodes or both: NA Extent of tumor: Skin: Negative Nipple: Negative Skeletal muscle: Negative Lymph nodes: Examined: 0 Sentinel 16 Non-sentinel 16 Total Lymph nodes with metastasis: 1 Isolated tumor cells (< 0.2 mm): 0 Micrometastasis: (> 0.2 mm and < 2.0 mm): 0 Macrometastasis: (> 2.0 mm): 1 Extracapsular extension: Present Breast prognostic profile: Estrogen receptor: 100% Progesterone receptor: 100% Her 2 neu: Negative Ki-67: 40% Non-neoplastic breast: Unremarkable TNM: pT1c, pN1  RADIOGRAPHIC STUDIES: I have personally reviewed the radiological images as listed and agreed with the findings in the report. US Venous Img Upper Uni Right  Result Date: 10/09/2016 CLINICAL DATA:  History of breast cancer, now with right upper extremity pain and edema for the past 2 months. Evaluate for DVT. EXAM: RIGHT UPPER EXTREMITY VENOUS DOPPLER ULTRASOUND TECHNIQUE: Gray-scale sonography with graded compression, as well as color Doppler and duplex ultrasound were performed to evaluate the upper extremity deep venous system from the level of the subclavian vein and including  the jugular, axillary, basilic, radial, ulnar and upper cephalic vein. Spectral Doppler was utilized to evaluate flow at rest and with distal augmentation maneuvers. COMPARISON:  None. FINDINGS: Contralateral Subclavian Vein: Respiratory phasicity is normal and symmetric with the symptomatic side. No evidence of thrombus. Normal compressibility. Internal Jugular Vein: No evidence of thrombus. Normal compressibility, respiratory phasicity and response to augmentation. Subclavian Vein: No evidence of thrombus. Normal compressibility, respiratory phasicity and response to augmentation. Axillary Vein: No evidence of thrombus. Normal compressibility, respiratory phasicity and response to augmentation. Cephalic Vein: No evidence of thrombus. Normal compressibility, respiratory phasicity and response to augmentation. Basilic Vein: No evidence of thrombus. Normal compressibility, respiratory phasicity and response to augmentation. Brachial Veins: No evidence of thrombus. Normal compressibility, respiratory phasicity and response to augmentation. Radial Veins: No evidence of thrombus. Normal compressibility, respiratory phasicity and response to augmentation. Ulnar Veins: No evidence of thrombus. Normal compressibility, respiratory phasicity and response to augmentation. Venous Reflux:  None visualized. Other Findings:  None visualized. IMPRESSION: No evidence of DVT within the right upper extremity. Electronically Signed   By: Sandi Mariscal M.D.   On: 10/09/2016 09:38   Diagnostic mammogram and ultrasound of right breast including right axillary 05/23/2016 IMPRESSION: Suspicious architectural distortion within the lower inner quadrant of the right breast, measuring 1.3 cm greatest dimension, without sonographic correlate. This distortion could conceivably be related to the patient's earlier surgical excision biopsy perform in 2001 (patient states that this earlier surgical excision was in this same region of her  right breast), however, exclusion of a neoplastic cause is needed.  As such, stereotactic-guided biopsy, with 3D tomosynthesis, is recommended for this suspicious finding.   ASSESSMENT & PLAN: 75 year old Caucasian  female, with mammogram discovered right breast cancer.  1. 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  -We reviewed her mammaprint genomic test result, which showed luminal type B, high risk, average 10-year risk of recurrence without adjuvant therapy is 29% -given the high risk disease, I strongly recommend her to consider adjuvant chemo. She is relatively elderly, with some medical comorbilities, but still has good PS, so I recommend adjuvant docetaxel and cytoxan every 3 weeks for 4 cycles   -the goal of chemo is curative  -She tolerated first cycle chemotherapy well, lab reviewed, adequate for treatment, we'll proceed cycle 2 today.  2. Hyperglycemia -She had a borderline hyperglycemia before. Her random glucose today was 193, this is likely steroids induced hyperglycemia. -I strongly encouraged her follow-up with her primary care physician, she agrees to call as soon as possible. I think she probably need metformin to control her blood glucose. -She is on dexamethasone 8 mg daily for 4 days around her chemo  3. Right upper extremity lymphedema -She is quite significant right upper extremity lymphedema after the axillary node dissection -Continue physical therapy, and wear sleeves   4. Hypertension and arthritis -She will continue medication and follow-up with her primary care physician -We discussed her that chemotherapy may affect her oral intake and her blood pressure, she is on 3 blood pressure  medications including HCTZ, I strongly encouraged her to monitor her blood pressure at home, and we may need to adjust her medication if her blood pressure drops.  5. Morbid obesity -I encouraged her to have healthy diet and exercise regularly, try to lose some weight.  Plan -second cycle TC today -Return to clinic in 3 weeks with lab, flush and chemotherapy -Continue physical therapy -She'll call her primary care physician to discuss her diabetic management  All questions were answered. The patient knows to call the clinic with any problems, questions or concerns. I spent 20 minutes counseling the patient face to face. The total time spent in the appointment was 25 minutes and more than 50% was on counseling.     Truitt Merle, MD 10/21/2016

## 2016-10-21 NOTE — Patient Instructions (Signed)
[facility] Discharge Instructions for Patients Receiving Chemotherapy  Today you received the following chemotherapy agents Taxotere and Cytoxan.  To help prevent nausea and vomiting after your treatment, we encourage you to take your nausea medication as prescribed.   If you develop nausea and vomiting that is not controlled by your nausea medication, call the clinic.   BELOW ARE SYMPTOMS THAT SHOULD BE REPORTED IMMEDIATELY:  *FEVER GREATER THAN 100.5 F  *CHILLS WITH OR WITHOUT FEVER  NAUSEA AND VOMITING THAT IS NOT CONTROLLED WITH YOUR NAUSEA MEDICATION  *UNUSUAL SHORTNESS OF BREATH  *UNUSUAL BRUISING OR BLEEDING  TENDERNESS IN MOUTH AND THROAT WITH OR WITHOUT PRESENCE OF ULCERS  *URINARY PROBLEMS  *BOWEL PROBLEMS  UNUSUAL RASH Items with * indicate a potential emergency and should be followed up as soon as possible.  Feel free to call the clinic you have any questions or concerns. The clinic phone number is [phone].  Please show the CHEMO ALERT CARD at check-in to the Emergency Department and triage nurse.   

## 2016-10-21 NOTE — Progress Notes (Signed)
[facility]  [phone] Fax:([phone]) [phone]  Clinic Follow Up Note   Patient Care Team: [name], NP as PCP - General (Internal Medicine) 10/21/2016   CHIEF COMPLAINTS:  Follow Up right breast cancer  . Oncology History   Breast cancer of lower-inner quadrant of right female breast [facility])   Staging form: Breast, AJCC 7th Edition   - Clinical stage from 06/09/2016: Stage IIB (T2, N1, M0) - Signed by Truitt Merle, MD on 06/27/2016   - Pathologic stage from 07/29/2016: Stage IIA (T1c, N1a, cM0) - Signed by Truitt Merle, MD on 08/13/2016       Breast cancer of lower-inner quadrant of right female breast ([place])   05/23/2016 Mammogram    Diagnostic mammogram and ultrasound showed suspicious architectural distortion within the right breast lower inner quadrant, measuring 1.3 cm, without sonographic corelate.      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 There are abnormal right axillary lymph nodes showing second cortical's, no evidence of malignancy in the left breast.      07/29/2016 Surgery    Right lumpectomy and ALND      07/29/2016  Pathology Results    Right lumpectomy showed G3 IDC, DCIS, margins (-), LVI(-).  1 of 16 nodes was positive       07/29/2016 Miscellaneous    Mammaprint showed high risk disease, luminal type B      09/09/2016 -  Adjuvant Chemotherapy    Docetaxel and Cytoxan (TC) every 3 weeks       HISTORY OF PRESENTING ILLNESS:  Tiffany Arnold 75 y.o. female is here because of her recently diagnosed left breast cancer. She presents to my clinic with her friend, who is a Marine scientist.   Her cancer was discovered by screening mammogram. She had a right breast cyst in 2001, which was removed. She has been doing mammogram once a year. The mammogram and ultrasound on 05/23/2016 showed a suspicious architectural this portion, 1.3 cm, without sonographic correlation. She underwent core needle biopsy of the right breast mass twice and right axilla node biopsy, one breast biopsy and node biopsy showed invasive ductal carcinoma and DCIS, ER/PR strong positive, HER-2 negative.  She denies any other new symptoms. She has noticed mild fatigued lately, she still works full time in a vet's office, she has IBS, has intermittent constipation and diarrhea. She has arthritis, and both knee replacement and shoulder surgery before, she also has some back pain lately, she takes tylenol occasionally.  She lives with her husband, moderately active. No family history of breast cancer  GYN HISTORY  Menarchal: 13 LMP: 83 Contraceptive: 4-5 years HRT: 3 years  G2P2: no breast feeding, daughter 84 yo and son is 89 yo.   CURRENT THERAPY: Adjuvant chemotherapy with docetaxel and Cytoxan (TC) every 3 weeks, started on 09/09/2016  INTERIM HISTORY: [name] returns for follow up and third cycle chemo. She did well with her second cycle chemo.   She does not check her glucose levels at home. She met with her primary care physician who will see her again for 3 month follow up. She is taking metformin.   She denies nausea or vomiting.  She reports phases of diarrhea about four times daily with bowel urgency. She notes having to change clothes occasionally because she was unable to get to the bathroom in time. After the diarrhea, her bowels become constipated and she has a bowel movement every other day.  She began to notice swelling on the top of her right hand after her port was placed. This swelling began to travel up her arm. She now wears a lymphedema wrap-around and glove on her right upper extremity.   She reports leg swelling, worse on the left than the right at night. This leg swelling was present before started chemo but was not as bad.   MEDICAL HISTORY:  Past Medical History:  Diagnosis Date   Allergy    Arthritis    Asthma    Cancer ([place]) 07/29/2016   right breast   Cataract of both eyes    Chicken pox    Colon polyps    Dog bite of index finger 07/23/2016   Left index finger   GERD (gastroesophageal reflux disease)    Hyperlipidemia    Hypertension    IBS (irritable bowel syndrome)    Phlebitis    Pneumonia    hx of several yrs ago   Shortness of breath dyspnea    with exertion only   Sleep apnea    wears CPAP machine nightly    SURGICAL HISTORY: Past Surgical History:  Procedure Laterality Date   BACK SURGERY     lower   BREAST CYST EXCISION Right 2001   negative   BREAST LUMPECTOMY WITH NEEDLE LOCALIZATION AND AXILLARY LYMPH NODE DISSECTION Right 07/29/2016   Procedure: RIGHT BREAST LUMPECTOMY WITH DOUBLE NEEDLE LOCALIZATION AND COMPLETE RIGHT AXILLARY LYMPH NODE DISSECTION;  Surgeon: Fanny Skates, MD;  Location: [facility];  Service: General;  Laterality: Right;   BREAST SURGERY Left 2000   Biopsy   BUNIONECTOMY Bilateral 1998   great toe fusion on right foot   COLONOSCOPY W/ POLYPECTOMY     HERNIA REPAIR  9030   [place]  2015   PORTACATH PLACEMENT N/A 09/05/2016   Procedure: INSERTION PORT-A-CATH;  Surgeon: Fanny Skates, MD;  Location:  [facility];  Service: General;  Laterality: N/A;   REPLACEMENT TOTAL KNEE Bilateral 2007   TOTAL SHOULDER REPLACEMENT Left 2007    SOCIAL HISTORY: Social History   Social History   Marital status: Married    Spouse name: N/A   Number of children: N/A   Years of education: N/A   Occupational History   Not on file.   Social History Main Topics   Smoking status: Former Smoker    Packs/day: 2.00    Years: 25.00    Quit date: 12/22/1990   Smokeless tobacco: Never Used     Comment: quit 24 years ago   Alcohol use Yes     Comment: rare  Drug use: No   Sexual activity: Not Currently   Other Topics Concern   Not on file   Social History Narrative   No narrative on file    FAMILY HISTORY: Family History  Problem Relation Age of Onset   Arthritis Mother    Stroke Mother    Hypertension Mother    Cancer Father     Prostate   Stroke Father    Hypertension Father    Hypertension Maternal Grandmother    Rheum arthritis Maternal Grandfather    Stroke Maternal Grandfather    Hypertension Maternal Grandfather    Cancer Paternal Grandmother     Colon   Hypertension Paternal Grandmother    Hypertension Paternal Grandfather    Breast cancer Neg Hx     ALLERGIES:  is allergic to other.  MEDICATIONS:  Current Outpatient Prescriptions  Medication Sig Dispense Refill   albuterol (PROAIR HFA) 108 (90 BASE) MCG/ACT inhaler Inhale 2 puffs into the lungs every 6 (six) hours as needed for wheezing.      albuterol-ipratropium (COMBIVENT) 18-103 MCG/ACT inhaler Inhale 2 puffs into the lungs every 4 (four) hours as needed for wheezing or shortness of breath.      ALPRAZolam (XANAX) 0.25 MG tablet TAKE ONE TABLET BY MOUTH TWICE DAILY AS NEEDED 60 tablet 0   Calcium Carbonate-Vitamin D (CALCIUM-VITAMIN D) 500-200 MG-UNIT tablet Take 1 tablet by mouth daily.     cetirizine (ZYRTEC) 10 MG tablet Take 10 mg by mouth daily.     dexamethasone (DECADRON) 4 MG  tablet Take 1 tablet (4 mg total) by mouth 2 (two) times daily. Start the day before Taxotere. Then again the day after chemo for 3 days. 30 tablet 1   glucosamine-chondroitin 500-400 MG tablet Take 1 tablet by mouth 2 (two) times daily.     HYDROcodone-acetaminophen (NORCO/VICODIN) 5-325 MG tablet Take 1-2 tablets by mouth every 4 (four) hours as needed. 50 tablet 0   lidocaine-prilocaine (EMLA) cream Apply to port at least 1 hour prior to chemo 30 g 1   magic mouthwash SOLN Swish and Spit 5-10 mLs 4 times a day as needed. 240 mL 1   metFORMIN (GLUCOPHAGE) 1000 MG tablet Take 1/2 tab BID x 2 weeks then increase to 1 tab BID. #60 30 tablet 2   methocarbamol (ROBAXIN) 500 MG tablet Take 1 tablet (500 mg total) by mouth at bedtime as needed for muscle spasms. 10 tablet 0   Olmesartan-Amlodipine-HCTZ 40-5-25 MG TABS TAKE ONE TABLET BY MOUTH ONCE DAILY 90 tablet 1   Omega-3 Fatty Acids (FISH OIL) 1000 MG CAPS Take 1,000 mg by mouth daily.     ondansetron (ZOFRAN) 8 MG tablet TAKE ONE TABLET BY MOUTH TWICE DAILY AS NEEDED FOR  REFRACTORY  NAUSEA/VOMITING.  START  ON  DAY  3  AFTER  CHEMO. 30 tablet 1   oxybutynin (DITROPAN-XL) 10 MG 24 hr tablet TAKE ONE TABLET BY MOUTH AT BEDTIME 90 tablet 1   pravastatin (PRAVACHOL) 40 MG tablet TAKE ONE TABLET BY MOUTH ONCE DAILY 30 tablet 5   prochlorperazine (COMPAZINE) 10 MG tablet TAKE ONE TABLET BY MOUTH EVERY 6 HOURS AS NEEDED FOR NAUSEA AND VOMITING 30 tablet 1   ranitidine (ZANTAC) 150 MG tablet Take 150 mg by mouth every evening.      vitamin B-12 (CYANOCOBALAMIN) 1000 MCG tablet Take 1,000 mcg by mouth daily.     EPINEPHrine 0.3 mg/0.3 mL IJ SOAJ injection Inject 0.3 mg into the muscle once as needed (  ALLERGIC REACTION).      No current facility-administered medications for this visit.    Facility-Administered Medications Ordered in Other Visits  Medication Dose Route Frequency Provider Last Rate Last Dose   sodium chloride flush (NS) 0.9  % injection 10 mL  10 mL Intravenous PRN Truitt Merle, MD   10 mL at 10/21/16 1005    REVIEW OF SYSTEMS:   Constitutional: Denies fevers, chills or abnormal night sweats Eyes: Denies blurriness of vision, double vision or watery eyes Ears, nose, mouth, throat, and face: Denies mucositis or sore throat Respiratory: Denies cough, dyspnea or wheezes Cardiovascular: Denies palpitation, chest discomfort (+) lower extremity swelling Gastrointestinal:  Denies nausea, heartburn or change in bowel habits Skin: Denies abnormal skin rashes Lymphatics: Denies new lymphadenopathy or easy bruising Neurological:Denies numbness, tingling or new weaknesses Behavioral/Psych: Mood is stable, no new changes  All other systems were reviewed with the patient and are negative.  PHYSICAL EXAMINATION: ECOG PERFORMANCE STATUS: 1 The blood pressure 127/48, heart rate 88, respiratory rate 18, temperature 36.6, pulse ox 92% on room air GENERAL:alert, no distress and comfortable. In wheelchair. Lymphedema wrap-around and glove on RUE. This lymphedema wrap-around is not very tight. SKIN: skin color, texture, turgor are normal, no rashes or significant lesions EYES: normal, conjunctiva are pink and non-injected, sclera clear OROPHARYNX:no exudate, no erythema and lips, buccal mucosa, and tongue normal  NECK: supple, thyroid normal size, non-tender, without nodularity LYMPH:  no palpable lymphadenopathy in the cervical, axillary or inguinal LUNGS: clear to auscultation and percussion with normal breathing effort HEART: regular rate & rhythm and no murmurs and no lower extremity edema ABDOMEN:abdomen soft, non-tender and normal bowel sounds Musculoskeletal:no cyanosis of digits and no clubbing  PSYCH: alert & oriented x 3 with fluent speech NEURO: no focal motor/sensory deficits Breasts: Breast inspection showed them to be symmetrical with no nipple discharge. (+) incision sites in right breast and axilla are healing well.  Palpation of the breasts and axilla revealed no obvious mass that I could appreciate.  Ext: (+) Right upper extremity lymphedema, she is on compressions sleeve, bilateral lower extremity trace edema below knee.  LABORATORY DATA:  I have reviewed the data as listed CBC Latest Ref Rng & Units 10/21/2016 09/30/2016 09/16/2016  WBC 3.9 - 10.3 10e3/uL 10.0 10.0 22.5(H)  Hemoglobin 11.6 - 15.9 g/dL 11.2(L) 12.5 12.9  Hematocrit 34.8 - 46.6 % 34.3(L) 37.9 39.5  Platelets 145 - 400 10e3/uL 305 513(H) 233   CMP Latest Ref Rng & Units 09/30/2016 09/16/2016 09/03/2016  Glucose 70 - 140 mg/dl 193(H) 150(H) 113  BUN 7.0 - 26.0 mg/dL 22.4 17.0 22.2  Creatinine 0.6 - 1.1 mg/dL 0.8 1.1 0.8  Sodium 136 - 145 mEq/L 138 137 140  Potassium 3.5 - 5.1 mEq/L 4.0 4.2 4.4  Chloride 101 - 111 mmol/L - - -  CO2 22 - 29 mEq/L 25 26 31(H)  Calcium 8.4 - 10.4 mg/dL 10.1 9.9 10.5(H)  Total Protein 6.4 - 8.3 g/dL 6.8 6.4 7.2  Total Bilirubin 0.20 - 1.20 mg/dL 0.35 0.44 0.40  Alkaline Phos 40 - 150 U/L 71 112 74  AST 5 - 34 U/L 15 17 15   ALT 0 - 55 U/L 19 22 16     PATHOLOGY REPORT  Diagnosis 06/03/2016 Breast, right, needle core biopsy, ILQ focal 1.3 cm asymmetry/distortion - ATYPICAL DUCTAL HYPERPLASIA WITH CALCIFICATIONS. - FIBROCYSTIC CHANGES WITH CALCIFICATIONS. - SEE COMMENT. Microscopic Comment The results were called to [facility] on 06/04/16. (JBK:ds 06/04/16)  Diagnosis 06/09/2016 Breast, right, needle core biopsy, inner - INVASIVE DUCTAL CARCINOMA. - DUCTAL CARCINOMA IN SITU. - SEE COMMENT. Microscopic Comment The carcinoma appears grade 1-2. A breast prognostic profile will be performed and the results reported separately. The results were called to [facility] on 06/10/2016. (JBK:ecj 06/10/2016) Results: HER2 - NEGATIVE RATIO OF HER2/CEP17 SIGNALS 1.55 AVERAGE HER2 COPY NUMBER PER CELL 2.25  Results: IMMUNOHISTOCHEMICAL AND MORPHOMETRIC ANALYSIS  PERFORMED MANUALLY Estrogen Receptor: 100%, POSITIVE, STRONG STAINING INTENSITY Progesterone Receptor: 100%, POSITIVE, STRONG STAINING INTENSITY Proliferation Marker Ki67: 40%   Diagnosis 06/17/2016 Lymph node, needle/core biopsy, right, inferior, axilla to far lateral breast - METASTATIC CARCINOMA, SEE COMMENT. Microscopic Comment The morphology is consistent with the patient breast carcinoma. Prognostic markers will be ordered and reported in an addendum. The case was called to [facility] on 06/18/2016. Results: IMMUNOHISTOCHEMICAL AND MORPHOMETRIC ANALYSIS PERFORMED MANUALLY Estrogen Receptor: 100%, POSITIVE, STRONG STAINING INTENSITY Progesterone Receptor: 95%, POSITIVE, STRONG STAINING INTENSITY  Results: HER2 - NEGATIVE RATIO OF HER2/CEP17 SIGNALS 1.25 AVERAGE HER2 COPY NUMBER PER CELL 2.00  Diagnosis 07/29/2016 1. Breast, lumpectomy, Right INVASIVE DUCTAL CARCINOMA, GRADE 3, SPANNING 1.5 CM DUCTAL CARCINOMA IN SITU IS PRESENT ALL MARGINS OF RESECTION ARE NEGATIVE FOR CARCINOMA 2. Lymph nodes, regional resection, Right axillary contents METASTATIC BREAST DUCTAL CARCINOMA IN ONE OF SIXTEEN LYMPH NODES (1/16) Specimen, including laterality and lymph node sampling (sentinel, non-sentinel): Right partial breast and regional lymph nodes Procedure: Lumpectomy Histologic type: Ductal carcinoma Grade: 3 Tubule formation: 3 Nuclear pleomorphism: 2 Mitotic:3 Tumor size (gross measurement or glass slide measurement): 1.5 cm Margins: Invasive, distance to closest margin: 0.7 cm In-situ, distance to closest margin: 0.7 cm If margin positive, focally or broadly: NA Lymphovascular invasion: Not identified Ductal carcinoma in situ: Present Grade: 3 Extensive intraductal component: moderate Lobular neoplasia: Negative Tumor focality: Focal Treatment effect: Negative If present, treatment effect in breast tissue, lymph nodes or both: NA Extent of tumor: Skin:  Negative Nipple: Negative Skeletal muscle: Negative Lymph nodes: Examined: 0 Sentinel 16 Non-sentinel 16 Total Lymph nodes with metastasis: 1 Isolated tumor cells (< 0.2 mm): 0 Micrometastasis: (> 0.2 mm and < 2.0 mm): 0 Macrometastasis: (> 2.0 mm): 1 Extracapsular extension: Present Breast prognostic profile: Estrogen receptor: 100% Progesterone receptor: 100% Her 2 neu: Negative Ki-67: 40% Non-neoplastic breast: Unremarkable TNM: pT1c, pN1  RADIOGRAPHIC STUDIES: I have personally reviewed the radiological images as listed and agreed with the findings in the report. US Venous Img Upper Uni Right  Result Date: 10/09/2016 CLINICAL DATA:  History of breast cancer, now with right upper extremity pain and edema for the past 2 months. Evaluate for DVT. EXAM: RIGHT UPPER EXTREMITY VENOUS DOPPLER ULTRASOUND TECHNIQUE: Gray-scale sonography with graded compression, as well as color Doppler and duplex ultrasound were performed to evaluate the upper extremity deep venous system from the level of the subclavian vein and including the jugular, axillary, basilic, radial, ulnar and upper cephalic vein. Spectral Doppler was utilized to evaluate flow at rest and with distal augmentation maneuvers. COMPARISON:  None. FINDINGS: Contralateral Subclavian Vein: Respiratory phasicity is normal and symmetric with the symptomatic side. No evidence of thrombus. Normal compressibility. Internal Jugular Vein: No evidence of thrombus. Normal compressibility, respiratory phasicity and response to augmentation. Subclavian Vein: No evidence of thrombus. Normal compressibility, respiratory phasicity and response to augmentation. Axillary Vein: No evidence of thrombus. Normal compressibility, respiratory phasicity and response to augmentation. Cephalic Vein: No evidence of thrombus. Normal compressibility, respiratory phasicity and response to  augmentation. Basilic Vein: No evidence of thrombus. Normal compressibility,  respiratory phasicity and response to augmentation. Brachial Veins: No evidence of thrombus. Normal compressibility, respiratory phasicity and response to augmentation. Radial Veins: No evidence of thrombus. Normal compressibility, respiratory phasicity and response to augmentation. Ulnar Veins: No evidence of thrombus. Normal compressibility, respiratory phasicity and response to augmentation. Venous Reflux:  None visualized. Other Findings:  None visualized. IMPRESSION: No evidence of DVT within the right upper extremity. Electronically Signed   By: Sandi Mariscal M.D.   On: 10/09/2016 09:38   Diagnostic mammogram and ultrasound of right breast including right axillary 05/23/2016 IMPRESSION: Suspicious architectural distortion within the lower inner quadrant of the right breast, measuring 1.3 cm greatest dimension, without sonographic correlate. This distortion could conceivably be related to the patient's earlier surgical excision biopsy perform in 2001 (patient states that this earlier surgical excision was in this same region of her right breast), however, exclusion of a neoplastic cause is needed.  As such, stereotactic-guided biopsy, with 3D tomosynthesis, is recommended for this suspicious finding.   ASSESSMENT & PLAN: 75 year old Caucasian female, with mammogram discovered right breast cancer.  1. 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  -We reviewed her mammaprint genomic test result, which showed luminal type B, high risk, average 10-year risk of recurrence without adjuvant therapy is 29% -given the high risk disease, I strongly recommend her to consider adjuvant chemo. She is relatively elderly, with some medical  comorbilities, but still has good PS, so I recommend adjuvant docetaxel and cytoxan every 3 weeks for 4 cycles   -the goal of chemo is curative  -She tolerated second cycle chemotherapy well, except mild to moderate diarrhea, lab reviewed, adequate for treatment, we'll proceed cycle 3 today.   2. Hyperglycemia -She had a borderline hyperglycemia before. Her random glucose today is still pending -She has met with her primary care physician who prescribed metformin and will see her again on 12/02/2016. She does not check her glucose levels at home. -She is on dexamethasone 8 mg daily for 4 days around her chemo  3. Right upper extremity lymphedema -She has quite significant right upper extremity lymphedema after the axillary node dissection -She has began wearing a lymphedema wrap-around and glove on the right upper extremity -Continue physical therapy, and wear sleeves   4. Hypertension and arthritis -She will continue medication and follow-up with her primary care physician -We discussed her that chemotherapy may affect her oral intake and her blood pressure, she is on 3 blood pressure medications including HCTZ, I strongly encouraged her to monitor her blood pressure at home, and we may need to adjust her medication if her blood pressure drops.  5. Morbid obesity -I encouraged her to have healthy diet and exercise regularly, try to lose some weight.  6. Lymphedema of the right upper extremity -She is on physical therapy for her lymphedema. -She also has mild leg edema, secondary to steroids and docetaxel, I encouraged her to raise her legs when sits, and be physically active.  Plan -third cycle TC today -Return to clinic in 3 weeks with lab, flush and chemotherapy -Continue physical therapy -Refer back to radiation oncology on next visit   All questions were answered. The patient knows to call  the clinic with any problems, questions or concerns. I spent 20 minutes counseling the  patient face to face. The total time spent in the appointment was 25 minutes and more than 50% was on counseling.  This document serves as a record of services personally performed by Truitt Merle, MD. It was created on her behalf by Arlyce Harman, a trained medical scribe. The creation of this record is based on the scribe's personal observations and the provider's statements to them. This document has been checked and approved by the attending provider.     Truitt Merle, MD 10/21/2016

## 2016-10-21 NOTE — Telephone Encounter (Signed)
Message sent to chemo scheduler to add chemo per 10/21/16 los. AVS report and appointment schedule given to patient, per 10/21/16 los.

## 2016-10-22 ENCOUNTER — Ambulatory Visit: Payer: Medicare PPO | Attending: General Surgery | Admitting: Physical Therapy

## 2016-10-22 ENCOUNTER — Encounter: Payer: Self-pay | Admitting: Physical Therapy

## 2016-10-22 ENCOUNTER — Telehealth: Payer: Self-pay | Admitting: *Deleted

## 2016-10-22 DIAGNOSIS — I890 Lymphedema, not elsewhere classified: Secondary | ICD-10-CM | POA: Insufficient documentation

## 2016-10-22 DIAGNOSIS — R222 Localized swelling, mass and lump, trunk: Secondary | ICD-10-CM | POA: Insufficient documentation

## 2016-10-22 NOTE — Therapy (Signed)
[place], [place], [address] Phone: [phone]   Fax:  [phone]  Physical Therapy Treatment  Patient Details  Name: Tiffany Arnold MRN: [id number] Date of Birth: Mar 17, 1941 Referring Provider: Dr. Dalbert Batman  Encounter Date: 10/22/2016      PT End of Session - 10/22/16 1358    Visit Number 19   Number of Visits 21   Date for PT Re-Evaluation 10/30/16   PT Start Time 1301   PT Stop Time 1348   PT Time Calculation (min) 47 min   Activity Tolerance Patient tolerated treatment well   Behavior During Therapy [facility] for tasks assessed/performed      Past Medical History:  Diagnosis Date  . Allergy   . Arthritis   . Asthma   . Cancer ([place]) 07/29/2016   right breast  . Cataract of both eyes   . Chicken pox   . Colon polyps   . Dog bite of index finger 07/23/2016   Left index finger  . GERD (gastroesophageal reflux disease)   . Hyperlipidemia   . Hypertension   . IBS (irritable bowel syndrome)   . Phlebitis   . Pneumonia    hx of several yrs ago  . Shortness of breath dyspnea    with exertion only  . Sleep apnea    wears CPAP machine nightly    Past Surgical History:  Procedure Laterality Date  . BACK SURGERY     lower  . BREAST CYST EXCISION Right 2001   negative  . BREAST LUMPECTOMY WITH NEEDLE LOCALIZATION AND AXILLARY LYMPH NODE DISSECTION Right 07/29/2016   Procedure: RIGHT BREAST LUMPECTOMY WITH DOUBLE NEEDLE LOCALIZATION AND COMPLETE RIGHT AXILLARY LYMPH NODE DISSECTION;  Surgeon: Fanny Skates, MD;  Location: [facility];  Service: General;  Laterality: Right;  . BREAST SURGERY Left 2000   Biopsy  . BUNIONECTOMY Bilateral 1998   great toe fusion on right foot  . COLONOSCOPY W/ POLYPECTOMY    . HERNIA REPAIR  [id number]   [place] SINUS SURGERY  2015  . PORTACATH PLACEMENT N/A 09/05/2016   Procedure: INSERTION PORT-A-CATH;  Surgeon: Fanny Skates, MD;  Location[facility];   Service: General;  Laterality: N/A;  . REPLACEMENT TOTAL KNEE Bilateral 2007  . TOTAL SHOULDER REPLACEMENT Left 2007    There were no vitals filed for this visit.      Subjective Assessment - 10/22/16 1306    Subjective We are going to have to do something else. My husband can not get this compression velcro sleeve on and we get in to a fight about it every night. I can not get the thing to stay up. It keeps sliding down. I haven't had any pain in my wrist.    Pertinent History Right breast lumpectomy with 16 lymph nodes removed on July 29, 2016. Has had bilateral knee replacements within the past 5 years. Had left shoulder replaced    Patient Stated Goals to get rid of the stinging in her arm (pt states this goal has been met 09/25/2016)    Currently in Pain? No/denies   Pain Score 0-No pain                         [facility] Adult PT Treatment/Exercise - 10/22/16 0001      Manual Therapy   Edema Management donned pt's velcro compression sleeve and flat knit glove. Patient states that velcro sleeve is not working for  her and her husband can not get it on properly. She wants a different option. DIscussed with patient importance of compression garment especially velcro at night since a regular (elastic) compression sleeve can not be worn overnight. Asked patient to don her velcro compression sleeve independently. Patient was able to do so with encouragement and was able to go back and tighten all straps once it was donned. Patient was measured today for an elastic compression sleeve for daytime wear and was sent with information to obtain. Patient states her glove is working properly and not causing her any trouble.    Manual Lymphatic Drainage (MLD) see education section                        [facility] Goals - 10/17/16 1154      CC Long Term Goal  #1   Title Patient with verbalize an understanding of lymphedema risk reduction precautions   Baseline  attended ABC class on 09/22/2016   Status Achieved     CC Long Term Goal  #2   Title Patient will be independent in a home exercise program for range of motion and strength so that she can carry through at home    Status On-going     CC Long Term Goal  #3   Title Patient will report a decrease in pain by 50% so they can perform daily activities with greater ease   Baseline no change yet 09/04/16; 60% improvement on 09/12/16, Pt is a "whole lot better "  Pt says she is not having pain  09/25/2016   Status Achieved     CC Long Term Goal  #4   Title Patient will decrease the DASH score to <60   to demonstrate increased functional use of upper extremity   Baseline 84.09 on eval, 22.73 on 09/22/2016   Time 4   Period Weeks   Status On-going     CC Long Term Goal  #5   Title Patient will improve right  shoulder abduction to 155 degrees so that she can achieve position needed for radiation therapy.   Baseline 114 degrees 09/04/16; 143 degrees on 09/12/16  155 on 09/16/2016   Status Achieved     CC Long Term Goal  #6   Title Patient will have a circumferential reduction of  2 cm at 10 cm above the right  ulnar styloid    Baseline 31 on 09/25/2016; 28 cm 10/01/16; 27.3 on 10/08/16; 28.5 cm 10/15/16   Status Achieved            Plan - 10/22/16 1358    Clinical Impression Statement Patient states that velcro sleeve is not working for her and her husband can not get it on properly. She wants a different option. DIscussed with patient importance of compression garment especially velcro at night since a regular (elastic) compression sleeve can not be worn overnight. Educated patient about other options for daytime and nighttime compression. Asked patient to don her velcro compression sleeve independently. Patient was able to do so with encouragement and was able to go back and tighten all straps once it was donned. Patient was measured today for an elastic compression sleeve for daytime wear and was  sent with information to obtain. Patient states her glove is working properly and not causing her any trouble.    Rehab Potential Good   PT Frequency 3x / week   PT Duration 4 weeks   PT Treatment/Interventions  ADLs/Self Care Home Management;Therapeutic activities;Therapeutic exercise;Taping;Manual lymph drainage;DME Instruction;Patient/family education;Passive range of motion;Compression bandaging;Scar mobilization;Manual techniques   PT Next Visit Plan Re measure and assess goals, give strengthening/ROM exercises, ask pt if she went to medical supply store for sleeve   Consulted and Agree with Plan of Care Patient      Patient will benefit from skilled therapeutic intervention in order to improve the following deficits and impairments:  Obesity, Increased edema, Decreased knowledge of use of DME, Decreased knowledge of precautions, Decreased strength, Postural dysfunction, Impaired UE functional use  Visit Diagnosis: Lymphedema, not elsewhere classified     Problem List Patient Active Problem List   Diagnosis Date Noted  . Breast cancer of lower-inner quadrant of right female breast ([place]) 06/27/2016  . OSA on CPAP 06/26/2016  . Type 2 diabetes mellitus without complication ([place]) 83/35/8251  . Severe obesity (BMI >= 40) ([place]) 09/04/2014  . Essential hypertension 09/04/2014  . HLD (hyperlipidemia) 09/04/2014  . Gastroesophageal reflux disease without esophagitis 09/04/2014  . Asthma, mild persistent 09/04/2014  . Urge incontinence 09/04/2014  . Seasonal allergies 09/04/2014  . Generalized anxiety disorder 09/04/2014    Alexia Freestone 10/22/2016, 2:01 PM  [place], [place], [address] Phone: [phone]   Fax:  [phone]  Name: Tiffany Arnold MRN: [id number] Date of Birth: January 07, 1941  Allyson Sabal, PT 10/22/16 2:01 PM

## 2016-10-22 NOTE — Telephone Encounter (Signed)
Per LOS I have scheduled appts and notified the scheduler 

## 2016-10-23 ENCOUNTER — Ambulatory Visit (HOSPITAL_BASED_OUTPATIENT_CLINIC_OR_DEPARTMENT_OTHER): Payer: Medicare PPO

## 2016-10-23 VITALS — BP 162/78 | HR 99 | Temp 98.2°F | Resp 18

## 2016-10-23 DIAGNOSIS — Z5189 Encounter for other specified aftercare: Secondary | ICD-10-CM | POA: Diagnosis not present

## 2016-10-23 DIAGNOSIS — C50311 Malignant neoplasm of lower-inner quadrant of right female breast: Secondary | ICD-10-CM

## 2016-10-23 DIAGNOSIS — Z170 Estrogen receptor positive status [ER+]: Principal | ICD-10-CM

## 2016-10-23 MED ORDER — PEGFILGRASTIM INJECTION 6 MG/0.6ML ~~LOC~~
6.0000 mg | PREFILLED_SYRINGE | Freq: Once | SUBCUTANEOUS | Status: AC
  Administered 2016-10-23: 6 mg via SUBCUTANEOUS
  Filled 2016-10-23: qty 0.6

## 2016-10-23 NOTE — Patient Instructions (Signed)
Pegfilgrastim injection What is this medicine? PEGFILGRASTIM (PEG fil gra stim) is a long-acting granulocyte colony-stimulating factor that stimulates the growth of neutrophils, a type of white blood cell important in the body's fight against infection. It is used to reduce the incidence of fever and infection in patients with certain types of cancer who are receiving chemotherapy that affects the bone marrow, and to increase survival after being exposed to high doses of radiation. This medicine may be used for other purposes; ask your health care provider or pharmacist if you have questions. What should I tell my health care provider before I take this medicine? They need to know if you have any of these conditions: -kidney disease -latex allergy -ongoing radiation therapy -sickle cell disease -skin reactions to acrylic adhesives (On-Body Injector only) -an unusual or allergic reaction to pegfilgrastim, filgrastim, other medicines, foods, dyes, or preservatives -pregnant or trying to get pregnant -breast-feeding How should I use this medicine? This medicine is for injection under the skin. If you get this medicine at home, you will be taught how to prepare and give the pre-filled syringe or how to use the On-body Injector. Refer to the patient Instructions for Use for detailed instructions. Use exactly as directed. Take your medicine at regular intervals. Do not take your medicine more often than directed. It is important that you put your used needles and syringes in a special sharps container. Do not put them in a trash can. If you do not have a sharps container, call your pharmacist or healthcare provider to get one. Talk to your pediatrician regarding the use of this medicine in children. While this drug may be prescribed for selected conditions, precautions do apply. Overdosage: If you think you have taken too much of this medicine contact a poison control center or emergency room at  once. NOTE: This medicine is only for you. Do not share this medicine with others. What if I miss a dose? It is important not to miss your dose. Call your doctor or health care professional if you miss your dose. If you miss a dose due to an On-body Injector failure or leakage, a new dose should be administered as soon as possible using a single prefilled syringe for manual use. What may interact with this medicine? Interactions have not been studied. Give your health care provider a list of all the medicines, herbs, non-prescription drugs, or dietary supplements you use. Also tell them if you smoke, drink alcohol, or use illegal drugs. Some items may interact with your medicine. This list may not describe all possible interactions. Give your health care provider a list of all the medicines, herbs, non-prescription drugs, or dietary supplements you use. Also tell them if you smoke, drink alcohol, or use illegal drugs. Some items may interact with your medicine. What should I watch for while using this medicine? You may need blood work done while you are taking this medicine. If you are going to need a MRI, CT scan, or other procedure, tell your doctor that you are using this medicine (On-Body Injector only). What side effects may I notice from receiving this medicine? Side effects that you should report to your doctor or health care professional as soon as possible: -allergic reactions like skin rash, itching or hives, swelling of the face, lips, or tongue -dizziness -fever -pain, redness, or irritation at site where injected -pinpoint red spots on the skin -red or dark-brown urine -shortness of breath or breathing problems -stomach or side pain, or pain   at the shoulder -swelling -tiredness -trouble passing urine or change in the amount of urine Side effects that usually do not require medical attention (report to your doctor or health care professional if they continue or are  bothersome): -bone pain -muscle pain This list may not describe all possible side effects. Call your doctor for medical advice about side effects. You may report side effects to FDA at 1-800-FDA-1088. Where should I keep my medicine? Keep out of the reach of children. Store pre-filled syringes in a refrigerator between 2 and 8 degrees C (36 and 46 degrees F). Do not freeze. Keep in carton to protect from light. Throw away this medicine if it is left out of the refrigerator for more than 48 hours. Throw away any unused medicine after the expiration date. NOTE: This sheet is a summary. It may not cover all possible information. If you have questions about this medicine, talk to your doctor, pharmacist, or health care provider.    2016, [employer]vier/Gold Standard. (2014-12-28 14:30:14)  

## 2016-10-24 ENCOUNTER — Telehealth: Payer: Self-pay | Admitting: Internal Medicine

## 2016-10-24 ENCOUNTER — Ambulatory Visit: Payer: Medicare PPO | Admitting: Physical Therapy

## 2016-10-24 ENCOUNTER — Other Ambulatory Visit: Payer: Self-pay | Admitting: Internal Medicine

## 2016-10-24 ENCOUNTER — Encounter: Payer: Self-pay | Admitting: Physical Therapy

## 2016-10-24 DIAGNOSIS — G4733 Obstructive sleep apnea (adult) (pediatric): Secondary | ICD-10-CM

## 2016-10-24 DIAGNOSIS — I890 Lymphedema, not elsewhere classified: Secondary | ICD-10-CM | POA: Diagnosis not present

## 2016-10-24 DIAGNOSIS — R222 Localized swelling, mass and lump, trunk: Secondary | ICD-10-CM | POA: Diagnosis not present

## 2016-10-24 NOTE — Telephone Encounter (Signed)
Pt sister is calling, states pt is unable to do her sleep study, due to treatment she is taking for breast cancer. Pt states she is also having problems with her machine, states it is an older machine. Please call.

## 2016-10-24 NOTE — Telephone Encounter (Signed)
Ok to phone in Xanax to be filled on or after 11/6  *please change class to "phone in"

## 2016-10-24 NOTE — Therapy (Signed)
[place], [place], [address] Phone: [phone]   Fax:  [phone]  Physical Therapy Treatment  Patient Details  Name: Tiffany Arnold MRN: [id number] Date of Birth: 23-Oct-1941 Referring Provider: Dr. Dalbert Batman  Encounter Date: 10/24/2016      PT End of Session - 10/24/16 1146    Visit Number 20   Number of Visits 21   Date for PT Re-Evaluation 10/30/16   PT Start Time 1101   PT Stop Time 1145   PT Time Calculation (min) 44 min   Activity Tolerance Patient tolerated treatment well   Behavior During Therapy [facility] for tasks assessed/performed      Past Medical History:  Diagnosis Date  . Allergy   . Arthritis   . Asthma   . Cancer ([place]) 07/29/2016   right breast  . Cataract of both eyes   . Chicken pox   . Colon polyps   . Dog bite of index finger 07/23/2016   Left index finger  . GERD (gastroesophageal reflux disease)   . Hyperlipidemia   . Hypertension   . IBS (irritable bowel syndrome)   . Phlebitis   . Pneumonia    hx of several yrs ago  . Shortness of breath dyspnea    with exertion only  . Sleep apnea    wears CPAP machine nightly    Past Surgical History:  Procedure Laterality Date  . BACK SURGERY     lower  . BREAST CYST EXCISION Right 2001   negative  . BREAST LUMPECTOMY WITH NEEDLE LOCALIZATION AND AXILLARY LYMPH NODE DISSECTION Right 07/29/2016   Procedure: RIGHT BREAST LUMPECTOMY WITH DOUBLE NEEDLE LOCALIZATION AND COMPLETE RIGHT AXILLARY LYMPH NODE DISSECTION;  Surgeon: Fanny Skates, MD;  Location: [facility];  Service: General;  Laterality: Right;  . BREAST SURGERY Left 2000   Biopsy  . BUNIONECTOMY Bilateral 1998   great toe fusion on right foot  . COLONOSCOPY W/ POLYPECTOMY    . HERNIA REPAIR  [id number]   [place] SINUS SURGERY  2015  . PORTACATH PLACEMENT N/A 09/05/2016   Procedure: INSERTION PORT-A-CATH;  Surgeon: Fanny Skates, MD;  [facility];   Service: General;  Laterality: N/A;  . REPLACEMENT TOTAL KNEE Bilateral 2007  . TOTAL SHOULDER REPLACEMENT Left 2007    There were no vitals filed for this visit.      Subjective Assessment - 10/24/16 1103    Subjective My husband put my velcro sleeve on last night. I wore it all day yesterday. I am waiting on a call from the lady about my compression sleeve.    Pertinent History Right breast lumpectomy with 16 lymph nodes removed on July 29, 2016. Has had bilateral knee replacements within the past 5 years. Had left shoulder replaced    Patient Stated Goals to get rid of the stinging in her arm (pt states this goal has been met 09/25/2016)    Currently in Pain? No/denies   Pain Score 0-No pain                  [name] - 10/24/16 0001    Open a tight or new jar Mild difficulty   Do heavy household chores (wash walls, wash floors) Severe difficulty   Carry a shopping bag or briefcase Moderate difficulty   Wash your back Severe difficulty   Use a knife to cut food No difficulty   Recreational activities in which you take some force or  impact through your arm, shoulder, or hand (golf, hammering, tennis) Moderate difficulty   During the past week, to what extent has your arm, shoulder or hand problem interfered with your normal social activities with family, friends, neighbors, or groups? Modererately   During the past week, to what extent has your arm, shoulder or hand problem limited your work or other regular daily activities Quite a bit   Arm, shoulder, or hand pain. None   Tingling (pins and needles) in your arm, shoulder, or hand None   Difficulty Sleeping No difficulty   DASH Score 36.36 %               [facility] Adult PT Treatment/Exercise - 10/24/16 0001      Manual Therapy   Edema Management donned pt's velcro compression sleeve and flat knit   Manual Lymphatic Drainage (MLD) In Supine: Short neck, superficial and 5 diaphragmatic breaths, Rt inguinal nodes  and Rt axillo-inguinal anastomosis, Lt axillary nodes and anterior inter-axillary anastomosis, and Rt UE from dorsal hand to lateral shoulder working proximal from distal then retracing all steps.                        [facility] Goals - 10/24/16 1107      CC Long Term Goal  #1   Title Patient with verbalize an understanding of lymphedema risk reduction precautions   Baseline attended ABC class on 09/22/2016   Status Achieved     CC Long Term Goal  #2   Title Patient will be independent in a home exercise program for range of motion and strength so that she can carry through at home    Baseline 10/24/16- pt has not been compliant with her home exercise program but was encouraged today to complete it   Time 4   Period Weeks   Status On-going     CC Long Term Goal  #3   Title Patient will report a decrease in pain by 50% so they can perform daily activities with greater ease   Baseline no change yet 09/04/16; 60% improvement on 09/12/16, Pt is a "whole lot better "  Pt says she is not having pain  09/25/2016   Status Achieved     CC Long Term Goal  #4   Title Patient will decrease the DASH score to <60   to demonstrate increased functional use of upper extremity   Baseline 84.09 on eval, 22.73 on 09/22/2016, 10/24/16- 36   Status Achieved     CC Long Term Goal  #5   Title Patient will improve right  shoulder abduction to 155 degrees so that she can achieve position needed for radiation therapy.   Baseline 114 degrees 09/04/16; 143 degrees on 09/12/16  155 on 09/16/2016   Status Achieved     CC Long Term Goal  #6   Title Patient will have a circumferential reduction of  2 cm at 10 cm above the right  ulnar styloid    Baseline 31 on 09/25/2016; 28 cm 10/01/16; 27.3 on 10/08/16; 28.5 cm 10/15/16   Status Achieved     CC Long Term Goal  #7   Title Pt will obtain appropriate compression garments such that she can manage her lymphedema independently   Time 1   Period  Weeks   Status New     Additional Goals   Additional Goals Yes            Plan -  2016/11/01 1146    Clinical Impression Statement Patient arrived with her compression velcro sleeve donned. She states it is getting easier for her husband to don. She still wants a sleeve to wear during that day that is not velcro. She is awaiting a call from DME supplier to schedule an appointment to be measured for a sleeve. She states her hand swelling has gone down and she has not had to wear her glove as much because her hands are the same size.    Rehab Potential Good   Clinical Impairments Affecting Rehab Potential previous total joint replacements in other joints with generalized weakness, obesity; caution of left total shoulder replacement  16 lymph nodes removed from right axilla    PT Frequency 3x / week   PT Duration 4 weeks   PT Treatment/Interventions ADLs/Self Care Home Management;Therapeutic activities;Therapeutic exercise;Taping;Manual lymph drainage;DME Instruction;Patient/family education;Passive range of motion;Compression bandaging;Scar mobilization;Manual techniques   PT Next Visit Plan Assess goals, recert at 2x/wk, re educated in strengthening/ROM exercises, ask pt if she went to medical supply store for sleeve   Consulted and Agree with Plan of Care Patient      Patient will benefit from skilled therapeutic intervention in order to improve the following deficits and impairments:  Obesity, Increased edema, Decreased knowledge of use of DME, Decreased knowledge of precautions, Decreased strength, Postural dysfunction, Impaired UE functional use  Visit Diagnosis: Lymphedema, not elsewhere classified       G-Codes - Nov 01, 2016 1148    Functional Assessment Tool Used Quick Dash   Functional Limitation Carrying, moving and handling objects   Carrying, Moving and Handling Objects Current Status [phone]) At least 20 percent but less than 40 percent impaired, limited or restricted    Carrying, Moving and Handling Objects Goal Status ([id number]) At least 1 percent but less than 20 percent impaired, limited or restricted      Problem List Patient Active Problem List   Diagnosis Date Noted  . Breast cancer of lower-inner quadrant of right female breast ([place]) 06/27/2016  . OSA on CPAP 06/26/2016  . Type 2 diabetes mellitus without complication ([place]) 41/93/7902  . Severe obesity (BMI >= 40) ([place]) 09/04/2014  . Essential hypertension 09/04/2014  . HLD (hyperlipidemia) 09/04/2014  . Gastroesophageal reflux disease without esophagitis 09/04/2014  . Asthma, mild persistent 09/04/2014  . Urge incontinence 09/04/2014  . Seasonal allergies 09/04/2014  . Generalized anxiety disorder 09/04/2014    Alexia Freestone 11/01/16, 11:49 AM  [place], [place], [address] Phone: [phone]   Fax:  [phone]  Name: LARIZA COTHRON MRN: [id number] Date of Birth: 10-22-1941  Allyson Sabal, PT 11-01-2016 11:49 AM

## 2016-10-24 NOTE — Telephone Encounter (Signed)
LMOVM for sister to return call.

## 2016-10-24 NOTE — Telephone Encounter (Signed)
Xanax last filled 09/26/2016--please advise

## 2016-10-27 NOTE — Telephone Encounter (Signed)
Medication phoned to pharmacy.  

## 2016-10-27 NOTE — Telephone Encounter (Signed)
LM for sister to return call. 

## 2016-10-28 ENCOUNTER — Other Ambulatory Visit: Payer: Self-pay | Admitting: Internal Medicine

## 2016-10-29 ENCOUNTER — Ambulatory Visit: Payer: Medicare PPO

## 2016-10-29 DIAGNOSIS — I890 Lymphedema, not elsewhere classified: Secondary | ICD-10-CM

## 2016-10-29 DIAGNOSIS — R222 Localized swelling, mass and lump, trunk: Secondary | ICD-10-CM

## 2016-10-29 NOTE — Therapy (Signed)
[place], [place], [address] Phone: [phone]   Fax:  [phone]  Physical Therapy Treatment  Patient Details  Name: Tiffany Arnold MRN: [id number] Date of Birth: 1941/10/14 Referring Provider: Dr. Dalbert Batman  Encounter Date: 10/29/2016      PT End of Session - 10/29/16 1229    Visit Number 21   Number of Visits 21   Date for PT Re-Evaluation 10/30/16   PT Start Time 0845   PT Stop Time 0935   PT Time Calculation (min) 50 min   Activity Tolerance Patient tolerated treatment well   Behavior During Therapy [facility] for tasks assessed/performed      Past Medical History:  Diagnosis Date  . Allergy   . Arthritis   . Asthma   . Cancer ([place]) 07/29/2016   right breast  . Cataract of both eyes   . Chicken pox   . Colon polyps   . Dog bite of index finger 07/23/2016   Left index finger  . GERD (gastroesophageal reflux disease)   . Hyperlipidemia   . Hypertension   . IBS (irritable bowel syndrome)   . Phlebitis   . Pneumonia    hx of several yrs ago  . Shortness of breath dyspnea    with exertion only  . Sleep apnea    wears CPAP machine nightly    Past Surgical History:  Procedure Laterality Date  . BACK SURGERY     lower  . BREAST CYST EXCISION Right 2001   negative  . BREAST LUMPECTOMY WITH NEEDLE LOCALIZATION AND AXILLARY LYMPH NODE DISSECTION Right 07/29/2016   Procedure: RIGHT BREAST LUMPECTOMY WITH DOUBLE NEEDLE LOCALIZATION AND COMPLETE RIGHT AXILLARY LYMPH NODE DISSECTION;  Surgeon: Fanny Skates, MD;  Location: [facility];  Service: General;  Laterality: Right;  . BREAST SURGERY Left 2000   Biopsy  . BUNIONECTOMY Bilateral 1998   great toe fusion on right foot  . COLONOSCOPY W/ POLYPECTOMY    . HERNIA REPAIR  1700   [place] SINUS SURGERY  2015  . PORTACATH PLACEMENT N/A 09/05/2016   Procedure: INSERTION PORT-A-CATH;  Surgeon: Fanny Skates, MD;  Location: [facility];   Service: General;  Laterality: N/A;  . REPLACEMENT TOTAL KNEE Bilateral 2007  . TOTAL SHOULDER REPLACEMENT Left 2007    There were no vitals filed for this visit.      Subjective Assessment - 10/29/16 0849    Subjective Got measured for my compression sleeve yesterday at [facility] and it should be in next week. My husband and I are doing much better with getting my garment on, I use a countertop edge in our kitchen to hold onto and my husband can get the sleeve on easier now.    Pertinent History Right breast lumpectomy with 16 lymph nodes removed on July 29, 2016. Has had bilateral knee replacements within the past 5 years. Had left shoulder replaced    Patient Stated Goals to get rid of the stinging in her arm (pt states this goal has been met 09/25/2016)    Currently in Pain? No/denies               LYMPHEDEMA/ONCOLOGY QUESTIONNAIRE - 10/29/16 0852      Right Upper Extremity Lymphedema   15 cm Proximal to Olecranon Process 41.1 cm   10 cm Proximal to Olecranon Process 40.3 cm   Olecranon Process 29.7 cm   15 cm Proximal to Ulnar Styloid Process 28.8 cm  10 cm Proximal to Ulnar Styloid Process 27.1 cm   Just Proximal to Ulnar Styloid Process 17.9 cm   Across Hand at [employer] 17.8 cm   At [place] of 2nd Digit 5.7 cm                  [facility] Adult PT Treatment/Exercise - 10/29/16 0001      Manual Therapy   Edema Management donned pt's velcro compression sleeve and flat knit glove, but also applied gauze with paper tape to sore at her inner upper arm that she wasn't aware of.    Manual Lymphatic Drainage (MLD) In Supine: Short neck, superficial and 5 diaphragmatic breaths, Rt inguinal nodes and Rt axillo-inguinal anastomosis, Lt axillary nodes and anterior inter-axillary anastomosis, and Rt UE from dorsal hand to lateral shoulder working proximal from distal then retracing all steps.                        [facility] Goals -  10/29/16 [place] Term Goal  #1   Title Patient with verbalize an understanding of lymphedema risk reduction precautions   Baseline attended ABC class on 09/22/2016   Status Achieved     [facility] Long Term Goal  #2   Title Patient will be independent in a home exercise program for range of motion and strength so that she can carry through at home    Baseline 10/24/16- pt has not been compliant with her home exercise program but was encouraged today to complete it; pt reports knowing how to perform these-10/29/16   Status Achieved     CC Long Term Goal  #3   Title Patient will report a decrease in pain by 50% so they can perform daily activities with greater ease   Baseline no change yet 09/04/16; 60% improvement on 09/12/16, Pt is a "whole lot better "  Pt says she is not having pain  09/25/2016   Status Achieved     CC Long Term Goal  #4   Title Patient will decrease the DASH score to <60   to demonstrate increased functional use of upper extremity   Baseline 84.09 on eval, 22.73 on 09/22/2016, 10/24/16- 36   Status Achieved     CC Long Term Goal  #5   Title Patient will improve right  shoulder abduction to 155 degrees so that she can achieve position needed for radiation therapy.   Baseline 114 degrees 09/04/16; 143 degrees on 09/12/16  155 on 09/16/2016   Status Achieved     CC Long Term Goal  #6   Title Patient will have a circumferential reduction of  2 cm at 10 cm above the right  ulnar styloid    Baseline 31 on 09/25/2016; 28 cm 10/01/16; 27.3 on 10/08/16; 28.5 cm 10/15/16; 27.1 cm 10/29/16   Status Achieved     CC Long Term Goal  #7   Title Pt will obtain appropriate compression garments such that she can manage her lymphedema independently   Baseline Pt was measured for a compression sleeve yesterday, already has a glove-10/29/16   Status Achieved            Plan - 10/29/16 1230    Clinical Impression Statement Pts circumference measurements were greatly reduced today from  last appt and she was pleased with this and had felt like her arm had reduced well. She reports was measured for a compression sleeve  yesterday and it should arrive next week. When donning pts Velcro garment at end of session noticed a sore at the inner upper portion of her arm where the top of the garment goes. Pt was not aware of this and it did not appear to be new. Placed gauze her with paper tape to help prevent furhter chafing and showed pt this in mirror to keep an eye on it and watch for infection. She is pleased with her current functional status and is ready to D/C as all goals have been met.    Rehab Potential Good   Clinical Impairments Affecting Rehab Potential previous total joint replacements in other joints with generalized weakness, obesity; caution of left total shoulder replacement  16 lymph nodes removed from right axilla    PT Frequency 3x / week   PT Duration 4 weeks   PT Treatment/Interventions ADLs/Self Care Home Management;Therapeutic activities;Therapeutic exercise;Taping;Manual lymph drainage;DME Instruction;Patient/family education;Passive range of motion;Compression bandaging;Scar mobilization;Manual techniques   PT Next Visit Plan D/C this visit.    Consulted and Agree with Plan of Care Patient      Patient will benefit from skilled therapeutic intervention in order to improve the following deficits and impairments:  Obesity, Increased edema, Decreased knowledge of use of DME, Decreased knowledge of precautions, Decreased strength, Postural dysfunction, Impaired UE functional use  Visit Diagnosis: Lymphedema, not elsewhere classified  Localized swelling, mass and lump, trunk     Problem List Patient Active Problem List   Diagnosis Date Noted  . Breast cancer of lower-inner quadrant of right female breast ([place]) 06/27/2016  . OSA on CPAP 06/26/2016  . Type 2 diabetes mellitus without complication ([place]) 55/97/4163  . Severe obesity (BMI >= 40) ([place]) 09/04/2014   . Essential hypertension 09/04/2014  . HLD (hyperlipidemia) 09/04/2014  . Gastroesophageal reflux disease without esophagitis 09/04/2014  . Asthma, mild persistent 09/04/2014  . Urge incontinence 09/04/2014  . Seasonal allergies 09/04/2014  . Generalized anxiety disorder 09/04/2014    Otelia Limes, PTA 10/29/2016, 12:34 PM  [place], [place], [address] Phone: [phone]   Fax:  [phone]  Name: Tiffany Arnold MRN: [id number] Date of Birth: November 01, 1941  PHYSICAL THERAPY DISCHARGE SUMMARY  Visits from Start of Care: 21  Current functional level related to goals / functional outcomes: All goals met as noted above.   Remaining deficits: Still with 35% impairment as self-reported on Quick DASH; pt. will benefit from home exercises and efforts to control swelling.   Education / Equipment: HEP, compression garments Plan: Patient agrees to discharge.  Patient goals were met. Patient is being discharged due to meeting the stated rehab goals.  ?????    Serafina Royals, PT 10/29/16 8:53 PM

## 2016-10-29 NOTE — Telephone Encounter (Signed)
I have been trying to get sister to call me back. Please advise if we can do a auto CPAP instead of titration study. Sister states pt can't get it done due to the cancer treatments. Please advise.

## 2016-10-30 NOTE — Telephone Encounter (Signed)
Can order an auto pap 5-20 test, will this be covered by insurance?

## 2016-10-31 ENCOUNTER — Ambulatory Visit: Payer: Medicare PPO | Admitting: Physical Therapy

## 2016-10-31 NOTE — Telephone Encounter (Signed)
Spoke with pt and sister and informed them we would place order for auto CPAP. Pt ask to call sister to setup CPAP. Order placed. Nothing further needed.

## 2016-10-31 NOTE — Addendum Note (Signed)
Addended by: Oscar La R on: 10/31/2016 03:06 PM   Modules accepted: Orders

## 2016-11-02 ENCOUNTER — Other Ambulatory Visit: Payer: Self-pay | Admitting: Hematology

## 2016-11-02 DIAGNOSIS — C50311 Malignant neoplasm of lower-inner quadrant of right female breast: Secondary | ICD-10-CM

## 2016-11-03 ENCOUNTER — Other Ambulatory Visit: Payer: Self-pay | Admitting: Internal Medicine

## 2016-11-07 DIAGNOSIS — G4733 Obstructive sleep apnea (adult) (pediatric): Secondary | ICD-10-CM | POA: Diagnosis not present

## 2016-11-11 ENCOUNTER — Ambulatory Visit: Payer: Medicare PPO

## 2016-11-12 ENCOUNTER — Encounter: Payer: Self-pay | Admitting: Hematology

## 2016-11-12 ENCOUNTER — Telehealth: Payer: Self-pay | Admitting: Hematology

## 2016-11-12 ENCOUNTER — Ambulatory Visit (HOSPITAL_BASED_OUTPATIENT_CLINIC_OR_DEPARTMENT_OTHER): Payer: Medicare PPO

## 2016-11-12 ENCOUNTER — Encounter: Payer: Self-pay | Admitting: Radiation Oncology

## 2016-11-12 ENCOUNTER — Ambulatory Visit: Payer: Medicare PPO | Admitting: Physical Therapy

## 2016-11-12 ENCOUNTER — Ambulatory Visit: Payer: Medicare PPO

## 2016-11-12 ENCOUNTER — Other Ambulatory Visit (HOSPITAL_BASED_OUTPATIENT_CLINIC_OR_DEPARTMENT_OTHER): Payer: Medicare PPO

## 2016-11-12 ENCOUNTER — Ambulatory Visit (HOSPITAL_BASED_OUTPATIENT_CLINIC_OR_DEPARTMENT_OTHER): Payer: Medicare PPO | Admitting: Hematology

## 2016-11-12 ENCOUNTER — Encounter: Payer: Self-pay | Admitting: *Deleted

## 2016-11-12 VITALS — BP 153/64 | HR 89 | Temp 98.0°F | Resp 17 | Ht 60.0 in | Wt 241.2 lb

## 2016-11-12 DIAGNOSIS — I10 Essential (primary) hypertension: Secondary | ICD-10-CM

## 2016-11-12 DIAGNOSIS — Z170 Estrogen receptor positive status [ER+]: Secondary | ICD-10-CM

## 2016-11-12 DIAGNOSIS — Z5111 Encounter for antineoplastic chemotherapy: Secondary | ICD-10-CM | POA: Diagnosis not present

## 2016-11-12 DIAGNOSIS — C773 Secondary and unspecified malignant neoplasm of axilla and upper limb lymph nodes: Secondary | ICD-10-CM

## 2016-11-12 DIAGNOSIS — Z9989 Dependence on other enabling machines and devices: Secondary | ICD-10-CM

## 2016-11-12 DIAGNOSIS — G4733 Obstructive sleep apnea (adult) (pediatric): Secondary | ICD-10-CM

## 2016-11-12 DIAGNOSIS — E1165 Type 2 diabetes mellitus with hyperglycemia: Secondary | ICD-10-CM

## 2016-11-12 DIAGNOSIS — C50311 Malignant neoplasm of lower-inner quadrant of right female breast: Secondary | ICD-10-CM | POA: Diagnosis not present

## 2016-11-12 DIAGNOSIS — Z95828 Presence of other vascular implants and grafts: Secondary | ICD-10-CM

## 2016-11-12 DIAGNOSIS — E119 Type 2 diabetes mellitus without complications: Secondary | ICD-10-CM

## 2016-11-12 LAB — COMPREHENSIVE METABOLIC PANEL
ALBUMIN: 3.2 g/dL — AB (ref 3.5–5.0)
ALK PHOS: 64 U/L (ref 40–150)
ALT: 17 U/L (ref 0–55)
ANION GAP: 9 meq/L (ref 3–11)
AST: 12 U/L (ref 5–34)
BILIRUBIN TOTAL: 0.3 mg/dL (ref 0.20–1.20)
BUN: 28.4 mg/dL — ABNORMAL HIGH (ref 7.0–26.0)
CALCIUM: 9.9 mg/dL (ref 8.4–10.4)
CO2: 26 mEq/L (ref 22–29)
Chloride: 104 mEq/L (ref 98–109)
Creatinine: 0.8 mg/dL (ref 0.6–1.1)
EGFR: 77 mL/min/{1.73_m2} — AB (ref >90)
Glucose: 185 mg/dl — ABNORMAL HIGH (ref 70–140)
POTASSIUM: 4 meq/L (ref 3.5–5.1)
SODIUM: 139 meq/L (ref 136–145)
TOTAL PROTEIN: 6.3 g/dL — AB (ref 6.4–8.3)

## 2016-11-12 LAB — CBC WITH DIFFERENTIAL/PLATELET
BASO%: 0 % (ref 0.0–2.0)
BASOS ABS: 0 10*3/uL (ref 0.0–0.1)
EOS ABS: 0 10*3/uL (ref 0.0–0.5)
EOS%: 0 % (ref 0.0–7.0)
HEMATOCRIT: 33.8 % — AB (ref 34.8–46.6)
HEMOGLOBIN: 11 g/dL — AB (ref 11.6–15.9)
LYMPH%: 11.4 % — ABNORMAL LOW (ref 14.0–49.7)
MCH: 29 pg (ref 25.1–34.0)
MCHC: 32.5 g/dL (ref 31.5–36.0)
MCV: 89.2 fL (ref 79.5–101.0)
MONO#: 0.1 10*3/uL (ref 0.1–0.9)
MONO%: 0.9 % (ref 0.0–14.0)
NEUT%: 87.7 % — ABNORMAL HIGH (ref 38.4–76.8)
NEUTROS ABS: 8.1 10*3/uL — AB (ref 1.5–6.5)
PLATELETS: 329 10*3/uL (ref 145–400)
RBC: 3.79 10*6/uL (ref 3.70–5.45)
RDW: 14.9 % — AB (ref 11.2–14.5)
WBC: 9.3 10*3/uL (ref 3.9–10.3)
lymph#: 1.1 10*3/uL (ref 0.9–3.3)

## 2016-11-12 MED ORDER — PALONOSETRON HCL INJECTION 0.25 MG/5ML
0.2500 mg | Freq: Once | INTRAVENOUS | Status: AC
  Administered 2016-11-12: 0.25 mg via INTRAVENOUS

## 2016-11-12 MED ORDER — DEXAMETHASONE SODIUM PHOSPHATE 10 MG/ML IJ SOLN
10.0000 mg | Freq: Once | INTRAMUSCULAR | Status: AC
  Administered 2016-11-12: 10 mg via INTRAVENOUS

## 2016-11-12 MED ORDER — HEPARIN SOD (PORK) LOCK FLUSH 100 UNIT/ML IV SOLN
500.0000 [IU] | Freq: Once | INTRAVENOUS | Status: AC | PRN
  Administered 2016-11-12: 500 [IU]
  Filled 2016-11-12: qty 5

## 2016-11-12 MED ORDER — DEXAMETHASONE SODIUM PHOSPHATE 10 MG/ML IJ SOLN
INTRAMUSCULAR | Status: AC
  Filled 2016-11-12: qty 1

## 2016-11-12 MED ORDER — SODIUM CHLORIDE 0.9 % IV SOLN
75.0000 mg/m2 | Freq: Once | INTRAVENOUS | Status: AC
  Administered 2016-11-12: 160 mg via INTRAVENOUS
  Filled 2016-11-12: qty 16

## 2016-11-12 MED ORDER — SODIUM CHLORIDE 0.9% FLUSH
10.0000 mL | INTRAVENOUS | Status: DC | PRN
  Administered 2016-11-12: 10 mL
  Filled 2016-11-12: qty 10

## 2016-11-12 MED ORDER — SODIUM CHLORIDE 0.9% FLUSH
10.0000 mL | INTRAVENOUS | Status: DC | PRN
  Administered 2016-11-12: 10 mL via INTRAVENOUS
  Filled 2016-11-12: qty 10

## 2016-11-12 MED ORDER — SODIUM CHLORIDE 0.9 % IV SOLN
Freq: Once | INTRAVENOUS | Status: AC
  Administered 2016-11-12: 12:00:00 via INTRAVENOUS

## 2016-11-12 MED ORDER — PALONOSETRON HCL INJECTION 0.25 MG/5ML
INTRAVENOUS | Status: AC
  Filled 2016-11-12: qty 5

## 2016-11-12 MED ORDER — SODIUM CHLORIDE 0.9 % IV SOLN
600.0000 mg/m2 | Freq: Once | INTRAVENOUS | Status: AC
  Administered 2016-11-12: 1300 mg via INTRAVENOUS
  Filled 2016-11-12: qty 65

## 2016-11-12 NOTE — Patient Instructions (Signed)
[facility] Discharge Instructions for Patients Receiving Chemotherapy  Today you received the following chemotherapy agents taxotere/cytoxan.   To help prevent nausea and vomiting after your treatment, we encourage you to take your nausea medication as directed.    If you develop nausea and vomiting that is not controlled by your nausea medication, call the clinic.   BELOW ARE SYMPTOMS THAT SHOULD BE REPORTED IMMEDIATELY:  *FEVER GREATER THAN 100.5 F  *CHILLS WITH OR WITHOUT FEVER  NAUSEA AND VOMITING THAT IS NOT CONTROLLED WITH YOUR NAUSEA MEDICATION  *UNUSUAL SHORTNESS OF BREATH  *UNUSUAL BRUISING OR BLEEDING  TENDERNESS IN MOUTH AND THROAT WITH OR WITHOUT PRESENCE OF ULCERS  *URINARY PROBLEMS  *BOWEL PROBLEMS  UNUSUAL RASH Items with * indicate a potential emergency and should be followed up as soon as possible.  Feel free to call the clinic you have any questions or concerns. The clinic phone number is [phone].  

## 2016-11-12 NOTE — Patient Instructions (Signed)

## 2016-11-12 NOTE — Telephone Encounter (Signed)
Appointments scheduled per 11/22 LOS. Patient given AVS report and calendars with future scheduled appointments. °

## 2016-11-12 NOTE — Progress Notes (Signed)
[place]  [phone] [phone]  Clinic Follow Up Note   Patient Care Team: [name], NP as PCP - General (Internal Medicine) 11/12/2016   CHIEF COMPLAINTS:  Follow Up right breast cancer  . Oncology History   Breast cancer of lower-inner quadrant of right female breast [facility])   Staging form: Breast, AJCC 7th Edition   - Clinical stage from 06/09/2016: Stage IIB (T2, N1, M0) - Signed by Truitt Merle, MD on 06/27/2016   - Pathologic stage from 07/29/2016: Stage IIA (T1c, N1a, cM0) - Signed by Truitt Merle, MD on 08/13/2016       Breast cancer of lower-inner quadrant of right female breast ([place])   05/23/2016 Mammogram    Diagnostic mammogram and ultrasound showed suspicious architectural distortion within the right breast lower inner quadrant, measuring 1.3 cm, without sonographic corelate.      06/03/2016 Initial Biopsy    Right breast inferior lower quadrant core needle biopsy showed atypical ductal hyperplasia with calcifications      06/09/2016 Receptors her2    Breast biopsy showed ER 100% positive, PR 100% positive, HER-2 negative, Ki-67 40%      06/09/2016 Initial Diagnosis    Breast cancer of lower-inner quadrant of right female breast ([place])      06/09/2016 Initial Biopsy    Right breast inner quadrant core needle biopsy showed invasive ductal carcinoma and DCIS, grade 1-2      06/17/2016 Initial Biopsy    Right axillary lymph node core needle biopsy showed metastatic carcinoma      06/17/2016 Receptors her2    Axillary node biopsy showed ER 100% positive, PR 95% positive, HER-2 negative      06/19/2016 Imaging    Bilateral breast MRI showed locations in the lower inner right breast, largest 2.7X1.3X1.6cm, biopsy clips in 2 of this areas. There are abnormal right axillary lymph nodes showing second cortical's, no evidence of malignancy in the left breast.      07/29/2016 Surgery    Right lumpectomy and ALND      07/29/2016  Pathology Results    Right lumpectomy showed G3 IDC, DCIS, margins (-), LVI(-).  1 of 16 nodes was positive       07/29/2016 Miscellaneous    Mammaprint showed high risk disease, luminal type B      09/09/2016 -  Adjuvant Chemotherapy    Docetaxel and Cytoxan (TC) every 3 weeks       HISTORY OF PRESENTING ILLNESS:  Tiffany Arnold 75 y.o. female is here because of her recently diagnosed left breast cancer. She presents to my clinic with her friend, who is a Marine scientist.   Her cancer was discovered by screening mammogram. She had a right breast cyst in 2001, which was removed. She has been doing mammogram once a year. The mammogram and ultrasound on 05/23/2016 showed a suspicious architectural this portion, 1.3 cm, without sonographic correlation. She underwent core needle biopsy of the right breast mass twice and right axilla node biopsy, one breast biopsy and node biopsy showed invasive ductal carcinoma and DCIS, ER/PR strong positive, HER-2 negative.  She denies any other new symptoms. She has noticed mild fatigued lately, she still works full time in a vet's office, she has IBS, has intermittent constipation and diarrhea. She has arthritis, and both knee replacement and shoulder surgery before, she also has some back pain lately, she takes tylenol occasionally.  She lives with her husband, moderately active. No family history of breast cancer  GYN HISTORY  Menarchal: 13 LMP: 36 Contraceptive: 4-5 years HRT: 3 years  G2P2: no breast feeding, daughter 15 yo and son is 87 yo.   CURRENT THERAPY: Adjuvant chemotherapy with docetaxel and Cytoxan (TC) every 3 weeks, starting on 09/09/2016   INTERIM HISTORY: Tiffany Arnold returns for follow up and last cycle 4 chemo. She is doing well overall. She had mild diarrhea for 4 days, mild nausea and fatigue, recovered very well last week. She lost some weight, no other complains. No fever or infection.   MEDICAL HISTORY:  Past Medical History:    Diagnosis Date  . Allergy   . Arthritis   . Asthma   . Cancer ([place]) 07/29/2016   right breast  . Cataract of both eyes   . Chicken pox   . Colon polyps   . Dog bite of index finger 07/23/2016   Left index finger  . GERD (gastroesophageal reflux disease)   . Hyperlipidemia   . Hypertension   . IBS (irritable bowel syndrome)   . Phlebitis   . Pneumonia    hx of several yrs ago  . Shortness of breath dyspnea    with exertion only  . Sleep apnea    wears CPAP machine nightly    SURGICAL HISTORY: Past Surgical History:  Procedure Laterality Date  . BACK SURGERY     lower  . BREAST CYST EXCISION Right 2001   negative  . BREAST LUMPECTOMY WITH NEEDLE LOCALIZATION AND AXILLARY LYMPH NODE DISSECTION Right 07/29/2016   Procedure: RIGHT BREAST LUMPECTOMY WITH DOUBLE NEEDLE LOCALIZATION AND COMPLETE RIGHT AXILLARY LYMPH NODE DISSECTION;  Surgeon: Fanny Skates, MD;  Location: [place];  Service: General;  Laterality: Right;  . BREAST SURGERY Left 2000   Biopsy  . BUNIONECTOMY Bilateral 1998   great toe fusion on right foot  . COLONOSCOPY W/ POLYPECTOMY    . HERNIA REPAIR  [id number]   [place] SINUS SURGERY  2015  . PORTACATH PLACEMENT N/A 09/05/2016   Procedure: INSERTION PORT-A-CATH;  Surgeon: Fanny Skates, MD;  Location: [facility];  Service: General;  Laterality: N/A;  . REPLACEMENT TOTAL KNEE Bilateral 2007  . TOTAL SHOULDER REPLACEMENT Left 2007    SOCIAL HISTORY: Social History   Social History  . Marital status: Married    Spouse name: N/A  . Number of children: N/A  . Years of education: N/A   Occupational History  . Not on file.   Social History Main Topics  . Smoking status: Former Smoker    Packs/day: 2.00    Years: 25.00    Quit date: 12/22/1990  . Smokeless tobacco: Never Used     Comment: quit 24 years ago  . Alcohol use Yes     Comment: rare  . Drug use: No  . Sexual activity: Not Currently   Other Topics Concern  . Not on file   Social  History Narrative  . No narrative on file    FAMILY HISTORY: Family History  Problem Relation Age of Onset  . Arthritis Mother   . Stroke Mother   . Hypertension Mother   . Cancer Father     Prostate  . Stroke Father   . Hypertension Father   . Hypertension Maternal Grandmother   . Rheum arthritis Maternal Grandfather   . Stroke Maternal Grandfather   . Hypertension Maternal Grandfather   . Cancer Paternal Grandmother     Colon  . Hypertension Paternal Grandmother   . Hypertension Paternal Grandfather   .  Breast cancer Neg Hx     ALLERGIES:  is allergic to other.  MEDICATIONS:  Current Outpatient Prescriptions  Medication Sig Dispense Refill  . albuterol (PROAIR HFA) 108 (90 BASE) MCG/ACT inhaler Inhale 2 puffs into the lungs every 6 (six) hours as needed for wheezing.     . albuterol-ipratropium (COMBIVENT) 18-103 MCG/ACT inhaler Inhale 2 puffs into the lungs every 4 (four) hours as needed for wheezing or shortness of breath.     . ALPRAZolam (XANAX) 0.25 MG tablet TAKE ONE TABLET BY MOUTH TWICE DAILY AS NEEDED 60 tablet 0  . Calcium Carbonate-Vitamin D (CALCIUM-VITAMIN D) 500-200 MG-UNIT tablet Take 1 tablet by mouth daily.    . cetirizine (ZYRTEC) 10 MG tablet Take 10 mg by mouth daily.    . dexamethasone (DECADRON) 4 MG tablet TAKE ONE TABLET BY MOUTH TWICE DAILY *START  THE  DAY  BEFORE  TAXOTERE,  THEN  AGAIN  THE  DAY  AFTER  CHEMO  FOR  3  DAYS* 30 tablet 1  . EPINEPHrine 0.3 mg/0.3 mL IJ SOAJ injection Inject 0.3 mg into the muscle once as needed (ALLERGIC REACTION).     . glucosamine-chondroitin 500-400 MG tablet Take 1 tablet by mouth 2 (two) times daily.    . HYDROcodone-acetaminophen (NORCO/VICODIN) 5-325 MG tablet Take 1-2 tablets by mouth every 4 (four) hours as needed. 50 tablet 0  . lidocaine-prilocaine (EMLA) cream Apply to port at least 1 hour prior to chemo 30 g 1  . magic mouthwash SOLN Swish and Spit 5-10 mLs 4 times a day as needed. 240 mL 1  .  metFORMIN (GLUCOPHAGE) 1000 MG tablet Take 1 tablet (1,000 mg total) by mouth 2 (two) times daily with a meal. 180 tablet 0  . methocarbamol (ROBAXIN) 500 MG tablet Take 1 tablet (500 mg total) by mouth at bedtime as needed for muscle spasms. 10 tablet 0  . Olmesartan-Amlodipine-HCTZ 40-5-25 MG TABS TAKE ONE TABLET BY MOUTH ONCE DAILY 90 tablet 1  . Omega-3 Fatty Acids (FISH OIL) 1000 MG CAPS Take 1,000 mg by mouth daily.    . ondansetron (ZOFRAN) 8 MG tablet TAKE ONE TABLET BY MOUTH TWICE DAILY AS NEEDED FOR  REFRACTORY  NAUSEA/VOMITING.  START  ON  DAY  3  AFTER  CHEMO. 30 tablet 1  . oxybutynin (DITROPAN-XL) 10 MG 24 hr tablet TAKE ONE TABLET BY MOUTH AT BEDTIME 90 tablet 1  . pravastatin (PRAVACHOL) 40 MG tablet Take 1 tablet (40 mg total) by mouth daily. PLEASE SCHEDULE ANNUAL EXAM 90 tablet 0  . prochlorperazine (COMPAZINE) 10 MG tablet TAKE ONE TABLET BY MOUTH EVERY 6 HOURS AS NEEDED FOR NAUSEA AND VOMITING 30 tablet 1  . ranitidine (ZANTAC) 150 MG tablet Take 150 mg by mouth every evening.     . vitamin B-12 (CYANOCOBALAMIN) 1000 MCG tablet Take 1,000 mcg by mouth daily.     No current facility-administered medications for this visit.    Facility-Administered Medications Ordered in Other Visits  Medication Dose Route Frequency Provider Last Rate Last Dose  . sodium chloride flush (NS) 0.9 % injection 10 mL  10 mL Intravenous PRN Jerek Meulemans, MD   10 mL at 11/12/16 1015    REVIEW OF SYSTEMS:   Constitutional: Denies fevers, chills or abnormal night sweats Eyes: Denies blurriness of vision, double vision or watery eyes Ears, nose, mouth, throat, and face: Denies mucositis or sore throat Respiratory: Denies cough, dyspnea or wheezes Cardiovascular: Denies palpitation, chest discomfort or lower extremity swelling   Gastrointestinal:  Denies nausea, heartburn or change in bowel habits Skin: Denies abnormal skin rashes Lymphatics: Denies new lymphadenopathy or easy  bruising Neurological:Denies numbness, tingling or new weaknesses Behavioral/Psych: Mood is stable, no new changes  All other systems were reviewed with the patient and are negative.  PHYSICAL EXAMINATION: ECOG PERFORMANCE STATUS: 1  Vitals:   11/12/16 1109  BP: (!) 153/64  Pulse: 89  Resp: 17  Temp: 98 F (36.7 C)   Filed Weights   11/12/16 1109  Weight: 241 lb 3.2 oz (109.4 kg)    GENERAL:alert, no distress and comfortable SKIN: skin color, texture, turgor are normal, no rashes or significant lesions EYES: normal, conjunctiva are pink and non-injected, sclera clear OROPHARYNX:no exudate, no erythema and lips, buccal mucosa, and tongue normal  NECK: supple, thyroid normal size, non-tender, without nodularity LYMPH:  no palpable lymphadenopathy in the cervical, axillary or inguinal LUNGS: clear to auscultation and percussion with normal breathing effort HEART: regular rate & rhythm and no murmurs and no lower extremity edema ABDOMEN:abdomen soft, non-tender and normal bowel sounds Musculoskeletal:no cyanosis of digits and no clubbing  PSYCH: alert & oriented x 3 with fluent speech NEURO: no focal motor/sensory deficits Breasts: Breast inspection showed them to be symmetrical with no nipple discharge. (+) incision sites in right breast and axilla are healing well. Palpation of the breasts and axilla revealed no obvious mass that I could appreciate.   LABORATORY DATA:  I have reviewed the data as listed CBC Latest Ref Rng & Units 11/12/2016 10/21/2016 09/30/2016  WBC 3.9 - 10.3 10e3/uL 9.3 10.0 10.0  Hemoglobin 11.6 - 15.9 g/dL 11.0(L) 11.2(L) 12.5  Hematocrit 34.8 - 46.6 % 33.8(L) 34.3(L) 37.9  Platelets 145 - 400 10e3/uL 329 305 513(H)   CMP Latest Ref Rng & Units 11/12/2016 10/21/2016 09/30/2016  Glucose 70 - 140 mg/dl 185(H) 207(H) 193(H)  BUN 7.0 - 26.0 mg/dL 28.4(H) 31.8(H) 22.4  Creatinine 0.6 - 1.1 mg/dL 0.8 0.8 0.8  Sodium 136 - 145 mEq/L 139 138 138  Potassium  3.5 - 5.1 mEq/L 4.0 4.1 4.0  Chloride 101 - 111 mmol/L - - -  CO2 22 - 29 mEq/L _0 Calcium 8.4 - 10.4 mg/dL 9.9 10.1 10.1  Total Protein 6.4 - 8.3 g/dL 6.3(L) 6.5 6.8  Total Bilirubin 0.20 - 1.20 mg/dL 0.30 0.26 0.35  Alkaline Phos 40 - 150 U/L 64 58 71  AST 5 - 34 U/L _1 ALT 0 - 55 U/L _2 PATHOLOGY REPORT  Diagnosis 06/03/2016 Breast, right, needle core biopsy, ILQ focal 1.3 cm asymmetry/distortion - ATYPICAL DUCTAL HYPERPLASIA WITH CALCIFICATIONS. - FIBROCYSTIC CHANGES WITH CALCIFICATIONS. - SEE COMMENT. Microscopic Comment The results were called to [facility] on 06/04/16. (JBK:ds 06/04/16)   Diagnosis 06/09/2016 Breast, right, needle core biopsy, inner - INVASIVE DUCTAL CARCINOMA. - DUCTAL CARCINOMA IN SITU. - SEE COMMENT. Microscopic Comment The carcinoma appears grade 1-2. A breast prognostic profile will be performed and the results reported separately. The results were called to [facility] on 06/10/2016. (JBK:ecj 06/10/2016) Results: HER2 - NEGATIVE RATIO OF HER2/CEP17 SIGNALS 1.55 AVERAGE HER2 COPY NUMBER PER CELL 2.25  Results: IMMUNOHISTOCHEMICAL AND MORPHOMETRIC ANALYSIS PERFORMED MANUALLY Estrogen Receptor: 100%, POSITIVE, STRONG STAINING INTENSITY Progesterone Receptor: 100%, POSITIVE, STRONG STAINING INTENSITY Proliferation Marker Ki67: 40%   Diagnosis 06/17/2016 Lymph node, needle/core biopsy, right, inferior, axilla to far lateral breast - METASTATIC CARCINOMA, SEE COMMENT. Microscopic Comment The morphology  is consistent with the patient breast carcinoma. Prognostic markers will be ordered and reported in an addendum. The case was called to [facility] on 06/18/2016. Results: IMMUNOHISTOCHEMICAL AND MORPHOMETRIC ANALYSIS PERFORMED MANUALLY Estrogen Receptor: 100%, POSITIVE, STRONG STAINING INTENSITY Progesterone Receptor: 95%, POSITIVE, STRONG STAINING  INTENSITY  Results: HER2 - NEGATIVE RATIO OF HER2/CEP17 SIGNALS 1.25 AVERAGE HER2 COPY NUMBER PER CELL 2.00  Diagnosis 07/29/2016 1. Breast, lumpectomy, Right INVASIVE DUCTAL CARCINOMA, GRADE 3, SPANNING 1.5 CM DUCTAL CARCINOMA IN SITU IS PRESENT ALL MARGINS OF RESECTION ARE NEGATIVE FOR CARCINOMA 2. Lymph nodes, regional resection, Right axillary contents METASTATIC BREAST DUCTAL CARCINOMA IN ONE OF SIXTEEN LYMPH NODES (1/16) Specimen, including laterality and lymph node sampling (sentinel, non-sentinel): Right partial breast and regional lymph nodes Procedure: Lumpectomy Histologic type: Ductal carcinoma Grade: 3 Tubule formation: 3 Nuclear pleomorphism: 2 Mitotic:3 Tumor size (gross measurement or glass slide measurement): 1.5 cm Margins: Invasive, distance to closest margin: 0.7 cm In-situ, distance to closest margin: 0.7 cm If margin positive, focally or broadly: NA Lymphovascular invasion: Not identified Ductal carcinoma in situ: Present Grade: 3 Extensive intraductal component: moderate Lobular neoplasia: Negative Tumor focality: Focal Treatment effect: Negative If present, treatment effect in breast tissue, lymph nodes or both: NA Extent of tumor: Skin: Negative Nipple: Negative Skeletal muscle: Negative Lymph nodes: Examined: 0 Sentinel 16 Non-sentinel 16 Total Lymph nodes with metastasis: 1 Isolated tumor cells (< 0.2 mm): 0 Micrometastasis: (> 0.2 mm and < 2.0 mm): 0 Macrometastasis: (> 2.0 mm): 1 Extracapsular extension: Present Breast prognostic profile: Estrogen receptor: 100% Progesterone receptor: 100% Her 2 neu: Negative Ki-67: 40% Non-neoplastic breast: Unremarkable TNM: pT1c, pN1  RADIOGRAPHIC STUDIES: I have personally reviewed the radiological images as listed and agreed with the findings in the report. No results found. Diagnostic mammogram and ultrasound of right breast including right axillary 05/23/2016 IMPRESSION: Suspicious  architectural distortion within the lower inner quadrant of the right breast, measuring 1.3 cm greatest dimension, without sonographic correlate. This distortion could conceivably be related to the patient's earlier surgical excision biopsy perform in 2001 (patient states that this earlier surgical excision was in this same region of her right breast), however, exclusion of a neoplastic cause is needed.  As such, stereotactic-guided biopsy, with 3D tomosynthesis, is recommended for this suspicious finding.   ASSESSMENT & PLAN: 74-year-old Caucasian female, with mammogram discovered right breast cancer.  1. 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  -We reviewed her mammaprint genomic test result, which showed luminal type B, high risk, average 10-year risk of recurrence without adjuvant therapy is 29% -given the high risk disease, I strongly recommend her to consider adjuvant chemo. She is relatively elderly, with some medical comorbilities, but still has good PS, so I recommend adjuvant docetaxel and cytoxan every 3 weeks for 4 cycles   -the goal of chemo is curative  -She has been tolerating chemotherapy well, lab reviewed, adequate for treatment, we'll proceed cycle4 (last cycle) today. -I will refer her to rad/onc for adjuvant breast radiation  --Given the strong ER and PR positivity, I do recommend adjuvant aromatase inhibitor to reduce her risk of cancer recurrence,  The potential benefit and side effects, which includes but not limited to, hot flash, skin and vaginal   dryness, metabolic changes ( increased blood glucose, cholesterol, weight, etc.), slightly in increased risk of cardiovascular disease, cataracts,  muscular and joint discomfort, osteopenia and osteoporosis, etc, were discussed with her in great details. She is interested, and we'll start after she completes radiation. She does have moderate arthralgia, if she has worsening arthralgia was at aromatase inhibitor, I'll switch her to tamoxifen. -We discussed her breast cancer surveillance. She will continue continue screening mammogram, self exam, and routine follow-up with [place] for labs and exam. -I encouraged her to have healthy diet and exercise regularly, and try to lose some weight after she completes radiation  2. Hyperglycemia -She had borderline hyperglycemia before. Her random glucose has been 180-200, this is likely steroids induced hyperglycemia. -I strongly encouraged her follow-up with her primary care physician, she agrees to call as soon as possible. I think she probably need metformin to control her blood glucose. -She is on dexamethasone 8 mg daily for 4 days around her chemo  3. Right upper extremity lymphedema -She is quite significant right upper extremity lymphedema after the axillary node dissection -Continue physical therapy, and wear sleeves   4. Hypertension and arthritis -She will continue medication and follow-up with her primary care physician -We discussed her that chemotherapy may affect her oral intake and her blood pressure, she is on 3 blood pressure medications including HCTZ, I strongly encouraged her to monitor her blood pressure at home, and we may need to adjust her medication if her blood pressure drops.  5. Morbid obesity -I encouraged her to have healthy diet and exercise regularly, try to lose some weight.  Plan -last cycle TC today -Return to clinic in 6 weeks for lab and follow up -rad/onc follow up  -Continue physical therapy -She'll call her primary care physician to discuss her diabetic management  All questions were answered. The patient knows to call the clinic with any problems, questions  or concerns. I spent 20 minutes counseling the patient face to face. The total time spent in the appointment was 25 minutes and more than 50% was on counseling.     Truitt Merle, MD 11/12/2016

## 2016-11-13 NOTE — Addendum Note (Signed)
Addended by: Truitt Merle on: 11/13/2016 09:52 AM   Modules accepted: Orders

## 2016-11-14 ENCOUNTER — Ambulatory Visit (HOSPITAL_BASED_OUTPATIENT_CLINIC_OR_DEPARTMENT_OTHER): Payer: Medicare PPO

## 2016-11-14 VITALS — BP 169/73 | HR 79 | Temp 98.0°F | Resp 18

## 2016-11-14 DIAGNOSIS — Z5189 Encounter for other specified aftercare: Secondary | ICD-10-CM

## 2016-11-14 DIAGNOSIS — C50311 Malignant neoplasm of lower-inner quadrant of right female breast: Secondary | ICD-10-CM

## 2016-11-14 DIAGNOSIS — Z170 Estrogen receptor positive status [ER+]: Principal | ICD-10-CM

## 2016-11-14 MED ORDER — PEGFILGRASTIM INJECTION 6 MG/0.6ML ~~LOC~~
6.0000 mg | PREFILLED_SYRINGE | Freq: Once | SUBCUTANEOUS | Status: AC
  Administered 2016-11-14: 6 mg via SUBCUTANEOUS
  Filled 2016-11-14: qty 0.6

## 2016-11-14 NOTE — Patient Instructions (Signed)
Pegfilgrastim injection What is this medicine? PEGFILGRASTIM (PEG fil gra stim) is a long-acting granulocyte colony-stimulating factor that stimulates the growth of neutrophils, a type of white blood cell important in the body's fight against infection. It is used to reduce the incidence of fever and infection in patients with certain types of cancer who are receiving chemotherapy that affects the bone marrow, and to increase survival after being exposed to high doses of radiation. This medicine may be used for other purposes; ask your health care provider or pharmacist if you have questions. What should I tell my health care provider before I take this medicine? They need to know if you have any of these conditions: -kidney disease -latex allergy -ongoing radiation therapy -sickle cell disease -skin reactions to acrylic adhesives (On-Body Injector only) -an unusual or allergic reaction to pegfilgrastim, filgrastim, other medicines, foods, dyes, or preservatives -pregnant or trying to get pregnant -breast-feeding How should I use this medicine? This medicine is for injection under the skin. If you get this medicine at home, you will be taught how to prepare and give the pre-filled syringe or how to use the On-body Injector. Refer to the patient Instructions for Use for detailed instructions. Use exactly as directed. Take your medicine at regular intervals. Do not take your medicine more often than directed. It is important that you put your used needles and syringes in a special sharps container. Do not put them in a trash can. If you do not have a sharps container, call your pharmacist or healthcare provider to get one. Talk to your pediatrician regarding the use of this medicine in children. While this drug may be prescribed for selected conditions, precautions do apply. Overdosage: If you think you have taken too much of this medicine contact a poison control center or emergency room at  once. NOTE: This medicine is only for you. Do not share this medicine with others. What if I miss a dose? It is important not to miss your dose. Call your doctor or health care professional if you miss your dose. If you miss a dose due to an On-body Injector failure or leakage, a new dose should be administered as soon as possible using a single prefilled syringe for manual use. What may interact with this medicine? Interactions have not been studied. Give your health care provider a list of all the medicines, herbs, non-prescription drugs, or dietary supplements you use. Also tell them if you smoke, drink alcohol, or use illegal drugs. Some items may interact with your medicine. This list may not describe all possible interactions. Give your health care provider a list of all the medicines, herbs, non-prescription drugs, or dietary supplements you use. Also tell them if you smoke, drink alcohol, or use illegal drugs. Some items may interact with your medicine. What should I watch for while using this medicine? You may need blood work done while you are taking this medicine. If you are going to need a MRI, CT scan, or other procedure, tell your doctor that you are using this medicine (On-Body Injector only). What side effects may I notice from receiving this medicine? Side effects that you should report to your doctor or health care professional as soon as possible: -allergic reactions like skin rash, itching or hives, swelling of the face, lips, or tongue -dizziness -fever -pain, redness, or irritation at site where injected -pinpoint red spots on the skin -red or dark-brown urine -shortness of breath or breathing problems -stomach or side pain, or pain   at the shoulder -swelling -tiredness -trouble passing urine or change in the amount of urine Side effects that usually do not require medical attention (report to your doctor or health care professional if they continue or are  bothersome): -bone pain -muscle pain This list may not describe all possible side effects. Call your doctor for medical advice about side effects. You may report side effects to FDA at 1-800-FDA-1088. Where should I keep my medicine? Keep out of the reach of children. Store pre-filled syringes in a refrigerator between 2 and 8 degrees C (36 and 46 degrees F). Do not freeze. Keep in carton to protect from light. Throw away this medicine if it is left out of the refrigerator for more than 48 hours. Throw away any unused medicine after the expiration date. NOTE: This sheet is a summary. It may not cover all possible information. If you have questions about this medicine, talk to your doctor, pharmacist, or health care provider.    2016, [employer]vier/Gold Standard. (2014-12-28 14:30:14)  

## 2016-11-23 ENCOUNTER — Other Ambulatory Visit: Payer: Self-pay | Admitting: Internal Medicine

## 2016-11-24 ENCOUNTER — Ambulatory Visit: Payer: Medicare PPO

## 2016-11-24 ENCOUNTER — Other Ambulatory Visit: Payer: Self-pay | Admitting: *Deleted

## 2016-11-24 ENCOUNTER — Encounter: Payer: Medicare PPO | Admitting: Nurse Practitioner

## 2016-11-24 DIAGNOSIS — C50311 Malignant neoplasm of lower-inner quadrant of right female breast: Secondary | ICD-10-CM

## 2016-11-24 NOTE — Telephone Encounter (Signed)
Last filled 10/27/16--please advise

## 2016-11-24 NOTE — Telephone Encounter (Signed)
Ok to phone in Xanax to be filled on or after 12/6  * please change class to phone in

## 2016-11-24 NOTE — Progress Notes (Signed)
Pt sister called stated pt having "bad diarrhea since Friday". Called pt and discussed symptoms. Pt with diarrhea since Friday and not eating and drinking "a little green tea", taking imodium as instructed without relief. Pt instructed to go to ER if she felt she needed to be seen earlier or will come to [facility] in am. Pt relate she will come to [facility] in am of 12/5. Denies further needs.

## 2016-11-25 ENCOUNTER — Ambulatory Visit (HOSPITAL_BASED_OUTPATIENT_CLINIC_OR_DEPARTMENT_OTHER): Payer: Medicare PPO

## 2016-11-25 ENCOUNTER — Ambulatory Visit (HOSPITAL_BASED_OUTPATIENT_CLINIC_OR_DEPARTMENT_OTHER): Payer: Medicare PPO | Admitting: Nurse Practitioner

## 2016-11-25 ENCOUNTER — Encounter: Payer: Self-pay | Admitting: Nurse Practitioner

## 2016-11-25 VITALS — BP 117/50 | HR 66 | Temp 97.7°F | Resp 17 | Ht 60.0 in | Wt 234.3 lb

## 2016-11-25 DIAGNOSIS — C50311 Malignant neoplasm of lower-inner quadrant of right female breast: Secondary | ICD-10-CM

## 2016-11-25 DIAGNOSIS — K589 Irritable bowel syndrome without diarrhea: Secondary | ICD-10-CM | POA: Insufficient documentation

## 2016-11-25 DIAGNOSIS — R197 Diarrhea, unspecified: Secondary | ICD-10-CM | POA: Diagnosis not present

## 2016-11-25 LAB — MAGNESIUM: MAGNESIUM: 1.4 mg/dL — AB (ref 1.5–2.5)

## 2016-11-25 LAB — CBC WITH DIFFERENTIAL/PLATELET
BASO%: 0.5 % (ref 0.0–2.0)
BASOS ABS: 0.1 10*3/uL (ref 0.0–0.1)
EOS%: 0.1 % (ref 0.0–7.0)
Eosinophils Absolute: 0 10*3/uL (ref 0.0–0.5)
HEMATOCRIT: 36.2 % (ref 34.8–46.6)
HEMOGLOBIN: 11.4 g/dL — AB (ref 11.6–15.9)
LYMPH#: 2.9 10*3/uL (ref 0.9–3.3)
LYMPH%: 13.3 % — ABNORMAL LOW (ref 14.0–49.7)
MCH: 28.1 pg (ref 25.1–34.0)
MCHC: 31.6 g/dL (ref 31.5–36.0)
MCV: 89.1 fL (ref 79.5–101.0)
MONO#: 1.1 10*3/uL — AB (ref 0.1–0.9)
MONO%: 5.1 % (ref 0.0–14.0)
NEUT#: 17.7 10*3/uL — ABNORMAL HIGH (ref 1.5–6.5)
NEUT%: 81 % — AB (ref 38.4–76.8)
PLATELETS: 262 10*3/uL (ref 145–400)
RBC: 4.06 10*6/uL (ref 3.70–5.45)
RDW: 15.4 % — ABNORMAL HIGH (ref 11.2–14.5)
WBC: 21.8 10*3/uL — ABNORMAL HIGH (ref 3.9–10.3)

## 2016-11-25 LAB — COMPREHENSIVE METABOLIC PANEL
ALBUMIN: 3 g/dL — AB (ref 3.5–5.0)
ALK PHOS: 104 U/L (ref 40–150)
ALT: 18 U/L (ref 0–55)
ANION GAP: 12 meq/L — AB (ref 3–11)
AST: 12 U/L (ref 5–34)
BUN: 27.9 mg/dL — ABNORMAL HIGH (ref 7.0–26.0)
CALCIUM: 9.8 mg/dL (ref 8.4–10.4)
CHLORIDE: 102 meq/L (ref 98–109)
CO2: 26 mEq/L (ref 22–29)
CREATININE: 0.9 mg/dL (ref 0.6–1.1)
EGFR: 63 mL/min/{1.73_m2} — ABNORMAL LOW (ref >90)
Glucose: 119 mg/dl (ref 70–140)
Potassium: 4.1 mEq/L (ref 3.5–5.1)
Sodium: 140 mEq/L (ref 136–145)
Total Bilirubin: 0.31 mg/dL (ref 0.20–1.20)
Total Protein: 5.8 g/dL — ABNORMAL LOW (ref 6.4–8.3)

## 2016-11-25 MED ORDER — DIPHENOXYLATE-ATROPINE 2.5-0.025 MG PO TABS: 2.0000 | ORAL_TABLET | Freq: Four times a day (QID) | ORAL | 0 refills | Status: DC | PRN

## 2016-11-25 NOTE — Assessment & Plan Note (Signed)
Patient received cycle 4 of her TC chemotherapy on 11/12/2016; and then received Neulasta for growth factor support.  She is scheduled for a follow-up visit with radiation oncology on 12/02/2016.  She is scheduled for labs and a follow-up visit with Dr. Burr Medico on 12/25/2015.

## 2016-11-25 NOTE — Assessment & Plan Note (Signed)
Patient states she's had a good deal of diarrhea for the past few days; but it has since resolved.  Thus diarrhea episode was last night.  Magnesium is down to 1.4.  However, patient is able to both eat and drink today; and she was advised to take over-the-counter magnesium supplements as directed.

## 2016-11-25 NOTE — Progress Notes (Signed)
SYMPTOM MANAGEMENT CLINIC    Chief Complaint: Diarrhea  HPI:  Tiffany Arnold 75 y.o. female diagnosed with breast cancer.  Just completed TC chemotherapy regimen.  Is scheduled to initiate radiation treatments within the next few weeks.   Oncology History   Breast cancer of lower-inner quadrant of right female breast [facility])   Staging form: Breast, AJCC 7th Edition   - Clinical stage from 06/09/2016: Stage IIB (T2, N1, M0) - Signed by Truitt Merle, MD on 06/27/2016   - Pathologic stage from 07/29/2016: Stage IIA (T1c, N1a, cM0) - Signed by Truitt Merle, MD on 08/13/2016       Breast cancer of lower-inner quadrant of right female breast ([place])   05/23/2016 Mammogram    Diagnostic mammogram and ultrasound showed suspicious architectural distortion within the right breast lower inner quadrant, measuring 1.3 cm, without sonographic corelate.      06/03/2016 Initial Biopsy    Right breast inferior lower quadrant core needle biopsy showed atypical ductal hyperplasia with calcifications      06/09/2016 Receptors her2    Breast biopsy showed ER 100% positive, PR 100% positive, HER-2 negative, Ki-67 40%      06/09/2016 Initial Diagnosis    Breast cancer of lower-inner quadrant of right female breast ([place])      06/09/2016 Initial Biopsy    Right breast inner quadrant core needle biopsy showed invasive ductal carcinoma and DCIS, grade 1-2      06/17/2016 Initial Biopsy    Right axillary lymph node core needle biopsy showed metastatic carcinoma      06/17/2016 Receptors her2    Axillary node biopsy showed ER 100% positive, PR 95% positive, HER-2 negative      06/19/2016 Imaging    Bilateral breast MRI showed locations in the lower inner right breast, largest 2.7X1.3X1.6cm, biopsy clips in 2 of this areas. There are abnormal right axillary lymph nodes showing second cortical's, no evidence of malignancy in the left breast.      07/29/2016 Surgery    Right lumpectomy and ALND      07/29/2016  Pathology Results    Right lumpectomy showed G3 IDC, DCIS, margins (-), LVI(-).  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  Gastrointestinal: Positive for diarrhea.  All other systems reviewed and are negative.   Past Medical History:  Diagnosis Date  . Allergy   . Arthritis   . Asthma   . Cancer ([place]) 07/29/2016   right breast  . Cataract of both eyes   . Chicken pox   . Colon polyps   . Dog bite of index finger 07/23/2016   Left index finger  . GERD (gastroesophageal reflux disease)   . Hyperlipidemia   . Hypertension   . IBS (irritable bowel syndrome)   . Phlebitis   . Pneumonia    hx of several yrs ago  . Shortness of breath dyspnea    with exertion only  . Sleep apnea    wears CPAP machine nightly    Past Surgical History:  Procedure Laterality Date  . BACK SURGERY     lower  . BREAST CYST EXCISION Right 2001   negative  . BREAST LUMPECTOMY WITH NEEDLE LOCALIZATION  AND AXILLARY LYMPH NODE DISSECTION Right 07/29/2016   Procedure: RIGHT BREAST LUMPECTOMY WITH DOUBLE NEEDLE LOCALIZATION AND COMPLETE RIGHT AXILLARY LYMPH NODE DISSECTION;  Surgeon: Haywood Ingram, MD;  Location: [facility] OR;  Service: General;  Laterality: Right;  . BREAST SURGERY Left 2000   Biopsy  . BUNIONECTOMY Bilateral 1998   great toe fusion on right foot  . COLONOSCOPY W/ POLYPECTOMY    . HERNIA REPAIR  2015   Umbillical  . NASAL SINUS SURGERY  2015  . PORTACATH PLACEMENT N/A 09/05/2016   Procedure: INSERTION PORT-A-CATH;  Surgeon: Haywood Ingram, MD;  Location: [facility] ORS;  Service: General;  Laterality: N/A;  . REPLACEMENT TOTAL KNEE Bilateral 2007  . TOTAL SHOULDER REPLACEMENT Left 2007    has Severe obesity (BMI >= 40) (HCC); Essential hypertension; HLD  (hyperlipidemia); Gastroesophageal reflux disease without esophagitis; Asthma, mild persistent; Urge incontinence; Seasonal allergies; Generalized anxiety disorder; Type 2 diabetes mellitus without complication (HCC); OSA on CPAP; Breast cancer of lower-inner quadrant of right female breast (HCC); Diarrhea; and Hypomagnesemia on her problem list.    is allergic to other.    Medication List       Accurate as of 11/25/16  3:54 PM. Always use your most recent med list.          albuterol-ipratropium 18-103 MCG/ACT inhaler Commonly known as:  COMBIVENT Inhale 2 puffs into the lungs every 4 (four) hours as needed for wheezing or shortness of breath.   ALPRAZolam 0.25 MG tablet Commonly known as:  XANAX TAKE ONE TABLET BY MOUTH TWICE DAILY AS NEEDED   calcium-vitamin D 500-200 MG-UNIT tablet Take 1 tablet by mouth daily.   cetirizine 10 MG tablet Commonly known as:  ZYRTEC Take 10 mg by mouth daily.   dexamethasone 4 MG tablet Commonly known as:  DECADRON TAKE ONE TABLET BY MOUTH TWICE DAILY *START  THE  DAY  BEFORE  TAXOTERE,  THEN  AGAIN  THE  DAY  AFTER  CHEMO  FOR  3  DAYS*   diphenoxylate-atropine 2.5-0.025 MG tablet Commonly known as:  LOMOTIL Take 2 tablets by mouth 4 (four) times daily as needed for diarrhea or loose stools.   EPINEPHrine 0.3 mg/0.3 mL Soaj injection Commonly known as:  EPI-PEN Inject 0.3 mg into the muscle once as needed (ALLERGIC REACTION).   Fish Oil 1000 MG Caps Take 1,000 mg by mouth daily.   glucosamine-chondroitin 500-400 MG tablet Take 1 tablet by mouth 2 (two) times daily.   HYDROcodone-acetaminophen 5-325 MG tablet Commonly known as:  NORCO/VICODIN Take 1-2 tablets by mouth every 4 (four) hours as needed.   lidocaine-prilocaine cream Commonly known as:  EMLA Apply to port at least 1 hour prior to chemo   magic mouthwash Soln Swish and Spit 5-10 mLs 4 times a day as needed.   metFORMIN 1000 MG tablet Commonly known as:   GLUCOPHAGE Take 1 tablet (1,000 mg total) by mouth 2 (two) times daily with a meal.   methocarbamol 500 MG tablet Commonly known as:  ROBAXIN Take 1 tablet (500 mg total) by mouth at bedtime as needed for muscle spasms.   Olmesartan-Amlodipine-HCTZ 40-5-25 MG Tabs TAKE ONE TABLET BY MOUTH ONCE DAILY   ondansetron 8 MG tablet Commonly known as:  ZOFRAN TAKE ONE TABLET BY MOUTH TWICE DAILY AS NEEDED FOR  REFRACTORY  NAUSEA/VOMITING.  START  ON  DAY  3  AFTER  CHEMO.   oxybutynin 10 MG 24 hr tablet Commonly known as:  DITROPAN-XL TAKE ONE TABLET BY   MOUTH AT BEDTIME   pravastatin 40 MG tablet Commonly known as:  PRAVACHOL Take 1 tablet (40 mg total) by mouth daily. PLEASE SCHEDULE ANNUAL EXAM   PROAIR HFA 108 (90 Base) MCG/ACT inhaler Generic drug:  albuterol Inhale 2 puffs into the lungs every 6 (six) hours as needed for wheezing.   prochlorperazine 10 MG tablet Commonly known as:  COMPAZINE TAKE ONE TABLET BY MOUTH EVERY 6 HOURS AS NEEDED FOR NAUSEA AND VOMITING   ranitidine 150 MG tablet Commonly known as:  ZANTAC Take 150 mg by mouth every evening.   vitamin B-12 1000 MCG tablet Commonly known as:  CYANOCOBALAMIN Take 1,000 mcg by mouth daily.        PHYSICAL EXAMINATION  Oncology Vitals 11/25/2016 11/14/2016  Height 152 cm -  Weight 106.278 kg -  Weight (lbs) 234 lbs 5 oz -  BMI (kg/m2) 45.76 kg/m2 -  Temp 97.7 98  Pulse 66 79  Resp 17 18  SpO2 96 97  BSA (m2) 2.12 m2 -   BP Readings from Last 2 Encounters:  11/25/16 (!) 117/50  11/14/16 (!) 169/73    Physical Exam  Constitutional: She is oriented to person, place, and time and well-developed, well-nourished, and in no distress.  HENT:  Head: Normocephalic and atraumatic.  Mouth/Throat: Oropharynx is clear and moist.  Eyes: Conjunctivae and EOM are normal. Pupils are equal, round, and reactive to light. Right eye exhibits no discharge. Left eye exhibits no discharge. No scleral icterus.  Neck: Normal  range of motion. Neck supple. No JVD present. No tracheal deviation present. No thyromegaly present.  Cardiovascular: Normal rate, regular rhythm, normal heart sounds and intact distal pulses.   Pulmonary/Chest: Effort normal and breath sounds normal. No respiratory distress. She has no wheezes. She has no rales. She exhibits no tenderness.  Abdominal: Soft. Bowel sounds are normal. She exhibits no distension and no mass. There is no tenderness. There is no rebound and no guarding.  Musculoskeletal: Normal range of motion. She exhibits no edema or tenderness.  Lymphadenopathy:    She has no cervical adenopathy.  Neurological: She is alert and oriented to person, place, and time. Gait normal.  Skin: Skin is warm and dry. No rash noted. No erythema. No pallor.  Psychiatric: Affect normal.  Nursing note and vitals reviewed.   LABORATORY DATA:. Appointment on 11/25/2016  Component Date Value Ref Range Status  . WBC 11/25/2016 21.8* 3.9 - 10.3 10e3/uL Final  . NEUT# 11/25/2016 17.7* 1.5 - 6.5 10e3/uL Final  . HGB 11/25/2016 11.4* 11.6 - 15.9 g/dL Final  . HCT 11/25/2016 36.2  34.8 - 46.6 % Final  . Platelets 11/25/2016 262  145 - 400 10e3/uL Final  . MCV 11/25/2016 89.1  79.5 - 101.0 fL Final  . MCH 11/25/2016 28.1  25.1 - 34.0 pg Final  . MCHC 11/25/2016 31.6  31.5 - 36.0 g/dL Final  . RBC 11/25/2016 4.06  3.70 - 5.45 10e6/uL Final  . RDW 11/25/2016 15.4* 11.2 - 14.5 % Final  . lymph# 11/25/2016 2.9  0.9 - 3.3 10e3/uL Final  . MONO# 11/25/2016 1.1* 0.1 - 0.9 10e3/uL Final  . Eosinophils Absolute 11/25/2016 0.0  0.0 - 0.5 10e3/uL Final  . Basophils Absolute 11/25/2016 0.1  0.0 - 0.1 10e3/uL Final  . NEUT% 11/25/2016 81.0* 38.4 - 76.8 % Final  . LYMPH% 11/25/2016 13.3* 14.0 - 49.7 % Final  . MONO% 11/25/2016 5.1  0.0 - 14.0 % Final  . EOS% 11/25/2016 0.1  0.0 -   7.0 % Final  . BASO% 11/25/2016 0.5  0.0 - 2.0 % Final  . Sodium 11/25/2016 140  136 - 145 mEq/L Final  . Potassium 11/25/2016  4.1  3.5 - 5.1 mEq/L Final  . Chloride 11/25/2016 102  98 - 109 mEq/L Final  . CO2 11/25/2016 26  22 - 29 mEq/L Final  . Glucose 11/25/2016 119  70 - 140 mg/dl Final  . BUN 11/25/2016 27.9* 7.0 - 26.0 mg/dL Final  . Creatinine 11/25/2016 0.9  0.6 - 1.1 mg/dL Final  . Total Bilirubin 11/25/2016 0.31  0.20 - 1.20 mg/dL Final  . Alkaline Phosphatase 11/25/2016 104  40 - 150 U/L Final  . AST 11/25/2016 12  5 - 34 U/L Final  . ALT 11/25/2016 18  0 - 55 U/L Final  . Total Protein 11/25/2016 5.8* 6.4 - 8.3 g/dL Final  . Albumin 11/25/2016 3.0* 3.5 - 5.0 g/dL Final  . Calcium 11/25/2016 9.8  8.4 - 10.4 mg/dL Final  . Anion Gap 11/25/2016 12* 3 - 11 mEq/L Final  . EGFR 11/25/2016 63* >90 ml/min/1.73 m2 Final  . Magnesium 11/25/2016 1.4* 1.5 - 2.5 mg/dl Final    RADIOGRAPHIC STUDIES: No results found.  ASSESSMENT/PLAN:    Hypomagnesemia Patient states she's had a good deal of diarrhea for the past few days; but it has since resolved.  Thus diarrhea episode was last night.  Magnesium is down to 1.4.  However, patient is able to both eat and drink today; and she was advised to take over-the-counter magnesium supplements as directed.  Diarrhea Patient had her last cycle of chemotherapy on 11/12/2016.  She complains of frequent diarrhea for the past few days.  She took Imodium 1-2 tablets as directed.  She states that her last diarrhea episode was last night.  She was able to get up this morning and eat an egg and cheese biscuit and "fatback" with no difficulty.  Patient appears mildly dehydrated; but will withhold IV fluid rehydration today.  Since patient states she can eat and drink with no further difficulty.  She was encouraged to push fluids at home.  She has plans to go eat chicken salad for lunch.  Patient was advised to continue with the Imodium for any future diarrhea episodes.  Also, patient was given a prescription for Lomotil to alternate with the Imodium if the diarrhea returns.  She  was also encouraged to let us know she needs to return Friday fluid rehydration at any time.  Breast cancer of lower-inner quadrant of right female breast [facility] Patient received cycle 4 of her TC chemotherapy on 11/12/2016; and then received Neulasta for growth factor support.  She is scheduled for a follow-up visit with radiation oncology on 12/02/2016.  She is scheduled for labs and a follow-up visit with Dr. Burr Medico on 12/25/2015.   Patient stated understanding of all instructions; and was in agreement with this plan of care. The patient knows to call the clinic with any problems, questions or concerns.   Total time spent with patient was 25 minutes;  with greater than 75 percent of that time spent in face to face counseling regarding patient's symptoms,  and coordination of care and follow up.  Disclaimer:This dictation was prepared with Dragon/digital dictation along with [employer]. Any transcriptional errors that result from this process are unintentional.  Drue Second, NP 11/25/2016

## 2016-11-25 NOTE — Assessment & Plan Note (Signed)
Patient had her last cycle of chemotherapy on 11/12/2016.  She complains of frequent diarrhea for the past few days.  She took Imodium 1-2 tablets as directed.  She states that her last diarrhea episode was last night.  She was able to get up this morning and eat an egg and cheese biscuit and "fatback" with no difficulty.  Patient appears mildly dehydrated; but will withhold IV fluid rehydration today.  Since patient states she can eat and drink with no further difficulty.  She was encouraged to push fluids at home.  She has plans to go eat chicken salad for lunch.  Patient was advised to continue with the Imodium for any future diarrhea episodes.  Also, patient was given a prescription for Lomotil to alternate with the Imodium if the diarrhea returns.  She was also encouraged to let us know she needs to return Friday fluid rehydration at any time.

## 2016-11-26 NOTE — Telephone Encounter (Signed)
Rx called in to pharmacy. 

## 2016-11-26 NOTE — Progress Notes (Signed)
Location of Breast Cancer: Right Breast  Histology per Pathology Report: 06/03/16  Diagnosis Breast, right, needle core biopsy, ILQ focal 1.3 cm asymmetry/distortion - ATYPICAL DUCTAL HYPERPLASIA WITH CALCIFICATIONS. - FIBROCYSTIC CHANGES WITH CALCIFICATIONS.  06/09/16 Diagnosis Breast, right, needle core biopsy, inner - INVASIVE DUCTAL CARCINOMA. - DUCTAL CARCINOMA IN SITU.  Receptor Status: ER(100%), PR (100%), Her2-neu (NEG), Ki-(47%)  06/17/16 Diagnosis Lymph node, needle/core biopsy, right, inferior, axilla to far lateral breast - METASTATIC CARCINOMA  07/29/16 Diagnosis 1. Breast, lumpectomy, Right INVASIVE DUCTAL CARCINOMA, GRADE 3, SPANNING 1.5 CM DUCTAL CARCINOMA IN SITU IS PRESENT ALL MARGINS OF RESECTION ARE NEGATIVE FOR CARCINOMA 2. Lymph nodes, regional resection, Right axillary contents METASTATIC BREAST DUCTAL CARCINOMA IN ONE OF SIXTEEN LYMPH NODES (1/16)  Did patient present with symptoms or was this found on screening mammography?: It was found on a screening mammogram.   Past/Anticipated interventions by surgeon, if any: 07/29/16 Procedure:                 Right breast lumpectomy with double wire needle localization; complete right axillary lymph node dissection Surgeon:                     Edsel Petrin. Dalbert Batman, M.D., [facility]  09/05/16 Procedure:                 Insertion of 8 French ClearVue tunneled venous vascular access device, use of fluoroscopy for guidance and positioning Surgeon:                     Edsel Petrin. Dalbert Batman, M.D., [facility]  Past/Anticipated interventions by medical oncology, if any: Dr. Burr Medico 11/12/16  -Adjuvant chemotherapy with docetaxel and Cytoxan (TC) every 3 weeks, starting on 09/09/2016 completed 11/12/16  -Given the strong ER and PR positivity, I do recommend adjuvant aromatase inhibitor to reduce her risk of cancer recurrence  - She has an appointment scheduled for 12/24/16.  Lymphedema issues, if any: Yes. She has seen PT but has been  signed off. She does have a compression sleeve for daytime and nighttime as needed. She has good arm mobility.   Pain issues, if any:  She denies  SAFETY ISSUES:  Prior radiation? No  Pacemaker/ICD? No  Possible current pregnancy? No  Is the patient on methotrexate? No  Current Complaints / other details:   GYN HISTORY  Menarchal: 13 LMP: 42 Contraceptive: 4-5 years HRT: 3 years  G2P2: no breast feeding, daughter 71 yo and son is 54 yo.  BP 129/70   Pulse 77   Temp 98.8 F (37.1 C)   Ht 5' (1.524 m)   Wt 237 lb 3.2 oz (107.6 kg)   SpO2 98% Comment: room air  BMI 46.32 kg/m     Lyris Hitchman, Stephani Police, RN 11/26/2016,11:36 AM

## 2016-11-27 ENCOUNTER — Telehealth: Payer: Self-pay | Admitting: *Deleted

## 2016-11-27 NOTE — Telephone Encounter (Signed)
Tiffany Arnold left v/m requesting cb about status of alprazolam refill; I spoke with pt and [facility] [address] [facility]) said rx ready for pick up. Tiffany Arnold voiced understanding and will discuss increasing the dosage for alprazolam at 12/03/16 appt.

## 2016-11-27 NOTE — Telephone Encounter (Signed)
Received call from pt's husband & he reports that pt has had 2 large bouts of diarrhea this am & was unable to make it to the bathroom.  Was able to get him to get pt on the phone & she has only had the two stools & took imodium & lomotil after each.  She is drinking fluids well & is not nauseated or vomiting.  Instructed to cont drinking plenty of fluids & try clear fluids-gatorade if possible.  Informed that she should start with 2 imodium after start of diarrhea & then 1 after each loose stool up to eight/d & she can take the lomotil in between if she needs to.  Instructed to hold solid food for now & just do clear liquids to give bowel rest & if diarrhea subsides, can add BRAT diet & if tol. then advance to regular diet slowly.  She denies any fever or other symptoms.  Instructed to touch base again with [place] today to let us know how she is doing.  Discussed with Dr Burr Medico who is in agreement.

## 2016-12-01 ENCOUNTER — Telehealth: Payer: Self-pay | Admitting: *Deleted

## 2016-12-01 NOTE — Telephone Encounter (Signed)
TCT patient to follow up on problems with diarrhea last week. Pt states she is feeling better and diarrhea has resolved. She is eating well and drinking fluids. Regular BM over the weekend. She has initial appt in Rad. Onc. Tomorrow and a physical with her PCP as well. No other questions or concerns

## 2016-12-02 ENCOUNTER — Encounter: Payer: Medicare PPO | Admitting: Internal Medicine

## 2016-12-02 ENCOUNTER — Ambulatory Visit
Admission: RE | Admit: 2016-12-02 | Discharge: 2016-12-02 | Disposition: A | Discharge status: Home / Self Care | Payer: Medicare PPO | Source: Ambulatory Visit | Attending: Radiation Oncology | Admitting: Radiation Oncology

## 2016-12-02 ENCOUNTER — Encounter: Payer: Self-pay | Admitting: Radiation Oncology

## 2016-12-02 VITALS — BP 129/70 | HR 77 | Temp 98.8°F | Ht 60.0 in | Wt 237.2 lb

## 2016-12-02 DIAGNOSIS — Z8249 Family history of ischemic heart disease and other diseases of the circulatory system: Secondary | ICD-10-CM | POA: Diagnosis not present

## 2016-12-02 DIAGNOSIS — Z8261 Family history of arthritis: Secondary | ICD-10-CM | POA: Insufficient documentation

## 2016-12-02 DIAGNOSIS — Z170 Estrogen receptor positive status [ER+]: Secondary | ICD-10-CM | POA: Insufficient documentation

## 2016-12-02 DIAGNOSIS — M1990 Unspecified osteoarthritis, unspecified site: Secondary | ICD-10-CM | POA: Diagnosis not present

## 2016-12-02 DIAGNOSIS — H269 Unspecified cataract: Secondary | ICD-10-CM | POA: Insufficient documentation

## 2016-12-02 DIAGNOSIS — Z510 Encounter for antineoplastic radiation therapy: Secondary | ICD-10-CM | POA: Diagnosis not present

## 2016-12-02 DIAGNOSIS — Z823 Family history of stroke: Secondary | ICD-10-CM | POA: Diagnosis not present

## 2016-12-02 DIAGNOSIS — C773 Secondary and unspecified malignant neoplasm of axilla and upper limb lymph nodes: Secondary | ICD-10-CM | POA: Insufficient documentation

## 2016-12-02 DIAGNOSIS — K589 Irritable bowel syndrome without diarrhea: Secondary | ICD-10-CM | POA: Diagnosis not present

## 2016-12-02 DIAGNOSIS — K219 Gastro-esophageal reflux disease without esophagitis: Secondary | ICD-10-CM | POA: Diagnosis not present

## 2016-12-02 DIAGNOSIS — Z809 Family history of malignant neoplasm, unspecified: Secondary | ICD-10-CM | POA: Insufficient documentation

## 2016-12-02 DIAGNOSIS — C50311 Malignant neoplasm of lower-inner quadrant of right female breast: Secondary | ICD-10-CM

## 2016-12-02 DIAGNOSIS — Z96612 Presence of left artificial shoulder joint: Secondary | ICD-10-CM | POA: Diagnosis not present

## 2016-12-02 DIAGNOSIS — E785 Hyperlipidemia, unspecified: Secondary | ICD-10-CM | POA: Diagnosis not present

## 2016-12-02 DIAGNOSIS — I890 Lymphedema, not elsewhere classified: Secondary | ICD-10-CM | POA: Diagnosis not present

## 2016-12-02 DIAGNOSIS — Z79899 Other long term (current) drug therapy: Secondary | ICD-10-CM | POA: Diagnosis not present

## 2016-12-02 DIAGNOSIS — Z96653 Presence of artificial knee joint, bilateral: Secondary | ICD-10-CM | POA: Diagnosis not present

## 2016-12-02 DIAGNOSIS — J45909 Unspecified asthma, uncomplicated: Secondary | ICD-10-CM | POA: Diagnosis not present

## 2016-12-02 DIAGNOSIS — G4730 Sleep apnea, unspecified: Secondary | ICD-10-CM | POA: Insufficient documentation

## 2016-12-02 DIAGNOSIS — Z87891 Personal history of nicotine dependence: Secondary | ICD-10-CM | POA: Diagnosis not present

## 2016-12-02 DIAGNOSIS — Z86010 Personal history of colonic polyps: Secondary | ICD-10-CM | POA: Diagnosis not present

## 2016-12-02 DIAGNOSIS — I10 Essential (primary) hypertension: Secondary | ICD-10-CM | POA: Insufficient documentation

## 2016-12-02 DIAGNOSIS — Z9221 Personal history of antineoplastic chemotherapy: Secondary | ICD-10-CM | POA: Diagnosis not present

## 2016-12-02 DIAGNOSIS — Z9889 Other specified postprocedural states: Secondary | ICD-10-CM | POA: Diagnosis not present

## 2016-12-02 NOTE — Progress Notes (Signed)
Radiation Oncology         [phone] ________________________________  Initial outpatient Consultation  Name: Tiffany Arnold MRN: [id number]  Date: 12/02/2016  DOB: Aug 25, 1941  AJ:GOTLX, Rollene Fare, NP  Truitt Merle, MD   REFERRING PHYSICIAN: Truitt Merle, MD  DIAGNOSIS:    ICD-9-CM ICD-10-CM   1. 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 female who presented with distortion in the right breast on mammography earlier this year.  Biopsy on 6-13 was positive for ADH but no malignancy.   On 06-09-16 biopsy showed IDC with DCIS, ER/PR + Her2 -, and biopsy of a suspicious lymph node in the axilla on 6-27 was positive for carcinoma. MRI on 6-29 of the breasts showed rim enhancement in 3 locations, which are relatively closely approximated in the lower inner right breast, spanning an area of 4.5 x 2.1 x 2.3 cm. Two of these are associated with susceptibility artifact reflecting biopsy clips. Abnormal right axillary lymph nodes showing thickenedcortices, the largest of which reflects the biopsied, metastatic, lymph node. No evidence of malignancy in the left breast.  She underwent lumpectomy and axillary dissection on 07-29-16 revealing a 1.5 cm tumor with negative margins by 60m to invsaive and in situ disease; 1/16 nodes in axilla were positive with ECE.  She then received adjuvant Docetaxel and Cytoxan which she finished on 11-12-16.  Lymphedema issues, if any: Yes. She has seen PT but has been signed off. She does have a compression sleeve for daytime and nighttime as needed. She has good arm mobility.    Pain issues, if any:  She denies   SAFETY  ISSUES:  Prior radiation? No  Pacemaker/ICD? No  Possible current pregnancy? No  Is the patient on methotrexate? No   Current Complaints / other details:   GYN HISTORY  Menarchal: 13 LMP: 558Contraceptive: 4-5 years HRT: 3 years  G2P2: no breast feeding, daughter 495yo and son is 422yo  PREVIOUS RADIATION THERAPY: No  PAST MEDICAL HISTORY:  has a past medical history of Allergy; Arthritis; Asthma; [place]); Cataract of both eyes; Chicken pox; Colon polyps; Dog bite of index finger (07/23/2016); GERD (gastroesophageal reflux disease); Hyperlipidemia; Hypertension; IBS (irritable bowel syndrome); Phlebitis; Pneumonia; Shortness of breath dyspnea; and Sleep apnea.    PAST SURGICAL HISTORY: Past Surgical History:  Procedure Laterality Date  . BACK SURGERY     lower  . BREAST CYST EXCISION Right 2001   negative  . BREAST LUMPECTOMY WITH NEEDLE LOCALIZATION AND AXILLARY LYMPH NODE DISSECTION Right 07/29/2016   Procedure: RIGHT BREAST LUMPECTOMY WITH DOUBLE NEEDLE LOCALIZATION AND COMPLETE RIGHT AXILLARY LYMPH NODE DISSECTION;  Surgeon: HFanny Skates MD;  Location: [facility]  Service: General;  Laterality: Right;  . BREAST SURGERY Left 2000   Biopsy  . BUNIONECTOMY Bilateral 1998   great toe fusion on right foot  . COLONOSCOPY W/ POLYPECTOMY    . HERNIA REPAIR  [id number]  [facility] SURGERY  2015  . PORTACATH PLACEMENT N/A 09/05/2016   Procedure: INSERTION PORT-A-CATH;  Surgeon: HFanny Skates MD;  Location: [facility];  Service: General;  Laterality: N/A;  . REPLACEMENT TOTAL KNEE Bilateral 2007  .  TOTAL SHOULDER REPLACEMENT Left 2007    FAMILY HISTORY: family history includes Arthritis in her mother; Cancer in her father and paternal grandmother; Hypertension in her father, maternal grandfather, maternal grandmother, mother, paternal grandfather, and paternal grandmother; Rheum arthritis in her maternal grandfather; Stroke in her father, maternal  grandfather, and mother.  SOCIAL HISTORY:  reports that she quit smoking about 25 years ago. She has a 50.00 pack-year smoking history. She has never used smokeless tobacco. She reports that she drinks alcohol. She reports that she does not use drugs.  ALLERGIES: Other  MEDICATIONS:  Current Outpatient Prescriptions  Medication Sig Dispense Refill  . albuterol (PROAIR HFA) 108 (90 BASE) MCG/ACT inhaler Inhale 2 puffs into the lungs every 6 (six) hours as needed for wheezing.     Marland Kitchen albuterol-ipratropium (COMBIVENT) 18-103 MCG/ACT inhaler Inhale 2 puffs into the lungs every 4 (four) hours as needed for wheezing or shortness of breath.     . ALPRAZolam (XANAX) 0.25 MG tablet TAKE ONE TABLET BY MOUTH TWICE DAILY AS NEEDED 60 tablet 0  . Calcium Carbonate-Vitamin D (CALCIUM-VITAMIN D) 500-200 MG-UNIT tablet Take 1 tablet by mouth daily.    . cetirizine (ZYRTEC) 10 MG tablet Take 10 mg by mouth daily.    . diphenoxylate-atropine (LOMOTIL) 2.5-0.025 MG tablet Take 2 tablets by mouth 4 (four) times daily as needed for diarrhea or loose stools. 30 tablet 0  . EPINEPHrine 0.3 mg/0.3 mL IJ SOAJ injection Inject 0.3 mg into the muscle once as needed (ALLERGIC REACTION).     Marland Kitchen glucosamine-chondroitin 500-400 MG tablet Take 1 tablet by mouth 2 (two) times daily.    Marland Kitchen lidocaine-prilocaine (EMLA) cream Apply to port at least 1 hour prior to chemo 30 g 1  . metFORMIN (GLUCOPHAGE) 1000 MG tablet Take 1 tablet (1,000 mg total) by mouth 2 (two) times daily with a meal. 180 tablet 0  . methocarbamol (ROBAXIN) 500 MG tablet Take 1 tablet (500 mg total) by mouth at bedtime as needed for muscle spasms. 10 tablet 0  . Olmesartan-Amlodipine-HCTZ 40-5-25 MG TABS TAKE ONE TABLET BY MOUTH ONCE DAILY 90 tablet 1  . pravastatin (PRAVACHOL) 40 MG tablet Take 1 tablet (40 mg total) by mouth daily. PLEASE SCHEDULE ANNUAL EXAM 90 tablet 0  . ranitidine (ZANTAC) 150 MG tablet Take 150 mg by mouth every evening.     . vitamin  B-12 (CYANOCOBALAMIN) 1000 MCG tablet Take 1,000 mcg by mouth daily.    Marland Kitchen dexamethasone (DECADRON) 4 MG tablet TAKE ONE TABLET BY MOUTH TWICE DAILY *START  THE  DAY  BEFORE  TAXOTERE,  THEN  AGAIN  THE  DAY  AFTER  CHEMO  FOR  3  DAYS* (Patient not taking: Reported on 12/02/2016) 30 tablet 1  . HYDROcodone-acetaminophen (NORCO/VICODIN) 5-325 MG tablet Take 1-2 tablets by mouth every 4 (four) hours as needed. (Patient not taking: Reported on 12/02/2016) 50 tablet 0  . magic mouthwash SOLN Swish and Spit 5-10 mLs 4 times a day as needed. (Patient not taking: Reported on 12/02/2016) 240 mL 1  . Omega-3 Fatty Acids (FISH OIL) 1000 MG CAPS Take 1,000 mg by mouth daily.    . ondansetron (ZOFRAN) 8 MG tablet TAKE ONE TABLET BY MOUTH TWICE DAILY AS NEEDED FOR  REFRACTORY  NAUSEA/VOMITING.  START  ON  DAY  3  AFTER  CHEMO. (Patient not taking: Reported on 12/02/2016) 30 tablet 1  . oxybutynin (DITROPAN-XL) 10 MG 24 hr tablet TAKE ONE TABLET BY MOUTH AT BEDTIME (Patient  not taking: Reported on 12/02/2016) 90 tablet 1  . prochlorperazine (COMPAZINE) 10 MG tablet TAKE ONE TABLET BY MOUTH EVERY 6 HOURS AS NEEDED FOR NAUSEA AND VOMITING (Patient not taking: Reported on 12/02/2016) 30 tablet 1   No current facility-administered medications for this encounter.     REVIEW OF SYSTEMS: as above.   PHYSICAL EXAM:  height is 5' (1.524 m) and weight is 237 lb 3.2 oz (107.6 kg). Her temperature is 98.8 F (37.1 C). Her blood pressure is 129/70 and her pulse is 77. Her oxygen saturation is 98%.   General: Alert and oriented, in no acute distress HEENT: alopecia.  Head is normocephalic. Extraocular movements are intact. Oropharynx is clear. Neck: Neck is supple, no palpable cervical or supraclavicular lymphadenopathy. Heart: Regular in rate and rhythm with no murmurs, rubs, or gallops. Chest: Clear to auscultation bilaterally, with no rhonchi, wheezes, or rales. Abdomen: Soft, nontender, nondistended, with no rigidity  or guarding. Extremities: RUE edema and bilateral pedal edema Lymphatics: see Neck Exam Skin: erythematous line over upper RUE related to chaffing from edema sleeve Musculoskeletal: symmetric strength and muscle tone throughout. Neurologic: Cranial nerves II through XII are grossly intact. No obvious focalities. Speech is fluent. Coordination is intact. Psychiatric: Judgment and insight are intact. Affect is appropriate. Breasts: mild erythema, right breast, with well healed Rt axillary and LIQ scars . No palpable masses appreciated in the breasts or axillae bilaterally  ECOG = 1  0 - Asymptomatic (Fully active, able to carry on all predisease activities without restriction)  1 - Symptomatic but completely ambulatory (Restricted in physically strenuous activity but ambulatory and able to carry out work of a light or sedentary nature. For example, light housework, office work)  2 - Symptomatic, <50% in bed during the day (Ambulatory and capable of all self care but unable to carry out any work activities. Up and about more than 50% of waking hours)  3 - Symptomatic, >50% in bed, but not bedbound (Capable of only limited self-care, confined to bed or chair 50% or more of waking hours)  4 - Bedbound (Completely disabled. Cannot carry on any self-care. Totally confined to bed or chair)  5 - Death   Eustace Pen MM, Creech RH, Tormey DC, et al. [phone]). "Toxicity and response criteria of the [facility]". [place] Oncol. 5 (6): 649-55   LABORATORY DATA:  Lab Results  Component Value Date   WBC 21.8 (H) 11/25/2016   HGB 11.4 (L) 11/25/2016   HCT 36.2 11/25/2016   MCV 89.1 11/25/2016   PLT 262 11/25/2016   CMP     Component Value Date/Time   NA 140 11/25/2016 1005   K 4.1 11/25/2016 1005   CL 98 (L) 07/23/2016 1501   CO2 26 11/25/2016 1005   GLUCOSE 119 11/25/2016 1005   BUN 27.9 (H) 11/25/2016 1005   CREATININE 0.9 11/25/2016 1005   CALCIUM 9.8 11/25/2016 1005    PROT 5.8 (L) 11/25/2016 1005   ALBUMIN 3.0 (L) 11/25/2016 1005   AST 12 11/25/2016 1005   ALT 18 11/25/2016 1005   ALKPHOS 104 11/25/2016 1005   BILITOT 0.31 11/25/2016 1005   GFRNONAA >60 07/29/2016 2029   GFRAA >60 07/29/2016 2029         RADIOGRAPHY: No results found.    IMPRESSION/PLAN: Right breast cancer, stage II.  It was a pleasure meeting the patient today. We discussed the risks, benefits, and side effects of radiotherapy. I recommend radiotherapy to the right breast  and regional nodes to reduce her risk of locoregional recurrence by 2/3.  We discussed that radiation would take approximately 6-7 weeks to complete and will hold RT until January per her wishes. We spoke about acute effects including skin irritation and fatigue and progressive lymphedema as well as much less common late effects including internal organ injury or irritation. We spoke about the latest technology that is used to minimize the risk of late effects for patients undergoing radiotherapy to the breast or chest wall. No guarantees of treatment were given. The patient is enthusiastic about proceeding with treatment. I look forward to participating in the patient's care.  I will conduct her CT simulation/treatment planning today.  Advised patient to continue PT for lymphedema, right arm, per their recommendations and wear sleeve as advised.     I spent 55 minutes  face to face with the patient and more than 50% of that time was spent in counseling and/or coordination of care.   __________________________________________   Eppie Gibson, MD

## 2016-12-02 NOTE — Progress Notes (Addendum)
  Radiation Oncology         [phone] ________________________________  Name: GENIVIEVE CALLIER MRN: [id number]  Date: 12/02/2016  DOB: [birth year]  SIMULATION AND TREATMENT PLANNING NOTE    outpatient  DIAGNOSIS:     ICD-9-CM ICD-10-CM   1. Malignant neoplasm of lower-inner quadrant of right breast of female, estrogen receptor positive ([place]) 174.3 C50.311    V86.0 Z17.0     NARRATIVE:  The patient was brought to the [facility].  Identity was confirmed.  All relevant records and images related to the planned course of therapy were reviewed.  The patient freely provided informed written consent to proceed with treatment after reviewing the details related to the planned course of therapy. The consent form was witnessed and verified by the simulation staff.    Then, the patient was set-up in a stable reproducible supine position for radiation therapy with her ipsilateral arm over her head, and her upper body secured in a custom-made Vac-lok device.  CT images were obtained.  Surface markings were placed.  The CT images were loaded into the planning software.    TREATMENT PLANNING NOTE: Treatment planning then occurred.  The radiation prescription was entered and confirmed.     A total of 5 medically necessary complex treatment devices were fabricated and supervised by me: 4 fields with MLCs for custom blocks to protect heart, and lungs;  and, a Vac-lok. MORE COMPLEX DEVICES MAY BE MADE IN DOSIMETRY FOR FIELD IN FIELD BEAMS FOR DOSE HOMOGENEITY.  I have requested : 3D Simulation which is medically necessary to give adequate dose to at risk tissues while sparing lungs and heart.  I have requested a DVH of the following structures: lungs, heart, lumpectomy cavity, cord, esophagus.    The patient will receive 50.4 Gy in 28 fractions to the right breast with 2 tangential fields.  The R axilla and SCV nodes will receive 45Gy in 25 fractions with 2 opposed beams. This will not be  followed by a boost.  Optical Surface Tracking Plan:  Since intensity modulated radiotherapy (IMRT) and 3D conformal radiation treatment methods are predicated on accurate and precise positioning for treatment, intrafraction motion monitoring is medically necessary to ensure accurate and safe treatment delivery. The ability to quantify intrafraction motion without excessive ionizing radiation dose can only be performed with optical surface tracking. Accordingly, surface imaging offers the opportunity to obtain 3D measurements of patient position throughout IMRT and 3D treatments without excessive radiation exposure. I am ordering optical surface tracking for this patient's upcoming course of radiotherapy.  ________________________________   Reference:  Ursula Alert, J, et al. Surface imaging-based analysis of intrafraction motion for breast radiotherapy patients.Journal of [place], n. 6, nov. 2014. ISSN [id number].  Available at: <http://www.jacmp.org/index.php/jacmp/article/view/4957>.    -----------------------------------  Eppie Gibson, MD

## 2016-12-03 ENCOUNTER — Encounter: Payer: Self-pay | Admitting: Internal Medicine

## 2016-12-03 ENCOUNTER — Ambulatory Visit (INDEPENDENT_AMBULATORY_CARE_PROVIDER_SITE_OTHER): Payer: Medicare PPO | Admitting: Internal Medicine

## 2016-12-03 VITALS — BP 126/70 | HR 86 | Temp 98.6°F | Ht 61.0 in | Wt 234.0 lb

## 2016-12-03 DIAGNOSIS — R269 Unspecified abnormalities of gait and mobility: Secondary | ICD-10-CM

## 2016-12-03 DIAGNOSIS — J301 Allergic rhinitis due to pollen: Secondary | ICD-10-CM

## 2016-12-03 DIAGNOSIS — Z170 Estrogen receptor positive status [ER+]: Secondary | ICD-10-CM

## 2016-12-03 DIAGNOSIS — K580 Irritable bowel syndrome with diarrhea: Secondary | ICD-10-CM

## 2016-12-03 DIAGNOSIS — Z0000 Encounter for general adult medical examination without abnormal findings: Secondary | ICD-10-CM | POA: Diagnosis not present

## 2016-12-03 DIAGNOSIS — N3941 Urge incontinence: Secondary | ICD-10-CM

## 2016-12-03 DIAGNOSIS — Z9989 Dependence on other enabling machines and devices: Secondary | ICD-10-CM

## 2016-12-03 DIAGNOSIS — J4530 Mild persistent asthma, uncomplicated: Secondary | ICD-10-CM

## 2016-12-03 DIAGNOSIS — G4733 Obstructive sleep apnea (adult) (pediatric): Secondary | ICD-10-CM

## 2016-12-03 DIAGNOSIS — F411 Generalized anxiety disorder: Secondary | ICD-10-CM

## 2016-12-03 DIAGNOSIS — E119 Type 2 diabetes mellitus without complications: Secondary | ICD-10-CM

## 2016-12-03 DIAGNOSIS — C50311 Malignant neoplasm of lower-inner quadrant of right female breast: Secondary | ICD-10-CM

## 2016-12-03 DIAGNOSIS — E7800 Pure hypercholesterolemia, unspecified: Secondary | ICD-10-CM

## 2016-12-03 DIAGNOSIS — I10 Essential (primary) hypertension: Secondary | ICD-10-CM

## 2016-12-03 DIAGNOSIS — K219 Gastro-esophageal reflux disease without esophagitis: Secondary | ICD-10-CM

## 2016-12-03 LAB — LIPID PANEL
CHOLESTEROL: 187 mg/dL (ref 0–200)
HDL: 41.2 mg/dL (ref >39.00)
NonHDL: 146.02
TRIGLYCERIDES: 221 mg/dL — AB (ref 0.0–149.0)
Total CHOL/HDL Ratio: 5
VLDL: 44.2 mg/dL — ABNORMAL HIGH (ref 0.0–40.0)

## 2016-12-03 LAB — LDL CHOLESTEROL, DIRECT: Direct LDL: 113 mg/dL

## 2016-12-03 LAB — HEMOGLOBIN A1C: Hgb A1c MFr Bld: 7.4 % — ABNORMAL HIGH (ref 4.6–6.5)

## 2016-12-03 NOTE — Progress Notes (Signed)
HPI:  Pt presents to the clinic today for her Medicare Wellness Exam. She is also due for follow up of chronic conditions:  Environmental Allergies: Triggered by pollen. She takes Zyrtec as needed with good relief, but reports she is not currently taking it at this time.  Arthritis: Mainly in her hips ,knees and back. She has had shoulder, knee replacements as well as a back surgery. She takes Glucosamine daily and Ibuprofen as needed. She does not really take the Robaxin at all.   Asthma: She uses the Combivent only about 1 x a year. She is not on any maintenance inhalers. She does not remember the last time she had spirometry.   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 She wants to know if she can stop taking the Decadron now that she is finished with chemo. She takes Zofran/Compazine as needed for nausea, with good relief.   GERD: She is not sure what triggers this. She denies breakthrough symptoms on Zantac.  HLD: He last LDL was 98, 03/2016. She is taking Pravastatin as prescribed but not the Fish Oil. She has been trying to consume a low fat diet.  HTN: She is taking Olmesartan-Amlodipine-HCTZ. Her BP today is 126/70. ECG from 07/2016 reviewed.  IBS: She reports she is consistently having more diarrhea. She has been more anxious lately with having to deal with all the cancer treatment. She also thinks it may be coming from the Metformin. She takes Lomotil as needed.  Anxiety: This has been worse lately. She reports this is triggered by going through cancer and a difficult relationship with her husband, who has dementia. She is taking the Xanax as needed but thinks the dose needs to be increased or adjusted so that she can take it more frequently.  OSA: She wears a CPAP machine. Last titration was 09/2016. She gets an average of 6-7 hours of sleep at night. She feels rested when she wakes up. She denies daytime fatigue.  OAB: She  takes Ditropan with good relief.  She is requesting a walker at this time due to gait issues, secondary to weakness associated with cancer treatment.   Past Medical History:  Diagnosis Date  . Allergy   . Arthritis   . Asthma   . Cancer ([place]) 07/29/2016   right breast  . Cataract of both eyes   . Chicken pox   . Colon polyps   . Dog bite of index finger 07/23/2016   Left index finger  . GERD (gastroesophageal reflux disease)   . Hyperlipidemia   . Hypertension   . IBS (irritable bowel syndrome)   . Phlebitis   . Pneumonia    hx of several yrs ago  . Shortness of breath dyspnea    with exertion only  . Sleep apnea    wears CPAP machine nightly    Current Outpatient Prescriptions  Medication Sig Dispense Refill  . albuterol (PROAIR HFA) 108 (90 BASE) MCG/ACT inhaler Inhale 2 puffs into the lungs every 6 (six) hours as needed for wheezing.     Marland Kitchen albuterol-ipratropium (COMBIVENT) 18-103 MCG/ACT inhaler Inhale 2 puffs into the lungs every 4 (four) hours as needed for wheezing or shortness of breath.     . ALPRAZolam (XANAX) 0.25 MG tablet TAKE ONE TABLET BY MOUTH TWICE DAILY AS NEEDED 60 tablet 0  . Calcium Carbonate-Vitamin D (CALCIUM-VITAMIN D) 500-200 MG-UNIT tablet Take 1 tablet by mouth daily.    . cetirizine (  ZYRTEC) 10 MG tablet Take 10 mg by mouth daily.    Marland Kitchen dexamethasone (DECADRON) 4 MG tablet TAKE ONE TABLET BY MOUTH TWICE DAILY *START  THE  DAY  BEFORE  TAXOTERE,  THEN  AGAIN  THE  DAY  AFTER  CHEMO  FOR  3  DAYS* (Patient not taking: Reported on 12/02/2016) 30 tablet 1  . diphenoxylate-atropine (LOMOTIL) 2.5-0.025 MG tablet Take 2 tablets by mouth 4 (four) times daily as needed for diarrhea or loose stools. 30 tablet 0  . EPINEPHrine 0.3 mg/0.3 mL IJ SOAJ injection Inject 0.3 mg into the muscle once as needed (ALLERGIC REACTION).     Marland Kitchen glucosamine-chondroitin 500-400 MG tablet Take 1 tablet by mouth 2 (two) times daily.    Marland Kitchen HYDROcodone-acetaminophen (NORCO/VICODIN)  5-325 MG tablet Take 1-2 tablets by mouth every 4 (four) hours as needed. (Patient not taking: Reported on 12/02/2016) 50 tablet 0  . lidocaine-prilocaine (EMLA) cream Apply to port at least 1 hour prior to chemo 30 g 1  . magic mouthwash SOLN Swish and Spit 5-10 mLs 4 times a day as needed. (Patient not taking: Reported on 12/02/2016) 240 mL 1  . metFORMIN (GLUCOPHAGE) 1000 MG tablet Take 1 tablet (1,000 mg total) by mouth 2 (two) times daily with a meal. 180 tablet 0  . methocarbamol (ROBAXIN) 500 MG tablet Take 1 tablet (500 mg total) by mouth at bedtime as needed for muscle spasms. 10 tablet 0  . Olmesartan-Amlodipine-HCTZ 40-5-25 MG TABS TAKE ONE TABLET BY MOUTH ONCE DAILY 90 tablet 1  . Omega-3 Fatty Acids (FISH OIL) 1000 MG CAPS Take 1,000 mg by mouth daily.    . ondansetron (ZOFRAN) 8 MG tablet TAKE ONE TABLET BY MOUTH TWICE DAILY AS NEEDED FOR  REFRACTORY  NAUSEA/VOMITING.  START  ON  DAY  3  AFTER  CHEMO. (Patient not taking: Reported on 12/02/2016) 30 tablet 1  . oxybutynin (DITROPAN-XL) 10 MG 24 hr tablet TAKE ONE TABLET BY MOUTH AT BEDTIME (Patient not taking: Reported on 12/02/2016) 90 tablet 1  . pravastatin (PRAVACHOL) 40 MG tablet Take 1 tablet (40 mg total) by mouth daily. PLEASE SCHEDULE ANNUAL EXAM 90 tablet 0  . prochlorperazine (COMPAZINE) 10 MG tablet TAKE ONE TABLET BY MOUTH EVERY 6 HOURS AS NEEDED FOR NAUSEA AND VOMITING (Patient not taking: Reported on 12/02/2016) 30 tablet 1  . ranitidine (ZANTAC) 150 MG tablet Take 150 mg by mouth every evening.     . vitamin B-12 (CYANOCOBALAMIN) 1000 MCG tablet Take 1,000 mcg by mouth daily.     No current facility-administered medications for this visit.     Allergies  Allergen Reactions  . Other Swelling and Shortness Of Breath    Cats, dogs, mold Induced asthma Cats, dogs, mold    Family History  Problem Relation Age of Onset  . Arthritis Mother   . Stroke Mother   . Hypertension Mother   . Cancer Father     Prostate   . Stroke Father   . Hypertension Father   . Hypertension Maternal Grandmother   . Rheum arthritis Maternal Grandfather   . Stroke Maternal Grandfather   . Hypertension Maternal Grandfather   . Cancer Paternal Grandmother     Colon  . Hypertension Paternal Grandmother   . Hypertension Paternal Grandfather   . Breast cancer Neg Hx     Social History   Social History  . Marital status: Married    Spouse name: N/A  . Number of children: N/A  .  Years of education: N/A   Occupational History  . Not on file.   Social History Main Topics  . Smoking status: Former Smoker    Packs/day: 2.00    Years: 25.00    Quit date: 12/22/1990  . Smokeless tobacco: Never Used     Comment: quit 24 years ago  . Alcohol use Yes     Comment: rare  . Drug use: No  . Sexual activity: Not Currently   Other Topics Concern  . Not on file   Social History Narrative  . No narrative on file    Hospitiliaztions: None  Health Maintenance:    Flu: 08/2016  Tetanus: 06/2014  Pneumovax: 01/2012  Prevnar: 03/2015  Zostavax: 01/2014  Mammogram: 05/2016  Pap Smear: 12/2013  Bone Density: 2014  Colon Screening: 2015  Eye Doctor: annually, last 07/2016  Dental Exam: biannually   Providers:   PCP: [name], NP-C  Oncology: Dr. Burr Medico  Radiation Oncology: Dr. Isidore Moos  Surgical Oncology: Dr. Dalbert Batman  ENT: Dr. Carlis Abbott  Orthopedics: Dr. Marry Guan  I have personally reviewed and have noted:  1. The patient's medical and social history 2. Their use of alcohol, tobacco or illicit drugs 3. Their current medications and supplements 4. The patient's functional ability including ADL's, fall risks, home safety risks and hearing or visual impairment. 5. Diet and physical activities 6. Evidence for depression or mood disorder  Subjective:   Review of Systems:   Constitutional: Denies fever, malaise, fatigue, headache or abrupt weight changes.  HEENT: Denies eye pain, eye redness, ear pain, ringing in the  ears, wax buildup, runny nose, nasal congestion, bloody nose, or sore throat. Respiratory: Denies difficulty breathing, shortness of breath, cough or sputum production.   Cardiovascular: Denies chest pain, chest tightness, palpitations or swelling in the hands or feet.  Gastrointestinal: Pt reports diarrhea. Denies abdominal pain, bloating, constipation, or blood in the stool.  GU: Pt reports stress incontinence. Denies urgency, frequency, pain with urination, burning sensation, blood in urine, odor or discharge. Musculoskeletal: Pt reports joint pain. Denies decrease in range of motion, difficulty with gait, muscle pain or joint swelling.  Skin: Denies redness, rashes, lesions or ulcercations.  Neurological: Denies dizziness, difficulty with memory, difficulty with speech or problems with balance and coordination.  Psych: Pt reports anxiety. Denies depression, SI/HI.  No other specific complaints in a complete review of systems (except as listed in HPI above).  Objective:  PE:  BP 126/70   Pulse 86   Temp 98.6 F (37 C) (Oral)   Ht 5\' 1"  (1.549 m)   Wt 234 lb (106.1 kg)   BMI 44.21 kg/m   Wt Readings from Last 3 Encounters:  12/02/16 237 lb 3.2 oz (107.6 kg)  11/25/16 234 lb 4.8 oz (106.3 kg)  11/12/16 241 lb 3.2 oz (109.4 kg)    General: Appears her stated age, obese in NAD. Skin: Warm, dry and intact.  HEENT: Head: normal shape and size; Ears: Tm's gray and intact, normal light reflex; Throat/Mouth: Teeth present, mucosa pink and moist, no exudate, lesions or ulcerations noted.  Cardiovascular: Normal rate and rhythm. S1,S2 noted.  No murmur, rubs or gallops noted. 1+ BLE edema. No carotid bruits noted. Pulmonary/Chest: Normal effort and positive vesicular breath sounds. No respiratory distress. No wheezes, rales or ronchi noted.  Abdomen: Soft and nontender. Normal bowel sounds. No distention or masses noted.  Musculoskeletal: Gait slow but steady. Neurological: Alert and  oriented. Psychiatric: She is mildly anxious appearing today.  BMET  Component Value Date/Time   NA 140 11/25/2016 1005   K 4.1 11/25/2016 1005   CL 98 (L) 07/23/2016 1501   CO2 26 11/25/2016 1005   GLUCOSE 119 11/25/2016 1005   BUN 27.9 (H) 11/25/2016 1005   CREATININE 0.9 11/25/2016 1005   CALCIUM 9.8 11/25/2016 1005   GFRNONAA >60 07/29/2016 2029   GFRAA >60 07/29/2016 2029    Lipid Panel     Component Value Date/Time   CHOL 186 04/02/2016 1441   TRIG 234.0 (H) 04/02/2016 1441   HDL 46.90 04/02/2016 1441   CHOLHDL 4 04/02/2016 1441   VLDL 46.8 (H) 04/02/2016 1441   LDLCALC 100 (H) 10/10/2015 1447    CBC    Component Value Date/Time   WBC 21.8 (H) 11/25/2016 1005   WBC 11.9 (H) 07/29/2016 2029   RBC 4.06 11/25/2016 1005   RBC 4.16 07/29/2016 2029   HGB 11.4 (L) 11/25/2016 1005   HCT 36.2 11/25/2016 1005   PLT 262 11/25/2016 1005   MCV 89.1 11/25/2016 1005   MCH 28.1 11/25/2016 1005   MCH 28.6 07/29/2016 2029   MCHC 31.6 11/25/2016 1005   MCHC 31.5 07/29/2016 2029   RDW 15.4 (H) 11/25/2016 1005   LYMPHSABS 2.9 11/25/2016 1005   MONOABS 1.1 (H) 11/25/2016 1005   EOSABS 0.0 11/25/2016 1005   BASOSABS 0.1 11/25/2016 1005    Hgb A1C Lab Results  Component Value Date   HGBA1C 7.8 (H) 04/02/2016      Assessment and Plan:   Medicare Annual Wellness Visit:  Diet: She does eat meat. She consumes fruits and veggies daily. She does eat some fried foods. She drinks flavored water. Physical activity: Sedentary Depression/mood screen: Negative Hearing: Intact to whispered voice Visual acuity: Grossly normal, performs annual eye exam  ADLs: Capable Fall risk: None Home safety: Good Cognitive evaluation: Intact to orientation, naming, recall and repetition EOL planning: Adv directives, full code/ I agree  Preventative Medicine: Flu, tetanus, pneumovax, prevnar and zostavax UTD. Pap smear and mammogram UTD. Colon screening UTD. Bone density UTD. Encouraged  her to consume a balanced diet and exercise regimen. Advised her to see an eye doctor and dentist annually. Will check A1C, and lipid panel today. RX for walker provider for gait disturbance.   Next appointment: 3 months annual exam   [name], NP

## 2016-12-03 NOTE — Patient Instructions (Addendum)

## 2016-12-04 NOTE — Assessment & Plan Note (Signed)
Complicated by diarrhea [id number] today No microalbumin secondary to ARB therapy Encouraged her to consume a low fat, low carb diet and exercise to lose weight Foot exam today Encouraged yearly eye exams Immunizations UTD Consider switching Metformin to Glipizide to see if this helps diarrhea

## 2016-12-04 NOTE — Assessment & Plan Note (Signed)
Lipid profile today Encouraged her to consume a low fat diet Continue statin therapy Add back in Fish Oil

## 2016-12-04 NOTE — Assessment & Plan Note (Signed)
Controlled on Olmesartan-Amlodipine-HCTZ today CMET reviewed

## 2016-12-04 NOTE — Assessment & Plan Note (Signed)
Controlled on Zantac Discussed how weight loss could help improve her reflux CMET reviewed

## 2016-12-04 NOTE — Assessment & Plan Note (Signed)
Continue Zyrtec prn.

## 2016-12-04 NOTE — Assessment & Plan Note (Signed)
Worse due to increased stress, anxiety Metformin may be contributing Continue Lomotil as needed Will switch Metformin to Glipizide to see if the helps Add Zoloft to help stress and anxiety Will monitor

## 2016-12-04 NOTE — Assessment & Plan Note (Signed)
She will continue to wear CPAP Discussed how weight loss could help improve her sleep apnea

## 2016-12-04 NOTE — Assessment & Plan Note (Signed)
She only uses Combivent prn Will monitor

## 2016-12-04 NOTE — Assessment & Plan Note (Signed)
Deteriorated Add Zoloft, eRx sent to pharmacy I do not want her to take Xanax on a regular basis, only as needed Will monitor

## 2016-12-04 NOTE — Assessment & Plan Note (Signed)
Currently undergoing treatment Will continue to follow with oncology

## 2016-12-04 NOTE — Assessment & Plan Note (Signed)
Encouraged her to work on diet and exercise 

## 2016-12-04 NOTE — Assessment & Plan Note (Signed)
Continue Ditropan 

## 2016-12-05 MED ORDER — OLMESARTAN-AMLODIPINE-HCTZ 40-5-25 MG PO TABS: 1.0000 | ORAL_TABLET | Freq: Every day | ORAL | 1 refills | Status: DC

## 2016-12-05 MED ORDER — PRAVASTATIN SODIUM 40 MG PO TABS: 40.0000 mg | ORAL_TABLET | Freq: Every day | ORAL | 1 refills | Status: DC

## 2016-12-05 MED ORDER — GLIPIZIDE 10 MG PO TABS: 10.0000 mg | ORAL_TABLET | Freq: Two times a day (BID) | ORAL | 1 refills | Status: DC

## 2016-12-05 MED ORDER — SERTRALINE HCL 25 MG PO TABS: 25.0000 mg | ORAL_TABLET | Freq: Every day | ORAL | 2 refills | Status: DC

## 2016-12-05 MED ORDER — OXYBUTYNIN CHLORIDE ER 10 MG PO TB24: 10.0000 mg | ORAL_TABLET | Freq: Every day | ORAL | 1 refills | Status: DC

## 2016-12-05 NOTE — Addendum Note (Signed)
Addended by: Lurlean Nanny on: 12/05/2016 01:54 PM   Modules accepted: Orders

## 2016-12-07 DIAGNOSIS — G4733 Obstructive sleep apnea (adult) (pediatric): Secondary | ICD-10-CM | POA: Diagnosis not present

## 2016-12-10 DIAGNOSIS — C773 Secondary and unspecified malignant neoplasm of axilla and upper limb lymph nodes: Secondary | ICD-10-CM | POA: Diagnosis not present

## 2016-12-10 DIAGNOSIS — Z170 Estrogen receptor positive status [ER+]: Secondary | ICD-10-CM | POA: Diagnosis not present

## 2016-12-10 DIAGNOSIS — C50311 Malignant neoplasm of lower-inner quadrant of right female breast: Secondary | ICD-10-CM | POA: Diagnosis not present

## 2016-12-10 DIAGNOSIS — Z510 Encounter for antineoplastic radiation therapy: Secondary | ICD-10-CM | POA: Diagnosis not present

## 2016-12-10 DIAGNOSIS — Z86010 Personal history of colonic polyps: Secondary | ICD-10-CM | POA: Diagnosis not present

## 2016-12-10 DIAGNOSIS — I890 Lymphedema, not elsewhere classified: Secondary | ICD-10-CM | POA: Diagnosis not present

## 2016-12-10 DIAGNOSIS — Z87891 Personal history of nicotine dependence: Secondary | ICD-10-CM | POA: Diagnosis not present

## 2016-12-10 DIAGNOSIS — J45909 Unspecified asthma, uncomplicated: Secondary | ICD-10-CM | POA: Diagnosis not present

## 2016-12-10 DIAGNOSIS — E785 Hyperlipidemia, unspecified: Secondary | ICD-10-CM | POA: Diagnosis not present

## 2016-12-16 ENCOUNTER — Telehealth: Payer: Self-pay | Admitting: Internal Medicine

## 2016-12-16 NOTE — Telephone Encounter (Signed)
[facility] Patient Name: Tiffany Arnold DOB: 1941/06/25 Initial Comment Caller having diarrhea since going through breast cancer treatment Nurse Assessment Nurse: Dimas Chyle, RN, Dellis Filbert Date/Time Eilene Ghazi Time): 12/16/2016 10:40:02 AM Confirm and document reason for call. If symptomatic, describe symptoms. ---Caller having diarrhea since going through breast cancer treatment. Just finished infusions for breast treatment. Finished diarrhea meds a week ago that were prescribed by cancer center. Diarrhea started back this morning. One bout of diarrhea. No fever. Does the patient have any new or worsening symptoms? ---Yes Will a triage be completed? ---Yes Related visit to physician within the last 2 weeks? ---Yes Does the PT have any chronic conditions? (i.e. diabetes, asthma, etc.) ---Yes List chronic conditions. ---Breast cancer. Is this a behavioral health or substance abuse call? ---No Guidelines Guideline Title Affirmed Question Affirmed Notes Cancer - Diarrhea Receiving chemotherapy or radiation therapy Final Disposition User Call PCP within 24 Hours [place], RN, Dellis Filbert Comments Referred caller to oncologis about diarrhea symptoms. Referrals REFERRED TO PCP OFFICE Disagree/Comply: Comply

## 2016-12-17 NOTE — Telephone Encounter (Signed)
I spoke to pt and she reports she is feeling a little better after taking advise from triage to eat more starchy food as she had been on a more liquid diet... Pt is aware to notify us or oncology of changes in Sx or lack of Sx improvement

## 2016-12-17 NOTE — Telephone Encounter (Signed)
Has she made an appt to be seen

## 2016-12-17 NOTE — Telephone Encounter (Signed)
noted 

## 2016-12-19 ENCOUNTER — Encounter: Payer: Self-pay | Admitting: *Deleted

## 2016-12-19 NOTE — Progress Notes (Signed)
[facility] Psychosocial Distress Screening Clinical Social Work  Clinical Social Work was referred by distress screening protocol.  The patient scored a 6 on the Psychosocial Distress Thermometer which indicates moderate distress. Clinical Social Worker phoned pt to assess for distress and other psychosocial needs. Pt very open to talking with CSW. She reports her sister is a good support. She reports to take anti-anxiety medications and they work well for her. She gets anxious when home alone. She reports to have good support from family. CSW shared programs available through the Support Team for additional support. Pt aware to reach out during treatment as needed for additional support.   ONCBCN DISTRESS SCREENING 12/02/2016  Screening Type Initial Screening  Distress experienced in past week (1-10) 6  Family Problem type Partner;Children  Emotional problem type Depression;Nervousness/Anxiety;Adjusting to illness;Isolation/feeling alone;Adjusting to appearance changes  Spiritual/Religous concerns type Relating to God;Loss of sense of purpose  Information Concerns Type Lack of info about diagnosis;Lack of info about treatment;Lack of info about maintaining fitness  Physical Problem type Getting around;Mouth sores/swallowing;Constipation/diarrhea;Changes in urination;Skin dry/itchy;Swollen arms/legs    Clinical Social Worker follow up needed: No.  If yes, follow up plan:  Loren Racer, [place]  [facility] Phone: [phone] Fax: [phone]

## 2016-12-19 NOTE — Progress Notes (Signed)
[facility]  [phone] [phone]  Clinic Follow Up Note   Patient Care Team: [name], NP as PCP - General (Internal Medicine) 12/24/2016   CHIEF COMPLAINTS:  Follow Up right breast cancer  . Oncology History   Breast cancer of lower-inner quadrant of right female breast [facility])   Staging form: Breast, AJCC 7th Edition   - Clinical stage from 06/09/2016: Stage IIB (T2, N1, M0) - Signed by Truitt Merle, MD on 06/27/2016   - Pathologic stage from 07/29/2016: Stage IIA (T1c, N1a, cM0) - Signed by Truitt Merle, MD on 08/13/2016       Breast cancer of lower-inner quadrant of right female breast ([place])   05/23/2016 Mammogram    Diagnostic mammogram and ultrasound showed suspicious architectural distortion within the right breast lower inner quadrant, measuring 1.3 cm, without sonographic corelate.      06/03/2016 Initial Biopsy    Right breast inferior lower quadrant core needle biopsy showed atypical ductal hyperplasia with calcifications      06/09/2016 Receptors her2    Breast biopsy showed ER 100% positive, PR 100% positive, HER-2 negative, Ki-67 40%      06/09/2016 Initial Diagnosis    Breast cancer of lower-inner quadrant of right female breast ([place])      06/09/2016 Initial Biopsy    Right breast inner quadrant core needle biopsy showed invasive ductal carcinoma and DCIS, grade 1-2      06/17/2016 Initial Biopsy    Right axillary lymph node core needle biopsy showed metastatic carcinoma      06/17/2016 Receptors her2    Axillary node biopsy showed ER 100% positive, PR 95% positive, HER-2 negative      06/19/2016 Imaging    Bilateral breast MRI showed locations in the lower inner right breast, largest 2.7X1.3X1.6cm, biopsy clips in 2 of this areas. There are abnormal right axillary lymph nodes showing second cortical's, no evidence of malignancy in the left breast.      07/29/2016 Surgery    Right lumpectomy and ALND      07/29/2016  Pathology Results    Right lumpectomy showed G3 IDC, DCIS, margins (-), LVI(-).  1 of 16 nodes was positive       07/29/2016 Miscellaneous    Mammaprint showed high risk disease, luminal type B      09/09/2016 -  Adjuvant Chemotherapy    Docetaxel and Cytoxan (TC) every 3 weeks      12/24/2016 -  Radiation Therapy    Adjuvant breast radiaiton       HISTORY OF PRESENTING ILLNESS:  Tiffany Arnold 75 y.o. female is here because of her recently diagnosed left breast cancer. She presents to my clinic with her friend, who is a Marine scientist.   Her cancer was discovered by screening mammogram. She had a right breast cyst in 2001, which was removed. She has been doing mammogram once a year. The mammogram and ultrasound on 05/23/2016 showed a suspicious architectural this portion, 1.3 cm, without sonographic correlation. She underwent core needle biopsy of the right breast mass twice and right axilla node biopsy, one breast biopsy and node biopsy showed invasive ductal carcinoma and DCIS, ER/PR strong positive, HER-2 negative.  She denies any other new symptoms. She has noticed mild fatigued lately, she still works full time in a vet's office, she has IBS, has intermittent constipation and diarrhea. She has arthritis, and both knee replacement and shoulder surgery before, she also has some back pain lately, she takes  tylenol occasionally.  She lives with her husband, moderately active. No family history of breast cancer  GYN HISTORY  Menarchal: 13 LMP: 33 Contraceptive: 4-5 years HRT: 3 years  G2P2: no breast feeding, daughter 28 yo and son is 74 yo.   CURRENT THERAPY: Adjuvant Breast radiation  INTERIM HISTORY: Tiffany Arnold returns for follow up. She was late to our appointment because she started radiation treatment today. She is feeling okay. She reports pretty severe loose stool which seems to be getting thicker. She has episodes of bowel incontinence where she is unable to get to the bathroom in  time, this has happened a couple times. She has had irritable bowel symptoms before but it seems that treatment made it worse. She delivered two children. She states when she takes imodium it does not work as well as it should. She was previously prescribed lomotil but does not have any tablets left. This medication did help manage her bowels. She alternates from constipation to diarrhea. She denies peripheral neuropathy symptoms. Her appetite has not fully returned. She reports fatigue. She believes she is scheduled for 7 weeks radiation therapy. Her port will be removed after she finishes radiation therapy. Her port has not been flushed since her last chemotherapy treatment.    MEDICAL HISTORY:  Past Medical History:  Diagnosis Date  . Allergy   . Arthritis   . Asthma   . Cancer ([place]) 07/29/2016   right breast  . Cataract of both eyes   . Chicken pox   . Colon polyps   . Dog bite of index finger 07/23/2016   Left index finger  . GERD (gastroesophageal reflux disease)   . Hyperlipidemia   . Hypertension   . IBS (irritable bowel syndrome)   . Phlebitis   . Pneumonia    hx of several yrs ago  . Shortness of breath dyspnea    with exertion only  . Sleep apnea    wears CPAP machine nightly    SURGICAL HISTORY: Past Surgical History:  Procedure Laterality Date  . BACK SURGERY     lower  . BREAST CYST EXCISION Right 2001   negative  . BREAST LUMPECTOMY WITH NEEDLE LOCALIZATION AND AXILLARY LYMPH NODE DISSECTION Right 07/29/2016   Procedure: RIGHT BREAST LUMPECTOMY WITH DOUBLE NEEDLE LOCALIZATION AND COMPLETE RIGHT AXILLARY LYMPH NODE DISSECTION;  Surgeon: Fanny Skates, MD;  Location: [facility];  Service: General;  Laterality: Right;  . BREAST SURGERY Left 2000   Biopsy  . BUNIONECTOMY Bilateral 1998   great toe fusion on right foot  . COLONOSCOPY W/ POLYPECTOMY    . HERNIA REPAIR  [id number]   [place] SINUS SURGERY  2015  . PORTACATH PLACEMENT N/A 09/05/2016   Procedure:  INSERTION PORT-A-CATH;  Surgeon: Fanny Skates, MD;  Location: [facility];  Service: General;  Laterality: N/A;  . REPLACEMENT TOTAL KNEE Bilateral 2007  . TOTAL SHOULDER REPLACEMENT Left 2007    SOCIAL HISTORY: Social History   Social History  . Marital status: Married    Spouse name: N/A  . Number of children: N/A  . Years of education: N/A   Occupational History  . Not on file.   Social History Main Topics  . Smoking status: Former Smoker    Packs/day: 2.00    Years: 25.00    Quit date: 12/22/1990  . Smokeless tobacco: Never Used     Comment: quit 24 years ago  . Alcohol use Yes     Comment: rare  .  Drug use: No  . Sexual activity: Not Currently   Other Topics Concern  . Not on file   Social History Narrative  . No narrative on file    FAMILY HISTORY: Family History  Problem Relation Age of Onset  . Arthritis Mother   . Stroke Mother   . Hypertension Mother   . Cancer Father     Prostate  . Stroke Father   . Hypertension Father   . Hypertension Maternal Grandmother   . Rheum arthritis Maternal Grandfather   . Stroke Maternal Grandfather   . Hypertension Maternal Grandfather   . Cancer Paternal Grandmother     Colon  . Hypertension Paternal Grandmother   . Hypertension Paternal Grandfather   . Breast cancer Neg Hx     ALLERGIES:  is allergic to other.  MEDICATIONS:  Current Outpatient Prescriptions  Medication Sig Dispense Refill  . albuterol-ipratropium (COMBIVENT) 18-103 MCG/ACT inhaler Inhale 2 puffs into the lungs every 4 (four) hours as needed for wheezing or shortness of breath.     . cetirizine (ZYRTEC) 10 MG tablet Take 10 mg by mouth daily.    . diphenoxylate-atropine (LOMOTIL) 2.5-0.025 MG tablet Take 2 tablets by mouth 4 (four) times daily as needed for diarrhea or loose stools. 30 tablet 0  . glipiZIDE (GLUCOTROL) 10 MG tablet Take 1 tablet (10 mg total) by mouth 2 (two) times daily before a meal. 180 tablet 1  . glucosamine-chondroitin  500-400 MG tablet Take 1 tablet by mouth 2 (two) times daily.    Marland Kitchen lidocaine-prilocaine (EMLA) cream Apply to port at least 1 hour prior to chemo 30 g 1  . Olmesartan-Amlodipine-HCTZ 40-5-25 MG TABS Take 1 tablet by mouth daily. 90 tablet 1  . oxybutynin (DITROPAN-XL) 10 MG 24 hr tablet Take 1 tablet (10 mg total) by mouth at bedtime. 90 tablet 1  . pravastatin (PRAVACHOL) 40 MG tablet Take 1 tablet (40 mg total) by mouth daily. 90 tablet 1  . ranitidine (ZANTAC) 150 MG tablet Take 150 mg by mouth every evening.     . sertraline (ZOLOFT) 25 MG tablet Take 1 tablet (25 mg total) by mouth daily. 30 tablet 2  . vitamin B-12 (CYANOCOBALAMIN) 1000 MCG tablet Take 1,000 mcg by mouth daily.    Marland Kitchen ALPRAZolam (XANAX) 0.25 MG tablet TAKE ONE TABLET BY MOUTH TWICE DAILY AS NEEDED (Patient not taking: Reported on 12/24/2016) 60 tablet 0  . Calcium Carbonate-Vitamin D (CALCIUM-VITAMIN D) 500-200 MG-UNIT tablet Take 1 tablet by mouth daily.    Marland Kitchen dexamethasone (DECADRON) 4 MG tablet TAKE ONE TABLET BY MOUTH TWICE DAILY *START  THE  DAY  BEFORE  TAXOTERE,  THEN  AGAIN  THE  DAY  AFTER  CHEMO  FOR  3  DAYS* (Patient not taking: Reported on 12/24/2016) 30 tablet 1  . EPINEPHrine 0.3 mg/0.3 mL IJ SOAJ injection Inject 0.3 mg into the muscle once as needed (ALLERGIC REACTION).     Marland Kitchen HYDROcodone-acetaminophen (NORCO/VICODIN) 5-325 MG tablet Take 1-2 tablets by mouth every 4 (four) hours as needed. (Patient not taking: Reported on 12/24/2016) 50 tablet 0  . magic mouthwash SOLN Swish and Spit 5-10 mLs 4 times a day as needed. (Patient not taking: Reported on 12/24/2016) 240 mL 1  . Omega-3 Fatty Acids (FISH OIL) 1000 MG CAPS Take 1,000 mg by mouth daily.    . ondansetron (ZOFRAN) 8 MG tablet TAKE ONE TABLET BY MOUTH TWICE DAILY AS NEEDED FOR  REFRACTORY  NAUSEA/VOMITING.  START  ON  DAY  3  AFTER  CHEMO. (Patient not taking: Reported on 12/24/2016) 30 tablet 1  . prochlorperazine (COMPAZINE) 10 MG tablet TAKE ONE TABLET BY MOUTH  EVERY 6 HOURS AS NEEDED FOR NAUSEA AND VOMITING (Patient not taking: Reported on 12/24/2016) 30 tablet 1   No current facility-administered medications for this visit.     REVIEW OF SYSTEMS:   Constitutional: Denies fevers, chills or abnormal night sweats (+) fatigue Eyes: Denies blurriness of vision, double vision or watery eyes Ears, nose, mouth, throat, and face: Denies mucositis or sore throat Respiratory: Denies cough, dyspnea or wheezes Cardiovascular: Denies palpitation, chest discomfort or lower extremity swelling Gastrointestinal:  Denies nausea, heartburn or change in bowel habits (+) loose stool and intermittent bowel incontinence  Skin: Denies abnormal skin rashes Lymphatics: Denies new lymphadenopathy or easy bruising Neurological:Denies numbness, tingling or new weaknesses Behavioral/Psych: Mood is stable, no new changes  All other systems were reviewed with the patient and are negative.  PHYSICAL EXAMINATION: ECOG PERFORMANCE STATUS: 1  Vitals:   12/24/16 1225  BP: (!) 124/45  Pulse: 80  Resp: 18  Temp: 98.1 F (36.7 C)   Filed Weights   12/24/16 1225  Weight: 220 lb (99.8 kg)    GENERAL:alert, no distress and comfortable SKIN: skin color, texture, turgor are normal, no rashes or significant lesions EYES: normal, conjunctiva are pink and non-injected, sclera clear OROPHARYNX:no exudate, no erythema and lips, buccal mucosa, and tongue normal  NECK: supple, thyroid normal size, non-tender, without nodularity LYMPH:  no palpable lymphadenopathy in the cervical, axillary or inguinal LUNGS: clear to auscultation and percussion with normal breathing effort HEART: regular rate & rhythm and no murmurs and no lower extremity edema ABDOMEN:abdomen soft, non-tender and normal bowel sounds Musculoskeletal:no cyanosis of digits and no clubbing  PSYCH: alert & oriented x 3 with fluent speech NEURO: no focal motor/sensory deficits   LABORATORY DATA:  I have reviewed the  data as listed CBC Latest Ref Rng & Units 12/24/2016 11/25/2016 11/12/2016  WBC 3.9 - 10.3 10e3/uL 10.4(H) 21.8(H) 9.3  Hemoglobin 11.6 - 15.9 g/dL 12.3 11.4(L) 11.0(L)  Hematocrit 34.8 - 46.6 % 38.5 36.2 33.8(L)  Platelets 145 - 400 10e3/uL 346 262 329   CMP Latest Ref Rng & Units 12/24/2016 11/25/2016 11/12/2016  Glucose 70 - 140 mg/dl 109 119 185(H)  BUN 7.0 - 26.0 mg/dL 12.5 27.9(H) 28.4(H)  Creatinine 0.6 - 1.1 mg/dL 0.8 0.9 0.8  Sodium 136 - 145 mEq/L 137 140 139  Potassium 3.5 - 5.1 mEq/L 4.1 4.1 4.0  Chloride 101 - 111 mmol/L - - -  CO2 22 - 29 mEq/L 28 26 26   Calcium 8.4 - 10.4 mg/dL 11.1(H) 9.8 9.9  Total Protein 6.4 - 8.3 g/dL 7.1 5.8(L) 6.3(L)  Total Bilirubin 0.20 - 1.20 mg/dL 0.64 0.31 0.30  Alkaline Phos 40 - 150 U/L 73 104 64  AST 5 - 34 U/L 21 12 12   ALT 0 - 55 U/L 19 18 17     PATHOLOGY REPORT  Diagnosis 06/03/2016 Breast, right, needle core biopsy, ILQ focal 1.3 cm asymmetry/distortion - ATYPICAL DUCTAL HYPERPLASIA WITH CALCIFICATIONS. - FIBROCYSTIC CHANGES WITH CALCIFICATIONS. - SEE COMMENT. Microscopic Comment The results were called to [facility] on 06/04/16. (JBK:ds 06/04/16)   Diagnosis 06/09/2016 Breast, right, needle core biopsy, inner - INVASIVE DUCTAL CARCINOMA. - DUCTAL CARCINOMA IN SITU. - SEE COMMENT. Microscopic Comment The carcinoma appears grade 1-2. A breast prognostic profile will be performed and the results reported separately. The  results were called to [facility] on 06/10/2016. (JBK:ecj 06/10/2016) Results: HER2 - NEGATIVE RATIO OF HER2/CEP17 SIGNALS 1.55 AVERAGE HER2 COPY NUMBER PER CELL 2.25  Results: IMMUNOHISTOCHEMICAL AND MORPHOMETRIC ANALYSIS PERFORMED MANUALLY Estrogen Receptor: 100%, POSITIVE, STRONG STAINING INTENSITY Progesterone Receptor: 100%, POSITIVE, STRONG STAINING INTENSITY Proliferation Marker Ki67: 40%   Diagnosis 06/17/2016 Lymph node, needle/core biopsy, right, inferior,  axilla to far lateral breast - METASTATIC CARCINOMA, SEE COMMENT. Microscopic Comment The morphology is consistent with the patient breast carcinoma. Prognostic markers will be ordered and reported in an addendum. The case was called to [facility] on 06/18/2016. Results: IMMUNOHISTOCHEMICAL AND MORPHOMETRIC ANALYSIS PERFORMED MANUALLY Estrogen Receptor: 100%, POSITIVE, STRONG STAINING INTENSITY Progesterone Receptor: 95%, POSITIVE, STRONG STAINING INTENSITY  Results: HER2 - NEGATIVE RATIO OF HER2/CEP17 SIGNALS 1.25 AVERAGE HER2 COPY NUMBER PER CELL 2.00  Diagnosis 07/29/2016 1. Breast, lumpectomy, Right INVASIVE DUCTAL CARCINOMA, GRADE 3, SPANNING 1.5 CM DUCTAL CARCINOMA IN SITU IS PRESENT ALL MARGINS OF RESECTION ARE NEGATIVE FOR CARCINOMA 2. Lymph nodes, regional resection, Right axillary contents METASTATIC BREAST DUCTAL CARCINOMA IN ONE OF SIXTEEN LYMPH NODES (1/16) Specimen, including laterality and lymph node sampling (sentinel, non-sentinel): Right partial breast and regional lymph nodes Procedure: Lumpectomy Histologic type: Ductal carcinoma Grade: 3 Tubule formation: 3 Nuclear pleomorphism: 2 Mitotic:3 Tumor size (gross measurement or glass slide measurement): 1.5 cm Margins: Invasive, distance to closest margin: 0.7 cm In-situ, distance to closest margin: 0.7 cm If margin positive, focally or broadly: NA Lymphovascular invasion: Not identified Ductal carcinoma in situ: Present Grade: 3 Extensive intraductal component: moderate Lobular neoplasia: Negative Tumor focality: Focal Treatment effect: Negative If present, treatment effect in breast tissue, lymph nodes or both: NA Extent of tumor: Skin: Negative Nipple: Negative Skeletal muscle: Negative Lymph nodes: Examined: 0 Sentinel 16 Non-sentinel 16 Total Lymph nodes with metastasis: 1 Isolated tumor cells (< 0.2 mm): 0 Micrometastasis: (> 0.2 mm and < 2.0 mm): 0 Macrometastasis: (> 2.0  mm): 1 Extracapsular extension: Present Breast prognostic profile: Estrogen receptor: 100% Progesterone receptor: 100% Her 2 neu: Negative Ki-67: 40% Non-neoplastic breast: Unremarkable TNM: pT1c, pN1  RADIOGRAPHIC STUDIES: I have personally reviewed the radiological images as listed and agreed with the findings in the report. No results found. Diagnostic mammogram and ultrasound of right breast including right axillary 05/23/2016 IMPRESSION: Suspicious architectural distortion within the lower inner quadrant of the right breast, measuring 1.3 cm greatest dimension, without sonographic correlate. This distortion could conceivably be related to the patient's earlier surgical excision biopsy perform in 2001 (patient states that this earlier surgical excision was in this same region of her right breast), however, exclusion of a neoplastic cause is needed. As such, stereotactic-guided biopsy, with 3D tomosynthesis, is recommended for this suspicious finding.  US Venous Img Upper Uni Right 10/09/2016 IMPRESSION: No evidence of DVT within the right upper extremity.  ASSESSMENT & PLAN: 75 y.o.Caucasian female, with mammogram discovered right breast cancer.  1. 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  -We previously reviewed her mammaprint genomic test result, which showed luminal type B, high risk, average 10-year risk of recurrence without adjuvant therapy is 29% -given the high risk disease, I strongly recommend her to consider adjuvant chemo. She is relatively elderly,  with some medical comorbilities, but still has good PS, so I recommend adjuvant docetaxel and cytoxan every 3 weeks for 4 cycles   -the  goal of chemo is curative  -She has completed adjuvant chemotherapy -lab reviewed. She is clinically doing well, still has residual fatigue from chemotherapy. -She started radiation treatment today --Given the strong ER and PR positivity, I do recommend adjuvant aromatase inhibitor to reduce her risk of cancer recurrence,  The potential benefit and side effects, which includes but not limited to, hot flash, skin and vaginal dryness, metabolic changes ( increased blood glucose, cholesterol, weight, etc.), slightly in increased risk of cardiovascular disease, cataracts, muscular and joint discomfort, osteopenia and osteoporosis, etc, were discussed with her in great details. She is interested, and we'll start after she completes radiation. She does have moderate arthralgia, if she has worsening arthralgia was at aromatase inhibitor, I'll switch her to tamoxifen. -We discussed her breast cancer surveillance. She will continue continue screening mammogram, self exam, and routine follow-up with [place] for labs and exam. -I encouraged her to have healthy diet and exercise regularly, and try to lose some weight after she completes radiation  2. Hyperglycemia -She had borderline hyperglycemia before. Her random glucose has been 180-200, this is likely steroids induced hyperglycemia. -I strongly encouraged her follow-up with her primary care physician. -Her blood glucose has improved after she completed chemotherapy   3. Right upper extremity lymphedema -She is quite significant right upper extremity lymphedema after the axillary node dissection -Continue physical therapy, and wear sleeves   4. Hypertension and arthritis -She will continue medication and follow-up with her primary care physician -We previously discussed her that chemotherapy may affect her oral intake and her blood pressure, she is on 3 blood pressure medications including HCTZ, I strongly encouraged her to monitor her blood pressure at home,  and we may need to adjust her medication if her blood pressure drops.  5. Morbid obesity -I encouraged her to have healthy diet and exercise regularly, try to lose some weight.  6. Loose stool, IBS, and bowel incontinence -I encouraged the patient to begin taking probiotics and imodium to manage her bowel symptoms -I also encouraged her to try pelvic exercises for incontinence -She does not follow with GI for IBS. I encouraged her to follow with GI if this worsens   Plan -She began radiation treatment today -She will be scheduled for port flush later this week, she plans to remove the port in February after she completes radiation. -Return to clinic when she completes RT, to start adjuvant AI    All questions were answered. The patient knows to call the clinic with any problems, questions or concerns. I spent 20 minutes counseling the patient face to face. The total time spent in the appointment was 25 minutes and more than 50% was on counseling.  This document serves as a record of services personally performed by Truitt Merle, MD. It was created on her behalf by Arlyce Harman, a trained medical scribe. The creation of this record is based on the scribe's personal observations and the provider's statements to them. This document has been checked and approved by the attending provider.    Truitt Merle, MD 12/24/2016

## 2016-12-23 ENCOUNTER — Ambulatory Visit
Admission: RE | Admit: 2016-12-23 | Discharge: 2016-12-23 | Disposition: A | Discharge status: Home / Self Care | Payer: Medicare PPO | Source: Ambulatory Visit | Attending: Radiation Oncology | Admitting: Radiation Oncology

## 2016-12-23 DIAGNOSIS — J45909 Unspecified asthma, uncomplicated: Secondary | ICD-10-CM | POA: Diagnosis not present

## 2016-12-23 DIAGNOSIS — E785 Hyperlipidemia, unspecified: Secondary | ICD-10-CM | POA: Diagnosis not present

## 2016-12-23 DIAGNOSIS — C773 Secondary and unspecified malignant neoplasm of axilla and upper limb lymph nodes: Secondary | ICD-10-CM | POA: Diagnosis not present

## 2016-12-23 DIAGNOSIS — Z170 Estrogen receptor positive status [ER+]: Secondary | ICD-10-CM | POA: Diagnosis not present

## 2016-12-23 DIAGNOSIS — C50311 Malignant neoplasm of lower-inner quadrant of right female breast: Secondary | ICD-10-CM | POA: Diagnosis not present

## 2016-12-23 DIAGNOSIS — Z86010 Personal history of colonic polyps: Secondary | ICD-10-CM | POA: Diagnosis not present

## 2016-12-23 DIAGNOSIS — Z87891 Personal history of nicotine dependence: Secondary | ICD-10-CM | POA: Diagnosis not present

## 2016-12-23 DIAGNOSIS — Z510 Encounter for antineoplastic radiation therapy: Secondary | ICD-10-CM | POA: Diagnosis not present

## 2016-12-23 DIAGNOSIS — I890 Lymphedema, not elsewhere classified: Secondary | ICD-10-CM | POA: Diagnosis not present

## 2016-12-24 ENCOUNTER — Telehealth: Payer: Self-pay | Admitting: Hematology

## 2016-12-24 ENCOUNTER — Encounter: Payer: Self-pay | Admitting: Hematology

## 2016-12-24 ENCOUNTER — Ambulatory Visit
Admission: RE | Admit: 2016-12-24 | Discharge: 2016-12-24 | Disposition: A | Discharge status: Home / Self Care | Payer: Medicare PPO | Source: Ambulatory Visit | Attending: Radiation Oncology | Admitting: Radiation Oncology

## 2016-12-24 ENCOUNTER — Other Ambulatory Visit (HOSPITAL_BASED_OUTPATIENT_CLINIC_OR_DEPARTMENT_OTHER): Payer: Medicare PPO

## 2016-12-24 ENCOUNTER — Ambulatory Visit (HOSPITAL_BASED_OUTPATIENT_CLINIC_OR_DEPARTMENT_OTHER): Payer: Medicare PPO | Admitting: Hematology

## 2016-12-24 VITALS — BP 124/45 | HR 80 | Temp 98.1°F | Resp 18 | Ht 61.0 in | Wt 220.0 lb

## 2016-12-24 DIAGNOSIS — Z170 Estrogen receptor positive status [ER+]: Principal | ICD-10-CM

## 2016-12-24 DIAGNOSIS — I10 Essential (primary) hypertension: Secondary | ICD-10-CM | POA: Diagnosis not present

## 2016-12-24 DIAGNOSIS — J45909 Unspecified asthma, uncomplicated: Secondary | ICD-10-CM | POA: Diagnosis not present

## 2016-12-24 DIAGNOSIS — Z87891 Personal history of nicotine dependence: Secondary | ICD-10-CM | POA: Diagnosis not present

## 2016-12-24 DIAGNOSIS — C773 Secondary and unspecified malignant neoplasm of axilla and upper limb lymph nodes: Secondary | ICD-10-CM | POA: Diagnosis not present

## 2016-12-24 DIAGNOSIS — E119 Type 2 diabetes mellitus without complications: Secondary | ICD-10-CM | POA: Diagnosis not present

## 2016-12-24 DIAGNOSIS — C50311 Malignant neoplasm of lower-inner quadrant of right female breast: Secondary | ICD-10-CM

## 2016-12-24 DIAGNOSIS — G4733 Obstructive sleep apnea (adult) (pediatric): Secondary | ICD-10-CM

## 2016-12-24 DIAGNOSIS — Z86010 Personal history of colonic polyps: Secondary | ICD-10-CM | POA: Diagnosis not present

## 2016-12-24 DIAGNOSIS — Z9989 Dependence on other enabling machines and devices: Secondary | ICD-10-CM

## 2016-12-24 DIAGNOSIS — Z510 Encounter for antineoplastic radiation therapy: Secondary | ICD-10-CM | POA: Diagnosis not present

## 2016-12-24 DIAGNOSIS — E785 Hyperlipidemia, unspecified: Secondary | ICD-10-CM | POA: Diagnosis not present

## 2016-12-24 DIAGNOSIS — I890 Lymphedema, not elsewhere classified: Secondary | ICD-10-CM | POA: Diagnosis not present

## 2016-12-24 LAB — CBC WITH DIFFERENTIAL/PLATELET
BASO%: 0.3 % (ref 0.0–2.0)
Basophils Absolute: 0 10*3/uL (ref 0.0–0.1)
EOS ABS: 0.1 10*3/uL (ref 0.0–0.5)
EOS%: 0.6 % (ref 0.0–7.0)
HEMATOCRIT: 38.5 % (ref 34.8–46.6)
HEMOGLOBIN: 12.3 g/dL (ref 11.6–15.9)
LYMPH#: 2.2 10*3/uL (ref 0.9–3.3)
LYMPH%: 21.4 % (ref 14.0–49.7)
MCH: 28.3 pg (ref 25.1–34.0)
MCHC: 31.9 g/dL (ref 31.5–36.0)
MCV: 88.7 fL (ref 79.5–101.0)
MONO#: 0.6 10*3/uL (ref 0.1–0.9)
MONO%: 5.8 % (ref 0.0–14.0)
NEUT#: 7.5 10*3/uL — ABNORMAL HIGH (ref 1.5–6.5)
NEUT%: 71.9 % (ref 38.4–76.8)
Platelets: 346 10*3/uL (ref 145–400)
RBC: 4.34 10*6/uL (ref 3.70–5.45)
RDW: 14.9 % — ABNORMAL HIGH (ref 11.2–14.5)
WBC: 10.4 10*3/uL — ABNORMAL HIGH (ref 3.9–10.3)

## 2016-12-24 LAB — COMPREHENSIVE METABOLIC PANEL
ALBUMIN: 3.9 g/dL (ref 3.5–5.0)
ALK PHOS: 73 U/L (ref 40–150)
ALT: 19 U/L (ref 0–55)
ANION GAP: 12 meq/L — AB (ref 3–11)
AST: 21 U/L (ref 5–34)
BUN: 12.5 mg/dL (ref 7.0–26.0)
CALCIUM: 11.1 mg/dL — AB (ref 8.4–10.4)
CO2: 28 mEq/L (ref 22–29)
Chloride: 96 mEq/L — ABNORMAL LOW (ref 98–109)
Creatinine: 0.8 mg/dL (ref 0.6–1.1)
EGFR: 75 mL/min/{1.73_m2} — AB (ref >90)
Glucose: 109 mg/dl (ref 70–140)
POTASSIUM: 4.1 meq/L (ref 3.5–5.1)
Sodium: 137 mEq/L (ref 136–145)
Total Bilirubin: 0.64 mg/dL (ref 0.20–1.20)
Total Protein: 7.1 g/dL (ref 6.4–8.3)

## 2016-12-24 MED ORDER — ALRA NON-METALLIC DEODORANT (RAD-ONC)
1.0000 "application " | Freq: Once | TOPICAL | Status: AC
  Administered 2016-12-24: 1 via TOPICAL

## 2016-12-24 MED ORDER — RADIAPLEXRX EX GEL
Freq: Once | CUTANEOUS | Status: AC
  Administered 2016-12-24: 12:00:00 via TOPICAL

## 2016-12-24 NOTE — Telephone Encounter (Signed)
Called patient in regards to flush appointment for 1/4.

## 2016-12-24 NOTE — Progress Notes (Signed)

## 2016-12-24 NOTE — Telephone Encounter (Signed)
Appointments scheduled per 1/3 LOS. Patient given AVS report and calendars with future scheduled appointments. °

## 2016-12-25 ENCOUNTER — Ambulatory Visit
Admission: RE | Admit: 2016-12-25 | Discharge: 2016-12-25 | Disposition: A | Discharge status: Home / Self Care | Payer: Medicare PPO | Source: Ambulatory Visit | Attending: Radiation Oncology | Admitting: Radiation Oncology

## 2016-12-25 ENCOUNTER — Ambulatory Visit (HOSPITAL_BASED_OUTPATIENT_CLINIC_OR_DEPARTMENT_OTHER): Payer: Medicare PPO

## 2016-12-25 ENCOUNTER — Telehealth: Payer: Self-pay

## 2016-12-25 DIAGNOSIS — Z452 Encounter for adjustment and management of vascular access device: Secondary | ICD-10-CM

## 2016-12-25 DIAGNOSIS — Z170 Estrogen receptor positive status [ER+]: Secondary | ICD-10-CM | POA: Diagnosis not present

## 2016-12-25 DIAGNOSIS — C773 Secondary and unspecified malignant neoplasm of axilla and upper limb lymph nodes: Secondary | ICD-10-CM | POA: Diagnosis not present

## 2016-12-25 DIAGNOSIS — Z95828 Presence of other vascular implants and grafts: Secondary | ICD-10-CM | POA: Insufficient documentation

## 2016-12-25 DIAGNOSIS — Z86010 Personal history of colonic polyps: Secondary | ICD-10-CM | POA: Diagnosis not present

## 2016-12-25 DIAGNOSIS — C50311 Malignant neoplasm of lower-inner quadrant of right female breast: Secondary | ICD-10-CM

## 2016-12-25 DIAGNOSIS — E785 Hyperlipidemia, unspecified: Secondary | ICD-10-CM | POA: Diagnosis not present

## 2016-12-25 DIAGNOSIS — R269 Unspecified abnormalities of gait and mobility: Secondary | ICD-10-CM | POA: Diagnosis not present

## 2016-12-25 DIAGNOSIS — J45909 Unspecified asthma, uncomplicated: Secondary | ICD-10-CM | POA: Diagnosis not present

## 2016-12-25 DIAGNOSIS — Z510 Encounter for antineoplastic radiation therapy: Secondary | ICD-10-CM | POA: Diagnosis not present

## 2016-12-25 DIAGNOSIS — I890 Lymphedema, not elsewhere classified: Secondary | ICD-10-CM | POA: Diagnosis not present

## 2016-12-25 DIAGNOSIS — Z87891 Personal history of nicotine dependence: Secondary | ICD-10-CM | POA: Diagnosis not present

## 2016-12-25 MED ORDER — HEPARIN SOD (PORK) LOCK FLUSH 100 UNIT/ML IV SOLN
500.0000 [IU] | Freq: Once | INTRAVENOUS | Status: AC | PRN
  Administered 2016-12-25: 500 [IU] via INTRAVENOUS
  Filled 2016-12-25: qty 5

## 2016-12-25 MED ORDER — SODIUM CHLORIDE 0.9% FLUSH
10.0000 mL | INTRAVENOUS | Status: DC | PRN
  Administered 2016-12-25: 10 mL via INTRAVENOUS
  Filled 2016-12-25: qty 10

## 2016-12-25 NOTE — Addendum Note (Signed)
Addended by: [name] on: 12/25/2016 03:55 PM   Modules accepted: Orders

## 2016-12-25 NOTE — Telephone Encounter (Signed)
Pt called is requesting a new prescription for a walker ASAP she said the rx that was given to her was wrong and she is needing a new one for a 4 wheel rolling walker with a seat. Please [facility] to [facility] at [phone].

## 2016-12-25 NOTE — Telephone Encounter (Signed)
RX printed and signed and placed in MYD box 

## 2016-12-26 ENCOUNTER — Ambulatory Visit
Admission: RE | Admit: 2016-12-26 | Discharge: 2016-12-26 | Disposition: A | Discharge status: Home / Self Care | Payer: Medicare PPO | Source: Ambulatory Visit | Attending: Radiation Oncology | Admitting: Radiation Oncology

## 2016-12-26 DIAGNOSIS — Z87891 Personal history of nicotine dependence: Secondary | ICD-10-CM | POA: Diagnosis not present

## 2016-12-26 DIAGNOSIS — C50311 Malignant neoplasm of lower-inner quadrant of right female breast: Secondary | ICD-10-CM | POA: Diagnosis not present

## 2016-12-26 DIAGNOSIS — Z86010 Personal history of colonic polyps: Secondary | ICD-10-CM | POA: Diagnosis not present

## 2016-12-26 DIAGNOSIS — J45909 Unspecified asthma, uncomplicated: Secondary | ICD-10-CM | POA: Diagnosis not present

## 2016-12-26 DIAGNOSIS — E785 Hyperlipidemia, unspecified: Secondary | ICD-10-CM | POA: Diagnosis not present

## 2016-12-26 DIAGNOSIS — C773 Secondary and unspecified malignant neoplasm of axilla and upper limb lymph nodes: Secondary | ICD-10-CM | POA: Diagnosis not present

## 2016-12-26 DIAGNOSIS — Z510 Encounter for antineoplastic radiation therapy: Secondary | ICD-10-CM | POA: Diagnosis not present

## 2016-12-26 DIAGNOSIS — I890 Lymphedema, not elsewhere classified: Secondary | ICD-10-CM | POA: Diagnosis not present

## 2016-12-26 DIAGNOSIS — Z170 Estrogen receptor positive status [ER+]: Secondary | ICD-10-CM | POA: Diagnosis not present

## 2016-12-26 NOTE — Telephone Encounter (Signed)
Order faxed to [facility] as instructed

## 2016-12-29 ENCOUNTER — Ambulatory Visit
Admission: RE | Admit: 2016-12-29 | Discharge: 2016-12-29 | Disposition: A | Discharge status: Home / Self Care | Payer: Medicare PPO | Source: Ambulatory Visit | Attending: Radiation Oncology | Admitting: Radiation Oncology

## 2016-12-29 ENCOUNTER — Encounter: Payer: Self-pay | Admitting: Radiation Oncology

## 2016-12-29 VITALS — BP 105/46 | HR 76 | Temp 98.7°F | Ht 61.0 in | Wt 216.2 lb

## 2016-12-29 DIAGNOSIS — Z170 Estrogen receptor positive status [ER+]: Principal | ICD-10-CM

## 2016-12-29 DIAGNOSIS — C50311 Malignant neoplasm of lower-inner quadrant of right female breast: Secondary | ICD-10-CM | POA: Diagnosis not present

## 2016-12-29 DIAGNOSIS — Z86010 Personal history of colonic polyps: Secondary | ICD-10-CM | POA: Diagnosis not present

## 2016-12-29 DIAGNOSIS — I890 Lymphedema, not elsewhere classified: Secondary | ICD-10-CM | POA: Diagnosis not present

## 2016-12-29 DIAGNOSIS — C773 Secondary and unspecified malignant neoplasm of axilla and upper limb lymph nodes: Secondary | ICD-10-CM | POA: Diagnosis not present

## 2016-12-29 DIAGNOSIS — J45909 Unspecified asthma, uncomplicated: Secondary | ICD-10-CM | POA: Diagnosis not present

## 2016-12-29 DIAGNOSIS — E785 Hyperlipidemia, unspecified: Secondary | ICD-10-CM | POA: Diagnosis not present

## 2016-12-29 DIAGNOSIS — Z87891 Personal history of nicotine dependence: Secondary | ICD-10-CM | POA: Diagnosis not present

## 2016-12-29 DIAGNOSIS — Z510 Encounter for antineoplastic radiation therapy: Secondary | ICD-10-CM | POA: Diagnosis not present

## 2016-12-29 NOTE — Addendum Note (Signed)
Encounter addended by: Eppie Gibson, MD on: 12/29/2016 12:16 PM<BR>    Actions taken: Sign clinical note

## 2016-12-29 NOTE — Progress Notes (Signed)
Tiffany Arnold presents for her 4th fraction of radiation to her Right Breast and Right Axilla. She denies pain, or fatigue. She reports a pinkish color to her Right Breast. She will begin using the Radiaplex twice daily today.   BP (!) 105/46   Pulse 76   Temp 98.7 F (37.1 C)   Ht 5\' 1"  (1.549 m)   Wt 216 lb 3.2 oz (98.1 kg)   SpO2 95% Comment: rooom air  BMI 40.85 kg/m    Wt Readings from Last 3 Encounters:  12/29/16 216 lb 3.2 oz (98.1 kg)  12/24/16 220 lb (99.8 kg)  12/03/16 234 lb (106.1 kg)

## 2016-12-29 NOTE — Progress Notes (Signed)
   Weekly Management Note:  Outpatient    ICD-9-CM ICD-10-CM   1. Malignant neoplasm of lower-inner quadrant of right breast of female, estrogen receptor positive (HCC) 174.3 C50.311    V86.0 Z17.0     Current Dose:  7.2 Gy  Projected Dose: 50.4 Gy   Narrative:  The patient presents for routine under treatment assessment.  CBCT/MVCT images/Port film x-rays were reviewed.  The chart was checked. Doing well  Physical Findings:  height is 5\' 1"  (1.549 m) and weight is 216 lb 3.2 oz (98.1 kg). Her temperature is 98.7 F (37.1 C). Her blood pressure is 105/46 (abnormal) and her pulse is 76. Her oxygen saturation is 95%.   Wt Readings from Last 3 Encounters:  12/29/16 216 lb 3.2 oz (98.1 kg)  12/24/16 220 lb (99.8 kg)  12/03/16 234 lb (106.1 kg)  erythema of right breast, skin intact  Impression:  The patient is tolerating radiotherapy.  Plan:  Continue radiotherapy as planned. Patient instructed to apply Radiplex to intact skin in treatment fields.    ________________________________   Eppie Gibson, M.D.

## 2016-12-30 ENCOUNTER — Ambulatory Visit
Admission: RE | Admit: 2016-12-30 | Discharge: 2016-12-30 | Disposition: A | Discharge status: Home / Self Care | Payer: Medicare PPO | Source: Ambulatory Visit | Attending: Radiation Oncology | Admitting: Radiation Oncology

## 2016-12-30 DIAGNOSIS — C50311 Malignant neoplasm of lower-inner quadrant of right female breast: Secondary | ICD-10-CM | POA: Diagnosis not present

## 2016-12-30 DIAGNOSIS — Z170 Estrogen receptor positive status [ER+]: Secondary | ICD-10-CM | POA: Diagnosis not present

## 2016-12-30 DIAGNOSIS — Z510 Encounter for antineoplastic radiation therapy: Secondary | ICD-10-CM | POA: Diagnosis not present

## 2016-12-30 DIAGNOSIS — Z87891 Personal history of nicotine dependence: Secondary | ICD-10-CM | POA: Diagnosis not present

## 2016-12-30 DIAGNOSIS — Z86010 Personal history of colonic polyps: Secondary | ICD-10-CM | POA: Diagnosis not present

## 2016-12-30 DIAGNOSIS — E785 Hyperlipidemia, unspecified: Secondary | ICD-10-CM | POA: Diagnosis not present

## 2016-12-30 DIAGNOSIS — C773 Secondary and unspecified malignant neoplasm of axilla and upper limb lymph nodes: Secondary | ICD-10-CM | POA: Diagnosis not present

## 2016-12-30 DIAGNOSIS — I890 Lymphedema, not elsewhere classified: Secondary | ICD-10-CM | POA: Diagnosis not present

## 2016-12-30 DIAGNOSIS — J45909 Unspecified asthma, uncomplicated: Secondary | ICD-10-CM | POA: Diagnosis not present

## 2016-12-31 ENCOUNTER — Ambulatory Visit
Admission: RE | Admit: 2016-12-31 | Discharge: 2016-12-31 | Disposition: A | Discharge status: Home / Self Care | Payer: Medicare PPO | Source: Ambulatory Visit | Attending: Radiation Oncology | Admitting: Radiation Oncology

## 2016-12-31 DIAGNOSIS — Z87891 Personal history of nicotine dependence: Secondary | ICD-10-CM | POA: Diagnosis not present

## 2016-12-31 DIAGNOSIS — E785 Hyperlipidemia, unspecified: Secondary | ICD-10-CM | POA: Diagnosis not present

## 2016-12-31 DIAGNOSIS — C773 Secondary and unspecified malignant neoplasm of axilla and upper limb lymph nodes: Secondary | ICD-10-CM | POA: Diagnosis not present

## 2016-12-31 DIAGNOSIS — C50311 Malignant neoplasm of lower-inner quadrant of right female breast: Secondary | ICD-10-CM | POA: Diagnosis not present

## 2016-12-31 DIAGNOSIS — J45909 Unspecified asthma, uncomplicated: Secondary | ICD-10-CM | POA: Diagnosis not present

## 2016-12-31 DIAGNOSIS — I890 Lymphedema, not elsewhere classified: Secondary | ICD-10-CM | POA: Diagnosis not present

## 2016-12-31 DIAGNOSIS — Z86010 Personal history of colonic polyps: Secondary | ICD-10-CM | POA: Diagnosis not present

## 2016-12-31 DIAGNOSIS — Z170 Estrogen receptor positive status [ER+]: Secondary | ICD-10-CM | POA: Diagnosis not present

## 2016-12-31 DIAGNOSIS — Z510 Encounter for antineoplastic radiation therapy: Secondary | ICD-10-CM | POA: Diagnosis not present

## 2017-01-01 ENCOUNTER — Ambulatory Visit
Admission: RE | Admit: 2017-01-01 | Discharge: 2017-01-01 | Disposition: A | Discharge status: Home / Self Care | Payer: Medicare PPO | Source: Ambulatory Visit | Attending: Radiation Oncology | Admitting: Radiation Oncology

## 2017-01-01 DIAGNOSIS — Z170 Estrogen receptor positive status [ER+]: Secondary | ICD-10-CM | POA: Diagnosis not present

## 2017-01-01 DIAGNOSIS — Z510 Encounter for antineoplastic radiation therapy: Secondary | ICD-10-CM | POA: Diagnosis not present

## 2017-01-01 DIAGNOSIS — Z86010 Personal history of colonic polyps: Secondary | ICD-10-CM | POA: Diagnosis not present

## 2017-01-01 DIAGNOSIS — I890 Lymphedema, not elsewhere classified: Secondary | ICD-10-CM | POA: Diagnosis not present

## 2017-01-01 DIAGNOSIS — C773 Secondary and unspecified malignant neoplasm of axilla and upper limb lymph nodes: Secondary | ICD-10-CM | POA: Diagnosis not present

## 2017-01-01 DIAGNOSIS — J45909 Unspecified asthma, uncomplicated: Secondary | ICD-10-CM | POA: Diagnosis not present

## 2017-01-01 DIAGNOSIS — E785 Hyperlipidemia, unspecified: Secondary | ICD-10-CM | POA: Diagnosis not present

## 2017-01-01 DIAGNOSIS — Z87891 Personal history of nicotine dependence: Secondary | ICD-10-CM | POA: Diagnosis not present

## 2017-01-01 DIAGNOSIS — C50311 Malignant neoplasm of lower-inner quadrant of right female breast: Secondary | ICD-10-CM | POA: Diagnosis not present

## 2017-01-02 ENCOUNTER — Ambulatory Visit
Admission: RE | Admit: 2017-01-02 | Discharge: 2017-01-02 | Disposition: A | Discharge status: Home / Self Care | Payer: Medicare PPO | Source: Ambulatory Visit | Attending: Radiation Oncology | Admitting: Radiation Oncology

## 2017-01-02 DIAGNOSIS — Z86010 Personal history of colonic polyps: Secondary | ICD-10-CM | POA: Diagnosis not present

## 2017-01-02 DIAGNOSIS — Z87891 Personal history of nicotine dependence: Secondary | ICD-10-CM | POA: Diagnosis not present

## 2017-01-02 DIAGNOSIS — J45909 Unspecified asthma, uncomplicated: Secondary | ICD-10-CM | POA: Diagnosis not present

## 2017-01-02 DIAGNOSIS — E785 Hyperlipidemia, unspecified: Secondary | ICD-10-CM | POA: Diagnosis not present

## 2017-01-02 DIAGNOSIS — Z510 Encounter for antineoplastic radiation therapy: Secondary | ICD-10-CM | POA: Diagnosis not present

## 2017-01-02 DIAGNOSIS — C773 Secondary and unspecified malignant neoplasm of axilla and upper limb lymph nodes: Secondary | ICD-10-CM | POA: Diagnosis not present

## 2017-01-02 DIAGNOSIS — I890 Lymphedema, not elsewhere classified: Secondary | ICD-10-CM | POA: Diagnosis not present

## 2017-01-02 DIAGNOSIS — Z170 Estrogen receptor positive status [ER+]: Secondary | ICD-10-CM | POA: Diagnosis not present

## 2017-01-02 DIAGNOSIS — C50311 Malignant neoplasm of lower-inner quadrant of right female breast: Secondary | ICD-10-CM | POA: Diagnosis not present

## 2017-01-05 ENCOUNTER — Ambulatory Visit (HOSPITAL_BASED_OUTPATIENT_CLINIC_OR_DEPARTMENT_OTHER): Payer: Medicare PPO | Admitting: Nurse Practitioner

## 2017-01-05 ENCOUNTER — Ambulatory Visit
Admission: RE | Admit: 2017-01-05 | Discharge: 2017-01-05 | Disposition: A | Discharge status: Home / Self Care | Payer: Medicare PPO | Source: Ambulatory Visit | Attending: Radiation Oncology | Admitting: Radiation Oncology

## 2017-01-05 ENCOUNTER — Encounter: Payer: Self-pay | Admitting: Radiation Oncology

## 2017-01-05 VITALS — BP 121/65 | HR 74 | Temp 97.9°F

## 2017-01-05 VITALS — BP 105/47 | HR 70 | Temp 98.1°F | Ht 61.0 in | Wt 217.2 lb

## 2017-01-05 DIAGNOSIS — Z86010 Personal history of colonic polyps: Secondary | ICD-10-CM | POA: Diagnosis not present

## 2017-01-05 DIAGNOSIS — W1830XA Fall on same level, unspecified, initial encounter: Secondary | ICD-10-CM | POA: Diagnosis not present

## 2017-01-05 DIAGNOSIS — W19XXXA Unspecified fall, initial encounter: Secondary | ICD-10-CM

## 2017-01-05 DIAGNOSIS — C50311 Malignant neoplasm of lower-inner quadrant of right female breast: Secondary | ICD-10-CM

## 2017-01-05 DIAGNOSIS — E785 Hyperlipidemia, unspecified: Secondary | ICD-10-CM | POA: Diagnosis not present

## 2017-01-05 DIAGNOSIS — Y92238 Other place in hospital as the place of occurrence of the external cause: Secondary | ICD-10-CM | POA: Diagnosis not present

## 2017-01-05 DIAGNOSIS — Z170 Estrogen receptor positive status [ER+]: Secondary | ICD-10-CM | POA: Diagnosis not present

## 2017-01-05 DIAGNOSIS — J45909 Unspecified asthma, uncomplicated: Secondary | ICD-10-CM | POA: Diagnosis not present

## 2017-01-05 DIAGNOSIS — C773 Secondary and unspecified malignant neoplasm of axilla and upper limb lymph nodes: Secondary | ICD-10-CM | POA: Diagnosis not present

## 2017-01-05 DIAGNOSIS — I890 Lymphedema, not elsewhere classified: Secondary | ICD-10-CM | POA: Diagnosis not present

## 2017-01-05 DIAGNOSIS — Z510 Encounter for antineoplastic radiation therapy: Secondary | ICD-10-CM | POA: Diagnosis not present

## 2017-01-05 DIAGNOSIS — Z87891 Personal history of nicotine dependence: Secondary | ICD-10-CM | POA: Diagnosis not present

## 2017-01-05 NOTE — Progress Notes (Signed)
   Weekly Management Note:  Outpatient    ICD-9-CM ICD-10-CM   1. Malignant neoplasm of lower-inner quadrant of right breast of female, estrogen receptor positive (HCC) 174.3 C50.311    V86.0 Z17.0     Current Dose:  16.2 Gy  Projected Dose: 50.4 Gy   Narrative:  The patient presents for routine under treatment assessment.  CBCT/MVCT images/Port film x-rays were reviewed.  The chart was checked. Doing well, no pain or fatigue.  Physical Findings:  height is 5\' 1"  (1.549 m) and weight is 217 lb 3.2 oz (98.5 kg). Her temperature is 98.1 F (36.7 C). Her blood pressure is 105/47 (abnormal) and her pulse is 70. Her oxygen saturation is 100%.   Wt Readings from Last 3 Encounters:  01/05/17 217 lb 3.2 oz (98.5 kg)  12/29/16 216 lb 3.2 oz (98.1 kg)  12/24/16 220 lb (99.8 kg)  Erythema of right breast, skin is intact  Impression:  The patient is tolerating radiotherapy.  Plan:  Continue radiotherapy as planned. Patient instructed to apply Radiplex to intact skin in treatment fields.    ________________________________   Eppie Gibson, M.D.

## 2017-01-05 NOTE — Progress Notes (Signed)
Tiffany Arnold is here for her 9th fraction of radiation to her Right Breast and Right Axilla. She denies pain or fatigue. Her Right Breast is slightly red. She has been using Radiaplex occasionally and I encouraged her to begin using it twice daily.   BP (!) 105/47   Pulse 70   Temp 98.1 F (36.7 C)   Ht 5\' 1"  (1.549 m)   Wt 217 lb 3.2 oz (98.5 kg)   SpO2 100% Comment: room air  BMI 41.04 kg/m    Wt Readings from Last 3 Encounters:  01/05/17 217 lb 3.2 oz (98.5 kg)  12/29/16 216 lb 3.2 oz (98.1 kg)  12/24/16 220 lb (99.8 kg)

## 2017-01-06 ENCOUNTER — Ambulatory Visit
Admission: RE | Admit: 2017-01-06 | Discharge: 2017-01-06 | Disposition: A | Discharge status: Home / Self Care | Payer: Medicare PPO | Source: Ambulatory Visit | Attending: Radiation Oncology | Admitting: Radiation Oncology

## 2017-01-06 ENCOUNTER — Encounter: Payer: Self-pay | Admitting: Nurse Practitioner

## 2017-01-06 DIAGNOSIS — Z86010 Personal history of colonic polyps: Secondary | ICD-10-CM | POA: Diagnosis not present

## 2017-01-06 DIAGNOSIS — W19XXXA Unspecified fall, initial encounter: Secondary | ICD-10-CM | POA: Insufficient documentation

## 2017-01-06 DIAGNOSIS — C773 Secondary and unspecified malignant neoplasm of axilla and upper limb lymph nodes: Secondary | ICD-10-CM | POA: Diagnosis not present

## 2017-01-06 DIAGNOSIS — C50311 Malignant neoplasm of lower-inner quadrant of right female breast: Secondary | ICD-10-CM | POA: Diagnosis not present

## 2017-01-06 DIAGNOSIS — Z87891 Personal history of nicotine dependence: Secondary | ICD-10-CM | POA: Diagnosis not present

## 2017-01-06 DIAGNOSIS — Z170 Estrogen receptor positive status [ER+]: Secondary | ICD-10-CM | POA: Diagnosis not present

## 2017-01-06 DIAGNOSIS — J45909 Unspecified asthma, uncomplicated: Secondary | ICD-10-CM | POA: Diagnosis not present

## 2017-01-06 DIAGNOSIS — Z510 Encounter for antineoplastic radiation therapy: Secondary | ICD-10-CM | POA: Diagnosis not present

## 2017-01-06 DIAGNOSIS — E785 Hyperlipidemia, unspecified: Secondary | ICD-10-CM | POA: Diagnosis not present

## 2017-01-06 DIAGNOSIS — I890 Lymphedema, not elsewhere classified: Secondary | ICD-10-CM | POA: Diagnosis not present

## 2017-01-06 NOTE — Assessment & Plan Note (Signed)
Patient had just completed her daily radiation treatments; was in the [place] with her family member.  She took a misstep-and apparently fell to the floor.  She states she thinks she hit her head; but did not lose consciousness.  Patient was helped to the wheelchair and then taken back to the [facility] infusion room for a brief exam.  Patient states that she has no injuries whatsoever.  She denies any headache or vision changes.  She denies any neck pain.  Patient was able to follow all commands and appeared neurologically intact.  Patient a full range of motion with her neck.  Confirm the patient is not taking any blood thinners at this present time.  Patient was advised to call/return or go directly to the emergency department for any worsening symptoms whatsoever.

## 2017-01-06 NOTE — Assessment & Plan Note (Signed)
Patient has completed all of her chemotherapy at this point.  She is currently undergoing daily radiation treatments for total of 7 weeks.  What she finishes all of her radiation treatments-she will be initiated with an aromatase inhibitor.  She is scheduled to return for follow-up visit on 01/29/2017.

## 2017-01-06 NOTE — Progress Notes (Signed)
SYMPTOM MANAGEMENT CLINIC    Chief Complaint: Fall  HPI:  Tiffany Arnold 76 y.o. female diagnosed with breast cancer.  Patient is status post chemotherapy and is currently undergoing radiation treatments.   Oncology History   Breast cancer of lower-inner quadrant of right female breast [facility])   Staging form: Breast, AJCC 7th Edition   - Clinical stage from 06/09/2016: Stage IIB (T2, N1, M0) - Signed by Truitt Merle, MD on 06/27/2016   - Pathologic stage from 07/29/2016: Stage IIA (T1c, N1a, cM0) - Signed by Truitt Merle, MD on 08/13/2016       Breast cancer of lower-inner quadrant of right female breast ([place])   05/23/2016 Mammogram    Diagnostic mammogram and ultrasound showed suspicious architectural distortion within the right breast lower inner quadrant, measuring 1.3 cm, without sonographic corelate.      06/03/2016 Initial Biopsy    Right breast inferior lower quadrant core needle biopsy showed atypical ductal hyperplasia with calcifications      06/09/2016 Receptors her2    Breast biopsy showed ER 100% positive, PR 100% positive, HER-2 negative, Ki-67 40%      06/09/2016 Initial Diagnosis    Breast cancer of lower-inner quadrant of right female breast ([place])      06/09/2016 Initial Biopsy    Right breast inner quadrant core needle biopsy showed invasive ductal carcinoma and DCIS, grade 1-2      06/17/2016 Initial Biopsy    Right axillary lymph node core needle biopsy showed metastatic carcinoma      06/17/2016 Receptors her2    Axillary node biopsy showed ER 100% positive, PR 95% positive, HER-2 negative      06/19/2016 Imaging    Bilateral breast MRI showed locations in the lower inner right breast, largest 2.7X1.3X1.6cm, biopsy clips in 2 of this areas. There are abnormal right axillary lymph nodes showing second cortical's, no evidence of malignancy in the left breast.      07/29/2016 Surgery    Right lumpectomy and ALND      07/29/2016 Pathology Results    Right lumpectomy  showed G3 IDC, DCIS, margins (-), LVI(-).  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   Past Medical History:  Diagnosis Date  . Allergy   . Arthritis   . Asthma   . Cancer ([place]) 07/29/2016   right breast  . Cataract of both eyes   . Chicken pox   . Colon polyps   . Dog bite of index finger 07/23/2016   Left index finger  . GERD (gastroesophageal reflux disease)   . Hyperlipidemia   . Hypertension   . IBS (irritable bowel syndrome)   . Phlebitis   . Pneumonia    hx of several yrs ago  . Shortness of breath dyspnea    with exertion only  . Sleep apnea    wears CPAP machine nightly    Past Surgical History:  Procedure Laterality Date  . BACK SURGERY     lower  . BREAST CYST EXCISION Right 2001   negative  . BREAST LUMPECTOMY WITH NEEDLE LOCALIZATION  AND AXILLARY LYMPH NODE DISSECTION Right 07/29/2016   Procedure: RIGHT BREAST LUMPECTOMY WITH DOUBLE NEEDLE LOCALIZATION AND COMPLETE RIGHT AXILLARY LYMPH NODE DISSECTION;  Surgeon: Fanny Skates, MD;  Location: [facility];  Service: General;  Laterality: Right;  . BREAST SURGERY Left 2000   Biopsy  . BUNIONECTOMY Bilateral 1998   great toe fusion on right foot  . COLONOSCOPY W/ POLYPECTOMY    . HERNIA REPAIR  2637   [place] SINUS SURGERY  2015  . PORTACATH PLACEMENT N/A 09/05/2016   Procedure: INSERTION PORT-A-CATH;  Surgeon: Fanny Skates, MD;  Location: [facility];  Service: General;  Laterality: N/A;  . REPLACEMENT TOTAL KNEE Bilateral 2007  . TOTAL SHOULDER REPLACEMENT Left 2007    has Severe obesity (BMI >= 40) ([place]); Essential hypertension; HLD (hyperlipidemia); Gastroesophageal reflux disease without  esophagitis; Asthma, mild persistent; Urge incontinence; Seasonal allergies; Generalized anxiety disorder; Type 2 diabetes mellitus without complication (HCC); OSA on CPAP; Breast cancer of lower-inner quadrant of right female breast ([place]); IBS (irritable bowel syndrome); Port catheter in place; and Fall on her problem list.    is allergic to other.  Allergies as of 01/05/2017      Reactions   Other Swelling, Shortness Of Breath   Cats, dogs, mold Induced asthma Cats, dogs, mold      Medication List       Accurate as of 01/05/17 11:59 PM. Always use your most recent med list.          albuterol-ipratropium 18-103 MCG/ACT inhaler Commonly known as:  COMBIVENT Inhale 2 puffs into the lungs every 4 (four) hours as needed for wheezing or shortness of breath.   ALPRAZolam 0.25 MG tablet Commonly known as:  XANAX TAKE ONE TABLET BY MOUTH TWICE DAILY AS NEEDED   calcium-vitamin D 500-200 MG-UNIT tablet Take 1 tablet by mouth daily.   cetirizine 10 MG tablet Commonly known as:  ZYRTEC Take 10 mg by mouth daily.   dexamethasone 4 MG tablet Commonly known as:  DECADRON TAKE ONE TABLET BY MOUTH TWICE DAILY *START  THE  DAY  BEFORE  TAXOTERE,  THEN  AGAIN  THE  DAY  AFTER  CHEMO  FOR  3  DAYS*   diphenoxylate-atropine 2.5-0.025 MG tablet Commonly known as:  LOMOTIL Take 2 tablets by mouth 4 (four) times daily as needed for diarrhea or loose stools.   EPINEPHrine 0.3 mg/0.3 mL So[place] injection Commonly known as:  EPI-PEN Inject 0.3 mg into the muscle once as needed (ALLERGIC REACTION).   Fish Oil 1000 MG Caps Take 1,000 mg by mouth daily.   glipiZIDE 10 MG tablet Commonly known as:  GLUCOTROL Take 1 tablet (10 mg total) by mouth 2 (two) times daily before a meal.   glucosamine-chondroitin 500-400 MG tablet Take 1 tablet by mouth 2 (two) times daily.   HYDROcodone-acetaminophen 5-325 MG tablet Commonly known as:  NORCO/VICODIN Take 1-2 tablets by mouth every 4 (four) hours as  needed.   lidocaine-prilocaine cream Commonly known as:  EMLA Apply to port at least 1 hour prior to chemo   magic mouthwash Soln Swish and Spit 5-10 mLs 4 times a day as needed.   metFORMIN 1000 MG tablet Commonly known as:  GLUCOPHAGE   Olmesartan-Amlodipine-HCTZ 40-5-25 MG Tabs Take 1 tablet by mouth daily.   ondansetron 8 MG tablet Commonly known as:  ZOFRAN TAKE ONE TABLET BY MOUTH TWICE DAILY AS NEEDED FOR  REFRACTORY  NAUSEA/VOMITING.  START  ON  DAY  3  AFTER  CHEMO.   oxybutynin 10 MG 24 hr tablet Commonly known as:  DITROPAN-XL Take 1 tablet (10 mg total) by mouth at bedtime.   pravastatin 40 MG tablet Commonly known as:  PRAVACHOL Take 1 tablet (40 mg total) by mouth daily.   prochlorperazine 10 MG tablet Commonly known as:  COMPAZINE TAKE ONE TABLET BY MOUTH EVERY 6 HOURS AS NEEDED FOR NAUSEA AND VOMITING   ranitidine 150 MG tablet Commonly known as:  ZANTAC Take 150 mg by mouth every evening.   sertraline 25 MG tablet Commonly known as:  ZOLOFT Take 1 tablet (25 mg total) by mouth daily.   vitamin B-12 1000 MCG tablet Commonly known as:  CYANOCOBALAMIN Take 1,000 mcg by mouth daily.        PHYSICAL EXAMINATION  Oncology Vitals 01/05/2017 01/05/2017  Height - 155 cm  Weight - 98.521 kg  Weight (lbs) - 217 lbs 3 oz  BMI (kg/m2) - 41.04 kg/m2  Temp 97.9 98.1  Pulse 74 70  Resp - -  SpO2 97 100  BSA (m2) - 2.06 m2   BP Readings from Last 2 Encounters:  01/05/17 (!) 105/47  01/05/17 121/65    Physical Exam  Constitutional: She is oriented to person, place, and time and well-developed, well-nourished, and in no distress.  HENT:  Head: Normocephalic and atraumatic.  Eyes: Conjunctivae and EOM are normal. Pupils are equal, round, and reactive to light. Right eye exhibits no discharge. Left eye exhibits no discharge. No scleral icterus.  Neck: Normal range of motion.  Pulmonary/Chest: Effort normal. No respiratory distress.  Musculoskeletal:  Normal range of motion. She exhibits no edema, tenderness or deformity.  Neurological: She is alert and oriented to person, place, and time.  Skin: Skin is warm and dry.  Psychiatric: Affect normal.  Nursing note and vitals reviewed.   LABORATORY DATA:. No visits with results within 3 Day(s) from this visit.  Latest known visit with results is:  Appointment on 12/24/2016  Component Date Value Ref Range Status  . WBC 12/24/2016 10.4* 3.9 - 10.3 10e3/uL Final  . NEUT# 12/24/2016 7.5* 1.5 - 6.5 10e3/uL Final  . HGB 12/24/2016 12.3  11.6 - 15.9 g/dL Final  . HCT 12/24/2016 38.5  34.8 - 46.6 % Final  . Platelets 12/24/2016 346  145 - 400 10e3/uL Final  . MCV 12/24/2016 88.7  79.5 - 101.0 fL Final  . MCH 12/24/2016 28.3  25.1 - 34.0 pg Final  . MCHC 12/24/2016 31.9  31.5 - 36.0 g/dL Final  . RBC 12/24/2016 4.34  3.70 - 5.45 10e6/uL Final  . RDW 12/24/2016 14.9* 11.2 - 14.5 % Final  . lymph# 12/24/2016 2.2  0.9 - 3.3 10e3/uL Final  . MONO# 12/24/2016 0.6  0.1 - 0.9 10e3/uL Final  . Eosinophils Absolute 12/24/2016 0.1  0.0 - 0.5 10e3/uL Final  . Basophils Absolute 12/24/2016 0.0  0.0 - 0.1 10e3/uL Final  . NEUT% 12/24/2016 71.9  38.4 - 76.8 % Final  . LYMPH% 12/24/2016 21.4  14.0 - 49.7 % Final  . MONO% 12/24/2016 5.8  0.0 - 14.0 % Final  . EOS% 12/24/2016 0.6  0.0 - 7.0 % Final  . BASO% 12/24/2016 0.3  0.0 - 2.0 % Final  . Sodium 12/24/2016 137  136 - 145 mEq/L Final  . Potassium 12/24/2016 4.1  3.5 - 5.1 mEq/L Final  . Chloride 12/24/2016 96* 98 - 109 mEq/L Final  . CO2 12/24/2016 28  22 - 29 mEq/L Final  .  Glucose 12/24/2016 109  70 - 140 mg/dl Final  . BUN 12/24/2016 12.5  7.0 - 26.0 mg/dL Final  . Creatinine 12/24/2016 0.8  0.6 - 1.1 mg/dL Final  . Total Bilirubin 12/24/2016 0.64  0.20 - 1.20 mg/dL Final  . Alkaline Phosphatase 12/24/2016 73  40 - 150 U/L Final  . AST 12/24/2016 21  5 - 34 U/L Final  . ALT 12/24/2016 19  0 - 55 U/L Final  . Total Protein 12/24/2016 7.1  6.4 -  8.3 g/dL Final  . Albumin 12/24/2016 3.9  3.5 - 5.0 g/dL Final  . Calcium 12/24/2016 11.1* 8.4 - 10.4 mg/dL Final  . Anion Gap 12/24/2016 12* 3 - 11 mEq/L Final  . EGFR 12/24/2016 75* >90 ml/min/1.73 m2 Final    RADIOGRAPHIC STUDIES: No results found.  ASSESSMENT/PLAN:    Fall Patient had just completed her daily radiation treatments; was in the [place] with her family member.  She took a misstep-and apparently fell to the floor.  She states she thinks she hit her head; but did not lose consciousness.  Patient was helped to the wheelchair and then taken back to the [facility] infusion room for a brief exam.  Patient states that she has no injuries whatsoever.  She denies any headache or vision changes.  She denies any neck pain.  Patient was able to follow all commands and appeared neurologically intact.  Patient a full range of motion with her neck.  Confirm the patient is not taking any blood thinners at this present time.  Patient was advised to call/return or go directly to the emergency department for any worsening symptoms whatsoever.  Breast cancer of lower-inner quadrant of right female breast [facility]) Patient has completed all of her chemotherapy at this point.  She is currently undergoing daily radiation treatments for total of 7 weeks.  What she finishes all of her radiation treatments-she will be initiated with an aromatase inhibitor.  She is scheduled to return for follow-up visit on 01/29/2017.   Patient stated understanding of all instructions; and was in agreement with this plan of care. The patient knows to call the clinic with any problems, questions or concerns.   Total time spent with patient was 15 minutes;  with greater than 75 percent of that time spent in face to face counseling regarding patient's symptoms,  and coordination of care and follow up.  Disclaimer:This dictation was prepared with Dragon/digital dictation along with [employer]. Any transcriptional errors that result from this process are unintentional.  Drue Second, NP 01/06/2017

## 2017-01-07 ENCOUNTER — Ambulatory Visit: Payer: Medicare PPO

## 2017-01-07 DIAGNOSIS — G4733 Obstructive sleep apnea (adult) (pediatric): Secondary | ICD-10-CM | POA: Diagnosis not present

## 2017-01-08 ENCOUNTER — Ambulatory Visit: Payer: Medicare PPO

## 2017-01-09 ENCOUNTER — Ambulatory Visit
Admission: RE | Admit: 2017-01-09 | Discharge: 2017-01-09 | Disposition: A | Discharge status: Home / Self Care | Payer: Medicare PPO | Source: Ambulatory Visit | Attending: Radiation Oncology | Admitting: Radiation Oncology

## 2017-01-09 DIAGNOSIS — J45909 Unspecified asthma, uncomplicated: Secondary | ICD-10-CM | POA: Diagnosis not present

## 2017-01-09 DIAGNOSIS — C50311 Malignant neoplasm of lower-inner quadrant of right female breast: Secondary | ICD-10-CM | POA: Diagnosis not present

## 2017-01-09 DIAGNOSIS — Z86010 Personal history of colonic polyps: Secondary | ICD-10-CM | POA: Diagnosis not present

## 2017-01-09 DIAGNOSIS — C773 Secondary and unspecified malignant neoplasm of axilla and upper limb lymph nodes: Secondary | ICD-10-CM | POA: Diagnosis not present

## 2017-01-09 DIAGNOSIS — I890 Lymphedema, not elsewhere classified: Secondary | ICD-10-CM | POA: Diagnosis not present

## 2017-01-09 DIAGNOSIS — Z87891 Personal history of nicotine dependence: Secondary | ICD-10-CM | POA: Diagnosis not present

## 2017-01-09 DIAGNOSIS — Z170 Estrogen receptor positive status [ER+]: Secondary | ICD-10-CM | POA: Diagnosis not present

## 2017-01-09 DIAGNOSIS — E785 Hyperlipidemia, unspecified: Secondary | ICD-10-CM | POA: Diagnosis not present

## 2017-01-09 DIAGNOSIS — Z510 Encounter for antineoplastic radiation therapy: Secondary | ICD-10-CM | POA: Diagnosis not present

## 2017-01-12 ENCOUNTER — Encounter: Payer: Self-pay | Admitting: Radiation Oncology

## 2017-01-12 ENCOUNTER — Ambulatory Visit
Admission: RE | Admit: 2017-01-12 | Discharge: 2017-01-12 | Disposition: A | Discharge status: Home / Self Care | Payer: Medicare PPO | Source: Ambulatory Visit | Attending: Radiation Oncology | Admitting: Radiation Oncology

## 2017-01-12 VITALS — BP 123/61 | HR 74 | Temp 97.8°F | Ht 61.0 in | Wt 217.0 lb

## 2017-01-12 DIAGNOSIS — C773 Secondary and unspecified malignant neoplasm of axilla and upper limb lymph nodes: Secondary | ICD-10-CM | POA: Diagnosis not present

## 2017-01-12 DIAGNOSIS — Z86010 Personal history of colonic polyps: Secondary | ICD-10-CM | POA: Diagnosis not present

## 2017-01-12 DIAGNOSIS — Z170 Estrogen receptor positive status [ER+]: Secondary | ICD-10-CM | POA: Diagnosis not present

## 2017-01-12 DIAGNOSIS — Z510 Encounter for antineoplastic radiation therapy: Secondary | ICD-10-CM | POA: Diagnosis not present

## 2017-01-12 DIAGNOSIS — J45909 Unspecified asthma, uncomplicated: Secondary | ICD-10-CM | POA: Diagnosis not present

## 2017-01-12 DIAGNOSIS — I890 Lymphedema, not elsewhere classified: Secondary | ICD-10-CM | POA: Diagnosis not present

## 2017-01-12 DIAGNOSIS — C50311 Malignant neoplasm of lower-inner quadrant of right female breast: Secondary | ICD-10-CM

## 2017-01-12 DIAGNOSIS — Z87891 Personal history of nicotine dependence: Secondary | ICD-10-CM | POA: Diagnosis not present

## 2017-01-12 DIAGNOSIS — E785 Hyperlipidemia, unspecified: Secondary | ICD-10-CM | POA: Diagnosis not present

## 2017-01-12 NOTE — Progress Notes (Signed)
Tiffany Arnold has completed 12 fractions to her right breast.  She denies having pain or fatigue.  She is using radiaplex BID.  The skin on her right breast is pink.  BP 123/61 (BP Location: Left Arm, Patient Position: Sitting)   Pulse 74   Temp 97.8 F (36.6 C) (Oral)   Ht 5\' 1"  (1.549 m)   Wt 217 lb (98.4 kg)   SpO2 100%   BMI 41.00 kg/m    Wt Readings from Last 3 Encounters:  01/12/17 217 lb (98.4 kg)  01/05/17 217 lb 3.2 oz (98.5 kg)  12/29/16 216 lb 3.2 oz (98.1 kg)

## 2017-01-12 NOTE — Progress Notes (Signed)
   Weekly Management Note:  Outpatient    ICD-9-CM ICD-10-CM   1. Malignant neoplasm of lower-inner quadrant of right breast of female, estrogen receptor positive (HCC) 174.3 C50.311    V86.0 Z17.0     Current Dose:  21.6 Gy  Projected Dose: 50.4 Gy   Narrative:  The patient presents for routine under treatment assessment.  CBCT/MVCT images/Port film x-rays were reviewed.  The chart was checked. Doing well, no pain or fatigue. Skin is pinker over right breast.  Physical Findings:  height is 5\' 1"  (1.549 m) and weight is 217 lb (98.4 kg). Her oral temperature is 97.8 F (36.6 C). Her blood pressure is 123/61 and her pulse is 74. Her oxygen saturation is 100%.   Wt Readings from Last 3 Encounters:  01/12/17 217 lb (98.4 kg)  01/05/17 217 lb 3.2 oz (98.5 kg)  12/29/16 216 lb 3.2 oz (98.1 kg)  Bright erythema of right breast, skin is intact  Impression:  The patient is tolerating radiotherapy.  Plan:  Continue radiotherapy as planned. Patient instructed to apply Radiplex to intact skin in treatment fields.   ________________________________   Eppie Gibson, M.D.

## 2017-01-13 ENCOUNTER — Ambulatory Visit
Admission: RE | Admit: 2017-01-13 | Discharge: 2017-01-13 | Disposition: A | Discharge status: Home / Self Care | Payer: Medicare PPO | Source: Ambulatory Visit | Attending: Radiation Oncology | Admitting: Radiation Oncology

## 2017-01-13 DIAGNOSIS — E785 Hyperlipidemia, unspecified: Secondary | ICD-10-CM | POA: Diagnosis not present

## 2017-01-13 DIAGNOSIS — C50311 Malignant neoplasm of lower-inner quadrant of right female breast: Secondary | ICD-10-CM | POA: Diagnosis not present

## 2017-01-13 DIAGNOSIS — Z510 Encounter for antineoplastic radiation therapy: Secondary | ICD-10-CM | POA: Diagnosis not present

## 2017-01-13 DIAGNOSIS — C773 Secondary and unspecified malignant neoplasm of axilla and upper limb lymph nodes: Secondary | ICD-10-CM | POA: Diagnosis not present

## 2017-01-13 DIAGNOSIS — I890 Lymphedema, not elsewhere classified: Secondary | ICD-10-CM | POA: Diagnosis not present

## 2017-01-13 DIAGNOSIS — Z86010 Personal history of colonic polyps: Secondary | ICD-10-CM | POA: Diagnosis not present

## 2017-01-13 DIAGNOSIS — J45909 Unspecified asthma, uncomplicated: Secondary | ICD-10-CM | POA: Diagnosis not present

## 2017-01-13 DIAGNOSIS — Z170 Estrogen receptor positive status [ER+]: Secondary | ICD-10-CM | POA: Diagnosis not present

## 2017-01-13 DIAGNOSIS — Z87891 Personal history of nicotine dependence: Secondary | ICD-10-CM | POA: Diagnosis not present

## 2017-01-14 ENCOUNTER — Ambulatory Visit
Admission: RE | Admit: 2017-01-14 | Discharge: 2017-01-14 | Disposition: A | Discharge status: Home / Self Care | Payer: Medicare PPO | Source: Ambulatory Visit | Attending: Radiation Oncology | Admitting: Radiation Oncology

## 2017-01-14 DIAGNOSIS — C773 Secondary and unspecified malignant neoplasm of axilla and upper limb lymph nodes: Secondary | ICD-10-CM | POA: Diagnosis not present

## 2017-01-14 DIAGNOSIS — J45909 Unspecified asthma, uncomplicated: Secondary | ICD-10-CM | POA: Diagnosis not present

## 2017-01-14 DIAGNOSIS — Z87891 Personal history of nicotine dependence: Secondary | ICD-10-CM | POA: Diagnosis not present

## 2017-01-14 DIAGNOSIS — Z86010 Personal history of colonic polyps: Secondary | ICD-10-CM | POA: Diagnosis not present

## 2017-01-14 DIAGNOSIS — C50311 Malignant neoplasm of lower-inner quadrant of right female breast: Secondary | ICD-10-CM | POA: Diagnosis not present

## 2017-01-14 DIAGNOSIS — I890 Lymphedema, not elsewhere classified: Secondary | ICD-10-CM | POA: Diagnosis not present

## 2017-01-14 DIAGNOSIS — Z510 Encounter for antineoplastic radiation therapy: Secondary | ICD-10-CM | POA: Diagnosis not present

## 2017-01-14 DIAGNOSIS — Z170 Estrogen receptor positive status [ER+]: Secondary | ICD-10-CM | POA: Diagnosis not present

## 2017-01-14 DIAGNOSIS — E785 Hyperlipidemia, unspecified: Secondary | ICD-10-CM | POA: Diagnosis not present

## 2017-01-15 ENCOUNTER — Ambulatory Visit
Admission: RE | Admit: 2017-01-15 | Discharge: 2017-01-15 | Disposition: A | Discharge status: Home / Self Care | Payer: Medicare PPO | Source: Ambulatory Visit | Attending: Radiation Oncology | Admitting: Radiation Oncology

## 2017-01-15 DIAGNOSIS — Z87891 Personal history of nicotine dependence: Secondary | ICD-10-CM | POA: Diagnosis not present

## 2017-01-15 DIAGNOSIS — I890 Lymphedema, not elsewhere classified: Secondary | ICD-10-CM | POA: Diagnosis not present

## 2017-01-15 DIAGNOSIS — C50311 Malignant neoplasm of lower-inner quadrant of right female breast: Secondary | ICD-10-CM | POA: Diagnosis not present

## 2017-01-15 DIAGNOSIS — E785 Hyperlipidemia, unspecified: Secondary | ICD-10-CM | POA: Diagnosis not present

## 2017-01-15 DIAGNOSIS — C773 Secondary and unspecified malignant neoplasm of axilla and upper limb lymph nodes: Secondary | ICD-10-CM | POA: Diagnosis not present

## 2017-01-15 DIAGNOSIS — Z86010 Personal history of colonic polyps: Secondary | ICD-10-CM | POA: Diagnosis not present

## 2017-01-15 DIAGNOSIS — Z510 Encounter for antineoplastic radiation therapy: Secondary | ICD-10-CM | POA: Diagnosis not present

## 2017-01-15 DIAGNOSIS — Z170 Estrogen receptor positive status [ER+]: Secondary | ICD-10-CM | POA: Diagnosis not present

## 2017-01-15 DIAGNOSIS — J45909 Unspecified asthma, uncomplicated: Secondary | ICD-10-CM | POA: Diagnosis not present

## 2017-01-16 ENCOUNTER — Ambulatory Visit
Admission: RE | Admit: 2017-01-16 | Discharge: 2017-01-16 | Disposition: A | Discharge status: Home / Self Care | Payer: Medicare PPO | Source: Ambulatory Visit | Attending: Radiation Oncology | Admitting: Radiation Oncology

## 2017-01-16 DIAGNOSIS — Z86010 Personal history of colonic polyps: Secondary | ICD-10-CM | POA: Diagnosis not present

## 2017-01-16 DIAGNOSIS — C773 Secondary and unspecified malignant neoplasm of axilla and upper limb lymph nodes: Secondary | ICD-10-CM | POA: Diagnosis not present

## 2017-01-16 DIAGNOSIS — Z170 Estrogen receptor positive status [ER+]: Secondary | ICD-10-CM | POA: Diagnosis not present

## 2017-01-16 DIAGNOSIS — E785 Hyperlipidemia, unspecified: Secondary | ICD-10-CM | POA: Diagnosis not present

## 2017-01-16 DIAGNOSIS — I890 Lymphedema, not elsewhere classified: Secondary | ICD-10-CM | POA: Diagnosis not present

## 2017-01-16 DIAGNOSIS — C50311 Malignant neoplasm of lower-inner quadrant of right female breast: Secondary | ICD-10-CM | POA: Diagnosis not present

## 2017-01-16 DIAGNOSIS — Z87891 Personal history of nicotine dependence: Secondary | ICD-10-CM | POA: Diagnosis not present

## 2017-01-16 DIAGNOSIS — J45909 Unspecified asthma, uncomplicated: Secondary | ICD-10-CM | POA: Diagnosis not present

## 2017-01-16 DIAGNOSIS — Z510 Encounter for antineoplastic radiation therapy: Secondary | ICD-10-CM | POA: Diagnosis not present

## 2017-01-19 ENCOUNTER — Encounter: Payer: Self-pay | Admitting: Radiation Oncology

## 2017-01-19 ENCOUNTER — Telehealth: Payer: Self-pay | Admitting: *Deleted

## 2017-01-19 ENCOUNTER — Ambulatory Visit
Admission: RE | Admit: 2017-01-19 | Discharge: 2017-01-19 | Disposition: A | Discharge status: Home / Self Care | Payer: Medicare PPO | Source: Ambulatory Visit | Admitting: Radiation Oncology

## 2017-01-19 ENCOUNTER — Ambulatory Visit
Admission: RE | Admit: 2017-01-19 | Discharge: 2017-01-19 | Disposition: A | Discharge status: Home / Self Care | Payer: Medicare PPO | Source: Ambulatory Visit | Attending: Radiation Oncology | Admitting: Radiation Oncology

## 2017-01-19 VITALS — BP 144/63 | HR 78 | Temp 98.1°F | Ht 61.0 in | Wt 212.8 lb

## 2017-01-19 DIAGNOSIS — Z87891 Personal history of nicotine dependence: Secondary | ICD-10-CM | POA: Diagnosis not present

## 2017-01-19 DIAGNOSIS — C773 Secondary and unspecified malignant neoplasm of axilla and upper limb lymph nodes: Secondary | ICD-10-CM | POA: Diagnosis not present

## 2017-01-19 DIAGNOSIS — Z170 Estrogen receptor positive status [ER+]: Secondary | ICD-10-CM | POA: Diagnosis not present

## 2017-01-19 DIAGNOSIS — C50311 Malignant neoplasm of lower-inner quadrant of right female breast: Secondary | ICD-10-CM | POA: Diagnosis not present

## 2017-01-19 DIAGNOSIS — Z510 Encounter for antineoplastic radiation therapy: Secondary | ICD-10-CM | POA: Diagnosis not present

## 2017-01-19 DIAGNOSIS — I890 Lymphedema, not elsewhere classified: Secondary | ICD-10-CM | POA: Diagnosis not present

## 2017-01-19 DIAGNOSIS — Z86010 Personal history of colonic polyps: Secondary | ICD-10-CM | POA: Diagnosis not present

## 2017-01-19 DIAGNOSIS — J45909 Unspecified asthma, uncomplicated: Secondary | ICD-10-CM | POA: Diagnosis not present

## 2017-01-19 DIAGNOSIS — E785 Hyperlipidemia, unspecified: Secondary | ICD-10-CM | POA: Diagnosis not present

## 2017-01-19 NOTE — Progress Notes (Signed)
Ms. Mullan presents for her 17th fraction of radiation to her Right Breast and Axilla. She denies pain or fatigue. Her Right Breast is red and slightly swollen. She has redness to the area underneath her Right Breast. She is using Radiaplex twice daily as directed. She is having occasional diarrhea and has been experiencing it since her chemotherapy at times.   BP (!) 144/63   Pulse 78   Temp 98.1 F (36.7 C)   Ht 5\' 1"  (1.549 m)   Wt 212 lb 12.8 oz (96.5 kg)   SpO2 98% Comment: room air  BMI 40.21 kg/m    Wt Readings from Last 3 Encounters:  01/19/17 212 lb 12.8 oz (96.5 kg)  01/12/17 217 lb (98.4 kg)  01/05/17 217 lb 3.2 oz (98.5 kg)

## 2017-01-19 NOTE — Telephone Encounter (Signed)
CALLED PATIENT TO INFORM OF PT APPT. FOR 01-20-17, PATIENT DECLINED THIS APPT. APPT. TO BE RESCHEDULED FOR NEXT WEEK IF POSSIBLE, NOTIFIED [place]

## 2017-01-19 NOTE — Progress Notes (Addendum)
   Weekly Management Note:  Outpatient    ICD-9-CM ICD-10-CM   1. Malignant neoplasm of lower-inner quadrant of right breast of female, estrogen receptor positive (HCC) 174.3 C50.311 Ambulatory referral to Physical Therapy   V86.0 Z17.0     Current Dose: 28.8 Gy  Projected Dose: 50.4 Gy   Narrative:  The patient presents for routine under treatment assessment.  CBCT/MVCT images/Port film x-rays were reviewed.  The chart was checked. Doing well, no pain or fatigue. She acknowledges increased lymphedema in right arm since discharge from PT.  The sleeve is very difficult to put on by herself  Physical Findings:  height is 5\' 1"  (1.549 m) and weight is 212 lb 12.8 oz (96.5 kg). Her temperature is 98.1 F (36.7 C). Her blood pressure is 144/63 (abnormal) and her pulse is 78. Her oxygen saturation is 98%.   Wt Readings from Last 3 Encounters:  01/19/17 212 lb 12.8 oz (96.5 kg)  01/12/17 217 lb (98.4 kg)  01/05/17 217 lb 3.2 oz (98.5 kg)  Bright erythema of right breast, skin is intact Lymphedema right arm.  Impression:  The patient is tolerating radiotherapy.  Plan:  Continue radiotherapy as planned. Patient instructed to apply Radiplex to intact skin in treatment fields. Refer back to PT for progressive lymphedema of RUE since discharge and difficulty applying sleeve.   ________________________________   Eppie Gibson, M.D.

## 2017-01-20 ENCOUNTER — Ambulatory Visit: Payer: Medicare PPO | Admitting: Physical Therapy

## 2017-01-20 ENCOUNTER — Ambulatory Visit
Admission: RE | Admit: 2017-01-20 | Discharge: 2017-01-20 | Disposition: A | Discharge status: Home / Self Care | Payer: Medicare PPO | Source: Ambulatory Visit | Attending: Radiation Oncology | Admitting: Radiation Oncology

## 2017-01-20 DIAGNOSIS — Z86010 Personal history of colonic polyps: Secondary | ICD-10-CM | POA: Diagnosis not present

## 2017-01-20 DIAGNOSIS — Z87891 Personal history of nicotine dependence: Secondary | ICD-10-CM | POA: Diagnosis not present

## 2017-01-20 DIAGNOSIS — Z170 Estrogen receptor positive status [ER+]: Secondary | ICD-10-CM | POA: Diagnosis not present

## 2017-01-20 DIAGNOSIS — I890 Lymphedema, not elsewhere classified: Secondary | ICD-10-CM | POA: Diagnosis not present

## 2017-01-20 DIAGNOSIS — J45909 Unspecified asthma, uncomplicated: Secondary | ICD-10-CM | POA: Diagnosis not present

## 2017-01-20 DIAGNOSIS — C773 Secondary and unspecified malignant neoplasm of axilla and upper limb lymph nodes: Secondary | ICD-10-CM | POA: Diagnosis not present

## 2017-01-20 DIAGNOSIS — Z510 Encounter for antineoplastic radiation therapy: Secondary | ICD-10-CM | POA: Diagnosis not present

## 2017-01-20 DIAGNOSIS — E785 Hyperlipidemia, unspecified: Secondary | ICD-10-CM | POA: Diagnosis not present

## 2017-01-20 DIAGNOSIS — C50311 Malignant neoplasm of lower-inner quadrant of right female breast: Secondary | ICD-10-CM | POA: Diagnosis not present

## 2017-01-21 ENCOUNTER — Ambulatory Visit: Payer: Medicare PPO | Attending: Radiation Oncology | Admitting: Physical Therapy

## 2017-01-21 ENCOUNTER — Ambulatory Visit
Admission: RE | Admit: 2017-01-21 | Discharge: 2017-01-21 | Disposition: A | Discharge status: Home / Self Care | Payer: Medicare PPO | Source: Ambulatory Visit | Attending: Radiation Oncology | Admitting: Radiation Oncology

## 2017-01-21 DIAGNOSIS — C773 Secondary and unspecified malignant neoplasm of axilla and upper limb lymph nodes: Secondary | ICD-10-CM | POA: Diagnosis not present

## 2017-01-21 DIAGNOSIS — E785 Hyperlipidemia, unspecified: Secondary | ICD-10-CM | POA: Diagnosis not present

## 2017-01-21 DIAGNOSIS — Z510 Encounter for antineoplastic radiation therapy: Secondary | ICD-10-CM | POA: Diagnosis not present

## 2017-01-21 DIAGNOSIS — J45909 Unspecified asthma, uncomplicated: Secondary | ICD-10-CM | POA: Diagnosis not present

## 2017-01-21 DIAGNOSIS — I890 Lymphedema, not elsewhere classified: Secondary | ICD-10-CM | POA: Diagnosis not present

## 2017-01-21 DIAGNOSIS — Z170 Estrogen receptor positive status [ER+]: Secondary | ICD-10-CM | POA: Diagnosis not present

## 2017-01-21 DIAGNOSIS — M6281 Muscle weakness (generalized): Secondary | ICD-10-CM | POA: Insufficient documentation

## 2017-01-21 DIAGNOSIS — Z86010 Personal history of colonic polyps: Secondary | ICD-10-CM | POA: Diagnosis not present

## 2017-01-21 DIAGNOSIS — C50311 Malignant neoplasm of lower-inner quadrant of right female breast: Secondary | ICD-10-CM | POA: Diagnosis not present

## 2017-01-21 DIAGNOSIS — Z87891 Personal history of nicotine dependence: Secondary | ICD-10-CM | POA: Diagnosis not present

## 2017-01-21 NOTE — Therapy (Signed)
[place], [place], [address] Phone: [phone]   Fax:  [phone]  Physical Therapy Evaluation  Patient Details  Name: Tiffany Arnold MRN: [id number] Date of Birth: Oct 24, 1941 Referring Provider: Dr. Eppie Gibson  Encounter Date: 01/21/2017      PT End of Session - 01/21/17 1706    Visit Number 1   Number of Visits 9   Date for PT Re-Evaluation 02/27/17   PT Start Time 1115   PT Stop Time 1151   PT Time Calculation (min) 36 min   Activity Tolerance Patient tolerated treatment well   Behavior During Therapy [facility] for tasks assessed/performed      Past Medical History:  Diagnosis Date  . Allergy   . Arthritis   . Asthma   . Cancer ([place]) 07/29/2016   right breast  . Cataract of both eyes   . Chicken pox   . Colon polyps   . Dog bite of index finger 07/23/2016   Left index finger  . GERD (gastroesophageal reflux disease)   . Hyperlipidemia   . Hypertension   . IBS (irritable bowel syndrome)   . Phlebitis   . Pneumonia    hx of several yrs ago  . Shortness of breath dyspnea    with exertion only  . Sleep apnea    wears CPAP machine nightly    Past Surgical History:  Procedure Laterality Date  . BACK SURGERY     lower  . BREAST CYST EXCISION Right 2001   negative  . BREAST LUMPECTOMY WITH NEEDLE LOCALIZATION AND AXILLARY LYMPH NODE DISSECTION Right 07/29/2016   Procedure: RIGHT BREAST LUMPECTOMY WITH DOUBLE NEEDLE LOCALIZATION AND COMPLETE RIGHT AXILLARY LYMPH NODE DISSECTION;  Surgeon: Fanny Skates, MD;  Location: [place];  Service: General;  Laterality: Right;  . BREAST SURGERY Left 2000   Biopsy  . BUNIONECTOMY Bilateral 1998   great toe fusion on right foot  . COLONOSCOPY W/ POLYPECTOMY    . HERNIA REPAIR  [id number]   [place] SINUS SURGERY  2015  . PORTACATH PLACEMENT N/A 09/05/2016   Procedure: INSERTION PORT-A-CATH;  Surgeon: Fanny Skates, MD;  Location[facility];   Service: General;  Laterality: N/A;  . REPLACEMENT TOTAL KNEE Bilateral 2007  . TOTAL SHOULDER REPLACEMENT Left 2007    There were no vitals filed for this visit.       Subjective Assessment - 01/21/17 1130    Subjective Started radiation 12/24/16.  Having trouble getting the compression sleeve on herself or by her husband; the CNA comes three times a week and can put it on after the radiation treatments. The arm has gotten bigger partly because her husband puts the velcro sleeve on at night but doesn't get it on all the way.   Pertinent History Right breast lumpectomy with 16 lymph nodes removed on July 29, 2016. Had chemotherapy.  Started XRT 12/24/16 and should finish it 02/03/17.  Has had bilateral knee replacements within the past 5 years. Had left shoulder replaced. Patient has been seen here about three months ago.   Patient Stated Goals get the arm to reduce   Currently in Pain? Yes   Pain Score 5    Pain Location Shoulder   Pain Orientation Right;Left   Pain Descriptors / Indicators Sore  not constant   Aggravating Factors  nothing in particular, certain movements   Pain Relieving Factors let time pass  [facility] PT Assessment - 01/21/17 0001      Assessment   Medical Diagnosis breast cancer   Referring Provider Dr. Eppie Gibson   Onset Date/Surgical Date 07/29/16     Precautions   Precautions Other (comment);Fall   Precaution Comments cancer precautions     Restrictions   Weight Bearing Restrictions No     Balance Screen   Has the patient fallen in the past 6 months Yes   How many times? 10  approx.--she says she doesn't lift feet enough   Has the patient had a decrease in activity level because of a fear of falling?  No   Is the patient reluctant to leave their home because of a fear of falling?  No     Home Environment   Living Environment Private residence   Living Arrangements Spouse/significant other   Type of [place]  entrance   Additional Comments husband is in early stages of Alzheimers.  She has help from her sister.      Prior Function   Level of Independence Requires assistive device for independence  for some tasks      Cognition   Memory Impaired  difficulty reporting some of her own medical history today     Posture/Postural Control   Postural Limitations Rounded Shoulders;Forward head;Decreased thoracic kyphosis     Ambulation/Gait   Ambulation/Gait Yes   Ambulation/Gait Assistance 6: Modified independent (Device/Increase time)   Assistive device Rollator   Gait Comments Trendelenberg           LYMPHEDEMA/ONCOLOGY QUESTIONNAIRE - 01/21/17 1140      Type   Cancer Type right breast     Treatment   Past Chemotherapy Treatment Yes   Active Radiation Treatment Yes   Date 12/24/16     Right Upper Extremity Lymphedema   15 cm Proximal to Olecranon Process 43 cm   10 cm Proximal to Olecranon Process 41 cm   Olecranon Process 30 cm   15 cm Proximal to Ulnar Styloid Process 30.4 cm   10 cm Proximal to Ulnar Styloid Process 28.8 cm   Just Proximal to Ulnar Styloid Process 19.8 cm   Across Hand at [employer] 18.9 cm   At [place] of 2nd Digit 6.3 cm                                Long Term Clinic Goals - 01/21/17 1712      CC Long Term Goal  #8   Title Patient will be knowledgeable about best use of her day- and nighttime compression garments (including using velcro garment in daytime when needed)   Time 4   Period Weeks   Status New     Additional Goals   Additional Goals Yes     Additional Goal #1   Title Right arm circumference at 10 cm. proximal to ulnar styloid will reduce to 28 cm. or less   Baseline 28.8 at time of re-eval on 01/21/17   Time 4   Period Weeks   Status New            Plan - 01/21/17 1707    Clinical Impression Statement This is a patient known to this clinic from treatment a few months ago for right UE lymphedema.   Arm circumference measurements taken today show that the right arm is larger than it was when she was discharged 10/29/16,  but smaller than earlier in her course of treatment with [place] last fall.  Part of the issue with her is difficulty she has regularly using compression garments that she has gotten.  She has a daytime sleeve that she cannot get on without help (and her husband can't either); she has a velcro garment that she reports doing better with, but she did not have it here today to check its fit.  This is a re-eval today since patient was here just a few months ago with a similar problem, but has had some change in medical status as she has now started XRT.   Rehab Potential Good   Clinical Impairments Affecting Rehab Potential previous total joint replacements in other joints with generalized weakness, obesity; caution of left total shoulder replacement  16 lymph nodes removed from right axilla    PT Frequency 2x / week   PT Duration 4 weeks   PT Treatment/Interventions ADLs/Self Care Home Management;DME Instruction;Gait training;Functional mobility training;Therapeutic activities;Therapeutic exercise;Balance training;Neuromuscular re-education;Patient/family education;Manual techniques;Manual lymph drainage;Compression bandaging;Taping   PT Next Visit Plan Focus on manual lymph drainage, but check patient's velcro garment and remind her that she can wear this even during the daytime on days she can't get her compression sleeve on.   Consulted and Agree with Plan of Care Patient      Patient will benefit from skilled therapeutic intervention in order to improve the following deficits and impairments:  Increased edema, Obesity, Impaired UE functional use, Abnormal gait, Decreased balance  Visit Diagnosis: Lymphedema, not elsewhere classified - Plan: PT plan of care cert/re-cert  Muscle weakness (generalized) - Plan: PT plan of care cert/re-cert      G-Codes - 0000000 1715    Functional  Assessment Tool Used clinical judgement   Functional Limitation Self care   Self Care Current Status [id number]) At least 40 percent but less than 60 percent impaired, limited or restricted   Self Care Goal Status [id number]) At least 20 percent but less than 40 percent impaired, limited or restricted       Problem List Patient Active Problem List   Diagnosis Date Noted  . Fall 01/06/2017  . Port catheter in place 12/25/2016  . IBS (irritable bowel syndrome) 11/25/2016  . Breast cancer of lower-inner quadrant of right female breast ([place]) 06/27/2016  . OSA on CPAP 06/26/2016  . Type 2 diabetes mellitus without complication ([place]) [id number]  . Severe obesity (BMI >= 40) ([place]) 09/04/2014  . Essential hypertension 09/04/2014  . HLD (hyperlipidemia) 09/04/2014  . Gastroesophageal reflux disease without esophagitis 09/04/2014  . Asthma, mild persistent 09/04/2014  . Urge incontinence 09/04/2014  . Seasonal allergies 09/04/2014  . Generalized anxiety disorder 09/04/2014    [place],Tiffany Arnold 01/21/2017, 5:18 PM  [place], [place], [address] Phone: [phone]   Fax:  [phone]  Name: RASHEEMA BARSON MRN: [id number] Date of Birth: September 12, 1941   Serafina Royals, PT 01/21/17 5:18 PM

## 2017-01-22 ENCOUNTER — Telehealth: Payer: Self-pay | Admitting: *Deleted

## 2017-01-22 ENCOUNTER — Ambulatory Visit
Admission: RE | Admit: 2017-01-22 | Discharge: 2017-01-22 | Disposition: A | Discharge status: Home / Self Care | Payer: Medicare PPO | Source: Ambulatory Visit | Attending: Radiation Oncology | Admitting: Radiation Oncology

## 2017-01-22 ENCOUNTER — Ambulatory Visit: Payer: Medicare PPO | Attending: Radiation Oncology

## 2017-01-22 DIAGNOSIS — Z510 Encounter for antineoplastic radiation therapy: Secondary | ICD-10-CM | POA: Diagnosis not present

## 2017-01-22 DIAGNOSIS — Z170 Estrogen receptor positive status [ER+]: Secondary | ICD-10-CM | POA: Diagnosis not present

## 2017-01-22 DIAGNOSIS — Z87891 Personal history of nicotine dependence: Secondary | ICD-10-CM | POA: Diagnosis not present

## 2017-01-22 DIAGNOSIS — J45909 Unspecified asthma, uncomplicated: Secondary | ICD-10-CM | POA: Diagnosis not present

## 2017-01-22 DIAGNOSIS — C773 Secondary and unspecified malignant neoplasm of axilla and upper limb lymph nodes: Secondary | ICD-10-CM | POA: Diagnosis not present

## 2017-01-22 DIAGNOSIS — I890 Lymphedema, not elsewhere classified: Secondary | ICD-10-CM | POA: Insufficient documentation

## 2017-01-22 DIAGNOSIS — C50311 Malignant neoplasm of lower-inner quadrant of right female breast: Secondary | ICD-10-CM | POA: Diagnosis not present

## 2017-01-22 DIAGNOSIS — E785 Hyperlipidemia, unspecified: Secondary | ICD-10-CM | POA: Diagnosis not present

## 2017-01-22 DIAGNOSIS — Z86010 Personal history of colonic polyps: Secondary | ICD-10-CM | POA: Diagnosis not present

## 2017-01-22 NOTE — Therapy (Signed)
[place], [place], [address] Phone: [phone]   Fax:  [phone]  Physical Therapy Treatment  Patient Details  Name: Tiffany Arnold MRN: [id number] Date of Birth: [birth year] Referring Provider: Dr. Eppie Gibson  Encounter Date: 01/22/2017      PT End of Session - 01/22/17 1108    Visit Number 2   Number of Visits 9   Date for PT Re-Evaluation 02/27/17   PT Start Time 1020   PT Stop Time 1104   PT Time Calculation (min) 44 min   Activity Tolerance Patient tolerated treatment well   Behavior During Therapy [facility] for tasks assessed/performed      Past Medical History:  Diagnosis Date  . Allergy   . Arthritis   . Asthma   . Cancer ([place]) 07/29/2016   right breast  . Cataract of both eyes   . Chicken pox   . Colon polyps   . Dog bite of index finger 07/23/2016   Left index finger  . GERD (gastroesophageal reflux disease)   . Hyperlipidemia   . Hypertension   . IBS (irritable bowel syndrome)   . Phlebitis   . Pneumonia    hx of several yrs ago  . Shortness of breath dyspnea    with exertion only  . Sleep apnea    wears CPAP machine nightly    Past Surgical History:  Procedure Laterality Date  . BACK SURGERY     lower  . BREAST CYST EXCISION Right 2001   negative  . BREAST LUMPECTOMY WITH NEEDLE LOCALIZATION AND AXILLARY LYMPH NODE DISSECTION Right 07/29/2016   Procedure: RIGHT BREAST LUMPECTOMY WITH DOUBLE NEEDLE LOCALIZATION AND COMPLETE RIGHT AXILLARY LYMPH NODE DISSECTION;  Surgeon: Fanny Skates, MD;  Location: [facility];  Service: General;  Laterality: Right;  . BREAST SURGERY Left 2000   Biopsy  . BUNIONECTOMY Bilateral 1998   great toe fusion on right foot  . COLONOSCOPY W/ POLYPECTOMY    . HERNIA REPAIR  123456   [place] SINUS SURGERY  2015  . PORTACATH PLACEMENT N/A 09/05/2016   Procedure: INSERTION PORT-A-CATH;  Surgeon: Fanny Skates, MD;  [facility];   Service: General;  Laterality: N/A;  . REPLACEMENT TOTAL KNEE Bilateral 2007  . TOTAL SHOULDER REPLACEMENT Left 2007    There were no vitals filed for this visit.      Subjective Assessment - 01/22/17 1024    Subjective I got a call this morning asking me to come early (@ 1000) and I thought it was you guys but it must have been the cancer center. I had a bout of nauesous this morning but it passed.  Other than that nothing new since I was here yesterday.   Pertinent History Right breast lumpectomy with 16 lymph nodes removed on July 29, 2016. Had chemotherapy.  Started XRT 12/24/16 and should finish it 02/03/17.  Has had bilateral knee replacements within the past 5 years. Had left shoulder replaced. Patient has been seen here about three months ago.   Patient Stated Goals get the arm to reduce   Currently in Pain? No/denies               LYMPHEDEMA/ONCOLOGY QUESTIONNAIRE - 01/21/17 1140      Type   Cancer Type right breast     Treatment   Past Chemotherapy Treatment Yes   Active Radiation Treatment Yes   Date 12/24/16     Right Upper Extremity Lymphedema  15 cm Proximal to Olecranon Process 43 cm   10 cm Proximal to Olecranon Process 41 cm   Olecranon Process 30 cm   15 cm Proximal to Ulnar Styloid Process 30.4 cm   10 cm Proximal to Ulnar Styloid Process 28.8 cm   Just Proximal to Ulnar Styloid Process 19.8 cm   Across Hand at [employer] 18.9 cm   At [place] of 2nd Digit 6.3 cm                  [facility] Adult PT Treatment/Exercise - 01/22/17 0001      Self-Care   Other Self-Care Comments  Reviewed with pt and instructed husband in proper donning of pts compression sleeve and velcro compression garment having him practice with therapist observing.      Manual Therapy   Manual Lymphatic Drainage (MLD) In Supine: Short neck, 5 diaphragmatic breaths, Rt inguinal nodes and Rt axillo-inguinal anastomosis, Lt axillary nodes and anterior inter-axillary  anastomosis, and Rt UE from dorsal hand to lateral shoulder working proximal from distal then retracing all steps.                PT Education - 01/22/17 1107    Education provided Yes   Education Details Reviewed with pt and instructed husband in proper donning of compression sleeve and velcro compression garment having him return demonstration. He did well with compressio nsleeve, may need review of proper way to tighten straps on velcro garment.   Person(s) Educated Patient   Methods Explanation;Demonstration   Comprehension Verbalized understanding;Returned demonstration;Need further instruction                [facility] Goals - 01/21/17 1712      CC Long Term Goal  #8   Title Patient will be knowledgeable about best use of her day- and nighttime compression garments (including using velcro garment in daytime when needed)   Time 4   Period Weeks   Status New     Additional Goals   Additional Goals Yes     Additional Goal #1   Title Right arm circumference at 10 cm. proximal to ulnar styloid will reduce to 28 cm. or less   Baseline 28.8 at time of re-eval on 01/21/17   Time 4   Period Weeks   Status New            Plan - 01/22/17 1108    Clinical Impression Statement Pt was reporting some nauesous today but reports this has been the normal for hre lately with radiation, just not having much of an appetite either. She did well with manual lymph drainage and then brought her husband in to instruct in proper donning of compression garments. He did very well with compression sleeve, may need further review of velcro garment. Also reminded pt velcro garment can be worn during day if unable to get sleeve on.   Rehab Potential Good   Clinical Impairments Affecting Rehab Potential previous total joint replacements in other joints with generalized weakness, obesity; caution of left total shoulder replacement  16 lymph nodes removed from right axilla    PT  Frequency 2x / week   PT Duration 4 weeks   PT Treatment/Interventions ADLs/Self Care Home Management;DME Instruction;Gait training;Functional mobility training;Therapeutic activities;Therapeutic exercise;Balance training;Neuromuscular re-education;Patient/family education;Manual techniques;Manual lymph drainage;Compression bandaging;Taping   PT Next Visit Plan Focus on manual lymph drainage, review with husband prn donning of compression garments.    Consulted and Agree with Plan of  Care Patient;Family member/caregiver   Family Member Consulted Husband, Joneen Caraway      Patient will benefit from skilled therapeutic intervention in order to improve the following deficits and impairments:  Increased edema, Obesity, Impaired UE functional use, Abnormal gait, Decreased balance  Visit Diagnosis: Lymphedema, not elsewhere classified       G-Codes - Feb 01, 2017 1715    Functional Assessment Tool Used clinical judgement   Functional Limitation Self care   Self Care Current Status [id number]) At least 40 percent but less than 60 percent impaired, limited or restricted   Self Care Goal Status [id number]) At least 20 percent but less than 40 percent impaired, limited or restricted      Problem List Patient Active Problem List   Diagnosis Date Noted  . Fall 01/06/2017  . Port catheter in place 12/25/2016  . IBS (irritable bowel syndrome) 11/25/2016  . Breast cancer of lower-inner quadrant of right female breast ([place]) 06/27/2016  . OSA on CPAP 06/26/2016  . Type 2 diabetes mellitus without complication ([place]) 123XX123  . Severe obesity (BMI >= 40) ([place]) 09/04/2014  . Essential hypertension 09/04/2014  . HLD (hyperlipidemia) 09/04/2014  . Gastroesophageal reflux disease without esophagitis 09/04/2014  . Asthma, mild persistent 09/04/2014  . Urge incontinence 09/04/2014  . Seasonal allergies 09/04/2014  . Generalized anxiety disorder 09/04/2014    Otelia Limes, PTA 01/22/2017, 11:13  AM  [place], [place], [address] Phone: [phone]   Fax:  [phone]  Name: IKESHIA GUSSLER MRN: [id number] Date of Birth: 08-19-1941

## 2017-01-22 NOTE — Telephone Encounter (Signed)
Called patient to remind of PT for today- arrival time - 10 am, spoke with patient and she is aware of this appt.

## 2017-01-23 ENCOUNTER — Ambulatory Visit
Admission: RE | Admit: 2017-01-23 | Discharge: 2017-01-23 | Disposition: A | Discharge status: Home / Self Care | Payer: Medicare PPO | Source: Ambulatory Visit | Attending: Radiation Oncology | Admitting: Radiation Oncology

## 2017-01-23 DIAGNOSIS — E785 Hyperlipidemia, unspecified: Secondary | ICD-10-CM | POA: Diagnosis not present

## 2017-01-23 DIAGNOSIS — C773 Secondary and unspecified malignant neoplasm of axilla and upper limb lymph nodes: Secondary | ICD-10-CM | POA: Diagnosis not present

## 2017-01-23 DIAGNOSIS — Z87891 Personal history of nicotine dependence: Secondary | ICD-10-CM | POA: Diagnosis not present

## 2017-01-23 DIAGNOSIS — Z86010 Personal history of colonic polyps: Secondary | ICD-10-CM | POA: Diagnosis not present

## 2017-01-23 DIAGNOSIS — Z510 Encounter for antineoplastic radiation therapy: Secondary | ICD-10-CM | POA: Diagnosis not present

## 2017-01-23 DIAGNOSIS — Z170 Estrogen receptor positive status [ER+]: Secondary | ICD-10-CM | POA: Diagnosis not present

## 2017-01-23 DIAGNOSIS — J45909 Unspecified asthma, uncomplicated: Secondary | ICD-10-CM | POA: Diagnosis not present

## 2017-01-23 DIAGNOSIS — C50311 Malignant neoplasm of lower-inner quadrant of right female breast: Secondary | ICD-10-CM | POA: Diagnosis not present

## 2017-01-23 DIAGNOSIS — I890 Lymphedema, not elsewhere classified: Secondary | ICD-10-CM | POA: Diagnosis not present

## 2017-01-26 ENCOUNTER — Ambulatory Visit
Admission: RE | Admit: 2017-01-26 | Discharge: 2017-01-26 | Disposition: A | Discharge status: Home / Self Care | Payer: Medicare PPO | Source: Ambulatory Visit | Attending: Radiation Oncology | Admitting: Radiation Oncology

## 2017-01-26 ENCOUNTER — Encounter: Payer: Self-pay | Admitting: Radiation Oncology

## 2017-01-26 VITALS — BP 123/63 | HR 67 | Temp 97.7°F | Resp 18 | Ht 61.0 in | Wt 212.4 lb

## 2017-01-26 DIAGNOSIS — Z170 Estrogen receptor positive status [ER+]: Secondary | ICD-10-CM | POA: Diagnosis not present

## 2017-01-26 DIAGNOSIS — E785 Hyperlipidemia, unspecified: Secondary | ICD-10-CM | POA: Diagnosis not present

## 2017-01-26 DIAGNOSIS — C50311 Malignant neoplasm of lower-inner quadrant of right female breast: Secondary | ICD-10-CM | POA: Diagnosis not present

## 2017-01-26 DIAGNOSIS — I890 Lymphedema, not elsewhere classified: Secondary | ICD-10-CM | POA: Diagnosis not present

## 2017-01-26 DIAGNOSIS — C773 Secondary and unspecified malignant neoplasm of axilla and upper limb lymph nodes: Secondary | ICD-10-CM | POA: Diagnosis not present

## 2017-01-26 DIAGNOSIS — Z87891 Personal history of nicotine dependence: Secondary | ICD-10-CM | POA: Diagnosis not present

## 2017-01-26 DIAGNOSIS — J45909 Unspecified asthma, uncomplicated: Secondary | ICD-10-CM | POA: Diagnosis not present

## 2017-01-26 DIAGNOSIS — Z86010 Personal history of colonic polyps: Secondary | ICD-10-CM | POA: Diagnosis not present

## 2017-01-26 DIAGNOSIS — Z510 Encounter for antineoplastic radiation therapy: Secondary | ICD-10-CM | POA: Diagnosis not present

## 2017-01-26 NOTE — Progress Notes (Signed)
Tiffany Arnold presents for her 22th fraction of radiation to her Right Breast and Axilla. She denies pain or fatigue. Her Right Breast is red and slightly swollen. She has redness to the area underneath her Right Breast. She is using Radiaplex twice daily as directed. She is having occasional diarrhea and has been experiencing it since her chemotherapy at times none since last Monday.  Appetite is good. Wt Readings from Last 3 Encounters:  01/26/17 212 lb 6.4 oz (96.3 kg)  01/19/17 212 lb 12.8 oz (96.5 kg)  01/12/17 217 lb (98.4 kg)  BP 123/63   Pulse 67   Temp 97.7 F (36.5 C) (Oral)   Resp 18   Ht 5\' 1"  (1.549 m)   Wt 212 lb 6.4 oz (96.3 kg)   SpO2 100%   BMI 40.13 kg/m

## 2017-01-26 NOTE — Progress Notes (Signed)
   Weekly Management Note:  Outpatient    ICD-9-CM ICD-10-CM   1. Malignant neoplasm of lower-inner quadrant of right breast of female, estrogen receptor positive (HCC) 174.3 C50.311    V86.0 Z17.0     Current Dose: 39.6 Gy  Projected Dose: 50.4 Gy   Narrative:  The patient presents for routine under treatment assessment.  CBCT/MVCT images/Port film x-rays were reviewed.  The chart was checked. Doing well, following with PT.  Physical Findings:  height is 5\' 1"  (1.549 m) and weight is 212 lb 6.4 oz (96.3 kg). Her oral temperature is 97.7 F (36.5 C). Her blood pressure is 123/63 and her pulse is 67. Her respiration is 18 and oxygen saturation is 100%.   Wt Readings from Last 3 Encounters:  01/26/17 212 lb 6.4 oz (96.3 kg)  01/19/17 212 lb 12.8 oz (96.5 kg)  01/12/17 217 lb (98.4 kg)  Bright erythema of right breast, skin is starting to peel in a small area of IM fold <1cm  Impression:  The patient is tolerating radiotherapy.  Plan:  Continue radiotherapy as planned. Patient instructed to apply Radiplex to intact skin in treatment fields and neosporin instead at IM fold. Continue PT for progressive lymphedema of RUE since discharge and difficulty applying sleeve.   ________________________________   Eppie Gibson, M.D.

## 2017-01-27 ENCOUNTER — Ambulatory Visit: Payer: Medicare PPO | Admitting: Physical Therapy

## 2017-01-27 ENCOUNTER — Ambulatory Visit
Admission: RE | Admit: 2017-01-27 | Discharge: 2017-01-27 | Disposition: A | Discharge status: Home / Self Care | Payer: Medicare PPO | Source: Ambulatory Visit | Attending: Radiation Oncology | Admitting: Radiation Oncology

## 2017-01-27 DIAGNOSIS — C773 Secondary and unspecified malignant neoplasm of axilla and upper limb lymph nodes: Secondary | ICD-10-CM | POA: Diagnosis not present

## 2017-01-27 DIAGNOSIS — I890 Lymphedema, not elsewhere classified: Secondary | ICD-10-CM | POA: Diagnosis not present

## 2017-01-27 DIAGNOSIS — Z510 Encounter for antineoplastic radiation therapy: Secondary | ICD-10-CM | POA: Diagnosis not present

## 2017-01-27 DIAGNOSIS — Z86010 Personal history of colonic polyps: Secondary | ICD-10-CM | POA: Diagnosis not present

## 2017-01-27 DIAGNOSIS — J45909 Unspecified asthma, uncomplicated: Secondary | ICD-10-CM | POA: Diagnosis not present

## 2017-01-27 DIAGNOSIS — Z170 Estrogen receptor positive status [ER+]: Secondary | ICD-10-CM | POA: Diagnosis not present

## 2017-01-27 DIAGNOSIS — C50311 Malignant neoplasm of lower-inner quadrant of right female breast: Secondary | ICD-10-CM | POA: Diagnosis not present

## 2017-01-27 DIAGNOSIS — E785 Hyperlipidemia, unspecified: Secondary | ICD-10-CM | POA: Diagnosis not present

## 2017-01-27 DIAGNOSIS — Z87891 Personal history of nicotine dependence: Secondary | ICD-10-CM | POA: Diagnosis not present

## 2017-01-27 NOTE — Progress Notes (Signed)
[name]  [phone] [phone]  Clinic Follow Up Note   Patient Care Team: [name], NP as PCP - General (Internal Medicine) 01/29/2017   CHIEF COMPLAINTS:  Follow Up right breast cancer  . Oncology History   Breast cancer of lower-inner quadrant of right female breast [facility])   Staging form: Breast, AJCC 7th Edition   - Clinical stage from 06/09/2016: Stage IIB (T2, N1, M0) - Signed by Truitt Merle, MD on 06/27/2016   - Pathologic stage from 07/29/2016: Stage IIA (T1c, N1a, cM0) - Signed by Truitt Merle, MD on 08/13/2016       Breast cancer of lower-inner quadrant of right female breast ([place])   05/23/2016 Mammogram    Diagnostic mammogram and ultrasound showed suspicious architectural distortion within the right breast lower inner quadrant, measuring 1.3 cm, without sonographic corelate.      06/03/2016 Initial Biopsy    Right breast inferior lower quadrant core needle biopsy showed atypical ductal hyperplasia with calcifications      06/09/2016 Receptors her2    Breast biopsy showed ER 100% positive, PR 100% positive, HER-2 negative, Ki-67 40%      06/09/2016 Initial Diagnosis    Breast cancer of lower-inner quadrant of right female breast ([place])      06/09/2016 Initial Biopsy    Right breast inner quadrant core needle biopsy showed invasive ductal carcinoma and DCIS, grade 1-2      06/17/2016 Initial Biopsy    Right axillary lymph node core needle biopsy showed metastatic carcinoma      06/17/2016 Receptors her2    Axillary node biopsy showed ER 100% positive, PR 95% positive, HER-2 negative      06/19/2016 Imaging    Bilateral breast MRI showed locations in the lower inner right breast, largest 2.7X1.3X1.6cm, biopsy clips in 2 of this areas. There are abnormal right axillary lymph nodes showing second cortical's, no evidence of malignancy in the left breast.      07/29/2016 Surgery    Right lumpectomy and ALND      07/29/2016  Pathology Results    Right lumpectomy showed G3 IDC, DCIS, margins (-), LVI(-).  1 of 16 nodes was positive       07/29/2016 Miscellaneous    Mammaprint showed high risk disease, luminal type B      09/09/2016 - 11/12/2016 Adjuvant Chemotherapy    Docetaxel and Cytoxan (TC) every 3 weeks      12/24/2016 -  Radiation Therapy    Adjuvant breast radiaiton       HISTORY OF PRESENTING ILLNESS:  Tiffany Arnold 76 y.o. female is here because of her recently diagnosed left breast cancer. She presents to my clinic with her friend, who is a Marine scientist.   Her cancer was discovered by screening mammogram. She had a right breast cyst in 2001, which was removed. She has been doing mammogram once a year. The mammogram and ultrasound on 05/23/2016 showed a suspicious architectural this portion, 1.3 cm, without sonographic correlation. She underwent core needle biopsy of the right breast mass twice and right axilla node biopsy, one breast biopsy and node biopsy showed invasive ductal carcinoma and DCIS, ER/PR strong positive, HER-2 negative.  She denies any other new symptoms. She has noticed mild fatigued lately, she still works full time in a vet's office, she has IBS, has intermittent constipation and diarrhea. She has arthritis, and both knee replacement and shoulder surgery before, she also has some back pain lately, she takes  tylenol occasionally.  She lives with her husband, moderately active. No family history of breast cancer  GYN HISTORY  Menarchal: 13 LMP: 39 Contraceptive: 4-5 years HRT: 3 years  G2P2: no breast feeding, daughter 80 yo and son is 71 yo.   CURRENT THERAPY: Adjuvant Breast radiation  INTERIM HISTORY: Tiffany Arnold returns for follow up. She has been doing well. She is done with radiation on Tuesday, 02/03/17. She has occasional nausea that eventually goes away. She has been doing well with radiation. She no longer has diarrhea from radiation. Pt uses a walker because she was having  falls and occasional dizziness, and it has helped. Denies fatigue, pain, or any other concerns.   MEDICAL HISTORY:  Past Medical History:  Diagnosis Date  . Allergy   . Arthritis   . Asthma   . Cancer ([place]) 07/29/2016   right breast  . Cataract of both eyes   . Chicken pox   . Colon polyps   . Dog bite of index finger 07/23/2016   Left index finger  . GERD (gastroesophageal reflux disease)   . Hyperlipidemia   . Hypertension   . IBS (irritable bowel syndrome)   . Phlebitis   . Pneumonia    hx of several yrs ago  . Shortness of breath dyspnea    with exertion only  . Sleep apnea    wears CPAP machine nightly    SURGICAL HISTORY: Past Surgical History:  Procedure Laterality Date  . BACK SURGERY     lower  . BREAST CYST EXCISION Right 2001   negative  . BREAST LUMPECTOMY WITH NEEDLE LOCALIZATION AND AXILLARY LYMPH NODE DISSECTION Right 07/29/2016   Procedure: RIGHT BREAST LUMPECTOMY WITH DOUBLE NEEDLE LOCALIZATION AND COMPLETE RIGHT AXILLARY LYMPH NODE DISSECTION;  Surgeon: Fanny Skates, MD;  Location: [facility];  Service: General;  Laterality: Right;  . BREAST SURGERY Left 2000   Biopsy  . BUNIONECTOMY Bilateral 1998   great toe fusion on right foot  . COLONOSCOPY W/ POLYPECTOMY    . HERNIA REPAIR  [id number]   [place] SINUS SURGERY  2015  . PORTACATH PLACEMENT N/A 09/05/2016   Procedure: INSERTION PORT-A-CATH;  Surgeon: Fanny Skates, MD;  Location: [facility];  Service: General;  Laterality: N/A;  . REPLACEMENT TOTAL KNEE Bilateral 2007  . TOTAL SHOULDER REPLACEMENT Left 2007    SOCIAL HISTORY: Social History   Social History  . Marital status: Married    Spouse name: N/A  . Number of children: N/A  . Years of education: N/A   Occupational History  . Not on file.   Social History Main Topics  . Smoking status: Former Smoker    Packs/day: 2.00    Years: 25.00    Quit date: 12/22/1990  . Smokeless tobacco: Never Used     Comment: quit 24 years ago  .  Alcohol use Yes     Comment: rare  . Drug use: No  . Sexual activity: Not Currently   Other Topics Concern  . Not on file   Social History Narrative  . No narrative on file    FAMILY HISTORY: Family History  Problem Relation Age of Onset  . Arthritis Mother   . Stroke Mother   . Hypertension Mother   . Cancer Father     Prostate  . Stroke Father   . Hypertension Father   . Hypertension Maternal Grandmother   . Rheum arthritis Maternal Grandfather   . Stroke Maternal Grandfather   .  Hypertension Maternal Grandfather   . Cancer Paternal Grandmother     Colon  . Hypertension Paternal Grandmother   . Hypertension Paternal Grandfather   . Breast cancer Neg Hx     ALLERGIES:  is allergic to other.  MEDICATIONS:  Current Outpatient Prescriptions  Medication Sig Dispense Refill  . albuterol-ipratropium (COMBIVENT) 18-103 MCG/ACT inhaler Inhale 2 puffs into the lungs every 4 (four) hours as needed for wheezing or shortness of breath.     . ALPRAZolam (XANAX) 0.25 MG tablet TAKE ONE TABLET BY MOUTH TWICE DAILY AS NEEDED 60 tablet 0  . Calcium Carbonate-Vitamin D (CALCIUM-VITAMIN D) 500-200 MG-UNIT tablet Take 1 tablet by mouth daily.    . cetirizine (ZYRTEC) 10 MG tablet Take 10 mg by mouth daily.    . Cholecalciferol (VITAMIN D3) 1000 units CAPS Take by mouth 2 (two) times daily.    . diphenoxylate-atropine (LOMOTIL) 2.5-0.025 MG tablet Take 2 tablets by mouth 4 (four) times daily as needed for diarrhea or loose stools. (Patient not taking: Reported on 01/26/2017) 30 tablet 0  . EPINEPHrine 0.3 mg/0.3 mL IJ SOAJ injection Inject 0.3 mg into the muscle once as needed (ALLERGIC REACTION).     Marland Kitchen glipiZIDE (GLUCOTROL) 10 MG tablet Take 1 tablet (10 mg total) by mouth 2 (two) times daily before a meal. 180 tablet 1  . glucosamine-chondroitin 500-400 MG tablet Take 1 tablet by mouth 2 (two) times daily.    Marland Kitchen HYDROcodone-acetaminophen (NORCO/VICODIN) 5-325 MG tablet Take 1-2 tablets by  mouth every 4 (four) hours as needed. (Patient not taking: Reported on 01/05/2017) 50 tablet 0  . letrozole (FEMARA) 2.5 MG tablet Take 1 tablet (2.5 mg total) by mouth daily. 30 tablet 2  . lidocaine-prilocaine (EMLA) cream Apply to port at least 1 hour prior to chemo (Patient not taking: Reported on 01/26/2017) 30 g 1  . magic mouthwash SOLN Swish and Spit 5-10 mLs 4 times a day as needed. (Patient not taking: Reported on 01/05/2017) 240 mL 1  . metFORMIN (GLUCOPHAGE) 1000 MG tablet     . Olmesartan-Amlodipine-HCTZ 40-5-25 MG TABS Take 1 tablet by mouth daily. 90 tablet 1  . Omega-3 Fatty Acids (FISH OIL) 1000 MG CAPS Take 1,000 mg by mouth daily.    . ondansetron (ZOFRAN) 8 MG tablet TAKE ONE TABLET BY MOUTH TWICE DAILY AS NEEDED FOR  REFRACTORY  NAUSEA/VOMITING.  START  ON  DAY  3  AFTER  CHEMO. (Patient not taking: Reported on 01/05/2017) 30 tablet 1  . oxybutynin (DITROPAN-XL) 10 MG 24 hr tablet Take 1 tablet (10 mg total) by mouth at bedtime. 90 tablet 1  . pravastatin (PRAVACHOL) 40 MG tablet Take 1 tablet (40 mg total) by mouth daily. 90 tablet 1  . prochlorperazine (COMPAZINE) 10 MG tablet TAKE ONE TABLET BY MOUTH EVERY 6 HOURS AS NEEDED FOR NAUSEA AND VOMITING (Patient not taking: Reported on 01/05/2017) 30 tablet 1  . ranitidine (ZANTAC) 150 MG tablet Take 150 mg by mouth every evening.     . sertraline (ZOLOFT) 25 MG tablet Take 1 tablet (25 mg total) by mouth daily. 30 tablet 2  . vitamin B-12 (CYANOCOBALAMIN) 1000 MCG tablet Take 1,000 mcg by mouth daily.     No current facility-administered medications for this visit.     REVIEW OF SYSTEMS:   Constitutional: Denies fevers, chills or abnormal night sweats (+) falls Eyes: Denies blurriness of vision, double vision or watery eyes Ears, nose, mouth, throat, and face: Denies mucositis or sore throat  Respiratory: Denies cough, dyspnea or wheezes Cardiovascular: Denies palpitation, chest discomfort or lower extremity  swelling Gastrointestinal:  Denies heartburn or change in bowel habits (+) nausea Skin: Denies abnormal skin rashes Lymphatics: Denies new lymphadenopathy or easy bruising Neurological:Denies numbness, tingling or new weaknesses (+) dizziness Behavioral/Psych: Mood is stable, no new changes  All other systems were reviewed with the patient and are negative.  PHYSICAL EXAMINATION: ECOG PERFORMANCE STATUS: 1  Vitals:   01/29/17 1320  BP: 127/77  Pulse: 81  Resp: 18  Temp: 98 F (36.7 C)   Filed Weights   01/29/17 1320  Weight: 214 lb 4.8 oz (97.2 kg)   GENERAL:alert, no distress and comfortable SKIN: skin color, texture, turgor are normal, no rashes or significant lesions EYES: normal, conjunctiva are pink and non-injected, sclera clear OROPHARYNX:no exudate, no erythema and lips, buccal mucosa, and tongue normal  NECK: supple, thyroid normal size, non-tender, without nodularity LYMPH:  no palpable lymphadenopathy in the cervical, axillary or inguinal LUNGS: clear to auscultation and percussion with normal breathing effort HEART: regular rate & rhythm and no murmurs and no lower extremity edema ABDOMEN:abdomen soft, non-tender and normal bowel sounds Musculoskeletal:no cyanosis of digits and no clubbing  PSYCH: alert & oriented x 3 with fluent speech NEURO: no focal motor/sensory deficits Breasts: Breast inspection showed them to be symmetrical with no nipple discharge. (+) Diffuse skin erythema of the right breast, mild skin peeling at the low part of right breast, Palpation of the breasts and axilla revealed no obvious mass that I could appreciate.    LABORATORY DATA:  I have reviewed the data as listed CBC Latest Ref Rng & Units 01/29/2017 12/24/2016 11/25/2016  WBC 3.9 - 10.3 10e3/uL 6.3 10.4(H) 21.8(H)  Hemoglobin 11.6 - 15.9 g/dL 12.3 12.3 11.4(L)  Hematocrit 34.8 - 46.6 % 38.9 38.5 36.2  Platelets 145 - 400 10e3/uL 265 346 262   CMP Latest Ref Rng & Units 01/29/2017  12/24/2016 11/25/2016  Glucose 70 - 140 mg/dl 88 109 119  BUN 7.0 - 26.0 mg/dL 17.0 12.5 27.9(H)  Creatinine 0.6 - 1.1 mg/dL 0.8 0.8 0.9  Sodium 136 - 145 mEq/L 140 137 140  Potassium 3.5 - 5.1 mEq/L 3.9 4.1 4.1  Chloride 101 - 111 mmol/L - - -  CO2 22 - 29 mEq/L 28 28 26   Calcium 8.4 - 10.4 mg/dL 10.7(H) 11.1(H) 9.8  Total Protein 6.4 - 8.3 g/dL 6.6 7.1 5.8(L)  Total Bilirubin 0.20 - 1.20 mg/dL 0.35 0.64 0.31  Alkaline Phos 40 - 150 U/L 62 73 104  AST 5 - 34 U/L 14 21 12   ALT 0 - 55 U/L 14 19 18     PATHOLOGY REPORT  Diagnosis 06/03/2016 Breast, right, needle core biopsy, ILQ focal 1.3 cm asymmetry/distortion - ATYPICAL DUCTAL HYPERPLASIA WITH CALCIFICATIONS. - FIBROCYSTIC CHANGES WITH CALCIFICATIONS. - SEE COMMENT. Microscopic Comment The results were called to [facility] on 06/04/16. (JBK:ds 06/04/16)   Diagnosis 06/09/2016 Breast, right, needle core biopsy, inner - INVASIVE DUCTAL CARCINOMA. - DUCTAL CARCINOMA IN SITU. - SEE COMMENT. Microscopic Comment The carcinoma appears grade 1-2. A breast prognostic profile will be performed and the results reported separately. The results were called to [facility] on 06/10/2016. (JBK:ecj 06/10/2016) Results: HER2 - NEGATIVE RATIO OF HER2/CEP17 SIGNALS 1.55 AVERAGE HER2 COPY NUMBER PER CELL 2.25  Results: IMMUNOHISTOCHEMICAL AND MORPHOMETRIC ANALYSIS PERFORMED MANUALLY Estrogen Receptor: 100%, POSITIVE, STRONG STAINING INTENSITY Progesterone Receptor: 100%, POSITIVE, STRONG STAINING INTENSITY Proliferation Marker Ki67: 40%  Diagnosis 06/17/2016 Lymph node, needle/core biopsy, right, inferior, axilla to far lateral breast - METASTATIC CARCINOMA, SEE COMMENT. Microscopic Comment The morphology is consistent with the patient breast carcinoma. Prognostic markers will be ordered and reported in an addendum. The case was called to [facility] on  06/18/2016. Results: IMMUNOHISTOCHEMICAL AND MORPHOMETRIC ANALYSIS PERFORMED MANUALLY Estrogen Receptor: 100%, POSITIVE, STRONG STAINING INTENSITY Progesterone Receptor: 95%, POSITIVE, STRONG STAINING INTENSITY  Results: HER2 - NEGATIVE RATIO OF HER2/CEP17 SIGNALS 1.25 AVERAGE HER2 COPY NUMBER PER CELL 2.00  Diagnosis 07/29/2016 1. Breast, lumpectomy, Right INVASIVE DUCTAL CARCINOMA, GRADE 3, SPANNING 1.5 CM DUCTAL CARCINOMA IN SITU IS PRESENT ALL MARGINS OF RESECTION ARE NEGATIVE FOR CARCINOMA 2. Lymph nodes, regional resection, Right axillary contents METASTATIC BREAST DUCTAL CARCINOMA IN ONE OF SIXTEEN LYMPH NODES (1/16) Specimen, including laterality and lymph node sampling (sentinel, non-sentinel): Right partial breast and regional lymph nodes Procedure: Lumpectomy Histologic type: Ductal carcinoma Grade: 3 Tubule formation: 3 Nuclear pleomorphism: 2 Mitotic:3 Tumor size (gross measurement or glass slide measurement): 1.5 cm Margins: Invasive, distance to closest margin: 0.7 cm In-situ, distance to closest margin: 0.7 cm If margin positive, focally or broadly: NA Lymphovascular invasion: Not identified Ductal carcinoma in situ: Present Grade: 3 Extensive intraductal component: moderate Lobular neoplasia: Negative Tumor focality: Focal Treatment effect: Negative If present, treatment effect in breast tissue, lymph nodes or both: NA Extent of tumor: Skin: Negative Nipple: Negative Skeletal muscle: Negative Lymph nodes: Examined: 0 Sentinel 16 Non-sentinel 16 Total Lymph nodes with metastasis: 1 Isolated tumor cells (< 0.2 mm): 0 Micrometastasis: (> 0.2 mm and < 2.0 mm): 0 Macrometastasis: (> 2.0 mm): 1 Extracapsular extension: Present Breast prognostic profile: Estrogen receptor: 100% Progesterone receptor: 100% Her 2 neu: Negative Ki-67: 40% Non-neoplastic breast: Unremarkable TNM: pT1c, pN1  RADIOGRAPHIC STUDIES: I have personally reviewed the  radiological images as listed and agreed with the findings in the report. No results found. Diagnostic mammogram and ultrasound of right breast including right axillary 05/23/2016 IMPRESSION: Suspicious architectural distortion within the lower inner quadrant of the right breast, measuring 1.3 cm greatest dimension, without sonographic correlate. This distortion could conceivably be related to the patient's earlier surgical excision biopsy perform in 2001 (patient states that this earlier surgical excision was in this same region of her right breast), however, exclusion of a neoplastic cause is needed. As such, stereotactic-guided biopsy, with 3D tomosynthesis, is recommended for this suspicious finding.  US Venous Img Upper Uni Right 10/09/2016 IMPRESSION: No evidence of DVT within the right upper extremity.  ASSESSMENT & PLAN: 76 y.o.Caucasian female, with mammogram discovered right breast cancer.  1. 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  -We previously reviewed her mammaprint genomic test result, which showed luminal type B, high risk, average 10-year risk of recurrence without adjuvant therapy is 29% -given the high risk disease, I strongly recommend her to consider adjuvant chemo. She is relatively elderly, with some medical comorbilities, but still has good PS, so I recommend adjuvant docetaxel and cytoxan every 3 weeks for 4 cycles   -the goal of chemo is curative  -She has completed adjuvant chemotherapy -lab reviewed. She is clinically doing well. -She will complete radiation 02/03/2017. -Given the strong ER and PR positivity, I do  recommend adjuvant aromatase inhibitor to reduce her risk of  cancer recurrence,  The potential benefit and side effects, which includes but not limited to, hot flash, skin and vaginal dryness, metabolic changes ( increased blood glucose, cholesterol, weight, etc.), slightly in increased risk of cardiovascular disease, cataracts, muscular and joint discomfort, osteopenia and osteoporosis, etc, were discussed with her in great details. She is interested, and we'll start after she completes radiation. She does have moderate arthralgia, if she has worsening arthralgia was at aromatase inhibitor, I'll switch her to tamoxifen. -We previously discussed her breast cancer surveillance. She will continue continue screening mammogram, self exam, and routine follow-up with [place] for labs and exam. -I encouraged her to have healthy diet and exercise regularly, and try to lose some weight after she completes radiation  2. Hyperglycemia -She had borderline hyperglycemia before. Her random glucose has been 180-200, this is likely steroids induced hyperglycemia. -I strongly encouraged her follow-up with her primary care physician. -Her blood glucose has improved after she completed chemotherapy  3. Right upper extremity lymphedema -She is quite significant right upper extremity lymphedema after the axillary node dissection -Continue physical therapy, and wear sleeves   4. Hypertension and arthritis -She will continue medication and follow-up with her primary care physician -We previously discussed her that chemotherapy may affect her oral intake and her blood pressure, she is on 3 blood pressure medications including HCTZ, I strongly encouraged her to monitor her blood pressure at home, and we may need to adjust her medication if her blood pressure drops. -We discussed how the AI could effect her arthritis.   5. Morbid obesity -I encouraged her to have healthy diet and exercise regularly, try to lose some weight.  6. Loose stool, IBS, and bowel incontinence -I encouraged  the patient to begin taking probiotics and imodium to manage her bowel symptoms -I also encouraged her to try pelvic exercises for incontinence -She does not follow with GI for IBS. I encouraged her to follow with GI if this worsens  7. Bone Health -We discussed how the AI could effect her bones.  -Her last bone density scan was 2-3 years ago -I have ordered another.   Plan -Start Letrozole 2 weeks after radiation.  -Bone density scan at GI in the next few months -Return to clinic in 2 months.    All questions were answered. The patient knows to call the clinic with any problems, questions or concerns.  I spent 20 minutes counseling the patient face to face. The total time spent in the appointment was 25 minutes and more than 50% was on counseling.  This document serves as a record of services personally performed by Truitt Merle, MD. It was created on her behalf by Martinique Casey, a trained medical scribe. The creation of this record is based on the scribe's personal observations and the provider's statements to them. This document has been checked and approved by the attending provider.  I have reviewed the above documentation for accuracy and completeness and I agree with the above.    Truitt Merle, MD 01/29/2017

## 2017-01-27 NOTE — Therapy (Signed)
[place], [place], [address] Phone: [phone]   Fax:  [phone]  Physical Therapy Treatment  Patient Details  Name: Tiffany Arnold MRN: [id number] Date of Birth: [birth year] Referring Provider: Dr. Eppie Gibson  Encounter Date: 01/27/2017      PT End of Session - 01/27/17 1505    Visit Number 3   Number of Visits 9   Date for PT Re-Evaluation 02/27/17   PT Start Time [id number]   PT Stop Time 1426   PT Time Calculation (min) 38 min   Activity Tolerance Patient tolerated treatment well   Behavior During Therapy [facility] for tasks assessed/performed      Past Medical History:  Diagnosis Date  . Allergy   . Arthritis   . Asthma   . Cancer ([place]) 07/29/2016   right breast  . Cataract of both eyes   . Chicken pox   . Colon polyps   . Dog bite of index finger 07/23/2016   Left index finger  . GERD (gastroesophageal reflux disease)   . Hyperlipidemia   . Hypertension   . IBS (irritable bowel syndrome)   . Phlebitis   . Pneumonia    hx of several yrs ago  . Shortness of breath dyspnea    with exertion only  . Sleep apnea    wears CPAP machine nightly    Past Surgical History:  Procedure Laterality Date  . BACK SURGERY     lower  . BREAST CYST EXCISION Right 2001   negative  . BREAST LUMPECTOMY WITH NEEDLE LOCALIZATION AND AXILLARY LYMPH NODE DISSECTION Right 07/29/2016   Procedure: RIGHT BREAST LUMPECTOMY WITH DOUBLE NEEDLE LOCALIZATION AND COMPLETE RIGHT AXILLARY LYMPH NODE DISSECTION;  Surgeon: Fanny Skates, MD;  Location: [facility];  Service: General;  Laterality: Right;  . BREAST SURGERY Left 2000   Biopsy  . BUNIONECTOMY Bilateral 1998   great toe fusion on right foot  . COLONOSCOPY W/ POLYPECTOMY    . HERNIA REPAIR  [id number]   [place] SINUS SURGERY  2015  . PORTACATH PLACEMENT N/A 09/05/2016   Procedure: INSERTION PORT-A-CATH;  Surgeon: Fanny Skates, MD;  [facility];   Service: General;  Laterality: N/A;  . REPLACEMENT TOTAL KNEE Bilateral 2007  . TOTAL SHOULDER REPLACEMENT Left 2007    There were no vitals filed for this visit.      Subjective Assessment - 01/27/17 1349    Subjective Nothing new. Doing better with her husband doing the velcro wrap since going over that with Val the last time. Has one week left of radiation.   Currently in Pain? No/denies                         [facility] Adult PT Treatment/Exercise - 01/27/17 0001      Manual Therapy   Edema Management assisted patient to don her compression sleeve and glove   Manual Lymphatic Drainage (MLD) In Supine: Short neck, superficial and deep abdomen, Rt inguinal nodes and Rt axillo-inguinal anastomosis, Lt axillary nodes and anterior inter-axillary anastomosis, and Rt UE from fingers to lateral shoulder working proximal from distal then retracing all steps.                PT Education - 01/27/17 1504    Education provided Yes   Education Details reminded patient that she can wear the velcro garments on days when she doesn't have the help she needs to  get her compression sleeve and glove on   Person(s) Educated Patient   Methods Explanation   Comprehension Verbalized understanding                [facility] Goals - 01/21/17 1712      CC Long Term Goal  #8   Title Patient will be knowledgeable about best use of her day- and nighttime compression garments (including using velcro garment in daytime when needed)   Time 4   Period Weeks   Status New     Additional Goals   Additional Goals Yes     Additional Goal #1   Title Right arm circumference at 10 cm. proximal to ulnar styloid will reduce to 28 cm. or less   Baseline 28.8 at time of re-eval on 01/21/17   Time 4   Period Weeks   Status New            Plan - 01/27/17 1505    Clinical Impression Statement Patient feels she and her husband are managing velcro garments better at home now;  she also has a way to get help to don her compression sleeve at the [place] after XRT if needed.  She was reminded she can use the velcro garment if she is unable to get the daytime garments on, and she seemed to understand this well.   Rehab Potential Good   Clinical Impairments Affecting Rehab Potential previous total joint replacements in other joints with generalized weakness, obesity; caution of left total shoulder replacement  16 lymph nodes removed from right axilla    PT Frequency 2x / week   PT Duration 4 weeks   PT Treatment/Interventions ADLs/Self Care Home Management;DME Instruction;Gait training;Functional mobility training;Therapeutic activities;Therapeutic exercise;Balance training;Neuromuscular re-education;Patient/family education;Manual techniques;Manual lymph drainage;Compression bandaging;Taping   PT Next Visit Plan Remeasure arm circumferences. Focus on manual lymph drainage, review with husband prn donning of compression garments.    Consulted and Agree with Plan of Care Patient      Patient will benefit from skilled therapeutic intervention in order to improve the following deficits and impairments:  Increased edema, Obesity, Impaired UE functional use, Abnormal gait, Decreased balance  Visit Diagnosis: Lymphedema, not elsewhere classified     Problem List Patient Active Problem List   Diagnosis Date Noted  . Fall 01/06/2017  . Port catheter in place 12/25/2016  . IBS (irritable bowel syndrome) 11/25/2016  . 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 OSA on CPAP 06/26/2016  . Type 2 diabetes mellitus without complication ([place]) [id number]  . Severe obesity (BMI >= 40) ([place]) 09/04/2014  . Essential hypertension 09/04/2014  . HLD (hyperlipidemia) 09/04/2014  . Gastroesophageal reflux disease without esophagitis 09/04/2014  . Asthma, mild persistent 09/04/2014  . Urge incontinence 09/04/2014  . Seasonal allergies  09/04/2014  . Generalized anxiety disorder 09/04/2014    SALISBURY,DONNA 01/27/2017, 3:08 PM  [place], [place], [address] Phone: [phone]   Fax:  [phone]  Name: Tiffany Arnold MRN: [id number] Date of Birth: 04/18/1941  Serafina Royals, PT 01/27/17 3:08 PM

## 2017-01-28 ENCOUNTER — Ambulatory Visit
Admission: RE | Admit: 2017-01-28 | Discharge: 2017-01-28 | Disposition: A | Discharge status: Home / Self Care | Payer: Medicare PPO | Source: Ambulatory Visit | Attending: Radiation Oncology | Admitting: Radiation Oncology

## 2017-01-28 DIAGNOSIS — Z86010 Personal history of colonic polyps: Secondary | ICD-10-CM | POA: Diagnosis not present

## 2017-01-28 DIAGNOSIS — Z170 Estrogen receptor positive status [ER+]: Secondary | ICD-10-CM | POA: Diagnosis not present

## 2017-01-28 DIAGNOSIS — I890 Lymphedema, not elsewhere classified: Secondary | ICD-10-CM | POA: Diagnosis not present

## 2017-01-28 DIAGNOSIS — Z510 Encounter for antineoplastic radiation therapy: Secondary | ICD-10-CM | POA: Diagnosis not present

## 2017-01-28 DIAGNOSIS — Z87891 Personal history of nicotine dependence: Secondary | ICD-10-CM | POA: Diagnosis not present

## 2017-01-28 DIAGNOSIS — C50311 Malignant neoplasm of lower-inner quadrant of right female breast: Secondary | ICD-10-CM | POA: Diagnosis not present

## 2017-01-28 DIAGNOSIS — E785 Hyperlipidemia, unspecified: Secondary | ICD-10-CM | POA: Diagnosis not present

## 2017-01-28 DIAGNOSIS — C773 Secondary and unspecified malignant neoplasm of axilla and upper limb lymph nodes: Secondary | ICD-10-CM | POA: Diagnosis not present

## 2017-01-28 DIAGNOSIS — J45909 Unspecified asthma, uncomplicated: Secondary | ICD-10-CM | POA: Diagnosis not present

## 2017-01-29 ENCOUNTER — Other Ambulatory Visit (HOSPITAL_BASED_OUTPATIENT_CLINIC_OR_DEPARTMENT_OTHER): Payer: Medicare PPO

## 2017-01-29 ENCOUNTER — Ambulatory Visit (HOSPITAL_BASED_OUTPATIENT_CLINIC_OR_DEPARTMENT_OTHER): Payer: Medicare PPO | Admitting: Hematology

## 2017-01-29 ENCOUNTER — Encounter: Payer: Self-pay | Admitting: Physical Therapy

## 2017-01-29 ENCOUNTER — Ambulatory Visit
Admission: RE | Admit: 2017-01-29 | Discharge: 2017-01-29 | Disposition: A | Discharge status: Home / Self Care | Payer: Medicare PPO | Source: Ambulatory Visit | Attending: Radiation Oncology | Admitting: Radiation Oncology

## 2017-01-29 ENCOUNTER — Encounter: Payer: Self-pay | Admitting: Hematology

## 2017-01-29 ENCOUNTER — Telehealth: Payer: Self-pay | Admitting: Hematology

## 2017-01-29 ENCOUNTER — Ambulatory Visit: Payer: Medicare PPO | Admitting: Physical Therapy

## 2017-01-29 VITALS — BP 127/77 | HR 81 | Temp 98.0°F | Resp 18 | Ht 61.0 in | Wt 214.3 lb

## 2017-01-29 DIAGNOSIS — C773 Secondary and unspecified malignant neoplasm of axilla and upper limb lymph nodes: Secondary | ICD-10-CM | POA: Diagnosis not present

## 2017-01-29 DIAGNOSIS — Z9989 Dependence on other enabling machines and devices: Secondary | ICD-10-CM

## 2017-01-29 DIAGNOSIS — Z780 Asymptomatic menopausal state: Secondary | ICD-10-CM

## 2017-01-29 DIAGNOSIS — C50311 Malignant neoplasm of lower-inner quadrant of right female breast: Secondary | ICD-10-CM | POA: Diagnosis not present

## 2017-01-29 DIAGNOSIS — I890 Lymphedema, not elsewhere classified: Secondary | ICD-10-CM

## 2017-01-29 DIAGNOSIS — R739 Hyperglycemia, unspecified: Secondary | ICD-10-CM | POA: Diagnosis not present

## 2017-01-29 DIAGNOSIS — Z170 Estrogen receptor positive status [ER+]: Secondary | ICD-10-CM

## 2017-01-29 DIAGNOSIS — E119 Type 2 diabetes mellitus without complications: Secondary | ICD-10-CM

## 2017-01-29 DIAGNOSIS — Z87891 Personal history of nicotine dependence: Secondary | ICD-10-CM | POA: Diagnosis not present

## 2017-01-29 DIAGNOSIS — I10 Essential (primary) hypertension: Secondary | ICD-10-CM

## 2017-01-29 DIAGNOSIS — Z510 Encounter for antineoplastic radiation therapy: Secondary | ICD-10-CM | POA: Diagnosis not present

## 2017-01-29 DIAGNOSIS — Z86010 Personal history of colonic polyps: Secondary | ICD-10-CM | POA: Diagnosis not present

## 2017-01-29 DIAGNOSIS — E785 Hyperlipidemia, unspecified: Secondary | ICD-10-CM | POA: Diagnosis not present

## 2017-01-29 DIAGNOSIS — G4733 Obstructive sleep apnea (adult) (pediatric): Secondary | ICD-10-CM

## 2017-01-29 DIAGNOSIS — J45909 Unspecified asthma, uncomplicated: Secondary | ICD-10-CM | POA: Diagnosis not present

## 2017-01-29 LAB — COMPREHENSIVE METABOLIC PANEL
ALBUMIN: 3.7 g/dL (ref 3.5–5.0)
ALK PHOS: 62 U/L (ref 40–150)
ALT: 14 U/L (ref 0–55)
AST: 14 U/L (ref 5–34)
Anion Gap: 10 mEq/L (ref 3–11)
BUN: 17 mg/dL (ref 7.0–26.0)
CALCIUM: 10.7 mg/dL — AB (ref 8.4–10.4)
CHLORIDE: 102 meq/L (ref 98–109)
CO2: 28 mEq/L (ref 22–29)
CREATININE: 0.8 mg/dL (ref 0.6–1.1)
EGFR: 79 mL/min/{1.73_m2} — ABNORMAL LOW (ref >90)
Glucose: 88 mg/dl (ref 70–140)
Potassium: 3.9 mEq/L (ref 3.5–5.1)
Sodium: 140 mEq/L (ref 136–145)
Total Bilirubin: 0.35 mg/dL (ref 0.20–1.20)
Total Protein: 6.6 g/dL (ref 6.4–8.3)

## 2017-01-29 LAB — CBC WITH DIFFERENTIAL/PLATELET
BASO%: 0.3 % (ref 0.0–2.0)
Basophils Absolute: 0 10*3/uL (ref 0.0–0.1)
EOS%: 3.2 % (ref 0.0–7.0)
Eosinophils Absolute: 0.2 10*3/uL (ref 0.0–0.5)
HEMATOCRIT: 38.9 % (ref 34.8–46.6)
HEMOGLOBIN: 12.3 g/dL (ref 11.6–15.9)
LYMPH#: 1.5 10*3/uL (ref 0.9–3.3)
LYMPH%: 23.8 % (ref 14.0–49.7)
MCH: 28.3 pg (ref 25.1–34.0)
MCHC: 31.6 g/dL (ref 31.5–36.0)
MCV: 89.4 fL (ref 79.5–101.0)
MONO#: 0.6 10*3/uL (ref 0.1–0.9)
MONO%: 8.9 % (ref 0.0–14.0)
NEUT#: 4 10*3/uL (ref 1.5–6.5)
NEUT%: 63.8 % (ref 38.4–76.8)
Platelets: 265 10*3/uL (ref 145–400)
RBC: 4.35 10*6/uL (ref 3.70–5.45)
RDW: 14.5 % (ref 11.2–14.5)
WBC: 6.3 10*3/uL (ref 3.9–10.3)

## 2017-01-29 MED ORDER — LETROZOLE 2.5 MG PO TABS: 2.5000 mg | ORAL_TABLET | Freq: Every day | ORAL | 2 refills | Status: DC

## 2017-01-29 NOTE — Therapy (Signed)
[place], [place], [address] Phone: [phone]   Fax:  [phone]  Physical Therapy Treatment  Patient Details  Name: Tiffany Arnold MRN: [id number] Date of Birth: [birth year] Referring Provider: Dr. Eppie Gibson  Encounter Date: 01/29/2017      PT End of Session - 01/29/17 1055    Visit Number 4   Number of Visits 9   Date for PT Re-Evaluation 02/27/17   PT Start Time 1012   PT Stop Time 1056   PT Time Calculation (min) 44 min   Activity Tolerance Patient tolerated treatment well   Behavior During Therapy [facility] for tasks assessed/performed      Past Medical History:  Diagnosis Date  . Allergy   . Arthritis   . Asthma   . Cancer ([place]) 07/29/2016   right breast  . Cataract of both eyes   . Chicken pox   . Colon polyps   . Dog bite of index finger 07/23/2016   Left index finger  . GERD (gastroesophageal reflux disease)   . Hyperlipidemia   . Hypertension   . IBS (irritable bowel syndrome)   . Phlebitis   . Pneumonia    hx of several yrs ago  . Shortness of breath dyspnea    with exertion only  . Sleep apnea    wears CPAP machine nightly    Past Surgical History:  Procedure Laterality Date  . BACK SURGERY     lower  . BREAST CYST EXCISION Right 2001   negative  . BREAST LUMPECTOMY WITH NEEDLE LOCALIZATION AND AXILLARY LYMPH NODE DISSECTION Right 07/29/2016   Procedure: RIGHT BREAST LUMPECTOMY WITH DOUBLE NEEDLE LOCALIZATION AND COMPLETE RIGHT AXILLARY LYMPH NODE DISSECTION;  Surgeon: Fanny Skates, MD;  Location: [facility];  Service: General;  Laterality: Right;  . BREAST SURGERY Left 2000   Biopsy  . BUNIONECTOMY Bilateral 1998   great toe fusion on right foot  . COLONOSCOPY W/ POLYPECTOMY    . HERNIA REPAIR  [id number]   [place] SINUS SURGERY  2015  . PORTACATH PLACEMENT N/A 09/05/2016   Procedure: INSERTION PORT-A-CATH;  Surgeon: Fanny Skates, MD;  [facility];   Service: General;  Laterality: N/A;  . REPLACEMENT TOTAL KNEE Bilateral 2007  . TOTAL SHOULDER REPLACEMENT Left 2007    There were no vitals filed for this visit.      Subjective Assessment - 01/29/17 1015    Subjective Doing pretty well.  Going to radiation straight from here.   Pertinent History Right breast lumpectomy with 16 lymph nodes removed on July 29, 2016. Had chemotherapy.  Started XRT 12/24/16 and should finish it 02/03/17.  Has had bilateral knee replacements within the past 5 years. Had left shoulder replaced. Patient has been seen here about three months ago.   Patient Stated Goals get the arm to reduce   Currently in Pain? No/denies               LYMPHEDEMA/ONCOLOGY QUESTIONNAIRE - 01/29/17 1018      Right Upper Extremity Lymphedema   15 cm Proximal to Olecranon Process 40.7 cm   10 cm Proximal to Olecranon Process 40.5 cm   Olecranon Process 29.5 cm   15 cm Proximal to Ulnar Styloid Process 30 cm   10 cm Proximal to Ulnar Styloid Process 28.4 cm   Just Proximal to Ulnar Styloid Process 18.5 cm   Across Hand at [employer] 18.4 cm   At [employer]  of 2nd Digit 6.4 cm                  [facility] Adult PT Treatment/Exercise - 01/29/17 0001      Manual Therapy   Manual Lymphatic Drainage (MLD) In Supine: Short neck, superficial and deep abdomen, Rt inguinal nodes and Rt axillo-inguinal anastomosis, Lt axillary nodes and anterior inter-axillary anastomosis, and Rt UE from fingers to lateral shoulder working proximal from distal then retracing all steps.                        [facility] Goals - 01/21/17 1712      CC Long Term Goal  #8   Title Patient will be knowledgeable about best use of her day- and nighttime compression garments (including using velcro garment in daytime when needed)   Time 4   Period Weeks   Status New     Additional Goals   Additional Goals Yes     Additional Goal #1   Title Right arm circumference at 10  cm. proximal to ulnar styloid will reduce to 28 cm. or less   Baseline 28.8 at time of re-eval on 01/21/17   Time 4   Period Weeks   Status New            Plan - 01/29/17 1055    Clinical Impression Statement Patient doing well managing her garments with her husband's help. Measurements today appeared slightly smaller than last week so she continues to make progress.  She will benefit from continued PT to furhter reduce and manage swelling.   Rehab Potential Excellent   Clinical Impairments Affecting Rehab Potential previous total joint replacements in other joints with generalized weakness, obesity; caution of left total shoulder replacement  16 lymph nodes removed from right axilla    PT Frequency 2x / week   PT Duration 4 weeks   PT Treatment/Interventions ADLs/Self Care Home Management;DME Instruction;Gait training;Functional mobility training;Therapeutic activities;Therapeutic exercise;Balance training;Neuromuscular re-education;Patient/family education;Manual techniques;Manual lymph drainage;Compression bandaging;Taping   PT Next Visit Plan Manual lymph drainage   Consulted and Agree with Plan of Care Patient      Patient will benefit from skilled therapeutic intervention in order to improve the following deficits and impairments:  Increased edema, Obesity, Impaired UE functional use, Abnormal gait, Decreased balance  Visit Diagnosis: Lymphedema, not elsewhere classified     Problem List Patient Active Problem List   Diagnosis Date Noted  . Fall 01/06/2017  . Port catheter in place 12/25/2016  . IBS (irritable bowel syndrome) 11/25/2016  . Breast cancer of lower-inner quadrant of right female breast ([place]) 06/27/2016  . OSA on CPAP 06/26/2016  . Type 2 diabetes mellitus without complication ([place]) 123XX123  . Severe obesity (BMI >= 40) ([place]) 09/04/2014  . Essential hypertension 09/04/2014  . HLD (hyperlipidemia) 09/04/2014  . Gastroesophageal reflux disease without  esophagitis 09/04/2014  . Asthma, mild persistent 09/04/2014  . Urge incontinence 09/04/2014  . Seasonal allergies 09/04/2014  . Generalized anxiety disorder 09/04/2014    Annia Friendly, PT 01/29/17 11:00 AM  [place], [place], [address] Phone: [phone]   Fax:  [phone]  Name: Tiffany Arnold MRN: [id number] Date of Birth: 12-13-1941

## 2017-01-29 NOTE — Telephone Encounter (Signed)
Pt confirmed appt and received avs °

## 2017-01-30 ENCOUNTER — Ambulatory Visit
Admission: RE | Admit: 2017-01-30 | Discharge: 2017-01-30 | Disposition: A | Discharge status: Home / Self Care | Payer: Medicare PPO | Source: Ambulatory Visit | Attending: Radiation Oncology | Admitting: Radiation Oncology

## 2017-01-30 ENCOUNTER — Ambulatory Visit: Payer: Medicare PPO

## 2017-01-30 DIAGNOSIS — E785 Hyperlipidemia, unspecified: Secondary | ICD-10-CM | POA: Diagnosis not present

## 2017-01-30 DIAGNOSIS — C50311 Malignant neoplasm of lower-inner quadrant of right female breast: Secondary | ICD-10-CM | POA: Diagnosis not present

## 2017-01-30 DIAGNOSIS — Z86010 Personal history of colonic polyps: Secondary | ICD-10-CM | POA: Diagnosis not present

## 2017-01-30 DIAGNOSIS — Z170 Estrogen receptor positive status [ER+]: Secondary | ICD-10-CM | POA: Diagnosis not present

## 2017-01-30 DIAGNOSIS — J45909 Unspecified asthma, uncomplicated: Secondary | ICD-10-CM | POA: Diagnosis not present

## 2017-01-30 DIAGNOSIS — Z87891 Personal history of nicotine dependence: Secondary | ICD-10-CM | POA: Diagnosis not present

## 2017-01-30 DIAGNOSIS — Z510 Encounter for antineoplastic radiation therapy: Secondary | ICD-10-CM | POA: Diagnosis not present

## 2017-01-30 DIAGNOSIS — C773 Secondary and unspecified malignant neoplasm of axilla and upper limb lymph nodes: Secondary | ICD-10-CM | POA: Diagnosis not present

## 2017-01-30 DIAGNOSIS — I890 Lymphedema, not elsewhere classified: Secondary | ICD-10-CM | POA: Diagnosis not present

## 2017-02-02 ENCOUNTER — Ambulatory Visit
Admission: RE | Admit: 2017-02-02 | Discharge: 2017-02-02 | Disposition: A | Discharge status: Home / Self Care | Payer: Medicare PPO | Source: Ambulatory Visit | Attending: Radiation Oncology | Admitting: Radiation Oncology

## 2017-02-02 ENCOUNTER — Ambulatory Visit: Payer: Medicare PPO

## 2017-02-02 ENCOUNTER — Encounter: Payer: Self-pay | Admitting: Radiation Oncology

## 2017-02-02 ENCOUNTER — Telehealth: Payer: Self-pay

## 2017-02-02 VITALS — BP 137/60 | HR 72 | Temp 98.1°F | Resp 12 | Wt 213.6 lb

## 2017-02-02 DIAGNOSIS — Z170 Estrogen receptor positive status [ER+]: Principal | ICD-10-CM

## 2017-02-02 DIAGNOSIS — C773 Secondary and unspecified malignant neoplasm of axilla and upper limb lymph nodes: Secondary | ICD-10-CM | POA: Diagnosis not present

## 2017-02-02 DIAGNOSIS — Z86010 Personal history of colonic polyps: Secondary | ICD-10-CM | POA: Diagnosis not present

## 2017-02-02 DIAGNOSIS — E785 Hyperlipidemia, unspecified: Secondary | ICD-10-CM | POA: Diagnosis not present

## 2017-02-02 DIAGNOSIS — C50311 Malignant neoplasm of lower-inner quadrant of right female breast: Secondary | ICD-10-CM | POA: Diagnosis not present

## 2017-02-02 DIAGNOSIS — I890 Lymphedema, not elsewhere classified: Secondary | ICD-10-CM | POA: Diagnosis not present

## 2017-02-02 DIAGNOSIS — Z510 Encounter for antineoplastic radiation therapy: Secondary | ICD-10-CM | POA: Diagnosis not present

## 2017-02-02 DIAGNOSIS — J45909 Unspecified asthma, uncomplicated: Secondary | ICD-10-CM | POA: Diagnosis not present

## 2017-02-02 DIAGNOSIS — Z87891 Personal history of nicotine dependence: Secondary | ICD-10-CM | POA: Diagnosis not present

## 2017-02-02 NOTE — Telephone Encounter (Signed)
Metformin left v/m; pt is new to [facility] and pt is requesting new rx for glucophage; per 12/03/16 office note; may consider changing metformin to glipizide.Please advise. Also on 12/03/16 lab result note metformin was D/C.Please advise.

## 2017-02-02 NOTE — Progress Notes (Signed)
   Weekly Management Note:  Outpatient    ICD-9-CM ICD-10-CM   1. Malignant neoplasm of lower-inner quadrant of right breast of female, estrogen receptor positive (HCC) 174.3 C50.311    V86.0 Z17.0     Current Dose: 48.6 Gy  Projected Dose: 50.4 Gy   Narrative:  The patient presents for routine under treatment assessment.  CBCT/MVCT images/Port film x-rays were reviewed.  The chart was checked. Doing well, following with PT, more tender.  Physical Findings:  weight is 213 lb 9.6 oz (96.9 kg). Her oral temperature is 98.1 F (36.7 C). Her blood pressure is 137/60 and her pulse is 72. Her respiration is 12 and oxygen saturation is 98%.   Wt Readings from Last 3 Encounters:  02/02/17 213 lb 9.6 oz (96.9 kg)  01/29/17 214 lb 4.8 oz (97.2 kg)  01/26/17 212 lb 6.4 oz (96.3 kg)  Bright erythema of right breast, skin superficially peeling at IM fold over several cm span  Impression:  The patient is tolerating radiotherapy.  Plan:  Continue radiotherapy as planned. Patient instructed to apply Radiplex to intact skin in treatment fields and neosporin instead at IM fold. Continue PT for progressive lymphedema of RUE since discharge and difficulty applying sleeve. F/u card given.   ________________________________   Eppie Gibson, M.D.

## 2017-02-02 NOTE — Progress Notes (Signed)
PAIN: She is currently in no pain SKIN: Pt right breast- positive for occasional Pruritus, erythema, breast tenderness and moist desquamation.  Pt denies edema.  Pt continues to apply Radiaplex and Neosporin as directed. OTHER: Completes radiation tomorrow.  Follow up card given.   BP 137/60   Pulse 72   Temp 98.1 F (36.7 C) (Oral)   Resp 12   Wt 213 lb 9.6 oz (96.9 kg)   SpO2 98%   BMI 40.36 kg/m  Wt Readings from Last 3 Encounters:  02/02/17 213 lb 9.6 oz (96.9 kg)  01/29/17 214 lb 4.8 oz (97.2 kg)  01/26/17 212 lb 6.4 oz (96.3 kg)

## 2017-02-03 ENCOUNTER — Encounter: Payer: Medicare PPO | Admitting: Physical Therapy

## 2017-02-03 ENCOUNTER — Ambulatory Visit
Admission: RE | Admit: 2017-02-03 | Discharge: 2017-02-03 | Disposition: A | Discharge status: Home / Self Care | Payer: Medicare PPO | Source: Ambulatory Visit | Attending: Radiation Oncology | Admitting: Radiation Oncology

## 2017-02-03 ENCOUNTER — Ambulatory Visit: Payer: Medicare PPO

## 2017-02-03 ENCOUNTER — Ambulatory Visit (HOSPITAL_BASED_OUTPATIENT_CLINIC_OR_DEPARTMENT_OTHER): Payer: Medicare PPO

## 2017-02-03 DIAGNOSIS — J45909 Unspecified asthma, uncomplicated: Secondary | ICD-10-CM | POA: Diagnosis not present

## 2017-02-03 DIAGNOSIS — E785 Hyperlipidemia, unspecified: Secondary | ICD-10-CM | POA: Diagnosis not present

## 2017-02-03 DIAGNOSIS — C50311 Malignant neoplasm of lower-inner quadrant of right female breast: Secondary | ICD-10-CM | POA: Diagnosis not present

## 2017-02-03 DIAGNOSIS — Z86010 Personal history of colonic polyps: Secondary | ICD-10-CM | POA: Diagnosis not present

## 2017-02-03 DIAGNOSIS — Z170 Estrogen receptor positive status [ER+]: Secondary | ICD-10-CM | POA: Diagnosis not present

## 2017-02-03 DIAGNOSIS — Z452 Encounter for adjustment and management of vascular access device: Secondary | ICD-10-CM

## 2017-02-03 DIAGNOSIS — Z87891 Personal history of nicotine dependence: Secondary | ICD-10-CM | POA: Diagnosis not present

## 2017-02-03 DIAGNOSIS — Z510 Encounter for antineoplastic radiation therapy: Secondary | ICD-10-CM | POA: Diagnosis not present

## 2017-02-03 DIAGNOSIS — C773 Secondary and unspecified malignant neoplasm of axilla and upper limb lymph nodes: Secondary | ICD-10-CM | POA: Diagnosis not present

## 2017-02-03 DIAGNOSIS — Z95828 Presence of other vascular implants and grafts: Secondary | ICD-10-CM

## 2017-02-03 DIAGNOSIS — I890 Lymphedema, not elsewhere classified: Secondary | ICD-10-CM | POA: Diagnosis not present

## 2017-02-03 MED ORDER — SODIUM CHLORIDE 0.9% FLUSH
10.0000 mL | INTRAVENOUS | Status: DC | PRN
  Administered 2017-02-03: 10 mL via INTRAVENOUS
  Filled 2017-02-03: qty 10

## 2017-02-03 MED ORDER — HEPARIN SOD (PORK) LOCK FLUSH 100 UNIT/ML IV SOLN
500.0000 [IU] | Freq: Once | INTRAVENOUS | Status: AC | PRN
  Administered 2017-02-03: 500 [IU] via INTRAVENOUS
  Filled 2017-02-03: qty 5

## 2017-02-03 NOTE — Telephone Encounter (Signed)
Metformin was d/c'd and Glipizide was sent to her pharmacy. So Metformin should not be refilled. Does she need a refill of Glipizide to [facility]?

## 2017-02-03 NOTE — Telephone Encounter (Signed)
Called [facility] but no one would answer when I requested to speak to someone and went to VM---left info on VM that pt is not supposed to be taking and only taking the glipizide  Left detailed msg on VM per HIPAA

## 2017-02-03 NOTE — Addendum Note (Signed)
Addended by: Lurlean Nanny on: 02/03/2017 10:32 AM   Modules accepted: Orders

## 2017-02-04 ENCOUNTER — Ambulatory Visit: Payer: Medicare PPO | Admitting: Physical Therapy

## 2017-02-04 ENCOUNTER — Ambulatory Visit: Payer: Medicare PPO

## 2017-02-04 DIAGNOSIS — I890 Lymphedema, not elsewhere classified: Secondary | ICD-10-CM

## 2017-02-04 NOTE — Telephone Encounter (Signed)
Patient received Melanie's voice mail.  Patient is getting the Glipizide refilled today.  Patient called the pharmacy this morning.

## 2017-02-04 NOTE — Therapy (Signed)
[place], [place], [address] Phone: [phone]   Fax:  [phone]  Physical Therapy Treatment  Patient Details  Name: Tiffany Arnold MRN: [id number] Date of Birth: 04-25-1941 Referring Provider: Dr. Eppie Gibson  Encounter Date: 02/04/2017      PT End of Session - 02/04/17 1213    Visit Number 5   Number of Visits 9   Date for PT Re-Evaluation 02/27/17   PT Start Time 1112   PT Stop Time 1150   PT Time Calculation (min) 38 min   Activity Tolerance Patient tolerated treatment well   Behavior During Therapy [facility] for tasks assessed/performed      Past Medical History:  Diagnosis Date  . Allergy   . Arthritis   . Asthma   . Cancer ([place]) 07/29/2016   right breast  . Cataract of both eyes   . Chicken pox   . Colon polyps   . Dog bite of index finger 07/23/2016   Left index finger  . GERD (gastroesophageal reflux disease)   . Hyperlipidemia   . Hypertension   . IBS (irritable bowel syndrome)   . Phlebitis   . Pneumonia    hx of several yrs ago  . Shortness of breath dyspnea    with exertion only  . Sleep apnea    wears CPAP machine nightly    Past Surgical History:  Procedure Laterality Date  . BACK SURGERY     lower  . BREAST CYST EXCISION Right 2001   negative  . BREAST LUMPECTOMY WITH NEEDLE LOCALIZATION AND AXILLARY LYMPH NODE DISSECTION Right 07/29/2016   Procedure: RIGHT BREAST LUMPECTOMY WITH DOUBLE NEEDLE LOCALIZATION AND COMPLETE RIGHT AXILLARY LYMPH NODE DISSECTION;  Surgeon: Fanny Skates, MD;  Location: [facility];  Service: General;  Laterality: Right;  . BREAST SURGERY Left 2000   Biopsy  . BUNIONECTOMY Bilateral 1998   great toe fusion on right foot  . COLONOSCOPY W/ POLYPECTOMY    . HERNIA REPAIR  [id number]   [place] SINUS SURGERY  2015  . PORTACATH PLACEMENT N/A 09/05/2016   Procedure: INSERTION PORT-A-CATH;  Surgeon: Fanny Skates, MD;  [facility];   Service: General;  Laterality: N/A;  . REPLACEMENT TOTAL KNEE Bilateral 2007  . TOTAL SHOULDER REPLACEMENT Left 2007    There were no vitals filed for this visit.      Subjective Assessment - 02/04/17 1114    Subjective Had my last radiation yesterday and they flushed my Port.  Got a snag in the sleeve this morning.   Currently in Pain? No/denies                         [facility] Adult PT Treatment/Exercise - 02/04/17 0001      Manual Therapy   Manual Lymphatic Drainage (MLD) In Supine: Short neck, superficial and deep abdomen, Rt inguinal nodes and Rt axillo-inguinal anastomosis, Lt axillary nodes and anterior inter-axillary anastomosis, and Rt UE from dorsal hand to lateral shoulder working proximal from distal then retracing all steps; then in left sidelying, posterior interaxillary anastomosis and right axillo-inguinal anastomosis   Compression Bandaging applied patient's compression sleeve and glove at end of sessions                        [facility] - 01/21/17 1712      CC Long Term Goal  #8   Title  Patient will be knowledgeable about best use of her day- and nighttime compression garments (including using velcro garment in daytime when needed)   Time 4   Period Weeks   Status New     Additional Goals   Additional Goals Yes     Additional Goal #1   Title Right arm circumference at 10 cm. proximal to ulnar styloid will reduce to 28 cm. or less   Baseline 28.8 at time of re-eval on 01/21/17   Time 4   Period Weeks   Status New            Plan - 02/04/17 1214    Clinical Impression Statement Patient came in with sleeve and glove on today, and her husband was the one who had helped her don those.  That is progress, because she reported he was having trouble doing this before.     Rehab Potential Excellent   Clinical Impairments Affecting Rehab Potential previous total joint replacements in other joints with generalized  weakness, obesity; caution of left total shoulder replacement  16 lymph nodes removed from right axilla    PT Frequency 2x / week   PT Duration 4 weeks   PT Treatment/Interventions ADLs/Self Care Home Management;DME Instruction;Gait training;Functional mobility training;Therapeutic activities;Therapeutic exercise;Balance training;Neuromuscular re-education;Patient/family education;Manual techniques;Manual lymph drainage;Compression bandaging;Taping   PT Next Visit Plan Remeasure; Manual lymph drainage   Consulted and Agree with Plan of Care Patient      Patient will benefit from skilled therapeutic intervention in order to improve the following deficits and impairments:  Increased edema, Obesity, Impaired UE functional use, Abnormal gait, Decreased balance  Visit Diagnosis: Lymphedema, not elsewhere classified     Problem List Patient Active Problem List   Diagnosis Date Noted  . Fall 01/06/2017  . Port catheter in place 12/25/2016  . IBS (irritable bowel syndrome) 11/25/2016  . Breast cancer of lower-inner quadrant of right female breast ([place]) 06/27/2016  . OSA on CPAP 06/26/2016  . Type 2 diabetes mellitus without complication ([place]) 123XX123  . Severe obesity (BMI >= 40) ([place]) 09/04/2014  . Essential hypertension 09/04/2014  . HLD (hyperlipidemia) 09/04/2014  . Gastroesophageal reflux disease without esophagitis 09/04/2014  . Asthma, mild persistent 09/04/2014  . Urge incontinence 09/04/2014  . Seasonal allergies 09/04/2014  . Generalized anxiety disorder 09/04/2014    Tiffany Arnold 02/04/2017, 12:15 PM  [place], [place], [address] Phone: [phone]   Fax:  [phone]  Name: Tiffany Arnold MRN: [id number] Date of Birth: October 19, 1941  Serafina Royals, PT 02/04/17 12:15 PM

## 2017-02-05 ENCOUNTER — Encounter: Payer: Self-pay | Admitting: Physical Therapy

## 2017-02-05 ENCOUNTER — Ambulatory Visit: Payer: Medicare PPO

## 2017-02-05 ENCOUNTER — Ambulatory Visit: Payer: Medicare PPO | Admitting: Physical Therapy

## 2017-02-05 DIAGNOSIS — I890 Lymphedema, not elsewhere classified: Secondary | ICD-10-CM

## 2017-02-05 NOTE — Therapy (Signed)
[place], [place], [address] Phone: [phone]   Fax:  [phone]  Physical Therapy Treatment  Patient Details  Name: Tiffany Arnold MRN: [id number] Date of Birth: November 27, 1941 Referring Provider: Dr. Eppie Gibson  Encounter Date: 02/05/2017      PT End of Session - 02/05/17 1108    Visit Number 6   Number of Visits 9   Date for PT Re-Evaluation 02/27/17   PT Start Time 1105   PT Stop Time 1152   PT Time Calculation (min) 47 min   Activity Tolerance Patient tolerated treatment well   Behavior During Therapy [facility] for tasks assessed/performed      Past Medical History:  Diagnosis Date  . Allergy   . Arthritis   . Asthma   . Cancer ([place]) 07/29/2016   right breast  . Cataract of both eyes   . Chicken pox   . Colon polyps   . Dog bite of index finger 07/23/2016   Left index finger  . GERD (gastroesophageal reflux disease)   . Hyperlipidemia   . Hypertension   . IBS (irritable bowel syndrome)   . Phlebitis   . Pneumonia    hx of several yrs ago  . Shortness of breath dyspnea    with exertion only  . Sleep apnea    wears CPAP machine nightly    Past Surgical History:  Procedure Laterality Date  . BACK SURGERY     lower  . BREAST CYST EXCISION Right 2001   negative  . BREAST LUMPECTOMY WITH NEEDLE LOCALIZATION AND AXILLARY LYMPH NODE DISSECTION Right 07/29/2016   Procedure: RIGHT BREAST LUMPECTOMY WITH DOUBLE NEEDLE LOCALIZATION AND COMPLETE RIGHT AXILLARY LYMPH NODE DISSECTION;  Surgeon: Fanny Skates, MD;  Location: [place];  Service: General;  Laterality: Right;  . BREAST SURGERY Left 2000   Biopsy  . BUNIONECTOMY Bilateral 1998   great toe fusion on right foot  . COLONOSCOPY W/ POLYPECTOMY    . HERNIA REPAIR  [id number]   [place] SINUS SURGERY  2015  . PORTACATH PLACEMENT N/A 09/05/2016   Procedure: INSERTION PORT-A-CATH;  Surgeon: Fanny Skates, MD;  Location[facility];   Service: General;  Laterality: N/A;  . REPLACEMENT TOTAL KNEE Bilateral 2007  . TOTAL SHOULDER REPLACEMENT Left 2007    There were no vitals filed for this visit.      Subjective Assessment - 02/05/17 1107    Subjective Done with radiation. I'm wearing my sleeve because I really want to get it under control.   Pertinent History Right breast lumpectomy with 16 lymph nodes removed on July 29, 2016. Had chemotherapy.  Started XRT 12/24/16 and should finish it 02/03/17.  Has had bilateral knee replacements within the past 5 years. Had left shoulder replaced. Patient has been seen here about three months ago.   Patient Stated Goals get the arm to reduce   Currently in Pain? No/denies               LYMPHEDEMA/ONCOLOGY QUESTIONNAIRE - 02/05/17 1109      Right Upper Extremity Lymphedema   15 cm Proximal to Olecranon Process 41.5 cm   10 cm Proximal to Olecranon Process 41.3 cm   Olecranon Process 30.8 cm   15 cm Proximal to Ulnar Styloid Process 30.5 cm   10 cm Proximal to Ulnar Styloid Process 28.8 cm   Just Proximal to Ulnar Styloid Process 18.2 cm   Across Hand at [employer]  18.8 cm   At [facility] of 2nd Digit 6.5 cm                  [facility] Adult PT Treatment/Exercise - 02/05/17 0001      Manual Therapy   Manual Lymphatic Drainage (MLD) In Supine: Short neck, superficial and deep abdomen, Rt inguinal nodes and Rt axillo-inguinal anastomosis, Lt axillary nodes and anterior inter-axillary anastomosis, and Rt UE from dorsal hand to lateral shoulder working proximal from distal then retracing all steps; then in left sidelying, posterior interaxillary anastomosis and right axillo-inguinal anastomosis   Compression Bandaging Assisted pt with donning compression sleeve and glove after manual lymph drainage was completed.                        [facility] Goals - 01/21/17 1712      CC Long Term Goal  #8   Title Patient will be knowledgeable about best  use of her day- and nighttime compression garments (including using velcro garment in daytime when needed)   Time 4   Period Weeks   Status New     Additional Goals   Additional Goals Yes     Additional Goal #1   Title Right arm circumference at 10 cm. proximal to ulnar styloid will reduce to 28 cm. or less   Baseline 28.8 at time of re-eval on 01/21/17   Time 4   Period Weeks   Status New            Plan - 02/05/17 1108    Clinical Impression Statement She continues to have persistent swelling but is slowly getting better controlled. She had increased skin mobility noted today compare with last week. She will benefit from continued PT to reduce her arm.   Rehab Potential Excellent   Clinical Impairments Affecting Rehab Potential previous total joint replacements in other joints with generalized weakness, obesity; caution of left total shoulder replacement  16 lymph nodes removed from right axilla    PT Frequency 2x / week   PT Duration 4 weeks   PT Treatment/Interventions ADLs/Self Care Home Management;DME Instruction;Gait training;Functional mobility training;Therapeutic activities;Therapeutic exercise;Balance training;Neuromuscular re-education;Patient/family education;Manual techniques;Manual lymph drainage;Compression bandaging;Taping   PT Next Visit Plan Manual lymph drainage   Consulted and Agree with Plan of Care Patient      Patient will benefit from skilled therapeutic intervention in order to improve the following deficits and impairments:  Increased edema, Obesity, Impaired UE functional use, Abnormal gait, Decreased balance  Visit Diagnosis: Lymphedema, not elsewhere classified     Problem List Patient Active Problem List   Diagnosis Date Noted  . Fall 01/06/2017  . Port catheter in place 12/25/2016  . IBS (irritable bowel syndrome) 11/25/2016  . Breast cancer of lower-inner quadrant of right female breast ([place]) 06/27/2016  . OSA on CPAP 06/26/2016  . Type  2 diabetes mellitus without complication ([place]) 123XX123  . Severe obesity (BMI >= 40) ([place]) 09/04/2014  . Essential hypertension 09/04/2014  . HLD (hyperlipidemia) 09/04/2014  . Gastroesophageal reflux disease without esophagitis 09/04/2014  . Asthma, mild persistent 09/04/2014  . Urge incontinence 09/04/2014  . Seasonal allergies 09/04/2014  . Generalized anxiety disorder 09/04/2014   Annia Friendly, PT 02/05/17 11:52 AM  [place], [place], [address] Phone: [phone]   Fax:  [phone]  Name: Tiffany Arnold MRN: [id number] Date of Birth: August 27, 1941

## 2017-02-06 ENCOUNTER — Telehealth: Payer: Self-pay | Admitting: *Deleted

## 2017-02-06 ENCOUNTER — Ambulatory Visit: Payer: Medicare PPO

## 2017-02-06 NOTE — Telephone Encounter (Signed)
  Oncology Nurse Navigator Documentation  Navigator Location: [facility] (02/06/17 1400)   )Navigator Encounter Type: Telephone (02/06/17 1400) Telephone: [facility] Call (02/06/17 1400)                   Patient Visit Type: [id number] (02/06/17 1400) Treatment Phase: Final Radiation Tx (02/06/17 1400)     Interventions: Referrals (02/06/17 1400) Referrals: Survivorship (02/06/17 1400)                    Time Spent with Patient: 15 (02/06/17 1400)

## 2017-02-07 DIAGNOSIS — G4733 Obstructive sleep apnea (adult) (pediatric): Secondary | ICD-10-CM | POA: Diagnosis not present

## 2017-02-09 DIAGNOSIS — G4733 Obstructive sleep apnea (adult) (pediatric): Secondary | ICD-10-CM | POA: Diagnosis not present

## 2017-02-10 ENCOUNTER — Ambulatory Visit: Payer: Medicare PPO

## 2017-02-10 DIAGNOSIS — I890 Lymphedema, not elsewhere classified: Secondary | ICD-10-CM

## 2017-02-10 NOTE — Therapy (Signed)
[place], [place], [address] Phone: [phone]   Fax:  [phone]  Physical Therapy Treatment  Patient Details  Name: Tiffany Arnold MRN: [id number] Date of Birth: 03/24/1941 Referring Provider: Dr. Eppie Gibson  Encounter Date: 02/10/2017      PT End of Session - 02/10/17 1203    Visit Number 7   Number of Visits 9   Date for PT Re-Evaluation 02/27/17   PT Start Time 1106   PT Stop Time 1156   PT Time Calculation (min) 50 min   Activity Tolerance Patient tolerated treatment well   Behavior During Therapy [facility] for tasks assessed/performed      Past Medical History:  Diagnosis Date  . Allergy   . Arthritis   . Asthma   . Cancer ([place]) 07/29/2016   right breast  . Cataract of both eyes   . Chicken pox   . Colon polyps   . Dog bite of index finger 07/23/2016   Left index finger  . GERD (gastroesophageal reflux disease)   . Hyperlipidemia   . Hypertension   . IBS (irritable bowel syndrome)   . Phlebitis   . Pneumonia    hx of several yrs ago  . Shortness of breath dyspnea    with exertion only  . Sleep apnea    wears CPAP machine nightly    Past Surgical History:  Procedure Laterality Date  . BACK SURGERY     lower  . BREAST CYST EXCISION Right 2001   negative  . BREAST LUMPECTOMY WITH NEEDLE LOCALIZATION AND AXILLARY LYMPH NODE DISSECTION Right 07/29/2016   Procedure: RIGHT BREAST LUMPECTOMY WITH DOUBLE NEEDLE LOCALIZATION AND COMPLETE RIGHT AXILLARY LYMPH NODE DISSECTION;  Surgeon: Fanny Skates, MD;  Location: [place];  Service: General;  Laterality: Right;  . BREAST SURGERY Left 2000   Biopsy  . BUNIONECTOMY Bilateral 1998   great toe fusion on right foot  . COLONOSCOPY W/ POLYPECTOMY    . HERNIA REPAIR  [id number]   [place] SINUS SURGERY  2015  . PORTACATH PLACEMENT N/A 09/05/2016   Procedure: INSERTION PORT-A-CATH;  Surgeon: Fanny Skates, MD;  [facility];   Service: General;  Laterality: N/A;  . REPLACEMENT TOTAL KNEE Bilateral 2007  . TOTAL SHOULDER REPLACEMENT Left 2007    There were no vitals filed for this visit.      Subjective Assessment - 02/10/17 1107    Subjective I've been wearing my sleeve every day and it's comfortable once I get it on. We have to use my gloves and donning butler but I have a CNA and she helps too.    Pertinent History Right breast lumpectomy with 16 lymph nodes removed on July 29, 2016. Had chemotherapy.  Started XRT 12/24/16 and should finish it 02/03/17.  Has had bilateral knee replacements within the past 5 years. Had left shoulder replaced. Patient has been seen here about three months ago.   Patient Stated Goals get the arm to reduce   Currently in Pain? No/denies               LYMPHEDEMA/ONCOLOGY QUESTIONNAIRE - 02/10/17 1143      Right Upper Extremity Lymphedema   15 cm Proximal to Olecranon Process 37.5 cm   10 cm Proximal to Olecranon Process 39.4 cm   Olecranon Process 29.1 cm   15 cm Proximal to Ulnar Styloid Process 28.6 cm   10 cm Proximal to Ulnar Styloid Process 27.3 cm  Just Proximal to Ulnar Styloid Process 16.1 cm   Across Hand at [employer] 17.2 cm   At [place] of 2nd Digit 5.8 cm                  [facility] Adult PT Treatment/Exercise - 02/10/17 0001      Manual Therapy   Manual Lymphatic Drainage (MLD) In Supine: Short neck, superficial and deep abdomen, Rt inguinal nodes and Rt axillo-inguinal anastomosis, Lt axillary nodes and anterior inter-axillary anastomosis, and Rt UE from dorsal hand to lateral shoulder working proximal from distal then retracing all steps; then in left sidelying, posterior interaxillary anastomosis and right axillo-inguinal anastomosis   Compression Bandaging Assisted pt with donning compression sleeve and glove after manual lymph drainage was completed.                        [facility] Goals - 01/21/17 1712      CC  Long Term Goal  #8   Title Patient will be knowledgeable about best use of her day- and nighttime compression garments (including using velcro garment in daytime when needed)   Time 4   Period Weeks   Status New     Additional Goals   Additional Goals Yes     Additional Goal #1   Title Right arm circumference at 10 cm. proximal to ulnar styloid will reduce to 28 cm. or less   Baseline 28.8 at time of re-eval on 01/21/17   Time 4   Period Weeks   Status New            Plan - 02/10/17 1204    Clinical Impression Statement PTs circumference measurements have reduced very well since measured last week and her UE overall felt much softer than it has in the past. Pt has also been wearing her compression sleeve all day every day now with glove, and can verbalize importance of being consistently compliant with this. She has only one more scheduled visit and plans on making that her last appt. She is interested in pursuing a Reidsleeve to at least see what the out of pocket cost will be.   Rehab Potential Excellent   Clinical Impairments Affecting Rehab Potential previous total joint replacements in other joints with generalized weakness, obesity; caution of left total shoulder replacement  16 lymph nodes removed from right axilla    PT Frequency 2x / week   PT Duration 4 weeks   PT Treatment/Interventions ADLs/Self Care Home Management;DME Instruction;Gait training;Functional mobility training;Therapeutic activities;Therapeutic exercise;Balance training;Neuromuscular re-education;Patient/family education;Manual techniques;Manual lymph drainage;Compression bandaging;Taping   PT Next Visit Plan D/C next visit; Manual lymph drainage   Recommended Other Services Instruct pt to call [place] regarding price of Reidsleeve garment   Consulted and Agree with Plan of Care Patient      Patient will benefit from skilled therapeutic intervention in order to improve the following deficits and  impairments:  Increased edema, Obesity, Impaired UE functional use, Abnormal gait, Decreased balance  Visit Diagnosis: Lymphedema, not elsewhere classified     Problem List Patient Active Problem List   Diagnosis Date Noted  . Fall 01/06/2017  . Port catheter in place 12/25/2016  . IBS (irritable bowel syndrome) 11/25/2016  . Breast cancer of lower-inner quadrant of right female breast ([place]) 06/27/2016  . OSA on CPAP 06/26/2016  . Type 2 diabetes mellitus without complication ([place]) 123XX123  . Severe obesity (BMI >= 40) ([place]) 09/04/2014  . Essential  hypertension 09/04/2014  . HLD (hyperlipidemia) 09/04/2014  . Gastroesophageal reflux disease without esophagitis 09/04/2014  . Asthma, mild persistent 09/04/2014  . Urge incontinence 09/04/2014  . Seasonal allergies 09/04/2014  . Generalized anxiety disorder 09/04/2014    Otelia Limes, PTA 02/10/2017, 12:11 PM  [place], [place], [address] Phone: [phone]   Fax:  [phone]  Name: Tiffany Arnold MRN: [id number] Date of Birth: 04-Sep-1941

## 2017-02-12 ENCOUNTER — Encounter: Payer: Self-pay | Admitting: Physical Therapy

## 2017-02-12 ENCOUNTER — Ambulatory Visit: Payer: Medicare PPO | Admitting: Physical Therapy

## 2017-02-12 DIAGNOSIS — I890 Lymphedema, not elsewhere classified: Secondary | ICD-10-CM

## 2017-02-12 NOTE — Therapy (Signed)
[place], [place], [address] Phone: [phone]   Fax:  [phone]  Physical Therapy Treatment  Patient Details  Name: Tiffany Arnold MRN: [id number] Date of Birth: 1941/06/30 Referring Provider: Dr. Eppie Gibson  Encounter Date: 02/12/2017      PT End of Session - 02/12/17 1146    Visit Number 8   Number of Visits 9   Date for PT Re-Evaluation 02/27/17   PT Start Time 1105   PT Stop Time 1146   PT Time Calculation (min) 41 min   Activity Tolerance Patient tolerated treatment well   Behavior During Therapy [facility] for tasks assessed/performed      Past Medical History:  Diagnosis Date  . Allergy   . Arthritis   . Asthma   . Cancer ([place]) 07/29/2016   right breast  . Cataract of both eyes   . Chicken pox   . Colon polyps   . Dog bite of index finger 07/23/2016   Left index finger  . GERD (gastroesophageal reflux disease)   . Hyperlipidemia   . Hypertension   . IBS (irritable bowel syndrome)   . Phlebitis   . Pneumonia    hx of several yrs ago  . Shortness of breath dyspnea    with exertion only  . Sleep apnea    wears CPAP machine nightly    Past Surgical History:  Procedure Laterality Date  . BACK SURGERY     lower  . BREAST CYST EXCISION Right 2001   negative  . BREAST LUMPECTOMY WITH NEEDLE LOCALIZATION AND AXILLARY LYMPH NODE DISSECTION Right 07/29/2016   Procedure: RIGHT BREAST LUMPECTOMY WITH DOUBLE NEEDLE LOCALIZATION AND COMPLETE RIGHT AXILLARY LYMPH NODE DISSECTION;  Surgeon: Fanny Skates, MD;  Location: [facility];  Service: General;  Laterality: Right;  . BREAST SURGERY Left 2000   Biopsy  . BUNIONECTOMY Bilateral 1998   great toe fusion on right foot  . COLONOSCOPY W/ POLYPECTOMY    . HERNIA REPAIR  [id number]   [place] SINUS SURGERY  2015  . PORTACATH PLACEMENT N/A 09/05/2016   Procedure: INSERTION PORT-A-CATH;  Surgeon: Fanny Skates, MD;  [facility];   Service: General;  Laterality: N/A;  . REPLACEMENT TOTAL KNEE Bilateral 2007  . TOTAL SHOULDER REPLACEMENT Left 2007    There were no vitals filed for this visit.      Subjective Assessment - 02/12/17 1118    Subjective This is my last day of therapy. I'm doing really well!   Pertinent History Right breast lumpectomy with 16 lymph nodes removed on July 29, 2016. Had chemotherapy.  Started XRT 12/24/16 and should finish it 02/03/17.  Has had bilateral knee replacements within the past 5 years. Had left shoulder replaced. Patient has been seen here about three months ago.   Patient Stated Goals get the arm to reduce   Currently in Pain? No/denies               LYMPHEDEMA/ONCOLOGY QUESTIONNAIRE - 02/12/17 1114      Right Upper Extremity Lymphedema   15 cm Proximal to Olecranon Process 40.8 cm   10 cm Proximal to Olecranon Process 40.6 cm   Olecranon Process 30.1 cm   15 cm Proximal to Ulnar Styloid Process 30.1 cm   10 cm Proximal to Ulnar Styloid Process 28.8 cm   Just Proximal to Ulnar Styloid Process 18.3 cm   Across Hand at [employer] 18 cm   At  Base of 2nd Digit 6 cm           Tiffany Arnold - 02/12/17 0001    Open a tight or new jar Mild difficulty   Do heavy household chores (wash walls, wash floors) Moderate difficulty   Carry a shopping bag or briefcase No difficulty   Wash your back Severe difficulty   Use a knife to cut food No difficulty   Recreational activities in which you take some force or impact through your arm, shoulder, or hand (golf, hammering, tennis) Moderate difficulty   During the past week, to what extent has your arm, shoulder or hand problem interfered with your normal social activities with family, friends, neighbors, or groups? Not at all   During the past week, to what extent has your arm, shoulder or hand problem limited your work or other regular daily activities Modererately   Arm, shoulder, or hand pain. Severe   Tingling (pins and  needles) in your arm, shoulder, or hand Mild   Difficulty Sleeping Mild difficulty   DASH Score 34.09 %               [facility] Adult PT Treatment/Exercise - 02/12/17 0001      Manual Therapy   Manual Lymphatic Drainage (MLD) In Supine: Short neck, superficial and deep abdomen, Rt inguinal nodes and Rt axillo-inguinal anastomosis, Lt axillary nodes and anterior inter-axillary anastomosis, and Rt UE from dorsal hand to lateral shoulder working proximal from distal then retracing all steps; then in left sidelying, posterior interaxillary anastomosis and right axillo-inguinal anastomosis   Compression Bandaging Assisted pt with donning compression sleeve and glove after manual lymph drainage was completed.                        [facility] Goals - 02/12/17 1146      CC Long Term Goal  #1   Title Patient with verbalize an understanding of lymphedema risk reduction precautions   Time 4   Period Weeks   Status Achieved     CC Long Term Goal  #2   Title Patient will be independent in a home exercise program for range of motion and strength so that she can carry through at home    Time 4   Period Weeks   Status Achieved     CC Long Term Goal  #3   Title Patient will report a decrease in pain by 50% so they can perform daily activities with greater ease   Time 4   Period Weeks   Status Achieved     CC Long Term Goal  #4   Title Patient will decrease the DASH score to <60   to demonstrate increased functional use of upper extremity   Time 4   Period Weeks   Status Achieved     CC Long Term Goal  #5   Title Patient will improve right  shoulder abduction to 155 degrees so that she can achieve position needed for radiation therapy.   Baseline 114 degrees 09/04/16; 143 degrees on 09/12/16  155 on 09/16/2016   Time 4   Period Weeks   Status Achieved     CC Long Term Goal  #7   Title Pt will obtain appropriate compression garments such that she can manage her  lymphedema independently   Time 1   Period Weeks   Status Achieved     CC Long Term Goal  #8   Title Patient  will be knowledgeable about best use of her day- and nighttime compression garments (including using velcro garment in daytime when needed)   Time 4   Period Weeks   Status Achieved     Additional Goal #1   Title Right arm circumference at 10 cm. proximal to ulnar styloid will reduce to 28 cm. or less   Time 4   Period Weeks   Status Achieved          Patient will benefit from skilled therapeutic intervention in order to improve the following deficits and impairments:     Visit Diagnosis: Lymphedema, not elsewhere classified       G-Codes - 2017/02/22 1149    Functional Assessment Tool Used (Outpatient Only) Quick Dash   Functional Limitation Self care   Self Care Goal Status (G2563) At least 20 percent but less than 40 percent impaired, limited or restricted   Self Care Discharge Status [phone]) At least 20 percent but less than 40 percent impaired, limited or restricted      Problem List Patient Active Problem List   Diagnosis Date Noted  . Fall 01/06/2017  . Port catheter in place 12/25/2016  . IBS (irritable bowel syndrome) 11/25/2016  . Breast cancer of lower-inner quadrant of right female breast ([place]) 06/27/2016  . OSA on CPAP 06/26/2016  . Type 2 diabetes mellitus without complication ([place]) 42/87/6811  . Severe obesity (BMI >= 40) ([place]) 09/04/2014  . Essential hypertension 09/04/2014  . HLD (hyperlipidemia) 09/04/2014  . Gastroesophageal reflux disease without esophagitis 09/04/2014  . Asthma, mild persistent 09/04/2014  . Urge incontinence 09/04/2014  . Seasonal allergies 09/04/2014  . Generalized anxiety disorder 09/04/2014   Annia Friendly, PT 2017/02/22 12:09 PM  [place], [place], [address] Phone: [phone]   Fax:  [phone]  Name: Tiffany Arnold MRN: [id number] Date of Birth: [birth year]  PHYSICAL THERAPY DISCHARGE SUMMARY  Visits from Start of Care: 8  Current functional level related to goals / functional outcomes: All goals met. Pt is doing very well managing her lymphedema.  See above for objective measurements.   Remaining deficits: Some persistent lymphedema but it is well managed.   Education / Equipment: Daytime and night time compression garments.  Plan: Patient agrees to discharge.  Patient goals were met. Patient is being discharged due to meeting the stated rehab goals.  ?????  Annia Friendly, [place] 02-22-2017 12:10 PM

## 2017-02-13 ENCOUNTER — Encounter: Payer: Self-pay | Admitting: Radiation Oncology

## 2017-02-13 NOTE — Progress Notes (Signed)
  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           Right Axilla treated to 45 Gy in 25 fractions.  Beams/energy:   Right Breast : 3D  //  10X, 15X        Right Axilla : 3D  //  15X  Narrative: The patient tolerated radiation treatment relatively well. She experienced some radiation related skin changes, including bright erythema and superficial peeling in the treatment areas. The patient also experienced progressive lymphedema of the right upper extremity.  Plan: The patient has completed radiation treatment. The patient was advised to continue following with PT to address her progressive lymphedema. She was advised to continue applying Radiaplex to intact skin in the treatment field, and Neosporin at the inframammary fold. The patient will return to radiation oncology clinic for routine followup in one month. I advised them to call or return sooner if they have any questions or concerns related to their recovery or treatment.  -----------------------------------  Eppie Gibson, MD  This document serves as a record of services personally performed by Eppie Gibson, MD. It was created on her behalf by Maryla Morrow, a trained medical scribe. The creation of this record is based on the scribe's personal observations and the provider's statements to them. This document has been checked and approved by the attending provider.

## 2017-02-25 ENCOUNTER — Other Ambulatory Visit: Payer: Self-pay | Admitting: Internal Medicine

## 2017-02-25 DIAGNOSIS — F411 Generalized anxiety disorder: Secondary | ICD-10-CM

## 2017-03-04 ENCOUNTER — Encounter: Payer: Self-pay | Admitting: Radiation Oncology

## 2017-03-05 ENCOUNTER — Other Ambulatory Visit (INDEPENDENT_AMBULATORY_CARE_PROVIDER_SITE_OTHER): Payer: Medicare PPO

## 2017-03-05 ENCOUNTER — Other Ambulatory Visit: Payer: Self-pay | Admitting: Internal Medicine

## 2017-03-05 ENCOUNTER — Telehealth: Payer: Self-pay | Admitting: Radiology

## 2017-03-05 DIAGNOSIS — E119 Type 2 diabetes mellitus without complications: Secondary | ICD-10-CM | POA: Diagnosis not present

## 2017-03-05 DIAGNOSIS — E7800 Pure hypercholesterolemia, unspecified: Secondary | ICD-10-CM

## 2017-03-05 LAB — COMPREHENSIVE METABOLIC PANEL
ALBUMIN: 3.8 g/dL (ref 3.5–5.2)
ALK PHOS: 52 U/L (ref 39–117)
ALT: 15 U/L (ref 0–35)
AST: 21 U/L (ref 0–37)
BUN: 25 mg/dL — AB (ref 6–23)
CALCIUM: 10.4 mg/dL (ref 8.4–10.5)
CHLORIDE: 102 meq/L (ref 96–112)
CO2: 31 mEq/L (ref 19–32)
Creatinine, Ser: 0.78 mg/dL (ref 0.40–1.20)
GFR: 76.89 mL/min (ref >60.00)
Glucose, Bld: 47 mg/dL — CL (ref 70–99)
POTASSIUM: 3.1 meq/L — AB (ref 3.5–5.1)
SODIUM: 137 meq/L (ref 135–145)
Total Bilirubin: 0.4 mg/dL (ref 0.2–1.2)
Total Protein: 6.6 g/dL (ref 6.0–8.3)

## 2017-03-05 LAB — LIPID PANEL
CHOLESTEROL: 155 mg/dL (ref 0–200)
HDL: 45.5 mg/dL (ref >39.00)
LDL CALC: 91 mg/dL (ref 0–99)
NonHDL: 109.91
TRIGLYCERIDES: 94 mg/dL (ref 0.0–149.0)
Total CHOL/HDL Ratio: 3
VLDL: 18.8 mg/dL (ref 0.0–40.0)

## 2017-03-05 LAB — HEMOGLOBIN A1C: Hgb A1c MFr Bld: 5.5 % (ref 4.6–6.5)

## 2017-03-05 NOTE — Progress Notes (Signed)
Opened in

## 2017-03-05 NOTE — Addendum Note (Signed)
Addended by: Ellamae Sia on: 03/05/2017 11:25 AM   Modules accepted: Orders

## 2017-03-05 NOTE — Telephone Encounter (Signed)
Please refer to results note.  

## 2017-03-05 NOTE — Telephone Encounter (Signed)
Call pt:  Her glucose was low when she came to get her labs. Was she fasting. Has she had anything to eat today. How is she feeling?

## 2017-03-05 NOTE — Telephone Encounter (Signed)
[facility] lab called a critical Glucose-47, Results given to [name], NP

## 2017-03-06 ENCOUNTER — Ambulatory Visit
Admission: RE | Admit: 2017-03-06 | Discharge: 2017-03-06 | Disposition: A | Discharge status: Home / Self Care | Payer: Medicare PPO | Source: Ambulatory Visit | Attending: Radiation Oncology | Admitting: Radiation Oncology

## 2017-03-06 ENCOUNTER — Encounter: Payer: Self-pay | Admitting: Radiation Oncology

## 2017-03-06 DIAGNOSIS — Z79899 Other long term (current) drug therapy: Secondary | ICD-10-CM | POA: Diagnosis not present

## 2017-03-06 DIAGNOSIS — Z170 Estrogen receptor positive status [ER+]: Secondary | ICD-10-CM | POA: Diagnosis not present

## 2017-03-06 DIAGNOSIS — Z923 Personal history of irradiation: Secondary | ICD-10-CM | POA: Diagnosis not present

## 2017-03-06 DIAGNOSIS — C50311 Malignant neoplasm of lower-inner quadrant of right female breast: Secondary | ICD-10-CM | POA: Diagnosis not present

## 2017-03-06 MED ORDER — POTASSIUM CHLORIDE ER 10 MEQ PO TBCR: 10.0000 meq | EXTENDED_RELEASE_TABLET | Freq: Two times a day (BID) | ORAL | 0 refills | Status: DC

## 2017-03-06 MED ORDER — GLIPIZIDE 5 MG PO TABS: 5.0000 mg | ORAL_TABLET | Freq: Two times a day (BID) | ORAL | 0 refills | Status: DC

## 2017-03-06 NOTE — Addendum Note (Signed)
Addended by: Lurlean Nanny on: 03/06/2017 04:05 PM   Modules accepted: Orders

## 2017-03-06 NOTE — Progress Notes (Signed)
Radiation Oncology         [phone] ________________________________  Name: Tiffany Arnold MRN: [id number]  Date: 03/06/2017  DOB: 1941/12/15  Follow-Up Visit Note  Outpatient  CC: Tiffany Silversmith, NP  [name], NP  Diagnosis and Prior Radiotherapy:    ICD-9-CM ICD-10-CM   1. 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 Right Axilla treated to 45 Gy in 25 fractions.  CHIEF COMPLAINT: Here for follow-up and surveillance of Right Breast cancer  Narrative:  The patient returns today for routine follow-up of radiation completed to the Right breast and axilla on 02/03/17.   On review of systems, the patient denies pain or fatigue, other than some weakness. She reports some numbness in places over her right breast, with occasional sharp pains. She is using Vitamin E cream to the skin over the right breast. The patient is wearing a compression sleeve on the right arm.   Patient has returned to work. She began taking Letrozole on 02/19/17 and denies any problems with the medicine so far. Her next appointment with Dr. Burr Medico in 04/01/17. Additionally, the patient reports she is scheduled to have her PAC removed in April. She has an appointment with Survivorship in June.                               ALLERGIES:  is allergic to other.  Meds: Current Outpatient Prescriptions  Medication Sig Dispense Refill  . albuterol-ipratropium (COMBIVENT) 18-103 MCG/ACT inhaler Inhale 2 puffs into the lungs every 4 (four) hours as needed for wheezing or shortness of breath.     . ALPRAZolam (XANAX) 0.25 MG tablet TAKE ONE TABLET BY MOUTH TWICE DAILY AS NEEDED 60 tablet 0  . cetirizine (ZYRTEC) 10 MG tablet Take 10 mg by mouth daily.      Marland Kitchen glipiZIDE (GLUCOTROL) 10 MG tablet Take 1 tablet (10 mg total) by mouth 2 (two) times daily before a meal. 180 tablet 1  . glucosamine-chondroitin 500-400 MG tablet Take 1 tablet by mouth 2 (two) times daily.    Marland Kitchen letrozole (FEMARA) 2.5 MG tablet Take 1 tablet (2.5 mg total) by mouth daily. 30 tablet 2  . lidocaine-prilocaine (EMLA) cream Apply to port at least 1 hour prior to chemo 30 g 1  . Olmesartan-Amlodipine-HCTZ 40-5-25 MG TABS Take 1 tablet by mouth daily. 90 tablet 1  . oxybutynin (DITROPAN-XL) 10 MG 24 hr tablet Take 1 tablet (10 mg total) by mouth at bedtime. 90 tablet 1  . pravastatin (PRAVACHOL) 40 MG tablet Take 1 tablet (40 mg total) by mouth daily. 90 tablet 1  . ranitidine (ZANTAC) 150 MG tablet Take 150 mg by mouth every evening.     . sertraline (ZOLOFT) 25 MG tablet TAKE ONE (1) TABLET EACH DAY 30 tablet 2  . vitamin B-12 (CYANOCOBALAMIN) 1000 MCG tablet Take 1,000 mcg by mouth daily.    . Calcium Carbonate-Vitamin D (CALCIUM-VITAMIN D) 500-200 MG-UNIT tablet Take 1 tablet by mouth daily.    . Cholecalciferol (VITAMIN D3) 1000 units CAPS Take by mouth 2 (two) times daily.    . diphenoxylate-atropine (LOMOTIL) 2.5-0.025 MG tablet Take 2 tablets by  mouth 4 (four) times daily as needed for diarrhea or loose stools. (Patient not taking: Reported on 01/26/2017) 30 tablet 0  . EPINEPHrine 0.3 mg/0.3 mL IJ SOAJ injection Inject 0.3 mg into the muscle once as needed (ALLERGIC REACTION).     . magic mouthwash SOLN Swish and Spit 5-10 mLs 4 times a day as needed. (Patient not taking: Reported on 01/05/2017) 240 mL 1  . Omega-3 Fatty Acids (FISH OIL) 1000 MG CAPS Take 1,000 mg by mouth daily.    . ondansetron (ZOFRAN) 8 MG tablet TAKE ONE TABLET BY MOUTH TWICE DAILY AS NEEDED FOR  REFRACTORY  NAUSEA/VOMITING.  START  ON  DAY  3  AFTER  CHEMO. (Patient not taking: Reported on 01/05/2017) 30 tablet 1  . prochlorperazine (COMPAZINE) 10 MG tablet TAKE ONE TABLET BY MOUTH EVERY 6 HOURS AS NEEDED  FOR NAUSEA AND VOMITING (Patient not taking: Reported on 01/05/2017) 30 tablet 1   No current facility-administered medications for this encounter.     Physical Findings: The patient is in no acute distress. Patient is alert and oriented.  height is 5' 1"  (1.549 m) and weight is 204 lb (92.5 kg). Her temperature is 97.7 F (36.5 C). Her blood pressure is 121/60 and her pulse is 63. Her oxygen saturation is 99%.    Breast: The right breast is swollen and erythematous without significant warmth. Skin is intact without peeling.   Lab Findings: Lab Results  Component Value Date   WBC 6.3 01/29/2017   HGB 12.3 01/29/2017   HCT 38.9 01/29/2017   MCV 89.4 01/29/2017   PLT 265 01/29/2017    Radiographic Findings: No results found.  Impression/Plan:  The patient is recovering from the effects of radiation therapy.  Breast has residual erythema and swelling but she feels it is getting progressively better week by week.  I do not think it is cellulitis, but rather residual effects of radiotherapy.  I advised the patient to contact me if she begins to feel poorly, or develops a fever over 100.4, and I may prescribe an antibiotic as needed. Patient will continue to use Vitamin E oil cream to the skin in the treatment area.  I will see the patient back on an as needed basis. She will continue to follow with Dr. Burr Medico as indicated.    _____________________________________   Eppie Gibson, MD  This document serves as a record of services personally performed by Eppie Gibson, MD. It was created on her behalf by Maryla Morrow, a trained medical scribe. The creation of this record is based on the scribe's personal observations and the provider's statements to them. This document has been checked and approved by the attending provider.

## 2017-03-06 NOTE — Progress Notes (Signed)
Ms. Longfield presents for follow up of radiation completed 02/03/17 to her Right Breast. She denies pain or fatigue. She has some numbness in places to her Right Breast and occasional sharp pains. Her Right Breast is red with edema present. She is using Vitamin E cream over her Right Breast. She began taking Letrozole on March 1st and denies difficulty with this medicine. Her next appointment with Dr. Burr Medico is April 11th, 2018. She is also scheduled to have her porta cath removed in April. She has an appointment with survivorship 06/12/17.  BP 121/60   Pulse 63   Temp 97.7 F (36.5 C)   Ht 5\' 1"  (1.549 m)   Wt 204 lb (92.5 kg)   SpO2 99% Comment: room air  BMI 38.55 kg/m    Wt Readings from Last 3 Encounters:  03/06/17 204 lb (92.5 kg)  02/02/17 213 lb 9.6 oz (96.9 kg)  01/29/17 214 lb 4.8 oz (97.2 kg)

## 2017-03-07 DIAGNOSIS — G4733 Obstructive sleep apnea (adult) (pediatric): Secondary | ICD-10-CM | POA: Diagnosis not present

## 2017-03-11 ENCOUNTER — Ambulatory Visit (INDEPENDENT_AMBULATORY_CARE_PROVIDER_SITE_OTHER): Payer: Medicare PPO | Admitting: Internal Medicine

## 2017-03-11 ENCOUNTER — Encounter: Payer: Self-pay | Admitting: Internal Medicine

## 2017-03-11 VITALS — BP 124/78 | HR 57 | Temp 97.8°F | Wt 202.0 lb

## 2017-03-11 DIAGNOSIS — K580 Irritable bowel syndrome with diarrhea: Secondary | ICD-10-CM

## 2017-03-11 DIAGNOSIS — F419 Anxiety disorder, unspecified: Secondary | ICD-10-CM

## 2017-03-11 DIAGNOSIS — E7800 Pure hypercholesterolemia, unspecified: Secondary | ICD-10-CM

## 2017-03-11 DIAGNOSIS — E119 Type 2 diabetes mellitus without complications: Secondary | ICD-10-CM

## 2017-03-11 DIAGNOSIS — T502X5A Adverse effect of carbonic-anhydrase inhibitors, benzothiadiazides and other diuretics, initial encounter: Secondary | ICD-10-CM | POA: Diagnosis not present

## 2017-03-11 DIAGNOSIS — E876 Hypokalemia: Secondary | ICD-10-CM

## 2017-03-11 NOTE — Progress Notes (Signed)
Subjective:    Patient ID: Tiffany Arnold, female    DOB: [birth year], 76 y.o.   MRN: [id number]  HPI  Pt presents to the clinic today for 3 month follow up.  DM 2: She was having diarrhea with Metformin so it was switched to Glipizide. Her A1C has gone from 7.2 to 5.5%. I advised her to cut back on her Glipizide to 5 mg BID. She does not check her sugars. She does not need a microalbumin secondary to ARB therapy. Her last eye exam was 03/2016. She checks her feet daily.  IBS: Better since stopping Metformin and starting Zoloft for stress and anxiety.  Anxiety: Recently deteriorated secondary to dealing with breast cancer and caring for her husband who has dementia. She was started on Zoloft at her last visit. She has noticed a difference in her anxiety. She uses the Xanax daily.  HLD: Her LDL is at goal on Pravastatin but triglycerides remain elevated. I advised her to start Zetia but she has declines at this time. She would like to consume a low fat diet for now.  Her potassium was also low on her recent labs. She was started on Potassium Chloride. She has been taking this as prescribed.  Review of Systems      Past Medical History:  Diagnosis Date  . Allergy   . Arthritis   . Asthma   . Cancer ([place]) 07/29/2016   right breast  . Cataract of both eyes   . Chicken pox   . Colon polyps   . Dog bite of index finger 07/23/2016   Left index finger  . GERD (gastroesophageal reflux disease)   . History of radiation therapy 12/24/16- 02/03/17   Right Breast 50.4 Gy in 28 fractions, Right Axilla 45 Gy in 25 fractions.  . Hyperlipidemia   . Hypertension   . IBS (irritable bowel syndrome)   . Phlebitis   . Pneumonia    hx of several yrs ago  . Shortness of breath dyspnea    with exertion only  . Sleep apnea    wears CPAP machine nightly    Current Outpatient Prescriptions  Medication Sig Dispense Refill  . albuterol-ipratropium (COMBIVENT) 18-103 MCG/ACT inhaler Inhale 2  puffs into the lungs every 4 (four) hours as needed for wheezing or shortness of breath.     . ALPRAZolam (XANAX) 0.25 MG tablet TAKE ONE TABLET BY MOUTH TWICE DAILY AS NEEDED 60 tablet 0  . Calcium Carbonate-Vitamin D (CALCIUM-VITAMIN D) 500-200 MG-UNIT tablet Take 1 tablet by mouth daily.    . cetirizine (ZYRTEC) 10 MG tablet Take 10 mg by mouth daily.    . Cholecalciferol (VITAMIN D3) 1000 units CAPS Take by mouth 2 (two) times daily.    . diphenoxylate-atropine (LOMOTIL) 2.5-0.025 MG tablet Take 2 tablets by mouth 4 (four) times daily as needed for diarrhea or loose stools. (Patient not taking: Reported on 01/26/2017) 30 tablet 0  . EPINEPHrine 0.3 mg/0.3 mL IJ SOAJ injection Inject 0.3 mg into the muscle once as needed (ALLERGIC REACTION).     Marland Kitchen glipiZIDE (GLUCOTROL) 5 MG tablet Take 1 tablet (5 mg total) by mouth 2 (two) times daily before a meal. 90 tablet 0  . glucosamine-chondroitin 500-400 MG tablet Take 1 tablet by mouth 2 (two) times daily.    Marland Kitchen letrozole (FEMARA) 2.5 MG tablet Take 1 tablet (2.5 mg total) by mouth daily. 30 tablet 2  . lidocaine-prilocaine (EMLA) cream Apply to port at least 1 hour prior  to chemo 30 g 1  . magic mouthwash SOLN Swish and Spit 5-10 mLs 4 times a day as needed. (Patient not taking: Reported on 01/05/2017) 240 mL 1  . Olmesartan-Amlodipine-HCTZ 40-5-25 MG TABS Take 1 tablet by mouth daily. 90 tablet 1  . Omega-3 Fatty Acids (FISH OIL) 1000 MG CAPS Take 1,000 mg by mouth daily.    . ondansetron (ZOFRAN) 8 MG tablet TAKE ONE TABLET BY MOUTH TWICE DAILY AS NEEDED FOR  REFRACTORY  NAUSEA/VOMITING.  START  ON  DAY  3  AFTER  CHEMO. (Patient not taking: Reported on 01/05/2017) 30 tablet 1  . oxybutynin (DITROPAN-XL) 10 MG 24 hr tablet Take 1 tablet (10 mg total) by mouth at bedtime. 90 tablet 1  . potassium chloride (K-DUR) 10 MEQ tablet Take 1 tablet (10 mEq total) by mouth 2 (two) times daily. 180 tablet 0  . pravastatin (PRAVACHOL) 40 MG tablet Take 1 tablet (40  mg total) by mouth daily. 90 tablet 1  . prochlorperazine (COMPAZINE) 10 MG tablet TAKE ONE TABLET BY MOUTH EVERY 6 HOURS AS NEEDED FOR NAUSEA AND VOMITING (Patient not taking: Reported on 01/05/2017) 30 tablet 1  . ranitidine (ZANTAC) 150 MG tablet Take 150 mg by mouth every evening.     . sertraline (ZOLOFT) 25 MG tablet TAKE ONE (1) TABLET EACH DAY 30 tablet 2  . vitamin B-12 (CYANOCOBALAMIN) 1000 MCG tablet Take 1,000 mcg by mouth daily.     No current facility-administered medications for this visit.     Allergies  Allergen Reactions  . Other Swelling and Shortness Of Breath    Cats, dogs, mold Induced asthma Cats, dogs, mold    Family History  Problem Relation Age of Onset  . Arthritis Mother   . Stroke Mother   . Hypertension Mother   . Cancer Father     Prostate  . Stroke Father   . Hypertension Father   . Hypertension Maternal Grandmother   . Rheum arthritis Maternal Grandfather   . Stroke Maternal Grandfather   . Hypertension Maternal Grandfather   . Cancer Paternal Grandmother     Colon  . Hypertension Paternal Grandmother   . Hypertension Paternal Grandfather   . Breast cancer Neg Hx     Social History   Social History  . Marital status: Married    Spouse name: N/A  . Number of children: N/A  . Years of education: N/A   Occupational History  . Not on file.   Social History Main Topics  . Smoking status: Former Smoker    Packs/day: 2.00    Years: 25.00    Quit date: 12/22/1990  . Smokeless tobacco: Never Used     Comment: quit 24 years ago  . Alcohol use Yes     Comment: rare  . Drug use: No  . Sexual activity: Not Currently   Other Topics Concern  . Not on file   Social History Narrative  . No narrative on file     Constitutional: Pt reports weight loss. Denies fever, malaise, fatigue, headache.  Respiratory: Denies difficulty breathing, shortness of breath, cough or sputum production.   Cardiovascular: Denies chest pain, chest tightness,  palpitations or swelling in the hands or feet.  Gastrointestinal: Denies abdominal pain, bloating, constipation, diarrhea or blood in the stool.  Skin: Denies redness, rashes, lesions or ulcercations.  Neurological: Denies dizziness, difficulty with memory, difficulty with speech or problems with balance and coordination.  Psych: Pt reports anxiety. Denies depression, SI/HI.  No other specific complaints in a complete review of systems (except as listed in HPI above).  Objective:   Physical Exam  BP 124/78   Pulse (!) 57   Temp 97.8 F (36.6 C) (Oral)   Wt 202 lb (91.6 kg)   SpO2 97%   BMI 38.17 kg/m  Wt Readings from Last 3 Encounters:  03/11/17 202 lb (91.6 kg)  03/06/17 204 lb (92.5 kg)  02/02/17 213 lb 9.6 oz (96.9 kg)    General: Appears her stated age, obese in NAD. Skin: Warm, dry and intact. No ulcerations noted. Cardiovascular: Normal rate and rhythm.  Pulmonary/Chest: Normal effort and positive vesicular breath sounds. No respiratory distress. No wheezes, rales or ronchi noted.  Abdomen: Soft and nontender. Normal bowel sounds.  Musculoskeletal: Using rollater for assistance with gait.  Neurological: Alert and oriented.  Psychiatric: Mood and affect normal. Behavior is normal. Judgment and thought content normal.    BMET    Component Value Date/Time   NA 137 03/05/2017 1058   NA 140 01/29/2017 1257   K 3.1 (L) 03/05/2017 1058   K 3.9 01/29/2017 1257   CL 102 03/05/2017 1058   CO2 31 03/05/2017 1058   CO2 28 01/29/2017 1257   GLUCOSE 47 (LL) 03/05/2017 1058   GLUCOSE 88 01/29/2017 1257   BUN 25 (H) 03/05/2017 1058   BUN 17.0 01/29/2017 1257   CREATININE 0.78 03/05/2017 1058   CREATININE 0.8 01/29/2017 1257   CALCIUM 10.4 03/05/2017 1058   CALCIUM 10.7 (H) 01/29/2017 1257   GFRNONAA >60 07/29/2016 2029   GFRAA >60 07/29/2016 2029    Lipid Panel     Component Value Date/Time   CHOL 155 03/05/2017 1058   TRIG 94.0 03/05/2017 1058   HDL 45.50  03/05/2017 1058   CHOLHDL 3 03/05/2017 1058   VLDL 18.8 03/05/2017 1058   LDLCALC 91 03/05/2017 1058    CBC    Component Value Date/Time   WBC 6.3 01/29/2017 1257   WBC 11.9 (H) 07/29/2016 2029   RBC 4.35 01/29/2017 1257   RBC 4.16 07/29/2016 2029   HGB 12.3 01/29/2017 1257   HCT 38.9 01/29/2017 1257   PLT 265 01/29/2017 1257   MCV 89.4 01/29/2017 1257   MCH 28.3 01/29/2017 1257   MCH 28.6 07/29/2016 2029   MCHC 31.6 01/29/2017 1257   MCHC 31.5 07/29/2016 2029   RDW 14.5 01/29/2017 1257   LYMPHSABS 1.5 01/29/2017 1257   MONOABS 0.6 01/29/2017 1257   EOSABS 0.2 01/29/2017 1257   BASOSABS 0.0 01/29/2017 1257    Hgb A1C Lab Results  Component Value Date   HGBA1C 5.5 03/05/2017            Assessment & Plan:   DM 2:  She has cut back on Glipizide 5 mg BID Will check A1C in 3 months Continue yearly eye exams  Foot exams UTD  IBS:  Better off Metformin Will monior  Anxiety:  Better on Zoloft Try not to take the Xanax daily Support offered today  Hypokalemia:  Diuretic induced Continue Potassium Chloride Recheck potassium in 2 weeks  HLD:  Continue Pravastatin She declines Zetia  RTC in 9 months for Medicare Wellness Exam [name], NP

## 2017-03-11 NOTE — Patient Instructions (Signed)
Hypokalemia Hypokalemia means that the amount of potassium in the blood is lower than normal.Potassium is a chemical that helps regulate the amount of fluid in the body (electrolyte). It also stimulates muscle tightening (contraction) and helps nerves work properly.Normally, most of the body's potassium is inside of cells, and only a very small amount is in the blood. Because the amount in the blood is so small, minor changes to potassium levels in the blood can be life-threatening. What are the causes? This condition may be caused by:  Antibiotic medicine.  Diarrhea or vomiting. Taking too much of a medicine that helps you have a bowel movement (laxative) can cause diarrhea and lead to hypokalemia.  Chronic kidney disease (CKD).  Medicines that help the body get rid of excess fluid (diuretics).  Eating disorders, such as bulimia.  Low magnesium levels in the body.  Sweating a lot. What are the signs or symptoms? Symptoms of this condition include:  Weakness.  Constipation.  Fatigue.  Muscle cramps.  Mental confusion.  Skipped heartbeats or irregular heartbeat (palpitations).  Tingling or numbness. How is this diagnosed? This condition is diagnosed with a blood test. How is this treated? Hypokalemia can be treated by taking potassium supplements by mouth or adjusting the medicines that you take. Treatment may also include eating more foods that contain a lot of potassium. If your potassium level is very low, you may need to get potassium through an IV tube in one of your veins and be monitored in the hospital. Follow these instructions at home:  Take over-the-counter and prescription medicines only as told by your health care provider. This includes vitamins and supplements.  Eat a healthy diet. A healthy diet includes fresh fruits and vegetables, whole grains, healthy fats, and lean proteins.  If instructed, eat more foods that contain a lot of potassium, such  as:  Nuts, such as peanuts and pistachios.  Seeds, such as sunflower seeds and pumpkin seeds.  Peas, lentils, and lima beans.  Whole grain and bran cereals and breads.  Fresh fruits and vegetables, such as apricots, avocado, bananas, cantaloupe, kiwi, oranges, tomatoes, asparagus, and potatoes.  Orange juice.  Tomato juice.  Red meats.  Yogurt.  Keep all follow-up visits as told by your health care provider. This is important. Contact a health care provider if:  You have weakness that gets worse.  You feel your heart pounding or racing.  You vomit.  You have diarrhea.  You have diabetes (diabetes mellitus) and you have trouble keeping your blood sugar (glucose) in your target range. Get help right away if:  You have chest pain.  You have shortness of breath.  You have vomiting or diarrhea that lasts for more than 2 days.  You faint. This information is not intended to replace advice given to you by your health care provider. Make sure you discuss any questions you have with your health care provider. Document Released: 12/08/2005 Document Revised: 07/26/2016 Document Reviewed: 07/26/2016 Elsevier Interactive Patient Education  2017 [employer]  

## 2017-03-12 ENCOUNTER — Ambulatory Visit: Payer: Medicare PPO | Admitting: Internal Medicine

## 2017-03-23 ENCOUNTER — Ambulatory Visit
Admission: RE | Admit: 2017-03-23 | Discharge: 2017-03-23 | Disposition: A | Discharge status: Home / Self Care | Payer: Medicare PPO | Source: Ambulatory Visit | Attending: Hematology | Admitting: Hematology

## 2017-03-23 DIAGNOSIS — M85851 Other specified disorders of bone density and structure, right thigh: Secondary | ICD-10-CM | POA: Insufficient documentation

## 2017-03-23 DIAGNOSIS — Z803 Family history of malignant neoplasm of breast: Secondary | ICD-10-CM | POA: Diagnosis not present

## 2017-03-23 DIAGNOSIS — Z780 Asymptomatic menopausal state: Secondary | ICD-10-CM | POA: Diagnosis not present

## 2017-03-23 DIAGNOSIS — Z13820 Encounter for screening for osteoporosis: Secondary | ICD-10-CM | POA: Insufficient documentation

## 2017-03-31 NOTE — Progress Notes (Signed)
[place]  [phone] [phone]  Clinic Follow Up Note   Patient Care Team: [name], NP as PCP - General (Internal Medicine) 04/01/2017   CHIEF COMPLAINTS:  Follow Up right breast cancer  . Oncology History   Breast cancer of lower-inner quadrant of right female breast [facility])   Staging form: Breast, AJCC 7th Edition   - Clinical stage from 06/09/2016: Stage IIB (T2, N1, M0) - Signed by Truitt Merle, MD on 06/27/2016   - Pathologic stage from 07/29/2016: Stage IIA (T1c, N1a, cM0) - Signed by Truitt Merle, MD on 08/13/2016       Breast cancer of lower-inner quadrant of right female breast ([place])   05/23/2016 Mammogram    Diagnostic mammogram and ultrasound showed suspicious architectural distortion within the right breast lower inner quadrant, measuring 1.3 cm, without sonographic corelate.      06/03/2016 Initial Biopsy    Right breast inferior lower quadrant core needle biopsy showed atypical ductal hyperplasia with calcifications      06/09/2016 Receptors her2    Breast biopsy showed ER 100% positive, PR 100% positive, HER-2 negative, Ki-67 40%      06/09/2016 Initial Diagnosis    Breast cancer of lower-inner quadrant of right female breast ([place])      06/09/2016 Initial Biopsy    Right breast inner quadrant core needle biopsy showed invasive ductal carcinoma and DCIS, grade 1-2      06/17/2016 Initial Biopsy    Right axillary lymph node core needle biopsy showed metastatic carcinoma      06/17/2016 Receptors her2    Axillary node biopsy showed ER 100% positive, PR 95% positive, HER-2 negative      06/19/2016 Imaging    Bilateral breast MRI showed locations in the lower inner right breast, largest 2.7X1.3X1.6cm, biopsy clips in 2 of this areas. There are abnormal right axillary lymph nodes showing second cortical's, no evidence of malignancy in the left breast.      07/29/2016 Surgery    Right lumpectomy and ALND      07/29/2016  Pathology Results    Right lumpectomy showed G3 IDC, DCIS, margins (-), LVI(-).  1 of 16 nodes was positive       07/29/2016 Miscellaneous    Mammaprint showed high risk disease, luminal type B      09/09/2016 - 11/12/2016 Adjuvant Chemotherapy    Docetaxel and Cytoxan (TC) every 3 weeks      12/24/2016 - 02/03/2017 Radiation Therapy    Adjuvant breast radiation 12/24/16 - 02/03/17 : Right Breast treated to 50.4 Gy in 28 fractions. Right Axilla treated to 45 Gy in 25 fractions.      02/17/2017 -  Anti-estrogen oral therapy    Letrozole 2.5 mg daily       03/23/2017 Imaging    DG Bone Density 03/23/17 ASSESSMENT: The BMD measured at Femur Neck Right is 0.809 g/cm2 with a T-score of -1.6. This patient is considered osteopenic according to [facility]) criteria.       HISTORY OF PRESENTING ILLNESS:  Tiffany Arnold 76 y.o. female is here because of her recently diagnosed left breast cancer. She presents to my clinic with her friend, who is a Marine scientist.   Her cancer was discovered by screening mammogram. She had a right breast cyst in 2001, which was removed. She has been doing mammogram once a year. The mammogram and ultrasound on 05/23/2016 showed a suspicious architectural this portion, 1.3 cm, without sonographic correlation.  She underwent core needle biopsy of the right breast mass twice and right axilla node biopsy, one breast biopsy and node biopsy showed invasive ductal carcinoma and DCIS, ER/PR strong positive, HER-2 negative.  She denies any other new symptoms. She has noticed mild fatigued lately, she still works full time in a vet's office, she has IBS, has intermittent constipation and diarrhea. She has arthritis, and both knee replacement and shoulder surgery before, she also has some back pain lately, she takes tylenol occasionally.  She lives with her husband, moderately active. No family history of breast cancer  GYN HISTORY  Menarchal: 13 LMP:  47 Contraceptive: 4-5 years HRT: 3 years  G2P2: no breast feeding, daughter 29 yo and son is 18 yo.   CURRENT THERAPY: Letrozole 2.56m daily since 02/17/17  INTERIM HISTORY: Mrs VReineckereturns for follow up. She has been doing well. She denies any side effects from Letrozole. She denies pain or fatigue. She reports her right breast is slightly red from radiation. 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 Pt uses a walker because she was having falls and occasional dizziness, and it has helped.  MEDICAL HISTORY:  Past Medical History:  Diagnosis Date  . Allergy   . Arthritis   . Asthma   . Cancer ([place] 07/29/2016   right breast  . Cataract of both eyes   . Chicken pox   . Colon polyps   . Dog bite of index finger 07/23/2016   Left index finger  . GERD (gastroesophageal reflux disease)   . History of radiation therapy 12/24/16- 02/03/17   Right Breast 50.4 Gy in 28 fractions, Right Axilla 45 Gy in 25 fractions.  . Hyperlipidemia   . Hypertension   . IBS (irritable bowel syndrome)   . Phlebitis   . Pneumonia    hx of several yrs ago  . Shortness of breath dyspnea    with exertion only  . Sleep apnea    wears CPAP machine nightly    SURGICAL HISTORY: Past Surgical History:  Procedure Laterality Date  . BACK SURGERY     lower  . BREAST CYST EXCISION Right 2001   negative  . BREAST LUMPECTOMY WITH NEEDLE LOCALIZATION AND AXILLARY LYMPH NODE DISSECTION Right 07/29/2016   Procedure: RIGHT BREAST LUMPECTOMY WITH DOUBLE NEEDLE LOCALIZATION AND COMPLETE RIGHT AXILLARY LYMPH NODE DISSECTION;  Surgeon: HFanny Skates MD;  [facility]  Service: General;  Laterality: Right;  . BREAST SURGERY Left 2000   Biopsy  . BUNIONECTOMY Bilateral 1998   great toe fusion on right foot  . COLONOSCOPY W/ POLYPECTOMY    . HERN[place] REPAIR  [id number]  [facility]  [facility]  . PORTACATH PLACEMENT N/A 09/05/2016   Procedure: INSERTION PORT-A-CATH;  Surgeon: HFanny Skates MD;   Location: [facility];  Service: General;  Laterality: N/A;  . REPLACEMENT TOTAL KNEE Bilateral 2007  . TOTAL SHOULDER REPLACEMENT Left 2007    SOCIAL HISTORY: Social History   Social History  . Marital status: Married    Spouse name: N/A  . Number of children: N/A  . Years of education: N/A   Occupational History  . Not on file.   Social History Main Topics  . Smoking status: Former Smoker    Packs/day: 2.00    Years: 25.00    Quit date: 12/22/1990  . Smokeless tobacco: Never Used     Comment: quit 24 years ago  . Alcohol use Yes  Comment: rare  . Drug use: No  . Sexual activity: Not Currently   Other Topics Concern  . Not on file   Social History Narrative  . No narrative on file    FAMILY HISTORY: Family History  Problem Relation Age of Onset  . Arthritis Mother   . Stroke Mother   . Hypertension Mother   . Cancer Father     Prostate  . Stroke Father   . Hypertension Father   . Hypertension Maternal Grandmother   . Rheum arthritis Maternal Grandfather   . Stroke Maternal Grandfather   . Hypertension Maternal Grandfather   . Cancer Paternal Grandmother     Colon  . Hypertension Paternal Grandmother   . Hypertension Paternal Grandfather   . Breast cancer Neg Hx     ALLERGIES:  is allergic to other.  MEDICATIONS:  Current Outpatient Prescriptions  Medication Sig Dispense Refill  . albuterol-ipratropium (COMBIVENT) 18-103 MCG/ACT inhaler Inhale 2 puffs into the lungs every 4 (four) hours as needed for wheezing or shortness of breath.     . ALPRAZolam (XANAX) 0.25 MG tablet TAKE ONE TABLET BY MOUTH TWICE DAILY AS NEEDED 60 tablet 0  . aspirin 81 MG tablet Take 81 mg by mouth daily.    . Calcium Carbonate-Vitamin D (CALCIUM-VITAMIN D) 500-200 MG-UNIT tablet Take 1 tablet by mouth daily.    . cetirizine (ZYRTEC) 10 MG tablet Take 10 mg by mouth daily.    [place] Kitchen glipiZIDE (GLUCOTROL) 5 MG tablet Take 1 tablet (5 mg total) by mouth 2 (two) times daily before a  meal. 90 tablet 0  . glucosamine-chondroitin 500-400 MG tablet Take 1 tablet by mouth 2 (two) times daily.    Mar[place] Kitchen letrozole (FEMARA) 2.5 MG tablet Take 1 tablet (2.5 mg total) by mouth daily. 30 tablet 5  . lidocaine-prilocaine (EMLA) cream Apply to port at least 1 hour prior to chemo 30 g 1  . Olmesartan-Amlodipine-HCTZ 40-5-25 MG TABS Take 1 tablet by mouth daily. 90 tablet 1  . oxybutynin (DITROPAN-XL) 10 MG 24 hr tablet Take 1 tablet (10 mg total) by mouth at bedtime. 90 tablet 1  . potassium chloride (K-DUR) 10 MEQ tablet Take 1 tablet (10 mEq total) by mouth 2 (two) times daily. 180 tablet 0  . pravastatin (PRAVACHOL) 40 MG tablet Take 1 tablet (40 mg total) by mouth daily. 90 tablet 1  . ranitidine (ZANTAC) 150 MG tablet Take 150 mg by mouth every evening.     . sertraline (ZOLOFT) 25 MG tablet TAKE ONE (1) TABLET EACH DAY 30 tablet 2  . alendronate (FOSAMAX) 70 MG tablet Take 1 tablet (70 mg total) by mouth once a week. Take with a full glass of water on an empty stomach. 4 tablet 5  . Cholecalciferol (VITAMIN D3) 1000 units CAPS Take by mouth 2 (two) times daily.    . diphenoxylate-atropine (LOMOTIL) 2.5-0.025 MG tablet Take 2 tablets by mouth 4 (four) times daily as needed for diarrhea or loose stools. (Patient not taking: Reported on 04/01/2017) 30 tablet 0  . EPINEPHrine 0.3 mg/0.3 mL IJ SOAJ injection Inject 0.3 mg into the muscle once as needed (ALLERGIC REACTION).     . magic mouthwash SOLN Swish and Spit 5-10 mLs 4 times a day as needed. (Patient not taking: Reported on 04/01/2017) 240 mL 1  . ondansetron (ZOFRAN) 8 MG tablet TAKE ONE TABLET BY MOUTH TWICE DAILY AS NEEDED FOR  REFRACTORY  NAUSEA/VOMITING.  START  ON  DAY  3  AFTER  CHEMO. (Patient not taking: Reported on 04/01/2017) 30 tablet 1  . prochlorperazine (COMPAZINE) 10 MG tablet TAKE ONE TABLET BY MOUTH EVERY 6 HOURS AS NEEDED FOR NAUSEA AND VOMITING (Patient not taking: Reported on 04/01/2017) 30 tablet 1  . vitamin B-12  (CYANOCOBALAMIN) 1000 MCG tablet Take 1,000 mcg by mouth daily.     No current facility-administered medications for this visit.     REVIEW OF SYSTEMS:   Constitutional: Denies fevers, chills or abnormal night sweats (+) falls Eyes: Denies blurriness of vision, double vision or watery eyes Ears, nose, mouth, throat, and face: Denies mucositis or sore throat Respiratory: Denies cough, dyspnea or wheezes Cardiovascular: Denies palpitation, chest discomfort or lower extremity swelling Gastrointestinal:  Denies heartburn or change in bowel habits Skin: Denies abnormal skin rashes Lymphatics: Denies new lymphadenopathy or easy bruising. (+) Right arm lymphedema with sleeve. Neurological:Denies numbness, tingling or new weaknesses (+) dizziness Behavioral/Psych: Mood is stable, no new changes  All other systems were reviewed with the patient and are negative.  PHYSICAL EXAMINATION: ECOG PERFORMANCE STATUS: 1  Vitals:   04/01/17 1210  BP: (!) 155/63  Pulse: (!) 53  Temp: 97.5 F (36.4 C)   Filed Weights   04/01/17 1210  Weight: 199 lb 1.6 oz (90.3 kg)   GENERAL:alert, no distress and comfortable SKIN: skin color, texture, turgor are normal, no rashes or significant lesions EYES: normal, conjunctiva are pink and non-injected, sclera clear OROPHARYNX:no exudate, no erythema and lips, buccal mucosa, and tongue normal  NECK: supple, thyroid normal size, non-tender, without nodularity LYMPH:  no palpable lymphadenopathy in the cervical, axillary or inguinal (+) Right arm lymphedema LUNGS: clear to auscultation and percussion with normal breathing effort HEART: regular rate & rhythm and no murmurs and no lower extremity edema ABDOMEN:abdomen soft, non-tender and normal bowel sounds Musculoskeletal:no cyanosis of digits and no clubbing  PSYCH: alert & oriented x 3 with fluent speech NEURO: no focal motor/sensory deficits Breasts: Breast inspection showed them to be symmetrical with no  nipple discharge. (+) Diffuse skin pigmentation of the right breast secondary to radiation. Mild lymphedema of the surgical scar of the right breast. Palpation of the breasts and axilla revealed no obvious mass that I could appreciate.  LABORATORY DATA:  I have reviewed the data as listed CBC Latest Ref Rng & Units 04/01/2017 01/29/2017 12/24/2016  WBC 3.9 - 10.3 10e3/uL 5.1 6.3 10.4(H)  Hemoglobin 11.6 - 15.9 g/dL 11.6 12.3 12.3  Hematocrit 34.8 - 46.6 % 34.8 38.9 38.5  Platelets 145 - 400 10e3/uL 263 265 346   CMP Latest Ref Rng & Units 04/01/2017 03/05/2017 01/29/2017  Glucose 70 - 140 mg/dl 62(L) 47(LL) 88  BUN 7.0 - 26.0 mg/dL 18.6 25(H) 17.0  Creatinine 0.6 - 1.1 mg/dL 0.7 0.78 0.8  Sodium 136 - 145 mEq/L 141 137 140  Potassium 3.5 - 5.1 mEq/L 3.7 3.1(L) 3.9  Chloride 96 - 112 mEq/L - 102 -  CO2 22 - 29 mEq/L _0 Calcium 8.4 - 10.4 mg/dL 10.4 10.4 10.7(H)  Total Protein 6.4 - 8.3 g/dL 6.1(L) 6.6 6.6  Total Bilirubin 0.20 - 1.20 mg/dL 0.33 0.4 0.35  Alkaline Phos 40 - 150 U/L 65 52 62  AST 5 - 34 U/L _1 ALT 0 - 55 U/L _2 PATHOLOGY REPORT  Diagnosis 06/03/2016 Breast, right, needle core biopsy, ILQ focal 1.3 cm asymmetry/distortion - ATYPICAL DUCTAL HYPERPLASIA WITH CALCIFICATIONS. - FIBROCYSTIC CHANGES WITH CALCIFICATIONS. -  SEE COMMENT. Microscopic Comment The results were called to [facility] on 06/04/16. (JBK:ds 06/04/16)   Diagnosis 06/09/2016 Breast, right, needle core biopsy, inner - INVASIVE DUCTAL CARCINOMA. - DUCTAL CARCINOMA IN SITU. - SEE COMMENT. Microscopic Comment The carcinoma appears grade 1-2. A breast prognostic profile will be performed and the results reported separately. The results were called to [facility] on 06/10/2016. (JBK:ecj 06/10/2016) Results: HER2 - NEGATIVE RATIO OF HER2/CEP17 SIGNALS 1.55 AVERAGE HER2 COPY NUMBER PER CELL 2.25  Results: IMMUNOHISTOCHEMICAL AND MORPHOMETRIC ANALYSIS  PERFORMED MANUALLY Estrogen Receptor: 100%, POSITIVE, STRONG STAINING INTENSITY Progesterone Receptor: 100%, POSITIVE, STRONG STAINING INTENSITY Proliferation Marker Ki67: 40%   Diagnosis 06/17/2016 Lymph node, needle/core biopsy, right, inferior, axilla to far lateral breast - METASTATIC CARCINOMA, SEE COMMENT. Microscopic Comment The morphology is consistent with the patient breast carcinoma. Prognostic markers will be ordered and reported in an addendum. The case was called to [facility] on 06/18/2016. Results: IMMUNOHISTOCHEMICAL AND MORPHOMETRIC ANALYSIS PERFORMED MANUALLY Estrogen Receptor: 100%, POSITIVE, STRONG STAINING INTENSITY Progesterone Receptor: 95%, POSITIVE, STRONG STAINING INTENSITY  Results: HER2 - NEGATIVE RATIO OF HER2/CEP17 SIGNALS 1.25 AVERAGE HER2 COPY NUMBER PER CELL 2.00  Diagnosis 07/29/2016 1. Breast, lumpectomy, Right INVASIVE DUCTAL CARCINOMA, GRADE 3, SPANNING 1.5 CM DUCTAL CARCINOMA IN SITU IS PRESENT ALL MARGINS OF RESECTION ARE NEGATIVE FOR CARCINOMA 2. Lymph nodes, regional resection, Right axillary contents METASTATIC BREAST DUCTAL CARCINOMA IN ONE OF SIXTEEN LYMPH NODES (1/16) Specimen, including laterality and lymph node sampling (sentinel, non-sentinel): Right partial breast and regional lymph nodes Procedure: Lumpectomy Histologic type: Ductal carcinoma Grade: 3 Tubule formation: 3 Nuclear pleomorphism: 2 Mitotic:3 Tumor size (gross measurement or glass slide measurement): 1.5 cm Margins: Invasive, distance to closest margin: 0.7 cm In-situ, distance to closest margin: 0.7 cm If margin positive, focally or broadly: NA Lymphovascular invasion: Not identified Ductal carcinoma in situ: Present Grade: 3 Extensive intraductal component: moderate Lobular neoplasia: Negative Tumor focality: Focal Treatment effect: Negative If present, treatment effect in breast tissue, lymph nodes or both: NA Extent of tumor: Skin:  Negative Nipple: Negative Skeletal muscle: Negative Lymph nodes: Examined: 0 Sentinel 16 Non-sentinel 16 Total Lymph nodes with metastasis: 1 Isolated tumor cells (< 0.2 mm): 0 Micrometastasis: (> 0.2 mm and < 2.0 mm): 0 Macrometastasis: (> 2.0 mm): 1 Extracapsular extension: Present Breast prognostic profile: Estrogen receptor: 100% Progesterone receptor: 100% Her 2 neu: Negative Ki-67: 40% Non-neoplastic breast: Unremarkable TNM: pT1c, pN1  RADIOGRAPHIC STUDIES: I have personally reviewed the radiological images as listed and agreed with the findings in the report. Dg Bone Density  Result Date: 03/23/2017 EXAM: DUAL X-RAY ABSORPTIOMETRY (DXA) FOR BONE MINERAL DENSITY IMPRESSION: Dear Dr  Truitt Merle, Your patient Tiffany Arnold completed a FRAX assessment on 03/23/2017 using the [place] (analysis version: 14.10) manufactured by [employer]. The following summarizes the results of our evaluation. PATIENT BIOGRAPHICAL: Name: Kenneshia, Rehm Patient ID: [id number] Birth Date: [birth year] Height:    61.0 in. Gender:     Female    [age]       Weight:    198.6 lbs. Ethnicity:  White                            Exam Date: 03/23/2017 FRAX* RESULTS:  (version: 3.5) 10-year Probability of Fracture1 Major Osteoporotic Fracture2 Hip Fracture 16.3% 6.3% Population: [place] (Caucasian) Risk  Factors: Family Hist. (Parent hip fracture) Based on Femur (Right) Neck BMD 1 -The 10-year probability of fracture may be lower than reported if the patient has received treatment. 2 -Major Osteoporotic Fracture: Clinical Spine, Forearm, Hip or Shoulder *FRAX is a Materials engineer of the [employer] for Metabolic Bone Disease, a [place] (WHO) [facility]. ASSESSMENT: The probability of a major osteoporotic fracture is 16.3% within the next ten years. The probability of a hip fracture is 6.3% within the next ten years. Dear Dr Truitt Merle, Your patient Stephaie Dardis completed a BMD test on 03/23/2017 using the [place] (analysis version: 14.10) manufactured by [employer]. The following summarizes the results of our evaluation. PATIENT BIOGRAPHICAL: Name: Calea, Hribar Patient ID: [id number] Birth Date: 24-Jul-1941 Height: 61.0 in. Gender: Female Exam Date: 03/23/2017 Weight: 198.6 lbs. Indications: Advanced Age, arthritis, Caucasian, diabetic, Family History of Fracture, Height Loss, high risk meds, hx breast ca, knee replacement, POSTmenopausal, Recurrent Falls, Family Hist. (Parent hip fracture) Fractures: Treatments: 81 MG ASPIRIN, Calcium, Combivent, glipizide, letrozole, Vitamin D ASSESSMENT: The BMD measured at Femur Neck Right is 0.809 g/cm2 with a T-score of -1.6. This patient is considered osteopenic according to [facility]) criteria.L3was excluded due to degenerative changes. Site Region Measured Measured WHO Young Adult BMD Date       Age      Classification T-score DualFemur Neck Right 03/23/2017 [age] Osteopenia -1.6 0.809 g/cm2 AP Spine L1-L4 (L3) 03/23/2017 75.3 Normal -0.4 1.122 g/cm2 World Health Organization [facility]) criteria for post-menopausal, Caucasian Women: Normal:       T-score at or above -1 SD Osteopenia:   T-score between -1 and -2.5 SD Osteoporosis: T-score at or below -2.5 SD RECOMMENDATIONS: [place] recommends that FDA-approved medical therapies be considered in postemenopausal women and men age 64 or older with a: 1. Hip or vertebral (clinical or morphometric) fracture. 2. T-score of < -2.5at the spine or hip. 3. Ten-year fracture probability by FRAX of 3% or greater for hip fracture or 20% or greater for major osteoporotic fracture. All treatment decisions require clinical judgment and consideration of individual patient factors, including patient preferences, co-morbidities, previous drug use, risk factors not captured in the FRAX model  (e.g. falls, vitamin D deficiency, increased bone turnover, interval significant decline in bone density) and possible under - or over-estimation of fracture risk by FRAX. All patients should ensure an adequate intake of dietary calcium (1200 mg/d) and vitamin D (800 IU daily) unless contraindicated. FOLLOW-UP: People with diagnosed cases of osteoporosis or at high risk for fracture should have regular bone mineral density tests. For patients eligible for Medicare, routine testing is allowed once every 2 years. The testing frequency can be increased to one year for patients who have rapidly progressing disease, those who are receiving or discontinuing medical therapy to restore bone mass, or have additional risk factors. I have reviewed this report, and agree with the above findings. [facility] Electronically Signed   By: Lahoma Crocker M.D.   On: 03/23/2017 14:09   Diagnostic mammogram and ultrasound of right breast including right axillary 05/23/2016 IMPRESSION: Suspicious architectural distortion within the lower inner quadrant of the right breast, measuring 1.3 cm greatest dimension, without sonographic correlate. This distortion could conceivably be related to the patient's earlier surgical excision biopsy perform in 2001 (patient states that this earlier surgical excision was in this same region of her right breast), however, exclusion of a neoplastic cause  is needed. As such, stereotactic-guided biopsy, with 3D tomosynthesis, is recommended for this suspicious finding.  US Venous Img Upper Uni Right 10/09/2016 IMPRESSION: No evidence of DVT within the right upper extremity.  DG Bone Density 03/23/17 ASSESSMENT: The BMD measured at Femur Neck Right is 0.809 g/cm2 with a T-score of -1.6. This patient is considered osteopenic according to [facility]) criteria.L3was excluded due to degenerative changes. Site Region Measured Measured WHO Young Adult BMD Date        Age      Classification T-score DualFemur Neck Right 03/23/2017 [age] Osteopenia -1.6 0.809 g/cm2  ASSESSMENT & PLAN: 76 y.o.Caucasian female, with mammogram discovered right breast cancer.  1. 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  -We previously reviewed her mammaprint genomic test result, which showed luminal type B, high risk, average 10-year risk of recurrence without adjuvant therapy is 29% -given the high risk disease, I strongly recommend her to consider adjuvant chemo. She is relatively elderly, with some medical comorbilities, but still has good PS, so I recommended adjuvant docetaxel and cytoxan every 3 weeks for 4 cycles   -the goal of chemo is curative  -She has completed adjuvant chemotherapy -Adjuvant radiation. 12/24/16 - 02/03/17 : Right Breast treated to 50.4 Gy in 28 fractions. Right Axilla treated to 45 Gy in 25 fractions. -She started adjuvant letrozole, tolerating well so far, we'll continue for total 527 years. -We previously discussed her breast cancer surveillance. She will continue continue screening mammogram, self exam, and routine follow-up with [place] for labs and exam. -I encouraged her to have healthy diet and exercise regularly, and try to lose some weight. -Labs reviewed, physical exam was unremarkable, no clinical concern for cancer recurrence. -Bilateral screening mammogram will be scheduled in May 2018.  2. Hyperglycemia -She had borderline hyperglycemia before. Her random glucose has been 180-200, this is likely steroids induced hyperglycemia. -I strongly encouraged her follow-up with her primary care physician. -Her blood glucose has improved after she completed chemotherapy  3.  Right upper extremity lymphedema -She is quite significant right upper extremity lymphedema after the axillary node dissection -She has completed physical therapy, I encouraged her to continue manual drainage at home, and wear sleeves  4. Hypertension and arthritis -She will continue medication and follow-up with her primary care physician -We previously discussed her that chemotherapy may affect her oral intake and her blood pressure, she is on 3 blood pressure medications including HCTZ, I strongly encouraged her to monitor her blood pressure at home, and we may need to adjust her medication if her blood pressure drops. -We discussed how the AI could effect her arthritis.   5. Morbid obesity -I encouraged her to have healthy diet and exercise regularly, try to lose some weight.  6. Loose stool, IBS, and bowel incontinence -I encouraged the patient to begin taking probiotics and imodium to manage her bowel symptoms -I also encouraged her to try pelvic exercises for incontinence -She does not follow with GI for IBS. I encouraged her to follow with GI if this worsens  7. Bone Health -We discussed how the AI could effect her bones.  -Bone scan 03/23/17. Femur Neck Right T-score -1.6; high risk osteopenic. -I prescribed Fosamax on 04/01/17 to  take 1 tablet once a week for 4 weeks. She will take after seeing her denist. -We discussed Xgeva injections.  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 She is scheduled to follow up with her dentist in May 2018. I will give this after if her dental health is good. -I advised her to take calcium and vitamin D.  Plan -Continue Letrozole. -Take calcium and vitamin D. -Lab and f/u in 3 months. -Mammogram in May 2018. -She will be seen by PT for right arm edema (our PT happens to be in our clinic today). -She will see her dentist in May 2018. She will take Fosamax when she is cleared. I prescribed fosamax for her today  -Refilled  Letrozole.  All questions were answered. The patient knows to call the clinic with any problems, questions or concerns.  I spent 30 minutes counseling the patient face to face. The total time spent in the appointment was 35 minutes and more than 50% was on counseling.    Truitt Merle, MD 04/01/2017   This document serves as a record of services personally performed by Truitt Merle, MD. It was created on her behalf by Darcus Austin, a trained medical scribe. The creation of this record is based on the scribe's personal observations and the provider's statements to them. This document has been checked and approved by the attending provider.

## 2017-04-01 ENCOUNTER — Ambulatory Visit (HOSPITAL_BASED_OUTPATIENT_CLINIC_OR_DEPARTMENT_OTHER): Payer: Medicare PPO

## 2017-04-01 ENCOUNTER — Other Ambulatory Visit: Payer: Self-pay | Admitting: *Deleted

## 2017-04-01 ENCOUNTER — Telehealth: Payer: Self-pay | Admitting: Hematology

## 2017-04-01 ENCOUNTER — Other Ambulatory Visit (HOSPITAL_BASED_OUTPATIENT_CLINIC_OR_DEPARTMENT_OTHER): Payer: Medicare PPO

## 2017-04-01 ENCOUNTER — Ambulatory Visit (HOSPITAL_BASED_OUTPATIENT_CLINIC_OR_DEPARTMENT_OTHER): Payer: Medicare PPO | Admitting: Hematology

## 2017-04-01 VITALS — BP 155/63 | HR 53 | Temp 97.5°F | Wt 199.1 lb

## 2017-04-01 DIAGNOSIS — C773 Secondary and unspecified malignant neoplasm of axilla and upper limb lymph nodes: Secondary | ICD-10-CM

## 2017-04-01 DIAGNOSIS — I890 Lymphedema, not elsewhere classified: Secondary | ICD-10-CM | POA: Diagnosis not present

## 2017-04-01 DIAGNOSIS — Z95828 Presence of other vascular implants and grafts: Secondary | ICD-10-CM

## 2017-04-01 DIAGNOSIS — M85861 Other specified disorders of bone density and structure, right lower leg: Secondary | ICD-10-CM

## 2017-04-01 DIAGNOSIS — M85862 Other specified disorders of bone density and structure, left lower leg: Secondary | ICD-10-CM

## 2017-04-01 DIAGNOSIS — Z170 Estrogen receptor positive status [ER+]: Secondary | ICD-10-CM

## 2017-04-01 DIAGNOSIS — C50311 Malignant neoplasm of lower-inner quadrant of right female breast: Secondary | ICD-10-CM

## 2017-04-01 DIAGNOSIS — R739 Hyperglycemia, unspecified: Secondary | ICD-10-CM

## 2017-04-01 DIAGNOSIS — M810 Age-related osteoporosis without current pathological fracture: Secondary | ICD-10-CM | POA: Insufficient documentation

## 2017-04-01 DIAGNOSIS — E119 Type 2 diabetes mellitus without complications: Secondary | ICD-10-CM

## 2017-04-01 DIAGNOSIS — K589 Irritable bowel syndrome without diarrhea: Secondary | ICD-10-CM | POA: Diagnosis not present

## 2017-04-01 DIAGNOSIS — Z79811 Long term (current) use of aromatase inhibitors: Secondary | ICD-10-CM | POA: Diagnosis not present

## 2017-04-01 DIAGNOSIS — I10 Essential (primary) hypertension: Secondary | ICD-10-CM

## 2017-04-01 LAB — COMPREHENSIVE METABOLIC PANEL
ALT: 17 U/L (ref 0–55)
AST: 19 U/L (ref 5–34)
Albumin: 3.5 g/dL (ref 3.5–5.0)
Alkaline Phosphatase: 65 U/L (ref 40–150)
Anion Gap: 11 mEq/L (ref 3–11)
BUN: 18.6 mg/dL (ref 7.0–26.0)
CHLORIDE: 105 meq/L (ref 98–109)
CO2: 25 meq/L (ref 22–29)
Calcium: 10.4 mg/dL (ref 8.4–10.4)
Creatinine: 0.7 mg/dL (ref 0.6–1.1)
EGFR: 87 mL/min/{1.73_m2} — AB (ref >90)
GLUCOSE: 62 mg/dL — AB (ref 70–140)
POTASSIUM: 3.7 meq/L (ref 3.5–5.1)
SODIUM: 141 meq/L (ref 136–145)
Total Bilirubin: 0.33 mg/dL (ref 0.20–1.20)
Total Protein: 6.1 g/dL — ABNORMAL LOW (ref 6.4–8.3)

## 2017-04-01 LAB — CBC WITH DIFFERENTIAL/PLATELET
BASO%: 0.6 % (ref 0.0–2.0)
BASOS ABS: 0 10*3/uL (ref 0.0–0.1)
EOS ABS: 0.1 10*3/uL (ref 0.0–0.5)
EOS%: 2 % (ref 0.0–7.0)
HCT: 34.8 % (ref 34.8–46.6)
HGB: 11.6 g/dL (ref 11.6–15.9)
LYMPH%: 25.5 % (ref 14.0–49.7)
MCH: 28.3 pg (ref 25.1–34.0)
MCHC: 33.4 g/dL (ref 31.5–36.0)
MCV: 84.7 fL (ref 79.5–101.0)
MONO#: 0.5 10*3/uL (ref 0.1–0.9)
MONO%: 9.8 % (ref 0.0–14.0)
NEUT#: 3.2 10*3/uL (ref 1.5–6.5)
NEUT%: 62.1 % (ref 38.4–76.8)
Platelets: 263 10*3/uL (ref 145–400)
RBC: 4.11 10*6/uL (ref 3.70–5.45)
RDW: 15.7 % — AB (ref 11.2–14.5)
WBC: 5.1 10*3/uL (ref 3.9–10.3)
lymph#: 1.3 10*3/uL (ref 0.9–3.3)

## 2017-04-01 MED ORDER — SODIUM CHLORIDE 0.9% FLUSH
10.0000 mL | INTRAVENOUS | Status: DC | PRN
  Administered 2017-04-01: 10 mL via INTRAVENOUS
  Filled 2017-04-01: qty 10

## 2017-04-01 MED ORDER — LETROZOLE 2.5 MG PO TABS: 2.5000 mg | ORAL_TABLET | Freq: Every day | ORAL | 5 refills | Status: DC

## 2017-04-01 MED ORDER — HEPARIN SOD (PORK) LOCK FLUSH 100 UNIT/ML IV SOLN
500.0000 [IU] | Freq: Once | INTRAVENOUS | Status: AC | PRN
  Administered 2017-04-01: 500 [IU] via INTRAVENOUS
  Filled 2017-04-01: qty 5

## 2017-04-01 MED ORDER — ALENDRONATE SODIUM 70 MG PO TABS: 70.0000 mg | ORAL_TABLET | ORAL | 5 refills | Status: DC

## 2017-04-01 NOTE — Telephone Encounter (Signed)
Gave patient AVS and calender per 4/11 los. Per Butch Penny at [facility] said she will sent a message to schedulers about the Mammo ordered and they will contact that patient.

## 2017-04-02 ENCOUNTER — Encounter: Payer: Self-pay | Admitting: Hematology

## 2017-04-04 ENCOUNTER — Other Ambulatory Visit: Payer: Self-pay | Admitting: Internal Medicine

## 2017-04-06 ENCOUNTER — Telehealth: Payer: Self-pay | Admitting: Physical Therapy

## 2017-04-06 ENCOUNTER — Other Ambulatory Visit: Payer: Self-pay | Admitting: Hematology

## 2017-04-06 DIAGNOSIS — C50311 Malignant neoplasm of lower-inner quadrant of right female breast: Secondary | ICD-10-CM

## 2017-04-06 DIAGNOSIS — Z170 Estrogen receptor positive status [ER+]: Principal | ICD-10-CM

## 2017-04-06 NOTE — Telephone Encounter (Signed)
Spoke with pt. And Tiffany Arnold last week at the [facility] per Dr. Ernestina Penna request. She had concerns about the pt's arm increasing in size due to lymphedema. Tiffany Arnold and Tiffany Arnold had concerns about the financial burden of PT and reported they received a bill for "thousands" from PT. I had our business manager look into Tiffany PT bills and found she is responsible for a $40 copay each visit which resulted in about a $400 bill for 2017 and $280 this year, which she has already paid. Phoned pt's Arnold to explain charges per the patient's request.  She verbalized understanding and expressed frustration with all of Tiffany bills. She agreed to bring the patient to 1 PT visit next week for [place] to look at Tiffany current garments and make recommendations for new garments including what to wear at night time and how to better manage Tiffany swelling. It appears that as she has returned to work and become more active, Tiffany arm has increased in size. Patient is scheduled for a PT eval on 04/15/17. Tiffany Arnold, [place] 04/06/17 2:51 PM

## 2017-04-07 DIAGNOSIS — G4733 Obstructive sleep apnea (adult) (pediatric): Secondary | ICD-10-CM | POA: Diagnosis not present

## 2017-04-08 DIAGNOSIS — H25043 Posterior subcapsular polar age-related cataract, bilateral: Secondary | ICD-10-CM | POA: Diagnosis not present

## 2017-04-08 DIAGNOSIS — H353132 Nonexudative age-related macular degeneration, bilateral, intermediate dry stage: Secondary | ICD-10-CM | POA: Diagnosis not present

## 2017-04-08 DIAGNOSIS — H2513 Age-related nuclear cataract, bilateral: Secondary | ICD-10-CM | POA: Diagnosis not present

## 2017-04-08 DIAGNOSIS — H2512 Age-related nuclear cataract, left eye: Secondary | ICD-10-CM | POA: Diagnosis not present

## 2017-04-08 DIAGNOSIS — H25013 Cortical age-related cataract, bilateral: Secondary | ICD-10-CM | POA: Diagnosis not present

## 2017-04-09 DIAGNOSIS — Z6841 Body Mass Index (BMI) 40.0 and over, adult: Secondary | ICD-10-CM | POA: Diagnosis not present

## 2017-04-09 DIAGNOSIS — Z452 Encounter for adjustment and management of vascular access device: Secondary | ICD-10-CM | POA: Diagnosis not present

## 2017-04-09 DIAGNOSIS — C50911 Malignant neoplasm of unspecified site of right female breast: Secondary | ICD-10-CM | POA: Diagnosis not present

## 2017-04-09 DIAGNOSIS — C773 Secondary and unspecified malignant neoplasm of axilla and upper limb lymph nodes: Secondary | ICD-10-CM | POA: Diagnosis not present

## 2017-04-09 DIAGNOSIS — E119 Type 2 diabetes mellitus without complications: Secondary | ICD-10-CM | POA: Diagnosis not present

## 2017-04-09 DIAGNOSIS — I82629 Acute embolism and thrombosis of deep veins of unspecified upper extremity: Secondary | ICD-10-CM | POA: Diagnosis not present

## 2017-04-09 DIAGNOSIS — I10 Essential (primary) hypertension: Secondary | ICD-10-CM | POA: Diagnosis not present

## 2017-04-15 ENCOUNTER — Ambulatory Visit: Payer: Medicare PPO | Attending: Radiation Oncology | Admitting: Physical Therapy

## 2017-04-15 DIAGNOSIS — I890 Lymphedema, not elsewhere classified: Secondary | ICD-10-CM

## 2017-04-15 NOTE — Therapy (Signed)
[place], [place], [address] Phone: [phone]   Fax:  [phone]  Physical Therapy Evaluation  Patient Details  Name: Tiffany Arnold MRN: [id number] Date of Birth: 11/30/1941 Referring Provider: Dr. Eppie Gibson  Encounter Date: 04/15/2017      PT End of Session - 04/15/17 1415    Visit Number 1   Number of Visits 4   Date for PT Re-Evaluation 05/22/17   PT Start Time 8588   PT Stop Time 1400   PT Time Calculation (min) 55 min   Activity Tolerance Patient tolerated treatment well   Behavior During Therapy [facility] for tasks assessed/performed      Past Medical History:  Diagnosis Date  . Allergy   . Arthritis   . Asthma   . Cancer ([place]) 07/29/2016   right breast  . Cataract of both eyes   . Chicken pox   . Colon polyps   . Dog bite of index finger 07/23/2016   Left index finger  . GERD (gastroesophageal reflux disease)   . History of radiation therapy 12/24/16- 02/03/17   Right Breast 50.4 Gy in 28 fractions, Right Axilla 45 Gy in 25 fractions.  . Hyperlipidemia   . Hypertension   . IBS (irritable bowel syndrome)   . Phlebitis   . Pneumonia    hx of several yrs ago  . Shortness of breath dyspnea    with exertion only  . Sleep apnea    wears CPAP machine nightly    Past Surgical History:  Procedure Laterality Date  . BACK SURGERY     lower  . BREAST CYST EXCISION Right 2001   negative  . BREAST LUMPECTOMY WITH NEEDLE LOCALIZATION AND AXILLARY LYMPH NODE DISSECTION Right 07/29/2016   Procedure: RIGHT BREAST LUMPECTOMY WITH DOUBLE NEEDLE LOCALIZATION AND COMPLETE RIGHT AXILLARY LYMPH NODE DISSECTION;  Surgeon: Fanny Skates, MD;  Location: [place];  Service: General;  Laterality: Right;  . BREAST SURGERY Left 2000   Biopsy  . BUNIONECTOMY Bilateral 1998   great toe fusion on right foot  . COLONOSCOPY W/ POLYPECTOMY    . HERNIA REPAIR  5027   [place] SINUS SURGERY   2015  . PORTACATH PLACEMENT N/A 09/05/2016   Procedure: INSERTION PORT-A-CATH;  Surgeon: Fanny Skates, MD;  [facility];  Service: General;  Laterality: N/A;  . REPLACEMENT TOTAL KNEE Bilateral 2007  . TOTAL SHOULDER REPLACEMENT Left 2007    There were no vitals filed for this visit.       Subjective Assessment - 04/15/17 1308    Subjective Has had to mend the compression daytime sleeve because of a couple pulls in it; hasn't washed the velcro garment yet.  The right arm is down in the morning after remeasuring, but increases during the day.  Had the Port taken out last Thursday.   Patient is accompained by: Family member  sister, Dahlia Byes   Pertinent History Right breast lumpectomy with 16 lymph nodes removed on July 29, 2016. Had chemotherapy.  Started XRT 12/24/16 and should finish it 02/03/17.  Has had bilateral knee replacements within the past 5 years. Had left shoulder replaced. Patient has been seen here about three months ago.   Currently in Pain? No/denies               LYMPHEDEMA/ONCOLOGY QUESTIONNAIRE - 04/15/17 1317      What other symptoms do you have   Are you having pitting edema Yes  mildly   Body Site right arm   Stemmer Sign No     Right Upper Extremity Lymphedema   15 cm Proximal to Olecranon Process 34.3 cm   10 cm Proximal to Olecranon Process 37 cm   Olecranon Process 27 cm   15 cm Proximal to Ulnar Styloid Process 27.5 cm   10 cm Proximal to Ulnar Styloid Process 26.3 cm   Just Proximal to Ulnar Styloid Process 17.2 cm   Across Hand at [employer] 18.1 cm   At [place] of 2nd Digit 5.9 cm                [facility] Adult PT Treatment/Exercise - 04/15/17 0001      Self-Care   Other Self-Care Comments  Checked patient current daytime compression garments:  She has Juzo off the shelf [employer] sleeve and glove. 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   Discussed need for replacement day- and nighttime (Velcro) garments.  Daytime garments are worn, and Ready Wrap Velcro garment is too loose and too short.  Suggested replacement of daytime Juzo with a Jobst Bella Strong for better containment; and better fitting ReadyWrap.                        Long Term Clinic Goals - 04/15/17 1421      CC Long Term Goal  #1   Title Patient will know how and where to obtain a new daytime and Velcro compression garment.   Time 2   Period Weeks   Status New     CC Long Term Goal  #2   Title Patient will be independent in self-manual lymph drainage.   Time 2   Period Weeks   Status New            Plan - 04/15/17 1416    Clinical Impression Statement Patient's arm shows nice reductions in circumference measurements compared to the last time she was measured two month ago, and some very significant reductions at upper arm.  Her daytime garments need to be replaced, and I would suggest an upgrade to one level stiffer than the Juzo off the shelf that she has.  Nighttime Velcro Ready Wrap garment is now too loose and too short, so would suggest replacing that too. This is a re-eval today as she returned after a couple of months away and having been discharged then. She also wants to learn self-manual lymph drainage, which she reports she did not learn during prior session.   Rehab Potential Excellent   Clinical Impairments Affecting Rehab Potential previous total joint replacements in other joints with generalized weakness, obesity; caution of left total shoulder replacement  16 lymph nodes removed from right axilla    PT Frequency --  2 visits   PT Duration 4 weeks   PT Treatment/Interventions ADLs/Self Care Home Management;DME Instruction;Gait training;Functional mobility training;Therapeutic activities;Therapeutic exercise;Balance training;Neuromuscular re-education;Patient/family education;Manual techniques;Manual lymph  drainage;Compression bandaging;Taping   PT Next Visit Plan Instruct in self-manual lymph drainage next session; one session after that is for review and answering questions about self-manual lymph drainage.   Recommended Other Services Checking to see whether [employer] will cover replacement garments.  If so, place order through [facility]; if not, patient has been given written information about what is recommended, and she can go to [facility] to get these.   Consulted and  Agree with Plan of Care Patient;Family member/caregiver  sister      Patient will benefit from skilled therapeutic intervention in order to improve the following deficits and impairments:  Increased edema, Obesity, Impaired UE functional use  Visit Diagnosis: Lymphedema, not elsewhere classified - Plan: PT plan of care cert/re-cert      G-Codes - 58/52/77 1422    Functional Assessment Tool Used (Outpatient Only) clinical judgement   Functional Limitation Self care   Self Care Current Status (O2423) At least 20 percent but less than 40 percent impaired, limited or restricted   Self Care Goal Status (N3614) At least 1 percent but less than 20 percent impaired, limited or restricted       Problem List Patient Active Problem List   Diagnosis Date Noted  . Osteopenia 04/01/2017  . Fall 01/06/2017  . Port catheter in place 12/25/2016  . IBS (irritable bowel syndrome) 11/25/2016  . Breast cancer of lower-inner quadrant of right female breast ([place]) 06/27/2016  . OSA on CPAP 06/26/2016  . Type 2 diabetes mellitus without complication ([place]) 43/15/4008  . Severe obesity (BMI >= 40) ([place]) 09/04/2014  . Essential hypertension 09/04/2014  . HLD (hyperlipidemia) 09/04/2014  . Gastroesophageal reflux disease without esophagitis 09/04/2014  . Asthma, mild persistent 09/04/2014  . Urge incontinence 09/04/2014  . Seasonal allergies 09/04/2014  . Generalized anxiety disorder 09/04/2014     SALISBURY,DONNA 04/15/2017, 2:27 PM  [place], [place], [address] Phone: [phone]   Fax:  [phone]  Name: Tiffany Arnold MRN: [id number] Date of Birth: [birth year]  Serafina Royals, PT 04/15/17 2:27 PM

## 2017-04-16 ENCOUNTER — Ambulatory Visit: Payer: Medicare PPO | Admitting: Physical Therapy

## 2017-04-20 ENCOUNTER — Ambulatory Visit: Payer: Medicare PPO | Admitting: Physical Therapy

## 2017-04-20 DIAGNOSIS — I890 Lymphedema, not elsewhere classified: Secondary | ICD-10-CM | POA: Diagnosis not present

## 2017-04-20 NOTE — Patient Instructions (Signed)

## 2017-04-20 NOTE — Therapy (Signed)
[facility] [address] [place], [place], [address] Phone: [phone]   Fax:  [phone]  Physical Therapy Treatment  Patient Details  Name: Tiffany Arnold MRN: [id number] Date of Birth: 10/09/1941 Referring Provider: Dr. Sarah Squire  Encounter Date: 04/20/2017      PT End of Session - 04/20/17 2107    Visit Number 2   Number of Visits 4  3-4   Date for PT Re-Evaluation 05/22/17   PT Start Time 1355   PT Stop Time 1440   PT Time Calculation (min) 45 min   Activity Tolerance Patient tolerated treatment well   Behavior During Therapy WFL for tasks assessed/performed      Past Medical History:  Diagnosis Date  . Allergy   . Arthritis   . Asthma   . Cancer ([facility]) 07/29/2016   right breast  . Cataract of both eyes   . Chicken pox   . Colon polyps   . Dog bite of index finger 07/23/2016   Left index finger  . GERD (gastroesophageal reflux disease)   . History of radiation therapy 12/24/16- 02/03/17   Right Breast 50.4 Gy in 28 fractions, Right Axilla 45 Gy in 25 fractions.  . Hyperlipidemia   . Hypertension   . IBS (irritable bowel syndrome)   . Phlebitis   . Pneumonia    hx of several yrs ago  . Shortness of breath dyspnea    with exertion only  . Sleep apnea    wears CPAP machine nightly    Past Surgical History:  Procedure Laterality Date  . BACK SURGERY     lower  . BREAST CYST EXCISION Right 2001   negative  . BREAST LUMPECTOMY WITH NEEDLE LOCALIZATION AND AXILLARY LYMPH NODE DISSECTION Right 07/29/2016   Procedure: RIGHT BREAST LUMPECTOMY WITH DOUBLE NEEDLE LOCALIZATION AND COMPLETE RIGHT AXILLARY LYMPH NODE DISSECTION;  Surgeon: Haywood Ingram, MD;  [facility];  Service: General;  Laterality: Right;  . BREAST SURGERY Left 2000   Biopsy  . BUNIONECTOMY Bilateral 1998   great toe fusion on right foot  . COLONOSCOPY W/ POLYPECTOMY    . HERNIA REPAIR  2015   [place]mb[place]  . [place]AL SINUS  SURGERY  2015  . PORTACATH PLACEMENT N/A 09/05/2016   Procedure: INSERTION PORT-A-CATH;  Surgeon: Haywood Ingram, MD;  [facility];  Service: General;  Laterality: N/A;  . REPLACEMENT TOTAL KNEE Bilateral 2007  . TOTAL SHOULDER REPLACEMENT Left 2007    There were no vitals filed for this visit.      Subjective Assessment - 04/20/17 1400    Subjective Started eyedrops for cataract surgery today; surgery will be tomorrow.   Currently in Pain? No/denies  but recent stinging in the right breast                         [facility] Adult PT Treatment/Exercise - 04/20/17 0001      Self-Care   Other Self-Care Comments  Gave patient info about getting daytime sleeve and glove as well as Velcro compression garment for nighttime use through [employer], going to [facility]     Manual Therapy   Manual Lymphatic Drainage (MLD) Instructed patient and had her perform the following:  five diaphragmatic breaths, short neck, left axilla and anterior interaxillary anastomosis, right groin and axillo-inguinal anastomosis, and right UE from dorsal hand to shoulder.                  PT Education - 04/20/17 2106    Education provided Yes   Education Details self-manual lymph drainage for right UE   Person(s) Educated Patient   Methods Explanation;Demonstration;Tactile cues;Verbal cues;Handout   Comprehension Verbalized understanding;Returned demonstration;Need further instruction                [facility] Goals - 04/20/17 2112      CC Long Term Goal  #1   Title Patient will know how and where to obtain a new daytime and Velcro compression garment.   Status Achieved     CC Long Term Goal  #2   Title Patient will be independent in self-manual lymph drainage.   Status Partially Met            Plan - 04/20/17 2108    Clinical Impression Statement Patient did fairly well with learning self-manual lymph drainage, but will benefit from review. She was  encouraged to find time to practice this a couple of times before her next appointment, although in the meantime she will have cataract surgery.  She does not expect this to be a problem.    Rehab Potential Excellent   Clinical Impairments Affecting Rehab Potential previous total joint replacements in other joints with generalized weakness, obesity; caution of left total shoulder replacement  16 lymph nodes removed from right axilla    PT Frequency --  2 visits   PT Duration 4 weeks   PT Treatment/Interventions ADLs/Self Care Home Management;DME Instruction;Gait training;Functional mobility training;Therapeutic activities;Therapeutic exercise;Balance training;Neuromuscular re-education;Patient/family education;Manual techniques;Manual lymph drainage;Compression bandaging;Taping   PT Next Visit Plan Review and answers questions about self-manual lymph drainage.  Check if patient got to [facility] about ordering new garments.   Recommended Other Services [employer] will pay for this round of garments.   Consulted and Agree with Plan of Care Patient      Patient will benefit from skilled therapeutic intervention in order to improve the following deficits and impairments:  Increased edema, Obesity, Impaired UE functional use  Visit Diagnosis: Lymphedema, not elsewhere classified     Problem List Patient Active Problem List   Diagnosis Date Noted  . Osteopenia 04/01/2017  . Fall 01/06/2017  . Port catheter in place 12/25/2016  . IBS (irritable bowel syndrome) 11/25/2016  . Breast cancer of lower-inner quadrant of right female breast ([place]) 06/27/2016  . OSA on CPAP 06/26/2016  . Type 2 diabetes mellitus without complication ([place]) 97/67/3419  . Severe obesity (BMI >= 40) ([place]) 09/04/2014  . Essential hypertension 09/04/2014  . HLD (hyperlipidemia) 09/04/2014  . Gastroesophageal reflux disease without esophagitis 09/04/2014  . Asthma, mild persistent 09/04/2014  . Urge incontinence  09/04/2014  . Seasonal allergies 09/04/2014  . Generalized anxiety disorder 09/04/2014    Zymeir Salminen 04/20/2017, 9:14 PM  [place], [place], [address] Phone: [phone]   Fax:  [phone]  Name: ELENER CUSTODIO MRN: [id number] Date of Birth: [birth year]  Serafina Royals, PT 04/20/17 9:14 PM

## 2017-04-21 DIAGNOSIS — H2512 Age-related nuclear cataract, left eye: Secondary | ICD-10-CM | POA: Diagnosis not present

## 2017-04-21 DIAGNOSIS — H25812 Combined forms of age-related cataract, left eye: Secondary | ICD-10-CM | POA: Diagnosis not present

## 2017-04-22 ENCOUNTER — Ambulatory Visit: Payer: Medicare PPO | Attending: Radiation Oncology

## 2017-04-22 DIAGNOSIS — I890 Lymphedema, not elsewhere classified: Secondary | ICD-10-CM | POA: Diagnosis not present

## 2017-04-22 NOTE — Therapy (Addendum)
[place], [place], [address] Phone: [phone]   Fax:  [phone]  Physical Therapy Treatment  Patient Details  Name: Tiffany Arnold MRN: [id number] Date of Birth: Oct 04, 1941 Referring Provider: Dr. Eppie Gibson  Encounter Date: 04/22/2017      PT End of Session - 04/22/17 1446    Visit Number 3   Number of Visits 4  3-4   Date for PT Re-Evaluation 05/22/17   PT Start Time 1405  Pt arrived at wrong appt time but was able to see her due to opening in schedule   PT Stop Time 1444   PT Time Calculation (min) 39 min   Activity Tolerance Patient tolerated treatment well   Behavior During Therapy [facility] for tasks assessed/performed      Past Medical History:  Diagnosis Date  . Allergy   . Arthritis   . Asthma   . Cancer ([place]) 07/29/2016   right breast  . Cataract of both eyes   . Chicken pox   . Colon polyps   . Dog bite of index finger 07/23/2016   Left index finger  . GERD (gastroesophageal reflux disease)   . History of radiation therapy 12/24/16- 02/03/17   Right Breast 50.4 Gy in 28 fractions, Right Axilla 45 Gy in 25 fractions.  . Hyperlipidemia   . Hypertension   . IBS (irritable bowel syndrome)   . Phlebitis   . Pneumonia    hx of several yrs ago  . Shortness of breath dyspnea    with exertion only  . Sleep apnea    wears CPAP machine nightly    Past Surgical History:  Procedure Laterality Date  . BACK SURGERY     lower  . BREAST CYST EXCISION Right 2001   negative  . BREAST LUMPECTOMY WITH NEEDLE LOCALIZATION AND AXILLARY LYMPH NODE DISSECTION Right 07/29/2016   Procedure: RIGHT BREAST LUMPECTOMY WITH DOUBLE NEEDLE LOCALIZATION AND COMPLETE RIGHT AXILLARY LYMPH NODE DISSECTION;  Surgeon: Fanny Skates, MD;  Location: [facility];  Service: General;  Laterality: Right;  . BREAST SURGERY Left 2000   Biopsy  . BUNIONECTOMY Bilateral 1998   great toe fusion on right foot  .  COLONOSCOPY W/ POLYPECTOMY    . HERNIA REPAIR  2010   [place] SINUS SURGERY  2015  . PORTACATH PLACEMENT N/A 09/05/2016   Procedure: INSERTION PORT-A-CATH;  Surgeon: Fanny Skates, MD;  [facility];  Service: General;  Laterality: N/A;  . REPLACEMENT TOTAL KNEE Bilateral 2007  . TOTAL SHOULDER REPLACEMENT Left 2007    There were no vitals filed for this visit.      Subjective Assessment - 04/22/17 1412    Subjective Had my cataract surgery yesterday and that went fine, I just have the eyedrops for that. I went to [facility] this morning for my appt to get measured but Juliann Pulse ended up being out today for a fundraiser so they R/S me for May 10 to get measured. I didn't get to try the massage yet since I had my surgery yesterday.   Pertinent History Right breast lumpectomy with 16 lymph nodes removed on July 29, 2016. Had chemotherapy.  Started XRT 12/24/16 and should finish it 02/03/17.  Has had bilateral knee replacements within the past 5 years. Had left shoulder replaced. Patient has been seen here about three months ago.   Currently in Pain? No/denies  [place] [facility] Adult PT Treatment/Exercise - 04/22/17 0001      Manual Therapy   Manual Lymphatic Drainage (MLD) Reviewed with patient and had her perform the following:  five diaphragmatic breaths, short neck, left axilla and anterior interaxillary anastomosis, right groin and axillo-inguinal anastomosis, and right UE from dorsal hand to shoulder.                        [facility] Goals - 04/20/17 2112      CC Long Term Goal  #1   Title Patient will know how and where to obtain a new daytime and Velcro compression garment.   Status Achieved     CC Long Term Goal  #2   Title Patient will be independent in self-manual lymph drainage.   Status Partially Met            Plan - 04/22/17 1447    Clinical Impression Statement Continued with review of self  manual lymph drainage as done last session. Pt does fairly well with this with most VC required for correct pressure, she does well with skin stretch. She did not get to practice after Mondays session due to having cataract surgery yesterday but plans to practice before next session to bring back questions. Pt will benefit from one more visit to finalize instruction of self MLD. She hsa appt to get measured for compression garments May 10.   Rehab Potential Excellent   Clinical Impairments Affecting Rehab Potential previous total joint replacements in other joints with generalized weakness, obesity; caution of left total shoulder replacement  16 lymph nodes removed from right axilla    PT Frequency --  3 total treatment visits   PT Duration 4 weeks   PT Next Visit Plan Review and answers questions about self-manual lymph drainage.  D/C per authorization next visit.    Consulted and Agree with Plan of Care Patient      Patient will benefit from skilled therapeutic intervention in order to improve the following deficits and impairments:  Increased edema, Obesity, Impaired UE functional use  Visit Diagnosis: Lymphedema, not elsewhere classified     Problem List Patient Active Problem List   Diagnosis Date Noted  . Osteopenia 04/01/2017  . Fall 01/06/2017  . Port catheter in place 12/25/2016  . IBS (irritable bowel syndrome) 11/25/2016  . Breast cancer of lower-inner quadrant of right female breast ([place]) 06/27/2016  . OSA on CPAP 06/26/2016  . Type 2 diabetes mellitus without complication ([place]) 54/65/6812  . Severe obesity (BMI >= 40) ([place]) 09/04/2014  . Essential hypertension 09/04/2014  . HLD (hyperlipidemia) 09/04/2014  . Gastroesophageal reflux disease without esophagitis 09/04/2014  . Asthma, mild persistent 09/04/2014  . Urge incontinence 09/04/2014  . Seasonal allergies 09/04/2014  . Generalized anxiety disorder 09/04/2014    Otelia Limes, PTA 04/22/2017, 2:54  PM  [place], [place], [address] Phone: [phone]   Fax:  [phone]  Name: Tiffany Arnold MRN: [id number] Date of Birth: January 08, 1941  PHYSICAL THERAPY DISCHARGE SUMMARY  Visits from Start of Care: 3  Current functional level related to goals / functional outcomes: Unknown presently but at last visit, patient was doing well and was ready for discharge at following visit although she did not come to her last visit. See above objective information.   Remaining deficits: Unknown   Education / Equipment: Lymphedema education; compression garment recommendations Plan: Patient agrees to discharge.  Patient  goals were partially met. Patient is being discharged due to not returning since the last visit.  ?????         Annia Friendly, [place] 01/28/18 10:27 AM

## 2017-04-24 ENCOUNTER — Ambulatory Visit: Payer: Medicare PPO | Admitting: Physical Therapy

## 2017-04-27 ENCOUNTER — Ambulatory Visit: Payer: Medicare PPO | Admitting: Internal Medicine

## 2017-04-27 ENCOUNTER — Inpatient Hospital Stay (HOSPITAL_COMMUNITY)
Admission: EM | Admit: 2017-04-27 | Discharge: 2017-04-30 | DRG: 871 | Disposition: A | Discharge status: Home Health | Payer: Medicare PPO | Attending: Internal Medicine | Admitting: Internal Medicine

## 2017-04-27 ENCOUNTER — Other Ambulatory Visit: Payer: Self-pay | Admitting: Hematology

## 2017-04-27 ENCOUNTER — Emergency Department (HOSPITAL_COMMUNITY): Payer: Medicare PPO

## 2017-04-27 ENCOUNTER — Encounter (HOSPITAL_COMMUNITY): Payer: Self-pay | Admitting: Emergency Medicine

## 2017-04-27 ENCOUNTER — Telehealth: Payer: Self-pay | Admitting: Internal Medicine

## 2017-04-27 DIAGNOSIS — R4701 Aphasia: Secondary | ICD-10-CM | POA: Diagnosis present

## 2017-04-27 DIAGNOSIS — K7200 Acute and subacute hepatic failure without coma: Secondary | ICD-10-CM | POA: Diagnosis not present

## 2017-04-27 DIAGNOSIS — K589 Irritable bowel syndrome without diarrhea: Secondary | ICD-10-CM | POA: Diagnosis present

## 2017-04-27 DIAGNOSIS — Z9989 Dependence on other enabling machines and devices: Secondary | ICD-10-CM

## 2017-04-27 DIAGNOSIS — A4151 Sepsis due to Escherichia coli [E. coli]: Secondary | ICD-10-CM | POA: Diagnosis not present

## 2017-04-27 DIAGNOSIS — C50311 Malignant neoplasm of lower-inner quadrant of right female breast: Secondary | ICD-10-CM | POA: Diagnosis present

## 2017-04-27 DIAGNOSIS — X58XXXA Exposure to other specified factors, initial encounter: Secondary | ICD-10-CM | POA: Diagnosis present

## 2017-04-27 DIAGNOSIS — R6520 Severe sepsis without septic shock: Secondary | ICD-10-CM | POA: Diagnosis present

## 2017-04-27 DIAGNOSIS — G4733 Obstructive sleep apnea (adult) (pediatric): Secondary | ICD-10-CM | POA: Diagnosis not present

## 2017-04-27 DIAGNOSIS — K8000 Calculus of gallbladder with acute cholecystitis without obstruction: Secondary | ICD-10-CM | POA: Diagnosis present

## 2017-04-27 DIAGNOSIS — Z96653 Presence of artificial knee joint, bilateral: Secondary | ICD-10-CM | POA: Diagnosis present

## 2017-04-27 DIAGNOSIS — Z853 Personal history of malignant neoplasm of breast: Secondary | ICD-10-CM | POA: Diagnosis not present

## 2017-04-27 DIAGNOSIS — E162 Hypoglycemia, unspecified: Secondary | ICD-10-CM

## 2017-04-27 DIAGNOSIS — R945 Abnormal results of liver function studies: Secondary | ICD-10-CM

## 2017-04-27 DIAGNOSIS — I119 Hypertensive heart disease without heart failure: Secondary | ICD-10-CM | POA: Diagnosis present

## 2017-04-27 DIAGNOSIS — Z1611 Resistance to penicillins: Secondary | ICD-10-CM | POA: Diagnosis present

## 2017-04-27 DIAGNOSIS — E119 Type 2 diabetes mellitus without complications: Secondary | ICD-10-CM

## 2017-04-27 DIAGNOSIS — E7800 Pure hypercholesterolemia, unspecified: Secondary | ICD-10-CM | POA: Diagnosis not present

## 2017-04-27 DIAGNOSIS — Z6841 Body Mass Index (BMI) 40.0 and over, adult: Secondary | ICD-10-CM

## 2017-04-27 DIAGNOSIS — N390 Urinary tract infection, site not specified: Secondary | ICD-10-CM | POA: Diagnosis not present

## 2017-04-27 DIAGNOSIS — E1151 Type 2 diabetes mellitus with diabetic peripheral angiopathy without gangrene: Secondary | ICD-10-CM | POA: Diagnosis present

## 2017-04-27 DIAGNOSIS — S36119A Unspecified injury of liver, initial encounter: Secondary | ICD-10-CM | POA: Diagnosis present

## 2017-04-27 DIAGNOSIS — Z96612 Presence of left artificial shoulder joint: Secondary | ICD-10-CM | POA: Diagnosis present

## 2017-04-27 DIAGNOSIS — Z170 Estrogen receptor positive status [ER+]: Secondary | ICD-10-CM

## 2017-04-27 DIAGNOSIS — A419 Sepsis, unspecified organism: Secondary | ICD-10-CM | POA: Diagnosis not present

## 2017-04-27 DIAGNOSIS — Z7982 Long term (current) use of aspirin: Secondary | ICD-10-CM

## 2017-04-27 DIAGNOSIS — N179 Acute kidney failure, unspecified: Secondary | ICD-10-CM | POA: Diagnosis not present

## 2017-04-27 DIAGNOSIS — Z87891 Personal history of nicotine dependence: Secondary | ICD-10-CM

## 2017-04-27 DIAGNOSIS — N644 Mastodynia: Secondary | ICD-10-CM | POA: Diagnosis not present

## 2017-04-27 DIAGNOSIS — I10 Essential (primary) hypertension: Secondary | ICD-10-CM | POA: Diagnosis not present

## 2017-04-27 DIAGNOSIS — K8020 Calculus of gallbladder without cholecystitis without obstruction: Secondary | ICD-10-CM | POA: Diagnosis not present

## 2017-04-27 DIAGNOSIS — B9620 Unspecified Escherichia coli [E. coli] as the cause of diseases classified elsewhere: Secondary | ICD-10-CM | POA: Diagnosis present

## 2017-04-27 DIAGNOSIS — Z9109 Other allergy status, other than to drugs and biological substances: Secondary | ICD-10-CM

## 2017-04-27 DIAGNOSIS — R112 Nausea with vomiting, unspecified: Secondary | ICD-10-CM | POA: Diagnosis not present

## 2017-04-27 DIAGNOSIS — G9341 Metabolic encephalopathy: Secondary | ICD-10-CM

## 2017-04-27 DIAGNOSIS — Z923 Personal history of irradiation: Secondary | ICD-10-CM

## 2017-04-27 DIAGNOSIS — F329 Major depressive disorder, single episode, unspecified: Secondary | ICD-10-CM | POA: Diagnosis present

## 2017-04-27 DIAGNOSIS — K819 Cholecystitis, unspecified: Secondary | ICD-10-CM

## 2017-04-27 DIAGNOSIS — Z8249 Family history of ischemic heart disease and other diseases of the circulatory system: Secondary | ICD-10-CM

## 2017-04-27 DIAGNOSIS — Z86010 Personal history of colonic polyps: Secondary | ICD-10-CM

## 2017-04-27 DIAGNOSIS — E11649 Type 2 diabetes mellitus with hypoglycemia without coma: Secondary | ICD-10-CM | POA: Diagnosis present

## 2017-04-27 DIAGNOSIS — B9689 Other specified bacterial agents as the cause of diseases classified elsewhere: Secondary | ICD-10-CM | POA: Diagnosis present

## 2017-04-27 DIAGNOSIS — D696 Thrombocytopenia, unspecified: Secondary | ICD-10-CM | POA: Diagnosis not present

## 2017-04-27 DIAGNOSIS — R7989 Other specified abnormal findings of blood chemistry: Secondary | ICD-10-CM | POA: Diagnosis present

## 2017-04-27 DIAGNOSIS — E860 Dehydration: Secondary | ICD-10-CM | POA: Diagnosis present

## 2017-04-27 DIAGNOSIS — Z9221 Personal history of antineoplastic chemotherapy: Secondary | ICD-10-CM

## 2017-04-27 DIAGNOSIS — Z7983 Long term (current) use of bisphosphonates: Secondary | ICD-10-CM

## 2017-04-27 DIAGNOSIS — Z7984 Long term (current) use of oral hypoglycemic drugs: Secondary | ICD-10-CM

## 2017-04-27 DIAGNOSIS — Z79899 Other long term (current) drug therapy: Secondary | ICD-10-CM

## 2017-04-27 DIAGNOSIS — E11641 Type 2 diabetes mellitus with hypoglycemia with coma: Secondary | ICD-10-CM | POA: Diagnosis not present

## 2017-04-27 DIAGNOSIS — Z6838 Body mass index (BMI) 38.0-38.9, adult: Secondary | ICD-10-CM

## 2017-04-27 DIAGNOSIS — E785 Hyperlipidemia, unspecified: Secondary | ICD-10-CM | POA: Diagnosis present

## 2017-04-27 LAB — COMPREHENSIVE METABOLIC PANEL
ALT: 625 U/L — ABNORMAL HIGH (ref 14–54)
ANION GAP: 10 (ref 5–15)
AST: 316 U/L — ABNORMAL HIGH (ref 15–41)
Albumin: 3.2 g/dL — ABNORMAL LOW (ref 3.5–5.0)
Alkaline Phosphatase: 258 U/L — ABNORMAL HIGH (ref 38–126)
BILIRUBIN TOTAL: 3.8 mg/dL — AB (ref 0.3–1.2)
BUN: 62 mg/dL — ABNORMAL HIGH (ref 6–20)
CO2: 23 mmol/L (ref 22–32)
Calcium: 9.4 mg/dL (ref 8.9–10.3)
Chloride: 103 mmol/L (ref 101–111)
Creatinine, Ser: 1.54 mg/dL — ABNORMAL HIGH (ref 0.44–1.00)
GFR, EST AFRICAN AMERICAN: 37 mL/min — AB (ref >60)
GFR, EST NON AFRICAN AMERICAN: 32 mL/min — AB (ref >60)
GLUCOSE: 54 mg/dL — AB (ref 65–99)
POTASSIUM: 4.1 mmol/L (ref 3.5–5.1)
Sodium: 136 mmol/L (ref 135–145)
TOTAL PROTEIN: 6.3 g/dL — AB (ref 6.5–8.1)

## 2017-04-27 LAB — AMMONIA: AMMONIA: 29 umol/L (ref 9–35)

## 2017-04-27 LAB — ACETAMINOPHEN LEVEL
ACETAMINOPHEN (TYLENOL), SERUM: 24 ug/mL (ref 10–30)
Acetaminophen (Tylenol), Serum: 14 ug/mL (ref 10–30)

## 2017-04-27 LAB — URINALYSIS, ROUTINE W REFLEX MICROSCOPIC
BILIRUBIN URINE: NEGATIVE
GLUCOSE, UA: NEGATIVE mg/dL
KETONES UR: NEGATIVE mg/dL
LEUKOCYTES UA: NEGATIVE
NITRITE: NEGATIVE
PH: 5 (ref 5.0–8.0)
Protein, ur: 30 mg/dL — AB
SPECIFIC GRAVITY, URINE: 1.02 (ref 1.005–1.030)

## 2017-04-27 LAB — CBC WITH DIFFERENTIAL/PLATELET
BASOS ABS: 0 10*3/uL (ref 0.0–0.1)
Basophils Relative: 0 %
Eosinophils Absolute: 0 10*3/uL (ref 0.0–0.7)
Eosinophils Relative: 0 %
HCT: 33.5 % — ABNORMAL LOW (ref 36.0–46.0)
HEMOGLOBIN: 11.1 g/dL — AB (ref 12.0–15.0)
LYMPHS PCT: 2 %
Lymphs Abs: 0.3 10*3/uL — ABNORMAL LOW (ref 0.7–4.0)
MCH: 28.3 pg (ref 26.0–34.0)
MCHC: 33.1 g/dL (ref 30.0–36.0)
MCV: 85.5 fL (ref 78.0–100.0)
MONO ABS: 0.1 10*3/uL (ref 0.1–1.0)
Monocytes Relative: 1 %
NEUTROS PCT: 97 %
Neutro Abs: 13.7 10*3/uL — ABNORMAL HIGH (ref 1.7–7.7)
PLATELETS: 127 10*3/uL — AB (ref 150–400)
RBC: 3.92 MIL/uL (ref 3.87–5.11)
RDW: 15.4 % (ref 11.5–15.5)
WBC: 14.1 10*3/uL — AB (ref 4.0–10.5)

## 2017-04-27 LAB — BLOOD GAS, VENOUS
ACID-BASE EXCESS: 1.8 mmol/L (ref 0.0–2.0)
Bicarbonate: 25.4 mmol/L (ref 20.0–28.0)
O2 SAT: 85.7 %
PCO2 VEN: 37.9 mmHg — AB (ref 44.0–60.0)
PO2 VEN: 49.6 mmHg — AB (ref 32.0–45.0)
Patient temperature: 98.6
pH, Ven: 7.441 — ABNORMAL HIGH (ref 7.250–7.430)

## 2017-04-27 LAB — GLUCOSE, CAPILLARY
GLUCOSE-CAPILLARY: 47 mg/dL — AB (ref 65–99)
Glucose-Capillary: 55 mg/dL — ABNORMAL LOW (ref 65–99)
Glucose-Capillary: 101 mg/dL — ABNORMAL HIGH (ref 65–99)

## 2017-04-27 LAB — DIC (DISSEMINATED INTRAVASCULAR COAGULATION) PANEL
APTT: 42 s — AB (ref 24–36)
D DIMER QUANT: 14.78 ug{FEU}/mL — AB (ref 0.00–0.50)
FIBRINOGEN: 692 mg/dL — AB (ref 210–475)
PROTHROMBIN TIME: 17.9 s — AB (ref 11.4–15.2)

## 2017-04-27 LAB — DIC (DISSEMINATED INTRAVASCULAR COAGULATION)PANEL
INR: 1.46
Platelets: 110 10*3/uL — ABNORMAL LOW (ref 150–400)
Smear Review: NONE SEEN

## 2017-04-27 LAB — CBG MONITORING, ED: GLUCOSE-CAPILLARY: 101 mg/dL — AB (ref 65–99)

## 2017-04-27 LAB — PROTIME-INR
INR: 1.59
PROTHROMBIN TIME: 19.2 s — AB (ref 11.4–15.2)

## 2017-04-27 LAB — VITAMIN B12: Vitamin B-12: 1443 pg/mL — ABNORMAL HIGH (ref 180–914)

## 2017-04-27 LAB — TSH: TSH: 0.656 u[IU]/mL (ref 0.350–4.500)

## 2017-04-27 LAB — CK: Total CK: 42 U/L (ref 38–234)

## 2017-04-27 LAB — I-STAT CG4 LACTIC ACID, ED
LACTIC ACID, VENOUS: 1.92 mmol/L — AB (ref 0.5–1.9)
Lactic Acid, Venous: 1.62 mmol/L (ref 0.5–1.9)

## 2017-04-27 MED ORDER — DEXTROSE 5 % IV SOLN
2.0000 g | INTRAVENOUS | Status: DC
  Administered 2017-04-27 – 2017-04-29 (×3): 2 g via INTRAVENOUS
  Filled 2017-04-27 (×4): qty 2

## 2017-04-27 MED ORDER — LACTATED RINGERS IV BOLUS (SEPSIS)
2000.0000 mL | Freq: Once | INTRAVENOUS | Status: AC
  Administered 2017-04-27: 2000 mL via INTRAVENOUS

## 2017-04-27 MED ORDER — IPRATROPIUM-ALBUTEROL 18-103 MCG/ACT IN AERO: 2.0000 | INHALATION_SPRAY | RESPIRATORY_TRACT | Status: DC | PRN

## 2017-04-27 MED ORDER — ONDANSETRON HCL 4 MG PO TABS
4.0000 mg | ORAL_TABLET | Freq: Four times a day (QID) | ORAL | Status: DC | PRN
  Filled 2017-04-27: qty 1

## 2017-04-27 MED ORDER — SERTRALINE HCL 25 MG PO TABS
25.0000 mg | ORAL_TABLET | Freq: Every day | ORAL | Status: DC
  Administered 2017-04-27 – 2017-04-29 (×3): 25 mg via ORAL
  Filled 2017-04-27 (×3): qty 1

## 2017-04-27 MED ORDER — ALPRAZOLAM 0.25 MG PO TABS
0.2500 mg | ORAL_TABLET | Freq: Two times a day (BID) | ORAL | Status: DC | PRN
  Administered 2017-04-28 – 2017-04-29 (×2): 0.25 mg via ORAL
  Filled 2017-04-27 (×3): qty 1

## 2017-04-27 MED ORDER — HEPARIN SODIUM (PORCINE) 5000 UNIT/ML IJ SOLN
5000.0000 [IU] | Freq: Three times a day (TID) | INTRAMUSCULAR | Status: DC
  Administered 2017-04-27 – 2017-04-28 (×2): 5000 [IU] via SUBCUTANEOUS
  Filled 2017-04-27 (×2): qty 1

## 2017-04-27 MED ORDER — DEXTROSE-NACL 5-0.45 % IV SOLN
INTRAVENOUS | Status: DC
  Administered 2017-04-27: 21:00:00 via INTRAVENOUS

## 2017-04-27 MED ORDER — DIPHENOXYLATE-ATROPINE 2.5-0.025 MG PO TABS: 2.0000 | ORAL_TABLET | Freq: Four times a day (QID) | ORAL | Status: DC | PRN

## 2017-04-27 MED ORDER — ONDANSETRON HCL 4 MG/2ML IJ SOLN: 4.0000 mg | Freq: Four times a day (QID) | INTRAMUSCULAR | Status: DC | PRN

## 2017-04-27 MED ORDER — PRAVASTATIN SODIUM 20 MG PO TABS: 40.0000 mg | ORAL_TABLET | Freq: Every day | ORAL | Status: DC

## 2017-04-27 MED ORDER — DEXTROSE 50 % IV SOLN
1.0000 | Freq: Once | INTRAVENOUS | Status: AC
  Administered 2017-04-27: 50 mL via INTRAVENOUS
  Filled 2017-04-27: qty 50

## 2017-04-27 MED ORDER — INSULIN ASPART 100 UNIT/ML ~~LOC~~ SOLN
0.0000 [IU] | Freq: Three times a day (TID) | SUBCUTANEOUS | Status: DC
  Administered 2017-04-28 – 2017-04-30 (×4): 2 [IU] via SUBCUTANEOUS

## 2017-04-27 MED ORDER — IPRATROPIUM-ALBUTEROL 0.5-2.5 (3) MG/3ML IN SOLN: 3.0000 mL | RESPIRATORY_TRACT | Status: DC | PRN

## 2017-04-27 MED ORDER — ASPIRIN 81 MG PO CHEW
81.0000 mg | CHEWABLE_TABLET | Freq: Every day | ORAL | Status: DC
  Administered 2017-04-27 – 2017-04-30 (×4): 81 mg via ORAL
  Filled 2017-04-27 (×4): qty 1

## 2017-04-27 MED ORDER — SODIUM CHLORIDE 0.9 % IV SOLN
INTRAVENOUS | Status: DC
  Administered 2017-04-27: 1000 mL via INTRAVENOUS

## 2017-04-27 MED ORDER — OXYBUTYNIN CHLORIDE ER 5 MG PO TB24
10.0000 mg | ORAL_TABLET | Freq: Every day | ORAL | Status: DC
  Administered 2017-04-27 – 2017-04-29 (×3): 10 mg via ORAL
  Filled 2017-04-27 (×3): qty 2

## 2017-04-27 NOTE — ED Notes (Signed)
Going to MRI.

## 2017-04-27 NOTE — ED Notes (Signed)
Gave report to Elisa, RN for room 1336.

## 2017-04-27 NOTE — Telephone Encounter (Signed)
Noted, agree with advice given 

## 2017-04-27 NOTE — ED Notes (Signed)
Hospitalist is at bedside speaking with patient and pt's daughter. Blood work will be delayed till ultrasound is completed.

## 2017-04-27 NOTE — ED Notes (Signed)
Delay in lab draw due to patient in MRI @ this time.

## 2017-04-27 NOTE — Telephone Encounter (Signed)
Patient Name: Tiffany Arnold DOB: [birth year] Initial Comment Caller states her sister who has been through cancer surgery, chemo therapy, and radiation and is now on her last stage of treatment. She has been having some pain, vomiting, diarrhea, for the passed few days. Her speech is off, having trouble forming sentences and getting thoughts into words. Nurse Assessment Nurse: Ronnald Ramp, RN, Miranda Date/Time (Eastern Time): 04/27/2017 9:34:38 AM Confirm and document reason for call. If symptomatic, describe symptoms. ---Caller states her sister is going through cancer treatment. She has been having vomiting and diarrhea for the last few days. She is having trouble communicating but speech is not slurred and not confused. She is having trouble with the worse she is saying is not what she means to say. Speech issues started last night. Does the patient have any new or worsening symptoms? ---Yes Will a triage be completed? ---Yes Related visit to physician within the last 2 weeks? ---N/A Does the PT have any chronic conditions? (i.e. diabetes, asthma, etc.) ---Yes List chronic conditions. ---Breast cancer Is this a behavioral health or substance abuse call? ---No Guidelines Guideline Title Affirmed Question Affirmed Notes Neurologic Deficit [1] Loss of speech or garbled speech AND [2] gradual onset (e.g., days to weeks) AND [3] present now Final Disposition User See Physician within 4 Hours (or PCP triage) Ronnald Ramp, RN, Miranda Comments Following triage and while making appt. RN could hear the pt in the background trying to speak. She is unable to form word correctly. Upgraded to go to ED now. Referrals [facility] [place] - ED Disagree/Comply: Comply

## 2017-04-27 NOTE — ED Notes (Signed)
Ultrasound at bedside. Pt has returned from MRI.

## 2017-04-27 NOTE — H&P (Signed)
History and Physical    Tiffany Arnold DTO:[id number] DOB: 01/28/1941 DOA: 04/27/2017  I have briefly reviewed the patient's prior medical records in [facility]  PCP: [name], NP  Patient coming from: Home  Chief Complaint: Acute encephalopathy  HPI: Tiffany Arnold is a 76 y.o. female with medical history significant of breast cancer diagnosed in 2017 status post chemotherapy and radiation therapy, finished XRT in February 2017, hypertension, hyperlipidemia, sleep apnea, is being brought to the emergency room by patient's sister due to altered mental status.  Patient's sister tells me that over the last 1-2 days patient has been having difficulties getting the words out.  She called her sister last night, and over the phone she felt that something was different, felt like the patient could not get the words out.  She is usually alert and oriented 4, and pretty sharp.  Her sister also noted when the patient got surgery for her breast cancer she had mild confusion following general anesthesia.  She also reports that over the last week she has been having nausea, which is unusual for her, vomiting every other day, as well as intermittent diarrhea.  She has a history of IBS, and diarrhea is not that unusual for her.  She is also been complaining of pain beneath her right breast.  There are no reported fever or chills, no cough/chest congestion, no chest pains or shortness of breath.  ED Course: In the emergency room patient is afebrile, heart rate is normal, blood pressures in the 90s initially on repeat it was 103/61, and she is satting well on room air.  Blood work is significant for creatinine elevation to 1.5, lactic acid is 1.9, she has an elevation in her alkaline phosphatase, AST and ALT are elevated as well and her bilirubin is 3.8.  She has mild leukocytosis with a white count of 14 and stromal cytopenia with platelets of 127.  Review of Systems: As per HPI otherwise 10 point  review of systems negative.   Past Medical History:  Diagnosis Date  . Allergy   . Arthritis   . Asthma   . Cancer ([place]) 07/29/2016   right breast  . Cataract of both eyes   . Chicken pox   . Colon polyps   . Dog bite of index finger 07/23/2016   Left index finger  . GERD (gastroesophageal reflux disease)   . History of radiation therapy 12/24/16- 02/03/17   Right Breast 50.4 Gy in 28 fractions, Right Axilla 45 Gy in 25 fractions.  . Hyperlipidemia   . Hypertension   . IBS (irritable bowel syndrome)   . Phlebitis   . Pneumonia    hx of several yrs ago  . Shortness of breath dyspnea    with exertion only  . Sleep apnea    wears CPAP machine nightly    Past Surgical History:  Procedure Laterality Date  . BACK SURGERY     lower  . BREAST CYST EXCISION Right 2001   negative  . BREAST LUMPECTOMY WITH NEEDLE LOCALIZATION AND AXILLARY LYMPH NODE DISSECTION Right 07/29/2016   Procedure: RIGHT BREAST LUMPECTOMY WITH DOUBLE NEEDLE LOCALIZATION AND COMPLETE RIGHT AXILLARY LYMPH NODE DISSECTION;  Surgeon: Fanny Skates, MD;  Location: [facility];  Service: General;  Laterality: Right;  . BREAST SURGERY Left 2000   Biopsy  . BUNIONECTOMY Bilateral 1998   great toe fusion on right foot  . COLONOSCOPY W/ POLYPECTOMY    . HERNIA REPAIR  2015  [facility] SINUS SURGERY  2015  . PORTACATH PLACEMENT N/A 09/05/2016   Procedure: INSERTION PORT-A-CATH;  Surgeon: Fanny Skates, MD;  Location: [facility];  Service: General;  Laterality: N/A;  . REPLACEMENT TOTAL KNEE Bilateral 2007  . TOTAL SHOULDER REPLACEMENT Left 2007     reports that she quit smoking about 26 years ago. She has a 50.00 pack-year smoking history. She has never used smokeless tobacco. She reports that she drinks alcohol. She reports that she does not use drugs.  Allergies  Allergen Reactions  . Other Swelling and Shortness Of Breath    Cats, dogs, mold Induced asthma Cats, dogs, mold    Family History  Problem  Relation Age of Onset  . Arthritis Mother   . Stroke Mother   . Hypertension Mother   . Cancer Father     Prostate  . Stroke Father   . Hypertension Father   . Hypertension Maternal Grandmother   . Rheum arthritis Maternal Grandfather   . Stroke Maternal Grandfather   . Hypertension Maternal Grandfather   . Cancer Paternal Grandmother     Colon  . Hypertension Paternal Grandmother   . Hypertension Paternal Grandfather   . Breast cancer Neg Hx     Prior to Admission medications   Medication Sig Start Date End Date Taking? Authorizing Provider  albuterol-ipratropium (COMBIVENT) 18-103 MCG/ACT inhaler Inhale 2 puffs into the lungs every 4 (four) hours as needed for wheezing or shortness of breath.    Yes [provider]  alendronate (FOSAMAX) 70 MG tablet Take 1 tablet (70 mg total) by mouth once a week. Take with a full glass of water on an empty stomach. 04/01/17  Yes Truitt Merle, MD  ALPRAZolam Duanne Moron) 0.25 MG tablet TAKE ONE TABLET BY MOUTH TWICE DAILY AS NEEDED 11/26/16  Yes [name], NP  aspirin 81 MG tablet Take 81 mg by mouth daily.   Yes [provider]  Calcium Carbonate-Vitamin D (CALCIUM-VITAMIN D) 500-200 MG-UNIT tablet Take 1 tablet by mouth daily.   Yes [provider]  cetirizine (ZYRTEC) 10 MG tablet Take 10 mg by mouth daily.   Yes [provider]  Cholecalciferol (VITAMIN D3) 1000 units CAPS Take by mouth 2 (two) times daily.   Yes [provider]  diphenoxylate-atropine (LOMOTIL) 2.5-0.025 MG tablet Take 2 tablets by mouth 4 (four) times daily as needed for diarrhea or loose stools. 11/25/16  Yes Susanne Borders, NP  EPINEPHrine 0.3 mg/0.3 mL IJ SOAJ injection Inject 0.3 mg into the muscle once as needed (ALLERGIC REACTION).    Yes [provider]  glipiZIDE (GLUCOTROL) 5 MG tablet TAKE ONE TABLET TWICE DAILY BEFORE A MEAL 04/06/17  Yes Baity, Coralie Keens, NP  glucosamine-chondroitin 500-400 MG tablet Take 1 tablet  by mouth 2 (two) times daily.   Yes [provider]  letrozole (FEMARA) 2.5 MG tablet Take 1 tablet (2.5 mg total) by mouth daily. 04/01/17  Yes Truitt Merle, MD  lidocaine-prilocaine (EMLA) cream Apply to port at least 1 hour prior to chemo 09/04/16  Yes Truitt Merle, MD  Olmesartan-Amlodipine-HCTZ 40-5-25 MG TABS Take 1 tablet by mouth daily. 12/05/16  Yes Baity, Coralie Keens, NP  ondansetron (ZOFRAN) 8 MG tablet TAKE ONE TABLET BY MOUTH TWICE DAILY AS NEEDED FOR  REFRACTORY  NAUSEA/VOMITING.  START  ON  DAY  3  AFTER  CHEMO. 10/16/16  Yes Truitt Merle, MD  oxybutynin (DITROPAN-XL) 10 MG 24 hr tablet Take 1 tablet (10 mg  total) by mouth at bedtime. 12/05/16  Yes Baity, Coralie Keens, NP  potassium chloride (K-DUR) 10 MEQ tablet Take 1 tablet (10 mEq total) by mouth 2 (two) times daily. 03/06/17  Yes [name], NP  pravastatin (PRAVACHOL) 40 MG tablet Take 1 tablet (40 mg total) by mouth daily. 12/05/16  Yes [name], NP  prochlorperazine (COMPAZINE) 10 MG tablet TAKE ONE TABLET BY MOUTH EVERY 6 HOURS AS NEEDED FOR NAUSEA AND VOMITING 10/16/16  Yes Truitt Merle, MD  ranitidine (ZANTAC) 150 MG tablet Take 150 mg by mouth every evening.    Yes [provider]  sertraline (ZOLOFT) 25 MG tablet TAKE ONE (1) TABLET EACH DAY 02/26/17  Yes Baity, Coralie Keens, NP  vitamin B-12 (CYANOCOBALAMIN) 1000 MCG tablet Take 1,000 mcg by mouth daily.   Yes [provider]  magic mouthwash SOLN Swish and Spit 5-10 mLs 4 times a day as needed. Patient not taking: Reported on 04/01/2017 09/16/16   Owens Shark, NP    Physical Exam: Vitals:   04/27/17 1137 04/27/17 1243 04/27/17 1300 04/27/17 1353  BP: (!) 92/52 (!) 87/54 (!) 96/59 103/61  Pulse: 85 75 74 70  Resp: 16 18 18 18   Temp: 98.2 F (36.8 C) 97.6 F (36.4 C)    TempSrc: Oral Oral    SpO2: 96% 90% 95% 95%  Weight: 89.8 kg (198 lb)     Height: 5' (1.524 m)      Constitutional: NAD, calm, comfortable Eyes: PERRL ENMT: Mucous membranes are  moist.  Neck: normal, supple, no masses, no thyromegaly Respiratory: clear to auscultation bilaterally, no wheezing, no crackles. Normal respiratory effort. No accessory muscle use.  Cardiovascular: Regular rate and rhythm, no murmurs / rubs / gallops. No extremity edema. 2+ pedal pulses.  Abdomen: no tenderness, no masses palpated. Bowel sounds positive.  Musculoskeletal: no clubbing / cyanosis. Normal muscle tone.  Skin: no rashes, lesions, ulcers. No induration Neurologic: CN 2-12 grossly intact. Strength 5/5 in all 4. Slight word finding difficulty Psychiatric: Normal judgment and insight. Alert and oriented x 3. Normal mood.   Labs on Admission: I have personally reviewed following labs and imaging studies  CBC:  Recent Labs Lab 04/27/17 1153  WBC 14.1*  NEUTROABS 13.7*  HGB 11.1*  HCT 33.5*  MCV 85.5  PLT 914*   Basic Metabolic Panel:  Recent Labs Lab 04/27/17 1153  NA 136  K 4.1  CL 103  CO2 23  GLUCOSE 54*  BUN 62*  CREATININE 1.54*  CALCIUM 9.4   GFR: Estimated Creatinine Clearance: 32.5 mL/min (A) (by C-G formula based on SCr of 1.54 mg/dL (H)). Liver Function Tests:  Recent Labs Lab 04/27/17 1153  AST 316*  ALT 625*  ALKPHOS 258*  BILITOT 3.8*  PROT 6.3*  ALBUMIN 3.2*   No results for input(s): LIPASE, AMYLASE in the last 168 hours. No results for input(s): AMMONIA in the last 168 hours. Coagulation Profile:  Recent Labs Lab 04/27/17 1153  INR 1.59   Cardiac Enzymes: No results for input(s): CKTOTAL, CKMB, CKMBINDEX, TROPONINI in the last 168 hours. BNP (last 3 results) No results for input(s): PROBNP in the last 8760 hours. HbA1C: No results for input(s): HGBA1C in the last 72 hours. CBG: No results for input(s): GLUCAP in the last 168 hours. Lipid Profile: No results for input(s): CHOL, HDL, LDLCALC, TRIG, CHOLHDL, LDLDIRECT in the last 72 hours. Thyroid Function Tests: No results for input(s): TSH, T4TOTAL, FREET4, T3FREE,  THYROIDAB in the last  72 hours. Anemia Panel: No results for input(s): VITAMINB12, FOLATE, FERRITIN, TIBC, IRON, RETICCTPCT in the last 72 hours. Urine analysis:    Component Value Date/Time   COLORURINE Yellow 05/15/2012 1657   APPEARANCEUR Turbid 05/15/2012 1657   LABSPEC 1.023 05/15/2012 1657   PHURINE 5.0 05/15/2012 1657   GLUCOSEU Negative 05/15/2012 1657   HGBUR 3+ 05/15/2012 1657   BILIRUBINUR Negative 05/15/2012 1657   KETONESUR Negative 05/15/2012 1657   PROTEINUR 100 mg/dL 05/15/2012 1657   NITRITE Negative 05/15/2012 1657   LEUKOCYTESUR 2+ 05/15/2012 1657     Radiological Exams on Admission: Dg Chest 2 View  Result Date: 04/27/2017 CLINICAL DATA:  Pt receiving hormone replacement therapy breast cancer. Pt verbalizes right breast pain and n/v/d. EXAM: CHEST  2 VIEW COMPARISON:  None. FINDINGS: There is bilateral mild interstitial thickening. There is no focal parenchymal opacity. There is no pleural effusion or pneumothorax. There is stable cardiomegaly. 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 There are surgical clips in the right axilla from prior axillary dissection. IMPRESSION: 1. Cardiomegaly with mild pulmonary vascular congestion. Electronically Signed   By: Kathreen Devoid   On: 04/27/2017 12:17   Ct Head Wo Contrast  Result Date: 04/27/2017 CLINICAL DATA:  Aphasia EXAM: CT HEAD WITHOUT CONTRAST TECHNIQUE: Contiguous axial images were obtained from the base of the skull through the vertex without intravenous contrast. COMPARISON:  None. FINDINGS: Brain: No acute intracranial abnormality. Specifically, no hemorrhage, hydrocephalus, mass lesion, acute infarction, or significant intracranial injury. Patchy scattered small vessel disease throughout the deep white matter. Vascular: No hyperdense vessel or unexpected calcification. Skull: No acute calvarial abnormality. Sinuses/Orbits: Visualized paranasal sinuses and mastoids clear. Orbital soft tissues unremarkable.  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 No acute intracranial abnormality. Electronically Signed   By: Rolm Baptise M.D.   On: 04/27/2017 12:17    Assessment/Plan Active Problems:   Severe obesity (BMI >= 40) (HCC)   Essential hypertension   HLD (hyperlipidemia)   Type 2 diabetes mellitus without complication (HCC)   OSA on CPAP   Breast cancer of lower-inner quadrant of right female breast (HCC)   IBS (irritable bowel syndrome)   AKI (acute kidney injury) ([place])   Acute metabolic encephalopathy   Acute encephalopathy with word finding difficulties -unclear etiology, MRI brain negative for CVA. Her clinical picture does not suggest hepatic encephalopathy since she is not lethargic or confused. Ammonia normal  -check B1, 12, TSH -tylenol level 24, patient states that she took Tylenol for pain but cannot quantify how many pills. Will repeat a Tylenol level later tonight  Acute liver injury -check hepatitis panel -RUQ with dopplers with patent portal venous system with normal direction of flow and patent hepatic veins. -gallbladder with large solitary gallstone, no pericholecystic fluid or sonographic Murphy's sign. Given elevated LFTs and clinical picture will obtain a HIDA. Given Leukocytosis, will favor starting Ceftriaxone empirically until this is ruled out.  Acute kidney injury -likely due to poor po intake, gentle hydration overnight and recheck in am  Type 2 diabetes mellitus -SSI, hold home oral agents  Breast cancer -d/w case with Dr. Burr Medico, she will see patient in consultation. Unlikely related to chemotherapy  Thrombocytopenia -new, ?related to liver injury   DVT prophylaxis: SCD  Code Status: Full code  Family Communication: daughter bedside Disposition Plan: admit to medsurg Consults called: Oncology     Admission status: Inpatient    At the time of  admission, it appears that the  appropriate admission status for this patient is INPATIENT. This is judged to be reasonable and necessary in order to provide the required high service intensity to ensure the patient's safety given the presenting symptoms, physical exam findings, and initial radiographic and laboratory data in the context of their chronic comorbidities. Current circumstances are liver injury, encephalopathy, renal failure, thrombocytopenia, and it is felt to place patient at high risk for further clinical deterioration threatening life, limb, or organ. Moreover, it is my clinical judgment that the patient will require inpatient hospital care spanning beyond 2 midnights from the point of admission and that early discharge would result in unnecessary risk of decompensation and readmission or threat to life, limb or bodily function.  Marzetta Board, MD [facility] Pager [phone]  If 7PM-7AM, please contact night-coverage [email or web address] Password [email or web address]  04/27/2017, 2:36 PM

## 2017-04-27 NOTE — ED Notes (Signed)
Ultrasound at bedside

## 2017-04-27 NOTE — Progress Notes (Addendum)
Tiffany Arnold   DOB:10-14-1941   TG#:[id number]   SKA#:[id number]  ONCOLOGY FOLLOW UP  Subjective: Pt is well-known to me, has been under my care for her breast cancer. She was admitted today for acute mental status change and right abdominal pain. She still has some difficulty in wording and thinking. No fever or chills.    Objective:  Vitals:   04/27/17 1749 04/27/17 2133  BP: 105/71 94/62  Pulse: 65 77  Resp: 18 18  Temp: 97.8 F (36.6 C) 97.9 F (36.6 C)    Body mass index is 38.67 kg/m.  Intake/Output Summary (Last 24 hours) at 04/27/17 2221 Last data filed at 04/27/17 2219  Gross per 24 hour  Intake                0 ml  Output                2 ml  Net               -2 ml     Sclerae unicteric  Oropharynx clear  No peripheral adenopathy  Lungs clear -- no rales or rhonchi  Heart regular rate and rhythm  Abdomen benign  MSK no focal spinal tenderness, no peripheral edema  Neuro nonfocal   CBG (last 3)   Recent Labs  04/27/17 1956 04/27/17 2027 04/27/17 2130  GLUCAP 47* 55* 101*     Labs:  Lab Results  Component Value Date   WBC 14.1 (H) 04/27/2017   HGB 11.1 (L) 04/27/2017   HCT 33.5 (L) 04/27/2017   MCV 85.5 04/27/2017   PLT 110 (L) 04/27/2017   NEUTROABS 13.7 (H) 04/27/2017   CMP Latest Ref Rng & Units 04/27/2017 04/01/2017 03/05/2017  Glucose 65 - 99 mg/dL 54(L) 62(L) 47(LL)  BUN 6 - 20 mg/dL 62(H) 18.6 25(H)  Creatinine 0.44 - 1.00 mg/dL 1.54(H) 0.7 0.78  Sodium 135 - 145 mmol/L 136 141 137  Potassium 3.5 - 5.1 mmol/L 4.1 3.7 3.1(L)  Chloride 101 - 111 mmol/L 103 - 102  CO2 22 - 32 mmol/L 23 25 31   Calcium 8.9 - 10.3 mg/dL 9.4 10.4 10.4  Total Protein 6.5 - 8.1 g/dL 6.3(L) 6.1(L) 6.6  Total Bilirubin 0.3 - 1.2 mg/dL 3.8(H) 0.33 0.4  Alkaline Phos 38 - 126 U/L 258(H) 65 52  AST 15 - 41 U/L 316(H) 19 21  ALT 14 - 54 U/L 625(H) 17 15     Urine Studies No results for input(s): UHGB, CRYS in the last 72 hours.  Invalid input(s): UACOL,  UAPR, USPG, UPH, UTP, UGL, UKET, UBIL, UNIT, UROB, ULEU, UEPI, UWBC, URBC, UBAC, CAST, UCOM, BILUA  Basic Metabolic Panel:  Recent Labs Lab 04/27/17 1153  NA 136  K 4.1  CL 103  CO2 23  GLUCOSE 54*  BUN 62*  CREATININE 1.54*  CALCIUM 9.4   GFR Estimated Creatinine Clearance: 32.5 mL/min (A) (by C-G formula based on SCr of 1.54 mg/dL (H)). Liver Function Tests:  Recent Labs Lab 04/27/17 1153  AST 316*  ALT 625*  ALKPHOS 258*  BILITOT 3.8*  PROT 6.3*  ALBUMIN 3.2*   No results for input(s): LIPASE, AMYLASE in the last 168 hours.  Recent Labs Lab 04/27/17 1628  AMMONIA 29   Coagulation profile  Recent Labs Lab 04/27/17 1153 04/27/17 1626  INR 1.59 1.46    CBC:  Recent Labs Lab 04/27/17 1153 04/27/17 1626  WBC 14.1*  --   NEUTROABS 13.7*  --  HGB 11.1*  --   HCT 33.5*  --   MCV 85.5  --   PLT 127* 110*   Cardiac Enzymes:  Recent Labs Lab 04/27/17 1322  CKTOTAL 42   BNP: Invalid input(s): POCBNP CBG:  Recent Labs Lab 04/27/17 1526 04/27/17 1956 04/27/17 2027 04/27/17 2130  GLUCAP 101* 47* 55* 101*   D-Dimer  Recent Labs  04/27/17 1626  DDIMER 14.78*   Hgb A1c No results for input(s): HGBA1C in the last 72 hours. Lipid Profile No results for input(s): CHOL, HDL, LDLCALC, TRIG, CHOLHDL, LDLDIRECT in the last 72 hours. Thyroid function studies  Recent Labs  04/27/17 1823  TSH 0.656   Anemia work up  Recent Labs  04/27/17 [place] 1,443*   Microbiology No results found for this or any previous visit (from the past 240 hour(s)).    Studies:  Dg Chest 2 View  Result Date: 04/27/2017 CLINICAL DATA:  Pt receiving hormone replacement therapy breast cancer. Pt verbalizes right breast pain and n/v/d. EXAM: CHEST  2 VIEW COMPARISON:  None. FINDINGS: There is bilateral mild interstitial thickening. There is no focal parenchymal opacity. There is no pleural effusion or pneumothorax. There is stable cardiomegaly. There  is a total reverse left shoulder arthroplasty. There are surgical clips in the right axilla from prior axillary dissection. IMPRESSION: 1. Cardiomegaly with mild pulmonary vascular congestion. Electronically Signed   By: Kathreen Devoid   On: 04/27/2017 12:17   Ct Head Wo Contrast  Result Date: 04/27/2017 CLINICAL DATA:  Aphasia EXAM: CT HEAD WITHOUT CONTRAST TECHNIQUE: Contiguous axial images were obtained from the base of the skull through the vertex without intravenous contrast. COMPARISON:  None. FINDINGS: Brain: No acute intracranial abnormality. Specifically, no hemorrhage, hydrocephalus, mass lesion, acute infarction, or significant intracranial injury. Patchy scattered small vessel disease throughout the deep white matter. Vascular: No hyperdense vessel or unexpected calcification. Skull: No acute calvarial abnormality. Sinuses/Orbits: Visualized paranasal sinuses and mastoids clear. Orbital soft tissues unremarkable. 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 No acute intracranial abnormality. Electronically Signed   By: Rolm Baptise M.D.   On: 04/27/2017 12:17   Mr Brain Wo Contrast  Result Date: 04/27/2017 CLINICAL DATA:  Aphasia.  History of breast cancer. EXAM: MRI HEAD WITHOUT CONTRAST TECHNIQUE: Multiplanar, multiecho pulse sequences of the brain and surrounding structures were obtained without intravenous contrast. COMPARISON:  CT head 04/27/2016 FINDINGS: Brain: Image quality degraded by motion. Patchy white matter hyperintensities bilaterally. Hyperintensity in the pons. This is most likely related to chronic microvascular ischemia. Negative for acute infarct.  Negative for hemorrhage or mass. Vascular: Normal arterial flow voids. Fetal origin of the posterior cerebral artery bilaterally. Hypoplastic basilar artery. Skull and upper cervical spine: Negative Sinuses/Orbits: Mild mucosal edema paranasal sinuses. Left cataract  removal. Other: None IMPRESSION: Chronic microvascular ischemic change.  No acute infarct. Negative for mass or metastatic disease on unenhanced imaging. Electronically Signed   By: Franchot Gallo M.D.   On: 04/27/2017 15:23   US Abdomen Limited  Result Date: 04/27/2017 CLINICAL DATA:  Elevated liver function tests. EXAM: US ABDOMEN LIMITED - RIGHT UPPER QUADRANT COMPARISON:  None. FINDINGS: Gallbladder: Solitary gallstone measuring 3.5 cm is noted. Mild gallbladder wall thickening measured at 8 mm is noted. No pericholecystic fluid or sonographic Murphy's sign is noted. Common bile duct: Diameter: 3.5 mm which is within normal limits. Liver: No focal lesion identified. Within normal limits in parenchymal echogenicity. IMPRESSION: Large solitary  gallstone is noted with mild gallbladder wall thickening, but no pericholecystic fluid or sonographic Murphy's sign. If there is clinical concern for cholecystitis, HIDA scan is recommended for further evaluation. Electronically Signed   By: Marijo Conception, M.D.   On: 04/27/2017 16:41   [place] Art/ven Flow Abd Pelv Doppler  Result Date: 04/27/2017 CLINICAL DATA:  Elevated LFTs. EXAM: DUPLEX ULTRASOUND OF LIVER TECHNIQUE: Color and duplex Doppler ultrasound was performed to evaluate the hepatic in-flow and out-flow vessels. COMPARISON:  Right upper quadrant ultrasound 04/27/2017 FINDINGS: Portal Vein Velocities Main:  28 cm/sec Right:  28 cm/sec Left:  18 cm/sec Hepatic Vein Velocities Right:  46 cm/sec Middle:  46 cm/sec Left:  16 cm/sec Hepatic Artery Velocity:  52 cm/sec Splenic Vein Velocity:  27 cm/sec Varices: Absent Ascites: Absent Normal hepatopetal flow in the portal veins. Main portal vein measures up to 1.2 cm. No evidence for portal vein thrombus. Normal hepatofugal flow in the hepatic veins. Normal appearance of the spleen with a calculated splenic volume of 199 mL. IVC is patent. IMPRESSION: Patent portal venous system with normal direction of flow. Patent  hepatic veins. Electronically Signed   By: Markus Daft M.D.   On: 04/27/2017 16:58    Assessment: 76 y.o.  1. Acute encephalopathy, brain MRI (-) 2. Acute liver injury, ? Acute cholecystitis  3. AKI 4. History of breat cancer, on adjuvant letrozole  5. Type 2 DM 6. Mild thrombocytopenia   Plan:  -US showed gallbladder stone, but no definitive sign of cholecystitis, pending HIDA scan  -if HIDA scan negative, I would recommend to have a CT abdomen w contrast to rule out liver metastasis from breast cancer. Pt has mild AKI, likely form dehydration. She is on IVF, hopefully her renal function will improve so she can undergo CT scan  -I agree with antibiotics to cover possible abdominal infection  -will hold letrozole for now.  -I will follow up    Truitt Merle, MD 04/27/2017

## 2017-04-27 NOTE — ED Triage Notes (Addendum)
Pt receiving hormone replacement therapy breast cancer; pt verbalizes right breast pain and n/v/d. Pt hard to direct with answering questions, repeats self, and slow to respond. Pt alert to self, unable to validate other orientation related to pt responding inappropriately repeating "I am taking radiation..." Sister verbalizes spoke with pt last night and "she did not seem herself."

## 2017-04-28 ENCOUNTER — Telehealth: Payer: Self-pay

## 2017-04-28 ENCOUNTER — Ambulatory Visit: Payer: Medicare PPO

## 2017-04-28 ENCOUNTER — Inpatient Hospital Stay (HOSPITAL_COMMUNITY): Payer: Medicare PPO

## 2017-04-28 DIAGNOSIS — G4733 Obstructive sleep apnea (adult) (pediatric): Secondary | ICD-10-CM

## 2017-04-28 DIAGNOSIS — Z9989 Dependence on other enabling machines and devices: Secondary | ICD-10-CM

## 2017-04-28 LAB — COMPREHENSIVE METABOLIC PANEL
ALBUMIN: 2.4 g/dL — AB (ref 3.5–5.0)
ALT: 379 U/L — ABNORMAL HIGH (ref 14–54)
ANION GAP: 7 (ref 5–15)
AST: 118 U/L — AB (ref 15–41)
Alkaline Phosphatase: 170 U/L — ABNORMAL HIGH (ref 38–126)
BUN: 59 mg/dL — AB (ref 6–20)
CO2: 25 mmol/L (ref 22–32)
CREATININE: 1.01 mg/dL — AB (ref 0.44–1.00)
Calcium: 9.3 mg/dL (ref 8.9–10.3)
Chloride: 105 mmol/L (ref 101–111)
GFR calc Af Amer: >60 mL/min (ref >60)
GFR calc non Af Amer: 54 mL/min — ABNORMAL LOW (ref >60)
GLUCOSE: 111 mg/dL — AB (ref 65–99)
Potassium: 3.3 mmol/L — ABNORMAL LOW (ref 3.5–5.1)
SODIUM: 137 mmol/L (ref 135–145)
TOTAL PROTEIN: 5.4 g/dL — AB (ref 6.5–8.1)
Total Bilirubin: 1.4 mg/dL — ABNORMAL HIGH (ref 0.3–1.2)

## 2017-04-28 LAB — BLOOD CULTURE ID PANEL (REFLEXED)
ACINETOBACTER BAUMANNII: NOT DETECTED
CANDIDA ALBICANS: NOT DETECTED
CANDIDA GLABRATA: NOT DETECTED
Candida krusei: NOT DETECTED
Candida parapsilosis: NOT DETECTED
Candida tropicalis: NOT DETECTED
Carbapenem resistance: NOT DETECTED
ENTEROBACTER CLOACAE COMPLEX: NOT DETECTED
ENTEROBACTERIACEAE SPECIES: DETECTED — AB
ESCHERICHIA COLI: DETECTED — AB
Enterococcus species: NOT DETECTED
HAEMOPHILUS INFLUENZAE: NOT DETECTED
Klebsiella oxytoca: NOT DETECTED
Klebsiella pneumoniae: NOT DETECTED
Listeria monocytogenes: NOT DETECTED
NEISSERIA MENINGITIDIS: NOT DETECTED
PSEUDOMONAS AERUGINOSA: NOT DETECTED
Proteus species: NOT DETECTED
STREPTOCOCCUS AGALACTIAE: NOT DETECTED
STREPTOCOCCUS PNEUMONIAE: NOT DETECTED
STREPTOCOCCUS PYOGENES: NOT DETECTED
STREPTOCOCCUS SPECIES: NOT DETECTED
Serratia marcescens: NOT DETECTED
Staphylococcus aureus (BCID): NOT DETECTED
Staphylococcus species: NOT DETECTED

## 2017-04-28 LAB — HEPATITIS PANEL, ACUTE
HEP A IGM: NEGATIVE
HEP B C IGM: NEGATIVE
Hepatitis B Surface Ag: NEGATIVE

## 2017-04-28 LAB — CBC
HEMATOCRIT: 31.8 % — AB (ref 36.0–46.0)
Hemoglobin: 10.3 g/dL — ABNORMAL LOW (ref 12.0–15.0)
MCH: 27.6 pg (ref 26.0–34.0)
MCHC: 32.4 g/dL (ref 30.0–36.0)
MCV: 85.3 fL (ref 78.0–100.0)
PLATELETS: 89 10*3/uL — AB (ref 150–400)
RBC: 3.73 MIL/uL — AB (ref 3.87–5.11)
RDW: 15.5 % (ref 11.5–15.5)
WBC: 13.9 10*3/uL — AB (ref 4.0–10.5)

## 2017-04-28 LAB — GLUCOSE, CAPILLARY
GLUCOSE-CAPILLARY: 188 mg/dL — AB (ref 65–99)
Glucose-Capillary: 73 mg/dL (ref 65–99)
Glucose-Capillary: 125 mg/dL — ABNORMAL HIGH (ref 65–99)
Glucose-Capillary: 185 mg/dL — ABNORMAL HIGH (ref 65–99)

## 2017-04-28 LAB — PROTIME-INR
INR: 1.13
PROTHROMBIN TIME: 14.6 s (ref 11.4–15.2)

## 2017-04-28 MED ORDER — SODIUM CHLORIDE 0.9 % IV BOLUS (SEPSIS)
500.0000 mL | Freq: Once | INTRAVENOUS | Status: AC
  Administered 2017-04-28: 500 mL via INTRAVENOUS

## 2017-04-28 MED ORDER — PREMIER PROTEIN SHAKE
11.0000 [oz_av] | Freq: Two times a day (BID) | ORAL | Status: DC
  Administered 2017-04-29 – 2017-04-30 (×3): 11 [oz_av] via ORAL
  Filled 2017-04-28 (×6): qty 325.31

## 2017-04-28 MED ORDER — TECHNETIUM TC 99M MEBROFENIN IV KIT
5.0000 | PACK | Freq: Once | INTRAVENOUS | Status: AC | PRN
  Administered 2017-04-28: 5 via INTRAVENOUS

## 2017-04-28 NOTE — Progress Notes (Signed)
Pt has home unit cpap and places self on/off.  Rt informed pt to call if she needed assistance.  Rt will monitor.

## 2017-04-28 NOTE — Telephone Encounter (Signed)
Pt did not arrive for scheduled 1015 visit this morning so called and spoke with pts husband Tiffany Arnold who relayed following message: Tiffany Arnold had a lot of trouble sleeping Sunday night and was in a lot of pain so she was taken to [facility] and they sent her to the ED. She was admitted at first with concerns for her liver, but now they are looking at her gallbladder. This was most recent update he had received from pts sister who is at the hospital with pt. She will be in hospital until at least tomorrow. Surgery at this time is not planned but he made it sound like a possibility, they just don't know yet.  This was pts last scheduled visit with Physical Therapy so I let him know that we will keep Tiffany Arnold on hold until we hear from them in case she would like to R/S visit upon being D/C from hospital.

## 2017-04-28 NOTE — Progress Notes (Signed)
Initial Nutrition Assessment  DOCUMENTATION CODES:   Obesity unspecified  INTERVENTION:   Premier Protein BID, each supplement provides 160kcal and 30g protein.   NUTRITION DIAGNOSIS:   Increased nutrient needs related to cancer and cancer related treatments as evidenced by increased estimated needs from protein.  GOAL:   Patient will meet greater than or equal to 90% of their needs  MONITOR:   PO intake, Supplement acceptance, Labs, Weight trends  REASON FOR ASSESSMENT:   Malnutrition Screening Tool    ASSESSMENT:   76 y.o. female with medical history significant of breast cancer diagnosed in 2017 status post chemotherapy and radiation therapy, finished XRT in February 2017, hypertension, hyperlipidemia, sleep apnea, is being brought to the emergency room by patient's sister due to altered mental status.   Unable to meet with pt today; pt away at procedure. RD spoke with pt's daughter in room who reports that pt has had decreased appetite ever since her cancer diagnosis in Feb. 2017 but that her appetite has been really poor since last Thursday. Daughter reports that everything pt eats give her diarrhea. Daughter reports that pt has lost weight; per chart, pt has lost 19lbs(9%) in the past 4 months; this is significant given history. RD will order Premier Protein to help pt meet estimated needs. Per MD note, "US showed gallbladder stone, but no definitive sign of cholecystitis, pending HIDA scan. If HIDA scan negative, I would recommend to have a CT abdomen w contrast to rule out liver metastasis from breast cancer." RD will follow up with full history and nutrition focused physical exam at follow-up. Pt with low potassium today; monitor and supplement as needed per MD discretion.    Medications reviewed and include: aspirin, insulin, ceftriaxone  Labs reviewed: K 3.3(L), BUN 59(H), creat 1.01(H), AlkPhos 170(H), alb 2.4(L), AST 118(H), ALT 379(H), tbili 1.4(H) Wbc-  13.9(H)  Unable to complete Nutrition-Focused physical exam at this time.   Diet Order:  Diet heart healthy/carb modified Room service appropriate? Yes; Fluid consistency: Thin  Skin:  Reviewed, no issues  Last BM:  5/7- diarrhea  Height:   Ht Readings from Last 1 Encounters:  04/27/17 5' (1.524 m)    Weight:   Wt Readings from Last 1 Encounters:  04/27/17 198 lb (89.8 kg)    Ideal Body Weight:  45.4 kg  BMI:  Body mass index is 38.67 kg/m.  Estimated Nutritional Needs:   Kcal:  1600-1900kcal/day   Protein:  99-117g/day   Fluid:  >1.6L/day   EDUCATION NEEDS:   No education needs identified at this time  Koleen Distance, RD, LDN Pager #[phone]

## 2017-04-28 NOTE — Progress Notes (Signed)
PHARMACY - PHYSICIAN COMMUNICATION CRITICAL VALUE ALERT - BLOOD CULTURE IDENTIFICATION (BCID)  Results for orders placed or performed during the hospital encounter of 04/27/17  Blood Culture ID Panel (Reflexed) (Collected: 04/27/2017 11:53 AM)  Result Value Ref Range   Enterococcus species NOT DETECTED NOT DETECTED   Listeria monocytogenes NOT DETECTED NOT DETECTED   Staphylococcus species NOT DETECTED NOT DETECTED   Staphylococcus aureus NOT DETECTED NOT DETECTED   Streptococcus species NOT DETECTED NOT DETECTED   Streptococcus agalactiae NOT DETECTED NOT DETECTED   Streptococcus pneumoniae NOT DETECTED NOT DETECTED   Streptococcus pyogenes NOT DETECTED NOT DETECTED   Acinetobacter baumannii NOT DETECTED NOT DETECTED   Enterobacteriaceae species DETECTED (A) NOT DETECTED   Enterobacter cloacae complex NOT DETECTED NOT DETECTED   Escherichia coli DETECTED (A) NOT DETECTED   Klebsiella oxytoca NOT DETECTED NOT DETECTED   Klebsiella pneumoniae NOT DETECTED NOT DETECTED   Proteus species NOT DETECTED NOT DETECTED   Serratia marcescens NOT DETECTED NOT DETECTED   Carbapenem resistance NOT DETECTED NOT DETECTED   Haemophilus influenzae NOT DETECTED NOT DETECTED   Neisseria meningitidis NOT DETECTED NOT DETECTED   Pseudomonas aeruginosa NOT DETECTED NOT DETECTED   Candida albicans NOT DETECTED NOT DETECTED   Candida glabrata NOT DETECTED NOT DETECTED   Candida krusei NOT DETECTED NOT DETECTED   Candida parapsilosis NOT DETECTED NOT DETECTED   Candida tropicalis NOT DETECTED NOT DETECTED    Name of physician (or Provider) Contacted: K. Schorr  Changes to prescribed antibiotics required: none, already on ceftriaxone 2gm iv q24hr  Nani Skillern Crowford 04/28/2017  4:35 AM

## 2017-04-28 NOTE — Progress Notes (Addendum)
PROGRESS NOTE    Tiffany Arnold  AVW:[id number] DOB: [birth year] DOA: 04/27/2017 PCP: [name], NP   Brief Narrative: Tiffany Arnold is a 76 y.o. female with a history of breast cancer status post chemotherapy and radiation, hypertension, hyperlipidemia, sleep apnea. She presents secondary to change in mental status (word finding difficulties) and elevated liver enzymes. Concern for possible gallbladder etiology. Ultrasound significant for cholelithiasis without cholecystitis. HIDA scan is pending.   Assessment & Plan:   Active Problems:   Severe obesity (BMI >= 40) (HCC)   Essential hypertension   HLD (hyperlipidemia)   Type 2 diabetes mellitus without complication (HCC)   OSA on CPAP   Breast cancer of lower-inner quadrant of right female breast (HCC)   IBS (irritable bowel syndrome)   AKI (acute kidney injury) ([place])   Acute metabolic encephalopathy   Elevated LFTs   Acute encephalopathy Word finding difficulties Workup so far is negative. No evidence of acute CVA. Oncology does not feel current presentation is secondary to chemotherapy. Conversion disorder is in the differential. Possibly secondary to bacteremia (1/2 bottles positive), although would not expect this. -consult neurology  Acute liver injury Unknown etiology. RUQ ultrasound significant for cholelithiasis without cholecystectomy. Possible this could have been virally mediated and is possibly resolving now. Hepatitis panel negative. -HIDA scan pending  Gram-negative rod bacteremia 1 out of 2 bottles positive. Urinalysis suggest UTI -urine culture -blood culture ID and sensitivities pending -continue ceftriaxone  Acute kidney injury Likely secondary to poor po intake per history with family. Improved with IV fluids -continue IV fluids  Tyle 2 diabetes mellitus -continue SSI  Breast cancer Patient is s/p chemotherapy and radiation. Sees Dr. Burr Medico as an outpatient -oncology  recommendations  Thrombocytopenia New problem. Possibly related to liver injury. Doubt HITT. Trended down slightly -repeat CBC -oncology recommendations   DVT prophylaxis: SCDs Code Status: Full code Family Communication: Daughter at bedside Disposition Plan: Discharge pending continued workup   Consultants:   Oncology  Neurology  Procedures:   None  Antimicrobials:  Ceftriaxone    Subjective: Patient reports no issues overnight. Still with some RUQ abdominal pain. No nausea, vomiting or diarrhea.  Objective: Vitals:   04/27/17 1658 04/27/17 1749 04/27/17 2133 04/28/17 0512  BP: 94/80 105/71 94/62 97/63   Pulse: 73 65 77 (!) 57  Resp: 18 18 18 17   Temp:  97.8 F (36.6 C) 97.9 F (36.6 C) 97.6 F (36.4 C)  TempSrc:  Oral Oral Axillary  SpO2: 95% 98% 96% 98%  Weight:      Height:        Intake/Output Summary (Last 24 hours) at 04/28/17 1320 Last data filed at 04/28/17 1000  Gross per 24 hour  Intake                0 ml  Output                3 ml  Net               -3 ml   Filed Weights   04/27/17 1137  Weight: 89.8 kg (198 lb)    Examination:  General exam: Appears calm and comfortable Respiratory system: Clear to auscultation. Respiratory effort normal. Cardiovascular system: S1 & S2 heard, RRR. No murmurs, rubs, gallops or clicks. Gastrointestinal system: Abdomen is nondistended, soft and nontender. Normal bowel sounds heard. Central nervous system: Alert and oriented. No focal neurological deficits. Difficulty with word finding. Difficult to express her thoughts  Extremities: No  edema. No calf tenderness Skin: No cyanosis. No rashes Psychiatry: Judgement and insight appear normal. Mood & affect appropriate.     Data Reviewed: I have personally reviewed following labs and imaging studies  CBC:  Recent Labs Lab 04/27/17 1153 04/27/17 1626 04/28/17 0411  WBC 14.1*  --  13.9*  NEUTROABS 13.7*  --   --   HGB 11.1*  --  10.3*  HCT 33.5*   --  31.8*  MCV 85.5  --  85.3  PLT 127* 110* 89*   Basic Metabolic Panel:  Recent Labs Lab 04/27/17 1153 04/28/17 0411  NA 136 137  K 4.1 3.3*  CL 103 105  CO2 23 25  GLUCOSE 54* 111*  BUN 62* 59*  CREATININE 1.54* 1.01*  CALCIUM 9.4 9.3   GFR: Estimated Creatinine Clearance: 49.5 mL/min (A) (by C-G formula based on SCr of 1.01 mg/dL (H)). Liver Function Tests:  Recent Labs Lab 04/27/17 1153 04/28/17 0411  AST 316* 118*  ALT 625* 379*  ALKPHOS 258* 170*  BILITOT 3.8* 1.4*  PROT 6.3* 5.4*  ALBUMIN 3.2* 2.4*   No results for input(s): LIPASE, AMYLASE in the last 168 hours.  Recent Labs Lab 04/27/17 1628  AMMONIA 29   Coagulation Profile:  Recent Labs Lab 04/27/17 1153 04/27/17 1626 04/28/17 0411  INR 1.59 1.46 1.13   Cardiac Enzymes:  Recent Labs Lab 04/27/17 1322  CKTOTAL 42   BNP (last 3 results) No results for input(s): PROBNP in the last 8760 hours. HbA1C: No results for input(s): HGBA1C in the last 72 hours. CBG:  Recent Labs Lab 04/27/17 1526 04/27/17 1956 04/27/17 2027 04/27/17 2130 04/28/17 1025  GLUCAP 101* 47* 55* 101* 73   Lipid Profile: No results for input(s): CHOL, HDL, LDLCALC, TRIG, CHOLHDL, LDLDIRECT in the last 72 hours. Thyroid Function Tests:  Recent Labs  04/27/17 1823  TSH 0.656   Anemia Panel:  Recent Labs  04/27/17 1823  VITAMINB12 1,443*   Sepsis Labs:  Recent Labs Lab 04/27/17 1158 04/27/17 1644  LATICACIDVEN 1.92* 1.62    Recent Results (from the past 240 hour(s))  Culture, blood (Routine x 2)     Status: Abnormal (Preliminary result)   Collection Time: 04/27/17 11:53 AM  Result Value Ref Range Status   Specimen Description BLOOD LEFT ANTECUBITAL  Final   Special Requests   Final    BOTTLES DRAWN AEROBIC AND ANAEROBIC Blood Culture adequate volume   Culture  Setup Time   Final    IN BOTH AEROBIC AND ANAEROBIC BOTTLES GRAM NEGATIVE RODS CRITICAL RESULT CALLED TO, READ BACK BY AND VERIFIED  WITH: TO JGRIMSELY(PHARMd) BY TCLEVELAND 04/28/2017 AT 4:25AM    Culture (A)  Final    ESCHERICHIA COLI CULTURE REINCUBATED FOR BETTER GROWTH Performed at [facility], [place] [address] [place], [place] [address]    Report Status PENDING  Incomplete  Blood Culture ID Panel (Reflexed)     Status: Abnormal   Collection Time: 04/27/17 11:53 AM  Result Value Ref Range Status   Enterococcus species NOT DETECTED NOT DETECTED Final   Listeria monocytogenes NOT DETECTED NOT DETECTED Final   Staphylococcus species NOT DETECTED NOT DETECTED Final   Staphylococcus aureus NOT DETECTED NOT DETECTED Final   Streptococcus species NOT DETECTED NOT DETECTED Final   Streptococcus agalactiae NOT DETECTED NOT DETECTED Final   Streptococcus pneumoniae NOT DETECTED NOT DETECTED Final   Streptococcus pyogenes NOT DETECTED NOT DETECTED Final   Acinetobacter baumannii NOT DETECTED NOT DETECTED Final  Enterobacteriaceae species DETECTED (A) NOT DETECTED Final    Comment: Enterobacteriaceae represent a large family of gram-negative bacteria, not a single organism. CRITICAL RESULT CALLED TO, READ BACK BY AND VERIFIED WITH: TO JGRIMSELY(PHARMd) BY TCLEVELAND 04/28/2017 AT 4:25AM    Enterobacter cloacae complex NOT DETECTED NOT DETECTED Final   Escherichia coli DETECTED (A) NOT DETECTED Final    Comment: CRITICAL RESULT CALLED TO, READ BACK BY AND VERIFIED WITH: TO JGRIMSELY(PHARMd) BY TCLEVELAND 04/28/2017 AT 4:25AM    Klebsiella oxytoca NOT DETECTED NOT DETECTED Final   Klebsiella pneumoniae NOT DETECTED NOT DETECTED Final   Proteus species NOT DETECTED NOT DETECTED Final   Serratia marcescens NOT DETECTED NOT DETECTED Final   Carbapenem resistance NOT DETECTED NOT DETECTED Final   Haemophilus influenzae NOT DETECTED NOT DETECTED Final   Neisseria meningitidis NOT DETECTED NOT DETECTED Final   Pseudomonas aeruginosa NOT DETECTED NOT DETECTED Final   Candida albicans NOT DETECTED NOT DETECTED Final    Candida glabrata NOT DETECTED NOT DETECTED Final   Candida krusei NOT DETECTED NOT DETECTED Final   Candida parapsilosis NOT DETECTED NOT DETECTED Final   Candida tropicalis NOT DETECTED NOT DETECTED Final    Comment: Performed at [facility], [place] [address], [place], [place] [address]         Radiology Studies: Dg Chest 2 View  Result Date: 04/27/2017 CLINICAL DATA:  Pt receiving hormone replacement therapy breast cancer. Pt verbalizes right breast pain and n/v/d. EXAM: CHEST  2 VIEW COMPARISON:  None. FINDINGS: There is bilateral mild interstitial thickening. There is no focal parenchymal opacity. There is no pleural effusion or pneumothorax. There is stable cardiomegaly. 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 There are surgical clips in the right axilla from prior axillary dissection. IMPRESSION: 1. Cardiomegaly with mild pulmonary vascular congestion. Electronically Signed   By: Kathreen Devoid   On: 04/27/2017 12:17   Ct Head Wo Contrast  Result Date: 04/27/2017 CLINICAL DATA:  Aphasia EXAM: CT HEAD WITHOUT CONTRAST TECHNIQUE: Contiguous axial images were obtained from the base of the skull through the vertex without intravenous contrast. COMPARISON:  None. FINDINGS: Brain: No acute intracranial abnormality. Specifically, no hemorrhage, hydrocephalus, mass lesion, acute infarction, or significant intracranial injury. Patchy scattered small vessel disease throughout the deep white matter. Vascular: No hyperdense vessel or unexpected calcification. Skull: No acute calvarial abnormality. Sinuses/Orbits: Visualized paranasal sinuses and mastoids clear. Orbital soft tissues unremarkable. 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 No acute intracranial abnormality. Electronically Signed   By: Rolm Baptise M.D.   On: 04/27/2017 12:17   Mr Brain Wo Contrast  Result Date: 04/27/2017 CLINICAL DATA:  Aphasia.   History of breast cancer. EXAM: MRI HEAD WITHOUT CONTRAST TECHNIQUE: Multiplanar, multiecho pulse sequences of the brain and surrounding structures were obtained without intravenous contrast. COMPARISON:  CT head 04/27/2016 FINDINGS: Brain: Image quality degraded by motion. Patchy white matter hyperintensities bilaterally. Hyperintensity in the pons. This is most likely related to chronic microvascular ischemia. Negative for acute infarct.  Negative for hemorrhage or mass. Vascular: Normal arterial flow voids. Fetal origin of the posterior cerebral artery bilaterally. Hypoplastic basilar artery. Skull and upper cervical spine: Negative Sinuses/Orbits: Mild mucosal edema paranasal sinuses. Left cataract removal. Other: None IMPRESSION: Chronic microvascular ischemic change.  No acute infarct. Negative for mass or metastatic disease on unenhanced imaging. Electronically Signed   By: Franchot Gallo M.D.   On: 04/27/2017 15:23   US Abdomen  Limited  Result Date: 04/27/2017 CLINICAL DATA:  Elevated liver function tests. EXAM: US ABDOMEN LIMITED - RIGHT UPPER QUADRANT COMPARISON:  None. FINDINGS: Gallbladder: Solitary gallstone measuring 3.5 cm is noted. Mild gallbladder wall thickening measured at 8 mm is noted. No pericholecystic fluid or sonographic Murphy's sign is noted. Common bile duct: Diameter: 3.5 mm which is within normal limits. Liver: No focal lesion identified. Within normal limits in parenchymal echogenicity. IMPRESSION: Large solitary gallstone is noted with mild gallbladder wall thickening, but no pericholecystic fluid or sonographic Murphy's sign. If there is clinical concern for cholecystitis, HIDA scan is recommended for further evaluation. Electronically Signed   By: Marijo Conception, M.D.   On: 04/27/2017 16:41   [place] Art/ven Flow Abd Pelv Doppler  Result Date: 04/27/2017 CLINICAL DATA:  Elevated LFTs. EXAM: DUPLEX ULTRASOUND OF LIVER TECHNIQUE: Color and duplex Doppler ultrasound was performed to  evaluate the hepatic in-flow and out-flow vessels. COMPARISON:  Right upper quadrant ultrasound 04/27/2017 FINDINGS: Portal Vein Velocities Main:  28 cm/sec Right:  28 cm/sec Left:  18 cm/sec Hepatic Vein Velocities Right:  46 cm/sec Middle:  46 cm/sec Left:  16 cm/sec Hepatic Artery Velocity:  52 cm/sec Splenic Vein Velocity:  27 cm/sec Varices: Absent Ascites: Absent Normal hepatopetal flow in the portal veins. Main portal vein measures up to 1.2 cm. No evidence for portal vein thrombus. Normal hepatofugal flow in the hepatic veins. Normal appearance of the spleen with a calculated splenic volume of 199 mL. IVC is patent. IMPRESSION: Patent portal venous system with normal direction of flow. Patent hepatic veins. Electronically Signed   By: Markus Daft M.D.   On: 04/27/2017 16:58        Scheduled Meds: . aspirin  81 mg Oral Daily  . insulin aspart  0-9 Units Subcutaneous TID WC  . oxybutynin  10 mg Oral QHS  . sertraline  25 mg Oral QHS   Continuous Infusions: . cefTRIAXone (ROCEPHIN)  IV Stopped (04/27/17 2215)  . dextrose 5 % and 0.45% NaCl 75 mL/hr at 04/27/17 2045     LOS: 1 day     Cordelia Poche, MD [facility] 04/28/2017, 1:20 PM Pager: [phone]  If 7PM-7AM, please contact night-coverage [email or web address] Password [email or web address] 04/28/2017, 1:20 PM

## 2017-04-29 ENCOUNTER — Inpatient Hospital Stay (HOSPITAL_COMMUNITY)
Admit: 2017-04-29 | Discharge: 2017-04-29 | Disposition: A | Discharge status: Home / Self Care | Payer: Medicare PPO | Attending: Family Medicine | Admitting: Family Medicine

## 2017-04-29 DIAGNOSIS — A4151 Sepsis due to Escherichia coli [E. coli]: Principal | ICD-10-CM

## 2017-04-29 DIAGNOSIS — R7989 Other specified abnormal findings of blood chemistry: Secondary | ICD-10-CM

## 2017-04-29 DIAGNOSIS — I10 Essential (primary) hypertension: Secondary | ICD-10-CM

## 2017-04-29 LAB — CBC
HCT: 33.9 % — ABNORMAL LOW (ref 36.0–46.0)
HEMOGLOBIN: 11.3 g/dL — AB (ref 12.0–15.0)
MCH: 28 pg (ref 26.0–34.0)
MCHC: 33.3 g/dL (ref 30.0–36.0)
MCV: 84.1 fL (ref 78.0–100.0)
Platelets: 55 10*3/uL — ABNORMAL LOW (ref 150–400)
RBC: 4.03 MIL/uL (ref 3.87–5.11)
RDW: 15.6 % — ABNORMAL HIGH (ref 11.5–15.5)
WBC: 7.3 10*3/uL (ref 4.0–10.5)

## 2017-04-29 LAB — COMPREHENSIVE METABOLIC PANEL
ALK PHOS: 227 U/L — AB (ref 38–126)
ALT: 239 U/L — AB (ref 14–54)
AST: 42 U/L — AB (ref 15–41)
Albumin: 2.2 g/dL — ABNORMAL LOW (ref 3.5–5.0)
Anion gap: 8 (ref 5–15)
BUN: 49 mg/dL — AB (ref 6–20)
CALCIUM: 9.1 mg/dL (ref 8.9–10.3)
CHLORIDE: 107 mmol/L (ref 101–111)
CO2: 23 mmol/L (ref 22–32)
Creatinine, Ser: 0.73 mg/dL (ref 0.44–1.00)
GFR calc Af Amer: >60 mL/min (ref >60)
Glucose, Bld: 131 mg/dL — ABNORMAL HIGH (ref 65–99)
Potassium: 3.4 mmol/L — ABNORMAL LOW (ref 3.5–5.1)
SODIUM: 138 mmol/L (ref 135–145)
Total Bilirubin: 1.1 mg/dL (ref 0.3–1.2)
Total Protein: 5.2 g/dL — ABNORMAL LOW (ref 6.5–8.1)

## 2017-04-29 LAB — GLUCOSE, CAPILLARY
GLUCOSE-CAPILLARY: 91 mg/dL (ref 65–99)
GLUCOSE-CAPILLARY: 127 mg/dL — AB (ref 65–99)
Glucose-Capillary: 112 mg/dL — ABNORMAL HIGH (ref 65–99)
Glucose-Capillary: 153 mg/dL — ABNORMAL HIGH (ref 65–99)

## 2017-04-29 MED ORDER — HYDROCODONE-ACETAMINOPHEN 5-325 MG PO TABS
2.0000 | ORAL_TABLET | Freq: Once | ORAL | Status: AC
  Administered 2017-04-29: 2 via ORAL
  Filled 2017-04-29: qty 2

## 2017-04-29 NOTE — Procedures (Signed)
History: 76 year old female being evaluated for altered mental status  Sedation: None  Technique: This is a 21 channel routine scalp EEG performed at the bedside with bipolar and monopolar montages arranged in accordance to the international 10/20 system of electrode placement. One channel was dedicated to EKG recording.    Background: The background consists of intermixed alpha and beta activities. There is a well defined posterior dominant rhythm of 9 Hz that is well-organized and attenuates with eye opening. Even during periods of maximal wakefulness, there is mild generalized irregular delta activity intruding on the background. Sleep is recorded with normal appearing structures.   Photic stimulation: Physiologic driving is not performed  EEG Abnormalities: 1) mild generalized irregular slow activity  Clinical Interpretation: This EEG is consistent with a mild generalized non-specific cerebral dysfunction(encephalopathy). There was no seizure or seizure predisposition recorded on this study. Please note that a normal EEG does not preclude the possibility of epilepsy.   Roland Rack, MD [facility] [phone]  If 7pm- 7am, please page neurology on call as listed in [place].

## 2017-04-29 NOTE — Progress Notes (Signed)
Pt. able to place on indepedantly, family member @ bedside, aware to notify if needed.

## 2017-04-29 NOTE — Progress Notes (Signed)
PROGRESS NOTE    Tiffany Arnold  ONG:[id number] DOB: Apr 19, 1941 DOA: 04/27/2017 PCP: [name], NP    Brief Narrative:  76 yo female presents with altered mental status. Patient known to have breast cancer diagnoses 2017, sp chemotherapy and radiation therapy, completed 01/2016. Her family noted her to be confused. On the initial examination, blood pressure was low at 72/40, she was awake and alert, non focal. LFT were elevated, ammonia was 29. Concern for gallbladder etiology, positive gallstones,  HIDA scan ordered.    Assessment & Plan:   Active Problems:   Severe obesity (BMI >= 40) (HCC)   Essential hypertension   HLD (hyperlipidemia)   Type 2 diabetes mellitus without complication (HCC)   OSA on CPAP   Breast cancer of lower-inner quadrant of right female breast (HCC)   IBS (irritable bowel syndrome)   AKI (acute kidney injury) ([place])   Acute metabolic encephalopathy   Elevated LFTs   1. Acute encephalopathy. Clinically improving, patient more awake and alert, will avoid narcotics. Follow with physical therapy recommendations. Continue treatment of urine infection.   2. Acute liver failure. Suspected liver shock due to sepsis, AST down to 42, ALT 239 with total bilirubins 1,1. Continue to follow on liver function. Ammonia not elevated.   3. Urine infection with bacteremia with gram negative - E coli , complicated with sepsis (present on admission). Will continue antibiotic therapy with ceftriaxone, will reduce rate of IV fluids to prevent volume overload, will follow a restrictive IV fluids strategy. Follow on culture sensitivities.   4. AKI. Renal function with improved cr down to 0.73, from peak 1.54, will continue gentle hydration with saline. Follow up renal function in am, avoid hypotension or nephrotoxic medications.   5. T2DM. Will continue glucose cover and monitoring, patient tolerating po well. Capillary glucose [id number].  6. Breast cancer. Will follow as  outpatient.   7. Depression. Continue sertraline.    DVT prophylaxis: scd Code Status: full  Family Communication: patient's daughter at the bedside, all questions were addressed.  Disposition Plan: home   Consultants:     Procedures:     Antimicrobials:    ceftriaxone   Subjective: Patient feeling better, no nausea or vomiting, more awake and alert. Last night had hydrocodone for back pain, patient was sedated until this am.   Objective: Vitals:   04/28/17 1520 04/28/17 1615 04/28/17 2115 04/29/17 0443  BP: (!) 70/35 (!) 84/57 105/68 91/63  Pulse:   79 83  Resp:   18 20  Temp:   98 F (36.7 C) 98.1 F (36.7 C)  TempSrc:   Oral Oral  SpO2:   100% 90%  Weight:      Height:        Intake/Output Summary (Last 24 hours) at 04/29/17 1115 Last data filed at 04/29/17 0500  Gross per 24 hour  Intake             4158 ml  Output                0 ml  Net             4158 ml   Filed Weights   04/27/17 1137  Weight: 89.8 kg (198 lb)    Examination:  General exam: deconditioned E ENT. No pallor or icterus, oral mucosa moist.  Respiratory system: Clear to auscultation. Respiratory effort normal. No wheezing, rales or rhonchi.  Cardiovascular system: S1 & S2 heard, RRR. No JVD, murmurs, rubs, gallops or  clicks. No pedal edema. Gastrointestinal system: Abdomen is nondistended, soft and nontender. No organomegaly or masses felt. Normal bowel sounds heard. Central nervous system: Alert and oriented. No focal neurological deficits. Extremities: Symmetric 5 x 5 power. Skin: No rashes, lesions or ulcers     Data Reviewed: I have personally reviewed following labs and imaging studies  CBC:  Recent Labs Lab 04/27/17 1153 04/27/17 1626 04/28/17 0411 04/29/17 0402  WBC 14.1*  --  13.9* 7.3  NEUTROABS 13.7*  --   --   --   HGB 11.1*  --  10.3* 11.3*  HCT 33.5*  --  31.8* 33.9*  MCV 85.5  --  85.3 84.1  PLT 127* 110* 89* 55*   Basic Metabolic Panel:  Recent  Labs Lab 04/27/17 1153 04/28/17 0411 04/29/17 0402  NA 136 137 138  K 4.1 3.3* 3.4*  CL 103 105 107  CO2 23 25 23   GLUCOSE 54* 111* 131*  BUN 62* 59* 49*  CREATININE 1.54* 1.01* 0.73  CALCIUM 9.4 9.3 9.1   GFR: Estimated Creatinine Clearance: 62.5 mL/min (by C-G formula based on SCr of 0.73 mg/dL). Liver Function Tests:  Recent Labs Lab 04/27/17 1153 04/28/17 0411 04/29/17 0402  AST 316* 118* 42*  ALT 625* 379* 239*  ALKPHOS 258* 170* 227*  BILITOT 3.8* 1.4* 1.1  PROT 6.3* 5.4* 5.2*  ALBUMIN 3.2* 2.4* 2.2*   No results for input(s): LIPASE, AMYLASE in the last 168 hours.  Recent Labs Lab 04/27/17 1628  AMMONIA 29   Coagulation Profile:  Recent Labs Lab 04/27/17 1153 04/27/17 1626 04/28/17 0411  INR 1.59 1.46 1.13   Cardiac Enzymes:  Recent Labs Lab 04/27/17 1322  CKTOTAL 42   BNP (last 3 results) No results for input(s): PROBNP in the last 8760 hours. HbA1C: No results for input(s): HGBA1C in the last 72 hours. CBG:  Recent Labs Lab 04/28/17 1025 04/28/17 1323 04/28/17 1528 04/28/17 2112 04/29/17 0745  GLUCAP 73 125* 185* 188* 112*   Lipid Profile: No results for input(s): CHOL, HDL, LDLCALC, TRIG, CHOLHDL, LDLDIRECT in the last 72 hours. Thyroid Function Tests:  Recent Labs  04/27/17 1823  TSH 0.656   Anemia Panel:  Recent Labs  04/27/17 1823  VITAMINB12 1,443*   Sepsis Labs:  Recent Labs Lab 04/27/17 1158 04/27/17 1644  LATICACIDVEN 1.92* 1.62    Recent Results (from the past 240 hour(s))  Culture, blood (Routine x 2)     Status: None (Preliminary result)   Collection Time: 04/27/17 11:50 AM  Result Value Ref Range Status   Specimen Description BLOOD LEFT ARM  Final   Special Requests IN PEDIATRIC BOTTLE BCAV  Final   Culture   Final    NO GROWTH 1 DAY Performed at [facility], [place] [address] [place], [place] [address]    Report Status PENDING  Incomplete  Culture, blood (Routine x 2)     Status:  Abnormal (Preliminary result)   Collection Time: 04/27/17 11:53 AM  Result Value Ref Range Status   Specimen Description BLOOD LEFT ANTECUBITAL  Final   Special Requests   Final    BOTTLES DRAWN AEROBIC AND ANAEROBIC Blood Culture adequate volume   Culture  Setup Time   Final    IN BOTH AEROBIC AND ANAEROBIC BOTTLES GRAM NEGATIVE RODS CRITICAL RESULT CALLED TO, READ BACK BY AND VERIFIED WITH: TO JGRIMSELY(PHARMd) BY TCLEVELAND 04/28/2017 AT 4:25AM    Culture (A)  Final    ESCHERICHIA COLI SUSCEPTIBILITIES TO FOLLOW  Performed at [facility], [place] [address], [place], [place] [address]    Report Status PENDING  Incomplete  Blood Culture ID Panel (Reflexed)     Status: Abnormal   Collection Time: 04/27/17 11:53 AM  Result Value Ref Range Status   Enterococcus species NOT DETECTED NOT DETECTED Final   Listeria monocytogenes NOT DETECTED NOT DETECTED Final   Staphylococcus species NOT DETECTED NOT DETECTED Final   Staphylococcus aureus NOT DETECTED NOT DETECTED Final   Streptococcus species NOT DETECTED NOT DETECTED Final   Streptococcus agalactiae NOT DETECTED NOT DETECTED Final   Streptococcus pneumoniae NOT DETECTED NOT DETECTED Final   Streptococcus pyogenes NOT DETECTED NOT DETECTED Final   Acinetobacter baumannii NOT DETECTED NOT DETECTED Final   Enterobacteriaceae species DETECTED (A) NOT DETECTED Final    Comment: Enterobacteriaceae represent a large family of gram-negative bacteria, not a single organism. CRITICAL RESULT CALLED TO, READ BACK BY AND VERIFIED WITH: TO JGRIMSELY(PHARMd) BY TCLEVELAND 04/28/2017 AT 4:25AM    Enterobacter cloacae complex NOT DETECTED NOT DETECTED Final   Escherichia coli DETECTED (A) NOT DETECTED Final    Comment: CRITICAL RESULT CALLED TO, READ BACK BY AND VERIFIED WITH: TO JGRIMSELY(PHARMd) BY TCLEVELAND 04/28/2017 AT 4:25AM    Klebsiella oxytoca NOT DETECTED NOT DETECTED Final   Klebsiella pneumoniae NOT DETECTED NOT DETECTED Final    Proteus species NOT DETECTED NOT DETECTED Final   Serratia marcescens NOT DETECTED NOT DETECTED Final   Carbapenem resistance NOT DETECTED NOT DETECTED Final   Haemophilus influenzae NOT DETECTED NOT DETECTED Final   Neisseria meningitidis NOT DETECTED NOT DETECTED Final   Pseudomonas aeruginosa NOT DETECTED NOT DETECTED Final   Candida albicans NOT DETECTED NOT DETECTED Final   Candida glabrata NOT DETECTED NOT DETECTED Final   Candida krusei NOT DETECTED NOT DETECTED Final   Candida parapsilosis NOT DETECTED NOT DETECTED Final   Candida tropicalis NOT DETECTED NOT DETECTED Final    Comment: Performed at [facility], [place] [address] [place], [place] [address]         Radiology Studies: Dg Chest 2 View  Result Date: 04/27/2017 CLINICAL DATA:  Pt receiving hormone replacement therapy breast cancer. Pt verbalizes right breast pain and n/v/d. EXAM: CHEST  2 VIEW COMPARISON:  None. FINDINGS: There is bilateral mild interstitial thickening. There is no focal parenchymal opacity. There is no pleural effusion or pneumothorax. There is stable cardiomegaly. 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 There are surgical clips in the right axilla from prior axillary dissection. IMPRESSION: 1. Cardiomegaly with mild pulmonary vascular congestion. Electronically Signed   By: Kathreen Devoid   On: 04/27/2017 12:17   Ct Head Wo Contrast  Result Date: 04/27/2017 CLINICAL DATA:  Aphasia EXAM: CT HEAD WITHOUT CONTRAST TECHNIQUE: Contiguous axial images were obtained from the base of the skull through the vertex without intravenous contrast. COMPARISON:  None. FINDINGS: Brain: No acute intracranial abnormality. Specifically, no hemorrhage, hydrocephalus, mass lesion, acute infarction, or significant intracranial injury. Patchy scattered small vessel disease throughout the deep white matter. Vascular: No hyperdense vessel or unexpected calcification. Skull: No acute calvarial abnormality.  Sinuses/Orbits: Visualized paranasal sinuses and mastoids clear. Orbital soft tissues unremarkable. 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 No acute intracranial abnormality. Electronically Signed   By: Rolm Baptise M.D.   On: 04/27/2017 12:17   Mr Brain Wo Contrast  Result Date: 04/27/2017 CLINICAL DATA:  Aphasia.  History of breast cancer. EXAM:  MRI HEAD WITHOUT CONTRAST TECHNIQUE: Multiplanar, multiecho pulse sequences of the brain and surrounding structures were obtained without intravenous contrast. COMPARISON:  CT head 04/27/2016 FINDINGS: Brain: Image quality degraded by motion. Patchy white matter hyperintensities bilaterally. Hyperintensity in the pons. This is most likely related to chronic microvascular ischemia. Negative for acute infarct.  Negative for hemorrhage or mass. Vascular: Normal arterial flow voids. Fetal origin of the posterior cerebral artery bilaterally. Hypoplastic basilar artery. Skull and upper cervical spine: Negative Sinuses/Orbits: Mild mucosal edema paranasal sinuses. Left cataract removal. Other: None IMPRESSION: Chronic microvascular ischemic change.  No acute infarct. Negative for mass or metastatic disease on unenhanced imaging. Electronically Signed   By: Franchot Gallo M.D.   On: 04/27/2017 15:23   Nm Hepato W/eject Fract  Result Date: 04/28/2017 CLINICAL DATA:  76 year old female with possible cholecystitis, nausea and vomiting EXAM: NUCLEAR MEDICINE HEPATOBILIARY IMAGING WITH GALLBLADDER EF TECHNIQUE: Sequential images of the abdomen were obtained out to 60 minutes following intravenous administration of radiopharmaceutical. After oral ingestion of Ensure, gallbladder ejection fraction was determined. At 60 min, normal ejection fraction is greater than 33%. RADIOPHARMACEUTICALS:  5.0 mCi Tc-34m  Choletec IV COMPARISON:  Ultrasound 04/27/2017 FINDINGS: Insert HIDA Planar images of the upper abdomen  performed for 1 hour after administration of radiopharmaceutical targeting the hepatic biliary system. Uniform uptake within the liver is observed. Excretion into the central biliary ducts is identified at 15 minutes There is antegrade flow of radiotracer within the extrahepatic biliary ducts identified at 20 minutes, with antegrade flow into the GI system. No evidence of radiopharmaceutical accumulation outside of the biliary system or the GI system. Calculated gallbladder ejection fraction is 76.7%. (Normal gallbladder ejection fraction with Ensure is greater than 33%.) IMPRESSION: Hepatobiliary nuclear medicine study demonstrates patent extrahepatic biliary ductal system, with normal ejection fraction of the gallbladder. Electronically Signed   By: Corrie Mckusick D.O.   On: 04/28/2017 13:41   US Abdomen Limited  Result Date: 04/27/2017 CLINICAL DATA:  Elevated liver function tests. EXAM: US ABDOMEN LIMITED - RIGHT UPPER QUADRANT COMPARISON:  None. FINDINGS: Gallbladder: Solitary gallstone measuring 3.5 cm is noted. Mild gallbladder wall thickening measured at 8 mm is noted. No pericholecystic fluid or sonographic Murphy's sign is noted. Common bile duct: Diameter: 3.5 mm which is within normal limits. Liver: No focal lesion identified. Within normal limits in parenchymal echogenicity. IMPRESSION: Large solitary gallstone is noted with mild gallbladder wall thickening, but no pericholecystic fluid or sonographic Murphy's sign. If there is clinical concern for cholecystitis, HIDA scan is recommended for further evaluation. Electronically Signed   By: Marijo Conception, M.D.   On: 04/27/2017 16:41   [place] Art/ven Flow Abd Pelv Doppler  Result Date: 04/27/2017 CLINICAL DATA:  Elevated LFTs. EXAM: DUPLEX ULTRASOUND OF LIVER TECHNIQUE: Color and duplex Doppler ultrasound was performed to evaluate the hepatic in-flow and out-flow vessels. COMPARISON:  Right upper quadrant ultrasound 04/27/2017 FINDINGS: Portal Vein  Velocities Main:  28 cm/sec Right:  28 cm/sec Left:  18 cm/sec Hepatic Vein Velocities Right:  46 cm/sec Middle:  46 cm/sec Left:  16 cm/sec Hepatic Artery Velocity:  52 cm/sec Splenic Vein Velocity:  27 cm/sec Varices: Absent Ascites: Absent Normal hepatopetal flow in the portal veins. Main portal vein measures up to 1.2 cm. No evidence for portal vein thrombus. Normal hepatofugal flow in the hepatic veins. Normal appearance of the spleen with a calculated splenic volume of 199 mL. IVC is patent. IMPRESSION: Patent portal venous system with normal direction of flow.  Patent hepatic veins. Electronically Signed   By: Markus Daft M.D.   On: 04/27/2017 16:58        Scheduled Meds: . aspirin  81 mg Oral Daily  . insulin aspart  0-9 Units Subcutaneous TID WC  . oxybutynin  10 mg Oral QHS  . protein supplement shake  11 oz Oral BID BM  . sertraline  25 mg Oral QHS   Continuous Infusions: . cefTRIAXone (ROCEPHIN)  IV Stopped (04/28/17 1910)  . dextrose 5 % and 0.45% NaCl 75 mL/hr at 04/27/17 2045     LOS: 2 days       Tawni Millers, MD [facility] Pager [phone]  If 7PM-7AM, please contact night-coverage [email or web address] Password [email or web address] 04/29/2017, 11:15 AM

## 2017-04-29 NOTE — ED Provider Notes (Signed)
[facility] DEPT Provider Note   CSN: [id number] Arrival date & time: 04/27/17  1110     History   Chief Complaint Chief Complaint  Patient presents with  . Cancer  . Altered Mental Status    HPI Tiffany Arnold is a 76 y.o. female.  HPI 76 y.o. female with medical history significant of breast cancer diagnosed in 2017 status post chemotherapy and radiation therapy, hypertension, hyperlipidemia, sleep apnea, is being brought to the emergency room by patient's sister due to altered mental status. Pt is last normal > 2 days ago. Per sister, she called patient yday and noted that she was not coherent with her speech, and decided to check again today to see if there was any improvement. Pt had actually not improved, so she brought her to the ER. Pt is following commands, but is having difficulty expressing her self. There is no fall. Pt denies nausea, emesis, fevers, chills, chest pains, shortness of breath, headaches, abdominal pain, uti like symptoms.   Past Medical History:  Diagnosis Date  . Allergy   . Arthritis   . Asthma   . Cancer ([facility]) 07/29/2016   right breast  . Cataract of both eyes   . Chicken pox   . Colon polyps   . Dog bite of index finger 07/23/2016   Left index finger  . GERD (gastroesophageal reflux disease)   . History of radiation therapy 12/24/16- 02/03/17   Right Breast 50.4 Gy in 28 fractions, Right Axilla 45 Gy in 25 fractions.  . Hyperlipidemia   . Hypertension   . IBS (irritable bowel syndrome)   . Phlebitis   . Pneumonia    hx of several yrs ago  . Shortness of breath dyspnea    with exertion only  . Sleep apnea    wears CPAP machine nightly    Patient Active Problem List   Diagnosis Date Noted  . AKI (acute kidney injury) ([facility]) 04/27/2017  . Acute metabolic encephalopathy 17/61/6073  . Elevated LFTs 04/27/2017  . Osteopenia 04/01/2017  . Fall 01/06/2017  . Port catheter in place 12/25/2016  . IBS (irritable bowel syndrome)  11/25/2016  . Breast cancer of lower-inner quadrant of right female breast ([facility]) 06/27/2016  . OSA on CPAP 06/26/2016  . Type 2 diabetes mellitus without complication ([facility]) 71/05/2693  . Severe obesity (BMI >= 40) ([facility]) 09/04/2014  . Essential hypertension 09/04/2014  . HLD (hyperlipidemia) 09/04/2014  . Gastroesophageal reflux disease without esophagitis 09/04/2014  . Asthma, mild persistent 09/04/2014  . Urge incontinence 09/04/2014  . Seasonal allergies 09/04/2014  . Generalized anxiety disorder 09/04/2014    Past Surgical History:  Procedure Laterality Date  . BACK SURGERY     lower  . BREAST CYST EXCISION Right 2001   negative  . BREAST LUMPECTOMY WITH NEEDLE LOCALIZATION AND AXILLARY LYMPH NODE DISSECTION Right 07/29/2016   Procedure: RIGHT BREAST LUMPECTOMY WITH DOUBLE NEEDLE LOCALIZATION AND COMPLETE RIGHT AXILLARY LYMPH NODE DISSECTION;  Surgeon: Fanny Skates, MD;  Location: [facility];  Service: General;  Laterality: Right;  . BREAST SURGERY Left 2000   Biopsy  . BUNIONECTOMY Bilateral 1998   great toe fusion on right foot  . COLONOSCOPY W/ POLYPECTOMY    . HERNIA REPAIR  8546   [place] SINUS SURGERY  2015  . PORTACATH PLACEMENT N/A 09/05/2016   Procedure: INSERTION PORT-A-CATH;  Surgeon: Fanny Skates, MD;  Location: [facility];  Service: General;  Laterality: N/A;  . REPLACEMENT TOTAL KNEE Bilateral 2007  .  TOTAL SHOULDER REPLACEMENT Left 2007    OB History    No data available       Home Medications    Prior to Admission medications   Medication Sig Start Date End Date Taking? Authorizing Provider  albuterol-ipratropium (COMBIVENT) 18-103 MCG/ACT inhaler Inhale 2 puffs into the lungs every 4 (four) hours as needed for wheezing or shortness of breath.    Yes [provider]  alendronate (FOSAMAX) 70 MG tablet Take 1 tablet (70 mg total) by mouth once a week. Take with a full glass of water on an empty stomach. 04/01/17  Yes Truitt Merle, MD    ALPRAZolam Duanne Moron) 0.25 MG tablet TAKE ONE TABLET BY MOUTH TWICE DAILY AS NEEDED 11/26/16  Yes [name], NP  aspirin 81 MG tablet Take 81 mg by mouth daily.   Yes [provider]  Calcium Carbonate-Vitamin D (CALCIUM-VITAMIN D) 500-200 MG-UNIT tablet Take 1 tablet by mouth daily.   Yes [provider]  cetirizine (ZYRTEC) 10 MG tablet Take 10 mg by mouth daily.   Yes [provider]  Cholecalciferol (VITAMIN D3) 1000 units CAPS Take by mouth 2 (two) times daily.   Yes [provider]  diphenoxylate-atropine (LOMOTIL) 2.5-0.025 MG tablet Take 2 tablets by mouth 4 (four) times daily as needed for diarrhea or loose stools. 11/25/16  Yes Susanne Borders, NP  EPINEPHrine 0.3 mg/0.3 mL IJ SOAJ injection Inject 0.3 mg into the muscle once as needed (ALLERGIC REACTION).    Yes [provider]  glipiZIDE (GLUCOTROL) 5 MG tablet TAKE ONE TABLET TWICE DAILY BEFORE A MEAL 04/06/17  Yes Baity, Coralie Keens, NP  glucosamine-chondroitin 500-400 MG tablet Take 1 tablet by mouth 2 (two) times daily.   Yes [provider]  letrozole (FEMARA) 2.5 MG tablet Take 1 tablet (2.5 mg total) by mouth daily. 04/01/17  Yes Truitt Merle, MD  lidocaine-prilocaine (EMLA) cream Apply to port at least 1 hour prior to chemo 09/04/16  Yes Truitt Merle, MD  Olmesartan-Amlodipine-HCTZ 40-5-25 MG TABS Take 1 tablet by mouth daily. 12/05/16  Yes Baity, Coralie Keens, NP  ondansetron (ZOFRAN) 8 MG tablet TAKE ONE TABLET BY MOUTH TWICE DAILY AS NEEDED FOR  REFRACTORY  NAUSEA/VOMITING.  START  ON  DAY  3  AFTER  CHEMO. 10/16/16  Yes Truitt Merle, MD  oxybutynin (DITROPAN-XL) 10 MG 24 hr tablet Take 1 tablet (10 mg total) by mouth at bedtime. 12/05/16  Yes Baity, Coralie Keens, NP  potassium chloride (K-DUR) 10 MEQ tablet Take 1 tablet (10 mEq total) by mouth 2 (two) times daily. 03/06/17  Yes [name], NP  pravastatin (PRAVACHOL) 40 MG tablet Take 1 tablet (40 mg total) by mouth daily. 12/05/16  Yes  [name], NP  prochlorperazine (COMPAZINE) 10 MG tablet TAKE ONE TABLET BY MOUTH EVERY 6 HOURS AS NEEDED FOR NAUSEA AND VOMITING 10/16/16  Yes Truitt Merle, MD  ranitidine (ZANTAC) 150 MG tablet Take 150 mg by mouth every evening.    Yes [provider]  sertraline (ZOLOFT) 25 MG tablet TAKE ONE (1) TABLET EACH DAY 02/26/17  Yes Baity, Coralie Keens, NP  vitamin B-12 (CYANOCOBALAMIN) 1000 MCG tablet Take 1,000 mcg by mouth daily.   Yes [provider]  magic mouthwash SOLN Swish and Spit 5-10 mLs 4 times a day as needed. Patient not taking: Reported on 04/01/2017 09/16/16   Owens Shark, NP    Family History Family History  Problem Relation Age of Onset  .  Arthritis Mother   . Stroke Mother   . Hypertension Mother   . Cancer Father     Prostate  . Stroke Father   . Hypertension Father   . Hypertension Maternal Grandmother   . Rheum arthritis Maternal Grandfather   . Stroke Maternal Grandfather   . Hypertension Maternal Grandfather   . Cancer Paternal Grandmother     Colon  . Hypertension Paternal Grandmother   . Hypertension Paternal Grandfather   . Breast cancer Neg Hx     Social History Social History  Substance Use Topics  . Smoking status: Former Smoker    Packs/day: 2.00    Years: 25.00    Quit date: 12/22/1990  . Smokeless tobacco: Never Used     Comment: quit 24 years ago  . Alcohol use Yes     Comment: rare     Allergies   Other   Review of Systems Review of Systems  All other systems reviewed and are negative.    Physical Exam Updated Vital Signs BP 110/60 (BP Location: Left Arm)   Pulse 79   Temp 98.3 F (36.8 C) (Oral)   Resp 20   Ht 5' (1.524 m)   Wt 198 lb (89.8 kg)   SpO2 95%   BMI 38.67 kg/m   Physical Exam  Constitutional: She is oriented to person, place, and time. She appears well-developed and well-nourished.  HENT:  Head: Normocephalic and atraumatic.  Eyes: EOM are normal. Pupils are equal, round, and reactive to  light.  Neck: Neck supple.  Cardiovascular: Normal rate, regular rhythm and normal heart sounds.   No murmur heard. Pulmonary/Chest: Effort normal. No respiratory distress.  Abdominal: Soft. She exhibits no distension. There is no tenderness. There is no rebound and no guarding.  Neurological: She is alert and oriented to person, place, and time. No cranial nerve deficit. Coordination normal.  Pt is having aphasia  Skin: Skin is warm and dry.  Nursing note and vitals reviewed.    ED Treatments / Results  Labs (all labs ordered are listed, but only abnormal results are displayed) Labs Reviewed  CULTURE, BLOOD (ROUTINE X 2) - Abnormal; Notable for the following:       Result Value   Culture   (*)    Value: ESCHERICHIA COLI SUSCEPTIBILITIES TO FOLLOW Performed at [facility], [address], [place], [place] [address]    All other components within normal limits  BLOOD CULTURE ID PANEL (REFLEXED) - Abnormal; Notable for the following:    Enterobacteriaceae species DETECTED (*)    Escherichia coli DETECTED (*)    All other components within normal limits  URINE CULTURE - Abnormal; Notable for the following:    Culture   (*)    Value: >=100,000 COLONIES/mL ESCHERICHIA COLI SUSCEPTIBILITIES TO FOLLOW Performed at [facility] [address] [place], [place] [address]    All other components within normal limits  COMPREHENSIVE METABOLIC PANEL - Abnormal; Notable for the following:    Glucose, Bld 54 (*)    BUN 62 (*)    Creatinine, Ser 1.54 (*)    Total Protein 6.3 (*)    Albumin 3.2 (*)    AST 316 (*)    ALT 625 (*)    Alkaline Phosphatase 258 (*)    Total Bilirubin 3.8 (*)    GFR calc non Af Amer 32 (*)    GFR calc Af Amer 37 (*)    All other components within normal limits  CBC WITH DIFFERENTIAL/PLATELET - Abnormal; Notable for the following:    WBC 14.1 (*)    Hemoglobin 11.1 (*)    HCT 33.5 (*)    Platelets 127 (*)    Neutro Abs 13.7 (*)    Lymphs  Abs 0.3 (*)    All other components within normal limits  PROTIME-INR - Abnormal; Notable for the following:    Prothrombin Time 19.2 (*)    All other components within normal limits  URINALYSIS, ROUTINE W REFLEX MICROSCOPIC - Abnormal; Notable for the following:    Color, Urine AMBER (*)    APPearance CLOUDY (*)    Hgb urine dipstick SMALL (*)    Protein, ur 30 (*)    Bacteria, UA MANY (*)    Squamous Epithelial / LPF 0-5 (*)    All other components within normal limits  BLOOD GAS, VENOUS - Abnormal; Notable for the following:    pH, Ven 7.441 (*)    pCO2, Ven 37.9 (*)    pO2, Ven 49.6 (*)    All other components within normal limits  DIC (DISSEMINATED INTRAVASCULAR COAGULATION) PANEL - Abnormal; Notable for the following:    Prothrombin Time 17.9 (*)    aPTT 42 (*)    Fibrinogen 692 (*)    D-Dimer, Quant 14.78 (*)    Platelets 110 (*)    All other components within normal limits  COMPREHENSIVE METABOLIC PANEL - Abnormal; Notable for the following:    Potassium 3.3 (*)    Glucose, Bld 111 (*)    BUN 59 (*)    Creatinine, Ser 1.01 (*)    Total Protein 5.4 (*)    Albumin 2.4 (*)    AST 118 (*)    ALT 379 (*)    Alkaline Phosphatase 170 (*)    Total Bilirubin 1.4 (*)    GFR calc non Af Amer 54 (*)    All other components within normal limits  CBC - Abnormal; Notable for the following:    WBC 13.9 (*)    RBC 3.73 (*)    Hemoglobin 10.3 (*)    HCT 31.8 (*)    Platelets 89 (*)    All other components within normal limits  VITAMIN B12 - Abnormal; Notable for the following:    Vitamin B-12 1,443 (*)    All other components within normal limits  GLUCOSE, CAPILLARY - Abnormal; Notable for the following:    Glucose-Capillary 47 (*)    All other components within normal limits  GLUCOSE, CAPILLARY - Abnormal; Notable for the following:    Glucose-Capillary 55 (*)    All other components within normal limits  GLUCOSE, CAPILLARY - Abnormal; Notable for the following:     Glucose-Capillary 101 (*)    All other components within normal limits  GLUCOSE, CAPILLARY - Abnormal; Notable for the following:    Glucose-Capillary 125 (*)    All other components within normal limits  GLUCOSE, CAPILLARY - Abnormal; Notable for the following:    Glucose-Capillary 185 (*)    All other components within normal limits  CBC - Abnormal; Notable for the following:    Hemoglobin 11.3 (*)    HCT 33.9 (*)    RDW 15.6 (*)    Platelets 55 (*)    All other components within normal limits  COMPREHENSIVE METABOLIC PANEL - Abnormal; Notable for the following:    Potassium 3.4 (*)    Glucose, Bld 131 (*)    BUN 49 (*)    Total  Protein 5.2 (*)    Albumin 2.2 (*)    AST 42 (*)    ALT 239 (*)    Alkaline Phosphatase 227 (*)    All other components within normal limits  GLUCOSE, CAPILLARY - Abnormal; Notable for the following:    Glucose-Capillary 188 (*)    All other components within normal limits  GLUCOSE, CAPILLARY - Abnormal; Notable for the following:    Glucose-Capillary 112 (*)    All other components within normal limits  GLUCOSE, CAPILLARY - Abnormal; Notable for the following:    Glucose-Capillary 153 (*)    All other components within normal limits  GLUCOSE, CAPILLARY - Abnormal; Notable for the following:    Glucose-Capillary 127 (*)    All other components within normal limits  I-STAT CG4 LACTIC ACID, ED - Abnormal; Notable for the following:    Lactic Acid, Venous 1.92 (*)    All other components within normal limits  CBG MONITORING, ED - Abnormal; Notable for the following:    Glucose-Capillary 101 (*)    All other components within normal limits  CULTURE, BLOOD (ROUTINE X 2)  CK  AMMONIA  ACETAMINOPHEN LEVEL  PROTIME-INR  HEPATITIS PANEL, ACUTE  TSH  ACETAMINOPHEN LEVEL  GLUCOSE, CAPILLARY  GLUCOSE, CAPILLARY  VITAMIN B1  CBC WITH DIFFERENTIAL/PLATELET  BASIC METABOLIC PANEL  HEPATIC FUNCTION PANEL  I-STAT CG4 LACTIC ACID, ED    EKG  EKG  Interpretation None       Radiology Nm Hepato W/eject Fract  Result Date: 04/28/2017 CLINICAL DATA:  76 year old female with possible cholecystitis, nausea and vomiting EXAM: NUCLEAR MEDICINE HEPATOBILIARY IMAGING WITH GALLBLADDER EF TECHNIQUE: Sequential images of the abdomen were obtained out to 60 minutes following intravenous administration of radiopharmaceutical. After oral ingestion of Ensure, gallbladder ejection fraction was determined. At 60 min, normal ejection fraction is greater than 33%. RADIOPHARMACEUTICALS:  5.0 mCi Tc-89m  Choletec IV COMPARISON:  Ultrasound 04/27/2017 FINDINGS: Insert HIDA Planar images of the upper abdomen performed for 1 hour after administration of radiopharmaceutical targeting the hepatic biliary system. Uniform uptake within the liver is observed. Excretion into the central biliary ducts is identified at 15 minutes There is antegrade flow of radiotracer within the extrahepatic biliary ducts identified at 20 minutes, with antegrade flow into the GI system. No evidence of radiopharmaceutical accumulation outside of the biliary system or the GI system. Calculated gallbladder ejection fraction is 76.7%. (Normal gallbladder ejection fraction with Ensure is greater than 33%.) IMPRESSION: Hepatobiliary nuclear medicine study demonstrates patent extrahepatic biliary ductal system, with normal ejection fraction of the gallbladder. Electronically Signed   By: Corrie Mckusick D.O.   On: 04/28/2017 13:41    Procedures Procedures (including critical care time)  CRITICAL CARE Performed by: Varney Biles   Total critical care time: 48 minutes  Critical care time was exclusive of separately billable procedures and treating other patients.  Critical care was necessary to treat or prevent imminent or life-threatening deterioration.  Critical care was time spent personally by me on the following activities: development of treatment plan with patient and/or surrogate as  well as nursing, discussions with consultants, evaluation of patient's response to treatment, examination of patient, obtaining history from patient or surrogate, ordering and performing treatments and interventions, ordering and review of laboratory studies, ordering and review of radiographic studies, pulse oximetry and re-evaluation of patient's condition.   Medications Ordered in ED Medications  aspirin chewable tablet 81 mg (81 mg Oral Given 04/29/17 1115)  sertraline (ZOLOFT) tablet 25 mg (25  mg Oral Given 04/29/17 2150)  oxybutynin (DITROPAN-XL) 24 hr tablet 10 mg (10 mg Oral Given 04/29/17 2150)  diphenoxylate-atropine (LOMOTIL) 2.5-0.025 MG per tablet 2 tablet (not administered)  ALPRAZolam (XANAX) tablet 0.25 mg (0.25 mg Oral Given 04/29/17 2155)  ondansetron (ZOFRAN) tablet 4 mg (not administered)    Or  ondansetron (ZOFRAN) injection 4 mg (not administered)  insulin aspart (novoLOG) injection 0-9 Units (0 Units Subcutaneous Not Given 04/29/17 1747)  cefTRIAXone (ROCEPHIN) 2 g in dextrose 5 % 50 mL IVPB (0 g Intravenous Stopped 04/29/17 1832)  ipratropium-albuterol (DUONEB) 0.5-2.5 (3) MG/3ML nebulizer solution 3 mL (not administered)  dextrose 5 %-0.45 % sodium chloride infusion ( Intravenous Rate/Dose Change 04/29/17 1205)  protein supplement (PREMIER PROTEIN) liquid (11 oz Oral Given 04/29/17 1447)  dextrose 50 % solution 50 mL (50 mLs Intravenous Given 04/27/17 1405)  lactated ringers bolus 2,000 mL (2,000 mLs Intravenous New Bag/Given 04/27/17 1617)  technetium TC 60M mebrofenin (CHOLETEC) injection 5 millicurie (5 millicuries Intravenous Contrast Given 04/28/17 1103)  sodium chloride 0.9 % bolus 500 mL (500 mLs Intravenous New Bag/Given 04/28/17 1540)  HYDROcodone-acetaminophen (NORCO/VICODIN) 5-325 MG per tablet 2 tablet (2 tablets Oral Given 04/29/17 0300)     Initial Impression / Assessment and Plan / ED Course  I have reviewed the triage vital signs and the nursing notes.  Pertinent labs &  imaging results that were available during my care of the patient were reviewed by me and considered in my medical decision making (see chart for details).     Pt comes in with cc of confusion, particularly she has aphasia. Pt has hx of cancer, not on anticoagulation. Pt is following all commands, there is no evidence of infection on exam, no fevers, tachycardia, tachypnea. WC is 13.9.  Initial impression is stroke, metastatic mass or brain bleed. Ct head neg - so plan is to get MRI.  Labs also show elevated LFTs and bilirubin. She has cancer hx again - we will get [place]. There is also thrombocytopenia. With the AMS and low platelets, I will add DIC panel, TTP is in the ddx. Oncology team might need to be involved. We will also send blood and urine cultures.  We will admit to medicine for what appears to be an evolving case at this time.  Results from the ER workup discussed with the patient's family face to face and all questions answered to the best of my ability.    Final Clinical Impressions(s) / ED Diagnoses   Final diagnoses:  Elevated LFTs  Aphasia  Hypoglycemia  AKI (acute kidney injury) [facility])    New Prescriptions Current Discharge Medication List       Varney Biles, MD 04/29/17 2334

## 2017-04-29 NOTE — Progress Notes (Signed)
Offsite EEG completed at [facility], results pending. 

## 2017-04-29 NOTE — Progress Notes (Addendum)
Nutrition Brief Follow-up  Patient in room with daughter at bedside. Pt sleeping during visit. Pt's daughter states that the pt has been sleeping ever since receiving  Pain medications yesterday. RD placed order for breakfast in case patient were to wake up.   Nutrition focused physical exam shows no sign of depletion of muscle mass or body fat.  RD will continue to monitor PO intakes and needs.   Clayton Bibles, MS, RD, LDN Pager: [phone] After Hours Pager: [phone]

## 2017-04-30 ENCOUNTER — Inpatient Hospital Stay (HOSPITAL_COMMUNITY): Payer: Medicare PPO

## 2017-04-30 DIAGNOSIS — E119 Type 2 diabetes mellitus without complications: Secondary | ICD-10-CM

## 2017-04-30 DIAGNOSIS — N390 Urinary tract infection, site not specified: Secondary | ICD-10-CM

## 2017-04-30 DIAGNOSIS — R7989 Other specified abnormal findings of blood chemistry: Secondary | ICD-10-CM

## 2017-04-30 DIAGNOSIS — D696 Thrombocytopenia, unspecified: Secondary | ICD-10-CM

## 2017-04-30 DIAGNOSIS — A419 Sepsis, unspecified organism: Secondary | ICD-10-CM

## 2017-04-30 LAB — BASIC METABOLIC PANEL
Anion gap: 7 (ref 5–15)
BUN: 40 mg/dL — AB (ref 6–20)
CO2: 25 mmol/L (ref 22–32)
CREATININE: 0.68 mg/dL (ref 0.44–1.00)
Calcium: 9 mg/dL (ref 8.9–10.3)
Chloride: 103 mmol/L (ref 101–111)
GFR calc Af Amer: >60 mL/min (ref >60)
GLUCOSE: 133 mg/dL — AB (ref 65–99)
Potassium: 3.5 mmol/L (ref 3.5–5.1)
SODIUM: 135 mmol/L (ref 135–145)

## 2017-04-30 LAB — CBC WITH DIFFERENTIAL/PLATELET
Basophils Absolute: 0 10*3/uL (ref 0.0–0.1)
Basophils Relative: 0 %
EOS ABS: 0.1 10*3/uL (ref 0.0–0.7)
EOS PCT: 1 %
HCT: 33.1 % — ABNORMAL LOW (ref 36.0–46.0)
Hemoglobin: 10.9 g/dL — ABNORMAL LOW (ref 12.0–15.0)
LYMPHS ABS: 0.9 10*3/uL (ref 0.7–4.0)
LYMPHS PCT: 11 %
MCH: 27.7 pg (ref 26.0–34.0)
MCHC: 32.9 g/dL (ref 30.0–36.0)
MCV: 84 fL (ref 78.0–100.0)
MONO ABS: 1.3 10*3/uL — AB (ref 0.1–1.0)
MONOS PCT: 16 %
Neutro Abs: 5.5 10*3/uL (ref 1.7–7.7)
Neutrophils Relative %: 72 %
PLATELETS: 45 10*3/uL — AB (ref 150–400)
RBC: 3.94 MIL/uL (ref 3.87–5.11)
RDW: 15.8 % — AB (ref 11.5–15.5)
WBC: 7.7 10*3/uL (ref 4.0–10.5)

## 2017-04-30 LAB — HEPATIC FUNCTION PANEL
ALT: 156 U/L — AB (ref 14–54)
AST: 28 U/L (ref 15–41)
Albumin: 2.2 g/dL — ABNORMAL LOW (ref 3.5–5.0)
Alkaline Phosphatase: 202 U/L — ABNORMAL HIGH (ref 38–126)
BILIRUBIN DIRECT: 0.3 mg/dL (ref 0.1–0.5)
BILIRUBIN INDIRECT: 0.4 mg/dL (ref 0.3–0.9)
Total Bilirubin: 0.7 mg/dL (ref 0.3–1.2)
Total Protein: 5.3 g/dL — ABNORMAL LOW (ref 6.5–8.1)

## 2017-04-30 LAB — GLUCOSE, CAPILLARY
GLUCOSE-CAPILLARY: 153 mg/dL — AB (ref 65–99)
Glucose-Capillary: 158 mg/dL — ABNORMAL HIGH (ref 65–99)

## 2017-04-30 LAB — CULTURE, BLOOD (ROUTINE X 2): SPECIAL REQUESTS: ADEQUATE

## 2017-04-30 LAB — URINE CULTURE: Culture: >=100000 — AB

## 2017-04-30 MED ORDER — DIPHENHYDRAMINE HCL 25 MG PO CAPS: 25.0000 mg | ORAL_CAPSULE | Freq: Every evening | ORAL | Status: DC | PRN

## 2017-04-30 MED ORDER — IBUPROFEN 200 MG PO TABS: 400.0000 mg | ORAL_TABLET | Freq: Once | ORAL | Status: DC

## 2017-04-30 MED ORDER — CEPHALEXIN 500 MG PO CAPS: 500.0000 mg | ORAL_CAPSULE | Freq: Two times a day (BID) | ORAL | 0 refills | Status: AC

## 2017-04-30 MED ORDER — IBUPROFEN 200 MG PO TABS
400.0000 mg | ORAL_TABLET | Freq: Once | ORAL | Status: AC
  Administered 2017-04-30: 400 mg via ORAL
  Filled 2017-04-30: qty 2

## 2017-04-30 NOTE — Progress Notes (Signed)
VASCULAR LAB PRELIMINARY  PRELIMINARY  PRELIMINARY  PRELIMINARY  Bilateral lower extremity venous duplex completed.    Preliminary report:  Bilateral:  No evidence of DVT, superficial thrombosis, or Baker's Cyst.   Anirudh Baiz, RVS 04/30/2017, 12:02 PM

## 2017-04-30 NOTE — Care Management Note (Signed)
Case Management Note  Patient Details  Name: Tiffany Arnold MRN: [id number] Date of Birth: 1941-07-19  Subjective/Objective:       76 yo admitted with Breast Cancer.           Action/Plan: From home with spouse. Pt has RW at home currently. Physical therapy recommendations gone over with pt and daughter at bedside. Choice offered for home health services and [facility] chosen. [facility] rep contacted for referral. No other CM needs communicated.  Expected Discharge Date:  04/30/17               Expected Discharge Plan:  [place]  In-House Referral:     Discharge planning Services  CM Consult  Post Acute Care Choice:  Home Health Choice offered to:  Patient, Adult Children  DME Arranged:    DME Agency:     HH Arranged:  PT, RN [place] Agency:  [facility]  Status of Service:  Completed, signed off  If discussed at [place] of Stay Meetings, dates discussed:    Additional CommentsLynnell Catalan, RN 04/30/2017, 1:34 PM  [phone]

## 2017-04-30 NOTE — Discharge Summary (Addendum)
Physician Discharge Summary  ADRIENE KNIPFER HBZ:[id number] DOB: [birth year] DOA: 04/27/2017  PCP: [name], NP  Admit date: 04/27/2017 Discharge date: 04/30/2017  Admitted From: Home  Disposition:  Home   Recommendations for Outpatient Follow-up:  1. Follow up with PCP in 1-week 2. Patient placed on cephalexin for 7 more days 3. Urine and blood cultures positive for E coli.  4. Pease check CBC to follow on platelets on 05/01/17  Home Health: Yes  Equipment/Devices: No   Discharge Condition: Stable  CODE STATUS: Full  Diet recommendation: Heart Healthy / Carb Modified    Brief/Interim Summary: 76 yo female presents with altered mental status. Patient known to have breast cancer diagnoses 2017, sp chemotherapy and radiation therapy, completed 01/2016. Her family noted her to be confused. On the initial examination, blood pressure was low at 72/40, she was awake and alert, non focal. Sodium 136, potassium 4.1, chloride 103, bicarbonate 23, glucose 54, BUN 62, creatinine 1.54, alkaline phosphatase 258, albumin 3.2, AST 316, ALT 625, total bilirubin is 3.8. White count is 14.1, hemoglobin 11.1, hematocrit 33.5, platelets 127. INR 1.59, d-dimer 14.7, fibrinogen 692. Urinalysis with 6-30 white cells. Head CT and MRI were negative for acute abnormalities. Chest x-ray was negative for infiltrates.  The patient was admitted to hospital with working diagnosis acute metabolic encephalopathy complicated with sepsis, elevated liver enzymes (acute liver injury) and acute kidney injury.  1. Acute metabolic encephalopathy. Patient was admitted to the medical floor, she received supportive medical therapy, IV fluids and routine neurochecks. Patient was diagnosed with urinary tract infection, antibiotics were started. Her condition improved, she was able to return back to her baseline. She will have home health and home physical therapy.  2. Urinary tract infection with gram-negative bacteremia  (Escherichia coli) complicated by sepsis, present on admission. Initially patient placed on ceftriaxone intravenously, blood cultures and urine culture were positive for Escherichia coli. Sensitivities positive for cephalosporins with resistance to ampicillin, sulbactam, gentamicin, Bactrim. Patient responded well to antibiotic therapy, she will be transitioned to cephalexin oral for the next 7 days  3. Acute liver injury, liver shock. Patient was placed on IV fluids, hypotension was corrected, close monitoring of liver function testing, with improvement of LFTs and bilirubins. It was presumed that abnormality liver function was deemed to hypoperfusion related to sepsis. Liver Doppler analysis was negative for thrombosis, ultrasonography showed a large solitary gallstone within the mid gallbladder with no pericholecystic fluid or sonographic Murphy sign. HIDA scan was negative.   4. Acute kidney injury. Peak creatinine 1.54, patient received intravenous fluids with good toleration, discharge creatinine 0.68, serum bicarbonate 25. Patient tolerated well po diet.   5. Type 2 diabetes mellitus. Patient was placed on insulin sliding scale for glucose coverage and monitoring, capillary glucose over last 24 hours, 112, 153, 91, 127, 153. Patient will resume glipizide by the time of discharge.   6. Hypertension. Blood pressure remained controlled during her hospitalization, antihypertensive agents were held. Patient will resume Olmesartan- Amlodipine, when systolic blood pressure reaches 140 mmHg.   7. History of Breast cancer. Plan to follow-up as an outpatient.  8. Depression. Continue sertraline.  9. Elevated d-dimer. Very likely to be related to gram-negative sepsis, will follow ultrasonography of the lower extremities, before discharge.   10. Thrombocytopenia. Presumed to be reactive, discharge platelets 45,000, will hold on aspirin and will recommend to check cell count within next 24 hours, family  has been informed about these findings.   Discharge Diagnoses:  Active Problems:  Severe obesity (BMI >= 40) (HCC)   Essential hypertension   HLD (hyperlipidemia)   Type 2 diabetes mellitus without complication (HCC)   OSA on CPAP   Breast cancer of lower-inner quadrant of right female breast (HCC)   IBS (irritable bowel syndrome)   AKI (acute kidney injury) ([place])   Acute metabolic encephalopathy   Elevated LFTs    Discharge Instructions   Allergies as of 04/30/2017      Reactions   Other Swelling, Shortness Of Breath   Cats, dogs, mold Induced asthma Cats, dogs, mold      Medication List    STOP taking these medications   aspirin 81 MG tablet   magic mouthwash Soln     TAKE these medications   albuterol-ipratropium 18-103 MCG/ACT inhaler Commonly known as:  COMBIVENT Inhale 2 puffs into the lungs every 4 (four) hours as needed for wheezing or shortness of breath.   alendronate 70 MG tablet Commonly known as:  FOSAMAX Take 1 tablet (70 mg total) by mouth once a week. Take with a full glass of water on an empty stomach.   ALPRAZolam 0.25 MG tablet Commonly known as:  XANAX TAKE ONE TABLET BY MOUTH TWICE DAILY AS NEEDED   calcium-vitamin D 500-200 MG-UNIT tablet Take 1 tablet by mouth daily.   cephALEXin 500 MG capsule Commonly known as:  KEFLEX Take 1 capsule (500 mg total) by mouth 2 (two) times daily.   cetirizine 10 MG tablet Commonly known as:  ZYRTEC Take 10 mg by mouth daily.   diphenoxylate-atropine 2.5-0.025 MG tablet Commonly known as:  LOMOTIL Take 2 tablets by mouth 4 (four) times daily as needed for diarrhea or loose stools.   EPINEPHrine 0.3 mg/0.3 mL Soaj injection Commonly known as:  EPI-PEN Inject 0.3 mg into the muscle once as needed (ALLERGIC REACTION).   glipiZIDE 5 MG tablet Commonly known as:  GLUCOTROL TAKE ONE TABLET TWICE DAILY BEFORE A MEAL   glucosamine-chondroitin 500-400 MG tablet Take 1 tablet by mouth 2 (two) times  daily.   letrozole 2.5 MG tablet Commonly known as:  FEMARA Take 1 tablet (2.5 mg total) by mouth daily.   lidocaine-prilocaine cream Commonly known as:  EMLA Apply to port at least 1 hour prior to chemo   Olmesartan-Amlodipine-HCTZ 40-5-25 MG Tabs Take 1 tablet by mouth daily.   ondansetron 8 MG tablet Commonly known as:  ZOFRAN TAKE ONE TABLET BY MOUTH TWICE DAILY AS NEEDED FOR  REFRACTORY  NAUSEA/VOMITING.  START  ON  DAY  3  AFTER  CHEMO.   oxybutynin 10 MG 24 hr tablet Commonly known as:  DITROPAN-XL Take 1 tablet (10 mg total) by mouth at bedtime.   potassium chloride 10 MEQ tablet Commonly known as:  K-DUR Take 1 tablet (10 mEq total) by mouth 2 (two) times daily.   pravastatin 40 MG tablet Commonly known as:  PRAVACHOL Take 1 tablet (40 mg total) by mouth daily.   prochlorperazine 10 MG tablet Commonly known as:  COMPAZINE TAKE ONE TABLET BY MOUTH EVERY 6 HOURS AS NEEDED FOR NAUSEA AND VOMITING   ranitidine 150 MG tablet Commonly known as:  ZANTAC Take 150 mg by mouth every evening.   sertraline 25 MG tablet Commonly known as:  ZOLOFT TAKE ONE (1) TABLET EACH DAY   vitamin B-12 1000 MCG tablet Commonly known as:  CYANOCOBALAMIN Take 1,000 mcg by mouth daily.   Vitamin D3 1000 units Caps Take by mouth 2 (two) times daily.  Allergies  Allergen Reactions  . Other Swelling and Shortness Of Breath    Cats, dogs, mold Induced asthma Cats, dogs, mold    Consultations:     Procedures/Studies: Dg Chest 2 View  Result Date: 04/27/2017 CLINICAL DATA:  Pt receiving hormone replacement therapy breast cancer. Pt verbalizes right breast pain and n/v/d. EXAM: CHEST  2 VIEW COMPARISON:  None. FINDINGS: There is bilateral mild interstitial thickening. There is no focal parenchymal opacity. There is no pleural effusion or pneumothorax. There is stable cardiomegaly. 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 There are surgical clips in the right  axilla from prior axillary dissection. IMPRESSION: 1. Cardiomegaly with mild pulmonary vascular congestion. Electronically Signed   By: Kathreen Devoid   On: 04/27/2017 12:17   Ct Head Wo Contrast  Result Date: 04/27/2017 CLINICAL DATA:  Aphasia EXAM: CT HEAD WITHOUT CONTRAST TECHNIQUE: Contiguous axial images were obtained from the base of the skull through the vertex without intravenous contrast. COMPARISON:  None. FINDINGS: Brain: No acute intracranial abnormality. Specifically, no hemorrhage, hydrocephalus, mass lesion, acute infarction, or significant intracranial injury. Patchy scattered small vessel disease throughout the deep white matter. Vascular: No hyperdense vessel or unexpected calcification. Skull: No acute calvarial abnormality. Sinuses/Orbits: Visualized paranasal sinuses and mastoids clear. Orbital soft tissues unremarkable. 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 No acute intracranial abnormality. Electronically Signed   By: Rolm Baptise M.D.   On: 04/27/2017 12:17   Mr Brain Wo Contrast  Result Date: 04/27/2017 CLINICAL DATA:  Aphasia.  History of breast cancer. EXAM: MRI HEAD WITHOUT CONTRAST TECHNIQUE: Multiplanar, multiecho pulse sequences of the brain and surrounding structures were obtained without intravenous contrast. COMPARISON:  CT head 04/27/2016 FINDINGS: Brain: Image quality degraded by motion. Patchy white matter hyperintensities bilaterally. Hyperintensity in the pons. This is most likely related to chronic microvascular ischemia. Negative for acute infarct.  Negative for hemorrhage or mass. Vascular: Normal arterial flow voids. Fetal origin of the posterior cerebral artery bilaterally. Hypoplastic basilar artery. Skull and upper cervical spine: Negative Sinuses/Orbits: Mild mucosal edema paranasal sinuses. Left cataract removal. Other: None IMPRESSION: Chronic microvascular ischemic change.  No acute  infarct. Negative for mass or metastatic disease on unenhanced imaging. Electronically Signed   By: Franchot Gallo M.D.   On: 04/27/2017 15:23   Nm Hepato W/eject Fract  Result Date: 04/28/2017 CLINICAL DATA:  76 year old female with possible cholecystitis, nausea and vomiting EXAM: NUCLEAR MEDICINE HEPATOBILIARY IMAGING WITH GALLBLADDER EF TECHNIQUE: Sequential images of the abdomen were obtained out to 60 minutes following intravenous administration of radiopharmaceutical. After oral ingestion of Ensure, gallbladder ejection fraction was determined. At 60 min, normal ejection fraction is greater than 33%. RADIOPHARMACEUTICALS:  5.0 mCi Tc-58m  Choletec IV COMPARISON:  Ultrasound 04/27/2017 FINDINGS: Insert HIDA Planar images of the upper abdomen performed for 1 hour after administration of radiopharmaceutical targeting the hepatic biliary system. Uniform uptake within the liver is observed. Excretion into the central biliary ducts is identified at 15 minutes There is antegrade flow of radiotracer within the extrahepatic biliary ducts identified at 20 minutes, with antegrade flow into the GI system. No evidence of radiopharmaceutical accumulation outside of the biliary system or the GI system. Calculated gallbladder ejection fraction is 76.7%. (Normal gallbladder ejection fraction with Ensure is greater than 33%.) IMPRESSION: Hepatobiliary nuclear medicine study demonstrates patent extrahepatic biliary ductal system, with normal ejection fraction of the gallbladder. Electronically Signed   By: Corrie Mckusick D.O.  On: 04/28/2017 13:41   US Abdomen Limited  Result Date: 04/27/2017 CLINICAL DATA:  Elevated liver function tests. EXAM: US ABDOMEN LIMITED - RIGHT UPPER QUADRANT COMPARISON:  None. FINDINGS: Gallbladder: Solitary gallstone measuring 3.5 cm is noted. Mild gallbladder wall thickening measured at 8 mm is noted. No pericholecystic fluid or sonographic Murphy's sign is noted. Common bile duct: Diameter:  3.5 mm which is within normal limits. Liver: No focal lesion identified. Within normal limits in parenchymal echogenicity. IMPRESSION: Large solitary gallstone is noted with mild gallbladder wall thickening, but no pericholecystic fluid or sonographic Murphy's sign. If there is clinical concern for cholecystitis, HIDA scan is recommended for further evaluation. Electronically Signed   By: Marijo Conception, M.D.   On: 04/27/2017 16:41   [place] Art/ven Flow Abd Pelv Doppler  Result Date: 04/27/2017 CLINICAL DATA:  Elevated LFTs. EXAM: DUPLEX ULTRASOUND OF LIVER TECHNIQUE: Color and duplex Doppler ultrasound was performed to evaluate the hepatic in-flow and out-flow vessels. COMPARISON:  Right upper quadrant ultrasound 04/27/2017 FINDINGS: Portal Vein Velocities Main:  28 cm/sec Right:  28 cm/sec Left:  18 cm/sec Hepatic Vein Velocities Right:  46 cm/sec Middle:  46 cm/sec Left:  16 cm/sec Hepatic Artery Velocity:  52 cm/sec Splenic Vein Velocity:  27 cm/sec Varices: Absent Ascites: Absent Normal hepatopetal flow in the portal veins. Main portal vein measures up to 1.2 cm. No evidence for portal vein thrombus. Normal hepatofugal flow in the hepatic veins. Normal appearance of the spleen with a calculated splenic volume of 199 mL. IVC is patent. IMPRESSION: Patent portal venous system with normal direction of flow. Patent hepatic veins. Electronically Signed   By: Markus Daft M.D.   On: 04/27/2017 16:58       Subjective: Patient feeling better, out of bed to chair. Had back pain last night, no further confusion or agitation. Tolerating po well.   Discharge Exam: Vitals:   04/29/17 2124 04/30/17 0515  BP: 110/60 (!) 116/57  Pulse: 79 93  Resp: 20 20  Temp: 98.3 F (36.8 C) 99.5 F (37.5 C)   Vitals:   04/29/17 0443 04/29/17 1509 04/29/17 2124 04/30/17 0515  BP: 91/63 (!) 105/56 110/60 (!) 116/57  Pulse: 83 67 79 93  Resp: 20 20 20 20   Temp: 98.1 F (36.7 C) 97.8 F (36.6 C) 98.3 F (36.8 C) 99.5  F (37.5 C)  TempSrc: Oral Oral Oral Oral  SpO2: 90% 93% 95% 95%  Weight:      Height:        General: Pt is alert, awake, not in acute distress Cardiovascular: RRR, S1/S2 +, no rubs, no gallops Respiratory: CTA bilaterally, no wheezing, no rhonchi Abdominal: Soft, NT, ND, bowel sounds + Extremities: no edema, no cyanosis    The results of significant diagnostics from this hospitalization (including imaging, microbiology, ancillary and laboratory) are listed below for reference.     Microbiology: Recent Results (from the past 240 hour(s))  Culture, blood (Routine x 2)     Status: None (Preliminary result)   Collection Time: 04/27/17 11:50 AM  Result Value Ref Range Status   Specimen Description BLOOD LEFT ARM  Final   Special Requests IN PEDIATRIC BOTTLE BCAV  Final   Culture   Final    NO GROWTH 1 DAY Performed at [facility], [address] [place], [place] [address]    Report Status PENDING  Incomplete  Culture, blood (Routine x 2)     Status: Abnormal (Preliminary result)   Collection  Time: 04/27/17 11:53 AM  Result Value Ref Range Status   Specimen Description BLOOD LEFT ANTECUBITAL  Final   Special Requests   Final    BOTTLES DRAWN AEROBIC AND ANAEROBIC Blood Culture adequate volume   Culture  Setup Time   Final    IN BOTH AEROBIC AND ANAEROBIC BOTTLES GRAM NEGATIVE RODS CRITICAL RESULT CALLED TO, READ BACK BY AND VERIFIED WITH: TO JGRIMSELY(PHARMd) BY TCLEVELAND 04/28/2017 AT 4:25AM    Culture (A)  Final    ESCHERICHIA COLI SUSCEPTIBILITIES TO FOLLOW Performed at [facility], [place] [address] [place], [place] [address]    Report Status PENDING  Incomplete  Blood Culture ID Panel (Reflexed)     Status: Abnormal   Collection Time: 04/27/17 11:53 AM  Result Value Ref Range Status   Enterococcus species NOT DETECTED NOT DETECTED Final   Listeria monocytogenes NOT DETECTED NOT DETECTED Final   Staphylococcus species NOT DETECTED NOT DETECTED Final    Staphylococcus aureus NOT DETECTED NOT DETECTED Final   Streptococcus species NOT DETECTED NOT DETECTED Final   Streptococcus agalactiae NOT DETECTED NOT DETECTED Final   Streptococcus pneumoniae NOT DETECTED NOT DETECTED Final   Streptococcus pyogenes NOT DETECTED NOT DETECTED Final   Acinetobacter baumannii NOT DETECTED NOT DETECTED Final   Enterobacteriaceae species DETECTED (A) NOT DETECTED Final    Comment: Enterobacteriaceae represent a large family of gram-negative bacteria, not a single organism. CRITICAL RESULT CALLED TO, READ BACK BY AND VERIFIED WITH: TO JGRIMSELY(PHARMd) BY TCLEVELAND 04/28/2017 AT 4:25AM    Enterobacter cloacae complex NOT DETECTED NOT DETECTED Final   Escherichia coli DETECTED (A) NOT DETECTED Final    Comment: CRITICAL RESULT CALLED TO, READ BACK BY AND VERIFIED WITH: TO JGRIMSELY(PHARMd) BY TCLEVELAND 04/28/2017 AT 4:25AM    Klebsiella oxytoca NOT DETECTED NOT DETECTED Final   Klebsiella pneumoniae NOT DETECTED NOT DETECTED Final   Proteus species NOT DETECTED NOT DETECTED Final   Serratia marcescens NOT DETECTED NOT DETECTED Final   Carbapenem resistance NOT DETECTED NOT DETECTED Final   Haemophilus influenzae NOT DETECTED NOT DETECTED Final   Neisseria meningitidis NOT DETECTED NOT DETECTED Final   Pseudomonas aeruginosa NOT DETECTED NOT DETECTED Final   Candida albicans NOT DETECTED NOT DETECTED Final   Candida glabrata NOT DETECTED NOT DETECTED Final   Candida krusei NOT DETECTED NOT DETECTED Final   Candida parapsilosis NOT DETECTED NOT DETECTED Final   Candida tropicalis NOT DETECTED NOT DETECTED Final    Comment: Performed at [facility], [place] [address], [place], [place] [address]  Culture, Urine     Status: Abnormal   Collection Time: 04/27/17  4:40 PM  Result Value Ref Range Status   Specimen Description URINE, CATHETERIZED  Final   Special Requests NONE  Final   Culture >=100,000 COLONIES/mL ESCHERICHIA COLI (A)  Final   Report  Status 04/30/2017 FINAL  Final   Organism ID, Bacteria ESCHERICHIA COLI (A)  Final      Susceptibility   Escherichia coli - MIC*    AMPICILLIN >=32 RESISTANT Resistant     CEFAZOLIN <=4 SENSITIVE Sensitive     CEFTRIAXONE <=1 SENSITIVE Sensitive     CIPROFLOXACIN <=0.25 SENSITIVE Sensitive     GENTAMICIN >=16 RESISTANT Resistant     IMIPENEM <=0.25 SENSITIVE Sensitive     NITROFURANTOIN <=16 SENSITIVE Sensitive     TRIMETH/SULFA >=320 RESISTANT Resistant     AMPICILLIN/SULBACTAM >=32 RESISTANT Resistant     PIP/TAZO <=4 SENSITIVE Sensitive     Extended  ESBL NEGATIVE Sensitive     * >=100,000 COLONIES/mL ESCHERICHIA COLI     Labs: BNP (last 3 results) No results for input(s): BNP in the last 8760 hours. Basic Metabolic Panel:  Recent Labs Lab 04/27/17 1153 04/28/17 0411 04/29/17 0402 04/30/17 0353  NA 136 137 138 135  K 4.1 3.3* 3.4* 3.5  CL 103 105 107 103  CO2 23 25 23 25   GLUCOSE 54* 111* 131* 133*  BUN 62* 59* 49* 40*  CREATININE 1.54* 1.01* 0.73 0.68  CALCIUM 9.4 9.3 9.1 9.0   Liver Function Tests:  Recent Labs Lab 04/27/17 1153 04/28/17 0411 04/29/17 0402 04/30/17 0353  AST 316* 118* 42* 28  ALT 625* 379* 239* 156*  ALKPHOS 258* 170* 227* 202*  BILITOT 3.8* 1.4* 1.1 0.7  PROT 6.3* 5.4* 5.2* 5.3*  ALBUMIN 3.2* 2.4* 2.2* 2.2*   No results for input(s): LIPASE, AMYLASE in the last 168 hours.  Recent Labs Lab 04/27/17 1628  AMMONIA 29   CBC:  Recent Labs Lab 04/27/17 1153 04/27/17 1626 04/28/17 0411 04/29/17 0402 04/30/17 0353  WBC 14.1*  --  13.9* 7.3 7.7  NEUTROABS 13.7*  --   --   --  5.5  HGB 11.1*  --  10.3* 11.3* 10.9*  HCT 33.5*  --  31.8* 33.9* 33.1*  MCV 85.5  --  85.3 84.1 84.0  PLT 127* 110* 89* 55* 45*   Cardiac Enzymes:  Recent Labs Lab 04/27/17 1322  CKTOTAL 42   BNP: Invalid input(s): POCBNP CBG:  Recent Labs Lab 04/29/17 0745 04/29/17 1228 04/29/17 1735 04/29/17 2123 04/30/17 0753  GLUCAP 112* 153* 91 127*  153*   D-Dimer  Recent Labs  04/27/17 1626  DDIMER 14.78*   Hgb A1c No results for input(s): HGBA1C in the last 72 hours. Lipid Profile No results for input(s): CHOL, HDL, LDLCALC, TRIG, CHOLHDL, LDLDIRECT in the last 72 hours. Thyroid function studies  Recent Labs  04/27/17 1823  TSH 0.656   Anemia work up  Recent Labs  04/27/17 1823  VITAMINB12 1,443*   Urinalysis    Component Value Date/Time   COLORURINE AMBER (A) 04/27/2017 1150   APPEARANCEUR CLOUDY (A) 04/27/2017 1150   APPEARANCEUR Turbid 05/15/2012 1657   LABSPEC 1.020 04/27/2017 1150   LABSPEC 1.023 05/15/2012 1657   PHURINE 5.0 04/27/2017 1150   GLUCOSEU NEGATIVE 04/27/2017 1150   GLUCOSEU Negative 05/15/2012 1657   HGBUR SMALL (A) 04/27/2017 1150   BILIRUBINUR NEGATIVE 04/27/2017 1150   BILIRUBINUR Negative 05/15/2012 1657   KETONESUR NEGATIVE 04/27/2017 1150   PROTEINUR 30 (A) 04/27/2017 1150   NITRITE NEGATIVE 04/27/2017 1150   LEUKOCYTESUR NEGATIVE 04/27/2017 1150   LEUKOCYTESUR 2+ 05/15/2012 1657   Sepsis Labs Invalid input(s): PROCALCITONIN,  WBC,  LACTICIDVEN Microbiology Recent Results (from the past 240 hour(s))  Culture, blood (Routine x 2)     Status: None (Preliminary result)   Collection Time: 04/27/17 11:50 AM  Result Value Ref Range Status   Specimen Description BLOOD LEFT ARM  Final   Special Requests IN PEDIATRIC BOTTLE BCAV  Final   Culture   Final    NO GROWTH 1 DAY Performed at [facility], [place] [address] [place], [place] [address]    Report Status PENDING  Incomplete  Culture, blood (Routine x 2)     Status: Abnormal (Preliminary result)   Collection Time: 04/27/17 11:53 AM  Result Value Ref Range Status   Specimen Description BLOOD LEFT ANTECUBITAL  Final   Special  Requests   Final    BOTTLES DRAWN AEROBIC AND ANAEROBIC Blood Culture adequate volume   Culture  Setup Time   Final    IN BOTH AEROBIC AND ANAEROBIC BOTTLES GRAM NEGATIVE RODS CRITICAL RESULT  CALLED TO, READ BACK BY AND VERIFIED WITH: TO JGRIMSELY(PHARMd) BY TCLEVELAND 04/28/2017 AT 4:25AM    Culture (A)  Final    ESCHERICHIA COLI SUSCEPTIBILITIES TO FOLLOW Performed at [facility], [place] [address], [place], [place] [address]    Report Status PENDING  Incomplete  Blood Culture ID Panel (Reflexed)     Status: Abnormal   Collection Time: 04/27/17 11:53 AM  Result Value Ref Range Status   Enterococcus species NOT DETECTED NOT DETECTED Final   Listeria monocytogenes NOT DETECTED NOT DETECTED Final   Staphylococcus species NOT DETECTED NOT DETECTED Final   Staphylococcus aureus NOT DETECTED NOT DETECTED Final   Streptococcus species NOT DETECTED NOT DETECTED Final   Streptococcus agalactiae NOT DETECTED NOT DETECTED Final   Streptococcus pneumoniae NOT DETECTED NOT DETECTED Final   Streptococcus pyogenes NOT DETECTED NOT DETECTED Final   Acinetobacter baumannii NOT DETECTED NOT DETECTED Final   Enterobacteriaceae species DETECTED (A) NOT DETECTED Final    Comment: Enterobacteriaceae represent a large family of gram-negative bacteria, not a single organism. CRITICAL RESULT CALLED TO, READ BACK BY AND VERIFIED WITH: TO JGRIMSELY(PHARMd) BY TCLEVELAND 04/28/2017 AT 4:25AM    Enterobacter cloacae complex NOT DETECTED NOT DETECTED Final   Escherichia coli DETECTED (A) NOT DETECTED Final    Comment: CRITICAL RESULT CALLED TO, READ BACK BY AND VERIFIED WITH: TO JGRIMSELY(PHARMd) BY TCLEVELAND 04/28/2017 AT 4:25AM    Klebsiella oxytoca NOT DETECTED NOT DETECTED Final   Klebsiella pneumoniae NOT DETECTED NOT DETECTED Final   Proteus species NOT DETECTED NOT DETECTED Final   Serratia marcescens NOT DETECTED NOT DETECTED Final   Carbapenem resistance NOT DETECTED NOT DETECTED Final   Haemophilus influenzae NOT DETECTED NOT DETECTED Final   Neisseria meningitidis NOT DETECTED NOT DETECTED Final   Pseudomonas aeruginosa NOT DETECTED NOT DETECTED Final   Candida albicans NOT DETECTED  NOT DETECTED Final   Candida glabrata NOT DETECTED NOT DETECTED Final   Candida krusei NOT DETECTED NOT DETECTED Final   Candida parapsilosis NOT DETECTED NOT DETECTED Final   Candida tropicalis NOT DETECTED NOT DETECTED Final    Comment: Performed at [facility], [place] [address] [place], [place] [address]  Culture, Urine     Status: Abnormal   Collection Time: 04/27/17  4:40 PM  Result Value Ref Range Status   Specimen Description URINE, CATHETERIZED  Final   Special Requests NONE  Final   Culture >=100,000 COLONIES/mL ESCHERICHIA COLI (A)  Final   Report Status 04/30/2017 FINAL  Final   Organism ID, Bacteria ESCHERICHIA COLI (A)  Final      Susceptibility   Escherichia coli - MIC*    AMPICILLIN >=32 RESISTANT Resistant     CEFAZOLIN <=4 SENSITIVE Sensitive     CEFTRIAXONE <=1 SENSITIVE Sensitive     CIPROFLOXACIN <=0.25 SENSITIVE Sensitive     GENTAMICIN >=16 RESISTANT Resistant     IMIPENEM <=0.25 SENSITIVE Sensitive     NITROFURANTOIN <=16 SENSITIVE Sensitive     TRIMETH/SULFA >=320 RESISTANT Resistant     AMPICILLIN/SULBACTAM >=32 RESISTANT Resistant     PIP/TAZO <=4 SENSITIVE Sensitive     Extended ESBL NEGATIVE Sensitive     * >=100,000 COLONIES/mL ESCHERICHIA COLI     Time coordinating discharge: 45 minutes  SIGNED:   Tawni Millers, MD  [facility] 04/30/2017, 10:07 AM Pager   If 7PM-7AM, please contact night-coverage [email or web address] Password [email or web address]

## 2017-04-30 NOTE — Progress Notes (Signed)
PT Cancellation Note  Patient Details Name: Tiffany Arnold MRN: [id number] DOB: 09/21/1941   Cancelled Treatment:    Reason Eval/Treat Not Completed: Called by RN. Attempted PT eval-unable to work with pt at this time-pt eating lunch. Will check back later today.   Weston Anna, MPT Pager: [phone]

## 2017-04-30 NOTE — Progress Notes (Signed)
Discharge instructions reviewed with patient and patient's daughter with teach-back. Patient and daughter verbalize understanding. Patient discharged via wheelchair. Patient's daughter is taking her home. Patient is not in distress.

## 2017-04-30 NOTE — Evaluation (Addendum)
Physical Therapy Evaluation Patient Details Name: Tiffany Arnold MRN: [id number] DOB: March 12, 1941 Today's Date: 04/30/2017   History of Present Illness  76 yo female admitted with AMS. Hx of breast cancer.   Clinical Impression  On eval, pt was Min guard assist for mobility. She walked ~200 feet with a RW. Pt presents with general weakness and decreased activity tolerance. Daughter present during session-reported pt had walked x 2 already in hallway without much difficulty. Pt lives at home with her husband who has dementia. At baseline, pt is Mod Ind. Recommend HHPT f/u to maximize independence, improve functional mobility, and facilitate pt's return to PLOF.     Follow Up Recommendations Home health PT;Supervision/Assistance - 24 hour    Equipment Recommendations  None recommended by PT    Recommendations for Other Services       Precautions / Restrictions Precautions Precautions: Fall Restrictions Weight Bearing Restrictions: No      Mobility  Bed Mobility               General bed mobility comments: oob in recliner  Transfers Overall transfer level: Needs assistance Equipment used: Rolling walker (2 wheeled) Transfers: Sit to/from Stand Sit to Stand: Min guard         General transfer comment: close guard for safety. VCs hand placement.   Ambulation/Gait Ambulation/Gait assistance: Min guard Ambulation Distance (Feet): 200 Feet Assistive device: Rolling walker (2 wheeled) Gait Pattern/deviations: Step-through pattern;Decreased stride length     General Gait Details: close guard for safety. Pt fatigued with distance. Unsteady but no overt LOB  Financial trader Rankin (Stroke Patients Only)       Balance Overall balance assessment: Needs assistance           Standing balance-Leahy Scale: Poor Standing balance comment: requires RW                             Pertinent Vitals/Pain Pain  Assessment: Faces Faces Pain Scale: Hurts little more Pain Location: pain in low back area Pain Intervention(s): Monitored during session;Repositioned    Home Living Family/patient expects to be discharged to:: Private residence Living Arrangements: Spouse/significant other (spouse has dementia)   Type of Home: House Home Access: Ramped entrance     Home Layout: Able to live on main level with bedroom/bathroom;Two level Home Equipment: Walker - 4 wheels      Prior Function Level of Independence: Independent with assistive device(s)         Comments: ambulatory with a rollator. Per daughter, pt was mod ind with activity/ADLs.     Hand Dominance        Extremity/Trunk Assessment   Upper Extremity Assessment Upper Extremity Assessment: Generalized weakness    Lower Extremity Assessment Lower Extremity Assessment: Generalized weakness    Cervical / Trunk Assessment Cervical / Trunk Assessment: Normal  Communication   Communication: No difficulties  Cognition Arousal/Alertness: Awake/alert Behavior During Therapy: WFL for tasks assessed/performed Overall Cognitive Status: Within Functional Limits for tasks assessed                                        General Comments      Exercises     Assessment/Plan    PT Assessment Patient needs continued PT services  PT Problem List Decreased strength;Decreased mobility;Decreased balance;Decreased activity tolerance;Decreased knowledge of use of DME;Obesity;Pain       PT Treatment Interventions DME instruction;Gait training;Therapeutic activities;Therapeutic exercise;Balance training;Functional mobility training;Patient/family education    PT Goals (Current goals can be found in the Care Plan section)  Acute Rehab PT Goals Patient Stated Goal: home PT Goal Formulation: With patient/family Time For Goal Achievement: 05/14/17 Potential to Achieve Goals: Good    Frequency Min 3X/week   Barriers  to discharge Decreased caregiver support Pt's husband has dementia. Daughter present during session-stated she could provide supervision/assist as needed temporarily.    Co-evaluation               AM-PAC PT "6 Clicks" Daily Activity  Outcome Measure Difficulty turning over in bed (including adjusting bedclothes, sheets and blankets)?: A Little Difficulty moving from lying on back to sitting on the side of the bed? : A Little Difficulty sitting down on and standing up from a chair with arms (e.g., wheelchair, bedside commode, etc,.)?: A Little Help needed moving to and from a bed to chair (including a wheelchair)?: A Little Help needed walking in hospital room?: A Little Help needed climbing 3-5 steps with a railing? : A Little 6 Click Score: 18    End of Session Equipment Utilized During Treatment: Gait belt Activity Tolerance: Patient limited by fatigue Patient left: in chair;with call bell/phone within reach;with family/visitor present   PT Visit Diagnosis: Muscle weakness (generalized) (M62.81);Difficulty in walking, not elsewhere classified (R26.2)    Time: 1219-7588 PT Time Calculation (min) (ACUTE ONLY): 11 min   Charges:   PT Evaluation $PT Eval Low Complexity: 1 Procedure     PT G Codes:          Weston Anna, MPT Pager: [phone]

## 2017-05-01 ENCOUNTER — Telehealth: Payer: Self-pay

## 2017-05-01 DIAGNOSIS — D696 Thrombocytopenia, unspecified: Secondary | ICD-10-CM | POA: Diagnosis not present

## 2017-05-01 DIAGNOSIS — E119 Type 2 diabetes mellitus without complications: Secondary | ICD-10-CM | POA: Diagnosis not present

## 2017-05-01 DIAGNOSIS — J4530 Mild persistent asthma, uncomplicated: Secondary | ICD-10-CM | POA: Diagnosis not present

## 2017-05-01 DIAGNOSIS — G9341 Metabolic encephalopathy: Secondary | ICD-10-CM | POA: Diagnosis not present

## 2017-05-01 DIAGNOSIS — D693 Immune thrombocytopenic purpura: Secondary | ICD-10-CM | POA: Diagnosis not present

## 2017-05-01 DIAGNOSIS — Z6841 Body Mass Index (BMI) 40.0 and over, adult: Secondary | ICD-10-CM | POA: Diagnosis not present

## 2017-05-01 DIAGNOSIS — N390 Urinary tract infection, site not specified: Secondary | ICD-10-CM | POA: Diagnosis not present

## 2017-05-01 DIAGNOSIS — I10 Essential (primary) hypertension: Secondary | ICD-10-CM | POA: Diagnosis not present

## 2017-05-01 DIAGNOSIS — G4733 Obstructive sleep apnea (adult) (pediatric): Secondary | ICD-10-CM | POA: Diagnosis not present

## 2017-05-01 LAB — VITAMIN B1: VITAMIN B1 (THIAMINE): 98 nmol/L (ref 66.5–200.0)

## 2017-05-01 NOTE — Telephone Encounter (Signed)
Spoke to [place] and she is aware as instructed we will discuss everything at [place]

## 2017-05-01 NOTE — Telephone Encounter (Addendum)
[name] pts sister (DPR signed) left v/m; [name] concerned pt may need hospital bed; BP reader and glucometer. [name] also has question about a med. Left v/m requesting sharon to cb. Pt has 30' hospital f/u already scheduled 05/04/17 at 11:15 with Avie Echevaria NP. [name] called back and thought glipizide had been stopped prior to pt going into hospital. I could not find where glipizide had been stopped. [name] noted under discharge note; under brief interim summary # 6 about pt not taking BP med unless systolic above 927. Pt came home from hospital 03/31/17; pt did not get BP med on 03/31/17 and today BP 118/57. Melanie CMA said she will review hospital d/c and call sharon back this afternoon before end of day. [name] voiced understanding.

## 2017-05-03 LAB — CULTURE, BLOOD (ROUTINE X 2): CULTURE: NO GROWTH

## 2017-05-04 ENCOUNTER — Encounter: Payer: Self-pay | Admitting: Internal Medicine

## 2017-05-04 ENCOUNTER — Ambulatory Visit (INDEPENDENT_AMBULATORY_CARE_PROVIDER_SITE_OTHER): Payer: Medicare PPO | Admitting: Internal Medicine

## 2017-05-04 VITALS — BP 124/60 | HR 66 | Temp 98.2°F | Wt 196.8 lb

## 2017-05-04 DIAGNOSIS — R6521 Severe sepsis with septic shock: Secondary | ICD-10-CM | POA: Diagnosis not present

## 2017-05-04 DIAGNOSIS — N390 Urinary tract infection, site not specified: Secondary | ICD-10-CM | POA: Diagnosis not present

## 2017-05-04 DIAGNOSIS — A419 Sepsis, unspecified organism: Principal | ICD-10-CM

## 2017-05-05 ENCOUNTER — Encounter: Payer: Self-pay | Admitting: Internal Medicine

## 2017-05-05 DIAGNOSIS — Z6841 Body Mass Index (BMI) 40.0 and over, adult: Secondary | ICD-10-CM | POA: Diagnosis not present

## 2017-05-05 DIAGNOSIS — N390 Urinary tract infection, site not specified: Secondary | ICD-10-CM | POA: Diagnosis not present

## 2017-05-05 DIAGNOSIS — I10 Essential (primary) hypertension: Secondary | ICD-10-CM | POA: Diagnosis not present

## 2017-05-05 DIAGNOSIS — J4530 Mild persistent asthma, uncomplicated: Secondary | ICD-10-CM | POA: Diagnosis not present

## 2017-05-05 DIAGNOSIS — D696 Thrombocytopenia, unspecified: Secondary | ICD-10-CM | POA: Diagnosis not present

## 2017-05-05 DIAGNOSIS — E119 Type 2 diabetes mellitus without complications: Secondary | ICD-10-CM | POA: Diagnosis not present

## 2017-05-05 DIAGNOSIS — G9341 Metabolic encephalopathy: Secondary | ICD-10-CM | POA: Diagnosis not present

## 2017-05-05 DIAGNOSIS — G4733 Obstructive sleep apnea (adult) (pediatric): Secondary | ICD-10-CM | POA: Diagnosis not present

## 2017-05-05 NOTE — Progress Notes (Signed)
Subjective:    Patient ID: Tiffany Arnold, female    DOB: Oct 27, 1941, 76 y.o.   MRN: [id number]  HPI  Pt presents to the clinic today for TCM hospital followup. She went to the ER 5/7 with c/o AMS. She was found to be hypotensive. Liver enzymes were elevated, but RUQ ultrasound was negative for DVT. Her WBC count was 14.1. D dimer was elevated at 14.7, but she had negative bilateral venous dopplers. Head CT and MRI were both negative for any acute findings. Chest xray was negative for pneumonia. Her urine culture grew out [employer]. Her blood cultures were positive for Ecoli as well. She was treated for urosepsis with IV antibiotics and fluids. She was transitioned to oral antibiotics and discharged home on 5/10. Since discharge, she reports she is intermittently confused, but nowhere like she was. She is still fatigued and weak, but PT has not evaluated her yet. She sleeps well. Her appetite is normal. She is urinating normally and her bowels are moving fine. She is still taking her abx as prescribed.  Review of Systems      Past Medical History:  Diagnosis Date  . Allergy   . Arthritis   . Asthma   . Cancer ([place]) 07/29/2016   right breast  . Cataract of both eyes   . Chicken pox   . Colon polyps   . Dog bite of index finger 07/23/2016   Left index finger  . GERD (gastroesophageal reflux disease)   . History of radiation therapy 12/24/16- 02/03/17   Right Breast 50.4 Gy in 28 fractions, Right Axilla 45 Gy in 25 fractions.  . Hyperlipidemia   . Hypertension   . IBS (irritable bowel syndrome)   . Phlebitis   . Pneumonia    hx of several yrs ago  . Shortness of breath dyspnea    with exertion only  . Sleep apnea    wears CPAP machine nightly    Current Outpatient Prescriptions  Medication Sig Dispense Refill  . albuterol-ipratropium (COMBIVENT) 18-103 MCG/ACT inhaler Inhale 2 puffs into the lungs every 4 (four) hours as needed for wheezing or shortness of breath.     Marland Kitchen  alendronate (FOSAMAX) 70 MG tablet Take 1 tablet (70 mg total) by mouth once a week. Take with a full glass of water on an empty stomach. 4 tablet 5  . ALPRAZolam (XANAX) 0.25 MG tablet TAKE ONE TABLET BY MOUTH TWICE DAILY AS NEEDED 60 tablet 0  . Calcium Carbonate-Vitamin D (CALCIUM-VITAMIN D) 500-200 MG-UNIT tablet Take 1 tablet by mouth daily.    . cephALEXin (KEFLEX) 500 MG capsule Take 1 capsule (500 mg total) by mouth 2 (two) times daily. 14 capsule 0  . cetirizine (ZYRTEC) 10 MG tablet Take 10 mg by mouth daily.    . Cholecalciferol (VITAMIN D3) 1000 units CAPS Take by mouth 2 (two) times daily.    Marland Kitchen EPINEPHrine 0.3 mg/0.3 mL IJ SOAJ injection Inject 0.3 mg into the muscle once as needed (ALLERGIC REACTION).     Marland Kitchen glipiZIDE (GLUCOTROL) 5 MG tablet TAKE ONE TABLET TWICE DAILY BEFORE A MEAL 90 tablet 0  . glucosamine-chondroitin 500-400 MG tablet Take 1 tablet by mouth 2 (two) times daily.    Marland Kitchen letrozole (FEMARA) 2.5 MG tablet Take 1 tablet (2.5 mg total) by mouth daily. 30 tablet 5  . oxybutynin (DITROPAN-XL) 10 MG 24 hr tablet Take 1 tablet (10 mg total) by mouth at bedtime. 90 tablet 1  . potassium chloride (K-DUR)  10 MEQ tablet Take 1 tablet (10 mEq total) by mouth 2 (two) times daily. 180 tablet 0  . pravastatin (PRAVACHOL) 40 MG tablet Take 1 tablet (40 mg total) by mouth daily. 90 tablet 1  . ranitidine (ZANTAC) 150 MG tablet Take 150 mg by mouth every evening.     . sertraline (ZOLOFT) 25 MG tablet TAKE ONE (1) TABLET EACH DAY 30 tablet 2  . diphenoxylate-atropine (LOMOTIL) 2.5-0.025 MG tablet Take 2 tablets by mouth 4 (four) times daily as needed for diarrhea or loose stools. (Patient not taking: Reported on 05/04/2017) 30 tablet 0  . Olmesartan-Amlodipine-HCTZ 40-5-25 MG TABS Take 1 tablet by mouth daily. (Patient not taking: Reported on 05/04/2017) 90 tablet 1  . vitamin B-12 (CYANOCOBALAMIN) 1000 MCG tablet Take 1,000 mcg by mouth daily.     No current facility-administered  medications for this visit.     Allergies  Allergen Reactions  . Other Swelling and Shortness Of Breath    Cats, dogs, mold Induced asthma Cats, dogs, mold    Family History  Problem Relation Age of Onset  . Arthritis Mother   . Stroke Mother   . Hypertension Mother   . Cancer Father        Prostate  . Stroke Father   . Hypertension Father   . Hypertension Maternal Grandmother   . Rheum arthritis Maternal Grandfather   . Stroke Maternal Grandfather   . Hypertension Maternal Grandfather   . Cancer Paternal Grandmother        Colon  . Hypertension Paternal Grandmother   . Hypertension Paternal Grandfather   . Breast cancer Neg Hx     Social History   Social History  . Marital status: Married    Spouse name: N/A  . Number of children: N/A  . Years of education: N/A   Occupational History  . Not on file.   Social History Main Topics  . Smoking status: Former Smoker    Packs/day: 2.00    Years: 25.00    Quit date: 12/22/1990  . Smokeless tobacco: Never Used     Comment: quit 24 years ago  . Alcohol use Yes     Comment: rare  . Drug use: No  . Sexual activity: Not Currently   Other Topics Concern  . Not on file   Social History Narrative  . No narrative on file     Constitutional: Pt reports fatigue. Denies fever, malaise, headache or abrupt weight changes.  Respiratory: Denies difficulty breathing, shortness of breath, cough or sputum production.   Cardiovascular: Denies chest pain, chest tightness, palpitations or swelling in the hands or feet.  Gastrointestinal: Denies abdominal pain, bloating, constipation, diarrhea or blood in the stool.  GU: Denies urgency, frequency, pain with urination, burning sensation, blood in urine, odor or discharge. Musculoskeletal: Pt reports weakness. Denies decrease in range of motion, muscle pain or joint pain and swelling.  Neurological: Pt reports intermittent confusion. Denies dizziness, difficulty with memory,  difficulty with speech or problems with balance and coordination.    No other specific complaints in a complete review of systems (except as listed in HPI above).  Objective:   Physical Exam  BP 124/60   Pulse 66   Temp 98.2 F (36.8 C) (Oral)   Wt 196 lb 12 oz (89.2 kg)   SpO2 96%   BMI 38.43 kg/m  Wt Readings from Last 3 Encounters:  05/04/17 196 lb 12 oz (89.2 kg)  04/27/17 198 lb (89.8 kg)  04/01/17  199 lb 1.6 oz (90.3 kg)    General: Appears her stated age, chronically ill appearing, in NAD. Cardiovascular: Normal rate and rhythm.  Pulmonary/Chest: Normal effort and positive vesicular breath sounds. No respiratory distress. No wheezes, rales or ronchi noted.  Abdomen: Soft and nontender. Normal bowel sounds.  Musculoskeletal: Gait slow but steady with the use of a rolling walker. Neurological: Alert and oriented.    BMET    Component Value Date/Time   NA 135 04/30/2017 0353   NA 141 04/01/2017 1003   K 3.5 04/30/2017 0353   K 3.7 04/01/2017 1003   CL 103 04/30/2017 0353   CO2 25 04/30/2017 0353   CO2 25 04/01/2017 1003   GLUCOSE 133 (H) 04/30/2017 0353   GLUCOSE 62 (L) 04/01/2017 1003   BUN 40 (H) 04/30/2017 0353   BUN 18.6 04/01/2017 1003   CREATININE 0.68 04/30/2017 0353   CREATININE 0.7 04/01/2017 1003   CALCIUM 9.0 04/30/2017 0353   CALCIUM 10.4 04/01/2017 1003   GFRNONAA >60 04/30/2017 0353   GFRAA >60 04/30/2017 0353    Lipid Panel     Component Value Date/Time   CHOL 155 03/05/2017 1058   TRIG 94.0 03/05/2017 1058   HDL 45.50 03/05/2017 1058   CHOLHDL 3 03/05/2017 1058   VLDL 18.8 03/05/2017 1058   LDLCALC 91 03/05/2017 1058    CBC    Component Value Date/Time   WBC 7.7 04/30/2017 0353   RBC 3.94 04/30/2017 0353   HGB 10.9 (L) 04/30/2017 0353   HGB 11.6 04/01/2017 1003   HCT 33.1 (L) 04/30/2017 0353   HCT 34.8 04/01/2017 1003   PLT 45 (L) 04/30/2017 0353   PLT 263 04/01/2017 1003   MCV 84.0 04/30/2017 0353   MCV 84.7 04/01/2017  1003   MCH 27.7 04/30/2017 0353   MCHC 32.9 04/30/2017 0353   RDW 15.8 (H) 04/30/2017 0353   RDW 15.7 (H) 04/01/2017 1003   LYMPHSABS 0.9 04/30/2017 0353   LYMPHSABS 1.3 04/01/2017 1003   MONOABS 1.3 (H) 04/30/2017 0353   MONOABS 0.5 04/01/2017 1003   EOSABS 0.1 04/30/2017 0353   EOSABS 0.1 04/01/2017 1003   BASOSABS 0.0 04/30/2017 0353   BASOSABS 0.0 04/01/2017 1003    Hgb A1C Lab Results  Component Value Date   HGBA1C 5.5 03/05/2017           Assessment & Plan:   [facility] Followup for Urosepsis:  Hospital notes, labs and imaging reviewed Clinically improved Encouraged her to continue to push fluids Continue abx for now Still awaiting home PT eval

## 2017-05-05 NOTE — Patient Instructions (Signed)
Sepsis, Adult Sepsis is a serious bodily reaction to an infection. The infection that causes sepsis may be from a bacteria, a virus, a fungus, or a parasite. Sepsis can result from an infection in any part of the body. Infections that commonly lead to sepsis include skin, lung, and urinary tract infections. Sepsis is a medical emergency that requires immediate treatment at the hospital. In severe cases, it can lead to septic shock. Shock can weaken the heart and cause blood pressure to drop. This can make the central nervous system and the body's organs to stop working. What are the causes? This condition is caused by a severe reaction to a bacterial, viral, fungal, or parasitic infection. The germs that most commonly lead to sepsis include:  Escherichia coli (E. coli).  Staphylococcus aureus (staph).  The most common infections that lead to sepsis include infections of:  The skin.  The lung (pneumonia).  The gut.  The kidneys (urinary tract infection).  What increases the risk? You are more likely to develop this condition if:  You have a weakened disease-fighting (immune) system.  You are 65 or older.  You are female.  You had surgery, or you have been hospitalized.  You have a catheter, breathing tube, or drainage tubes inserted into your body.  You are not getting enough nutrients from food (are malnourished).  You have other long-term (chronic) diseases, including: ? Cancer. ? AIDS. ? Liver disease. ? Lung disease. ? Diabetes.  You have severe burns or injuries.  You inject drugs.  You have heart valve problems.  What are the signs or symptoms? Symptoms of this condition may include:  Fever.  Chills or feeling very cold.  Fast heart rate (tachycardia).  Rapid breathing (hyperventilation).  Shortness of breath.  Confusion or light-headedness.  Changes in skin color. Your skin may look blotchy, pale, or blue.  Cool, clammy skin or sweaty skin.  Skin  rash.  Nausea and vomiting.  Urinating much less than usual.  How is this diagnosed? This condition is diagnosed based on:  Your symptoms.  Your medical history.  A physical exam.  Other tests may also be done to find out the cause of the infection and how severe the sepsis is. These tests may include:  Blood tests.  Urine tests.  Swabs from other areas of the body that may have an infection. These samples may be tested (cultured) to find out what type of bacteria is causing the infection.  Chest X-ray to check for pneumonia. Other imaging tests, such as a CT scan, may also be done.  Lumbar puncture. This is a procedure to remove a small amount of the fluid that surrounds the brain and spinal cord. The fluid is then examined for infection.  How is this treated? This condition is treated in a hospital with antibiotic medicines. You may also receive:  Fluids through an IV tube.  Oxygen and breathing assistance.  Kidney dialysis. This process cleans the blood if the kidneys have failed.  Surgery to remove infected tissue.  Medicines to increase your blood pressure.  Nutrients to correct imbalances in basic body function (metabolism). This may involve receiving important salts and minerals (electrolytes) through an IV and having your blood sugar level adjusted.  Steroid medicines to control your body's reaction to the infection.  Follow these instructions at home: Medicines  Take over-the-counter and prescription medicines only as told by your health care provider.  If you were prescribed an antibiotic or anti-fungal medicine, take it   as told by your health care provider. Do not stop taking the antibiotic or anti-fungal medicine even if you start to feel better. Activity  Rest and gradually return to your normal activities. Ask your health care provider what activities are safe for you.  Try to set small, achievable goals each week, such as dressing yourself,  bathing, or walking up stairs. It may take a while to rebuild your strength.  Try to exercise regularly, if you feel healthy enough to do so. Ask your health care provider what exercises are safe for you. General instructions  Drink enough fluid to keep your urine clear or pale yellow.  Eat a healthy, balanced diet. This includes plenty of fruits and vegetables, whole grains, and lowfat (lean) proteins. Ask your health care provider if you should avoid certain foods.  Keep all follow-up visits as told by your health care provider. This is important. Contact a health care provider if:  You do not feel like you are getting better or regaining strength.  You are having trouble coping with your recovery.  You frequently feel tired.  You feel worse or do not seem to get better after surgery.  You think you may have an infection after surgery. Get help right away if:  You have any symptoms of sepsis.  You have difficulty breathing.  You have a rapid or skipping heartbeat.  You become confused.  You have a high fever.  Your skin becomes blotchy, pale, or blue. These symptoms may represent a serious problem that is an emergency. Do not wait to see if the symptoms will go away. Get medical help right away. Call your local emergency services (911 in the [place] Summary  Sepsis is a medical emergency that requires immediate treatment at the hospital.  This condition is caused by a severe reaction to a bacterial, viral, fungal, or parasitic infection.  This condition is treated in a hospital with antibiotics. Treatment may also include IV fluids, breathing assistance, and kidney dialysis.  If you were prescribed an antibiotic or anti-fungal medicine, take it as told by your health care provider. Do not stop taking the antibiotic or anti-fungal medicine even if you start to feel better. This information is not intended to replace advice given to you by your health care provider. Make  sure you discuss any questions you have with your health care provider. Document Released: 09/06/2003 Document Revised: 11/11/2016 Document Reviewed: 11/11/2016 Elsevier Interactive Patient Education  2017 [employer]  

## 2017-05-06 ENCOUNTER — Telehealth: Payer: Self-pay

## 2017-05-06 NOTE — Telephone Encounter (Signed)
Rowe Clack OT with [facility] left v/m; planning to do [facility] OT evaluation the week of 05/11/17. The delay is due to OT schedule and pts availability. FYI to Avie Echevaria NP.

## 2017-05-06 NOTE — Telephone Encounter (Signed)
noted 

## 2017-05-07 ENCOUNTER — Ambulatory Visit: Payer: Medicare PPO | Admitting: Internal Medicine

## 2017-05-07 DIAGNOSIS — G4733 Obstructive sleep apnea (adult) (pediatric): Secondary | ICD-10-CM | POA: Diagnosis not present

## 2017-05-08 DIAGNOSIS — G9341 Metabolic encephalopathy: Secondary | ICD-10-CM | POA: Diagnosis not present

## 2017-05-08 DIAGNOSIS — E119 Type 2 diabetes mellitus without complications: Secondary | ICD-10-CM | POA: Diagnosis not present

## 2017-05-08 DIAGNOSIS — D696 Thrombocytopenia, unspecified: Secondary | ICD-10-CM | POA: Diagnosis not present

## 2017-05-08 DIAGNOSIS — J4530 Mild persistent asthma, uncomplicated: Secondary | ICD-10-CM | POA: Diagnosis not present

## 2017-05-08 DIAGNOSIS — N390 Urinary tract infection, site not specified: Secondary | ICD-10-CM | POA: Diagnosis not present

## 2017-05-08 DIAGNOSIS — Z6841 Body Mass Index (BMI) 40.0 and over, adult: Secondary | ICD-10-CM | POA: Diagnosis not present

## 2017-05-08 DIAGNOSIS — I10 Essential (primary) hypertension: Secondary | ICD-10-CM | POA: Diagnosis not present

## 2017-05-08 DIAGNOSIS — G4733 Obstructive sleep apnea (adult) (pediatric): Secondary | ICD-10-CM | POA: Diagnosis not present

## 2017-05-11 DIAGNOSIS — Z6841 Body Mass Index (BMI) 40.0 and over, adult: Secondary | ICD-10-CM | POA: Diagnosis not present

## 2017-05-11 DIAGNOSIS — G9341 Metabolic encephalopathy: Secondary | ICD-10-CM | POA: Diagnosis not present

## 2017-05-11 DIAGNOSIS — E119 Type 2 diabetes mellitus without complications: Secondary | ICD-10-CM | POA: Diagnosis not present

## 2017-05-11 DIAGNOSIS — N390 Urinary tract infection, site not specified: Secondary | ICD-10-CM | POA: Diagnosis not present

## 2017-05-11 DIAGNOSIS — G4733 Obstructive sleep apnea (adult) (pediatric): Secondary | ICD-10-CM | POA: Diagnosis not present

## 2017-05-11 DIAGNOSIS — D696 Thrombocytopenia, unspecified: Secondary | ICD-10-CM | POA: Diagnosis not present

## 2017-05-11 DIAGNOSIS — I10 Essential (primary) hypertension: Secondary | ICD-10-CM | POA: Diagnosis not present

## 2017-05-11 DIAGNOSIS — J4530 Mild persistent asthma, uncomplicated: Secondary | ICD-10-CM | POA: Diagnosis not present

## 2017-05-12 ENCOUNTER — Ambulatory Visit: Payer: Medicare PPO | Admitting: Internal Medicine

## 2017-05-12 DIAGNOSIS — G9341 Metabolic encephalopathy: Secondary | ICD-10-CM | POA: Diagnosis not present

## 2017-05-12 DIAGNOSIS — E119 Type 2 diabetes mellitus without complications: Secondary | ICD-10-CM | POA: Diagnosis not present

## 2017-05-12 DIAGNOSIS — I10 Essential (primary) hypertension: Secondary | ICD-10-CM | POA: Diagnosis not present

## 2017-05-12 DIAGNOSIS — N390 Urinary tract infection, site not specified: Secondary | ICD-10-CM | POA: Diagnosis not present

## 2017-05-12 DIAGNOSIS — Z6841 Body Mass Index (BMI) 40.0 and over, adult: Secondary | ICD-10-CM | POA: Diagnosis not present

## 2017-05-12 DIAGNOSIS — J4530 Mild persistent asthma, uncomplicated: Secondary | ICD-10-CM | POA: Diagnosis not present

## 2017-05-12 DIAGNOSIS — G4733 Obstructive sleep apnea (adult) (pediatric): Secondary | ICD-10-CM | POA: Diagnosis not present

## 2017-05-12 DIAGNOSIS — D696 Thrombocytopenia, unspecified: Secondary | ICD-10-CM | POA: Diagnosis not present

## 2017-05-13 ENCOUNTER — Telehealth: Payer: Self-pay | Admitting: Internal Medicine

## 2017-05-13 DIAGNOSIS — N390 Urinary tract infection, site not specified: Secondary | ICD-10-CM | POA: Diagnosis not present

## 2017-05-13 DIAGNOSIS — D696 Thrombocytopenia, unspecified: Secondary | ICD-10-CM | POA: Diagnosis not present

## 2017-05-13 DIAGNOSIS — E119 Type 2 diabetes mellitus without complications: Secondary | ICD-10-CM | POA: Diagnosis not present

## 2017-05-13 DIAGNOSIS — G9341 Metabolic encephalopathy: Secondary | ICD-10-CM | POA: Diagnosis not present

## 2017-05-13 NOTE — Telephone Encounter (Signed)
Left message on voicemail.

## 2017-05-13 NOTE — Telephone Encounter (Signed)
Ok to take Hydrocodone if pain not controlled with Tylenol alone

## 2017-05-13 NOTE — Telephone Encounter (Signed)
°  Patient Name: Tiffany Arnold  DOB: 19-Mar-1941    Initial Comment Caller states sister in hospital recently, has been seen since; in tears from pain from waist down in her back and legs; wants to know if can give pain meds, prescribed after breast surgery in Sept;    Nurse Assessment  Nurse: Sherrell Puller, RN, Amy Date/Time Eilene Ghazi Time): 05/13/2017 11:29:43 AM  Confirm and document reason for call. If symptomatic, describe symptoms. ---Caller states she made her sister an appointment to be seen on Friday and is not actually with her but when she was on the phone with her brother-in-law she heard her in the background. She is having pain in her back and legs and she's wondering if she can give her Hydrocodone that she took after her surgery in September? This nurse advised that I would not be able to triage the patient's symptoms without her being there and also I'm not able to say if it's okay for her to take the Hydrocodone that she was receiving after her surgery. She was advised to call the office and ask for a message to be given to the physician to call her back. The patient's sister verbalized understanding.  Does the patient have any new or worsening symptoms? ---Yes  Will a triage be completed? ---No  Select reason for no triage. ---Other  Please document clinical information provided and list any resource used. ---Patient is not currently with sister, cannot complete a safe triage. Has medication question but for physician.     Guidelines    Guideline Title Affirmed Question Affirmed Notes       Final Disposition User   Clinical Call Sherrell Puller, RN, Amy

## 2017-05-14 DIAGNOSIS — G4733 Obstructive sleep apnea (adult) (pediatric): Secondary | ICD-10-CM | POA: Diagnosis not present

## 2017-05-14 DIAGNOSIS — Z6841 Body Mass Index (BMI) 40.0 and over, adult: Secondary | ICD-10-CM | POA: Diagnosis not present

## 2017-05-14 DIAGNOSIS — J4530 Mild persistent asthma, uncomplicated: Secondary | ICD-10-CM | POA: Diagnosis not present

## 2017-05-14 DIAGNOSIS — I10 Essential (primary) hypertension: Secondary | ICD-10-CM | POA: Diagnosis not present

## 2017-05-14 DIAGNOSIS — D696 Thrombocytopenia, unspecified: Secondary | ICD-10-CM | POA: Diagnosis not present

## 2017-05-14 DIAGNOSIS — G9341 Metabolic encephalopathy: Secondary | ICD-10-CM | POA: Diagnosis not present

## 2017-05-14 DIAGNOSIS — E119 Type 2 diabetes mellitus without complications: Secondary | ICD-10-CM | POA: Diagnosis not present

## 2017-05-14 DIAGNOSIS — N390 Urinary tract infection, site not specified: Secondary | ICD-10-CM | POA: Diagnosis not present

## 2017-05-15 ENCOUNTER — Ambulatory Visit (INDEPENDENT_AMBULATORY_CARE_PROVIDER_SITE_OTHER): Payer: Medicare PPO | Admitting: Family Medicine

## 2017-05-15 ENCOUNTER — Encounter: Payer: Self-pay | Admitting: Family Medicine

## 2017-05-15 ENCOUNTER — Telehealth: Payer: Self-pay | Admitting: Internal Medicine

## 2017-05-15 VITALS — BP 118/56 | HR 70 | Temp 98.2°F | Ht 60.0 in | Wt 189.8 lb

## 2017-05-15 DIAGNOSIS — G4733 Obstructive sleep apnea (adult) (pediatric): Secondary | ICD-10-CM | POA: Diagnosis not present

## 2017-05-15 DIAGNOSIS — D696 Thrombocytopenia, unspecified: Secondary | ICD-10-CM | POA: Diagnosis not present

## 2017-05-15 DIAGNOSIS — G9341 Metabolic encephalopathy: Secondary | ICD-10-CM | POA: Diagnosis not present

## 2017-05-15 DIAGNOSIS — Z6841 Body Mass Index (BMI) 40.0 and over, adult: Secondary | ICD-10-CM | POA: Diagnosis not present

## 2017-05-15 DIAGNOSIS — G9340 Encephalopathy, unspecified: Secondary | ICD-10-CM

## 2017-05-15 DIAGNOSIS — N390 Urinary tract infection, site not specified: Secondary | ICD-10-CM | POA: Diagnosis not present

## 2017-05-15 DIAGNOSIS — J4530 Mild persistent asthma, uncomplicated: Secondary | ICD-10-CM | POA: Diagnosis not present

## 2017-05-15 DIAGNOSIS — R300 Dysuria: Secondary | ICD-10-CM | POA: Diagnosis not present

## 2017-05-15 DIAGNOSIS — I10 Essential (primary) hypertension: Secondary | ICD-10-CM | POA: Diagnosis not present

## 2017-05-15 DIAGNOSIS — E119 Type 2 diabetes mellitus without complications: Secondary | ICD-10-CM | POA: Diagnosis not present

## 2017-05-15 LAB — POC URINALSYSI DIPSTICK (AUTOMATED)
BILIRUBIN UA: NEGATIVE
Blood, UA: NEGATIVE
GLUCOSE UA: NEGATIVE
Ketones, UA: NEGATIVE
Nitrite, UA: NEGATIVE
Protein, UA: NEGATIVE
Spec Grav, UA: 1.025 (ref 1.010–1.025)
Urobilinogen, UA: 0.2 E.U./dL
pH, UA: 6 (ref 5.0–8.0)

## 2017-05-15 MED ORDER — CEPHALEXIN 500 MG PO CAPS: 500.0000 mg | ORAL_CAPSULE | Freq: Three times a day (TID) | ORAL | 0 refills | Status: DC

## 2017-05-15 NOTE — Telephone Encounter (Signed)
Seen and evaluated.

## 2017-05-15 NOTE — Patient Instructions (Signed)
History of UTi leading to sepsis and hospitalization recently.  Mild confusion along with burning with peeing and back pain concerning for UTI.   We will restart keflex but three times a day this time.    Discussed should have improvement within 24-48 hours but if not she should seek care especially if fever or worsening memory issues.   Already set up for Tuesday follow up with PCP at [facility].

## 2017-05-15 NOTE — Progress Notes (Signed)
Subjective:  Tiffany Arnold is a 76 y.o. year old very pleasant female patient who presents for/with See problem oriented charting ROS- no fever, chills, nausea, vomiting. Does have left low back pain. No suprapubic pain.    Past Medical History-  Patient Active Problem List   Diagnosis Date Noted  . Acute metabolic encephalopathy 16/09/9603  . Osteopenia 04/01/2017  . Port catheter in place 12/25/2016  . IBS (irritable bowel syndrome) 11/25/2016  . Breast cancer of lower-inner quadrant of right female breast ([place]) 06/27/2016  . OSA on CPAP 06/26/2016  . Type 2 diabetes mellitus without complication ([place]) 54/08/8118  . Severe obesity (BMI >= 40) ([place]) 09/04/2014  . Essential hypertension 09/04/2014  . HLD (hyperlipidemia) 09/04/2014  . Gastroesophageal reflux disease without esophagitis 09/04/2014  . Asthma, mild persistent 09/04/2014  . Seasonal allergies 09/04/2014  . Generalized anxiety disorder 09/04/2014    Medications- reviewed and updated Current Outpatient Prescriptions  Medication Sig Dispense Refill  . albuterol-ipratropium (COMBIVENT) 18-103 MCG/ACT inhaler Inhale 2 puffs into the lungs every 4 (four) hours as needed for wheezing or shortness of breath.     Marland Kitchen alendronate (FOSAMAX) 70 MG tablet Take 1 tablet (70 mg total) by mouth once a week. Take with a full glass of water on an empty stomach. 4 tablet 5  . ALPRAZolam (XANAX) 0.25 MG tablet TAKE ONE TABLET BY MOUTH TWICE DAILY AS NEEDED 60 tablet 0  . Calcium Carbonate-Vitamin D (CALCIUM-VITAMIN D) 500-200 MG-UNIT tablet Take 1 tablet by mouth daily.    . cetirizine (ZYRTEC) 10 MG tablet Take 10 mg by mouth daily.    . Cholecalciferol (VITAMIN D3) 1000 units CAPS Take by mouth 2 (two) times daily.    . diphenoxylate-atropine (LOMOTIL) 2.5-0.025 MG tablet Take 2 tablets by mouth 4 (four) times daily as needed for diarrhea or loose stools. 30 tablet 0  . glipiZIDE (GLUCOTROL) 5 MG tablet TAKE ONE TABLET TWICE DAILY  BEFORE A MEAL 90 tablet 0  . glucosamine-chondroitin 500-400 MG tablet Take 1 tablet by mouth 2 (two) times daily.    Marland Kitchen letrozole (FEMARA) 2.5 MG tablet Take 1 tablet (2.5 mg total) by mouth daily. 30 tablet 5  . Olmesartan-Amlodipine-HCTZ 40-5-25 MG TABS Take 1 tablet by mouth daily. 90 tablet 1  . oxybutynin (DITROPAN-XL) 10 MG 24 hr tablet Take 1 tablet (10 mg total) by mouth at bedtime. 90 tablet 1  . potassium chloride (K-DUR) 10 MEQ tablet Take 1 tablet (10 mEq total) by mouth 2 (two) times daily. 180 tablet 0  . pravastatin (PRAVACHOL) 40 MG tablet Take 1 tablet (40 mg total) by mouth daily. 90 tablet 1  . ranitidine (ZANTAC) 150 MG tablet Take 150 mg by mouth every evening.     . sertraline (ZOLOFT) 25 MG tablet TAKE ONE (1) TABLET EACH DAY 30 tablet 2  . vitamin B-12 (CYANOCOBALAMIN) 1000 MCG tablet Take 1,000 mcg by mouth daily.    Marland Kitchen EPINEPHrine 0.3 mg/0.3 mL IJ SOAJ injection Inject 0.3 mg into the muscle once as needed (ALLERGIC REACTION).      No current facility-administered medications for this visit.     Objective: BP (!) 118/56 (BP Location: Left Arm, Patient Position: Sitting, Cuff Size: Large)   Pulse 70   Temp 98.2 F (36.8 C) (Oral)   Ht 5' (1.524 m)   Wt 189 lb 12.8 oz (86.1 kg)   SpO2 93%   BMI 37.07 kg/m  Gen: NAD, resting comfortably Oriented to person, place.  CV:  RRR no murmurs rubs or gallops Lungs: CTAB no crackles, wheeze, rhonchi Abdomen: soft/nontender including suprapubic/nondistended/normal bowel sounds. No rebound or guarding.  No cva tenderness Msk: no pain with palpation of low back Ext: no edema Skin: warm, dry Neuro: walks with walker  Results for orders placed or performed in visit on 05/15/17 (from the past 24 hour(s))  POCT Urinalysis Dipstick (Automated)     Status: Abnormal   Collection Time: 05/15/17  4:10 PM  Result Value Ref Range   Color, UA yellow    Clarity, UA clear    Glucose, UA N    Bilirubin, UA N    Ketones, UA N     Spec Grav, UA 1.025 1.010 - 1.025   Blood, UA N    pH, UA 6.0 5.0 - 8.0   Protein, UA N    Urobilinogen, UA 0.2 0.2 or 1.0 E.U./dL   Nitrite, UA N    Leukocytes, UA Small (1+) (A) Negative   Assessment/Plan:  Dysuria - Plan: UA, Urine culture S: Patient started with some dysuria about 3-4 days ago. Also started with some right low back pain which she states can become severe at times. No history of kidney stones. Aleve tends to help. Sister also has some hydrocodone from prior breast surgery and they gave her one dose with more severe pain.   Since the back pain and dysuria, she has had some confusion and appears slightly more agitated. Her confusion from prior UTI had almost completely cleared (though appears has had some baseline issues for at least a year which she wants to discuss with PCP next week)She has no back pain at present. I do not know her baseline but she does seem mildly confused thinking reason for visit is not drinking enough water.  A/P: History of UTi leading to sepsis and hospitalization recently. Likely mild encephalopathy related to UTI. Was E. Coli sensitive to keflex. We will restart keflex but at TID considering GFR >60. Discussed should have improvement within 24-48 hours but if not she should seek care especially if fever or worsening memory issues. Already set up for Tuesday follow up with PCP at [facility].   Orders Placed This Encounter  Procedures  . Urine culture  . POCT Urinalysis Dipstick (Automated)    Meds ordered this encounter  Medications  . cephALEXin (KEFLEX) 500 MG capsule    Sig: Take 1 capsule (500 mg total) by mouth 3 (three) times daily.    Dispense:  21 capsule    Refill:  0  new acute issue with medication management in high risk patient with recent UTI leading to sepsis. Also higher risk with age, diabetes, morbid obesity  Return precautions advised.  Garret Reddish, MD

## 2017-05-15 NOTE — Telephone Encounter (Signed)
Pt's sister Dahlia Byes called (on [place]) concerned over pt's cognition and pain levels.  Wants patient seen today by PCP.  No Available appointments, spoke to pcp, recommends that appointment be made for 05/19/17. Sister states concerns that pt cannot "make it" through the weekend.  Checked with pcp again and recommends urgent care if cannot wait.  Ms. Delynn Flavin then begins to talk about a UTI (which had not previously been mentioned) and how pt cannot wait with UTI until Tuesday.  I offer her an appointment Dr. Yong Channel at [facility] to eval UTI only.  Pt scheduled 05/19/17 2 pm with PCP to evaluate cognitive concerns.

## 2017-05-16 LAB — URINE CULTURE

## 2017-05-18 DIAGNOSIS — N390 Urinary tract infection, site not specified: Secondary | ICD-10-CM | POA: Diagnosis not present

## 2017-05-18 DIAGNOSIS — E119 Type 2 diabetes mellitus without complications: Secondary | ICD-10-CM | POA: Diagnosis not present

## 2017-05-18 DIAGNOSIS — Z6841 Body Mass Index (BMI) 40.0 and over, adult: Secondary | ICD-10-CM | POA: Diagnosis not present

## 2017-05-18 DIAGNOSIS — J4530 Mild persistent asthma, uncomplicated: Secondary | ICD-10-CM | POA: Diagnosis not present

## 2017-05-18 DIAGNOSIS — I10 Essential (primary) hypertension: Secondary | ICD-10-CM | POA: Diagnosis not present

## 2017-05-18 DIAGNOSIS — G9341 Metabolic encephalopathy: Secondary | ICD-10-CM | POA: Diagnosis not present

## 2017-05-18 DIAGNOSIS — G4733 Obstructive sleep apnea (adult) (pediatric): Secondary | ICD-10-CM | POA: Diagnosis not present

## 2017-05-18 DIAGNOSIS — D696 Thrombocytopenia, unspecified: Secondary | ICD-10-CM | POA: Diagnosis not present

## 2017-05-19 ENCOUNTER — Encounter: Payer: Self-pay | Admitting: Internal Medicine

## 2017-05-19 ENCOUNTER — Telehealth: Payer: Self-pay

## 2017-05-19 ENCOUNTER — Ambulatory Visit (INDEPENDENT_AMBULATORY_CARE_PROVIDER_SITE_OTHER)
Admission: RE | Admit: 2017-05-19 | Discharge: 2017-05-19 | Disposition: A | Discharge status: Home / Self Care | Payer: Medicare PPO | Source: Ambulatory Visit | Attending: Internal Medicine | Admitting: Internal Medicine

## 2017-05-19 ENCOUNTER — Ambulatory Visit (INDEPENDENT_AMBULATORY_CARE_PROVIDER_SITE_OTHER): Payer: Medicare PPO | Admitting: Internal Medicine

## 2017-05-19 VITALS — BP 118/66 | HR 71 | Temp 97.9°F | Wt 183.5 lb

## 2017-05-19 DIAGNOSIS — N390 Urinary tract infection, site not specified: Secondary | ICD-10-CM | POA: Diagnosis not present

## 2017-05-19 DIAGNOSIS — M545 Low back pain: Secondary | ICD-10-CM

## 2017-05-19 DIAGNOSIS — I10 Essential (primary) hypertension: Secondary | ICD-10-CM | POA: Diagnosis not present

## 2017-05-19 DIAGNOSIS — R4189 Other symptoms and signs involving cognitive functions and awareness: Secondary | ICD-10-CM

## 2017-05-19 DIAGNOSIS — D696 Thrombocytopenia, unspecified: Secondary | ICD-10-CM | POA: Diagnosis not present

## 2017-05-19 DIAGNOSIS — Z6841 Body Mass Index (BMI) 40.0 and over, adult: Secondary | ICD-10-CM | POA: Diagnosis not present

## 2017-05-19 DIAGNOSIS — J4530 Mild persistent asthma, uncomplicated: Secondary | ICD-10-CM | POA: Diagnosis not present

## 2017-05-19 DIAGNOSIS — G9341 Metabolic encephalopathy: Secondary | ICD-10-CM | POA: Diagnosis not present

## 2017-05-19 DIAGNOSIS — G4733 Obstructive sleep apnea (adult) (pediatric): Secondary | ICD-10-CM | POA: Diagnosis not present

## 2017-05-19 DIAGNOSIS — E119 Type 2 diabetes mellitus without complications: Secondary | ICD-10-CM | POA: Diagnosis not present

## 2017-05-19 MED ORDER — TRAMADOL HCL 50 MG PO TABS: 50.0000 mg | ORAL_TABLET | Freq: Three times a day (TID) | ORAL | 0 refills | Status: DC | PRN

## 2017-05-19 NOTE — Patient Instructions (Signed)
Mild Neurocognitive Disorder Mild neurocognitive disorder (formerly known as mild cognitive impairment) is a mental disorder. It is a slight abnormal decrease in mental function. The areas of mental function affected may include memory, thought, communication, behavior, and completion of tasks. The decrease is noticeable and measurable but for the most part does not interfere with your daily activities. Mild neurocognitive disorder typically occurs in people older than 60 years but can occur earlier. It is not as serious as major neurocognitive disorder (formerly known as dementia) but may lead to a more serious neurocognitive disorder. However, in some cases the condition does not get worse. A few people with this disorder even improve. What are the causes? There are a number of different causes of mild neurocognitive disorder:  Brain disorders associated with abnormal protein deposits, such as Alzheimer's disease, Pick's disease, and Lewy body disease.  Brain disorders associated with abnormal movement, such as Parkinson's disease and Huntington's disease.  Diseases affecting blood vessels in the brain and resulting in mini-strokes.  Certain infections, such as human immunodeficiency virus (HIV) infection.  Traumatic brain injury.  Other medical conditions such as brain tumors, underactive thyroid (hypothyroidism), and vitamin B12 deficiency.  Use of certain prescription medicine and "recreational" drugs.  What are the signs or symptoms? Symptoms of mild neurocognitive disorder include:  Difficulty remembering. You may forget details of recent events, names, or phone numbers. You may forget important social events and appointments or repeatedly forget where you put your car keys.  Difficulty thinking and solving problems. You may have trouble with complex tasks such as paying bills or driving in unfamiliar locations.  Difficulty communicating. You may have trouble finding the right word,  naming an object, forming a sentence that makes sense, or understanding what you read or hear.  Changes in your behavior or personality. You may lose interest in the things that you used to enjoy or withdraw from social situations. You may get angry more easily than usual. You may act before thinking. You may do things in public that you would not usually do. You may hear or see things that are not real (hallucinations). You may believe falsely that others are trying to hurt you (paranoia).  How is this diagnosed? Mild neurocognitive disorder is diagnosed through an assessment by your health care provider. Your health care provider will ask you and your family, friends, or coworkers questions about your symptoms. He or she will ask how often the symptoms occur, how long they have been occurring, whether they are getting worse, and the effect they are having on your life. Your health care provider may refer you to a neurologist or mental health specialist for a detailed evaluation of your mental functions (neuropsychological testing). To identify the cause of your mild neurocognitive disorder, your health care provider may:  Obtain a detailed medical history.  Ask about alcohol and drug use, including prescription medicine.  Perform a physical exam.  Order blood tests and brain imaging exams.  How is this treated? Mild neurocognitive disorder caused by infections, use of certain medicines or "recreational" drugs, and certain medical conditions may improve with treatment of the condition that is causing the disorder. Mild neurocognitive disorder resulting from other causes generally does not improve and may worsen. In these cases, the goal of treatment is to slow progression of the disorder and help you cope with the loss of mental function. Treatments in these cases include:  Medicine. Medicine helps mainly with memory loss and behavioral symptoms.  Talk therapy.   Talk therapy provides education,  emotional support, memory aids, and other ways of making up for decreases in mental function.  Lifestyle changes. These include regular exercise, a healthy diet (including essential omega-3 fatty acids), intellectual stimulation, and increased social interaction.  This information is not intended to replace advice given to you by your health care provider. Make sure you discuss any questions you have with your health care provider. Document Released: 08/10/2013 Document Revised: 05/15/2016 Document Reviewed: 05/02/2013 Elsevier Interactive Patient Education  2017 Elsevier Inc.  

## 2017-05-19 NOTE — Progress Notes (Signed)
Subjective:    Patient ID: Tiffany Arnold, female    DOB: December 31, 1940, 76 y.o.   MRN: [id number]  HPI  Pt presents to the clinic today, accompanied by her sister in law and daughter, with concerns for cognitive issues. Her daughterreports that she is extremely forgetful, has no short term memory. She has trouble finding the words that she wants to say. The pharmacy is starting to have to blister pack her medications. She has recently had some acute encephalopathy secondary to presumed UTI's. She has been on multiple antibiotics but that did not improve her cognitive issues. She had a CT scan of her head and MRI of the brain 04/2017 which both showed chronic small vessel ischemic changes. She did undergo chemo within the last year for breast cancer.  She also reports pain in her lower back. She is unsure how long this is going on. It seems worse when she sits for long periods of time or tries to get out of bed in the morning. Her daughter reports her mother screams at the top of her lungs in pain. She is unable to describe the pain or if there is numbness or tingling in her legs. She does have weakness and is currently working with PT. She is taking Aleve for the pain. She has Hydrocodone but does not want to take it because she is concerned it will worsen her cognitive issues.  Review of Systems  Past Medical History:  Diagnosis Date  . Allergy   . Arthritis   . Asthma   . Cancer ([place]) 07/29/2016   right breast  . Cataract of both eyes   . Chicken pox   . Colon polyps   . Dog bite of index finger 07/23/2016   Left index finger  . GERD (gastroesophageal reflux disease)   . History of radiation therapy 12/24/16- 02/03/17   Right Breast 50.4 Gy in 28 fractions, Right Axilla 45 Gy in 25 fractions.  . Hyperlipidemia   . Hypertension   . IBS (irritable bowel syndrome)   . Phlebitis   . Pneumonia    hx of several yrs ago  . Shortness of breath dyspnea    with exertion only  . Sleep apnea     wears CPAP machine nightly    Current Outpatient Prescriptions  Medication Sig Dispense Refill  . albuterol-ipratropium (COMBIVENT) 18-103 MCG/ACT inhaler Inhale 2 puffs into the lungs every 4 (four) hours as needed for wheezing or shortness of breath.     Marland Kitchen alendronate (FOSAMAX) 70 MG tablet Take 1 tablet (70 mg total) by mouth once a week. Take with a full glass of water on an empty stomach. 4 tablet 5  . ALPRAZolam (XANAX) 0.25 MG tablet TAKE ONE TABLET BY MOUTH TWICE DAILY AS NEEDED 60 tablet 0  . Calcium Carbonate-Vitamin D (CALCIUM-VITAMIN D) 500-200 MG-UNIT tablet Take 1 tablet by mouth daily.    . cephALEXin (KEFLEX) 500 MG capsule Take 1 capsule (500 mg total) by mouth 3 (three) times daily. 21 capsule 0  . cetirizine (ZYRTEC) 10 MG tablet Take 10 mg by mouth daily.    . Cholecalciferol (VITAMIN D3) 1000 units CAPS Take by mouth 2 (two) times daily.    . diphenoxylate-atropine (LOMOTIL) 2.5-0.025 MG tablet Take 2 tablets by mouth 4 (four) times daily as needed for diarrhea or loose stools. 30 tablet 0  . EPINEPHrine 0.3 mg/0.3 mL IJ SOAJ injection Inject 0.3 mg into the muscle once as needed (ALLERGIC REACTION).     Marland Kitchen  glipiZIDE (GLUCOTROL) 5 MG tablet TAKE ONE TABLET TWICE DAILY BEFORE A MEAL 90 tablet 0  . glucosamine-chondroitin 500-400 MG tablet Take 1 tablet by mouth 2 (two) times daily.    Marland Kitchen letrozole (FEMARA) 2.5 MG tablet Take 1 tablet (2.5 mg total) by mouth daily. 30 tablet 5  . Olmesartan-Amlodipine-HCTZ 40-5-25 MG TABS Take 1 tablet by mouth daily. 90 tablet 1  . oxybutynin (DITROPAN-XL) 10 MG 24 hr tablet Take 1 tablet (10 mg total) by mouth at bedtime. 90 tablet 1  . potassium chloride (K-DUR) 10 MEQ tablet Take 1 tablet (10 mEq total) by mouth 2 (two) times daily. 180 tablet 0  . pravastatin (PRAVACHOL) 40 MG tablet Take 1 tablet (40 mg total) by mouth daily. 90 tablet 1  . ranitidine (ZANTAC) 150 MG tablet Take 150 mg by mouth every evening.     . sertraline (ZOLOFT)  25 MG tablet TAKE ONE (1) TABLET EACH DAY 30 tablet 2  . vitamin B-12 (CYANOCOBALAMIN) 1000 MCG tablet Take 1,000 mcg by mouth daily.     No current facility-administered medications for this visit.     Allergies  Allergen Reactions  . Other Swelling and Shortness Of Breath    Cats, dogs, mold Induced asthma Cats, dogs, mold    Family History  Problem Relation Age of Onset  . Arthritis Mother   . Stroke Mother   . Hypertension Mother   . Cancer Father        Prostate  . Stroke Father   . Hypertension Father   . Hypertension Maternal Grandmother   . Rheum arthritis Maternal Grandfather   . Stroke Maternal Grandfather   . Hypertension Maternal Grandfather   . Cancer Paternal Grandmother        Colon  . Hypertension Paternal Grandmother   . Hypertension Paternal Grandfather   . Breast cancer Neg Hx     Social History   Social History  . Marital status: Married    Spouse name: N/A  . Number of children: N/A  . Years of education: N/A   Occupational History  . Not on file.   Social History Main Topics  . Smoking status: Former Smoker    Packs/day: 2.00    Years: 25.00    Quit date: 12/22/1990  . Smokeless tobacco: Never Used     Comment: quit 24 years ago  . Alcohol use Yes     Comment: rare  . Drug use: No  . Sexual activity: Not Currently   Other Topics Concern  . Not on file   Social History Narrative  . No narrative on file     Constitutional: Pt reports weight loss. Denies fever, malaise, fatigue, headache.  Musculoskeletal: Pt reports low back pain. Denies decrease in range of motion,  muscle pain or joint swelling.  Neurological: Pt reports difficulty with memory. Denies dizziness.    No other specific complaints in a complete review of systems (except as listed in HPI above).     Objective:   Physical Exam BP 118/66   Pulse 71   Temp 97.9 F (36.6 C) (Oral)   Wt 183 lb 8 oz (83.2 kg)   SpO2 96%   BMI 35.84 kg/m  Wt Readings from Last  3 Encounters:  05/19/17 183 lb 8 oz (83.2 kg)  05/15/17 189 lb 12.8 oz (86.1 kg)  05/04/17 196 lb 12 oz (89.2 kg)    General: Appears her stated age, chronically ill appearing, in NAD. Musculoskeletal: Unable to perform  flexion of the spine, secondary to unsteadiness. Normal rotation. Pain with palpation over the lumbar spine. Gait slow but steady with the use of the rolling walker. Neurological: MMSE 18/30.  Psychiatric: Mood and affect flat.     BMET    Component Value Date/Time   NA 135 04/30/2017 0353   NA 141 04/01/2017 1003   K 3.5 04/30/2017 0353   K 3.7 04/01/2017 1003   CL 103 04/30/2017 0353   CO2 25 04/30/2017 0353   CO2 25 04/01/2017 1003   GLUCOSE 133 (H) 04/30/2017 0353   GLUCOSE 62 (L) 04/01/2017 1003   BUN 40 (H) 04/30/2017 0353   BUN 18.6 04/01/2017 1003   CREATININE 0.68 04/30/2017 0353   CREATININE 0.7 04/01/2017 1003   CALCIUM 9.0 04/30/2017 0353   CALCIUM 10.4 04/01/2017 1003   GFRNONAA >60 04/30/2017 0353   GFRAA >60 04/30/2017 0353    Lipid Panel     Component Value Date/Time   CHOL 155 03/05/2017 1058   TRIG 94.0 03/05/2017 1058   HDL 45.50 03/05/2017 1058   CHOLHDL 3 03/05/2017 1058   VLDL 18.8 03/05/2017 1058   LDLCALC 91 03/05/2017 1058    CBC    Component Value Date/Time   WBC 7.7 04/30/2017 0353   RBC 3.94 04/30/2017 0353   HGB 10.9 (L) 04/30/2017 0353   HGB 11.6 04/01/2017 1003   HCT 33.1 (L) 04/30/2017 0353   HCT 34.8 04/01/2017 1003   PLT 45 (L) 04/30/2017 0353   PLT 263 04/01/2017 1003   MCV 84.0 04/30/2017 0353   MCV 84.7 04/01/2017 1003   MCH 27.7 04/30/2017 0353   MCHC 32.9 04/30/2017 0353   RDW 15.8 (H) 04/30/2017 0353   RDW 15.7 (H) 04/01/2017 1003   LYMPHSABS 0.9 04/30/2017 0353   LYMPHSABS 1.3 04/01/2017 1003   MONOABS 1.3 (H) 04/30/2017 0353   MONOABS 0.5 04/01/2017 1003   EOSABS 0.1 04/30/2017 0353   EOSABS 0.1 04/01/2017 1003   BASOSABS 0.0 04/30/2017 0353   BASOSABS 0.0 04/01/2017 1003    Hgb A1C Lab  Results  Component Value Date   HGBA1C 5.5 03/05/2017             Assessment & Plan:   Cognitive Impairment:  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

## 2017-05-19 NOTE — Telephone Encounter (Signed)
I just did cognitive eval, referring to neurology as well. [place] for speech therapy and Education officer, museum

## 2017-05-19 NOTE — Telephone Encounter (Signed)
Spoke to [place] and verbal order given

## 2017-05-19 NOTE — Telephone Encounter (Signed)
Izora Gala with OT [facility] left v/m requesting verbal order for [facility] speech therapy evaluation and  for [facility] social worker to discuss community resources for pt care. Izora Gala has concerns that pt has difficulty finding words; pt having problem describing her pain and also difficulty getting in and out of bed last night. Izora Gala will do cognitive eval next visit with pt. Izora Gala request cb.

## 2017-05-21 ENCOUNTER — Other Ambulatory Visit: Payer: Self-pay | Admitting: Internal Medicine

## 2017-05-21 ENCOUNTER — Telehealth: Payer: Self-pay | Admitting: Internal Medicine

## 2017-05-21 ENCOUNTER — Telehealth: Payer: Self-pay

## 2017-05-21 DIAGNOSIS — E119 Type 2 diabetes mellitus without complications: Secondary | ICD-10-CM | POA: Diagnosis not present

## 2017-05-21 DIAGNOSIS — Z6841 Body Mass Index (BMI) 40.0 and over, adult: Secondary | ICD-10-CM | POA: Diagnosis not present

## 2017-05-21 DIAGNOSIS — D696 Thrombocytopenia, unspecified: Secondary | ICD-10-CM | POA: Diagnosis not present

## 2017-05-21 DIAGNOSIS — G9341 Metabolic encephalopathy: Secondary | ICD-10-CM | POA: Diagnosis not present

## 2017-05-21 DIAGNOSIS — N390 Urinary tract infection, site not specified: Secondary | ICD-10-CM | POA: Diagnosis not present

## 2017-05-21 DIAGNOSIS — J4530 Mild persistent asthma, uncomplicated: Secondary | ICD-10-CM | POA: Diagnosis not present

## 2017-05-21 DIAGNOSIS — I10 Essential (primary) hypertension: Secondary | ICD-10-CM | POA: Diagnosis not present

## 2017-05-21 DIAGNOSIS — G4733 Obstructive sleep apnea (adult) (pediatric): Secondary | ICD-10-CM | POA: Diagnosis not present

## 2017-05-21 NOTE — Telephone Encounter (Signed)
Merichelle at [facility] request printed med list for pt; Merichelle will review med list and fax back the OTC meds that will need printed order. [facility] is fixing med boxes for pt. Pt request the med boxes be ready today for pick up.[place] request cb when done. FYI to Avie Echevaria NP and [facility] CMA.

## 2017-05-21 NOTE — Telephone Encounter (Signed)
Patient returned call about xray results  

## 2017-05-21 NOTE — Telephone Encounter (Signed)
Did Mearl Latin print it out and fax it to [place]?

## 2017-05-21 NOTE — Telephone Encounter (Signed)
I apologize I did fax the med list to [place] atten to [place] at fax # [phone].

## 2017-05-22 DIAGNOSIS — I10 Essential (primary) hypertension: Secondary | ICD-10-CM | POA: Diagnosis not present

## 2017-05-22 DIAGNOSIS — N390 Urinary tract infection, site not specified: Secondary | ICD-10-CM | POA: Diagnosis not present

## 2017-05-22 DIAGNOSIS — G4733 Obstructive sleep apnea (adult) (pediatric): Secondary | ICD-10-CM | POA: Diagnosis not present

## 2017-05-22 DIAGNOSIS — J4530 Mild persistent asthma, uncomplicated: Secondary | ICD-10-CM | POA: Diagnosis not present

## 2017-05-22 DIAGNOSIS — E119 Type 2 diabetes mellitus without complications: Secondary | ICD-10-CM | POA: Diagnosis not present

## 2017-05-22 DIAGNOSIS — D696 Thrombocytopenia, unspecified: Secondary | ICD-10-CM | POA: Diagnosis not present

## 2017-05-22 DIAGNOSIS — Z6841 Body Mass Index (BMI) 40.0 and over, adult: Secondary | ICD-10-CM | POA: Diagnosis not present

## 2017-05-22 DIAGNOSIS — G9341 Metabolic encephalopathy: Secondary | ICD-10-CM | POA: Diagnosis not present

## 2017-05-25 DIAGNOSIS — J4530 Mild persistent asthma, uncomplicated: Secondary | ICD-10-CM | POA: Diagnosis not present

## 2017-05-25 DIAGNOSIS — E119 Type 2 diabetes mellitus without complications: Secondary | ICD-10-CM | POA: Diagnosis not present

## 2017-05-25 DIAGNOSIS — G9341 Metabolic encephalopathy: Secondary | ICD-10-CM | POA: Diagnosis not present

## 2017-05-25 DIAGNOSIS — D696 Thrombocytopenia, unspecified: Secondary | ICD-10-CM | POA: Diagnosis not present

## 2017-05-25 DIAGNOSIS — Z6841 Body Mass Index (BMI) 40.0 and over, adult: Secondary | ICD-10-CM | POA: Diagnosis not present

## 2017-05-25 DIAGNOSIS — N390 Urinary tract infection, site not specified: Secondary | ICD-10-CM | POA: Diagnosis not present

## 2017-05-25 DIAGNOSIS — G4733 Obstructive sleep apnea (adult) (pediatric): Secondary | ICD-10-CM | POA: Diagnosis not present

## 2017-05-25 DIAGNOSIS — I10 Essential (primary) hypertension: Secondary | ICD-10-CM | POA: Diagnosis not present

## 2017-05-26 ENCOUNTER — Telehealth: Payer: Self-pay

## 2017-05-26 ENCOUNTER — Telehealth: Payer: Self-pay | Admitting: *Deleted

## 2017-05-26 DIAGNOSIS — N390 Urinary tract infection, site not specified: Secondary | ICD-10-CM | POA: Diagnosis not present

## 2017-05-26 DIAGNOSIS — I10 Essential (primary) hypertension: Secondary | ICD-10-CM | POA: Diagnosis not present

## 2017-05-26 DIAGNOSIS — D696 Thrombocytopenia, unspecified: Secondary | ICD-10-CM | POA: Diagnosis not present

## 2017-05-26 DIAGNOSIS — J4530 Mild persistent asthma, uncomplicated: Secondary | ICD-10-CM | POA: Diagnosis not present

## 2017-05-26 DIAGNOSIS — E119 Type 2 diabetes mellitus without complications: Secondary | ICD-10-CM | POA: Diagnosis not present

## 2017-05-26 DIAGNOSIS — Z6841 Body Mass Index (BMI) 40.0 and over, adult: Secondary | ICD-10-CM | POA: Diagnosis not present

## 2017-05-26 DIAGNOSIS — G9341 Metabolic encephalopathy: Secondary | ICD-10-CM | POA: Diagnosis not present

## 2017-05-26 DIAGNOSIS — G4733 Obstructive sleep apnea (adult) (pediatric): Secondary | ICD-10-CM | POA: Diagnosis not present

## 2017-05-26 NOTE — Telephone Encounter (Signed)
Amy speech therapist with [facility] called stating that she has been out with patient today and there have been changes since she got there over an hour ago. Amy stated that patient went from being very talkative and now is having a hard time completing sentences. Amy stated that she did not eat breakfast, but took her morning pills. Amy stated that patient's heart rate is 66/regular and BP 130/68. She stated that patient is able to move both arms and did drink some orange juice, but no change since orange juice. Amy stated that she is responsive, only answers yes and no questions.

## 2017-05-26 NOTE — Telephone Encounter (Signed)
I called Amy back and she reports pt's lethargic Sx improved since drinking orange juice and is currently eating a [place] sandwich... Amy states that pt took meds including glipizide at 9:30am and she arrived around 10am. She believes her Sx to be related to hypoglycemia... Please advise

## 2017-05-26 NOTE — Telephone Encounter (Signed)
Left detailed msg on VM per HIPAA  

## 2017-05-26 NOTE — Telephone Encounter (Signed)
I spoke to [place], ok per DPR... She states she did not have a glucometer but has since purchased one. I let her know that in the meantime, it may be best to have a glass of orange juice ready in the AM after she takes her meds... Also to check BG in AM before meds and food also about 2 hours before dinner... She states she is not usually there in the AM and pt's husband is not able... Pt will be staying with her daughter coming up for 6 days and states they will be able to help with checking BG during that time to be able to provide you with some BG numbers... Please advise

## 2017-05-26 NOTE — Telephone Encounter (Signed)
Has anyone checked her sugar?

## 2017-05-26 NOTE — Telephone Encounter (Signed)
[place] occupational therapist from [place] care left a vm requesting verbal orders to extend pt's occupational therapy due to slow progress related to pt's cognitive deficit.  Twice week one Once week 2 Once week 3

## 2017-05-27 DIAGNOSIS — I10 Essential (primary) hypertension: Secondary | ICD-10-CM | POA: Diagnosis not present

## 2017-05-27 DIAGNOSIS — Z6841 Body Mass Index (BMI) 40.0 and over, adult: Secondary | ICD-10-CM | POA: Diagnosis not present

## 2017-05-27 DIAGNOSIS — E119 Type 2 diabetes mellitus without complications: Secondary | ICD-10-CM | POA: Diagnosis not present

## 2017-05-27 DIAGNOSIS — G9341 Metabolic encephalopathy: Secondary | ICD-10-CM | POA: Diagnosis not present

## 2017-05-27 DIAGNOSIS — G4733 Obstructive sleep apnea (adult) (pediatric): Secondary | ICD-10-CM | POA: Diagnosis not present

## 2017-05-27 DIAGNOSIS — N390 Urinary tract infection, site not specified: Secondary | ICD-10-CM | POA: Diagnosis not present

## 2017-05-27 DIAGNOSIS — D696 Thrombocytopenia, unspecified: Secondary | ICD-10-CM | POA: Diagnosis not present

## 2017-05-27 DIAGNOSIS — J4530 Mild persistent asthma, uncomplicated: Secondary | ICD-10-CM | POA: Diagnosis not present

## 2017-05-27 NOTE — Telephone Encounter (Signed)
Ok to extend OT 

## 2017-05-27 NOTE — Telephone Encounter (Signed)
Agree with advise given. We also need to make sure she is eating 6 small meals a day and not skipping meals.

## 2017-05-27 NOTE — Telephone Encounter (Signed)
We need to stop Glipizide, recheck A1C in 4 weeks

## 2017-05-27 NOTE — Telephone Encounter (Signed)
Message left at triage, but person calling did not provide their contact information. Caller states she has been in contact with [place] [facility] [facility] regarding pt. Pt experienced a drop in glucose levels and when caller arrived, pt was unresponsive and EMS was called. Upon their arrival, pts glucose was checked and was 22. Caller is requesting a call back at [phone] to discuss what should be done next and if pts medications are needing to be changed for better management. pls advise

## 2017-05-27 NOTE — Telephone Encounter (Signed)
I spoke to [place]... Ok per [employer]--- and she is aware to stop meds... Pt already has an appt for July

## 2017-05-28 DIAGNOSIS — N390 Urinary tract infection, site not specified: Secondary | ICD-10-CM | POA: Diagnosis not present

## 2017-05-28 DIAGNOSIS — D696 Thrombocytopenia, unspecified: Secondary | ICD-10-CM | POA: Diagnosis not present

## 2017-05-28 DIAGNOSIS — Z6841 Body Mass Index (BMI) 40.0 and over, adult: Secondary | ICD-10-CM | POA: Diagnosis not present

## 2017-05-28 DIAGNOSIS — G9341 Metabolic encephalopathy: Secondary | ICD-10-CM | POA: Diagnosis not present

## 2017-05-28 DIAGNOSIS — I10 Essential (primary) hypertension: Secondary | ICD-10-CM | POA: Diagnosis not present

## 2017-05-28 DIAGNOSIS — E119 Type 2 diabetes mellitus without complications: Secondary | ICD-10-CM | POA: Diagnosis not present

## 2017-05-28 DIAGNOSIS — G4733 Obstructive sleep apnea (adult) (pediatric): Secondary | ICD-10-CM | POA: Diagnosis not present

## 2017-05-28 DIAGNOSIS — J4530 Mild persistent asthma, uncomplicated: Secondary | ICD-10-CM | POA: Diagnosis not present

## 2017-05-28 NOTE — Telephone Encounter (Signed)
Tiffany Arnold was given verbal order per as instructed

## 2017-05-29 ENCOUNTER — Telehealth: Payer: Self-pay

## 2017-05-29 NOTE — Telephone Encounter (Signed)
Tiffany Arnold said she's pt's daughter (not on DPR) left a vm requesting a call back "in reference of pt's pain managent and to acquire a hospital bed".

## 2017-06-01 DIAGNOSIS — J4530 Mild persistent asthma, uncomplicated: Secondary | ICD-10-CM | POA: Diagnosis not present

## 2017-06-01 DIAGNOSIS — E119 Type 2 diabetes mellitus without complications: Secondary | ICD-10-CM | POA: Diagnosis not present

## 2017-06-01 DIAGNOSIS — G4733 Obstructive sleep apnea (adult) (pediatric): Secondary | ICD-10-CM | POA: Diagnosis not present

## 2017-06-01 DIAGNOSIS — Z6841 Body Mass Index (BMI) 40.0 and over, adult: Secondary | ICD-10-CM | POA: Diagnosis not present

## 2017-06-01 DIAGNOSIS — I10 Essential (primary) hypertension: Secondary | ICD-10-CM | POA: Diagnosis not present

## 2017-06-01 DIAGNOSIS — G9341 Metabolic encephalopathy: Secondary | ICD-10-CM | POA: Diagnosis not present

## 2017-06-01 DIAGNOSIS — D696 Thrombocytopenia, unspecified: Secondary | ICD-10-CM | POA: Diagnosis not present

## 2017-06-01 DIAGNOSIS — N390 Urinary tract infection, site not specified: Secondary | ICD-10-CM | POA: Diagnosis not present

## 2017-06-01 NOTE — Telephone Encounter (Signed)
Spoke to pts sister who is not on pts DPR. Per Rollene Fare, ok to advise her that pt is needing OV. Upcoming appt 6/22

## 2017-06-01 NOTE — Telephone Encounter (Signed)
They can make an appt to discuss

## 2017-06-03 ENCOUNTER — Other Ambulatory Visit: Payer: Self-pay | Admitting: Internal Medicine

## 2017-06-03 DIAGNOSIS — J4530 Mild persistent asthma, uncomplicated: Secondary | ICD-10-CM | POA: Diagnosis not present

## 2017-06-03 DIAGNOSIS — D696 Thrombocytopenia, unspecified: Secondary | ICD-10-CM | POA: Diagnosis not present

## 2017-06-03 DIAGNOSIS — Z6841 Body Mass Index (BMI) 40.0 and over, adult: Secondary | ICD-10-CM | POA: Diagnosis not present

## 2017-06-03 DIAGNOSIS — G9341 Metabolic encephalopathy: Secondary | ICD-10-CM | POA: Diagnosis not present

## 2017-06-03 DIAGNOSIS — F411 Generalized anxiety disorder: Secondary | ICD-10-CM

## 2017-06-03 DIAGNOSIS — G4733 Obstructive sleep apnea (adult) (pediatric): Secondary | ICD-10-CM | POA: Diagnosis not present

## 2017-06-03 DIAGNOSIS — N390 Urinary tract infection, site not specified: Secondary | ICD-10-CM | POA: Diagnosis not present

## 2017-06-03 DIAGNOSIS — E119 Type 2 diabetes mellitus without complications: Secondary | ICD-10-CM | POA: Diagnosis not present

## 2017-06-03 DIAGNOSIS — I10 Essential (primary) hypertension: Secondary | ICD-10-CM | POA: Diagnosis not present

## 2017-06-03 DIAGNOSIS — R269 Unspecified abnormalities of gait and mobility: Secondary | ICD-10-CM | POA: Diagnosis not present

## 2017-06-04 DIAGNOSIS — G9341 Metabolic encephalopathy: Secondary | ICD-10-CM | POA: Diagnosis not present

## 2017-06-04 DIAGNOSIS — D696 Thrombocytopenia, unspecified: Secondary | ICD-10-CM | POA: Diagnosis not present

## 2017-06-04 DIAGNOSIS — E119 Type 2 diabetes mellitus without complications: Secondary | ICD-10-CM | POA: Diagnosis not present

## 2017-06-04 DIAGNOSIS — I10 Essential (primary) hypertension: Secondary | ICD-10-CM | POA: Diagnosis not present

## 2017-06-04 DIAGNOSIS — N390 Urinary tract infection, site not specified: Secondary | ICD-10-CM | POA: Diagnosis not present

## 2017-06-04 DIAGNOSIS — J4530 Mild persistent asthma, uncomplicated: Secondary | ICD-10-CM | POA: Diagnosis not present

## 2017-06-04 DIAGNOSIS — Z6841 Body Mass Index (BMI) 40.0 and over, adult: Secondary | ICD-10-CM | POA: Diagnosis not present

## 2017-06-04 DIAGNOSIS — G4733 Obstructive sleep apnea (adult) (pediatric): Secondary | ICD-10-CM | POA: Diagnosis not present

## 2017-06-04 NOTE — Telephone Encounter (Signed)
Last filled 02/2017... Please advise

## 2017-06-07 DIAGNOSIS — G4733 Obstructive sleep apnea (adult) (pediatric): Secondary | ICD-10-CM | POA: Diagnosis not present

## 2017-06-08 DIAGNOSIS — G9341 Metabolic encephalopathy: Secondary | ICD-10-CM | POA: Diagnosis not present

## 2017-06-08 DIAGNOSIS — I10 Essential (primary) hypertension: Secondary | ICD-10-CM | POA: Diagnosis not present

## 2017-06-08 DIAGNOSIS — J4530 Mild persistent asthma, uncomplicated: Secondary | ICD-10-CM | POA: Diagnosis not present

## 2017-06-08 DIAGNOSIS — G4733 Obstructive sleep apnea (adult) (pediatric): Secondary | ICD-10-CM | POA: Diagnosis not present

## 2017-06-08 DIAGNOSIS — N390 Urinary tract infection, site not specified: Secondary | ICD-10-CM | POA: Diagnosis not present

## 2017-06-08 DIAGNOSIS — E119 Type 2 diabetes mellitus without complications: Secondary | ICD-10-CM | POA: Diagnosis not present

## 2017-06-08 DIAGNOSIS — Z6841 Body Mass Index (BMI) 40.0 and over, adult: Secondary | ICD-10-CM | POA: Diagnosis not present

## 2017-06-08 DIAGNOSIS — D696 Thrombocytopenia, unspecified: Secondary | ICD-10-CM | POA: Diagnosis not present

## 2017-06-09 DIAGNOSIS — Z6841 Body Mass Index (BMI) 40.0 and over, adult: Secondary | ICD-10-CM | POA: Diagnosis not present

## 2017-06-09 DIAGNOSIS — G9341 Metabolic encephalopathy: Secondary | ICD-10-CM | POA: Diagnosis not present

## 2017-06-09 DIAGNOSIS — N390 Urinary tract infection, site not specified: Secondary | ICD-10-CM | POA: Diagnosis not present

## 2017-06-09 DIAGNOSIS — G4733 Obstructive sleep apnea (adult) (pediatric): Secondary | ICD-10-CM | POA: Diagnosis not present

## 2017-06-09 DIAGNOSIS — E119 Type 2 diabetes mellitus without complications: Secondary | ICD-10-CM | POA: Diagnosis not present

## 2017-06-09 DIAGNOSIS — I10 Essential (primary) hypertension: Secondary | ICD-10-CM | POA: Diagnosis not present

## 2017-06-09 DIAGNOSIS — D696 Thrombocytopenia, unspecified: Secondary | ICD-10-CM | POA: Diagnosis not present

## 2017-06-09 DIAGNOSIS — J4530 Mild persistent asthma, uncomplicated: Secondary | ICD-10-CM | POA: Diagnosis not present

## 2017-06-11 DIAGNOSIS — Z6841 Body Mass Index (BMI) 40.0 and over, adult: Secondary | ICD-10-CM | POA: Diagnosis not present

## 2017-06-11 DIAGNOSIS — N390 Urinary tract infection, site not specified: Secondary | ICD-10-CM | POA: Diagnosis not present

## 2017-06-11 DIAGNOSIS — E119 Type 2 diabetes mellitus without complications: Secondary | ICD-10-CM | POA: Diagnosis not present

## 2017-06-11 DIAGNOSIS — J4530 Mild persistent asthma, uncomplicated: Secondary | ICD-10-CM | POA: Diagnosis not present

## 2017-06-11 DIAGNOSIS — G9341 Metabolic encephalopathy: Secondary | ICD-10-CM | POA: Diagnosis not present

## 2017-06-11 DIAGNOSIS — D696 Thrombocytopenia, unspecified: Secondary | ICD-10-CM | POA: Diagnosis not present

## 2017-06-11 DIAGNOSIS — I10 Essential (primary) hypertension: Secondary | ICD-10-CM | POA: Diagnosis not present

## 2017-06-11 DIAGNOSIS — G4733 Obstructive sleep apnea (adult) (pediatric): Secondary | ICD-10-CM | POA: Diagnosis not present

## 2017-06-12 ENCOUNTER — Ambulatory Visit (INDEPENDENT_AMBULATORY_CARE_PROVIDER_SITE_OTHER): Payer: Medicare PPO | Admitting: Internal Medicine

## 2017-06-12 ENCOUNTER — Encounter: Payer: Self-pay | Admitting: Internal Medicine

## 2017-06-12 ENCOUNTER — Encounter: Payer: Medicare PPO | Admitting: Adult Health

## 2017-06-12 ENCOUNTER — Ambulatory Visit: Payer: Medicare PPO | Admitting: Internal Medicine

## 2017-06-12 VITALS — BP 116/78 | HR 75 | Temp 97.9°F | Wt 178.2 lb

## 2017-06-12 DIAGNOSIS — F0390 Unspecified dementia without behavioral disturbance: Secondary | ICD-10-CM | POA: Diagnosis not present

## 2017-06-12 DIAGNOSIS — R748 Abnormal levels of other serum enzymes: Secondary | ICD-10-CM | POA: Diagnosis not present

## 2017-06-12 DIAGNOSIS — R269 Unspecified abnormalities of gait and mobility: Secondary | ICD-10-CM

## 2017-06-12 DIAGNOSIS — Z79899 Other long term (current) drug therapy: Secondary | ICD-10-CM

## 2017-06-12 LAB — COMPREHENSIVE METABOLIC PANEL
ALT: 15 U/L (ref 0–35)
AST: 16 U/L (ref 0–37)
Albumin: 3 g/dL — ABNORMAL LOW (ref 3.5–5.2)
Alkaline Phosphatase: 150 U/L — ABNORMAL HIGH (ref 39–117)
BUN: 21 mg/dL (ref 6–23)
CHLORIDE: 99 meq/L (ref 96–112)
CO2: 33 meq/L — AB (ref 19–32)
CREATININE: 0.62 mg/dL (ref 0.40–1.20)
Calcium: 10.6 mg/dL — ABNORMAL HIGH (ref 8.4–10.5)
GFR: 100.13 mL/min (ref >60.00)
GLUCOSE: 100 mg/dL — AB (ref 70–99)
POTASSIUM: 4.7 meq/L (ref 3.5–5.1)
Sodium: 138 mEq/L (ref 135–145)
Total Bilirubin: 0.5 mg/dL (ref 0.2–1.2)
Total Protein: 7.1 g/dL (ref 6.0–8.3)

## 2017-06-12 NOTE — Patient Instructions (Signed)

## 2017-06-12 NOTE — Progress Notes (Signed)
Subjective:    Patient ID: Tiffany Arnold, female    DOB: 1941-03-29, 76 y.o.   MRN: [id number]  HPI  Pt presents to the clinic today with multiple concerns.  She is requesting an order for a hospital bed. She has been having a lot of issues with back pain. Xray of the lumbar spine showed slipped disc at L3 and arthritis of the lumbar spine. At one point, she was taking much more Tylenol than recommended due to confusion. Her liver enzymes were elevated during her hospital admission. She had not taken any Tylenol since that time. She has been taking Tramadol 50 mg BID with good relief. Her pain seem worse, first thing in the morning when she wakes up to get out of bed. She also has a hard time getting into her bed because it is so high. Her daughter is paying for a hospital bed out of her pocket at this time and they would like to get a RX for a hospital bed.  They also want to do a thorough medication review and get rid of any medications that may be causing her confusion. She has an appt with neurology on July 2nd to review cognitive issues.  Review of Systems      Past Medical History:  Diagnosis Date  . Allergy   . Arthritis   . Asthma   . Cancer ([place]) 07/29/2016   right breast  . Cataract of both eyes   . Chicken pox   . Colon polyps   . Dog bite of index finger 07/23/2016   Left index finger  . GERD (gastroesophageal reflux disease)   . History of radiation therapy 12/24/16- 02/03/17   Right Breast 50.4 Gy in 28 fractions, Right Axilla 45 Gy in 25 fractions.  . Hyperlipidemia   . Hypertension   . IBS (irritable bowel syndrome)   . Phlebitis   . Pneumonia    hx of several yrs ago  . Shortness of breath dyspnea    with exertion only  . Sleep apnea    wears CPAP machine nightly    Current Outpatient Prescriptions  Medication Sig Dispense Refill  . albuterol-ipratropium (COMBIVENT) 18-103 MCG/ACT inhaler Inhale 2 puffs into the lungs every 4 (four) hours as needed for  wheezing or shortness of breath.     Marland Kitchen alendronate (FOSAMAX) 70 MG tablet Take 1 tablet (70 mg total) by mouth once a week. Take with a full glass of water on an empty stomach. 4 tablet 5  . ALPRAZolam (XANAX) 0.25 MG tablet TAKE ONE TABLET BY MOUTH TWICE DAILY AS NEEDED 60 tablet 0  . Calcium Carbonate-Vitamin D (CALCIUM-VITAMIN D) 500-200 MG-UNIT tablet Take 1 tablet by mouth daily.    . cetirizine (ZYRTEC) 10 MG tablet Take 10 mg by mouth daily.    . Cholecalciferol (VITAMIN D3) 1000 units CAPS Take by mouth 2 (two) times daily.    . diphenoxylate-atropine (LOMOTIL) 2.5-0.025 MG tablet Take 2 tablets by mouth 4 (four) times daily as needed for diarrhea or loose stools. 30 tablet 0  . EPINEPHrine 0.3 mg/0.3 mL IJ SOAJ injection Inject 0.3 mg into the muscle once as needed (ALLERGIC REACTION).     Marland Kitchen glucosamine-chondroitin 500-400 MG tablet Take 1 tablet by mouth 2 (two) times daily.    Marland Kitchen letrozole (FEMARA) 2.5 MG tablet Take 1 tablet (2.5 mg total) by mouth daily. 30 tablet 5  . naproxen sodium (ANAPROX) 220 MG tablet Take 220 mg by mouth 2 (two)  times daily with a meal.    . Olmesartan-Amlodipine-HCTZ 40-5-25 MG TABS Take 1 tablet by mouth daily. 90 tablet 1  . oxybutynin (DITROPAN-XL) 10 MG 24 hr tablet Take 1 tablet (10 mg total) by mouth at bedtime. 90 tablet 1  . potassium chloride (K-DUR) 10 MEQ tablet Take 1 tablet (10 mEq total) by mouth 2 (two) times daily. 180 tablet 0  . pravastatin (PRAVACHOL) 40 MG tablet Take 1 tablet (40 mg total) by mouth daily. 90 tablet 1  . ranitidine (ZANTAC) 150 MG tablet Take 150 mg by mouth every evening.     . sertraline (ZOLOFT) 25 MG tablet TAKE ONE (1) TABLET EACH DAY 90 tablet 0  . traMADol (ULTRAM) 50 MG tablet Take 1 tablet (50 mg total) by mouth every 8 (eight) hours as needed. 90 tablet 0  . vitamin B-12 (CYANOCOBALAMIN) 1000 MCG tablet Take 1,000 mcg by mouth daily.     No current facility-administered medications for this visit.      Allergies  Allergen Reactions  . Other Swelling and Shortness Of Breath    Cats, dogs, mold Induced asthma Cats, dogs, mold    Family History  Problem Relation Age of Onset  . Arthritis Mother   . Stroke Mother   . Hypertension Mother   . Cancer Father        Prostate  . Stroke Father   . Hypertension Father   . Hypertension Maternal Grandmother   . Rheum arthritis Maternal Grandfather   . Stroke Maternal Grandfather   . Hypertension Maternal Grandfather   . Cancer Paternal Grandmother        Colon  . Hypertension Paternal Grandmother   . Hypertension Paternal Grandfather   . Breast cancer Neg Hx     Social History   Social History  . Marital status: Married    Spouse name: N/A  . Number of children: N/A  . Years of education: N/A   Occupational History  . Not on file.   Social History Main Topics  . Smoking status: Former Smoker    Packs/day: 2.00    Years: 25.00    Quit date: 12/22/1990  . Smokeless tobacco: Never Used     Comment: quit 24 years ago  . Alcohol use Yes     Comment: rare  . Drug use: No  . Sexual activity: Not Currently   Other Topics Concern  . Not on file   Social History Narrative  . No narrative on file     Constitutional: Denies fever, malaise, fatigue, headache or abrupt weight changes.  Musculoskeletal: Pt reports back pain. Denies decrease in range of motion, difficulty with gait, muscle pain or joint swelling.   Neurological: Pt reports difficulty with memory and confusion. Denies dizziness, difficulty with speech or problems with balance and coordination.  Psych: Pt reports anxiety. Denies depression, SI/HI.  No other specific complaints in a complete review of systems (except as listed in HPI above).  Objective:   Physical Exam BP 116/78   Pulse 75   Temp 97.9 F (36.6 C) (Oral)   Wt 178 lb 4 oz (80.9 kg)   SpO2 96%   BMI 34.81 kg/m  Wt Readings from Last 3 Encounters:  06/12/17 178 lb 4 oz (80.9 kg)   05/19/17 183 lb 8 oz (83.2 kg)  05/15/17 189 lb 12.8 oz (86.1 kg)    General: Appears her stated age, chronically ill appearing, in NAD. Musculoskeletal: Pain with flexion. Normal extension and rotation. Pain  with palpation over the lumbar spine. Gait slow but steady with the use of a rolling walker. Neurological: Alert to person and place. Psychiatric: Mood and affect flat.  BMET    Component Value Date/Time   NA 135 04/30/2017 0353   NA 141 04/01/2017 1003   K 3.5 04/30/2017 0353   K 3.7 04/01/2017 1003   CL 103 04/30/2017 0353   CO2 25 04/30/2017 0353   CO2 25 04/01/2017 1003   GLUCOSE 133 (H) 04/30/2017 0353   GLUCOSE 62 (L) 04/01/2017 1003   BUN 40 (H) 04/30/2017 0353   BUN 18.6 04/01/2017 1003   CREATININE 0.68 04/30/2017 0353   CREATININE 0.7 04/01/2017 1003   CALCIUM 9.0 04/30/2017 0353   CALCIUM 10.4 04/01/2017 1003   GFRNONAA >60 04/30/2017 0353   GFRAA >60 04/30/2017 0353    Lipid Panel     Component Value Date/Time   CHOL 155 03/05/2017 1058   TRIG 94.0 03/05/2017 1058   HDL 45.50 03/05/2017 1058   CHOLHDL 3 03/05/2017 1058   VLDL 18.8 03/05/2017 1058   LDLCALC 91 03/05/2017 1058    CBC    Component Value Date/Time   WBC 7.7 04/30/2017 0353   RBC 3.94 04/30/2017 0353   HGB 10.9 (L) 04/30/2017 0353   HGB 11.6 04/01/2017 1003   HCT 33.1 (L) 04/30/2017 0353   HCT 34.8 04/01/2017 1003   PLT 45 (L) 04/30/2017 0353   PLT 263 04/01/2017 1003   MCV 84.0 04/30/2017 0353   MCV 84.7 04/01/2017 1003   MCH 27.7 04/30/2017 0353   MCHC 32.9 04/30/2017 0353   RDW 15.8 (H) 04/30/2017 0353   RDW 15.7 (H) 04/01/2017 1003   LYMPHSABS 0.9 04/30/2017 0353   LYMPHSABS 1.3 04/01/2017 1003   MONOABS 1.3 (H) 04/30/2017 0353   MONOABS 0.5 04/01/2017 1003   EOSABS 0.1 04/30/2017 0353   EOSABS 0.1 04/01/2017 1003   BASOSABS 0.0 04/30/2017 0353   BASOSABS 0.0 04/01/2017 1003    Hgb A1C Lab Results  Component Value Date   HGBA1C 5.5 03/05/2017              Assessment & Plan:   Low Back Pain:  No need for additional xrays CMET today (history of elevated liver enzymes) If liver enzymes normal, consider scheduled Tylenol Continue Tramadol for now RX for Hospital Bed provided today  Medication Review:  Advised them to stop Fosamax (never started), Zantac, Ditropan, Benicar HCT, Pravastatin.  Return precautions discussed [name], NP

## 2017-06-16 DIAGNOSIS — Z6841 Body Mass Index (BMI) 40.0 and over, adult: Secondary | ICD-10-CM | POA: Diagnosis not present

## 2017-06-16 DIAGNOSIS — E119 Type 2 diabetes mellitus without complications: Secondary | ICD-10-CM | POA: Diagnosis not present

## 2017-06-16 DIAGNOSIS — N390 Urinary tract infection, site not specified: Secondary | ICD-10-CM | POA: Diagnosis not present

## 2017-06-16 DIAGNOSIS — I10 Essential (primary) hypertension: Secondary | ICD-10-CM | POA: Diagnosis not present

## 2017-06-16 DIAGNOSIS — J4530 Mild persistent asthma, uncomplicated: Secondary | ICD-10-CM | POA: Diagnosis not present

## 2017-06-16 DIAGNOSIS — G4733 Obstructive sleep apnea (adult) (pediatric): Secondary | ICD-10-CM | POA: Diagnosis not present

## 2017-06-16 DIAGNOSIS — G9341 Metabolic encephalopathy: Secondary | ICD-10-CM | POA: Diagnosis not present

## 2017-06-16 DIAGNOSIS — D696 Thrombocytopenia, unspecified: Secondary | ICD-10-CM | POA: Diagnosis not present

## 2017-06-18 ENCOUNTER — Telehealth: Payer: Self-pay

## 2017-06-18 DIAGNOSIS — G9341 Metabolic encephalopathy: Secondary | ICD-10-CM | POA: Diagnosis not present

## 2017-06-18 DIAGNOSIS — G4733 Obstructive sleep apnea (adult) (pediatric): Secondary | ICD-10-CM | POA: Diagnosis not present

## 2017-06-18 DIAGNOSIS — E119 Type 2 diabetes mellitus without complications: Secondary | ICD-10-CM | POA: Diagnosis not present

## 2017-06-18 DIAGNOSIS — N390 Urinary tract infection, site not specified: Secondary | ICD-10-CM | POA: Diagnosis not present

## 2017-06-18 DIAGNOSIS — Z6841 Body Mass Index (BMI) 40.0 and over, adult: Secondary | ICD-10-CM | POA: Diagnosis not present

## 2017-06-18 DIAGNOSIS — I10 Essential (primary) hypertension: Secondary | ICD-10-CM | POA: Diagnosis not present

## 2017-06-18 DIAGNOSIS — D696 Thrombocytopenia, unspecified: Secondary | ICD-10-CM | POA: Diagnosis not present

## 2017-06-18 DIAGNOSIS — J4530 Mild persistent asthma, uncomplicated: Secondary | ICD-10-CM | POA: Diagnosis not present

## 2017-06-18 NOTE — Telephone Encounter (Signed)
[facility] for verbal orders for home health speech

## 2017-06-18 NOTE — Telephone Encounter (Signed)
Amy speech therapist with [facility] left v/m requesting cb with verbal orders for speech therapy HH for cognition 2 x a week for next week then recertify [facility] on 62/37/62.

## 2017-06-19 NOTE — Telephone Encounter (Signed)
Amy was notified okay for pt to continue with home health speech therapy.

## 2017-06-21 ENCOUNTER — Telehealth: Payer: Self-pay | Admitting: Hematology

## 2017-06-21 NOTE — Telephone Encounter (Signed)
Lvm for pt's sister Dahlia Byes at [phone] at pt's husbands request. Advised appt chgd from 7/11 due to md on call to 8/1 2 3pm. Gave pt's husband the same info regarding appt chg.

## 2017-06-22 ENCOUNTER — Encounter: Payer: Self-pay | Admitting: Neurology

## 2017-06-22 ENCOUNTER — Telehealth: Payer: Self-pay | Admitting: Internal Medicine

## 2017-06-22 ENCOUNTER — Ambulatory Visit (INDEPENDENT_AMBULATORY_CARE_PROVIDER_SITE_OTHER): Payer: Medicare PPO | Admitting: Neurology

## 2017-06-22 DIAGNOSIS — R413 Other amnesia: Secondary | ICD-10-CM | POA: Diagnosis not present

## 2017-06-22 DIAGNOSIS — R4701 Aphasia: Secondary | ICD-10-CM | POA: Diagnosis not present

## 2017-06-22 HISTORY — DX: Other amnesia: R41.3

## 2017-06-22 MED ORDER — TRAMADOL HCL 50 MG PO TABS: 50.0000 mg | ORAL_TABLET | Freq: Three times a day (TID) | ORAL | 0 refills | Status: DC | PRN

## 2017-06-22 MED ORDER — ALPRAZOLAM 0.5 MG PO TABS: ORAL_TABLET | ORAL | 0 refills | Status: DC

## 2017-06-22 NOTE — Progress Notes (Signed)
Reason for visit: Memory disturbance  Referring physician: Dr. [name] is a 76 y.o. female  History of present illness:  Tiffany Arnold is a 76 year old right-handed white female with a history of a mild memory disturbance dating back about 2 years. The patient was noted to have mild word finding problems, but otherwise she was relatively high level functioning. She has been treated for stage II breast cancer, she just finished up with radiation therapy in February 2018. 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 Around 04/27/2017, the patient was noted to have significant problems with confusion, she became aphasic with speech, and she went to the emergency room for a stroke evaluation. MRI of the brain was done and showed a moderate level small vessel disease, but no acute changes were seen. The patient was noted to have a urinary tract infection, she was felt to be septic with Escherichia coli, and her liver enzymes had significantly elevated with this. Following the hospitalization, the patient has had improvement in the liver enzymes, but her problems with aphasia and word finding have worsened slightly. The patient has had events of altered mental status, one event occurred in late June 2018 and was associated with hypoglycemia with a blood sugar of 22 according to the family. The patient has had alteration in medications, she has come off of some of her blood pressure medications and cholesterol medications and medications for blood sugar. She was taken off of oxybutynin for her bladder. The patient has had a lot of anxiety issues, and panic attacks. She is fighting a lot with her husband, her speech remains very poor. The patient herself reports no headaches or numbness or weakness of the extremities. She has gait instability, she has had prior bilateral total knee replacements, she uses a walker for ambulation. Given the change in  memory and the presence of aphasia, she is sent to this office for an evaluation.  Past Medical History:  Diagnosis Date  . Allergy   . Arthritis   . Asthma   . Cancer ([place]) 07/29/2016   right breast  . Cataract of both eyes   . Chicken pox   . Colon polyps   . Dog bite of index finger 07/23/2016   Left index finger  . GERD (gastroesophageal reflux disease)   . History of radiation therapy 12/24/16- 02/03/17   Right Breast 50.4 Gy in 28 fractions, Right Axilla 45 Gy in 25 fractions.  . Hyperlipidemia   . Hypertension   . IBS (irritable bowel syndrome)   . Phlebitis   . Pneumonia    hx of several yrs ago  . Shortness of breath dyspnea    with exertion only  . Sleep apnea    wears CPAP machine nightly    Past Surgical History:  Procedure Laterality Date  . BACK SURGERY     lower  . BREAST CYST EXCISION Right 2001   negative  . BREAST LUMPECTOMY WITH NEEDLE LOCALIZATION AND AXILLARY LYMPH NODE DISSECTION Right 07/29/2016   Procedure: RIGHT BREAST LUMPECTOMY WITH DOUBLE NEEDLE LOCALIZATION AND COMPLETE RIGHT AXILLARY LYMPH NODE DISSECTION;  Surgeon: Fanny Skates, MD;  Location: [facility];  Service: General;  Laterality: Right;  . BREAST SURGERY Left 2000   Biopsy  . BUNIONECTOMY Bilateral 1998   great toe fusion on right foot  . COLONOSCOPY W/ POLYPECTOMY    . HERNIA REPAIR  7782   Umbillical  .  NASAL SINUS SURGERY  2015  . PORTACATH PLACEMENT N/A 09/05/2016   Procedure: INSERTION PORT-A-CATH;  Surgeon: Fanny Skates, MD;  Location: [facility];  Service: General;  Laterality: N/A;  . REPLACEMENT TOTAL KNEE Bilateral 2007  . TOTAL SHOULDER REPLACEMENT Left 2007    Family History  Problem Relation Age of Onset  . Arthritis Mother   . Stroke Mother   . Hypertension Mother   . Cancer Father        Prostate  . Stroke Father   . Hypertension Father   . Hypertension Maternal Grandmother   . Rheum arthritis Maternal Grandfather   . Stroke Maternal Grandfather   . Hypertension  Maternal Grandfather   . Cancer Paternal Grandmother        Colon  . Hypertension Paternal Grandmother   . Hypertension Paternal Grandfather   . Breast cancer Neg Hx     Social history:  reports that she quit smoking about 26 years ago. She has a 50.00 pack-year smoking history. She has never used smokeless tobacco. She reports that she drinks alcohol. She reports that she does not use drugs.  Medications:  Prior to Admission medications   Medication Sig Start Date End Date Taking? Authorizing Provider  albuterol-ipratropium (COMBIVENT) 18-103 MCG/ACT inhaler Inhale 2 puffs into the lungs every 4 (four) hours as needed for wheezing or shortness of breath.    Yes [provider]  ALPRAZolam Duanne Moron) 0.25 MG tablet TAKE ONE TABLET BY MOUTH TWICE DAILY AS NEEDED 11/26/16  Yes Baity, Coralie Keens, NP  Calcium Carbonate-Vitamin D (CALCIUM-VITAMIN D) 500-200 MG-UNIT tablet Take 1 tablet by mouth daily.   Yes [provider]  cetirizine (ZYRTEC) 10 MG tablet Take 10 mg by mouth daily.   Yes [provider]  Cholecalciferol (VITAMIN D3) 1000 units CAPS Take by mouth 2 (two) times daily.   Yes [provider]  diphenoxylate-atropine (LOMOTIL) 2.5-0.025 MG tablet Take 2 tablets by mouth 4 (four) times daily as needed for diarrhea or loose stools. 11/25/16  Yes Susanne Borders, NP  EPINEPHrine 0.3 mg/0.3 mL IJ SOAJ injection Inject 0.3 mg into the muscle once as needed (ALLERGIC REACTION).    Yes [provider]  glucosamine-chondroitin 500-400 MG tablet Take 1 tablet by mouth 2 (two) times daily.   Yes [provider]  letrozole (FEMARA) 2.5 MG tablet Take 1 tablet (2.5 mg total) by mouth daily. 04/01/17  Yes Truitt Merle, MD  naproxen sodium (ANAPROX) 220 MG tablet Take 220 mg by mouth 2 (two) times daily with a meal.   Yes [provider]  sertraline (ZOLOFT) 25 MG tablet TAKE ONE (1) TABLET EACH DAY 06/04/17  Yes Baity, Coralie Keens, NP  traMADol  (ULTRAM) 50 MG tablet Take 1 tablet (50 mg total) by mouth every 8 (eight) hours as needed. 05/19/17  Yes Baity, Coralie Keens, NP  vitamin B-12 (CYANOCOBALAMIN) 1000 MCG tablet Take 1,000 mcg by mouth daily.   Yes [provider]      Allergies  Allergen Reactions  . Other Swelling and Shortness Of Breath    Cats, dogs, mold Induced asthma Cats, dogs, mold    ROS:  Out of a complete 14 system review of symptoms, the patient complains only of the following symptoms, and all other reviewed systems are negative.  Weight loss Moles Snoring, sleep apnea on CPAP Incontinence of the bladder Easy bruising Feeling hot Joint pain, joint swelling, aching muscles Allergies Memory loss, confusion Depression, anxiety, decreased energy, disinterest in  activities Restless legs  Blood pressure (!) 162/68, pulse 72, height 5' (1.524 m), weight 177 lb 8 oz (80.5 kg).  Physical Exam  General: The patient is alert and cooperative at the time of the examination.  Eyes: Pupils are equal, round, and reactive to light. Discs are flat bilaterally.  Neck: The neck is supple, no carotid bruits are noted.  Respiratory: The respiratory examination is clear.  Cardiovascular: The cardiovascular examination reveals a regular rate and rhythm, no obvious murmurs or rubs are noted.  Skin: Extremities are without significant edema.  Neurologic Exam  Mental status: The patient is alert and oriented x 1 at the time of the examination (not oriented to place or date). The Mini-Mental Status Examination done today shows a total score of 12/30..  Cranial nerves: Facial symmetry is present. There is good sensation of the face to pinprick and soft touch bilaterally. The strength of the facial muscles and the muscles to head turning and shoulder shrug are normal bilaterally. Speech is well enunciated, significant aphasia is noted. The patient has rambling speech that is nonsensical. Extraocular movements are  full. Visual fields are full. The tongue is midline, and the patient has symmetric elevation of the soft palate. No obvious hearing deficits are noted.  Motor: The motor testing reveals 5 over 5 strength of all 4 extremities. Good symmetric motor tone is noted throughout.  Sensory: Sensory testing is intact to pinprick, soft touch, vibration sensation on all 4 extremities. Position sensation is decreased on all 4 extremities. No evidence of extinction is noted.  Coordination: Cerebellar testing reveals good finger-nose-finger and heel-to-shin bilaterally.  Gait and station: Gait is wide-based, unsteady. Tandem gait was not attempted. The patient usually uses a walker for ambulation. Romberg is negative. No drift is seen.  Reflexes: Deep tendon reflexes are symmetric, but are decreased bilaterally. Toes are downgoing bilaterally.   MRI brain 04/27/17:  IMPRESSION: Chronic microvascular ischemic change.  No acute infarct.  Negative for mass or metastatic disease on unenhanced imaging.  * MRI scan images were reviewed online. I agree with the written report.    Assessment/Plan:  1. Memory disturbance, aphasia  2. History of breast cancer  The patient has had a significant alteration in her mental status since an episode of sepsis that occurred in early May 2018. The family believes that her verbal abilities have declined since then, she has had at least one documented episode of hypoglycemia. Recurring episodes of hypoglycemia may result in permanent brain injury, the patient did have a prior MRI of the brain that was done without gadolinium enhancement, this will be repeated with and without enhancement. If the study is unremarkable, the patient will be set up for lumbar puncture to fully exclude the possibility of carcinomatous meningitis. The patient will follow-up in 3 months.  Jill Alexanders MD 06/22/2017 11:42 AM  [facility] [address] [place], [place] [address]  Phone [phone] Fax [phone]

## 2017-06-22 NOTE — Telephone Encounter (Signed)
Baity pt, out of the office... Last filled 05/19/17... Please advise

## 2017-06-22 NOTE — Telephone Encounter (Signed)
Pt needs refill of Tramadol. Placed RX request in RX tower.

## 2017-06-22 NOTE — Patient Instructions (Signed)
   We will check MRI of the brain, consider a spinal tap.

## 2017-06-22 NOTE — Telephone Encounter (Signed)
Please call in.  Thanks.   

## 2017-06-23 ENCOUNTER — Telehealth: Payer: Self-pay | Admitting: *Deleted

## 2017-06-23 ENCOUNTER — Other Ambulatory Visit: Payer: Self-pay | Admitting: *Deleted

## 2017-06-23 DIAGNOSIS — J4530 Mild persistent asthma, uncomplicated: Secondary | ICD-10-CM | POA: Diagnosis not present

## 2017-06-23 DIAGNOSIS — N390 Urinary tract infection, site not specified: Secondary | ICD-10-CM | POA: Diagnosis not present

## 2017-06-23 DIAGNOSIS — Z6841 Body Mass Index (BMI) 40.0 and over, adult: Secondary | ICD-10-CM | POA: Diagnosis not present

## 2017-06-23 DIAGNOSIS — E119 Type 2 diabetes mellitus without complications: Secondary | ICD-10-CM | POA: Diagnosis not present

## 2017-06-23 DIAGNOSIS — G4733 Obstructive sleep apnea (adult) (pediatric): Secondary | ICD-10-CM | POA: Diagnosis not present

## 2017-06-23 DIAGNOSIS — C50311 Malignant neoplasm of lower-inner quadrant of right female breast: Secondary | ICD-10-CM

## 2017-06-23 DIAGNOSIS — D696 Thrombocytopenia, unspecified: Secondary | ICD-10-CM | POA: Diagnosis not present

## 2017-06-23 DIAGNOSIS — I10 Essential (primary) hypertension: Secondary | ICD-10-CM | POA: Diagnosis not present

## 2017-06-23 DIAGNOSIS — G9341 Metabolic encephalopathy: Secondary | ICD-10-CM | POA: Diagnosis not present

## 2017-06-23 NOTE — Telephone Encounter (Signed)
Fine with me

## 2017-06-23 NOTE — Telephone Encounter (Signed)
Medication phoned to pharmacy.  

## 2017-06-23 NOTE — Telephone Encounter (Signed)
Patient's sister would like to have patient transferred to Dr. Caryl Bis, Patient is closer to this office. Please advise. Sister stated that she has medical POA.  Contact [name] [phone]

## 2017-06-23 NOTE — Telephone Encounter (Signed)
This is fine with me   

## 2017-06-25 ENCOUNTER — Telehealth: Payer: Self-pay | Admitting: *Deleted

## 2017-06-25 DIAGNOSIS — N390 Urinary tract infection, site not specified: Secondary | ICD-10-CM | POA: Diagnosis not present

## 2017-06-25 DIAGNOSIS — Z6841 Body Mass Index (BMI) 40.0 and over, adult: Secondary | ICD-10-CM | POA: Diagnosis not present

## 2017-06-25 DIAGNOSIS — E119 Type 2 diabetes mellitus without complications: Secondary | ICD-10-CM | POA: Diagnosis not present

## 2017-06-25 DIAGNOSIS — J4530 Mild persistent asthma, uncomplicated: Secondary | ICD-10-CM | POA: Diagnosis not present

## 2017-06-25 DIAGNOSIS — G4733 Obstructive sleep apnea (adult) (pediatric): Secondary | ICD-10-CM | POA: Diagnosis not present

## 2017-06-25 DIAGNOSIS — I10 Essential (primary) hypertension: Secondary | ICD-10-CM | POA: Diagnosis not present

## 2017-06-25 DIAGNOSIS — D696 Thrombocytopenia, unspecified: Secondary | ICD-10-CM | POA: Diagnosis not present

## 2017-06-25 DIAGNOSIS — G9341 Metabolic encephalopathy: Secondary | ICD-10-CM | POA: Diagnosis not present

## 2017-06-25 NOTE — Telephone Encounter (Signed)
[name] (patient's sister and on [place]) left a message requesting a new script be sent to the pharmacy for Alprazolam 0.5 mg. [name] stated that the script has been written for as needed and wants it changed to two times a day a needed. [name] stated that patient is needing the medication more often because of her stress and anxiety and would like this changed as soon as possible. Last refill 06/22/17  #3. Alprazolam 0.25 mg last refill 12//6/17 #60.

## 2017-06-29 NOTE — Telephone Encounter (Signed)
I will not be changing any medications at this time as pt is pending transfer to Dr. Caryl Bis.

## 2017-06-30 ENCOUNTER — Telehealth: Payer: Self-pay

## 2017-06-30 DIAGNOSIS — G9341 Metabolic encephalopathy: Secondary | ICD-10-CM | POA: Diagnosis not present

## 2017-06-30 DIAGNOSIS — Z6841 Body Mass Index (BMI) 40.0 and over, adult: Secondary | ICD-10-CM | POA: Diagnosis not present

## 2017-06-30 DIAGNOSIS — K589 Irritable bowel syndrome without diarrhea: Secondary | ICD-10-CM | POA: Diagnosis not present

## 2017-06-30 DIAGNOSIS — E119 Type 2 diabetes mellitus without complications: Secondary | ICD-10-CM | POA: Diagnosis not present

## 2017-06-30 DIAGNOSIS — D696 Thrombocytopenia, unspecified: Secondary | ICD-10-CM | POA: Diagnosis not present

## 2017-06-30 DIAGNOSIS — J4530 Mild persistent asthma, uncomplicated: Secondary | ICD-10-CM | POA: Diagnosis not present

## 2017-06-30 DIAGNOSIS — G4733 Obstructive sleep apnea (adult) (pediatric): Secondary | ICD-10-CM | POA: Diagnosis not present

## 2017-06-30 DIAGNOSIS — I10 Essential (primary) hypertension: Secondary | ICD-10-CM | POA: Diagnosis not present

## 2017-06-30 NOTE — Telephone Encounter (Signed)
Amy HH speech therapist with [facility] left v/m to let R Baity NP know that Amy is putting pt on hold for [facility] speech therapy; pt had significant decline over weekend; pts sister it trying to get previously ordered MRI scheduled and trying to get pt in a memory unit; Amy wants to put [facility] on hold until can figure out what is going on with pt cognitively and medically.Amy request cb if this is OK with Avie Echevaria NP.

## 2017-06-30 NOTE — Telephone Encounter (Signed)
Verbal ok given to Amy. °

## 2017-06-30 NOTE — Telephone Encounter (Signed)
Ok with me 

## 2017-07-01 ENCOUNTER — Ambulatory Visit: Payer: Medicare PPO | Admitting: Hematology

## 2017-07-01 ENCOUNTER — Other Ambulatory Visit: Payer: Medicare PPO

## 2017-07-03 DIAGNOSIS — R269 Unspecified abnormalities of gait and mobility: Secondary | ICD-10-CM | POA: Diagnosis not present

## 2017-07-06 ENCOUNTER — Inpatient Hospital Stay: Payer: Medicare Other

## 2017-07-06 ENCOUNTER — Emergency Department: Payer: Medicare Other

## 2017-07-06 ENCOUNTER — Inpatient Hospital Stay
Admission: EM | Admit: 2017-07-06 | Discharge: 2017-07-09 | DRG: 071 | Disposition: A | Discharge status: Skilled Nursing Facility | Payer: Medicare Other | Attending: Internal Medicine | Admitting: Internal Medicine

## 2017-07-06 ENCOUNTER — Telehealth: Payer: Self-pay | Admitting: Internal Medicine

## 2017-07-06 DIAGNOSIS — Z66 Do not resuscitate: Secondary | ICD-10-CM | POA: Diagnosis not present

## 2017-07-06 DIAGNOSIS — Z96612 Presence of left artificial shoulder joint: Secondary | ICD-10-CM | POA: Diagnosis not present

## 2017-07-06 DIAGNOSIS — Z853 Personal history of malignant neoplasm of breast: Secondary | ICD-10-CM

## 2017-07-06 DIAGNOSIS — Z9109 Other allergy status, other than to drugs and biological substances: Secondary | ICD-10-CM

## 2017-07-06 DIAGNOSIS — G9341 Metabolic encephalopathy: Secondary | ICD-10-CM | POA: Diagnosis not present

## 2017-07-06 DIAGNOSIS — G4733 Obstructive sleep apnea (adult) (pediatric): Secondary | ICD-10-CM | POA: Diagnosis not present

## 2017-07-06 DIAGNOSIS — I10 Essential (primary) hypertension: Secondary | ICD-10-CM | POA: Diagnosis present

## 2017-07-06 DIAGNOSIS — R410 Disorientation, unspecified: Secondary | ICD-10-CM

## 2017-07-06 DIAGNOSIS — N390 Urinary tract infection, site not specified: Secondary | ICD-10-CM | POA: Diagnosis not present

## 2017-07-06 DIAGNOSIS — Z87891 Personal history of nicotine dependence: Secondary | ICD-10-CM | POA: Diagnosis not present

## 2017-07-06 DIAGNOSIS — Z79899 Other long term (current) drug therapy: Secondary | ICD-10-CM

## 2017-07-06 DIAGNOSIS — K219 Gastro-esophageal reflux disease without esophagitis: Secondary | ICD-10-CM | POA: Diagnosis not present

## 2017-07-06 DIAGNOSIS — Z96653 Presence of artificial knee joint, bilateral: Secondary | ICD-10-CM | POA: Diagnosis not present

## 2017-07-06 DIAGNOSIS — R4182 Altered mental status, unspecified: Secondary | ICD-10-CM

## 2017-07-06 DIAGNOSIS — K589 Irritable bowel syndrome without diarrhea: Secondary | ICD-10-CM | POA: Diagnosis not present

## 2017-07-06 DIAGNOSIS — B9620 Unspecified Escherichia coli [E. coli] as the cause of diseases classified elsewhere: Secondary | ICD-10-CM | POA: Diagnosis not present

## 2017-07-06 DIAGNOSIS — G9340 Encephalopathy, unspecified: Secondary | ICD-10-CM

## 2017-07-06 DIAGNOSIS — Z923 Personal history of irradiation: Secondary | ICD-10-CM | POA: Diagnosis not present

## 2017-07-06 DIAGNOSIS — F05 Delirium due to known physiological condition: Secondary | ICD-10-CM | POA: Diagnosis not present

## 2017-07-06 DIAGNOSIS — L8990 Pressure ulcer of unspecified site, unspecified stage: Secondary | ICD-10-CM | POA: Insufficient documentation

## 2017-07-06 DIAGNOSIS — E785 Hyperlipidemia, unspecified: Secondary | ICD-10-CM | POA: Diagnosis not present

## 2017-07-06 LAB — URINALYSIS, COMPLETE (UACMP) WITH MICROSCOPIC
BILIRUBIN URINE: NEGATIVE
Glucose, UA: NEGATIVE mg/dL
KETONES UR: NEGATIVE mg/dL
NITRITE: POSITIVE — AB
PROTEIN: 30 mg/dL — AB
Specific Gravity, Urine: 1.02 (ref 1.005–1.030)
pH: 5 (ref 5.0–8.0)

## 2017-07-06 LAB — COMPREHENSIVE METABOLIC PANEL
ALK PHOS: 83 U/L (ref 38–126)
ALT: 12 U/L — AB (ref 14–54)
AST: 27 U/L (ref 15–41)
Albumin: 2.9 g/dL — ABNORMAL LOW (ref 3.5–5.0)
Anion gap: 10 (ref 5–15)
BUN: 25 mg/dL — AB (ref 6–20)
CALCIUM: 10.4 mg/dL — AB (ref 8.9–10.3)
CO2: 28 mmol/L (ref 22–32)
CREATININE: 0.65 mg/dL (ref 0.44–1.00)
Chloride: 100 mmol/L — ABNORMAL LOW (ref 101–111)
GFR calc non Af Amer: >60 mL/min (ref >60)
Glucose, Bld: 141 mg/dL — ABNORMAL HIGH (ref 65–99)
Potassium: 4.5 mmol/L (ref 3.5–5.1)
SODIUM: 138 mmol/L (ref 135–145)
Total Bilirubin: 1 mg/dL (ref 0.3–1.2)
Total Protein: 7.4 g/dL (ref 6.5–8.1)

## 2017-07-06 LAB — CBC
HCT: 30.2 % — ABNORMAL LOW (ref 35.0–47.0)
Hemoglobin: 9.6 g/dL — ABNORMAL LOW (ref 12.0–16.0)
MCH: 24.9 pg — ABNORMAL LOW (ref 26.0–34.0)
MCHC: 31.8 g/dL — ABNORMAL LOW (ref 32.0–36.0)
MCV: 78.1 fL — AB (ref 80.0–100.0)
Platelets: 410 10*3/uL (ref 150–440)
RBC: 3.86 MIL/uL (ref 3.80–5.20)
RDW: 17.5 % — ABNORMAL HIGH (ref 11.5–14.5)
WBC: 10.8 10*3/uL (ref 3.6–11.0)

## 2017-07-06 LAB — LACTIC ACID, PLASMA: Lactic Acid, Venous: 1.6 mmol/L (ref 0.5–1.9)

## 2017-07-06 MED ORDER — VITAMIN B-12 1000 MCG PO TABS
1000.0000 ug | ORAL_TABLET | Freq: Every day | ORAL | Status: DC
  Administered 2017-07-07 – 2017-07-09 (×3): 1000 ug via ORAL
  Filled 2017-07-06 (×3): qty 1

## 2017-07-06 MED ORDER — GLUCOSAMINE-CHONDROITIN 500-400 MG PO TABS: 1.0000 | ORAL_TABLET | Freq: Two times a day (BID) | ORAL | Status: DC

## 2017-07-06 MED ORDER — IPRATROPIUM-ALBUTEROL 18-103 MCG/ACT IN AERO: 2.0000 | INHALATION_SPRAY | RESPIRATORY_TRACT | Status: DC | PRN

## 2017-07-06 MED ORDER — CALCIUM-VITAMIN D 500-200 MG-UNIT PO TABS
1.0000 | ORAL_TABLET | Freq: Every day | ORAL | Status: DC
  Administered 2017-07-07 – 2017-07-09 (×3): 1 via ORAL
  Filled 2017-07-06 (×3): qty 1

## 2017-07-06 MED ORDER — DEXTROSE 5 % IV SOLN
1.0000 g | Freq: Once | INTRAVENOUS | Status: AC
  Administered 2017-07-06: 1 g via INTRAVENOUS
  Filled 2017-07-06: qty 10

## 2017-07-06 MED ORDER — SERTRALINE HCL 25 MG PO TABS
25.0000 mg | ORAL_TABLET | Freq: Every day | ORAL | Status: DC
Start: 2017-07-07 — End: 2017-07-09
  Administered 2017-07-07 – 2017-07-09 (×3): 25 mg via ORAL
  Filled 2017-07-06 (×3): qty 1

## 2017-07-06 MED ORDER — ACETAMINOPHEN 650 MG RE SUPP: 650.0000 mg | Freq: Four times a day (QID) | RECTAL | Status: DC | PRN

## 2017-07-06 MED ORDER — HEPARIN SODIUM (PORCINE) 5000 UNIT/ML IJ SOLN
5000.0000 [IU] | Freq: Three times a day (TID) | INTRAMUSCULAR | Status: DC
  Administered 2017-07-06 – 2017-07-08 (×5): 5000 [IU] via SUBCUTANEOUS
  Filled 2017-07-06 (×5): qty 1

## 2017-07-06 MED ORDER — ALPRAZOLAM 0.5 MG PO TABS
0.2500 mg | ORAL_TABLET | Freq: Two times a day (BID) | ORAL | Status: DC | PRN
  Administered 2017-07-09: 12:00:00 0.25 mg via ORAL
  Filled 2017-07-06: qty 1

## 2017-07-06 MED ORDER — SODIUM CHLORIDE 0.9% FLUSH: 3.0000 mL | INTRAVENOUS | Status: DC | PRN

## 2017-07-06 MED ORDER — SODIUM CHLORIDE 0.9 % IV SOLN: 250.0000 mL | INTRAVENOUS | Status: DC | PRN

## 2017-07-06 MED ORDER — GADOBENATE DIMEGLUMINE 529 MG/ML IV SOLN
15.0000 mL | Freq: Once | INTRAVENOUS | Status: AC | PRN
  Administered 2017-07-06: 15 mL via INTRAVENOUS

## 2017-07-06 MED ORDER — CEFTRIAXONE SODIUM 1 G IJ SOLR
1.0000 g | INTRAMUSCULAR | Status: DC
  Administered 2017-07-07: 19:00:00 1 g via INTRAVENOUS
  Filled 2017-07-06 (×2): qty 10

## 2017-07-06 MED ORDER — SODIUM CHLORIDE 0.9% FLUSH
3.0000 mL | Freq: Two times a day (BID) | INTRAVENOUS | Status: DC
  Administered 2017-07-06 – 2017-07-09 (×6): 3 mL via INTRAVENOUS

## 2017-07-06 MED ORDER — IPRATROPIUM-ALBUTEROL 0.5-2.5 (3) MG/3ML IN SOLN: 3.0000 mL | RESPIRATORY_TRACT | Status: DC | PRN

## 2017-07-06 MED ORDER — LETROZOLE 2.5 MG PO TABS
2.5000 mg | ORAL_TABLET | Freq: Every day | ORAL | Status: DC
Start: 2017-07-07 — End: 2017-07-09
  Administered 2017-07-07 – 2017-07-09 (×3): 2.5 mg via ORAL
  Filled 2017-07-06 (×3): qty 1

## 2017-07-06 MED ORDER — ACETAMINOPHEN 325 MG PO TABS: 650.0000 mg | ORAL_TABLET | Freq: Four times a day (QID) | ORAL | Status: DC | PRN

## 2017-07-06 MED ORDER — DIPHENOXYLATE-ATROPINE 2.5-0.025 MG PO TABS: 2.0000 | ORAL_TABLET | Freq: Four times a day (QID) | ORAL | Status: DC | PRN

## 2017-07-06 MED ORDER — ALPRAZOLAM 0.5 MG PO TABS
0.2500 mg | ORAL_TABLET | Freq: Once | ORAL | Status: AC
  Administered 2017-07-06: 23:00:00 0.25 mg via ORAL
  Filled 2017-07-06 (×2): qty 1

## 2017-07-06 MED ORDER — LORATADINE 10 MG PO TABS
10.0000 mg | ORAL_TABLET | Freq: Every day | ORAL | Status: DC
  Administered 2017-07-07 – 2017-07-09 (×3): 10 mg via ORAL
  Filled 2017-07-06 (×3): qty 1

## 2017-07-06 NOTE — H&P (Signed)
Tiffany Arnold is an 76 y.o. female.   Chief Complaint: Altered mental status HPI: This is a 76 year old female who comes in with altered mental status. Her sister has healthcare power of attorney is with her. She said today she became more confused than usual and she had foul-smelling urine. She had been in the hospital a couple months ago at [facility] for urinary tract infection at that point. The sister says that before that she started having decline in her mental status and then more rapid decline since then has never been back to her baseline. She is seen in outpatient neurologist and they had planned to get an MRI with contrast to rule out metastatic from breast cancer.  Past Medical History:  Diagnosis Date  . Allergy   . Aphasia 06/22/2017  . Arthritis   . Asthma   . Cancer ([place]) 07/29/2016   right breast  . Cataract of both eyes   . Chicken pox   . Colon polyps   . Dog bite of index finger 07/23/2016   Left index finger  . GERD (gastroesophageal reflux disease)   . History of radiation therapy 12/24/16- 02/03/17   Right Breast 50.4 Gy in 28 fractions, Right Axilla 45 Gy in 25 fractions.  . Hyperlipidemia   . Hypertension   . IBS (irritable bowel syndrome)   . Memory disorder 06/22/2017  . Phlebitis   . Pneumonia    hx of several yrs ago  . Shortness of breath dyspnea    with exertion only  . Sleep apnea    wears CPAP machine nightly    Past Surgical History:  Procedure Laterality Date  . BACK SURGERY     lower  . BREAST CYST EXCISION Right 2001   negative  . BREAST LUMPECTOMY WITH NEEDLE LOCALIZATION AND AXILLARY LYMPH NODE DISSECTION Right 07/29/2016   Procedure: RIGHT BREAST LUMPECTOMY WITH DOUBLE NEEDLE LOCALIZATION AND COMPLETE RIGHT AXILLARY LYMPH NODE DISSECTION;  Surgeon: Fanny Skates, MD;  Location: [facility];  Service: General;  Laterality: Right;  . BREAST SURGERY Left 2000   Biopsy  . BUNIONECTOMY Bilateral 1998   great toe fusion on right foot  .  COLONOSCOPY W/ POLYPECTOMY    . HERNIA REPAIR  5102   [place] SINUS SURGERY  2015  . PORTACATH PLACEMENT N/A 09/05/2016   Procedure: INSERTION PORT-A-CATH;  Surgeon: Fanny Skates, MD;  [facility];  Service: General;  Laterality: N/A;  . REPLACEMENT TOTAL KNEE Bilateral 2007  . TOTAL SHOULDER REPLACEMENT Left 2007    Family History  Problem Relation Age of Onset  . Arthritis Mother   . Stroke Mother   . Hypertension Mother   . Cancer Father        Prostate  . Stroke Father   . Hypertension Father   . Hypertension Maternal Grandmother   . Rheum arthritis Maternal Grandfather   . Stroke Maternal Grandfather   . Hypertension Maternal Grandfather   . Cancer Paternal Grandmother        Colon  . Hypertension Paternal Grandmother   . Hypertension Paternal Grandfather   . Breast cancer Neg Hx    Social History:  reports that she quit smoking about 26 years ago. She has a 50.00 pack-year smoking history. She has never used smokeless tobacco. She reports that she drinks alcohol. She reports that she does not use drugs.  Allergies:  Allergies  Allergen Reactions  . Other Swelling and Shortness Of Breath    Cats,  dogs, mold Induced asthma Cats, dogs, mold     (Not in a hospital admission)  Results for orders placed or performed during the hospital encounter of 07/06/17 (from the past 48 hour(s))  Comprehensive metabolic panel     Status: Abnormal   Collection Time: 07/06/17  1:18 PM  Result Value Ref Range   Sodium 138 135 - 145 mmol/L   Potassium 4.5 3.5 - 5.1 mmol/L    Comment: HEMOLYSIS AT THIS LEVEL MAY AFFECT RESULT   Chloride 100 (L) 101 - 111 mmol/L   CO2 28 22 - 32 mmol/L   Glucose, Bld 141 (H) 65 - 99 mg/dL   BUN 25 (H) 6 - 20 mg/dL   Creatinine, Ser 0.65 0.44 - 1.00 mg/dL   Calcium 10.4 (H) 8.9 - 10.3 mg/dL   Total Protein 7.4 6.5 - 8.1 g/dL   Albumin 2.9 (L) 3.5 - 5.0 g/dL   AST 27 15 - 41 U/L    Comment: HEMOLYSIS AT THIS LEVEL MAY AFFECT  RESULT   ALT 12 (L) 14 - 54 U/L    Comment: HEMOLYSIS AT THIS LEVEL MAY AFFECT RESULT   Alkaline Phosphatase 83 38 - 126 U/L   Total Bilirubin 1.0 0.3 - 1.2 mg/dL    Comment: HEMOLYSIS AT THIS LEVEL MAY AFFECT RESULT   GFR calc non Af Amer >60 >60 mL/min   GFR calc Af Amer >60 >60 mL/min    Comment: (NOTE) The eGFR has been calculated using the CKD EPI equation. This calculation has not been validated in all clinical situations. eGFR's persistently <60 mL/min signify possible Chronic Kidney Disease.    Anion gap 10 5 - 15  CBC     Status: Abnormal   Collection Time: 07/06/17  1:18 PM  Result Value Ref Range   WBC 10.8 3.6 - 11.0 K/uL   RBC 3.86 3.80 - 5.20 MIL/uL   Hemoglobin 9.6 (L) 12.0 - 16.0 g/dL   HCT 30.2 (L) 35.0 - 47.0 %   MCV 78.1 (L) 80.0 - 100.0 fL   MCH 24.9 (L) 26.0 - 34.0 pg   MCHC 31.8 (L) 32.0 - 36.0 g/dL   RDW 17.5 (H) 11.5 - 14.5 %   Platelets 410 150 - 440 K/uL  Urinalysis, Complete w Microscopic     Status: Abnormal   Collection Time: 07/06/17  1:18 PM  Result Value Ref Range   Color, Urine YELLOW (A) YELLOW   APPearance HAZY (A) CLEAR   Specific Gravity, Urine 1.020 1.005 - 1.030   pH 5.0 5.0 - 8.0   Glucose, UA NEGATIVE NEGATIVE mg/dL   Hgb urine dipstick SMALL (A) NEGATIVE   Bilirubin Urine NEGATIVE NEGATIVE   Ketones, ur NEGATIVE NEGATIVE mg/dL   Protein, ur 30 (A) NEGATIVE mg/dL   Nitrite POSITIVE (A) NEGATIVE   Leukocytes, UA TRACE (A) NEGATIVE   RBC / HPF 0-5 0 - 5 RBC/hpf   WBC, UA 6-30 0 - 5 WBC/hpf   Bacteria, UA FEW (A) NONE SEEN   Squamous Epithelial / LPF 0-5 (A) NONE SEEN   Mucous PRESENT   Lactic acid, plasma     Status: None   Collection Time: 07/06/17  1:18 PM  Result Value Ref Range   Lactic Acid, Venous 1.6 0.5 - 1.9 mmol/L   Ct Head Wo Contrast  Result Date: 07/06/2017 CLINICAL DATA:  Altered mental status. EXAM: CT HEAD WITHOUT CONTRAST TECHNIQUE: Contiguous axial images were obtained from the base of the skull through the  vertex  without intravenous contrast. COMPARISON:  09/14/2012. FINDINGS: Brain: Diffusely enlarged ventricles and subarachnoid spaces. Patchy white matter low density in both cerebral hemispheres. No intracranial hemorrhage, mass lesion or CT evidence of acute infarction. Vascular: No hyperdense vessel or unexpected calcification. Skull: Normal. Negative for fracture or focal lesion. Sinuses/Orbits: Surgical absence of the medial walls of both maxillary sinuses. Unremarkable orbits. Other: None. IMPRESSION: No acute abnormality. Progressive mild diffuse cerebral and cerebellar atrophy and moderate chronic small vessel white matter ischemic changes in both cerebral hemispheres. Electronically Signed   By: Claudie Revering M.D.   On: 07/06/2017 13:54    Review of Systems  Unable to perform ROS: Dementia    Blood pressure (!) 166/72, pulse 78, temperature 97.7 F (36.5 C), temperature source Oral, resp. rate (!) 21, height _0  (1.676 m), weight 79.4 kg (175 lb), SpO2 98 %. Physical Exam  Constitutional: She appears well-developed and well-nourished. No distress.  HENT:  Head: Normocephalic and atraumatic.  Mouth/Throat: Oropharynx is clear and moist. No oropharyngeal exudate.  Eyes: Pupils are equal, round, and reactive to light.  Neck: Neck supple. No JVD present. No tracheal deviation present. No thyromegaly present.  Cardiovascular: Normal rate and regular rhythm.   No murmur heard. Respiratory: Effort normal and breath sounds normal. No respiratory distress. She has no wheezes. She has no rales. She exhibits no tenderness.  GI: Bowel sounds are normal. She exhibits no distension and no mass. There is no tenderness. There is no rebound.  Musculoskeletal: She exhibits no edema.  Neurological: She is alert.  Alert but confused. Not oriented to person or place.  Skin: Skin is warm and dry.     Assessment/Plan 1. metabolic encephalopathy. She has worsening mental status likely due to the  infection. We'll treat the infection. The sister says she hasn't been back to her baseline of 3-4 months ago as been rapidly declining in mental status. We'll go ahead and get MRI of the brain to rule out metastases from breast cancer. 2. Urinary tract infection. We'll go ahead and start IV Rocephin. Get urine cultures. 3. Hypertension. We'll continue current medications 4. Hyperlipidemia. We'll continue current medications  Total time spent 30 minutes Baxter Hire, MD 07/06/2017, 3:48 PM

## 2017-07-06 NOTE — Progress Notes (Signed)
PHARMACIST - PHYSICIAN ORDER COMMUNICATION  CONCERNING: P&T Medication Policy on Herbal Medications  DESCRIPTION:  This patient's order for:  Glucosamine-Chondroitin  has been noted.  This product(s) is classified as an "herbal" or natural product. Due to a lack of definitive safety studies or FDA approval, nonstandard manufacturing practices, plus the potential risk of unknown drug-drug interactions while on inpatient medications, the Pharmacy and Therapeutics Committee does not permit the use of "herbal" or natural products of this type within [facility].   ACTION TAKEN: The pharmacy department is unable to verify this order at this time and your patient has been informed of this safety policy. Please reevaluate patient's clinical condition at discharge and address if the herbal or natural product(s) should be resumed at that time.   

## 2017-07-06 NOTE — ED Provider Notes (Signed)
[facility] Emergency Department Provider Note  Time seen: 2:42 PM  I have reviewed the triage vital signs and the nursing notes.   HISTORY  Chief Complaint Altered Mental Status    HPI Tiffany Arnold is a 76 y.o. female with a past medical history of gastric reflux, arthritis, hypertension, hyperlipidemia, confusion/memory deficits, presents the emergency department for altered mental status. According to family at the beginning of May the patient suffered a urinary tract infection requiring hospital admission, sepsis treated with prolonged antibiotics. They state since that time the patient has had mild continued confusion. They state over the past 2-3 days ago and fusion has worsened significantly, patient is speaking to people who were not in the room/hallucinating. They state last time this was how she acted when she had a urinary tract infection/sepsis. He had the patient is awake alert, she is not oriented however family states that is somewhat baseline for her. She mostly speaks nonsensical, which the family states is a change in her baseline. Family denies any fever. Patient is unable to answer review of systems questions accurately.  Past Medical History:  Diagnosis Date  . Allergy   . Aphasia 06/22/2017  . Arthritis   . Asthma   . Cancer ([place]) 07/29/2016   right breast  . Cataract of both eyes   . Chicken pox   . Colon polyps   . Dog bite of index finger 07/23/2016   Left index finger  . GERD (gastroesophageal reflux disease)   . History of radiation therapy 12/24/16- 02/03/17   Right Breast 50.4 Gy in 28 fractions, Right Axilla 45 Gy in 25 fractions.  . Hyperlipidemia   . Hypertension   . IBS (irritable bowel syndrome)   . Memory disorder 06/22/2017  . Phlebitis   . Pneumonia    hx of several yrs ago  . Shortness of breath dyspnea    with exertion only  . Sleep apnea    wears CPAP machine nightly    Patient Active Problem List   Diagnosis  Date Noted  . Memory disorder 06/22/2017  . Aphasia 06/22/2017  . Osteopenia 04/01/2017  . Port catheter in place 12/25/2016  . IBS (irritable bowel syndrome) 11/25/2016  . Breast cancer of lower-inner quadrant of right female breast ([place]) 06/27/2016  . OSA on CPAP 06/26/2016  . Type 2 diabetes mellitus without complication ([place]) 43/32/9518  . Severe obesity (BMI >= 40) ([place]) 09/04/2014  . Essential hypertension 09/04/2014  . HLD (hyperlipidemia) 09/04/2014  . Gastroesophageal reflux disease without esophagitis 09/04/2014  . Asthma, mild persistent 09/04/2014  . Seasonal allergies 09/04/2014  . Generalized anxiety disorder 09/04/2014    Past Surgical History:  Procedure Laterality Date  . BACK SURGERY     lower  . BREAST CYST EXCISION Right 2001   negative  . BREAST LUMPECTOMY WITH NEEDLE LOCALIZATION AND AXILLARY LYMPH NODE DISSECTION Right 07/29/2016   Procedure: RIGHT BREAST LUMPECTOMY WITH DOUBLE NEEDLE LOCALIZATION AND COMPLETE RIGHT AXILLARY LYMPH NODE DISSECTION;  Surgeon: Fanny Skates, MD;  Location: [facility];  Service: General;  Laterality: Right;  . BREAST SURGERY Left 2000   Biopsy  . BUNIONECTOMY Bilateral 1998   great toe fusion on right foot  . COLONOSCOPY W/ POLYPECTOMY    . HERNIA REPAIR  8416   [place] SINUS SURGERY  2015  . PORTACATH PLACEMENT N/A 09/05/2016   Procedure: INSERTION PORT-A-CATH;  Surgeon: Fanny Skates, MD;  [facility];  Service: General;  Laterality: N/A;  .  REPLACEMENT TOTAL KNEE Bilateral 2007  . TOTAL SHOULDER REPLACEMENT Left 2007    Prior to Admission medications   Medication Sig Start Date End Date Taking? Authorizing Provider  albuterol-ipratropium (COMBIVENT) 18-103 MCG/ACT inhaler Inhale 2 puffs into the lungs every 4 (four) hours as needed for wheezing or shortness of breath.     [provider]  ALPRAZolam Duanne Moron) 0.25 MG tablet TAKE ONE TABLET BY MOUTH TWICE DAILY AS NEEDED 11/26/16   [name], NP   ALPRAZolam Duanne Moron) 0.5 MG tablet Take 2 tablets approximately 45 minutes prior to the MRI study, take a third tablet if needed. 06/22/17   Kathrynn Ducking, MD  Calcium Carbonate-Vitamin D (CALCIUM-VITAMIN D) 500-200 MG-UNIT tablet Take 1 tablet by mouth daily.    [provider]  cetirizine (ZYRTEC) 10 MG tablet Take 10 mg by mouth daily.    [provider]  Cholecalciferol (VITAMIN D3) 1000 units CAPS Take by mouth 2 (two) times daily.    [provider]  diphenoxylate-atropine (LOMOTIL) 2.5-0.025 MG tablet Take 2 tablets by mouth 4 (four) times daily as needed for diarrhea or loose stools. 11/25/16   Susanne Borders, NP  EPINEPHrine 0.3 mg/0.3 mL IJ SOAJ injection Inject 0.3 mg into the muscle once as needed (ALLERGIC REACTION).     [provider]  glucosamine-chondroitin 500-400 MG tablet Take 1 tablet by mouth 2 (two) times daily.    [provider]  letrozole (FEMARA) 2.5 MG tablet Take 1 tablet (2.5 mg total) by mouth daily. 04/01/17   Truitt Merle, MD  naproxen sodium (ANAPROX) 220 MG tablet Take 220 mg by mouth 2 (two) times daily with a meal.    [provider]  sertraline (ZOLOFT) 25 MG tablet TAKE ONE (1) TABLET EACH DAY 06/04/17   [name], NP  traMADol (ULTRAM) 50 MG tablet Take 1 tablet (50 mg total) by mouth every 8 (eight) hours as needed. 06/22/17   Tonia Ghent, MD  vitamin B-12 (CYANOCOBALAMIN) 1000 MCG tablet Take 1,000 mcg by mouth daily.    [provider]    Allergies  Allergen Reactions  . Other Swelling and Shortness Of Breath    Cats, dogs, mold Induced asthma Cats, dogs, mold    Family History  Problem Relation Age of Onset  . Arthritis Mother   . Stroke Mother   . Hypertension Mother   . Cancer Father        Prostate  . Stroke Father   . Hypertension Father   . Hypertension Maternal Grandmother   . Rheum arthritis Maternal Grandfather   . Stroke Maternal Grandfather   . Hypertension  Maternal Grandfather   . Cancer Paternal Grandmother        Colon  . Hypertension Paternal Grandmother   . Hypertension Paternal Grandfather   . Breast cancer Neg Hx     Social History Social History  Substance Use Topics  . Smoking status: Former Smoker    Packs/day: 2.00    Years: 25.00    Quit date: 12/22/1990  . Smokeless tobacco: Never Used     Comment: quit 24 years ago  . Alcohol use Yes     Comment: rare    Review of Systems Unable to obtain an adequate review of systems due to altered mental status and confusion ____________________________________________   PHYSICAL EXAM:  VITAL SIGNS: ED Triage Vitals  Enc Vitals Group     BP 07/06/17 1318 (!) 158/73     Pulse  Rate 07/06/17 1318 80     Resp 07/06/17 1318 17     Temp 07/06/17 1318 97.7 F (36.5 C)     Temp Source 07/06/17 1318 Oral     SpO2 07/06/17 1318 95 %     Weight 07/06/17 1316 175 lb (79.4 kg)     Height 07/06/17 1316 5\' 6"  (1.676 m)     Head Circumference --      Peak Flow --      Pain Score 07/06/17 1315 0     Pain Loc --      Pain Edu? --      Excl. in [place]? --     Constitutional: Alert, but not oriented. Answers questions and follows commands although her answers are inaccurate at times. Eyes: Normal exam ENT   Head: Normocephalic and atraumatic.   Mouth/Throat: Mucous membranes are moist. Cardiovascular: Normal rate, regular rhythm. Respiratory: Normal respiratory effort without tachypnea nor retractions. Breath sounds are clear  Gastrointestinal: Soft and nontender. No distention.   Musculoskeletal: Nontender with normal range of motion in all extremities.  Neurologic:  Confusion with mild aphasia family states this is normal. Moves all extremities apparently equal. Skin:  Skin is warm, dry and intact.  Psychiatric: Mood and affect are normal  ____________________________________________    EKG  EKG reviewed and interpreted by myself shows normal sinus rhythm at 81 bpm, narrow  QRS, normal axis, normal intervals, nonspecific ST changes.  ____________________________________________    RADIOLOGY   IMPRESSION: No acute abnormality. Progressive mild diffuse cerebral and cerebellar atrophy and moderate chronic small vessel white matter ischemic changes in both cerebral hemispheres.  ____________________________________________   INITIAL IMPRESSION / ASSESSMENT AND PLAN / ED COURSE  Pertinent labs & imaging results that were available during my care of the patient were reviewed by me and considered in my medical decision making (see chart for details).  Patient presents to the emergency department for altered mental status worse over the past 3 days. On exam patient is awake alert following commands and answering questions although is confused. Family states the patient has been hallucinating since yesterday speaking to people who were not in the room. Patient's medical workup shows a significant urinary tract infection.  CT head is negative. Labs are significant urinary tract infection. We will send a urine culture, start on IV antibiotics given significant confusion.  ____________________________________________   FINAL CLINICAL IMPRESSION(S) / ED DIAGNOSES  Confusion at sign urinary tract infection    Harvest Dark, MD 07/06/17 1531

## 2017-07-06 NOTE — ED Triage Notes (Signed)
Pt arrived via ems for altered mental status - the sister states that the change started Sunday - pt is confused and combative with family - pt is unable to answer questions appropriately - she states that she knows what you are asking her and that she is not responding appropriately - pt sister reports that she has a foul odor to her urine and that she had a UTI in May

## 2017-07-06 NOTE — Telephone Encounter (Signed)
Patient Name: Tiffany Arnold  DOB: [birth year]    Initial Comment Caller's sister has a strong urine odor. She always is a little confused but she is more confused today then she has been.    Nurse Assessment  Nurse: Raphael Gibney, RN, Vanita Ingles Date/Time (Eastern Time): 07/06/2017 11:09:18 AM  Confirm and document reason for call. If symptomatic, describe symptoms. ---Caller states her sister had UTI in May and had to be hospitalized. She is a little confused all the time. Urine has a strong odor. She is more confused today. Has been incontinent of urine. Not sure if she has a fever.  Does the patient have any new or worsening symptoms? ---Yes  Will a triage be completed? ---Yes  Related visit to physician within the last 2 weeks? ---No  Does the PT have any chronic conditions? (i.e. diabetes, asthma, etc.) ---Yes  List chronic conditions. ---confusion; DJD;  Is this a behavioral health or substance abuse call? ---No     Guidelines    Guideline Title Affirmed Question Affirmed Notes  Confusion - Delirium [1] Longstanding confusion (e.g., dementia, stroke) AND [2] worsening   Urinary Symptoms Bad or foul-smelling urine    Final Disposition User   See Physician within 4 Hours (or PCP triage) Raphael Gibney, RN, [place]    Comments  No appts available at [facility] or [facility]. Sister states she will take her to the ER at [facility] - ED   Disagree/Comply: Comply    Disagree/Comply: Comply

## 2017-07-07 ENCOUNTER — Telehealth: Payer: Self-pay | Admitting: Neurology

## 2017-07-07 ENCOUNTER — Telehealth: Payer: Self-pay | Admitting: Internal Medicine

## 2017-07-07 DIAGNOSIS — Z87891 Personal history of nicotine dependence: Secondary | ICD-10-CM | POA: Diagnosis not present

## 2017-07-07 DIAGNOSIS — G4733 Obstructive sleep apnea (adult) (pediatric): Secondary | ICD-10-CM | POA: Diagnosis not present

## 2017-07-07 DIAGNOSIS — N390 Urinary tract infection, site not specified: Secondary | ICD-10-CM | POA: Diagnosis not present

## 2017-07-07 DIAGNOSIS — Z96653 Presence of artificial knee joint, bilateral: Secondary | ICD-10-CM | POA: Diagnosis not present

## 2017-07-07 DIAGNOSIS — R4182 Altered mental status, unspecified: Secondary | ICD-10-CM | POA: Diagnosis not present

## 2017-07-07 DIAGNOSIS — E785 Hyperlipidemia, unspecified: Secondary | ICD-10-CM | POA: Diagnosis not present

## 2017-07-07 DIAGNOSIS — Z66 Do not resuscitate: Secondary | ICD-10-CM | POA: Diagnosis not present

## 2017-07-07 DIAGNOSIS — G9340 Encephalopathy, unspecified: Secondary | ICD-10-CM | POA: Diagnosis not present

## 2017-07-07 DIAGNOSIS — K219 Gastro-esophageal reflux disease without esophagitis: Secondary | ICD-10-CM | POA: Diagnosis not present

## 2017-07-07 DIAGNOSIS — G9341 Metabolic encephalopathy: Secondary | ICD-10-CM | POA: Diagnosis not present

## 2017-07-07 DIAGNOSIS — L8990 Pressure ulcer of unspecified site, unspecified stage: Secondary | ICD-10-CM | POA: Insufficient documentation

## 2017-07-07 DIAGNOSIS — I10 Essential (primary) hypertension: Secondary | ICD-10-CM | POA: Diagnosis not present

## 2017-07-07 DIAGNOSIS — Z79899 Other long term (current) drug therapy: Secondary | ICD-10-CM | POA: Diagnosis not present

## 2017-07-07 DIAGNOSIS — R4701 Aphasia: Secondary | ICD-10-CM

## 2017-07-07 LAB — CBC
HEMATOCRIT: 27.4 % — AB (ref 35.0–47.0)
Hemoglobin: 8.8 g/dL — ABNORMAL LOW (ref 12.0–16.0)
MCH: 24.5 pg — ABNORMAL LOW (ref 26.0–34.0)
MCHC: 32 g/dL (ref 32.0–36.0)
MCV: 76.5 fL — AB (ref 80.0–100.0)
Platelets: 419 10*3/uL (ref 150–440)
RBC: 3.58 MIL/uL — ABNORMAL LOW (ref 3.80–5.20)
RDW: 17.4 % — AB (ref 11.5–14.5)
WBC: 9.4 10*3/uL (ref 3.6–11.0)

## 2017-07-07 MED ORDER — ENSURE ENLIVE PO LIQD
237.0000 mL | Freq: Three times a day (TID) | ORAL | Status: DC
  Administered 2017-07-07 – 2017-07-09 (×5): 237 mL via ORAL

## 2017-07-07 NOTE — Telephone Encounter (Signed)
Spoke to [place] per dpr.  We received FL2 form from [facility].  Rollene Fare stated since pt is in [place] the social worker there should fill out form for pt

## 2017-07-07 NOTE — Care Management Note (Addendum)
Case Management Note  Patient Details  Name: JEROLYN FLENNIKEN MRN: [id number] Date of Birth: Jan 23, 1941  Subjective/Objective:                 Attempt to speak with patient to discuss disposition. Unable to complete assessment. Patient remains confused; stating her first and last name are both Ludmila, she states she lives at home with her mother (patient is 76 years old) and is un oriented to time or place. Patient is admitted with acute encephalopathy. Per admission summary family has establish 2 weeks at [facility]. CM placed CSW consult to assist with potential SNF DC. Verified active w [facility] pta for [facility] SLP.    Name Relation Home Work [place] Sister [phone]  [phone]  Honi, Name Spouse [phone]    Carlton,Cam Niece [phone]  [phone]  Epimenio Foot Daughter [phone]        Action/Plan:   Expected Discharge Date:  07/08/17               Expected Discharge Plan:     In-House Referral:  Clinical Social Work  Discharge planning Services  CM Consult  Post Acute Care Choice:    Choice offered to:     DME Arranged:    DME Agency:     HH Arranged:    [place] Agency:     Status of Service:  In process, will continue to follow  If discussed at Long Length of Stay Meetings, dates discussed:    Additional Comments:  Carles Collet, RN 07/07/2017, 2:55 PM

## 2017-07-07 NOTE — Telephone Encounter (Signed)
Pt sister(on DPR) calling to inform that pt is in hospital as of yesterday and the MRI that Dr [name] wanted to have done with contrast was done yesterday evening.  Pt sister said she can be reached to discusss

## 2017-07-07 NOTE — Plan of Care (Signed)
Problem: Education: Goal: Knowledge of [facility] Education information/materials will improve Outcome: Progressing VSS, free of falls during shift.  Denies pain.  No needs overnight.  CPAP in place.  Bed in low position, call bell within reach.  WCTM.

## 2017-07-07 NOTE — Progress Notes (Signed)
Initial Nutrition Assessment  DOCUMENTATION CODES:   Not applicable  INTERVENTION:  Provide Ensure Enlive po TID, each supplement provides 350 kcal and 20 grams of protein.   Encouraged intake of adequate calories and protein to prevent further loss of body weight. Patient will likely need encouragement at each meal in setting of her confusion.   NUTRITION DIAGNOSIS:   Inadequate oral intake related to lethargy/confusion as evidenced by meal completion < 50%.  GOAL:   Patient will meet greater than or equal to 90% of their needs  MONITOR:   PO intake, Supplement acceptance, Labs, Weight trends, I & O's  REASON FOR ASSESSMENT:   Malnutrition Screening Tool    ASSESSMENT:   76 year old female with PMHx of asthma, arthritis, HTN, HLD, cataract of both eyes, GERD, IBS, hx of breast cancer s/p lumpectomy of right breast and chemotherapy/XRT, who presented with AMS found to have UTI.    -MRI Brain completed today showing no acute intracranial process or metastasis.   Spoke with patient at bedside. She is confused, but is able to answer some questions. She reports her appetite is good. She reports finishing 100% of meals typically. Patient reports she has had intentional weight loss lately. She has lost weight by eating healthier foods and choosing smaller portions. She does not drink any oral nutrition supplements.   Per chart patient was 248.6 lbs on 10/21/2016 and has lost 80.3 lbs (32.3% body weight) over the past 9 months, which is significant for time frame, especially given history of breast cancer.  Meal Completion: 30% of lunch today per chart  Medications reviewed and include: calcium/vitamin D 1 tablet daily (500 mg calcium, 200 units vit D), sertraline, vitamin B12 1000 micrograms daily, ceftriaxone.  Labs reviewed: Chloride 100, Bun 25.   Nutrition-Focused physical exam completed. Findings are mild-moderate fat depletion in upper arm region, no muscle depletion, and  no edema.   Patient is at risk for malnutrition due to her significant weight loss. However, not able to obtain good nutrition history due to patient's confusion. No significant wasting on exam.  Diet Order:  Diet regular Room service appropriate? Yes; Fluid consistency: Thin  Skin:  Wound (see comment) (Stg II left buttocks)  Last BM:  07/07/2017 - type 6  Height:   Ht Readings from Last 1 Encounters:  07/06/17 5\' 3"  (1.6 m)    Weight:   Wt Readings from Last 1 Encounters:  07/06/17 168 lb 4.8 oz (76.3 kg)    Ideal Body Weight:  52.3 kg  BMI:  Body mass index is 29.81 kg/m.  Estimated Nutritional Needs:   Kcal:  1490-1740 (MSJ x 1.2-1.4)  Protein:  76-92 grams (1-1.2 grams/kg)  Fluid:  1.9 L/day (25 ml/kg)  EDUCATION NEEDS:   Education needs no appropriate at this time (AMS)  Willey Blade, MS, RD, LDN Pager: [phone] After Hours Pager: [phone]

## 2017-07-07 NOTE — Progress Notes (Signed)
[facility] at [place] NAME: Tiffany Arnold    MR#:  [id number]  DATE OF BIRTH:  July 07, 1941  SUBJECTIVE:   Came in with increasing confusion and weakaness.  Today up in the bed and eating lunch. Answers most questions appropriately REVIEW OF SYSTEMS:   Review of Systems  Constitutional: Negative for chills, fever and weight loss.  HENT: Negative for ear discharge, ear pain and nosebleeds.   Eyes: Negative for blurred vision, pain and discharge.  Respiratory: Negative for sputum production, shortness of breath, wheezing and stridor.   Cardiovascular: Negative for chest pain, palpitations, orthopnea and PND.  Gastrointestinal: Negative for abdominal pain, diarrhea, nausea and vomiting.  Genitourinary: Negative for frequency and urgency.  Musculoskeletal: Negative for back pain and joint pain.  Neurological: Positive for weakness. Negative for sensory change, speech change and focal weakness.  Psychiatric/Behavioral: Negative for depression and hallucinations. The patient is not nervous/anxious.    Tolerating Diet:yes Tolerating PT: pending  DRUG ALLERGIES:   Allergies  Allergen Reactions  . Other Swelling and Shortness Of Breath    Cats, dogs, mold Induced asthma Cats, dogs, mold    VITALS:  Blood pressure (!) 159/77, pulse 78, temperature 98.8 F (37.1 C), temperature source Oral, resp. rate 16, height 5\' 3"  (1.6 m), weight 76.3 kg (168 lb 4.8 oz), SpO2 96 %.  PHYSICAL EXAMINATION:   Physical Exam  GENERAL:  76 y.o.-year-old patient lying in the bed with no acute distress.  EYES: Pupils equal, round, reactive to light and accommodation. No scleral icterus. Extraocular muscles intact.  HEENT: Head atraumatic, normocephalic. Oropharynx and nasopharynx clear.  NECK:  Supple, no jugular venous distention. No thyroid enlargement, no tenderness.  LUNGS: Normal breath sounds bilaterally, no wheezing, rales, rhonchi. No use of  accessory muscles of respiration.  CARDIOVASCULAR: S1, S2 normal. No murmurs, rubs, or gallops.  ABDOMEN: Soft, nontender, nondistended. Bowel sounds present. No organomegaly or mass.  EXTREMITIES: No cyanosis, clubbing or edema b/l.    NEUROLOGIC: Cranial nerves II through XII are intact. No focal Motor or sensory deficits b/l.   PSYCHIATRIC:  patient is alert and oriented x 3.  SKIN: No obvious rash, lesion, or ulcer.   LABORATORY PANEL:  CBC  Recent Labs Lab 07/07/17 0419  WBC 9.4  HGB 8.8*  HCT 27.4*  PLT 419    Chemistries   Recent Labs Lab 07/06/17 1318  NA 138  K 4.5  CL 100*  CO2 28  GLUCOSE 141*  BUN 25*  CREATININE 0.65  CALCIUM 10.4*  AST 27  ALT 12*  ALKPHOS 83  BILITOT 1.0   Cardiac Enzymes No results for input(s): TROPONINI in the last 168 hours. RADIOLOGY:  Ct Head Wo Contrast  Result Date: 07/06/2017 CLINICAL DATA:  Altered mental status. EXAM: CT HEAD WITHOUT CONTRAST TECHNIQUE: Contiguous axial images were obtained from the base of the skull through the vertex without intravenous contrast. COMPARISON:  09/14/2012. FINDINGS: Brain: Diffusely enlarged ventricles and subarachnoid spaces. Patchy white matter low density in both cerebral hemispheres. No intracranial hemorrhage, mass lesion or CT evidence of acute infarction. Vascular: No hyperdense vessel or unexpected calcification. Skull: Normal. Negative for fracture or focal lesion. Sinuses/Orbits: Surgical absence of the medial walls of both maxillary sinuses. Unremarkable orbits. Other: None. IMPRESSION: No acute abnormality. Progressive mild diffuse cerebral and cerebellar atrophy and moderate chronic small vessel white matter ischemic changes in both cerebral hemispheres. Electronically Signed   By: Percell Locus.D.  On: 07/06/2017 13:54   Mr Jeri Cos HG Contrast  Result Date: 07/07/2017 CLINICAL DATA:  Altered mental status, evaluate acute encephalopathy. History of breast can for Ing concern for  metastasis. History of recurrent urinary tract infections, hypertension. EXAM: MRI HEAD WITHOUT AND WITH CONTRAST TECHNIQUE: Multiplanar, multiecho pulse sequences of the brain and surrounding structures were obtained without and with intravenous contrast. CONTRAST:  80mL MULTIHANCE GADOBENATE DIMEGLUMINE 529 MG/ML IV SOLN COMPARISON:  CT HEAD July 06, 2017 at 1344 hours and CT HEAD September 14, 2012 FINDINGS: BRAIN: No reduced diffusion to suggest acute ischemia or hypercellular tumor . No susceptibility artifact to suggest hemorrhage. The ventricles and sulci are normal for patient's age. Patchy to confluent supratentorial pontine white matter FLAIR T2 hyperintensities. No suspicious parenchymal signal, mass or mass effect. Old small RIGHT cerebellar infarct. No abnormal intraparenchymal or extra-axial enhancement No abnormal extra-axial fluid collections. VASCULAR: Normal major intracranial vascular flow voids present at skull base. SKULL AND UPPER CERVICAL SPINE: No abnormal sellar expansion. No suspicious calvarial bone marrow signal. 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 Craniocervical junction maintained. SINUSES/ORBITS: The mastoid air-cells and included paranasal sinuses are well-aerated. The included ocular globes and orbital contents are non-suspicious. Status post LEFT ocular lens implant. OTHER: None. IMPRESSION: 1. No acute intracranial process or metastasis. 2. Moderate chronic small vessel ischemic disease. 3. Old small RIGHT cerebellar infarct. Electronically Signed   By: Elon Alas M.D.   On: 07/07/2017 00:37   ASSESSMENT AND PLAN:   Tiffany Arnold is a 76 y.o. female with a past medical history of gastric reflux, arthritis, hypertension, hyperlipidemia, confusion/memory deficits, presents the emergency department for altered mental status. Family states since May  the patient has had mild continued confusion. They  state over the past 2-3 days ago confusion has worsened significantly, patient is speaking to people who were not in the room/hallucinating  1. Acute on chronic encephalopathy. She has worsening mental status likely due to the infection. We'll treat the infection. MRI brain neg for metastasis, small old right cerebellar infarct Appears alert and oriented x2 today. Eating lunch, sister in the room  2. Urinary tract infection. We'll go ahead and start IV Rocephin. Get urine cultures.  3. Hypertension.  -ovrall bp ok..the patient not on any meds  4. Hyperlipidemia. We'll continue current medications  5. PT to see pt  Sister would like pt to go to rehab if able to else she is looking into ALF for her  Case discussed with Care Management/Social Worker. Management plans discussed with the patient, family and they are in agreement.  CODE STATUS: DNR  DVT Prophylaxis: lovenox  TOTAL TIME TAKING CARE OF THIS PATIENT: *30* minutes.  >50% time spent on counselling and coordination of care  POSSIBLE D/C IN 1-2 DAYS, DEPENDING ON CLINICAL CONDITION.  Note: This dictation was prepared with Dragon dictation along with smaller phrase technology. Any transcriptional errors that result from this process are unintentional.  Tiffany Arnold M.D on 07/07/2017 at 5:04 PM  Between 7am to 6pm - Pager - [phone]  After 6pm go to [email or web address] - password [email or web address] [facility]  Office  [phone]  CC: Primary care physician; [name], NP

## 2017-07-07 NOTE — Telephone Encounter (Signed)
Pt was in ER, notes reviewed.

## 2017-07-07 NOTE — Telephone Encounter (Signed)
I called the sister. MRI the brain does not show any process that would have led to a persistent aphasia on the part of the patient.  The patient is more confused currently with the urinary tract infection. I will order an outpatient EEG study, they may be able to do a lumbar puncture with cytology while she is in the hospital. I'll try to send her treating physician of note.  MRI brain 07/06/17:  IMPRESSION: 1. No acute intracranial process or metastasis. 2. Moderate chronic small vessel ischemic disease. 3. Old small RIGHT cerebellar infarct.

## 2017-07-07 NOTE — Addendum Note (Signed)
Addended by: Kathrynn Ducking on: 07/07/2017 04:38 PM   Modules accepted: Orders

## 2017-07-08 ENCOUNTER — Inpatient Hospital Stay: Payer: Medicare Other

## 2017-07-08 DIAGNOSIS — E785 Hyperlipidemia, unspecified: Secondary | ICD-10-CM | POA: Diagnosis not present

## 2017-07-08 DIAGNOSIS — N390 Urinary tract infection, site not specified: Secondary | ICD-10-CM | POA: Diagnosis not present

## 2017-07-08 DIAGNOSIS — G9340 Encephalopathy, unspecified: Secondary | ICD-10-CM | POA: Diagnosis not present

## 2017-07-08 DIAGNOSIS — Z87891 Personal history of nicotine dependence: Secondary | ICD-10-CM | POA: Diagnosis not present

## 2017-07-08 DIAGNOSIS — G9341 Metabolic encephalopathy: Secondary | ICD-10-CM | POA: Diagnosis not present

## 2017-07-08 DIAGNOSIS — R410 Disorientation, unspecified: Secondary | ICD-10-CM | POA: Diagnosis not present

## 2017-07-08 DIAGNOSIS — Z66 Do not resuscitate: Secondary | ICD-10-CM | POA: Diagnosis not present

## 2017-07-08 DIAGNOSIS — Z79899 Other long term (current) drug therapy: Secondary | ICD-10-CM | POA: Diagnosis not present

## 2017-07-08 DIAGNOSIS — Z96653 Presence of artificial knee joint, bilateral: Secondary | ICD-10-CM | POA: Diagnosis not present

## 2017-07-08 DIAGNOSIS — R4182 Altered mental status, unspecified: Secondary | ICD-10-CM | POA: Diagnosis not present

## 2017-07-08 DIAGNOSIS — K219 Gastro-esophageal reflux disease without esophagitis: Secondary | ICD-10-CM | POA: Diagnosis not present

## 2017-07-08 DIAGNOSIS — I10 Essential (primary) hypertension: Secondary | ICD-10-CM | POA: Diagnosis not present

## 2017-07-08 LAB — CSF CELL COUNT WITH DIFFERENTIAL
Eosinophils, CSF: 0 %
Lymphs, CSF: 76 %
MONOCYTE-MACROPHAGE-SPINAL FLUID: 22 %
RBC COUNT CSF: 16 /mm3 — AB (ref 0–3)
Segmented Neutrophils-CSF: 2 %
Tube #: 3
WBC CSF: 2 /mm3 (ref 0–5)

## 2017-07-08 LAB — GLUCOSE, CSF: GLUCOSE CSF: 64 mg/dL (ref 40–70)

## 2017-07-08 LAB — PROTIME-INR
INR: 1.02
PROTHROMBIN TIME: 13.4 s (ref 11.4–15.2)

## 2017-07-08 LAB — PROTEIN, CSF: Total  Protein, CSF: 85 mg/dL — ABNORMAL HIGH (ref 15–45)

## 2017-07-08 LAB — APTT: aPTT: 35 seconds (ref 24–36)

## 2017-07-08 MED ORDER — CYCLOBENZAPRINE HCL 10 MG PO TABS
5.0000 mg | ORAL_TABLET | Freq: Once | ORAL | Status: AC
  Administered 2017-07-08: 5 mg via ORAL
  Filled 2017-07-08: qty 1

## 2017-07-08 MED ORDER — LIDOCAINE HCL (PF) 1 % IJ SOLN
10.0000 mL | Freq: Once | INTRAMUSCULAR | Status: DC
  Filled 2017-07-08: qty 10

## 2017-07-08 MED ORDER — CEPHALEXIN 500 MG PO CAPS
500.0000 mg | ORAL_CAPSULE | Freq: Two times a day (BID) | ORAL | Status: DC
  Administered 2017-07-08 – 2017-07-09 (×3): 500 mg via ORAL
  Filled 2017-07-08 (×3): qty 1

## 2017-07-08 NOTE — Progress Notes (Signed)
Pt is DNR -purple armband in place. Pt is a high fall risk- yellow armband in place. Pt has allergies-Red armband in place.

## 2017-07-08 NOTE — Clinical Social Work Placement (Signed)
   CLINICAL SOCIAL WORK PLACEMENT  NOTE  Date:  07/08/2017  Patient Details  Name: Tiffany Arnold MRN: [id number] Date of Birth: March 29, 1941  Clinical Social Work is seeking post-discharge placement for this patient at the [place] level of care (*CSW will initial, date and re-position this form in  chart as items are completed):  Yes   Patient/family provided with [facility] Work Department's list of facilities offering this level of care within the geographic area requested by the patient (or if unable, by the patient's family).  Yes   Patient/family informed of their freedom to choose among providers that offer the needed level of care, that participate in Medicare, Medicaid or managed care program needed by the patient, have an available bed and are willing to accept the patient.  Yes   Patient/family informed of [facility]'s ownership interest in [facility] and [facility], as well as of the fact that they are under no obligation to receive care at these facilities.  PASRR submitted to EDS on 07/08/17     PASRR number received on 07/08/17     Existing PASRR number confirmed on       FL2 transmitted to all facilities in geographic area requested by pt/family on 07/08/17     FL2 transmitted to all facilities within larger geographic area on       Patient informed that his/her managed care company has contracts with or will negotiate with certain facilities, including the following:            Patient/family informed of bed offers received.  Patient chooses bed at       Physician recommends and patient chooses bed at      Patient to be transferred to   on  .  Patient to be transferred to facility by       Patient family notified on   of transfer.  Name of family member notified:        PHYSICIAN       Additional Comment:    _______________________________________________ Darden Dates, LCSW 07/08/2017, 3:45 PM

## 2017-07-08 NOTE — Progress Notes (Signed)
[place] at [place] NAME: Tiffany Arnold    MR#:  [id number]  DATE OF BIRTH:  Nov 14, 1941  SUBJECTIVE:   Appears awake and alert. Confused at baseline. Eating breakfast. Denies any complaints. REVIEW OF SYSTEMS:   Review of Systems  Constitutional: Negative for chills, fever and weight loss.  HENT: Negative for ear discharge, ear pain and nosebleeds.   Eyes: Negative for blurred vision, pain and discharge.  Respiratory: Negative for sputum production, shortness of breath, wheezing and stridor.   Cardiovascular: Negative for chest pain, palpitations, orthopnea and PND.  Gastrointestinal: Negative for abdominal pain, diarrhea, nausea and vomiting.  Genitourinary: Negative for frequency and urgency.  Musculoskeletal: Negative for back pain and joint pain.  Neurological: Positive for weakness. Negative for sensory change, speech change and focal weakness.  Psychiatric/Behavioral: Negative for depression and hallucinations. The patient is not nervous/anxious.    Tolerating Diet:yes Tolerating PT: Rehabilitation  DRUG ALLERGIES:   Allergies  Allergen Reactions  . Other Swelling and Shortness Of Breath    Cats, dogs, mold Induced asthma Cats, dogs, mold    VITALS:  Blood pressure (!) 160/61, pulse (!) 58, temperature 98.2 F (36.8 C), temperature source Oral, resp. rate 20, height 5\' 3"  (1.6 m), weight 76.3 kg (168 lb 4.8 oz), SpO2 99 %.  PHYSICAL EXAMINATION:   Physical Exam  GENERAL:  76 y.o.-year-old patient lying in the bed with no acute distress.  EYES: Pupils equal, round, reactive to light and accommodation. No scleral icterus. Extraocular muscles intact.  HEENT: Head atraumatic, normocephalic. Oropharynx and nasopharynx clear.  NECK:  Supple, no jugular venous distention. No thyroid enlargement, no tenderness.  LUNGS: Normal breath sounds bilaterally, no wheezing, rales, rhonchi. No use of accessory muscles of  respiration.  CARDIOVASCULAR: S1, S2 normal. No murmurs, rubs, or gallops.  ABDOMEN: Soft, nontender, nondistended. Bowel sounds present. No organomegaly or mass.  EXTREMITIES: No cyanosis, clubbing or edema b/l.    NEUROLOGIC: Cranial nerves II through XII are intact. No focal Motor or sensory deficits b/l.   PSYCHIATRIC:  patient is alert and oriented x 3.  SKIN: No obvious rash, lesion, or ulcer.   LABORATORY PANEL:  CBC  Recent Labs Lab 07/07/17 0419  WBC 9.4  HGB 8.8*  HCT 27.4*  PLT 419    Chemistries   Recent Labs Lab 07/06/17 1318  NA 138  K 4.5  CL 100*  CO2 28  GLUCOSE 141*  BUN 25*  CREATININE 0.65  CALCIUM 10.4*  AST 27  ALT 12*  ALKPHOS 83  BILITOT 1.0   Cardiac Enzymes No results for input(s): TROPONINI in the last 168 hours. RADIOLOGY:  Ct Head Wo Contrast  Result Date: 07/06/2017 CLINICAL DATA:  Altered mental status. EXAM: CT HEAD WITHOUT CONTRAST TECHNIQUE: Contiguous axial images were obtained from the base of the skull through the vertex without intravenous contrast. COMPARISON:  09/14/2012. FINDINGS: Brain: Diffusely enlarged ventricles and subarachnoid spaces. Patchy white matter low density in both cerebral hemispheres. No intracranial hemorrhage, mass lesion or CT evidence of acute infarction. Vascular: No hyperdense vessel or unexpected calcification. Skull: Normal. Negative for fracture or focal lesion. Sinuses/Orbits: Surgical absence of the medial walls of both maxillary sinuses. Unremarkable orbits. Other: None. IMPRESSION: No acute abnormality. Progressive mild diffuse cerebral and cerebellar atrophy and moderate chronic small vessel white matter ischemic changes in both cerebral hemispheres. Electronically Signed   By: Claudie Revering M.D.   On: 07/06/2017 13:54   Mr Brain  W Wo Contrast  Result Date: 07/07/2017 CLINICAL DATA:  Altered mental status, evaluate acute encephalopathy. History of breast can for Ing concern for metastasis. History  of recurrent urinary tract infections, hypertension. EXAM: MRI HEAD WITHOUT AND WITH CONTRAST TECHNIQUE: Multiplanar, multiecho pulse sequences of the brain and surrounding structures were obtained without and with intravenous contrast. CONTRAST:  90mL MULTIHANCE GADOBENATE DIMEGLUMINE 529 MG/ML IV SOLN COMPARISON:  CT HEAD July 06, 2017 at 1344 hours and CT HEAD September 14, 2012 FINDINGS: BRAIN: No reduced diffusion to suggest acute ischemia or hypercellular tumor . No susceptibility artifact to suggest hemorrhage. The ventricles and sulci are normal for patient's age. Patchy to confluent supratentorial pontine white matter FLAIR T2 hyperintensities. No suspicious parenchymal signal, mass or mass effect. Old small RIGHT cerebellar infarct. No abnormal intraparenchymal or extra-axial enhancement No abnormal extra-axial fluid collections. VASCULAR: Normal major intracranial vascular flow voids present at skull base. SKULL AND UPPER CERVICAL SPINE: No abnormal sellar expansion. No suspicious calvarial bone marrow signal. 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 Craniocervical junction maintained. SINUSES/ORBITS: The mastoid air-cells and included paranasal sinuses are well-aerated. The included ocular globes and orbital contents are non-suspicious. Status post LEFT ocular lens implant. OTHER: None. IMPRESSION: 1. No acute intracranial process or metastasis. 2. Moderate chronic small vessel ischemic disease. 3. Old small RIGHT cerebellar infarct. Electronically Signed   By: Elon Alas M.D.   On: 07/07/2017 00:37   ASSESSMENT AND PLAN:   Tiffany Arnold is a 76 y.o. female with a past medical history of gastric reflux, arthritis, hypertension, hyperlipidemia, confusion/memory deficits, presents the emergency department for altered mental status. Family states since May  the patient has had mild continued confusion. They state over the past  2-3 days ago confusion has worsened significantly, patient is speaking to people who were not in the room/hallucinating  1. Acute on chronic encephalopathy. She has worsening mental status likely due to the infection. We'll treat the infection. MRI brain neg for metastasis, small old right cerebellar infarct Appears alert and oriented x2 today.   2. Urinary tract infection.  -Received IV Rocephin we'll change to oral Ceftin. Patient is afebrile.  3. Hypertension.  -ovrall bp ok..the patient not on any meds  4. Hyperlipidemia. We'll continue current medications  5. PTrecommends rehabilitation.   Social worker for discharge planning.  Patient's sister [name] informed about discharge planning and she is agreeable with the patient going to rehabilitation. Awaiting insurance authorization. Patient is medically stable for discharge.  Case discussed with Care Management/Social Worker. Management plans discussed with the patient, family and they are in agreement.  CODE STATUS: DNR  DVT Prophylaxis: lovenox  TOTAL TIME TAKING CARE OF THIS PATIENT: *30* minutes.  >50% time spent on counselling and coordination of care  POSSIBLE D/C IN 1-2 DAYS, DEPENDING ON CLINICAL CONDITION.  Note: This dictation was prepared with Dragon dictation along with smaller phrase technology. Any transcriptional errors that result from this process are unintentional.  Ann Bohne M.D on 07/08/2017 at 10:10 AM  Between 7am to 6pm - Pager - [phone]  After 6pm go to [email or web address] - password [email or web address] [facility]  Office  [phone]  CC: Primary care physician; [name], NP

## 2017-07-08 NOTE — Progress Notes (Signed)
[facility]  Patient Status: Active  [facility] is providing the following services: ST  If patient discharges after hours, please call [phone].   Florene Glen 07/08/2017, 9:26 AM

## 2017-07-08 NOTE — NC FL2 (Signed)
[place] LEVEL OF CARE SCREENING TOOL     IDENTIFICATION  Patient Name: Tiffany Arnold Birthdate: 21-May-1941 Sex: female Admission Date (Current Location): 07/06/2017  [facility] and [place] Number:  Engineering geologist and Address:  [facility], [address] [place], [place] [address]      Provider Number: [id number]  Attending Physician Name and Address:  Fritzi Mandes, MD  Relative Name and Phone Number:       Current Level of Care: Hospital Recommended Level of Care: [place] Prior Approval Number:    Date Approved/Denied:   PASRR Number:    Discharge Plan: SNF    Current Diagnoses: Patient Active Problem List   Diagnosis Date Noted  . Pressure injury of skin 07/07/2017  . Metabolic encephalopathy 62/22/9798  . Memory disorder 06/22/2017  . Aphasia 06/22/2017  . Osteopenia 04/01/2017  . Port catheter in place 12/25/2016  . IBS (irritable bowel syndrome) 11/25/2016  . Breast cancer of lower-inner quadrant of right female breast ([place]) 06/27/2016  . OSA on CPAP 06/26/2016  . Type 2 diabetes mellitus without complication ([place]) 92/10/9416  . Severe obesity (BMI >= 40) ([place]) 09/04/2014  . Essential hypertension 09/04/2014  . HLD (hyperlipidemia) 09/04/2014  . Gastroesophageal reflux disease without esophagitis 09/04/2014  . Asthma, mild persistent 09/04/2014  . Seasonal allergies 09/04/2014  . Generalized anxiety disorder 09/04/2014    Orientation RESPIRATION BLADDER Height & Weight     Self  Normal Incontinent Weight: 168 lb 4.8 oz (76.3 kg) Height:  5\' 3"  (160 cm)  BEHAVIORAL SYMPTOMS/MOOD NEUROLOGICAL BOWEL NUTRITION STATUS      Incontinent Diet (Regular Diet)  AMBULATORY STATUS COMMUNICATION OF NEEDS Skin   Extensive Assist Verbally Normal                       Personal Care Assistance Level of Assistance  Bathing, Feeding, Dressing Bathing Assistance: Limited assistance Feeding  assistance: Independent Dressing Assistance: Limited assistance     Functional Limitations Info  Sight, Hearing, Speech Sight Info: Adequate Hearing Info: Adequate Speech Info: Adequate    SPECIAL CARE FACTORS FREQUENCY  PT (By licensed PT)     PT Frequency: 5              Contractures Contractures Info: Not present    Additional Factors Info  Code Status, Allergies, Psychotropic Code Status Info: DNR Allergies Info: Other Psychotropic Info: Medication:  Zoloft         Current Medications (07/08/2017):  This is the current hospital active medication list Current Facility-Administered Medications  Medication Dose Route Frequency Provider Last Rate Last Dose  . 0.9 %  sodium chloride infusion  250 mL Intravenous PRN Baxter Hire, MD      . acetaminophen (TYLENOL) tablet 650 mg  650 mg Oral Q6H PRN Baxter Hire, MD       Or  . acetaminophen (TYLENOL) suppository 650 mg  650 mg Rectal Q6H PRN Baxter Hire, MD      . ALPRAZolam Duanne Moron) tablet 0.25 mg  0.25 mg Oral BID PRN Baxter Hire, MD      . calcium-vitamin D 500-200 MG-UNIT per tablet 1 tablet  1 tablet Oral Daily Baxter Hire, MD   1 tablet at 07/07/17 [id number]  . cephALEXin (KEFLEX) capsule 500 mg  500 mg Oral Q12H Fritzi Mandes, MD      . diphenoxylate-atropine (LOMOTIL) 2.5-0.025 MG per tablet 2 tablet  2 tablet  Oral QID PRN Baxter Hire, MD      . feeding supplement (ENSURE ENLIVE) (ENSURE ENLIVE) liquid 237 mL  237 mL Oral TID BM Fritzi Mandes, MD   237 mL at 07/07/17 2057  . heparin injection 5,000 Units  5,000 Units Subcutaneous Q8H Baxter Hire, MD   5,000 Units at 07/08/17 [id number]  . ipratropium-albuterol (DUONEB) 0.5-2.5 (3) MG/3ML nebulizer solution 3 mL  3 mL Nebulization Q4H PRN Baxter Hire, MD      . letrozole [facility]) tablet 2.5 mg  2.5 mg Oral Daily Baxter Hire, MD   2.5 mg at 07/07/17 0947  . loratadine (CLARITIN) tablet 10 mg  10 mg Oral Daily Baxter Hire, MD   10 mg at  07/07/17 0947  . sertraline (ZOLOFT) tablet 25 mg  25 mg Oral Daily Baxter Hire, MD   25 mg at 07/07/17 0947  . sodium chloride flush (NS) 0.9 % injection 3 mL  3 mL Intravenous Q12H Baxter Hire, MD   3 mL at 07/07/17 2056  . sodium chloride flush (NS) 0.9 % injection 3 mL  3 mL Intravenous PRN Baxter Hire, MD      . vitamin B-12 (CYANOCOBALAMIN) tablet 1,000 mcg  1,000 mcg Oral Daily Baxter Hire, MD   1,000 mcg at 07/07/17 5183     Discharge Medications: Please see discharge summary for a list of discharge medications.  Relevant Imaging Results:  Relevant Lab Results:   Additional Information SSN:  [id number]  Darden Dates, LCSW

## 2017-07-08 NOTE — Clinical Social Work Note (Signed)
Clinical Social Work Assessment  Patient Details  Name: Tiffany Arnold MRN: [id number] Date of Birth: 1941-03-08  Date of referral:  07/08/17               Reason for consult:  Facility Placement, Discharge Planning                Permission sought to share information with:  Family Supports Permission granted to share information::  Yes, Verbal Permission Granted  Name::     Dahlia Byes  Relationship::  Sister and [place]  Contact Information:  [phone]  Housing/Transportation Living arrangements for the past 2 months:  [place] of Information:  Power of Attorney Patient Interpreter Needed:  None Criminal Activity/Legal Involvement Pertinent to Current Situation/Hospitalization:  No - Comment as needed Significant Relationships:  Other Family Members, Siblings, Spouse Lives with:  Significant Other Do you feel safe going back to the place where you live?  No (Pt is in need of a highter level of care) Need for family participation in patient care:  Yes (Comment)  Care giving concerns:  Pt is in need of a higher level of care.   Social Worker assessment / plan:  CSW spoke with pt's sister, [name], who is also her POA. CSW introduced herself and explained role of social work. CSW also explained process of discharging to SNF for STR. Pt lives at home with her husband and has caregivers in the home 4 hours a day. Pt's husband has dementia. Pt's sister has secured care at [facility]. CSW encouraged sister to follow up with facility when pt completes STR.   CSW also met with pt and family at bedside. Pt and spouse were pleasant, and agreeable to STR. CSW left the list of facilities for placement at bedside. CSW initiated SNF search and will follow up with bed offers. [employer] Josem Kaufmann will be needed for auth. MD is aware. CSW will continue to follow.   Employment status:  Retired Nurse, adult PT Recommendations:  [place] / Referral to community resources:  [place]  Patient/Family's Response to care:  Pt's sister was Patent attorney of CSW support.   Patient/Family's Understanding of and Emotional Response to Diagnosis, Current Treatment, and Prognosis:  Pt's sister understands that pt would benefit from STR prior going to a memory care unit.  Emotional Assessment Appearance:  Other (Comment Required Attitude/Demeanor/Rapport:  Other Affect (typically observed):   Appropriate Orientation:  Oriented to Self Alcohol / Substance use:  Not Applicable Psych involvement (Current and /or in the community):  No (Comment)  Discharge Needs  Concerns to be addressed:  Adjustment to Illness Readmission within the last 30 days:  No Current discharge risk:  Chronically ill Barriers to Discharge:  Continued Medical Work up   [employer], LCSW 07/08/2017, 10:53 AM

## 2017-07-08 NOTE — Evaluation (Signed)
Physical Therapy Evaluation Patient Details Name: Tiffany Arnold MRN: [id number] DOB: 1941/09/14 Today's Date: 07/08/2017   History of Present Illness  presented to ER secondary; admitted with acute encephalopathy secondary to UTI.  Clinical Impression  Upon evaluation, patient alert and oriented to self only.  Follows simple, one-step commands, but requires step by step cuing for task completion throughout session.  Currently requiring sup assist for bed mobility; min assist for sit/stand, basic transfers and short-distance gait (10') with RW.  Poor standing balance, requiring constant hands-on assist to prevent posterior LOB with all standing activities.  Patient generally unaware of balance deficits and need for assist or assist device; very high risk for falls.  Do recommend RW and +1 at all times time due to fall risk. Would benefit from skilled PT to address above deficits and promote optimal return to PLOF; recommend transition to STR upon discharge from acute hospitalization.     Follow Up Recommendations SNF    Equipment Recommendations       Recommendations for Other Services       Precautions / Restrictions Precautions Precautions: Fall Restrictions Weight Bearing Restrictions: No      Mobility  Bed Mobility Overal bed mobility: Needs Assistance Bed Mobility: Supine to Sit     Supine to sit: Supervision     General bed mobility comments: cuing for task initiation and sequencing  Transfers Overall transfer level: Needs assistance Equipment used: Rolling walker (2 wheeled) Transfers: Sit to/from Stand Sit to Stand: Min assist         General transfer comment: multiple attempts to complete; poor balance with movement transition; min assist to complete lift off and prevent posterior LOB  Ambulation/Gait Ambulation/Gait assistance: Min assist Ambulation Distance (Feet): 10 Feet Assistive device: Rolling walker (2 wheeled)       General Gait Details:  broad BOS with staggering gait performance; poor balance; increased sway in A/P plane requriing min assist from therapist to prevent posteriro LOB throughout gait trial.  Step by step cuing for task recall and completion (highly distractible by external environment)  Stairs            Wheelchair Mobility    Modified Rankin (Stroke Patients Only)       Balance Overall balance assessment: Needs assistance Sitting-balance support: No upper extremity supported;Feet supported Sitting balance-Leahy Scale: Good     Standing balance support: Bilateral upper extremity supported Standing balance-Leahy Scale: Poor                               Pertinent Vitals/Pain Pain Assessment: No/denies pain    Home Living                   Additional Comments: Patient unable to accurately report; family not available.  Per previous documentation, lives with husband in two-story home with ramp, bed/bathroom on main level of home.  Some discussion noted of transition to ALF/memory care; unsure if this has happened yet or not.  Will verify with family as available.    Prior Function Level of Independence: Needs assistance         Comments: Per chart, ambulatory with 4WR[facility].     Hand Dominance        Extremity/Trunk Assessment   Upper Extremity Assessment Upper Extremity Assessment:  (bilat shoulder elevation limited (chronic arthritic changes, L > R); otherwise, grossly WFL)    Lower Extremity Assessment Lower Extremity Assessment: Overall [facility]  for tasks assessed (grossly at least 4/5 throughout)       Communication   Communication: No difficulties  Cognition Arousal/Alertness: Awake/alert Behavior During Therapy: Restless (fidgeting, reaching for items, attempting to close walker during use) Overall Cognitive Status: Difficult to assess                                 General Comments: oriented to self only; follows simple commands, but  demonstrates poor recall or new information, poor safety awareness/insight      General Comments      Exercises Other Exercises Other Exercises: Toilet transfer, SPT with RW, min assist; step by step cuing for task sequencing.  Incontinent BM mid-transfer; dep for hygiene.  Sit/stand from [facility] with RW, min assist; static stance during hygiene, clothing management, min/mod assist.  Patient very motor restless, fidgeting with buttons, gown throughout task.   Assessment/Plan    PT Assessment Patient needs continued PT services  PT Problem List Decreased activity tolerance;Decreased balance;Decreased mobility;Decreased coordination;Decreased cognition;Decreased knowledge of use of DME;Decreased safety awareness;Decreased knowledge of precautions       PT Treatment Interventions DME instruction;Gait training;Functional mobility training;Therapeutic exercise;Therapeutic activities;Balance training;Patient/family education    PT Goals (Current goals can be found in the Care Plan section)  Acute Rehab PT Goals Patient Stated Goal: to go to the bathroom PT Goal Formulation: With patient Time For Goal Achievement: 07/22/17 Potential to Achieve Goals: Fair    Frequency Min 2X/week   Barriers to discharge Decreased caregiver support      Co-evaluation               AM-PAC PT "6 Clicks" Daily Activity  Outcome Measure Difficulty turning over in bed (including adjusting bedclothes, sheets and blankets)?: A Little Difficulty moving from lying on back to sitting on the side of the bed? : A Little Difficulty sitting down on and standing up from a chair with arms (e.g., wheelchair, bedside commode, etc,.)?: Total Help needed moving to and from a bed to chair (including a wheelchair)?: A Little Help needed walking in hospital room?: A Little Help needed climbing 3-5 steps with a railing? : A Lot 6 Click Score: 15    End of Session Equipment Utilized During Treatment: Gait  belt Activity Tolerance: Patient tolerated treatment well Patient left: in chair;with call bell/phone within reach;with chair alarm set;with nursing/sitter in room Nurse Communication: Mobility status PT Visit Diagnosis: Difficulty in walking, not elsewhere classified (R26.2)    Time: 4196-2229 PT Time Calculation (min) (ACUTE ONLY): 44 min   Charges:   PT Evaluation $PT Eval Low Complexity: 1 Procedure PT Treatments $Therapeutic Activity: 23-37 mins   PT G Codes:        Nima Bamburg H. Owens Shark, PT, DPT, NCS 07/08/17, 10:11 AM [phone]

## 2017-07-08 NOTE — Plan of Care (Signed)
Problem: Education: Goal: Knowledge of [facility] Education information/materials will improve Outcome: Not Progressing Pt alert to self. Follow commands. Calm and cooperative.  Problem: Physical Regulation: Goal: Ability to maintain clinical measurements within normal limits will improve Outcome: Progressing S/p lumbar puncture. Pt denies pain. Luncture puncture site WDL, bandaid in place.

## 2017-07-08 NOTE — Clinical Social Work Placement (Deleted)
   CLINICAL SOCIAL WORK PLACEMENT  NOTE  Date:  07/08/2017  Patient Details  Name: Tiffany Arnold MRN: [id number] Date of Birth: Feb 25, 1941  Clinical Social Work is seeking post-discharge placement for this patient at the [place] level of care (*CSW will initial, date and re-position this form in  chart as items are completed):  Yes   Patient/family provided with [place] Work Department's list of facilities offering this level of care within the geographic area requested by the patient (or if unable, by the patient's family).  Yes   Patient/family informed of their freedom to choose among providers that offer the needed level of care, that participate in Medicare, Medicaid or managed care program needed by the patient, have an available bed and are willing to accept the patient.  Yes   Patient/family informed of [facility]'s ownership interest in [facility] and [facility], as well as of the fact that they are under no obligation to receive care at these facilities.  PASRR submitted to EDS on 07/08/17     PASRR number received on       Existing PASRR number confirmed on       FL2 transmitted to all facilities in geographic area requested by pt/family on 07/08/17     FL2 transmitted to all facilities within larger geographic area on       Patient informed that his/her managed care company has contracts with or will negotiate with certain facilities, including the following:            Patient/family informed of bed offers received.  Patient chooses bed at       Physician recommends and patient chooses bed at      Patient to be transferred to   on  .  Patient to be transferred to facility by       Patient family notified on   of transfer.  Name of family member notified:        PHYSICIAN       Additional Comment:    _______________________________________________ Darden Dates, LCSW 07/08/2017, 11:08 AM

## 2017-07-09 DIAGNOSIS — R4701 Aphasia: Secondary | ICD-10-CM | POA: Diagnosis not present

## 2017-07-09 DIAGNOSIS — M85862 Other specified disorders of bone density and structure, left lower leg: Secondary | ICD-10-CM | POA: Diagnosis not present

## 2017-07-09 DIAGNOSIS — Z7401 Bed confinement status: Secondary | ICD-10-CM | POA: Diagnosis not present

## 2017-07-09 DIAGNOSIS — N390 Urinary tract infection, site not specified: Secondary | ICD-10-CM | POA: Diagnosis not present

## 2017-07-09 DIAGNOSIS — Z66 Do not resuscitate: Secondary | ICD-10-CM | POA: Diagnosis not present

## 2017-07-09 DIAGNOSIS — R2689 Other abnormalities of gait and mobility: Secondary | ICD-10-CM | POA: Diagnosis not present

## 2017-07-09 DIAGNOSIS — R278 Other lack of coordination: Secondary | ICD-10-CM | POA: Diagnosis not present

## 2017-07-09 DIAGNOSIS — G4733 Obstructive sleep apnea (adult) (pediatric): Secondary | ICD-10-CM | POA: Diagnosis not present

## 2017-07-09 DIAGNOSIS — E785 Hyperlipidemia, unspecified: Secondary | ICD-10-CM | POA: Diagnosis not present

## 2017-07-09 DIAGNOSIS — R41841 Cognitive communication deficit: Secondary | ICD-10-CM | POA: Diagnosis not present

## 2017-07-09 DIAGNOSIS — I890 Lymphedema, not elsewhere classified: Secondary | ICD-10-CM | POA: Diagnosis not present

## 2017-07-09 DIAGNOSIS — C50311 Malignant neoplasm of lower-inner quadrant of right female breast: Secondary | ICD-10-CM | POA: Diagnosis not present

## 2017-07-09 DIAGNOSIS — B9620 Unspecified Escherichia coli [E. coli] as the cause of diseases classified elsewhere: Secondary | ICD-10-CM | POA: Diagnosis not present

## 2017-07-09 DIAGNOSIS — R6889 Other general symptoms and signs: Secondary | ICD-10-CM | POA: Diagnosis not present

## 2017-07-09 DIAGNOSIS — G9340 Encephalopathy, unspecified: Secondary | ICD-10-CM | POA: Diagnosis not present

## 2017-07-09 DIAGNOSIS — C773 Secondary and unspecified malignant neoplasm of axilla and upper limb lymph nodes: Secondary | ICD-10-CM | POA: Diagnosis not present

## 2017-07-09 DIAGNOSIS — Z79899 Other long term (current) drug therapy: Secondary | ICD-10-CM | POA: Diagnosis not present

## 2017-07-09 DIAGNOSIS — G9341 Metabolic encephalopathy: Secondary | ICD-10-CM | POA: Diagnosis not present

## 2017-07-09 DIAGNOSIS — Z170 Estrogen receptor positive status [ER+]: Secondary | ICD-10-CM | POA: Diagnosis not present

## 2017-07-09 DIAGNOSIS — Z79811 Long term (current) use of aromatase inhibitors: Secondary | ICD-10-CM | POA: Diagnosis not present

## 2017-07-09 DIAGNOSIS — M85861 Other specified disorders of bone density and structure, right lower leg: Secondary | ICD-10-CM | POA: Diagnosis not present

## 2017-07-09 DIAGNOSIS — J45909 Unspecified asthma, uncomplicated: Secondary | ICD-10-CM | POA: Diagnosis not present

## 2017-07-09 DIAGNOSIS — K219 Gastro-esophageal reflux disease without esophagitis: Secondary | ICD-10-CM | POA: Diagnosis not present

## 2017-07-09 DIAGNOSIS — R2681 Unsteadiness on feet: Secondary | ICD-10-CM | POA: Diagnosis not present

## 2017-07-09 DIAGNOSIS — Z87891 Personal history of nicotine dependence: Secondary | ICD-10-CM | POA: Diagnosis not present

## 2017-07-09 DIAGNOSIS — M6281 Muscle weakness (generalized): Secondary | ICD-10-CM | POA: Diagnosis not present

## 2017-07-09 DIAGNOSIS — R739 Hyperglycemia, unspecified: Secondary | ICD-10-CM | POA: Diagnosis not present

## 2017-07-09 DIAGNOSIS — R4182 Altered mental status, unspecified: Secondary | ICD-10-CM | POA: Diagnosis not present

## 2017-07-09 DIAGNOSIS — R413 Other amnesia: Secondary | ICD-10-CM | POA: Diagnosis not present

## 2017-07-09 DIAGNOSIS — Z96653 Presence of artificial knee joint, bilateral: Secondary | ICD-10-CM | POA: Diagnosis not present

## 2017-07-09 DIAGNOSIS — I10 Essential (primary) hypertension: Secondary | ICD-10-CM | POA: Diagnosis not present

## 2017-07-09 LAB — BASIC METABOLIC PANEL WITH GFR
Anion gap: 9 (ref 5–15)
BUN: 25 mg/dL — ABNORMAL HIGH (ref 6–20)
CO2: 29 mmol/L (ref 22–32)
Calcium: 10.3 mg/dL (ref 8.9–10.3)
Chloride: 101 mmol/L (ref 101–111)
Creatinine, Ser: 0.61 mg/dL (ref 0.44–1.00)
GFR calc Af Amer: >60 mL/min
GFR calc non Af Amer: >60 mL/min
Glucose, Bld: 117 mg/dL — ABNORMAL HIGH (ref 65–99)
Potassium: 3.4 mmol/L — ABNORMAL LOW (ref 3.5–5.1)
Sodium: 139 mmol/L (ref 135–145)

## 2017-07-09 LAB — URINE CULTURE

## 2017-07-09 MED ORDER — ENSURE ENLIVE PO LIQD: 237.0000 mL | Freq: Three times a day (TID) | ORAL | 12 refills | Status: DC

## 2017-07-09 MED ORDER — POTASSIUM CHLORIDE CRYS ER 20 MEQ PO TBCR
20.0000 meq | EXTENDED_RELEASE_TABLET | Freq: Once | ORAL | Status: AC
  Administered 2017-07-09: 12:00:00 20 meq via ORAL
  Filled 2017-07-09: qty 1

## 2017-07-09 MED ORDER — CEPHALEXIN 500 MG PO CAPS: 500.0000 mg | ORAL_CAPSULE | Freq: Two times a day (BID) | ORAL | 0 refills | Status: DC

## 2017-07-09 NOTE — Discharge Instructions (Signed)
Antibiotic Medicine, Adult Antibiotic medicines are used to treat infections caused by bacteria, such as strep throat and urinary tract infection (UTI). Antibiotic medicines will not work for viral illnesses, such as colds or the flu (influenza). They work by killing the bacteria that is making you sick. Antibiotics can also have serious side effects. It is important that you take antibiotic medicines safely and only when needed. When do I need to take antibiotics? Antibiotics are medicines that treat bacterial infections. You may need antibiotics for:  UTI.  Strep throat.  Meningitis. This infection affects the spinal cord and brain.  Bacterial sinusitis.  Serious lung infection.  You may start antibiotics while your health care provider waits for test results to come back. Common tests may include throat, urine, blood, or mucus culture. Your health care provider may change or stop the antibiotic depending on your test results. When are antibiotics not needed? You do not need antibiotics for most common illnesses. These illnesses may be caused by a virus, not a bacteria. You do not need antibiotics for:  The common cold.  Influenza.  Sore throat.  Discolored mucus.  Bronchitis.  Antibiotics are not always needed for all bacterial infections. Many of these infections clear up without antibiotic treatment. Do not ask for or take antibiotics when they are not necessary. How long should I take the antibiotic? You must take the entire prescription. Continue to take your antibiotic for as long as told by your health care provider. Do not stop taking it even if you start to feel better. If you stop taking it too soon:  You may start to feel sick again.  Your infection may become harder to treat.  Complications may develop.  Each course of antibiotics needs a different amount of time to work. Some antibiotic courses last only a few days. Some last about a week to 10 days. In some  cases, you may need to take antibiotics for a few weeks to completely treat the infection. What if I miss a dose? Try not to miss any doses of medicine. If you miss a dose, call your health care provider or pharmacist for advice. Sometimes it is okay to take the missed dose as soon as possible. What are the risks of taking antibiotics? Most antibiotics can cause an infection called Clostridium difficile (C. difficile), which causes severe diarrhea. This infection happens when the antibiotics kill the healthy bacteria in your intestines. This allows C. difficile to grow. The infection needs to be treated right away. Let your health care provider know if:  You have diarrhea while taking an antibiotic.  You have diarrhea after you stop taking an antibiotic. C. difficile infection can start weeks after stopping the antibiotic.  Taking an antibiotic also puts you at risk for getting a bacteria that does not respond to medicine (antibiotic-resistant infection) in the future. Antibiotics can cause bacteria to change so that if the antibiotic is taken again, the medicine is not able to kill the bacteria. These infections can be more serious and, in some cases, life-threatening. Do antibiotics affect birth control? Birth control pills may not work while you are on antibiotics. If you are taking birth control pills, continue taking them as usual and use a second form of birth control, such as a condom, to avoid unwanted pregnancy. Continue using the second form of birth control until your health care provider says you can stop. What else should I know about taking antibiotics? It is important for you to  take antibiotics exactly as told. Make sure that you:  Take the entire course of antibiotic that was prescribed. Do not stop taking your antibiotics even if your symptoms improve.  Take the correct amount of medicine each day.  Ask your health care provider: ? How long to wait in between doses. ? If the  antibiotic should be taken with food. ? If there are any foods, drinks, or medicines that you should avoid while taking the antibiotics. ? If there are any side effects you should be aware of.  Only use the antibiotics prescribed for you by your health care provider. Do not use antibiotics prescribed for someone else.  Drink a large glass of water along with the antibiotics.  Ask the pharmacist for a syringe, cup, or spoon that properly measures the antibiotics.  Throw away any leftover medicine.  Contact a health care provider if:  Your symptoms get worse.  You have new joint pain or muscle aches that begin after starting the antibiotic. When should I seek immediate medical care? Seek immediate medical care if:  You have signs of a serious allergic reaction to antibiotics. If you have signs of a severe allergic reaction, stop taking the antibiotic right away. Signs may include: ? Hives, which are raised, itchy, red bumps on the skin. ? Skin rash. ? Trouble breathing. ? A wheezing sound when you breathe. ? Swelling anywhere on your body. ? Feeling dizzy. ? Vomiting.  Your urine turns dark or becomes blood-colored.  Your skin turns yellow.  You bruise or bleed easily.  You have severe diarrhea and abdominal cramps.  You have a severe headache.  Summary  Antibiotic medicines are used to treat infections caused by bacteria, such as strep throat and UTIs. It is important that you take antibiotic medicines only when needed.  Your health care provider may change or stop the antibiotic depending on your test results.  Most antibiotics can cause an infection called Clostridium difficile (C. difficile), which causes severe diarrhea. Let your health care provider know if you develop diarrhea while taking an antibiotic.  Take the entire course of antibiotic that was prescribed. This information is not intended to replace advice given to you by your health care provider. Make sure  you discuss any questions you have with your health care provider. Document Released: 08/20/2004 Document Revised: 12/09/2016 Document Reviewed: 12/09/2016 Elsevier Interactive Patient Education  [employer].

## 2017-07-09 NOTE — Progress Notes (Signed)
Pt for discharge to [facility] in [place]. Pt to go by ems.  No distress.  Pt s sister  poa and discharge instructions discussed with pts sister.   Diet / meds / activity and f/u discussed/ verbalizes understanding. Report called and ems called to transport.

## 2017-07-09 NOTE — Care Management Important Message (Signed)
Important Message  Patient Details  Name: ANAVI BRANSCUM MRN: [id number] Date of Birth: 12/04/1941   Medicare Important Message Given:  Yes    Shelbie Ammons, RN 07/09/2017, 8:15 AM

## 2017-07-09 NOTE — Progress Notes (Signed)
Physical Therapy Treatment Patient Details Name: Tiffany Arnold MRN: [id number] DOB: [birth year] Today's Date: 07/09/2017    History of Present Illness presented to ER secondary; admitted with acute encephalopathy secondary to UTI.    PT Comments    Pt asleep upon arrival.  Challenging to awaken pt.  She was conversant but kept eyes closed.  Exercises as described below in attempts to awaken pt.  She did say she was hungry and wanted to get up to eat.   Husband arrived and stated she was not a morning person.  To edge of bed with mod a x 1.  Sitting with min guard for safety due to continued lethargy.  Attempted standing with walker but she had difficulty and put little effort into standing attempts.  Removed walker for stand pivot.  Attempted x 2 but she would initiate with mod assist then sit back down before fully standing.  She complained of general back soreness and stated "Let me lay back down"  Returned to supine for general pt and staff safety.  Pt may do better with later in the day sessions per husband.     Follow Up Recommendations  SNF     Equipment Recommendations       Recommendations for Other Services       Precautions / Restrictions Precautions Precautions: Fall Restrictions Weight Bearing Restrictions: No    Mobility  Bed Mobility Overal bed mobility: Needs Assistance Bed Mobility: Supine to Sit     Supine to sit: Mod assist     General bed mobility comments: needed increased assist this am for initiating and completing task  Transfers Overall transfer level: Needs assistance Equipment used: Rolling walker (2 wheeled) Transfers: Sit to/from Stand Sit to Stand: Mod assist;Max assist         General transfer comment: Pt unable to stand fully today with or without walker   Ambulation/Gait             General Gait Details: deferred for safety   Stairs            Wheelchair Mobility    Modified Rankin (Stroke Patients Only)        Balance Overall balance assessment: Needs assistance Sitting-balance support: No upper extremity supported;Feet supported Sitting balance-Leahy Scale: Fair Sitting balance - Comments: requried +1 min guard        Standing balance comment: uanble to stand this am                            Cognition Arousal/Alertness: Lethargic Behavior During Therapy: WFL for tasks assessed/performed Overall Cognitive Status: Difficult to assess                                 General Comments: oriented to self only; follows simple commands, but demonstrates poor recall or new information, poor safety awareness/insight      Exercises Other Exercises Other Exercises: Supine ankle pumps, heel slides, SLR, ab/adduction x 10 AAROM to attemt to awaken pt.    General Comments        Pertinent Vitals/Pain Pain Assessment: Faces Faces Pain Scale: Hurts even more Pain Location: back upon attempt to stand this am Pain Descriptors / Indicators: Aching;Sore Pain Intervention(s): Limited activity within patient's tolerance;Monitored during session    Home Living  Prior Function            PT Goals (current goals can now be found in the care plan section)      Frequency    Min 2X/week      PT Plan Current plan remains appropriate    Co-evaluation              AM-PAC PT "6 Clicks" Daily Activity  Outcome Measure  Difficulty turning over in bed (including adjusting bedclothes, sheets and blankets)?: Total Difficulty moving from lying on back to sitting on the side of the bed? : Total Difficulty sitting down on and standing up from a chair with arms (e.g., wheelchair, bedside commode, etc,.)?: Total Help needed moving to and from a bed to chair (including a wheelchair)?: Total Help needed walking in hospital room?: Total Help needed climbing 3-5 steps with a railing? : Total 6 Click Score: 6    End of Session  Equipment Utilized During Treatment: Gait belt Activity Tolerance: Patient limited by lethargy Patient left: in bed;with bed alarm set;with call bell/phone within reach;with family/visitor present         Time: 2518-9842 PT Time Calculation (min) (ACUTE ONLY): 17 min  Charges:  $Therapeutic Exercise: 8-22 mins                    G Codes:     Chesley Noon, PTA 07-25-17, 9:33 AM

## 2017-07-09 NOTE — Clinical Social Work Placement (Signed)
   CLINICAL SOCIAL WORK PLACEMENT  NOTE  Date:  07/09/2017  Patient Details  Name: Tiffany Arnold MRN: [id number] Date of Birth: 11/29/1941  Clinical Social Work is seeking post-discharge placement for this patient at the [place] level of care (*CSW will initial, date and re-position this form in  chart as items are completed):  Yes   Patient/family provided with [facility] Work Department's list of facilities offering this level of care within the geographic area requested by the patient (or if unable, by the patient's family).  Yes   Patient/family informed of their freedom to choose among providers that offer the needed level of care, that participate in Medicare, Medicaid or managed care program needed by the patient, have an available bed and are willing to accept the patient.  Yes   Patient/family informed of [facility]'s ownership interest in [facility] and [facility], as well as of the fact that they are under no obligation to receive care at these facilities.  PASRR submitted to EDS on 07/08/17     PASRR number received on 07/08/17     Existing PASRR number confirmed on       FL2 transmitted to all facilities in geographic area requested by pt/family on 07/08/17     FL2 transmitted to all facilities within larger geographic area on       Patient informed that his/her managed care company has contracts with or will negotiate with certain facilities, including the following:        Yes   Patient/family informed of bed offers received.  Patient chooses bed at [facility]     Physician recommends and patient chooses bed at      Patient to be transferred to [facility] on 07/09/17.  Patient to be transferred to facility by [facility]     Patient family notified on 07/09/17 of transfer.  Name of family member notified:  Pt's sister, [name]     PHYSICIAN       Additional Comment:     _______________________________________________ Darden Dates, LCSW 07/09/2017, 4:07 PM

## 2017-07-09 NOTE — Progress Notes (Signed)
Pt left at this time to [facility] via em[facility].  No distress. Report called to taffy rimirez rn.

## 2017-07-09 NOTE — Discharge Summary (Signed)
[name] NAME: Tiffany Arnold    MR#:  [id number]  DATE OF BIRTH:  [birth year]  DATE OF ADMISSION:  07/06/2017 ADMITTING PHYSICIAN: Baxter Hire, MD  DATE OF DISCHARGE: 07/09/2017  PRIMARY CARE PHYSICIAN: [name], NP    ADMISSION DIAGNOSIS:  Lower urinary tract infectious disease [N39.0] Confusion [R41.0]  DISCHARGE DIAGNOSIS:  Acute EncephalopathyPatient appears at baseline.  Escherichia coli UTI  Generalized weakness SECONDARY DIAGNOSIS:   Past Medical History:  Diagnosis Date  . Allergy   . Aphasia 06/22/2017  . Arthritis   . Asthma   . Cancer ([place]) 07/29/2016   right breast  . Cataract of both eyes   . Chicken pox   . Colon polyps   . Dog bite of index finger 07/23/2016   Left index finger  . GERD (gastroesophageal reflux disease)   . History of radiation therapy 12/24/16- 02/03/17   Right Breast 50.4 Gy in 28 fractions, Right Axilla 45 Gy in 25 fractions.  . Hyperlipidemia   . Hypertension   . IBS (irritable bowel syndrome)   . Memory disorder 06/22/2017  . Phlebitis   . Pneumonia    hx of several yrs ago  . Shortness of breath dyspnea    with exertion only  . Sleep apnea    wears CPAP machine nightly    HOSPITAL COURSE:  Tiffany Arnold a 76 y.o.femalewith a past medical history of gastric reflux, arthritis, hypertension, hyperlipidemia, confusion/memory deficits, presents the emergency department for altered mental status. Family states since May  the patient has had mild continued confusion. 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 She has worsening mental status likely due to the infection.  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  -Patient's neurologist Dr. Margette Fast  requested a lumbar puncture be done to rule out carcinomatosis. Patient underwent lumbar puncture on July 18 and CSF was sent for cytology which will be followed by Dr. [name].  2. Urinary tract infection.  -Received IV Rocephin we'll change to oral keflex.  -Patient is afebrile. -UC Ecoli  3. Hypertension.  -ovrall bp ok..the patient not on any meds  4. Hyperlipidemia. We'll continue current medications  5. PTrecommends rehabilitation.   Social worker for discharge planning.  Patient's sister Tiffany Arnold informed about discharge planning and she is agreeable with the patient going to rehabilitation. Awaiting insurance authorization. Patient is medically stable for discharge.  CONSULTS OBTAINED:    DRUG ALLERGIES:   Allergies  Allergen Reactions  . Other Swelling and Shortness Of Breath    Cats, dogs, mold Induced asthma Cats, dogs, mold    DISCHARGE MEDICATIONS:   Current Discharge Medication List    START taking these medications   Details  cephALEXin (KEFLEX) 500 MG capsule Take 1 capsule (500 mg total) by mouth 2 (two) times daily. Qty: 8 capsule, Refills: 0    feeding supplement, ENSURE ENLIVE, (ENSURE ENLIVE) LIQD Take 237 mLs by mouth 3 (three) times daily between meals. Qty: 237 mL, Refills: 12      CONTINUE these medications which have NOT CHANGED   Details  ALPRAZolam (XANAX) 0.25 MG tablet TAKE ONE TABLET BY MOUTH TWICE DAILY AS NEEDED Qty: 60 tablet, Refills: 0    Calcium Carbonate-Vitamin D (CALCIUM-VITAMIN D) 500-200 MG-UNIT tablet Take 1  tablet by mouth daily.    cetirizine (ZYRTEC) 10 MG tablet Take 10 mg by mouth daily.    Cholecalciferol (VITAMIN D3) 1000 units CAPS Take by mouth 2 (two) times daily.    diphenoxylate-atropine (LOMOTIL) 2.5-0.025 MG tablet Take 2 tablets by mouth 4 (four) times daily as needed for diarrhea or loose stools. 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    letrozole (FEMARA) 2.5 MG tablet Take 1 tablet (2.5 mg total) by mouth daily. Qty: 30 tablet, Refills: 5    naproxen sodium (ANAPROX) 220 MG tablet Take 220 mg by mouth 2 (two) times daily with a meal.    sertraline (ZOLOFT) 25 MG tablet TAKE ONE (1) TABLET EACH DAY Qty: 90 tablet, Refills: 0   Associated Diagnoses: Generalized anxiety disorder    traMADol (ULTRAM) 50 MG tablet Take 1 tablet (50 mg total) by mouth every 8 (eight) hours as needed. Qty: 90 tablet, Refills: 0    vitamin B-12 (CYANOCOBALAMIN) 1000 MCG tablet Take 1,000 mcg by mouth daily.    albuterol-ipratropium (COMBIVENT) 18-103 MCG/ACT inhaler Inhale 2 puffs into the lungs every 4 (four) hours as needed for wheezing or shortness of breath.     EPINEPHrine 0.3 mg/0.3 mL IJ SOAJ injection Inject 0.3 mg into the muscle once as needed (ALLERGIC REACTION).         If you experience worsening of your admission symptoms, develop shortness of breath, life threatening emergency, suicidal or homicidal thoughts you must seek medical attention immediately by calling 911 or calling your MD immediately  if symptoms less severe.  You Must read complete instructions/literature along with all the possible adverse reactions/side effects for all the Medicines you take and that have been prescribed to you. Take any new Medicines after you have completely understood and accept all the possible adverse reactions/side effects.   Please note  You were cared for by a hospitalist during your hospital stay. If you have any questions about your discharge medications or the care you received while you were in the hospital after you are discharged, you can call the unit and asked to speak with the hospitalist on call if the hospitalist that took care of you is not  available. Once you are discharged, your primary care physician will handle any further medical issues. Please note that NO REFILLS for any discharge medications will be authorized once you are discharged, as it is imperative that you return to your primary care physician (or establish a relationship with a primary care physician if you do not have one) for your aftercare needs so that they can reassess your need for medications and monitor your lab values. Today   SUBJECTIVE   Trying to work with physical therapy. Appears to be alert and awake.   VITAL SIGNS:  Blood pressure (!) 163/83, pulse 81, temperature 98.5 F (36.9 C), temperature source Oral, resp. rate 18, height 5\' 3"  (1.6 m), weight 76.3 kg (168 lb 4.8 oz), SpO2 100 %.  I/O:  No intake or output data in the 24 hours ending 07/09/17 0911  PHYSICAL EXAMINATION:  GENERAL:  76 y.o.-year-old patient lying in the bed with no acute distress.  EYES: Pupils equal, round, reactive to light and accommodation. No scleral icterus. Extraocular muscles intact.  HEENT: Head atraumatic, normocephalic. Oropharynx  and nasopharynx clear.  NECK:  Supple, no jugular venous distention. No thyroid enlargement, no tenderness.  LUNGS: Normal breath sounds bilaterally, no wheezing, rales,rhonchi or crepitation. No use of accessory muscles of respiration.  CARDIOVASCULAR: S1, S2 normal. No murmurs, rubs, or gallops.  ABDOMEN: Soft, non-tender, non-distended. Bowel sounds present. No organomegaly or mass.  EXTREMITIES: No pedal edema, cyanosis, or clubbing.  NEUROLOGIC: Cranial nerves II through XII are intact. Muscle strength 5/5 in all extremities. Sensation intact. Gait not checked. Generalized weakness  PSYCHIATRIC: The patient is alert and oriented x 2.  SKIN: No obvious rash, lesion, or ulcer.   DATA REVIEW:   CBC   Recent Labs Lab 07/07/17 0419  WBC 9.4  HGB 8.8*  HCT 27.4*  PLT 419    Chemistries   Recent Labs Lab 07/06/17 1318  07/09/17 0427  NA 138 139  K 4.5 3.4*  CL 100* 101  CO2 28 29  GLUCOSE 141* 117*  BUN 25* 25*  CREATININE 0.65 0.61  CALCIUM 10.4* 10.3  AST 27  --   ALT 12*  --   ALKPHOS 83  --   BILITOT 1.0  --     Microbiology Results   Recent Results (from the past 240 hour(s))  Urine culture     Status: Abnormal   Collection Time: 07/06/17  2:59 PM  Result Value Ref Range Status   Specimen Description URINE, RANDOM  Final   Special Requests NONE  Final   Culture >=100,000 COLONIES/mL ESCHERICHIA COLI (A)  Final   Report Status 07/09/2017 FINAL  Final   Organism ID, Bacteria ESCHERICHIA COLI (A)  Final      Susceptibility   Escherichia coli - MIC*    AMPICILLIN >=32 RESISTANT Resistant     CEFAZOLIN <=4 SENSITIVE Sensitive     CEFTRIAXONE <=1 SENSITIVE Sensitive     CIPROFLOXACIN <=0.25 SENSITIVE Sensitive     GENTAMICIN >=16 RESISTANT Resistant     IMIPENEM <=0.25 SENSITIVE Sensitive     NITROFURANTOIN <=16 SENSITIVE Sensitive     TRIMETH/SULFA >=320 RESISTANT Resistant     AMPICILLIN/SULBACTAM >=32 RESISTANT Resistant     PIP/TAZO <=4 SENSITIVE Sensitive     Extended ESBL NEGATIVE Sensitive     * >=100,000 COLONIES/mL ESCHERICHIA COLI    RADIOLOGY:  Dg Fluoro Guided Loc Of Needle/cath Tip For Spinal Inject Lt  Result Date: 07/08/2017 CLINICAL DATA:  Confused, acute on chronic encephalopathy. EXAM: DIAGNOSTIC LUMBAR PUNCTURE UNDER FLUOROSCOPIC GUIDANCE FLUOROSCOPY TIME:  Fluoroscopy Time:  0.3 minutes PROCEDURE: Informed consent was obtained from the patient prior to the procedure, including potential complications of headache, allergy, and pain. With the patient prone, the lower back was prepped with Betadine. 1% Lidocaine was used for local anesthesia. Lumbar puncture was performed at the L4-5 level using a 22 gauge needle with return of clear CSF. 9 ml of CSF were obtained for laboratory studies. The patient tolerated the procedure well and there were no apparent complications.  IMPRESSION: Successful fluoroscopic guided lumbar puncture. Electronically Signed   By: Kathreen Devoid   On: 07/08/2017 16:49     Management plans discussed with the patient, family and they are in agreement.  CODE STATUS:     Code Status Orders        Start     Ordered   07/06/17 1701  Do not attempt resuscitation (DNR)  Continuous    Question Answer Comment  In the event of cardiac or respiratory ARREST Do not call a "code blue"  In the event of cardiac or respiratory ARREST Do not perform Intubation, CPR, defibrillation or ACLS   In the event of cardiac or respiratory ARREST Use medication by any route, position, wound care, and other measures to relive pain and suffering. May use oxygen, suction and manual treatment of airway obstruction as needed for comfort.      07/06/17 1700    Code Status History    Date Active Date Inactive Code Status Order ID Comments User Context   04/27/2017  5:45 PM 04/30/2017  5:14 PM Full Code [id number]  Caren Griffins, MD Inpatient   07/29/2016  7:49 PM 07/30/2016  2:33 PM Full Code [id number]  Fanny Skates, MD Inpatient    Advance Directive Documentation     Most Recent Value  Type of Advance Directive  Healthcare Power of Attorney  Pre-existing out of facility DNR order (yellow form or pink MOST form)  -  "MOST" Form in Place?  -      TOTAL TIME TAKING CARE OF THIS PATIENT: 40 minutes.    Adeeb Konecny M.D on 07/09/2017 at 9:11 AM  Between 7am to 6pm - Pager - [phone] After 6pm go to [email or web address] - password [email or web address] [facility]  Office  [phone]  CC: Primary care physician; [name], NP

## 2017-07-09 NOTE — Telephone Encounter (Signed)
Patients sister [name] (listed on DPR) called office to advise Dr. [name] patient had lumbar puncture yesterday at [facility] around 4:00pm.  FYI

## 2017-07-09 NOTE — Clinical Social Work Note (Signed)
Pt is ready for discharge today to [facility]. [employer] Josem Kaufmann ahs been obtained. Facility is ready to admit pt as they have received discharge information. Pt and sister are aware and agreeable to discharge plan. RN will call report. [facility] will provide transportation. CSW is signing off as no further needs identified.   Darden Dates, MSW, LCSW Clinical Social Worker  [phone]

## 2017-07-09 NOTE — Telephone Encounter (Signed)
The patient has had a lumbar puncture, cytology is pending, spinal fluid is unremarkable with exception of an elevated protein level.

## 2017-07-10 DIAGNOSIS — G9340 Encephalopathy, unspecified: Secondary | ICD-10-CM | POA: Diagnosis not present

## 2017-07-10 DIAGNOSIS — J45909 Unspecified asthma, uncomplicated: Secondary | ICD-10-CM | POA: Diagnosis not present

## 2017-07-10 DIAGNOSIS — I10 Essential (primary) hypertension: Secondary | ICD-10-CM | POA: Diagnosis not present

## 2017-07-10 DIAGNOSIS — N390 Urinary tract infection, site not specified: Secondary | ICD-10-CM | POA: Diagnosis not present

## 2017-07-12 ENCOUNTER — Other Ambulatory Visit: Payer: Medicare PPO

## 2017-07-17 DIAGNOSIS — I10 Essential (primary) hypertension: Secondary | ICD-10-CM | POA: Diagnosis not present

## 2017-07-17 DIAGNOSIS — G9340 Encephalopathy, unspecified: Secondary | ICD-10-CM | POA: Diagnosis not present

## 2017-07-17 DIAGNOSIS — N390 Urinary tract infection, site not specified: Secondary | ICD-10-CM | POA: Diagnosis not present

## 2017-07-17 DIAGNOSIS — J45909 Unspecified asthma, uncomplicated: Secondary | ICD-10-CM | POA: Diagnosis not present

## 2017-07-22 ENCOUNTER — Encounter: Payer: Self-pay | Admitting: Hematology

## 2017-07-22 ENCOUNTER — Ambulatory Visit (HOSPITAL_BASED_OUTPATIENT_CLINIC_OR_DEPARTMENT_OTHER): Payer: Medicare PPO | Admitting: Hematology

## 2017-07-22 ENCOUNTER — Other Ambulatory Visit (HOSPITAL_BASED_OUTPATIENT_CLINIC_OR_DEPARTMENT_OTHER): Payer: Medicare PPO

## 2017-07-22 VITALS — BP 176/59 | HR 68 | Temp 98.3°F | Resp 18 | Ht 63.0 in | Wt 171.6 lb

## 2017-07-22 DIAGNOSIS — G4733 Obstructive sleep apnea (adult) (pediatric): Secondary | ICD-10-CM

## 2017-07-22 DIAGNOSIS — C773 Secondary and unspecified malignant neoplasm of axilla and upper limb lymph nodes: Secondary | ICD-10-CM | POA: Diagnosis not present

## 2017-07-22 DIAGNOSIS — R739 Hyperglycemia, unspecified: Secondary | ICD-10-CM | POA: Diagnosis not present

## 2017-07-22 DIAGNOSIS — R413 Other amnesia: Secondary | ICD-10-CM | POA: Diagnosis not present

## 2017-07-22 DIAGNOSIS — Z170 Estrogen receptor positive status [ER+]: Secondary | ICD-10-CM | POA: Diagnosis not present

## 2017-07-22 DIAGNOSIS — C50311 Malignant neoplasm of lower-inner quadrant of right female breast: Secondary | ICD-10-CM

## 2017-07-22 DIAGNOSIS — I10 Essential (primary) hypertension: Secondary | ICD-10-CM

## 2017-07-22 DIAGNOSIS — M85862 Other specified disorders of bone density and structure, left lower leg: Secondary | ICD-10-CM

## 2017-07-22 DIAGNOSIS — E119 Type 2 diabetes mellitus without complications: Secondary | ICD-10-CM

## 2017-07-22 DIAGNOSIS — M85861 Other specified disorders of bone density and structure, right lower leg: Secondary | ICD-10-CM

## 2017-07-22 DIAGNOSIS — Z79811 Long term (current) use of aromatase inhibitors: Secondary | ICD-10-CM | POA: Diagnosis not present

## 2017-07-22 DIAGNOSIS — I890 Lymphedema, not elsewhere classified: Secondary | ICD-10-CM | POA: Diagnosis not present

## 2017-07-22 LAB — CBC WITH DIFFERENTIAL/PLATELET
BASO%: 0.1 % (ref 0.0–2.0)
Basophils Absolute: 0 10*3/uL (ref 0.0–0.1)
EOS%: 2.3 % (ref 0.0–7.0)
Eosinophils Absolute: 0.2 10*3/uL (ref 0.0–0.5)
HCT: 30.8 % — ABNORMAL LOW (ref 34.8–46.6)
HGB: 9.1 g/dL — ABNORMAL LOW (ref 11.6–15.9)
LYMPH%: 22.5 % (ref 14.0–49.7)
MCH: 24.7 pg — ABNORMAL LOW (ref 25.1–34.0)
MCHC: 29.5 g/dL — ABNORMAL LOW (ref 31.5–36.0)
MCV: 83.5 fL (ref 79.5–101.0)
MONO#: 0.5 10*3/uL (ref 0.1–0.9)
MONO%: 6.6 % (ref 0.0–14.0)
NEUT%: 68.5 % (ref 38.4–76.8)
NEUTROS ABS: 5.6 10*3/uL (ref 1.5–6.5)
Platelets: 356 10*3/uL (ref 145–400)
RBC: 3.69 10*6/uL — AB (ref 3.70–5.45)
RDW: 18.1 % — ABNORMAL HIGH (ref 11.2–14.5)
WBC: 8.2 10*3/uL (ref 3.9–10.3)
lymph#: 1.9 10*3/uL (ref 0.9–3.3)

## 2017-07-22 NOTE — Progress Notes (Signed)
[place]  [phone] [phone]  Clinic Follow Up Note   Patient Care Team: [name], NP as PCP - General (Internal Medicine) 07/22/2017   CHIEF COMPLAINTS:  Follow Up right breast cancer  . Oncology History   Breast cancer of lower-inner quadrant of right female breast [facility])   Staging form: Breast, AJCC 7th Edition   - Clinical stage from 06/09/2016: Stage IIB (T2, N1, M0) - Signed by Truitt Merle, MD on 06/27/2016   - Pathologic stage from 07/29/2016: Stage IIA (T1c, N1a, cM0) - Signed by Truitt Merle, MD on 08/13/2016       Breast cancer of lower-inner quadrant of right female breast ([place])   05/23/2016 Mammogram    Diagnostic mammogram and ultrasound showed suspicious architectural distortion within the right breast lower inner quadrant, measuring 1.3 cm, without sonographic corelate.      06/03/2016 Initial Biopsy    Right breast inferior lower quadrant core needle biopsy showed atypical ductal hyperplasia with calcifications      06/09/2016 Receptors her2    Breast biopsy showed ER 100% positive, PR 100% positive, HER-2 negative, Ki-67 40%      06/09/2016 Initial Diagnosis    Breast cancer of lower-inner quadrant of right female breast ([place])      06/09/2016 Initial Biopsy    Right breast inner quadrant core needle biopsy showed invasive ductal carcinoma and DCIS, grade 1-2      06/17/2016 Initial Biopsy    Right axillary lymph node core needle biopsy showed metastatic carcinoma      06/17/2016 Receptors her2    Axillary node biopsy showed ER 100% positive, PR 95% positive, HER-2 negative      06/19/2016 Imaging    Bilateral breast MRI showed locations in the lower inner right breast, largest 2.7X1.3X1.6cm, biopsy clips in 2 of this areas. There are abnormal right axillary lymph nodes showing second cortical's, no evidence of malignancy in the left breast.      07/29/2016 Surgery    Right lumpectomy and ALND      07/29/2016  Pathology Results    Right lumpectomy showed G3 IDC, DCIS, margins (-), LVI(-).  1 of 16 nodes was positive       07/29/2016 Miscellaneous    Mammaprint showed high risk disease, luminal type B      09/09/2016 - 11/12/2016 Adjuvant Chemotherapy    Docetaxel and Cytoxan (TC) every 3 weeks      12/24/2016 - 02/03/2017 Radiation Therapy    Adjuvant breast radiation 12/24/16 - 02/03/17 : Right Breast treated to 50.4 Gy in 28 fractions. Right Axilla treated to 45 Gy in 25 fractions.      02/17/2017 -  Anti-estrogen oral therapy    Letrozole 2.5 mg daily       03/23/2017 Imaging    DG Bone Density 03/23/17 ASSESSMENT: The BMD measured at Femur Neck Right is 0.809 g/cm2 with a T-score of -1.6. This patient is considered osteopenic according to [facility]) criteria.       HISTORY OF PRESENTING ILLNESS:  Tiffany Arnold 76 y.o. female is here because of her recently diagnosed left breast cancer. She presents to my clinic with her friend, who is a Marine scientist.   Her cancer was discovered by screening mammogram. She had a right breast cyst in 2001, which was removed. She has been doing mammogram once a year. The mammogram and ultrasound on 05/23/2016 showed a suspicious architectural this portion, 1.3 cm, without sonographic correlation.  She underwent core needle biopsy of the right breast mass twice and right axilla node biopsy, one breast biopsy and node biopsy showed invasive ductal carcinoma and DCIS, ER/PR strong positive, HER-2 negative.  She denies any other new symptoms. She has noticed mild fatigued lately, she still works full time in a vet's office, she has IBS, has intermittent constipation and diarrhea. She has arthritis, and both knee replacement and shoulder surgery before, she also has some back pain lately, she takes tylenol occasionally.  She lives with her husband, moderately active. No family history of breast cancer  GYN HISTORY  Menarchal: 13 LMP:  65 Contraceptive: 4-5 years HRT: 3 years  G2P2: no breast feeding, daughter 63 yo and son is 5 yo.   CURRENT THERAPY: Letrozole 2.81m daily since 02/17/17  INTERIM HISTORY: Tiffany VGirgisreturns for follow up. She present sin the clinic today with her sister and husband. Due to her confusion her family will help answer questions. She recenlty saw neurologist and had aphasia but now it is much improved. She went to the hospital for a UTI twice now. [place] [facility]. The urologist had a LP and she has an upcoming mammogram. She reports the infection effected her this way. The steroids elevated her sugar previously. She was put on glipizide and her sugar was so low she was almost comatose. She is no longer on Glipizide and her BP medication she is no longer on. She has not been checking BP at home. She is abel to do PT and exercise, she can do 580 hours a week.  She use to have sever pain issues, but now she is able to go to sleep. She has anxiety and anger that is directed at her husband, she can get physical.  She has incontinence  She is able to do partial self-care, does needs assistance and supervision most of time. She is moving to a memory unit soon    MEDICAL HISTORY:  Past Medical History:  Diagnosis Date  . Allergy   . Aphasia 06/22/2017  . Arthritis   . Asthma   . Cancer ([place] 07/29/2016   right breast  . Cataract of both eyes   . Chicken pox   . Colon polyps   . Dog bite of index finger 07/23/2016   Left index finger  . GERD (gastroesophageal reflux disease)   . History of radiation therapy 12/24/16- 02/03/17   Right Breast 50.4 Gy in 28 fractions, Right Axilla 45 Gy in 25 fractions.  . Hyperlipidemia   . Hypertension   . IBS (irritable bowel syndrome)   . Memory disorder 06/22/2017  . Phlebitis   . Pneumonia    hx of several yrs ago  . Shortness of breath dyspnea    with exertion only  . Sleep apnea    wears CPAP machine nightly    SURGICAL  HISTORY: Past Surgical History:  Procedure Laterality Date  . BACK SURGERY     lower  . BREAST CYST EXCISION Right 2001   negative  . BREAST LUMPECTOMY WITH NEEDLE LOCALIZATION AND AXILLARY LYMPH NODE DISSECTION Right 07/29/2016   Procedure: RIGHT BREAST LUMPECTOMY WITH DOUBLE NEEDLE LOCALIZATION AND COMPLETE RIGHT AXILLARY LYMPH NODE DISSECTION;  Surgeon: HFanny Skates MD;  [facility]  Service: General;  Laterality: Right;  . BREAST SURGERY Left 2000   Biopsy  . BUNIONECTOMY Bilateral 1998   great toe fusion on right foot  . COLONOSCOPY W/ POLYPECTOMY    .  HERNIA REPAIR  [id number]   [place] SINUS SURGERY  2015  . PORTACATH PLACEMENT N/A 09/05/2016   Procedure: INSERTION PORT-A-CATH;  Surgeon: Fanny Skates, MD;  Location: [facility];  Service: General;  Laterality: N/A;  . REPLACEMENT TOTAL KNEE Bilateral 2007  . TOTAL SHOULDER REPLACEMENT Left 2007    SOCIAL HISTORY: Social History   Social History  . Marital status: Married    Spouse name: N/A  . Number of children: 2  . Years of education: 2 years college   Occupational History  . Retired    Social History Main Topics  . Smoking status: Former Smoker    Packs/day: 2.00    Years: 25.00    Quit date: 12/22/1990  . Smokeless tobacco: Never Used     Comment: quit 24 years ago  . Alcohol use Yes     Comment: rare  . Drug use: No  . Sexual activity: Not Currently   Other Topics Concern  . Not on file   Social History Narrative   Lives at home with her husband.   Right-handed.   Occasional caffeine use.    FAMILY HISTORY: Family History  Problem Relation Age of Onset  . Arthritis Mother   . Stroke Mother   . Hypertension Mother   . Cancer Father        Prostate  . Stroke Father   . Hypertension Father   . Hypertension Maternal Grandmother   . Rheum arthritis Maternal Grandfather   . Stroke Maternal Grandfather   . Hypertension Maternal Grandfather   . Cancer Paternal Grandmother        Colon    . Hypertension Paternal Grandmother   . Hypertension Paternal Grandfather   . Breast cancer Neg Hx     ALLERGIES:  is allergic to other.  MEDICATIONS:  Current Outpatient Prescriptions  Medication Sig Dispense Refill  . albuterol-ipratropium (COMBIVENT) 18-103 MCG/ACT inhaler Inhale 2 puffs into the lungs every 4 (four) hours as needed for wheezing or shortness of breath.     . ALPRAZolam (XANAX) 0.25 MG tablet TAKE ONE TABLET BY MOUTH TWICE DAILY AS NEEDED (Patient taking differently: TAKE ONE TABLET BY MOUTH TWICE DAILY) 60 tablet 0  . Calcium Carbonate-Vitamin D (CALCIUM-VITAMIN D) 500-200 MG-UNIT tablet Take 1 tablet by mouth daily.    . cephALEXin (KEFLEX) 500 MG capsule Take 1 capsule (500 mg total) by mouth 2 (two) times daily. 8 capsule 0  . cetirizine (ZYRTEC) 10 MG tablet Take 10 mg by mouth daily.    . Cholecalciferol (VITAMIN D3) 1000 units CAPS Take by mouth 2 (two) times daily.    . diphenoxylate-atropine (LOMOTIL) 2.5-0.025 MG tablet Take 2 tablets by mouth 4 (four) times daily as needed for diarrhea or loose stools. 30 tablet 0  . EPINEPHrine 0.3 mg/0.3 mL IJ SOAJ injection Inject 0.3 mg into the muscle once as needed (ALLERGIC REACTION).     . feeding supplement, ENSURE ENLIVE, (ENSURE ENLIVE) LIQD Take 237 mLs by mouth 3 (three) times daily between meals. 237 mL 12  . glucosamine-chondroitin 500-400 MG tablet Take 1 tablet by mouth 2 (two) times daily.    Marland Kitchen letrozole (FEMARA) 2.5 MG tablet Take 1 tablet (2.5 mg total) by mouth daily. 30 tablet 5  . naproxen sodium (ANAPROX) 220 MG tablet Take 220 mg by mouth 2 (two) times daily with a meal.    . sertraline (ZOLOFT) 25 MG tablet TAKE ONE (1) TABLET EACH DAY 90 tablet 0  .  traMADol (ULTRAM) 50 MG tablet Take 1 tablet (50 mg total) by mouth every 8 (eight) hours as needed. 90 tablet 0  . vitamin B-12 (CYANOCOBALAMIN) 1000 MCG tablet Take 1,000 mcg by mouth daily.     No current facility-administered medications for this  visit.     REVIEW OF SYSTEMS:   Constitutional: Denies fevers, chills or abnormal night sweats (+) falls Eyes: Denies blurriness of vision, double vision or watery eyes Ears, nose, mouth, throat, and face: Denies mucositis or sore throat Respiratory: Denies cough, dyspnea or wheezes Cardiovascular: Denies palpitation, chest discomfort or lower extremity swelling Gastrointestinal:  Denies heartburn or change in bowel habits  Urinary: (+) urinary incontinence  Skin: Denies abnormal skin rashes Lymphatics: Denies new lymphadenopathy or easy bruising. (+) Right arm lymphedema with sleeve. Neurological:Denies numbness, tingling or new weaknesses (+) dizziness (+) memory loss Behavioral/Psych: Mood is stable, no new changes  All other systems were reviewed with the patient and are negative.   PHYSICAL EXAMINATION: ECOG PERFORMANCE STATUS: 3  Vitals:   07/22/17 1523  BP: (!) 176/59  Pulse: 68  Resp: 18  Temp: 98.3 F (36.8 C)   Filed Weights   07/22/17 1523  Weight: 171 lb 9.6 oz (77.8 kg)     GENERAL:alert, no distress and comfortable SKIN: skin color, texture, turgor are normal, no rashes or significant lesions EYES: normal, conjunctiva are pink and non-injected, sclera clear OROPHARYNX:no exudate, no erythema and lips, buccal mucosa, and tongue normal  NECK: supple, thyroid normal size, non-tender, without nodularity LYMPH:  no palpable lymphadenopathy in the cervical, axillary or inguinal (+) Right arm lymphedema LUNGS: clear to auscultation and percussion with normal breathing effort HEART: regular rate & rhythm and no murmurs and no lower extremity edema ABDOMEN:abdomen soft, non-tender and normal bowel sounds Musculoskeletal:no cyanosis of digits and no clubbing  PSYCH: alert & oriented x 3 with fluent speech NEURO: no focal motor/sensory deficits Breasts: Breast inspection showed them to be symmetrical with no nipple discharge. Palpation of the breasts and axilla  revealed no obvious mass that I could appreciate.  LABORATORY DATA:  I have reviewed the data as listed CBC Latest Ref Rng & Units 07/22/2017 07/07/2017 07/06/2017  WBC 3.9 - 10.3 10e3/uL 8.2 9.4 10.8  Hemoglobin 11.6 - 15.9 g/dL 9.1(L) 8.8(L) 9.6(L)  Hematocrit 34.8 - 46.6 % 30.8(L) 27.4(L) 30.2(L)  Platelets 145 - 400 10e3/uL 356 419 410   CMP Latest Ref Rng & Units 07/09/2017 07/06/2017 06/12/2017  Glucose 65 - 99 mg/dL 117(H) 141(H) 100(H)  BUN 6 - 20 mg/dL 25(H) 25(H) 21  Creatinine 0.44 - 1.00 mg/dL 0.61 0.65 0.62  Sodium 135 - 145 mmol/L 139 138 138  Potassium 3.5 - 5.1 mmol/L 3.4(L) 4.5 4.7  Chloride 101 - 111 mmol/L 101 100(L) 99  CO2 22 - 32 mmol/L 29 28 33(H)  Calcium 8.9 - 10.3 mg/dL 10.3 10.4(H) 10.6(H)  Total Protein 6.5 - 8.1 g/dL - 7.4 7.1  Total Bilirubin 0.3 - 1.2 mg/dL - 1.0 0.5  Alkaline Phos 38 - 126 U/L - 83 150(H)  AST 15 - 41 U/L - 27 16  ALT 14 - 54 U/L - 12(L) 15    PATHOLOGY REPORT  Diagnosis 06/03/2016 Breast, right, needle core biopsy, ILQ focal 1.3 cm asymmetry/distortion - ATYPICAL DUCTAL HYPERPLASIA WITH CALCIFICATIONS. - FIBROCYSTIC CHANGES WITH CALCIFICATIONS. - SEE COMMENT. Microscopic Comment The results were called to [facility] on 06/04/16. (JBK:ds 06/04/16)   Diagnosis 06/09/2016 Breast, right, needle core biopsy,  inner - INVASIVE DUCTAL CARCINOMA. - DUCTAL CARCINOMA IN SITU. - SEE COMMENT. Microscopic Comment The carcinoma appears grade 1-2. A breast prognostic profile will be performed and the results reported separately. The results were called to [facility] on 06/10/2016. (JBK:ecj 06/10/2016) Results: HER2 - NEGATIVE RATIO OF HER2/CEP17 SIGNALS 1.55 AVERAGE HER2 COPY NUMBER PER CELL 2.25 Results: IMMUNOHISTOCHEMICAL AND MORPHOMETRIC ANALYSIS PERFORMED MANUALLY Estrogen Receptor: 100%, POSITIVE, STRONG STAINING INTENSITY Progesterone Receptor: 100%, POSITIVE, STRONG STAINING  INTENSITY Proliferation Marker Ki67: 40%   Diagnosis 06/17/2016 Lymph node, needle/core biopsy, right, inferior, axilla to far lateral breast - METASTATIC CARCINOMA, SEE COMMENT. Microscopic Comment The morphology is consistent with the patient breast carcinoma. Prognostic markers will be ordered and reported in an addendum. The case was called to [facility] on 06/18/2016. Results: IMMUNOHISTOCHEMICAL AND MORPHOMETRIC ANALYSIS PERFORMED MANUALLY Estrogen Receptor: 100%, POSITIVE, STRONG STAINING INTENSITY Progesterone Receptor: 95%, POSITIVE, STRONG STAINING INTENSITY Results: HER2 - NEGATIVE RATIO OF HER2/CEP17 SIGNALS 1.25 AVERAGE HER2 COPY NUMBER PER CELL 2.00  Diagnosis 07/29/2016 1. Breast, lumpectomy, Right INVASIVE DUCTAL CARCINOMA, GRADE 3, SPANNING 1.5 CM DUCTAL CARCINOMA IN SITU IS PRESENT ALL MARGINS OF RESECTION ARE NEGATIVE FOR CARCINOMA 2. Lymph nodes, regional resection, Right axillary contents METASTATIC BREAST DUCTAL CARCINOMA IN ONE OF SIXTEEN LYMPH NODES (1/16) Specimen, including laterality and lymph node sampling (sentinel, non-sentinel): Right partial breast and regional lymph nodes Procedure: Lumpectomy Histologic type: Ductal carcinoma Grade: 3 Tubule formation: 3 Nuclear pleomorphism: 2 Mitotic:3 Tumor size (gross measurement or glass slide measurement): 1.5 cm Margins: Invasive, distance to closest margin: 0.7 cm In-situ, distance to closest margin: 0.7 cm If margin positive, focally or broadly: NA Lymphovascular invasion: Not identified Ductal carcinoma in situ: Present Grade: 3 Extensive intraductal component: moderate Lobular neoplasia: Negative Tumor focality: Focal Treatment effect: Negative If present, treatment effect in breast tissue, lymph nodes or both: NA Extent of tumor: Skin: Negative Nipple: Negative Skeletal muscle: Negative Lymph nodes: Examined: 0 Sentinel 16 Non-sentinel 16 Total Lymph nodes with  metastasis: 1 Isolated tumor cells (< 0.2 mm): 0 Micrometastasis: (> 0.2 mm and < 2.0 mm): 0 Macrometastasis: (> 2.0 mm): 1 Extracapsular extension: Present Breast prognostic profile: Estrogen receptor: 100% Progesterone receptor: 100% Her 2 neu: Negative Ki-67: 40% Non-neoplastic breast: Unremarkable TNM: pT1c, pN1  RADIOGRAPHIC STUDIES: I have personally reviewed the radiological images as listed and agreed with the findings in the report. Ct Head Wo Contrast  Result Date: 07/06/2017 CLINICAL DATA:  Altered mental status. EXAM: CT HEAD WITHOUT CONTRAST TECHNIQUE: Contiguous axial images were obtained from the base of the skull through the vertex without intravenous contrast. COMPARISON:  09/14/2012. FINDINGS: Brain: Diffusely enlarged ventricles and subarachnoid spaces. Patchy white matter low density in both cerebral hemispheres. No intracranial hemorrhage, mass lesion or CT evidence of acute infarction. Vascular: No hyperdense vessel or unexpected calcification. Skull: Normal. Negative for fracture or focal lesion. Sinuses/Orbits: Surgical absence of the medial walls of both maxillary sinuses. Unremarkable orbits. Other: None. IMPRESSION: No acute abnormality. Progressive mild diffuse cerebral and cerebellar atrophy and moderate chronic small vessel white matter ischemic changes in both cerebral hemispheres. Electronically Signed   By: Claudie Revering M.D.   On: 07/06/2017 13:54   Mr Jeri Cos JQ Contrast  Result Date: 07/07/2017 CLINICAL DATA:  Altered mental status, evaluate acute encephalopathy. History of breast can for Ing concern for metastasis. History of recurrent urinary tract infections, hypertension. EXAM: MRI HEAD WITHOUT AND WITH CONTRAST TECHNIQUE: Multiplanar, multiecho pulse sequences of  the brain and surrounding structures were obtained without and with intravenous contrast. CONTRAST:  34m MULTIHANCE GADOBENATE DIMEGLUMINE 529 MG/ML IV SOLN COMPARISON:  CT HEAD July 06, 2017 at  1344 hours and CT HEAD September 14, 2012 FINDINGS: BRAIN: No reduced diffusion to suggest acute ischemia or hypercellular tumor . No susceptibility artifact to suggest hemorrhage. The ventricles and sulci are normal for patient's age. Patchy to confluent supratentorial pontine white matter FLAIR T2 hyperintensities. No suspicious parenchymal signal, mass or mass effect. Old small RIGHT cerebellar infarct. No abnormal intraparenchymal or extra-axial enhancement No abnormal extra-axial fluid collections. VASCULAR: Normal major intracranial vascular flow voids present at skull base. SKULL AND UPPER CERVICAL SPINE: No abnormal sellar expansion. No suspicious calvarial bone marrow signal. 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 Craniocervical junction maintained. SINUSES/ORBITS: The mastoid air-cells and included paranasal sinuses are well-aerated. The included ocular globes and orbital contents are non-suspicious. Status post LEFT ocular lens implant. OTHER: None. IMPRESSION: 1. No acute intracranial process or metastasis. 2. Moderate chronic small vessel ischemic disease. 3. Old small RIGHT cerebellar infarct. Electronically Signed   By: CElon AlasM.D.   On: 07/07/2017 00:37   Dg Fluoro Guided Loc Of Needle/cath Tip For Spinal Inject Lt  Result Date: 07/08/2017 CLINICAL DATA:  Confused, acute on chronic encephalopathy. EXAM: DIAGNOSTIC LUMBAR PUNCTURE UNDER FLUOROSCOPIC GUIDANCE FLUOROSCOPY TIME:  Fluoroscopy Time:  0.3 minutes PROCEDURE: Informed consent was obtained from the patient prior to the procedure, including potential complications of headache, allergy, and pain. With the patient prone, the lower back was prepped with Betadine. 1% Lidocaine was used for local anesthesia. Lumbar puncture was performed at the L4-5 level using a 22 gauge needle with return of clear CSF. 9 ml of CSF were obtained for laboratory studies. The patient  tolerated the procedure well and there were no apparent complications. IMPRESSION: Successful fluoroscopic guided lumbar puncture. Electronically Signed   By: HKathreen Devoid  On: 07/08/2017 16:49   Diagnostic mammogram and ultrasound of right breast including right axillary 05/23/2016 IMPRESSION: Suspicious architectural distortion within the lower inner quadrant of the right breast, measuring 1.3 cm greatest dimension, without sonographic correlate. This distortion could conceivably be related to the patient's earlier surgical excision biopsy perform in 2001 (patient states that this earlier surgical excision was in this same region of her right breast), however, exclusion of a neoplastic cause is needed. As such, stereotactic-guided biopsy, with 3D tomosynthesis, is recommended for this suspicious finding.  [place] Im[place] Upper Uni Right 10/09/2016 IMPRESSION: No evidence of DVT within the right upper extremity.  DG Bone Density 03/23/17 ASSESSMENT: The BMD measured at Femur Neck Right is 0.809 g/cm2 with a T-score of -1.6. This patient is considered osteopenic according to [place] criteria.L3was excluded due to degenerative changes. Site Region Measured Measured WHO Young Adult BMD Date       Age      Classification T-score DualFemur Neck Right 5/23/[age] Osteopenia -1.6 0.809 g/cm2  ASSESSMENT & PLAN: 76y.o.Caucasian female, with mammogram discovered right breast cancer.  1. 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  -We previously reviewed her mammaprint genomic test result, which showed luminal type  B, high risk, average 10-year risk of recurrence without adjuvant therapy  is 29% -given the high risk disease, she received adjuvant docetaxel and cytoxan every 3 weeks for 4 cycles   -She has completed adjuvant chemotherapy -Adjuvant radiation. 12/24/16 - 02/03/17 : Right Breast treated to 50.4 Gy in 28 fractions. Right Axilla treated to 45 Gy in 25 fractions. -She started adjuvant letrozole, tolerating well so far. However she has developed significant memory loss, cognitive dysfunction, and intermittent confusion, to the point she needs assistance for her ADLs. Also I do not think this is a common side effect from letrozole, I recommend her to stop letrozole for 2-3 months, to see if her neurological symptoms improves. - She will continue continue breast cancer surveillance with screening mammogram, self exam, and routine follow-up with [place] for labs and exam. -I encouraged her to have healthy diet and exercise regularly. -Labs reviewed except slightly anemic, exam was unremarkable, no clinical concern for cancer recurrence. -Bilateral screening mammogram will be scheduled in 07/2017. -Hold letrozole for now, f/u in 2 months   2. Memory loss/cognitive dysfuntion and intermittent confusion -Starts at about 2 months ago when she had urosepsis twice -She has been seeing a neurologist, All workup including brain MRI and LP has been negative. The CSF was sent for cytology, the result is not available, I have asked our pathology laboratory to track it down.   -Given the negative brain MRI, an overall improving neurological symptoms, I do not have a high suspicion this is leptomeningeal disease. I'll track down her CSF cytology. -I suggest her to hold letrozole for a couple of months to see if her neurologist symptoms improves.  3.  Hyperglycemia -She had borderline hyperglycemia before. She developed steroid-induced hyperglycemia during the chemotherapy -She was on glipizide, developed severe hypoglycemia, has been off now. -She will continue follow-up with her primary care  physician and monitor her blood glucose.  4. Right upper extremity lymphedema -She is quite significant right upper extremity lymphedema after the axillary node dissection -She has completed physical therapy, I encouraged her to continue manual drainage at home, and wear sleeves  5. Hypertension and arthritis -She will continue medication and follow-up with her primary care physician -She has been off blood pressure medication due to her neurologist symptoms and normal blood pressure. She will continue follow-up with her primary care physician   6. Morbid obesity -I encouraged her to have healthy diet and exercise regularly, she has lost some weight lately due to her neurological issues.  7. Bone Health -We discussed how the AI could effect her bones.  -Bone scan 03/23/17. Femur Neck Right T-score -1.6; high risk osteopenic. -continue calcium and vitamin D.   Plan -Hold letrozole -lab and F/u in 2 months  -Mammogram in 07/2017 -I will follow up her CSF cytology result    All questions were answered. The patient knows to call the clinic with any problems, questions or concerns.  I spent 30 minutes counseling the patient face to face. The total time spent in the appointment was 35 minutes and more than 50% was on counseling.    Truitt Merle, MD 07/22/2017   This document serves as a record of services personally performed by Truitt Merle, MD. It was created on her behalf by Joslyn Devon, a trained medical scribe. The creation of this record is based on the scribe's personal observations and the provider's statements to them. This document has been checked and approved by the attending provider.

## 2017-07-23 ENCOUNTER — Telehealth: Payer: Self-pay | Admitting: Hematology

## 2017-07-23 DIAGNOSIS — N390 Urinary tract infection, site not specified: Secondary | ICD-10-CM | POA: Diagnosis not present

## 2017-07-23 DIAGNOSIS — J45909 Unspecified asthma, uncomplicated: Secondary | ICD-10-CM | POA: Diagnosis not present

## 2017-07-23 DIAGNOSIS — G9340 Encephalopathy, unspecified: Secondary | ICD-10-CM | POA: Diagnosis not present

## 2017-07-23 DIAGNOSIS — I10 Essential (primary) hypertension: Secondary | ICD-10-CM | POA: Diagnosis not present

## 2017-07-23 LAB — VITAMIN D 25 HYDROXY (VIT D DEFICIENCY, FRACTURES): Vitamin D, 25-Hydroxy: 46.3 ng/mL (ref 30.0–100.0)

## 2017-07-23 NOTE — Telephone Encounter (Signed)
Spoke with patient regarding her upcoming appointments in November.

## 2017-07-24 LAB — CYTOLOGY - NON PAP

## 2017-07-27 ENCOUNTER — Telehealth: Payer: Self-pay

## 2017-07-27 ENCOUNTER — Other Ambulatory Visit: Payer: Medicare PPO

## 2017-07-27 NOTE — Telephone Encounter (Signed)
Tiffany Arnold (DPR signed) left v/m; pt is at [facility] on [facility]. When pt got there having symptoms of possible UTI; pt is presently on abx and on 07/26/17 found blood in urine. [name] is concerned about ongoing UTIs and ask that [name] NP review her chart to see if any other testing needs to be done. [name] request cb.

## 2017-07-27 NOTE — Telephone Encounter (Signed)
No further testing needs to be done unless symptoms persist despite abx

## 2017-07-28 NOTE — Telephone Encounter (Signed)
I spoke to [place] and she is aware as instructed and expressed understanding

## 2017-07-30 DIAGNOSIS — R531 Weakness: Secondary | ICD-10-CM | POA: Diagnosis not present

## 2017-07-30 DIAGNOSIS — I10 Essential (primary) hypertension: Secondary | ICD-10-CM | POA: Diagnosis not present

## 2017-07-30 DIAGNOSIS — F0391 Unspecified dementia with behavioral disturbance: Secondary | ICD-10-CM | POA: Diagnosis not present

## 2017-07-30 DIAGNOSIS — R4701 Aphasia: Secondary | ICD-10-CM | POA: Diagnosis not present

## 2017-07-30 DIAGNOSIS — F419 Anxiety disorder, unspecified: Secondary | ICD-10-CM | POA: Diagnosis not present

## 2017-07-30 DIAGNOSIS — R2681 Unsteadiness on feet: Secondary | ICD-10-CM | POA: Diagnosis not present

## 2017-07-30 DIAGNOSIS — Z9181 History of falling: Secondary | ICD-10-CM | POA: Diagnosis not present

## 2017-07-30 DIAGNOSIS — E119 Type 2 diabetes mellitus without complications: Secondary | ICD-10-CM | POA: Diagnosis not present

## 2017-08-03 DIAGNOSIS — R269 Unspecified abnormalities of gait and mobility: Secondary | ICD-10-CM | POA: Diagnosis not present

## 2017-08-04 DIAGNOSIS — F0391 Unspecified dementia with behavioral disturbance: Secondary | ICD-10-CM | POA: Diagnosis not present

## 2017-08-04 DIAGNOSIS — Z9181 History of falling: Secondary | ICD-10-CM | POA: Diagnosis not present

## 2017-08-04 DIAGNOSIS — R4701 Aphasia: Secondary | ICD-10-CM | POA: Diagnosis not present

## 2017-08-04 DIAGNOSIS — E119 Type 2 diabetes mellitus without complications: Secondary | ICD-10-CM | POA: Diagnosis not present

## 2017-08-04 DIAGNOSIS — R531 Weakness: Secondary | ICD-10-CM | POA: Diagnosis not present

## 2017-08-04 DIAGNOSIS — F419 Anxiety disorder, unspecified: Secondary | ICD-10-CM | POA: Diagnosis not present

## 2017-08-04 DIAGNOSIS — R2681 Unsteadiness on feet: Secondary | ICD-10-CM | POA: Diagnosis not present

## 2017-08-04 DIAGNOSIS — I10 Essential (primary) hypertension: Secondary | ICD-10-CM | POA: Diagnosis not present

## 2017-08-05 ENCOUNTER — Telehealth: Payer: Self-pay | Admitting: Neurology

## 2017-08-05 ENCOUNTER — Ambulatory Visit (INDEPENDENT_AMBULATORY_CARE_PROVIDER_SITE_OTHER): Payer: Medicare PPO | Admitting: Neurology

## 2017-08-05 DIAGNOSIS — R4701 Aphasia: Secondary | ICD-10-CM | POA: Diagnosis not present

## 2017-08-05 NOTE — Telephone Encounter (Signed)
I called patient. EEG study was unremarkable.  Spinal fluid analysis showed elevated protein level, but the CSF cytology was negative.

## 2017-08-05 NOTE — Procedures (Signed)
    History:  Tiffany Arnold is a 76 year old patient with a history of a memory disturbance and a prior history of breast cancer. The patient has had onset of confusion around 04/27/2017. The patient has had episodes of documented low blood sugar as well. The patient is being evaluated for the relatively sudden change in memory.  This is a routine EEG. No skull defects are noted. Medications include calcium supplementation, vitamin D, naproxen, Zoloft, Ultram, Combivent, and alprazolam.   EEG classification: Normal awake and drowsy  Description of the recording: The background rhythms of this recording consists of a fairly well modulated medium amplitude alpha rhythm of 9 Hz that is reactive to eye opening and closure. As the record progresses, the patient appears to remain in the waking state throughout the recording. Photic stimulation was performed, resulting in a bilateral and symmetric photic driving response. Hyperventilation was not performed. Toward the end of the recording, the patient enters the drowsy state with slight symmetric slowing seen. The patient never enters stage II sleep. At no time during the recording does there appear to be evidence of spike or spike wave discharges or evidence of focal slowing. EKG monitor shows no evidence of cardiac rhythm abnormalities with a heart rate of 72.  Impression: This is a normal EEG recording in the waking and drowsy state. No evidence of ictal or interictal discharges are seen.

## 2017-08-07 DIAGNOSIS — F419 Anxiety disorder, unspecified: Secondary | ICD-10-CM | POA: Diagnosis not present

## 2017-08-07 DIAGNOSIS — I10 Essential (primary) hypertension: Secondary | ICD-10-CM | POA: Diagnosis not present

## 2017-08-07 DIAGNOSIS — Z9181 History of falling: Secondary | ICD-10-CM | POA: Diagnosis not present

## 2017-08-07 DIAGNOSIS — R2681 Unsteadiness on feet: Secondary | ICD-10-CM | POA: Diagnosis not present

## 2017-08-07 DIAGNOSIS — E119 Type 2 diabetes mellitus without complications: Secondary | ICD-10-CM | POA: Diagnosis not present

## 2017-08-07 DIAGNOSIS — R4701 Aphasia: Secondary | ICD-10-CM | POA: Diagnosis not present

## 2017-08-07 DIAGNOSIS — R531 Weakness: Secondary | ICD-10-CM | POA: Diagnosis not present

## 2017-08-07 DIAGNOSIS — F0391 Unspecified dementia with behavioral disturbance: Secondary | ICD-10-CM | POA: Diagnosis not present

## 2017-08-07 DIAGNOSIS — G4733 Obstructive sleep apnea (adult) (pediatric): Secondary | ICD-10-CM | POA: Diagnosis not present

## 2017-08-10 DIAGNOSIS — F0391 Unspecified dementia with behavioral disturbance: Secondary | ICD-10-CM | POA: Diagnosis not present

## 2017-08-10 DIAGNOSIS — R4701 Aphasia: Secondary | ICD-10-CM | POA: Diagnosis not present

## 2017-08-10 DIAGNOSIS — I10 Essential (primary) hypertension: Secondary | ICD-10-CM | POA: Diagnosis not present

## 2017-08-10 DIAGNOSIS — E119 Type 2 diabetes mellitus without complications: Secondary | ICD-10-CM | POA: Diagnosis not present

## 2017-08-10 DIAGNOSIS — Z9181 History of falling: Secondary | ICD-10-CM | POA: Diagnosis not present

## 2017-08-10 DIAGNOSIS — R531 Weakness: Secondary | ICD-10-CM | POA: Diagnosis not present

## 2017-08-10 DIAGNOSIS — F419 Anxiety disorder, unspecified: Secondary | ICD-10-CM | POA: Diagnosis not present

## 2017-08-10 DIAGNOSIS — R2681 Unsteadiness on feet: Secondary | ICD-10-CM | POA: Diagnosis not present

## 2017-08-11 ENCOUNTER — Ambulatory Visit (INDEPENDENT_AMBULATORY_CARE_PROVIDER_SITE_OTHER): Payer: Medicare PPO | Admitting: Internal Medicine

## 2017-08-11 ENCOUNTER — Encounter: Payer: Self-pay | Admitting: Internal Medicine

## 2017-08-11 VITALS — BP 120/84 | HR 68 | Temp 97.5°F | Wt 167.0 lb

## 2017-08-11 DIAGNOSIS — N390 Urinary tract infection, site not specified: Secondary | ICD-10-CM

## 2017-08-11 DIAGNOSIS — R413 Other amnesia: Secondary | ICD-10-CM | POA: Diagnosis not present

## 2017-08-11 MED ORDER — TRIMETHOPRIM 100 MG PO TABS
100.0000 mg | ORAL_TABLET | Freq: Every day | ORAL | 2 refills | Status: DC
Start: 2017-08-11 — End: 2018-07-02

## 2017-08-11 MED ORDER — TRIMETHOPRIM 100 MG PO TABS: 100.0000 mg | ORAL_TABLET | Freq: Every day | ORAL | 2 refills | Status: DC

## 2017-08-11 NOTE — Progress Notes (Signed)
Subjective:    Patient ID: Tiffany Arnold, female    DOB: [birth year], 76 y.o.   MRN: [id number]  HPI  Pt presents to the clinic today for rehab followup. She was admitted 06/2017 for AMS secondary to UTI. She went to [facility] for 2 weeks after that stay. She has since transitioned to [facility]. She has continued to have issues with her memory. She is being followed by Dr. [name], neurology, for the same. 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 Her lumbar puncture was negative. She has a follow up appt with Dr. [name] in October. Her sister reports she is in [place] for assistance with ADL's. They are trying to transition her to AL. Her sister's biggest concern is a way to prevent UTI's and questions wether they should see urology at this time.   Review of Systems  Past Medical History:  Diagnosis Date  . Allergy   . Aphasia 06/22/2017  . Arthritis   . Asthma   . Cancer ([place]) 07/29/2016   right breast  . Cataract of both eyes   . Chicken pox   . Colon polyps   . Dog bite of index finger 07/23/2016   Left index finger  . GERD (gastroesophageal reflux disease)   . History of radiation therapy 12/24/16- 02/03/17   Right Breast 50.4 Gy in 28 fractions, Right Axilla 45 Gy in 25 fractions.  . Hyperlipidemia   . Hypertension   . IBS (irritable bowel syndrome)   . Memory disorder 06/22/2017  . Phlebitis   . Pneumonia    hx of several yrs ago  . Shortness of breath dyspnea    with exertion only  . Sleep apnea    wears CPAP machine nightly    Current Outpatient Prescriptions  Medication Sig Dispense Refill  . albuterol-ipratropium (COMBIVENT) 18-103 MCG/ACT inhaler Inhale 2 puffs into the lungs every 4 (four) hours as needed for wheezing or shortness of breath.     . ALPRAZolam (XANAX) 0.25 MG tablet TAKE ONE TABLET BY MOUTH TWICE DAILY AS NEEDED (Patient taking differently: TAKE ONE TABLET BY MOUTH TWICE DAILY) 60 tablet 0  . Calcium  Carbonate-Vitamin D (CALCIUM-VITAMIN D) 500-200 MG-UNIT tablet Take 1 tablet by mouth daily.    . cephALEXin (KEFLEX) 500 MG capsule Take 1 capsule (500 mg total) by mouth 2 (two) times daily. 8 capsule 0  . cetirizine (ZYRTEC) 10 MG tablet Take 10 mg by mouth daily.    . Cholecalciferol (VITAMIN D3) 1000 units CAPS Take by mouth 2 (two) times daily.    . diphenoxylate-atropine (LOMOTIL) 2.5-0.025 MG tablet Take 2 tablets by mouth 4 (four) times daily as needed for diarrhea or loose stools. 30 tablet 0  . EPINEPHrine 0.3 mg/0.3 mL IJ SOAJ injection Inject 0.3 mg into the muscle once as needed (ALLERGIC REACTION).     . feeding supplement, ENSURE ENLIVE, (ENSURE ENLIVE) LIQD Take 237 mLs by mouth 3 (three) times daily between meals. 237 mL 12  . glucosamine-chondroitin 500-400 MG tablet Take 1 tablet by mouth 2 (two) times daily.    Marland Kitchen letrozole (FEMARA) 2.5 MG tablet Take 1 tablet (2.5 mg total) by mouth daily. 30 tablet 5  . naproxen sodium (ANAPROX) 220 MG tablet Take 220 mg by mouth 2 (two) times daily with a meal.    . sertraline (ZOLOFT) 25 MG tablet TAKE ONE (1) TABLET EACH DAY 90 tablet 0  . traMADol (ULTRAM) 50 MG  tablet Take 1 tablet (50 mg total) by mouth every 8 (eight) hours as needed. 90 tablet 0  . vitamin B-12 (CYANOCOBALAMIN) 1000 MCG tablet Take 1,000 mcg by mouth daily.    Marland Kitchen trimethoprim (TRIMPEX) 100 MG tablet Take 1 tablet (100 mg total) by mouth daily. 30 tablet 2   No current facility-administered medications for this visit.     Allergies  Allergen Reactions  . Other Swelling and Shortness Of Breath    Cats, dogs, mold Induced asthma Cats, dogs, mold    Family History  Problem Relation Age of Onset  . Arthritis Mother   . Stroke Mother   . Hypertension Mother   . Cancer Father        Prostate  . Stroke Father   . Hypertension Father   . Hypertension Maternal Grandmother   . Rheum arthritis Maternal Grandfather   . Stroke Maternal Grandfather   . Hypertension  Maternal Grandfather   . Cancer Paternal Grandmother        Colon  . Hypertension Paternal Grandmother   . Hypertension Paternal Grandfather   . Breast cancer Neg Hx     Social History   Social History  . Marital status: Married    Spouse name: N/A  . Number of children: 2  . Years of education: 2 years college   Occupational History  . Retired    Social History Main Topics  . Smoking status: Former Smoker    Packs/day: 2.00    Years: 25.00    Quit date: 12/22/1990  . Smokeless tobacco: Never Used     Comment: quit 24 years ago  . Alcohol use Yes     Comment: rare  . Drug use: No  . Sexual activity: Not Currently   Other Topics Concern  . Not on file   Social History Narrative   Lives at home with her husband.   Right-handed.   Occasional caffeine use.     Constitutional: Denies fever, malaise, fatigue, headache or abrupt weight changes.  Gastrointestinal: Denies abdominal pain, bloating, constipation, diarrhea or blood in the stool.  GU: Denies urgency, frequency, pain with urination, burning sensation, blood in urine, odor or discharge. Neurological: Pt reports difficulty with memory. Denies dizziness, difficulty with speech or problems with balance and coordination.    No other specific complaints in a complete review of systems (except as listed in HPI above).     Objective:   Physical Exam   BP 120/84 (BP Location: Left Arm, Patient Position: Sitting, Cuff Size: Normal)   Pulse 68   Temp (!) 97.5 F (36.4 C) (Oral)   Wt 167 lb (75.8 kg)   SpO2 96%   BMI 29.58 kg/m  Wt Readings from Last 3 Encounters:  08/11/17 167 lb (75.8 kg)  07/22/17 171 lb 9.6 oz (77.8 kg)  07/06/17 168 lb 4.8 oz (76.3 kg)    General: Appears her stated age, chronically ill appearing, in NAD. Abdomen: Soft and nontender. Normal bowel sounds. No distention or masses noted.  Musculoskeletal: Gait slow but steady with assistance from rolling walker.  Neurological: She is  confused today.   BMET    Component Value Date/Time   NA 139 07/09/2017 0427   NA 141 04/01/2017 1003   K 3.4 (L) 07/09/2017 0427   K 3.7 04/01/2017 1003   CL 101 07/09/2017 0427   CO2 29 07/09/2017 0427   CO2 25 04/01/2017 1003   GLUCOSE 117 (H) 07/09/2017 0427   GLUCOSE 62 (L)  04/01/2017 1003   BUN 25 (H) 07/09/2017 0427   BUN 18.6 04/01/2017 1003   CREATININE 0.61 07/09/2017 0427   CREATININE 0.7 04/01/2017 1003   CALCIUM 10.3 07/09/2017 0427   CALCIUM 10.4 04/01/2017 1003   GFRNONAA >60 07/09/2017 0427   GFRAA >60 07/09/2017 0427    Lipid Panel     Component Value Date/Time   CHOL 155 03/05/2017 1058   TRIG 94.0 03/05/2017 1058   HDL 45.50 03/05/2017 1058   CHOLHDL 3 03/05/2017 1058   VLDL 18.8 03/05/2017 1058   LDLCALC 91 03/05/2017 1058    CBC    Component Value Date/Time   WBC 8.2 07/22/2017 1512   WBC 9.4 07/07/2017 0419   RBC 3.69 (L) 07/22/2017 1512   RBC 3.58 (L) 07/07/2017 0419   HGB 9.1 (L) 07/22/2017 1512   HCT 30.8 (L) 07/22/2017 1512   PLT 356 07/22/2017 1512   MCV 83.5 07/22/2017 1512   MCH 24.7 (L) 07/22/2017 1512   MCH 24.5 (L) 07/07/2017 0419   MCHC 29.5 (L) 07/22/2017 1512   MCHC 32.0 07/07/2017 0419   RDW 18.1 (H) 07/22/2017 1512   LYMPHSABS 1.9 07/22/2017 1512   MONOABS 0.5 07/22/2017 1512   EOSABS 0.2 07/22/2017 1512   BASOSABS 0.0 07/22/2017 1512    Hgb A1C Lab Results  Component Value Date   HGBA1C 5.5 03/05/2017           Assessment & Plan:   Rehab Follow Up for AMS, UTI:  Hospital notes, labs and procedures reviewed She will continue to follow with Dr. [name] We will start daily Trimethprim to prevent UTI's Will hold off on urologist referral at this time She plans to stay at [facility] indefinitely   [name], sister, will continue to update me with patient's care needed [name], NP

## 2017-08-11 NOTE — Patient Instructions (Signed)

## 2017-08-14 DIAGNOSIS — R4701 Aphasia: Secondary | ICD-10-CM | POA: Diagnosis not present

## 2017-08-14 DIAGNOSIS — R2681 Unsteadiness on feet: Secondary | ICD-10-CM | POA: Diagnosis not present

## 2017-08-14 DIAGNOSIS — F0391 Unspecified dementia with behavioral disturbance: Secondary | ICD-10-CM | POA: Diagnosis not present

## 2017-08-14 DIAGNOSIS — R531 Weakness: Secondary | ICD-10-CM | POA: Diagnosis not present

## 2017-08-14 DIAGNOSIS — I10 Essential (primary) hypertension: Secondary | ICD-10-CM | POA: Diagnosis not present

## 2017-08-14 DIAGNOSIS — Z9181 History of falling: Secondary | ICD-10-CM | POA: Diagnosis not present

## 2017-08-14 DIAGNOSIS — E119 Type 2 diabetes mellitus without complications: Secondary | ICD-10-CM | POA: Diagnosis not present

## 2017-08-14 DIAGNOSIS — F419 Anxiety disorder, unspecified: Secondary | ICD-10-CM | POA: Diagnosis not present

## 2017-08-17 DIAGNOSIS — I10 Essential (primary) hypertension: Secondary | ICD-10-CM | POA: Diagnosis not present

## 2017-08-17 DIAGNOSIS — R531 Weakness: Secondary | ICD-10-CM | POA: Diagnosis not present

## 2017-08-17 DIAGNOSIS — Z9181 History of falling: Secondary | ICD-10-CM | POA: Diagnosis not present

## 2017-08-17 DIAGNOSIS — E119 Type 2 diabetes mellitus without complications: Secondary | ICD-10-CM | POA: Diagnosis not present

## 2017-08-17 DIAGNOSIS — R4701 Aphasia: Secondary | ICD-10-CM | POA: Diagnosis not present

## 2017-08-17 DIAGNOSIS — F419 Anxiety disorder, unspecified: Secondary | ICD-10-CM | POA: Diagnosis not present

## 2017-08-17 DIAGNOSIS — R2681 Unsteadiness on feet: Secondary | ICD-10-CM | POA: Diagnosis not present

## 2017-08-17 DIAGNOSIS — F0391 Unspecified dementia with behavioral disturbance: Secondary | ICD-10-CM | POA: Diagnosis not present

## 2017-08-21 ENCOUNTER — Other Ambulatory Visit: Payer: Medicare PPO

## 2017-08-21 NOTE — Progress Notes (Signed)
   07/08/17 1007  Subjective Data  Patient Stated Goal to go to the bathroom  PT - End of Session  Equipment Utilized During Treatment Gait belt  Activity Tolerance Patient tolerated treatment well  Patient left in chair;with call bell/phone within reach;with chair alarm set;with nursing/sitter in room  Nurse Communication Mobility status  PT - Assessment/Plan  PT Visit Diagnosis Difficulty in walking, not elsewhere classified (R26.2)  PT Frequency (ACUTE ONLY) Min 2X/week  Follow Up Recommendations SNF  AM-PAC PT "6 Clicks" Daily Activity Outcome Measure  Difficulty turning over in bed (including adjusting bedclothes, sheets and blankets)? 3  Difficulty moving from lying on back to sitting on the side of the bed?  3  Difficulty sitting down on and standing up from a chair with arms (e.g., wheelchair, bedside commode, etc,.)? 1  Help needed moving to and from a bed to chair (including a wheelchair)? 3  Help needed walking in hospital room? 3  Help needed climbing 3-5 steps with a railing?  2  6 Click Score 15  Mobility G Code  CK  Acute Rehab PT Goals  PT Goal Formulation With patient  Time For Goal Achievement 07/22/17  Potential to Achieve Goals Fair  PT Time Calculation  PT Start Time (ACUTE ONLY) 0828  PT Stop Time (ACUTE ONLY) 0912  PT Time Calculation (min) (ACUTE ONLY) 44 min  PT G-Codes **NOT FOR INPATIENT CLASS**  Functional Assessment Tool Used AM-PAC 6 Clicks Basic Mobility  Functional Limitation Mobility: Walking and moving around  Mobility: Walking and Moving Around Current Status (M4268) CK  Mobility: Walking and Moving Around Goal Status (T4196) CI  PT General Charges  $$ ACUTE PT VISIT 1 Procedure  PT Evaluation  $PT Eval Low Complexity 1 Procedure  PT Treatments  $Therapeutic Activity 23-37 mins    Late-entry g-codes added after review of initial evaluation/documentation.  Jake Fuhrmann H. Owens Shark, PT, DPT, NCS 08/21/17, 3:26 PM [phone]

## 2017-08-26 DIAGNOSIS — E119 Type 2 diabetes mellitus without complications: Secondary | ICD-10-CM | POA: Diagnosis not present

## 2017-08-26 DIAGNOSIS — R4701 Aphasia: Secondary | ICD-10-CM | POA: Diagnosis not present

## 2017-08-26 DIAGNOSIS — I10 Essential (primary) hypertension: Secondary | ICD-10-CM | POA: Diagnosis not present

## 2017-08-26 DIAGNOSIS — F419 Anxiety disorder, unspecified: Secondary | ICD-10-CM | POA: Diagnosis not present

## 2017-08-26 DIAGNOSIS — R2681 Unsteadiness on feet: Secondary | ICD-10-CM | POA: Diagnosis not present

## 2017-08-26 DIAGNOSIS — R531 Weakness: Secondary | ICD-10-CM | POA: Diagnosis not present

## 2017-08-26 DIAGNOSIS — F0391 Unspecified dementia with behavioral disturbance: Secondary | ICD-10-CM | POA: Diagnosis not present

## 2017-08-26 DIAGNOSIS — Z9181 History of falling: Secondary | ICD-10-CM | POA: Diagnosis not present

## 2017-09-03 DIAGNOSIS — H2513 Age-related nuclear cataract, bilateral: Secondary | ICD-10-CM | POA: Diagnosis not present

## 2017-09-07 ENCOUNTER — Telehealth: Payer: Self-pay

## 2017-09-07 DIAGNOSIS — G4733 Obstructive sleep apnea (adult) (pediatric): Secondary | ICD-10-CM | POA: Diagnosis not present

## 2017-09-07 NOTE — Telephone Encounter (Signed)
Left message on voicemail to see if VO can be given... Asked Baity to give written order to fax to number given below

## 2017-09-07 NOTE — Telephone Encounter (Signed)
Axona with [facility] in [place] left v/m requesting order for U/ A and C/S faxed to [phone] due to confusion, back pain and agitation. Pt last seen 08/11/17.Please advise.

## 2017-09-07 NOTE — Telephone Encounter (Signed)
Order faxed because no on answered as I have called twice and lmovm

## 2017-09-07 NOTE — Telephone Encounter (Signed)
Tiffany with [facility] in [place] left v/m requesting order for U/A. Tiffany request cb.

## 2017-09-07 NOTE — Telephone Encounter (Signed)
Can this not be a verbal order?

## 2017-09-08 ENCOUNTER — Ambulatory Visit
Admission: RE | Admit: 2017-09-08 | Discharge: 2017-09-08 | Disposition: A | Discharge status: Home / Self Care | Payer: Medicare PPO | Source: Ambulatory Visit | Attending: Hematology | Admitting: Hematology

## 2017-09-08 DIAGNOSIS — C50311 Malignant neoplasm of lower-inner quadrant of right female breast: Secondary | ICD-10-CM | POA: Diagnosis not present

## 2017-09-08 DIAGNOSIS — Z170 Estrogen receptor positive status [ER+]: Secondary | ICD-10-CM | POA: Insufficient documentation

## 2017-09-08 DIAGNOSIS — R922 Inconclusive mammogram: Secondary | ICD-10-CM | POA: Diagnosis not present

## 2017-09-09 ENCOUNTER — Encounter: Payer: Self-pay | Admitting: Internal Medicine

## 2017-09-09 ENCOUNTER — Telehealth: Payer: Self-pay

## 2017-09-09 DIAGNOSIS — R399 Unspecified symptoms and signs involving the genitourinary system: Secondary | ICD-10-CM | POA: Diagnosis not present

## 2017-09-09 NOTE — Telephone Encounter (Signed)
Axona with [facility] left v/m requesting printed rx for tramadol (last refilled # 90 on 06/22/17) faxed to [phone]. Last seen 08/11/17. Pt is out of med.

## 2017-09-09 NOTE — Telephone Encounter (Signed)
This can be phoned in, why does she need a printed RX?

## 2017-09-09 NOTE — Telephone Encounter (Signed)
Mel- Can you call [name]. I think it would be best if Mrs. Vogl switched to one of the providers at [place].

## 2017-09-10 NOTE — Telephone Encounter (Signed)
Tiffany Arnold went to [place] and she's calling to speak to Tiffany Arnold can be reached at [phone].

## 2017-09-10 NOTE — Telephone Encounter (Signed)
I spoke to [place] and she reports she went over to [facility] and confirmed with Tiffany nurse there that they received the orders via fax and will forward UA and culture results to you.... She also reports she is looking into to using their Dr service and will give [place] an update

## 2017-09-10 NOTE — Telephone Encounter (Signed)
I spoke to [place] and let her know the difficulty we are having with communicating with [facility].... She states she will look into it and is aware that Rollene Fare thinks it would be best for pt to have care from Dr at facility   I called Nanine Means again no answer and was unable to lmovm because the system hung up on me in the middle of the recorded menu

## 2017-09-16 ENCOUNTER — Other Ambulatory Visit: Payer: Self-pay | Admitting: Internal Medicine

## 2017-09-17 NOTE — Telephone Encounter (Signed)
Rx faxed 2 x yesterday and twice today... [facility] and again there was no answer

## 2017-09-17 NOTE — Telephone Encounter (Signed)
This was done yesterday.  

## 2017-09-17 NOTE — Telephone Encounter (Signed)
Axona with [facility] left v/m requesting urinalysis report that was faxed to [facility] 2 days ago and request provider to review and sign and fax back to [phone].

## 2017-09-18 NOTE — Progress Notes (Signed)
[place]  Telephone:([phone]) [phone] Fax:(336) [phone]  Clinic Follow Up Note   Patient Care Team: [name], NP as PCP - General (Internal Medicine) 09/22/2017   CHIEF COMPLAINTS:  Follow Up right breast cancer  . Oncology History   Breast cancer of lower-inner quadrant of right female breast [facility])   Staging form: Breast, AJCC 7th Edition   - Clinical stage from 06/09/2016: Stage IIB (T2, N1, M0) - Signed by Truitt Merle, MD on 06/27/2016   - Pathologic stage from 07/29/2016: Stage IIA (T1c, N1a, cM0) - Signed by Truitt Merle, MD on 08/13/2016       Breast cancer of lower-inner quadrant of right female breast ([place])   05/23/2016 Mammogram    Diagnostic mammogram and ultrasound showed suspicious architectural distortion within the right breast lower inner quadrant, measuring 1.3 cm, without sonographic corelate.      06/03/2016 Initial Biopsy    Right breast inferior lower quadrant core needle biopsy showed atypical ductal hyperplasia with calcifications      06/09/2016 Receptors her2    Breast biopsy showed ER 100% positive, PR 100% positive, HER-2 negative, Ki-67 40%      06/09/2016 Initial Diagnosis    Breast cancer of lower-inner quadrant of right female breast ([place])      06/09/2016 Initial Biopsy    Right breast inner quadrant core needle biopsy showed invasive ductal carcinoma and DCIS, grade 1-2      06/17/2016 Initial Biopsy    Right axillary lymph node core needle biopsy showed metastatic carcinoma      06/17/2016 Receptors her2    Axillary node biopsy showed ER 100% positive, PR 95% positive, HER-2 negative      06/19/2016 Imaging    Bilateral breast MRI showed locations in the lower inner right breast, largest 2.7X1.3X1.6cm, biopsy clips in 2 of this areas. There are abnormal right axillary lymph nodes showing second cortical's, no evidence of malignancy in the left breast.      07/29/2016 Surgery    Right lumpectomy and ALND      07/29/2016  Pathology Results    Right lumpectomy showed G3 IDC, DCIS, margins (-), LVI(-).  1 of 16 nodes was positive       07/29/2016 Miscellaneous    Mammaprint showed high risk disease, luminal type B      09/09/2016 - 11/12/2016 Adjuvant Chemotherapy    Docetaxel and Cytoxan (TC) every 3 weeks      12/24/2016 - 02/03/2017 Radiation Therapy    Adjuvant breast radiation 12/24/16 - 02/03/17 : Right Breast treated to 50.4 Gy in 28 fractions. Right Axilla treated to 45 Gy in 25 fractions.      02/17/2017 -  Anti-estrogen oral therapy    Letrozole 2.5 mg daily       03/23/2017 Imaging    DG Bone Density 03/23/17 ASSESSMENT: The BMD measured at Femur Neck Right is 0.809 g/cm2 with a T-score of -1.6. This patient is considered osteopenic according to [facility]) criteria.      09/08/2017 Mammogram    Diagnostic Mammogram 09/08/17  IMPRESSION: No mammographic evidence of malignancy in either breast, status post right lumpectomy. RECOMMENDATION: Diagnostic mammogram is suggested in 1 year. (Code:DM-B-01Y)       HISTORY OF PRESENTING ILLNESS:  Tiffany Arnold 76 y.o. female is here because of her recently diagnosed left breast cancer. She presents to my clinic with her friend, who is a Marine scientist.   Her cancer was discovered by screening mammogram.  She had a right breast cyst in 2001, which was removed. She has been doing mammogram once a year. The mammogram and ultrasound on 05/23/2016 showed a suspicious architectural this portion, 1.3 cm, without sonographic correlation. She underwent core needle biopsy of the right breast mass twice and right axilla node biopsy, one breast biopsy and node biopsy showed invasive ductal carcinoma and DCIS, ER/PR strong positive, HER-2 negative.  She denies any other new symptoms. She has noticed mild fatigued lately, she still works full time in a vet's office, she has IBS, has intermittent constipation and diarrhea. She has arthritis, and both knee  replacement and shoulder surgery before, she also has some back pain lately, she takes tylenol occasionally.  She lives with her husband, moderately active. No family history of breast cancer  GYN HISTORY  Menarchal: 13 LMP: 52 Contraceptive: 4-5 years HRT: 3 years  G2P2: no breast feeding, daughter 67 yo and son is 55 yo.   CURRENT THERAPY: Letrozole 2.44m daily since 02/17/17, stopped 09/22/17 and switched to Exemestane to start 10/06/17   INTERIM HISTORY:  Mrs VAstacioreturns for follow up. She present sin the clinic today with her daughter. She notes arthritis in the bottom of her spine and joint pain on each side. She takes prescription medication as needed. Joint pain has worsened in the morning lately.  Pain is 8-10/10. She is able to walk without her walker but she uses it more for her balance. She fell once this week because of her footing and walking too fast. She is tolerating her pain and she may take something for it if offered.  [facility] she hopes to go back home in 2 months. She is currently getting PT there. She notes UTI was found but is now clearing up.  Her short term memory has been slower lately with some confusion. It has been improving some since her hospitalization.    MEDICAL HISTORY:  Past Medical History:  Diagnosis Date  . Allergy   . Aphasia 06/22/2017  . Arthritis   . Asthma   . [place]   right breast  . Cataract of both eyes   . Chicken pox   . Colon polyps   . Dog bite of index finger 07/23/2016   Left index finger  . GERD (gastroesophageal reflux disease)   . History of radiation therapy 12/24/16- 02/03/17   Right Breast 50.4 Gy in 28 fractions, Right Axilla 45 Gy in 25 fractions.  . Hyperlipidemia   . Hypertension   . IBS (irritable bowel syndrome)   . Memory disorder 06/22/2017  . Phlebitis   . Pneumonia    hx of several yrs ago  . Shortness of breath dyspnea    with exertion only  . Sleep apnea     wears CPAP machine nightly    SURGICAL HISTORY: Past Surgical History:  Procedure Laterality Date  . BACK SURGERY     lower  . BREAST CYST EXCISION Right 2001   negative  . BREAST LUMPECTOMY Right 08/29/2016   negative margins  . BREAST LUMPECTOMY WITH NEEDLE LOCALIZATION AND AXILLARY LYMPH NODE DISSECTION Right 07/29/2016   Procedure: RIGHT BREAST LUMPECTOMY WITH DOUBLE NEEDLE LOCALIZATION AND COMPLETE RIGHT AXILLARY LYMPH NODE DISSECTION;  Surgeon: HFanny Skates MD;  [facility]  Service: General;  Laterality: Right;  . BREAST SURGERY Left 2000   Biopsy  . BUNIONECTOMY Bilateral 1998   great toe fusion on right foot  . COLONOSCOPY  W/ POLYPECTOMY    . HERNIA REPAIR  [id number]   [place] SINUS SURGERY  2015  . PORTACATH PLACEMENT N/A 09/05/2016   Procedure: INSERTION PORT-A-CATH;  Surgeon: Fanny Skates, MD;  Location: [facility];  Service: General;  Laterality: N/A;  . REPLACEMENT TOTAL KNEE Bilateral 2007  . TOTAL SHOULDER REPLACEMENT Left 2007    SOCIAL HISTORY: Social History   Social History  . Marital status: Married    Spouse name: N/A  . Number of children: 2  . Years of education: 2 years college   Occupational History  . Retired    Social History Main Topics  . Smoking status: Former Smoker    Packs/day: 2.00    Years: 25.00    Quit date: 12/22/1990  . Smokeless tobacco: Never Used     Comment: quit 24 years ago  . Alcohol use Yes     Comment: rare  . Drug use: No  . Sexual activity: Not Currently   Other Topics Concern  . Not on file   Social History Narrative   Lives at home with her husband.   Right-handed.   Occasional caffeine use.    FAMILY HISTORY: Family History  Problem Relation Age of Onset  . Arthritis Mother   . Stroke Mother   . Hypertension Mother   . Cancer Father        Prostate  . Stroke Father   . Hypertension Father   . Hypertension Maternal Grandmother   . Rheum arthritis Maternal Grandfather   . Stroke  Maternal Grandfather   . Hypertension Maternal Grandfather   . Cancer Paternal Grandmother        Colon  . Hypertension Paternal Grandmother   . Hypertension Paternal Grandfather   . Breast cancer Neg Hx     ALLERGIES:  is allergic to other.  MEDICATIONS:  Current Outpatient Prescriptions  Medication Sig Dispense Refill  . albuterol-ipratropium (COMBIVENT) 18-103 MCG/ACT inhaler Inhale 2 puffs into the lungs every 4 (four) hours as needed for wheezing or shortness of breath.     . ALPRAZolam (XANAX) 0.25 MG tablet TAKE ONE TABLET BY MOUTH TWICE DAILY AS NEEDED (Patient taking differently: TAKE ONE TABLET BY MOUTH TWICE DAILY) 60 tablet 0  . Calcium Carbonate-Vitamin D (CALCIUM-VITAMIN D) 500-200 MG-UNIT tablet Take 1 tablet by mouth daily.    . cephALEXin (KEFLEX) 500 MG capsule Take 1 capsule (500 mg total) by mouth 2 (two) times daily. 8 capsule 0  . cetirizine (ZYRTEC) 10 MG tablet Take 10 mg by mouth daily.    . Cholecalciferol (VITAMIN D3) 1000 units CAPS Take by mouth 2 (two) times daily.    . diphenoxylate-atropine (LOMOTIL) 2.5-0.025 MG tablet Take 2 tablets by mouth 4 (four) times daily as needed for diarrhea or loose stools. 30 tablet 0  . EPINEPHrine 0.3 mg/0.3 mL IJ SOAJ injection Inject 0.3 mg into the muscle once as needed (ALLERGIC REACTION).     . feeding supplement, ENSURE ENLIVE, (ENSURE ENLIVE) LIQD Take 237 mLs by mouth 3 (three) times daily between meals. 237 mL 12  . glucosamine-chondroitin 500-400 MG tablet Take 1 tablet by mouth 2 (two) times daily.    Marland Kitchen letrozole (FEMARA) 2.5 MG tablet Take 1 tablet (2.5 mg total) by mouth daily. 30 tablet 5  . naproxen sodium (ANAPROX) 220 MG tablet Take 220 mg by mouth 2 (two) times daily with a meal.    . sertraline (ZOLOFT) 25 MG tablet TAKE ONE (1) TABLET EACH DAY  90 tablet 0  . traMADol (ULTRAM) 50 MG tablet Take 1 tablet (50 mg total) by mouth every 8 (eight) hours as needed. 90 tablet 0  . trimethoprim (TRIMPEX) 100 MG  tablet Take 1 tablet (100 mg total) by mouth daily. 30 tablet 2  . vitamin B-12 (CYANOCOBALAMIN) 1000 MCG tablet Take 1,000 mcg by mouth daily.    Marland Kitchen exemestane (AROMASIN) 25 MG tablet Take 1 tablet (25 mg total) by mouth daily after breakfast. 30 tablet 1   No current facility-administered medications for this visit.     REVIEW OF SYSTEMS:   Constitutional: Denies fevers, chills or abnormal night sweats (+) falls Eyes: Denies blurriness of vision, double vision or watery eyes Ears, nose, mouth, throat, and face: Denies mucositis or sore throat Respiratory: Denies cough, dyspnea or wheezes Cardiovascular: Denies palpitation, chest discomfort or lower extremity swelling Gastrointestinal:  Denies heartburn or change in bowel habits  Urinary: (+) urinary incontinence, resolving UTI Skin: Denies abnormal skin rashes Lymphatics: Denies new lymphadenopathy or easy bruising.  MSK: (+) Arthritis/joint pain and stiffness in her lower back.  Neurological:Denies numbness, tingling or new weaknesses(+) memory loss Behavioral/Psych: Mood is stable, no new changes  All other systems were reviewed with the patient and are negative.   PHYSICAL EXAMINATION: ECOG PERFORMANCE STATUS: 3  Vitals:   09/22/17 1142  BP: (!) 167/74  Pulse: 61  Resp: 20  Temp: 98 F (36.7 C)  SpO2: 99%   Filed Weights   09/22/17 1142  Weight: 178 lb 3.2 oz (80.8 kg)      GENERAL:alert, no distress and comfortable SKIN: skin color, texture, turgor are normal, no rashes or significant lesions EYES: normal, conjunctiva are pink and non-injected, sclera clear OROPHARYNX:no exudate, no erythema and lips, buccal mucosa, and tongue normal  NECK: supple, thyroid normal size, non-tender, without nodularity LYMPH:  no palpable lymphadenopathy in the cervical, axillary or inguinal (+) Right arm lymphedema LUNGS: clear to auscultation and percussion with normal breathing effort HEART: regular rate & rhythm and no murmurs  and no lower extremity edema ABDOMEN:abdomen soft, non-tender and normal bowel sounds Musculoskeletal:no cyanosis of digits and no clubbing  PSYCH: alert & oriented x 3 with fluent speech NEURO: no focal motor/sensory deficits Breasts: Breast inspection showed them to be symmetrical with no nipple discharge. Palpation of the breasts and axilla revealed no obvious mass that I could appreciate.  LABORATORY DATA:  I have reviewed the data as listed CBC Latest Ref Rng & Units 09/22/2017 07/22/2017 07/07/2017  WBC 3.9 - 10.3 10e3/uL 6.4 8.2 9.4  Hemoglobin 11.6 - 15.9 g/dL 10.3(L) 9.1(L) 8.8(L)  Hematocrit 34.8 - 46.6 % 31.9(L) 30.8(L) 27.4(L)  Platelets 145 - 400 10e3/uL 350 356 419   CMP Latest Ref Rng & Units 09/22/2017 07/09/2017 07/06/2017  Glucose 70 - 140 mg/dl 116 117(H) 141(H)  BUN 7.0 - 26.0 mg/dL 22.4 25(H) 25(H)  Creatinine 0.6 - 1.1 mg/dL 0.8 0.61 0.65  Sodium 136 - 145 mEq/L 140 139 138  Potassium 3.5 - 5.1 mEq/L 3.8 3.4(L) 4.5  Chloride 101 - 111 mmol/L - 101 100(L)  CO2 22 - 29 mEq/L _0 Calcium 8.4 - 10.4 mg/dL 10.0 10.3 10.4(H)  Total Protein 6.4 - 8.3 g/dL 7.1 - 7.4  Total Bilirubin 0.20 - 1.20 mg/dL 0.24 - 1.0  Alkaline Phos 40 - 150 U/L 126 - 83  AST 5 - 34 U/L 14 - 27  ALT 0 - 55 U/L 9 - 12(L)    PATHOLOGY  REPORT  Diagnosis 06/03/2016 Breast, right, needle core biopsy, ILQ focal 1.3 cm asymmetry/distortion - ATYPICAL DUCTAL HYPERPLASIA WITH CALCIFICATIONS. - FIBROCYSTIC CHANGES WITH CALCIFICATIONS. - SEE COMMENT. Microscopic Comment The results were called to [facility] on 06/04/16. (JBK:ds 06/04/16)   Diagnosis 06/09/2016 Breast, right, needle core biopsy, inner - INVASIVE DUCTAL CARCINOMA. - DUCTAL CARCINOMA IN SITU. - SEE COMMENT. Microscopic Comment The carcinoma appears grade 1-2. A breast prognostic profile will be performed and the results reported separately. The results were called to [facility] on  06/10/2016. (JBK:ecj 06/10/2016) Results: HER2 - NEGATIVE RATIO OF HER2/CEP17 SIGNALS 1.55 AVERAGE HER2 COPY NUMBER PER CELL 2.25 Results: IMMUNOHISTOCHEMICAL AND MORPHOMETRIC ANALYSIS PERFORMED MANUALLY Estrogen Receptor: 100%, POSITIVE, STRONG STAINING INTENSITY Progesterone Receptor: 100%, POSITIVE, STRONG STAINING INTENSITY Proliferation Marker Ki67: 40%   Diagnosis 06/17/2016 Lymph node, needle/core biopsy, right, inferior, axilla to far lateral breast - METASTATIC CARCINOMA, SEE COMMENT. Microscopic Comment The morphology is consistent with the patient breast carcinoma. Prognostic markers will be ordered and reported in an addendum. The case was called to [facility] on 06/18/2016. Results: IMMUNOHISTOCHEMICAL AND MORPHOMETRIC ANALYSIS PERFORMED MANUALLY Estrogen Receptor: 100%, POSITIVE, STRONG STAINING INTENSITY Progesterone Receptor: 95%, POSITIVE, STRONG STAINING INTENSITY Results: HER2 - NEGATIVE RATIO OF HER2/CEP17 SIGNALS 1.25 AVERAGE HER2 COPY NUMBER PER CELL 2.00  Diagnosis 07/29/2016 1. Breast, lumpectomy, Right INVASIVE DUCTAL CARCINOMA, GRADE 3, SPANNING 1.5 CM DUCTAL CARCINOMA IN SITU IS PRESENT ALL MARGINS OF RESECTION ARE NEGATIVE FOR CARCINOMA 2. Lymph nodes, regional resection, Right axillary contents METASTATIC BREAST DUCTAL CARCINOMA IN ONE OF SIXTEEN LYMPH NODES (1/16) Specimen, including laterality and lymph node sampling (sentinel, non-sentinel): Right partial breast and regional lymph nodes Procedure: Lumpectomy Histologic type: Ductal carcinoma Grade: 3 Tubule formation: 3 Nuclear pleomorphism: 2 Mitotic:3 Tumor size (gross measurement or glass slide measurement): 1.5 cm Margins: Invasive, distance to closest margin: 0.7 cm In-situ, distance to closest margin: 0.7 cm If margin positive, focally or broadly: NA Lymphovascular invasion: Not identified Ductal carcinoma in situ: Present Grade: 3 Extensive intraductal component:  moderate Lobular neoplasia: Negative Tumor focality: Focal Treatment effect: Negative If present, treatment effect in breast tissue, lymph nodes or both: NA Extent of tumor: Skin: Negative Nipple: Negative Skeletal muscle: Negative Lymph nodes: Examined: 0 Sentinel 16 Non-sentinel 16 Total Lymph nodes with metastasis: 1 Isolated tumor cells (< 0.2 mm): 0 Micrometastasis: (> 0.2 mm and < 2.0 mm): 0 Macrometastasis: (> 2.0 mm): 1 Extracapsular extension: Present Breast prognostic profile: Estrogen receptor: 100% Progesterone receptor: 100% Her 2 neu: Negative Ki-67: 40% Non-neoplastic breast: Unremarkable TNM: pT1c, pN1  RADIOGRAPHIC STUDIES: I have personally reviewed the radiological images as listed and agreed with the findings in the report. Mm Diag Breast Tomo Bilateral  Result Date: 09/08/2017 CLINICAL DATA:  History of treated right breast cancer, status post lumpectomy and radiation therapy in 2017. EXAM: 2D DIGITAL DIAGNOSTIC BILATERAL MAMMOGRAM WITH CAD AND ADJUNCT TOMO COMPARISON:  Previous exam(s). ACR Breast Density Category c: The breast tissue is heterogeneously dense, which may obscure small masses. FINDINGS: Mammographically, there is no evidence of suspicious masses, areas of nonsurgical architectural distortion or microcalcifications in either breast. Post treatment changes in the right breast are seen. This is patient's first postsurgical mammogram and her new baseline. Mammographic images were processed with CAD. IMPRESSION: No mammographic evidence of malignancy in either breast, status post right lumpectomy. RECOMMENDATION: Diagnostic mammogram is suggested in 1 year. (Code:DM-B-01Y) I have discussed the findings and recommendations with the  patient. Results were also provided in writing at the conclusion of the visit. If applicable, a reminder letter will be sent to the patient regarding the next appointment. BI-RADS CATEGORY  2: Benign. Electronically Signed   By:  Fidela Salisbury M.D.   On: 09/08/2017 14:35   Diagnostic mammogram and ultrasound of right breast including right axillary 05/23/2016 IMPRESSION: Suspicious architectural distortion within the lower inner quadrant of the right breast, measuring 1.3 cm greatest dimension, without sonographic correlate. This distortion could conceivably be related to the patient's earlier surgical excision biopsy perform in 2001 (patient states that this earlier surgical excision was in this same region of her right breast), however, exclusion of a neoplastic cause is needed. As such, stereotactic-guided biopsy, with 3D tomosynthesis, is recommended for this suspicious finding.  US Venous Img Upper Uni Right 10/09/2016 IMPRESSION: No evidence of DVT within the right upper extremity.  DG Bone Density 03/23/17 ASSESSMENT: The BMD measured at Femur Neck Right is 0.809 g/cm2 with a T-score of -1.6. This patient is considered osteopenic according to [facility]) criteria.L3was excluded due to degenerative changes. Site Region Measured Measured WHO Young Adult BMD Date       Age      Classification T-score DualFemur Neck Right 5/23/2017 [age] Osteopenia -1.6 0.809 g/cm2  ASSESSMENT & PLAN: 76 y.o.Caucasian female, with mammogram discovered right breast cancer.  1. 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  -We previously reviewed her mammaprint genomic test result, which showed luminal type B, high risk, average 10-year risk of recurrence without adjuvant therapy is 29% -given the high risk disease, she received adjuvant docetaxel and cytoxan every 3 weeks for 4 cycles   -She has completed adjuvant  chemotherapy -Adjuvant radiation. 12/24/16 - 02/03/17 : Right Breast treated to 50.4 Gy in 28 fractions. Right Axilla treated to 45 Gy in 25 fractions. -She started adjuvant letrozole, initially tolerated well. However she has developed significant memory loss, cognitive dysfunction, and intermittent confusion, to the point she needs assistance for her ADLs. Although I do not think this is a common side effect from letrozole, I recommended her to stop letrozole for 2-3 months, to see if her neurological symptoms improves. -she has overall improved, able to do ADLs, complains significant  Arthralgia, I recommend her to change to exemestane. - She will continue continue breast cancer surveillance with screening mammogram, self exam, and routine follow-up with [place] for labs and exam. -I encouraged her to have healthy diet and exercise regularly. -Labs reviewed and shows slight anemia with hg 10.3 and overall her CBC and CMP are with in normal limits.  Exam was unremarkable and her 2018 mammogram was normal. There is no clinical concern for cancer recurrence. -f/u in 4 months    2. Memory loss/cognitive dysfuntion and  intermittent confusion -Starts at about 2 months ago when she had urosepsis twice -She has been seeing a neurologist, All workup including brain MRI and LP has been negative. The CSF was sent for cytology, the result is not available, I have asked our pathology laboratory to track it down.   -Given the negative brain MRI, an overall improving neurological symptoms, I do not have a high suspicion this is leptomeningeal disease. I did track  down her CSF cytology was negative for malignant cells  -Her memory and cognitive function has improved lately   3.  Hyperglycemia -She had borderline hyperglycemia before. She developed steroid-induced hyperglycemia during the chemotherapy -She was on glipizide, developed severe hypoglycemia, has been off now. -She will continue follow-up with her primary  care physician and monitor her blood glucose.  4. Right upper extremity lymphedema -She is quite significant right upper extremity lymphedema after the axillary node dissection -She has completed physical therapy, I encouraged her to continue manual drainage at home, and wear sleeves -She is getting Physical Therapy  5. Hypertension and arthritis -She will continue medication and follow-up with her primary care physician -She has been off blood pressure medication due to her neurologist symptoms and normal blood pressure. She will continue follow-up with her primary care physician  -Due to worsening of joint and back pain I will switch her letrozole to exemestane.   6. Morbid obesity -I encouraged her to have healthy diet and exercise regularly, she has lost some weight lately due to her neurological issues.  7. Bone Health -We discussed how the AI could effect her bones.  -Bone scan 03/23/17. Femur Neck Right T-score -1.6; high risk osteopenic. -continue calcium and vitamin D and PT - I plan to discuss bisphosphonate on her next visit    Plan -Continue to hold Letrozole; ordered Exemestane to start in 2 weeks.  -lab and F/u in 4 months   All questions were answered. The patient knows to call the clinic with any problems, questions or concerns.  I spent 30 minutes counseling the patient face to face. The total time spent in the appointment was 40 minutes and more than 50% was on counseling.    Truitt Merle, MD 09/22/2017   This document serves as a record of services personally performed by Truitt Merle, MD. It was created on her behalf by Joslyn Devon, a trained medical scribe. The creation of this record is based on the scribe's personal observations and the provider's statements to them. This document has been checked and approved by the attending provider.

## 2017-09-18 NOTE — Telephone Encounter (Signed)
I spoke to [employer] at [place] and she states they have received fax for Cipro Rx

## 2017-09-22 ENCOUNTER — Telehealth: Payer: Self-pay

## 2017-09-22 ENCOUNTER — Encounter: Payer: Self-pay | Admitting: Hematology

## 2017-09-22 ENCOUNTER — Other Ambulatory Visit (HOSPITAL_BASED_OUTPATIENT_CLINIC_OR_DEPARTMENT_OTHER): Payer: Medicare PPO

## 2017-09-22 ENCOUNTER — Ambulatory Visit (HOSPITAL_BASED_OUTPATIENT_CLINIC_OR_DEPARTMENT_OTHER): Payer: Medicare PPO | Admitting: Hematology

## 2017-09-22 VITALS — BP 167/74 | HR 61 | Temp 98.0°F | Resp 20 | Ht 63.0 in | Wt 178.2 lb

## 2017-09-22 DIAGNOSIS — E119 Type 2 diabetes mellitus without complications: Secondary | ICD-10-CM

## 2017-09-22 DIAGNOSIS — G4733 Obstructive sleep apnea (adult) (pediatric): Secondary | ICD-10-CM

## 2017-09-22 DIAGNOSIS — R739 Hyperglycemia, unspecified: Secondary | ICD-10-CM | POA: Diagnosis not present

## 2017-09-22 DIAGNOSIS — I10 Essential (primary) hypertension: Secondary | ICD-10-CM

## 2017-09-22 DIAGNOSIS — I890 Lymphedema, not elsewhere classified: Secondary | ICD-10-CM | POA: Diagnosis not present

## 2017-09-22 DIAGNOSIS — Z9989 Dependence on other enabling machines and devices: Secondary | ICD-10-CM

## 2017-09-22 DIAGNOSIS — C50311 Malignant neoplasm of lower-inner quadrant of right female breast: Secondary | ICD-10-CM

## 2017-09-22 DIAGNOSIS — R413 Other amnesia: Secondary | ICD-10-CM | POA: Diagnosis not present

## 2017-09-22 DIAGNOSIS — Z79811 Long term (current) use of aromatase inhibitors: Secondary | ICD-10-CM | POA: Diagnosis not present

## 2017-09-22 DIAGNOSIS — Z170 Estrogen receptor positive status [ER+]: Secondary | ICD-10-CM | POA: Diagnosis not present

## 2017-09-22 LAB — CBC WITH DIFFERENTIAL/PLATELET
BASO%: 0.3 % (ref 0.0–2.0)
Basophils Absolute: 0 10*3/uL (ref 0.0–0.1)
EOS ABS: 0.1 10*3/uL (ref 0.0–0.5)
EOS%: 1.1 % (ref 0.0–7.0)
HCT: 31.9 % — ABNORMAL LOW (ref 34.8–46.6)
HEMOGLOBIN: 10.3 g/dL — AB (ref 11.6–15.9)
LYMPH%: 22.6 % (ref 14.0–49.7)
MCH: 26 pg (ref 25.1–34.0)
MCHC: 32.3 g/dL (ref 31.5–36.0)
MCV: 80.6 fL (ref 79.5–101.0)
MONO#: 0.4 10*3/uL (ref 0.1–0.9)
MONO%: 6.4 % (ref 0.0–14.0)
NEUT%: 69.6 % (ref 38.4–76.8)
NEUTROS ABS: 4.5 10*3/uL (ref 1.5–6.5)
Platelets: 350 10*3/uL (ref 145–400)
RBC: 3.96 10*6/uL (ref 3.70–5.45)
RDW: 20.1 % — AB (ref 11.2–14.5)
WBC: 6.4 10*3/uL (ref 3.9–10.3)
lymph#: 1.5 10*3/uL (ref 0.9–3.3)

## 2017-09-22 LAB — COMPREHENSIVE METABOLIC PANEL
ALBUMIN: 3.1 g/dL — AB (ref 3.5–5.0)
ALK PHOS: 126 U/L (ref 40–150)
ALT: 9 U/L (ref 0–55)
AST: 14 U/L (ref 5–34)
Anion Gap: 7 mEq/L (ref 3–11)
BILIRUBIN TOTAL: 0.24 mg/dL (ref 0.20–1.20)
BUN: 22.4 mg/dL (ref 7.0–26.0)
CO2: 29 mEq/L (ref 22–29)
Calcium: 10 mg/dL (ref 8.4–10.4)
Chloride: 103 mEq/L (ref 98–109)
Creatinine: 0.8 mg/dL (ref 0.6–1.1)
EGFR: 74 mL/min/{1.73_m2} — ABNORMAL LOW (ref >90)
GLUCOSE: 116 mg/dL (ref 70–140)
Potassium: 3.8 mEq/L (ref 3.5–5.1)
Sodium: 140 mEq/L (ref 136–145)
TOTAL PROTEIN: 7.1 g/dL (ref 6.4–8.3)

## 2017-09-22 MED ORDER — EXEMESTANE 25 MG PO TABS: 25.0000 mg | ORAL_TABLET | Freq: Every day | ORAL | 1 refills | Status: DC

## 2017-09-22 NOTE — Telephone Encounter (Signed)
Amy Moon Pt with [facility] left v/m requesting verbal orders for 2 additional visits for [facility] PT. Please advise.

## 2017-09-23 NOTE — Telephone Encounter (Signed)
Ok for HHPT 

## 2017-09-23 NOTE — Telephone Encounter (Signed)
Called but no answer and VM not set up

## 2017-09-24 DIAGNOSIS — I10 Essential (primary) hypertension: Secondary | ICD-10-CM | POA: Diagnosis not present

## 2017-09-24 DIAGNOSIS — R2681 Unsteadiness on feet: Secondary | ICD-10-CM | POA: Diagnosis not present

## 2017-09-24 DIAGNOSIS — R4701 Aphasia: Secondary | ICD-10-CM | POA: Diagnosis not present

## 2017-09-24 DIAGNOSIS — E119 Type 2 diabetes mellitus without complications: Secondary | ICD-10-CM | POA: Diagnosis not present

## 2017-09-24 DIAGNOSIS — F0391 Unspecified dementia with behavioral disturbance: Secondary | ICD-10-CM | POA: Diagnosis not present

## 2017-09-24 DIAGNOSIS — R531 Weakness: Secondary | ICD-10-CM | POA: Diagnosis not present

## 2017-09-24 DIAGNOSIS — F419 Anxiety disorder, unspecified: Secondary | ICD-10-CM | POA: Diagnosis not present

## 2017-09-24 DIAGNOSIS — Z9181 History of falling: Secondary | ICD-10-CM | POA: Diagnosis not present

## 2017-09-24 NOTE — Telephone Encounter (Signed)
VO given to Amy 

## 2017-09-25 ENCOUNTER — Ambulatory Visit (INDEPENDENT_AMBULATORY_CARE_PROVIDER_SITE_OTHER): Payer: Medicare PPO | Admitting: Neurology

## 2017-09-25 ENCOUNTER — Telehealth: Payer: Self-pay | Admitting: *Deleted

## 2017-09-25 ENCOUNTER — Encounter: Payer: Self-pay | Admitting: Neurology

## 2017-09-25 VITALS — BP 198/90 | HR 66 | Ht 63.0 in | Wt 176.5 lb

## 2017-09-25 DIAGNOSIS — R2681 Unsteadiness on feet: Secondary | ICD-10-CM | POA: Diagnosis not present

## 2017-09-25 DIAGNOSIS — I10 Essential (primary) hypertension: Secondary | ICD-10-CM | POA: Diagnosis not present

## 2017-09-25 DIAGNOSIS — R413 Other amnesia: Secondary | ICD-10-CM | POA: Diagnosis not present

## 2017-09-25 DIAGNOSIS — R4701 Aphasia: Secondary | ICD-10-CM | POA: Diagnosis not present

## 2017-09-25 DIAGNOSIS — R3915 Urgency of urination: Secondary | ICD-10-CM | POA: Diagnosis not present

## 2017-09-25 DIAGNOSIS — R531 Weakness: Secondary | ICD-10-CM | POA: Diagnosis not present

## 2017-09-25 DIAGNOSIS — F419 Anxiety disorder, unspecified: Secondary | ICD-10-CM | POA: Diagnosis not present

## 2017-09-25 DIAGNOSIS — F0391 Unspecified dementia with behavioral disturbance: Secondary | ICD-10-CM | POA: Diagnosis not present

## 2017-09-25 DIAGNOSIS — Z9181 History of falling: Secondary | ICD-10-CM | POA: Diagnosis not present

## 2017-09-25 DIAGNOSIS — E119 Type 2 diabetes mellitus without complications: Secondary | ICD-10-CM | POA: Diagnosis not present

## 2017-09-25 NOTE — Progress Notes (Signed)
Reason for visit: Memory disturbance, aphasia  Tiffany Arnold is an 76 y.o. female  History of present illness:  Tiffany Arnold is a 76 year old right-handed white female with a history of altered mental status, and memory problems that developed relatively suddenly around 04/27/2017. The patient had some memory disturbance and word finding problems that predated this about 2 years, but the patient has had a significant change in mental status since May. The patient has had MRI of the brain, she has had an EEG study that did not show any acute changes. The patient has had documented events of low blood sugar into the 22 range. The patient apparently had been on medication to lower the blood sugar while on steroids during chemotherapy, the steroids were discontinued but the medication for blood sugar was not. The patient has also had recurring urinary tract infections, she reports some urinary urgency. Her last bladder infection was approximately one week ago, the infections usually present with confusion. The patient has undergone a lumbar puncture that did not show evidence of carcinomatous meningitis. The patient has apparently improved with her mental status over the last 2 months, she is functioning more independently now.  Past Medical History:  Diagnosis Date  . Allergy   . Aphasia 06/22/2017  . Arthritis   . Asthma   . Cancer ([place]) 07/29/2016   right breast  . Cataract of both eyes   . Chicken pox   . Colon polyps   . Dog bite of index finger 07/23/2016   Left index finger  . GERD (gastroesophageal reflux disease)   . History of radiation therapy 12/24/16- 02/03/17   Right Breast 50.4 Gy in 28 fractions, Right Axilla 45 Gy in 25 fractions.  . Hyperlipidemia   . Hypertension   . IBS (irritable bowel syndrome)   . Memory disorder 06/22/2017  . Phlebitis   . Pneumonia    hx of several yrs ago  . Shortness of breath dyspnea    with exertion only  . Sleep apnea    wears CPAP machine  nightly    Past Surgical History:  Procedure Laterality Date  . BACK SURGERY     lower  . BREAST CYST EXCISION Right 2001   negative  . BREAST LUMPECTOMY Right 08/29/2016   negative margins  . BREAST LUMPECTOMY WITH NEEDLE LOCALIZATION AND AXILLARY LYMPH NODE DISSECTION Right 07/29/2016   Procedure: RIGHT BREAST LUMPECTOMY WITH DOUBLE NEEDLE LOCALIZATION AND COMPLETE RIGHT AXILLARY LYMPH NODE DISSECTION;  Surgeon: Fanny Skates, MD;  Location: [facility];  Service: General;  Laterality: Right;  . BREAST SURGERY Left 2000   Biopsy  . BUNIONECTOMY Bilateral 1998   great toe fusion on right foot  . COLONOSCOPY W/ POLYPECTOMY    . HERNIA REPAIR  [id number]   [place] SINUS SURGERY  2015  . PORTACATH PLACEMENT N/A 09/05/2016   Procedure: INSERTION PORT-A-CATH;  Surgeon: Fanny Skates, MD;  Location: [facility];  Service: General;  Laterality: N/A;  . REPLACEMENT TOTAL KNEE Bilateral 2007  . TOTAL SHOULDER REPLACEMENT Left 2007    Family History  Problem Relation Age of Onset  . Arthritis Mother   . Stroke Mother   . Hypertension Mother   . Cancer Father        Prostate  . Stroke Father   . Hypertension Father   . Hypertension Maternal Grandmother   . Rheum arthritis Maternal Grandfather   . Stroke Maternal Grandfather   . Hypertension Maternal Grandfather   .  Cancer Paternal Grandmother        Colon  . Hypertension Paternal Grandmother   . Hypertension Paternal Grandfather   . Breast cancer Neg Hx     Social history:  reports that she quit smoking about 26 years ago. She has a 50.00 pack-year smoking history. She has never used smokeless tobacco. She reports that she drinks alcohol. She reports that she does not use drugs.    Allergies  Allergen Reactions  . Other Swelling and Shortness Of Breath    Cats, dogs, mold Induced asthma Cats, dogs, mold    Medications:  Prior to Admission medications   Medication Sig Start Date End Date Taking? Authorizing Provider    albuterol-ipratropium (COMBIVENT) 18-103 MCG/ACT inhaler Inhale 2 puffs into the lungs every 4 (four) hours as needed for wheezing or shortness of breath.     [provider]  ALPRAZolam (XANAX) 0.25 MG tablet TAKE ONE TABLET BY MOUTH TWICE DAILY AS NEEDED Patient taking differently: TAKE ONE TABLET BY MOUTH TWICE DAILY 11/26/16   [name], NP  Calcium Carbonate-Vitamin D (CALCIUM-VITAMIN D) 500-200 MG-UNIT tablet Take 1 tablet by mouth daily.    [provider]  cephALEXin (KEFLEX) 500 MG capsule Take 1 capsule (500 mg total) by mouth 2 (two) times daily. 07/09/17   Fritzi Mandes, MD  cetirizine (ZYRTEC) 10 MG tablet Take 10 mg by mouth daily.    [provider]  Cholecalciferol (VITAMIN D3) 1000 units CAPS Take by mouth 2 (two) times daily.    [provider]  diphenoxylate-atropine (LOMOTIL) 2.5-0.025 MG tablet Take 2 tablets by mouth 4 (four) times daily as needed for diarrhea or loose stools. 11/25/16   Susanne Borders, NP  EPINEPHrine 0.3 mg/0.3 mL IJ SOAJ injection Inject 0.3 mg into the muscle once as needed (ALLERGIC REACTION).     [provider]  exemestane (AROMASIN) 25 MG tablet Take 1 tablet (25 mg total) by mouth daily after breakfast. 09/22/17   Truitt Merle, MD  feeding supplement, ENSURE ENLIVE, (ENSURE ENLIVE) LIQD Take 237 mLs by mouth 3 (three) times daily between meals. 07/09/17   Fritzi Mandes, MD  glucosamine-chondroitin 500-400 MG tablet Take 1 tablet by mouth 2 (two) times daily.    [provider]  letrozole (FEMARA) 2.5 MG tablet Take 1 tablet (2.5 mg total) by mouth daily. 04/01/17   Truitt Merle, MD  naproxen sodium (ANAPROX) 220 MG tablet Take 220 mg by mouth 2 (two) times daily with a meal.    [provider]  sertraline (ZOLOFT) 25 MG tablet TAKE ONE (1) TABLET EACH DAY 06/04/17   [name], NP  traMADol (ULTRAM) 50 MG tablet Take 1 tablet (50 mg total) by mouth every 8 (eight) hours as needed. 06/22/17    Tonia Ghent, MD  trimethoprim (TRIMPEX) 100 MG tablet Take 1 tablet (100 mg total) by mouth daily. 08/11/17   [name], NP  vitamin B-12 (CYANOCOBALAMIN) 1000 MCG tablet Take 1,000 mcg by mouth daily.    [provider]    ROS:  Out of a complete 14 system review of symptoms, the patient complains only of the following symptoms, and all other reviewed systems are negative.  Diarrhea Restless legs Frequent urinary tract infections Incontinence of the bladder Back pain Memory loss Anxiety  Blood pressure (!) 198/90, pulse 66, height 5\' 3"  (1.6 m), weight 176 lb 8 oz (80.1 kg).  Physical Exam  General: The patient is alert and cooperative at  the time of the examination.  Skin: No significant peripheral edema is noted.   Neurologic Exam  Mental status: The patient is alert and oriented x 3 at the time of the examination. The Mini-Mental Status Examination done today shows total score 21/30.   Cranial nerves: Facial symmetry is present. Speech is associated with word finding problems, no dysarthria is noted. Extraocular movements are full. Visual fields are full.  Motor: The patient has good strength in all 4 extremities.  Sensory examination: Soft touch sensation is symmetric on the face, arms, and legs.  Coordination: The patient has good finger-nose-finger and heel-to-shin bilaterally.  Gait and station: The patient has a slightly wide-based, unsteady gait. Tandem gait was not attempted. The patient normally walks with a walker. Romberg is negative. No drift is seen.  Reflexes: Deep tendon reflexes are symmetric.   Assessment/Plan:  1. Memory disturbance, aphasia  2. Gait disturbance  The patient has been somewhat better with her mental status according to the family. The patient may have had a pre-existing mild memory problem that was exacerbated by episodes of hypoglycemia. The patient now resides at [facility]. She is doing better there, but she  is still having frequent urinary tract infections. We will set up a referral to urology. The patient be followed over time for her memory, we will consider medication such as Aricept in the future. She will follow-up in 6 months. The blood pressure today appears to be somewhat elevated.  [facility] telephone number [phone]. Fax number is [phone].  Jill Alexanders MD 09/25/2017 1:15 PM  [facility] [address] [place], [place] [address]  Phone [phone] Fax [phone]

## 2017-09-25 NOTE — Telephone Encounter (Signed)
Amy (PT with [facility]) left a voicemail stating that patient is being discharged from her services. Amy stated that patient has primarily met her goals.  Amy stated that there was only one that she did not meet, but is doing well.

## 2017-09-25 NOTE — Telephone Encounter (Signed)
noted 

## 2017-10-07 DIAGNOSIS — G4733 Obstructive sleep apnea (adult) (pediatric): Secondary | ICD-10-CM | POA: Diagnosis not present

## 2017-10-14 DIAGNOSIS — K589 Irritable bowel syndrome without diarrhea: Secondary | ICD-10-CM | POA: Diagnosis not present

## 2017-10-14 DIAGNOSIS — Z515 Encounter for palliative care: Secondary | ICD-10-CM | POA: Diagnosis not present

## 2017-10-14 DIAGNOSIS — J45909 Unspecified asthma, uncomplicated: Secondary | ICD-10-CM | POA: Diagnosis not present

## 2017-10-14 DIAGNOSIS — C50911 Malignant neoplasm of unspecified site of right female breast: Secondary | ICD-10-CM | POA: Diagnosis not present

## 2017-10-14 DIAGNOSIS — I10 Essential (primary) hypertension: Secondary | ICD-10-CM | POA: Diagnosis not present

## 2017-10-14 DIAGNOSIS — G3184 Mild cognitive impairment, so stated: Secondary | ICD-10-CM | POA: Diagnosis not present

## 2017-10-28 DIAGNOSIS — G4733 Obstructive sleep apnea (adult) (pediatric): Secondary | ICD-10-CM | POA: Diagnosis not present

## 2017-10-29 DIAGNOSIS — R7989 Other specified abnormal findings of blood chemistry: Secondary | ICD-10-CM | POA: Diagnosis not present

## 2017-10-29 DIAGNOSIS — D696 Thrombocytopenia, unspecified: Secondary | ICD-10-CM | POA: Diagnosis not present

## 2017-10-29 DIAGNOSIS — I10 Essential (primary) hypertension: Secondary | ICD-10-CM | POA: Diagnosis not present

## 2017-10-29 DIAGNOSIS — D518 Other vitamin B12 deficiency anemias: Secondary | ICD-10-CM | POA: Diagnosis not present

## 2017-10-29 DIAGNOSIS — Z79899 Other long term (current) drug therapy: Secondary | ICD-10-CM | POA: Diagnosis not present

## 2017-10-29 DIAGNOSIS — M1990 Unspecified osteoarthritis, unspecified site: Secondary | ICD-10-CM | POA: Diagnosis not present

## 2017-10-29 DIAGNOSIS — E559 Vitamin D deficiency, unspecified: Secondary | ICD-10-CM | POA: Diagnosis not present

## 2017-10-30 DIAGNOSIS — Z79899 Other long term (current) drug therapy: Secondary | ICD-10-CM | POA: Diagnosis not present

## 2017-11-05 DIAGNOSIS — R748 Abnormal levels of other serum enzymes: Secondary | ICD-10-CM | POA: Diagnosis not present

## 2017-11-05 DIAGNOSIS — Z79899 Other long term (current) drug therapy: Secondary | ICD-10-CM | POA: Diagnosis not present

## 2017-11-05 DIAGNOSIS — R739 Hyperglycemia, unspecified: Secondary | ICD-10-CM | POA: Diagnosis not present

## 2017-11-05 DIAGNOSIS — E785 Hyperlipidemia, unspecified: Secondary | ICD-10-CM | POA: Diagnosis not present

## 2017-11-05 DIAGNOSIS — I10 Essential (primary) hypertension: Secondary | ICD-10-CM | POA: Diagnosis not present

## 2017-11-05 DIAGNOSIS — D519 Vitamin B12 deficiency anemia, unspecified: Secondary | ICD-10-CM | POA: Diagnosis not present

## 2017-11-05 DIAGNOSIS — E8809 Other disorders of plasma-protein metabolism, not elsewhere classified: Secondary | ICD-10-CM | POA: Diagnosis not present

## 2017-11-05 DIAGNOSIS — J069 Acute upper respiratory infection, unspecified: Secondary | ICD-10-CM | POA: Diagnosis not present

## 2017-11-07 DIAGNOSIS — G4733 Obstructive sleep apnea (adult) (pediatric): Secondary | ICD-10-CM | POA: Diagnosis not present

## 2017-11-10 DIAGNOSIS — N3020 Other chronic cystitis without hematuria: Secondary | ICD-10-CM | POA: Diagnosis not present

## 2017-11-10 DIAGNOSIS — D649 Anemia, unspecified: Secondary | ICD-10-CM | POA: Diagnosis not present

## 2017-11-10 DIAGNOSIS — E559 Vitamin D deficiency, unspecified: Secondary | ICD-10-CM | POA: Diagnosis not present

## 2017-11-19 DIAGNOSIS — I10 Essential (primary) hypertension: Secondary | ICD-10-CM | POA: Diagnosis not present

## 2017-11-19 DIAGNOSIS — D508 Other iron deficiency anemias: Secondary | ICD-10-CM | POA: Diagnosis not present

## 2017-11-19 DIAGNOSIS — Z79899 Other long term (current) drug therapy: Secondary | ICD-10-CM | POA: Diagnosis not present

## 2017-11-19 DIAGNOSIS — E669 Obesity, unspecified: Secondary | ICD-10-CM | POA: Diagnosis not present

## 2017-11-19 DIAGNOSIS — N390 Urinary tract infection, site not specified: Secondary | ICD-10-CM | POA: Diagnosis not present

## 2017-11-19 DIAGNOSIS — K5909 Other constipation: Secondary | ICD-10-CM | POA: Diagnosis not present

## 2017-11-19 DIAGNOSIS — F3289 Other specified depressive episodes: Secondary | ICD-10-CM | POA: Diagnosis not present

## 2017-11-19 DIAGNOSIS — M1990 Unspecified osteoarthritis, unspecified site: Secondary | ICD-10-CM | POA: Diagnosis not present

## 2017-11-20 DIAGNOSIS — I10 Essential (primary) hypertension: Secondary | ICD-10-CM | POA: Diagnosis not present

## 2017-11-20 DIAGNOSIS — Z79899 Other long term (current) drug therapy: Secondary | ICD-10-CM | POA: Diagnosis not present

## 2017-11-20 DIAGNOSIS — F3289 Other specified depressive episodes: Secondary | ICD-10-CM | POA: Diagnosis not present

## 2017-11-20 DIAGNOSIS — R739 Hyperglycemia, unspecified: Secondary | ICD-10-CM | POA: Diagnosis not present

## 2017-11-20 DIAGNOSIS — M1990 Unspecified osteoarthritis, unspecified site: Secondary | ICD-10-CM | POA: Diagnosis not present

## 2017-11-26 DIAGNOSIS — Z79899 Other long term (current) drug therapy: Secondary | ICD-10-CM | POA: Diagnosis not present

## 2017-11-26 DIAGNOSIS — G308 Other Alzheimer's disease: Secondary | ICD-10-CM | POA: Diagnosis not present

## 2017-11-26 DIAGNOSIS — E668 Other obesity: Secondary | ICD-10-CM | POA: Diagnosis not present

## 2017-11-26 DIAGNOSIS — C50919 Malignant neoplasm of unspecified site of unspecified female breast: Secondary | ICD-10-CM | POA: Diagnosis not present

## 2017-11-26 DIAGNOSIS — G8929 Other chronic pain: Secondary | ICD-10-CM | POA: Diagnosis not present

## 2017-11-26 DIAGNOSIS — J3089 Other allergic rhinitis: Secondary | ICD-10-CM | POA: Diagnosis not present

## 2017-11-27 ENCOUNTER — Other Ambulatory Visit: Payer: Self-pay

## 2017-11-27 NOTE — Telephone Encounter (Signed)
Last filled 06/22/17 by Dr Damita Dunnings... Please advise... If approved please print so I can fax to pharmacy

## 2017-11-27 NOTE — Telephone Encounter (Signed)
I haven't seen her in months. Did sister [name] decide to switch to a provider on site?

## 2017-12-04 ENCOUNTER — Ambulatory Visit: Payer: Medicare PPO | Admitting: Internal Medicine

## 2017-12-10 DIAGNOSIS — K219 Gastro-esophageal reflux disease without esophagitis: Secondary | ICD-10-CM | POA: Diagnosis not present

## 2017-12-10 DIAGNOSIS — E559 Vitamin D deficiency, unspecified: Secondary | ICD-10-CM | POA: Diagnosis not present

## 2017-12-10 DIAGNOSIS — I10 Essential (primary) hypertension: Secondary | ICD-10-CM | POA: Diagnosis not present

## 2017-12-10 DIAGNOSIS — E668 Other obesity: Secondary | ICD-10-CM | POA: Diagnosis not present

## 2017-12-10 DIAGNOSIS — J45998 Other asthma: Secondary | ICD-10-CM | POA: Diagnosis not present

## 2017-12-10 DIAGNOSIS — Z79899 Other long term (current) drug therapy: Secondary | ICD-10-CM | POA: Diagnosis not present

## 2017-12-11 DIAGNOSIS — D696 Thrombocytopenia, unspecified: Secondary | ICD-10-CM | POA: Diagnosis not present

## 2017-12-11 DIAGNOSIS — E785 Hyperlipidemia, unspecified: Secondary | ICD-10-CM | POA: Diagnosis not present

## 2017-12-11 DIAGNOSIS — R739 Hyperglycemia, unspecified: Secondary | ICD-10-CM | POA: Diagnosis not present

## 2017-12-11 DIAGNOSIS — I10 Essential (primary) hypertension: Secondary | ICD-10-CM | POA: Diagnosis not present

## 2018-01-01 ENCOUNTER — Ambulatory Visit: Payer: Medicare PPO | Admitting: Internal Medicine

## 2018-01-01 ENCOUNTER — Encounter: Payer: Self-pay | Admitting: Internal Medicine

## 2018-01-01 DIAGNOSIS — J302 Other seasonal allergic rhinitis: Secondary | ICD-10-CM | POA: Diagnosis not present

## 2018-01-01 DIAGNOSIS — Z170 Estrogen receptor positive status [ER+]: Secondary | ICD-10-CM

## 2018-01-01 DIAGNOSIS — K219 Gastro-esophageal reflux disease without esophagitis: Secondary | ICD-10-CM | POA: Diagnosis not present

## 2018-01-01 DIAGNOSIS — E7800 Pure hypercholesterolemia, unspecified: Secondary | ICD-10-CM

## 2018-01-01 DIAGNOSIS — C50311 Malignant neoplasm of lower-inner quadrant of right female breast: Secondary | ICD-10-CM | POA: Diagnosis not present

## 2018-01-01 DIAGNOSIS — I10 Essential (primary) hypertension: Secondary | ICD-10-CM

## 2018-01-01 DIAGNOSIS — R413 Other amnesia: Secondary | ICD-10-CM | POA: Diagnosis not present

## 2018-01-01 DIAGNOSIS — K580 Irritable bowel syndrome with diarrhea: Secondary | ICD-10-CM | POA: Diagnosis not present

## 2018-01-01 DIAGNOSIS — G4733 Obstructive sleep apnea (adult) (pediatric): Secondary | ICD-10-CM

## 2018-01-01 DIAGNOSIS — N390 Urinary tract infection, site not specified: Secondary | ICD-10-CM

## 2018-01-01 DIAGNOSIS — E119 Type 2 diabetes mellitus without complications: Secondary | ICD-10-CM | POA: Diagnosis not present

## 2018-01-01 DIAGNOSIS — F411 Generalized anxiety disorder: Secondary | ICD-10-CM | POA: Diagnosis not present

## 2018-01-01 DIAGNOSIS — M47816 Spondylosis without myelopathy or radiculopathy, lumbar region: Secondary | ICD-10-CM

## 2018-01-01 DIAGNOSIS — M1990 Unspecified osteoarthritis, unspecified site: Secondary | ICD-10-CM | POA: Insufficient documentation

## 2018-01-01 DIAGNOSIS — Z9989 Dependence on other enabling machines and devices: Secondary | ICD-10-CM

## 2018-01-01 NOTE — Assessment & Plan Note (Signed)
Improved She is still following with Dr. [name]

## 2018-01-01 NOTE — Assessment & Plan Note (Signed)
Will check A1C yearly Not really considered diabetic anymore Will monitor

## 2018-01-01 NOTE — Assessment & Plan Note (Signed)
Controlled on Trimethoprim She will continue to follow with urology

## 2018-01-01 NOTE — Assessment & Plan Note (Signed)
Continue Tramadol prn 

## 2018-01-01 NOTE — Assessment & Plan Note (Signed)
Encouraged her to consume a low fat diet No meds, will check lipid profile yearly

## 2018-01-01 NOTE — Assessment & Plan Note (Signed)
Chronic but stable on Zoloft Advised her not to use Xanax if she does not need it

## 2018-01-01 NOTE — Assessment & Plan Note (Signed)
Continue Exemestane She will continue to follow with oncology

## 2018-01-01 NOTE — Assessment & Plan Note (Signed)
Reasonable control on Lisinopril Continue for now Discussed DASH diet and exercise for weight loss

## 2018-01-01 NOTE — Assessment & Plan Note (Signed)
Sleeping well with CPAP

## 2018-01-01 NOTE — Assessment & Plan Note (Signed)
No issues on [employer]

## 2018-01-01 NOTE — Assessment & Plan Note (Signed)
Stable on Zyrtec 

## 2018-01-01 NOTE — Progress Notes (Signed)
Subjective:    Patient ID: Tiffany Arnold, female    DOB: 02-09-1941, 77 y.o.   MRN: [id number]  HPI  Pt presents to the clinic today for follow up of chronic conditions. She was at [facility] in July for rehab. She was transitioned to Memory Care at [facility] in August, and discharged home last week because she had improved so much.  Memory Issues: She has had troubles with aphasia and amnesia. She had extensive workup by Dr. [name], note from 09/2017 reviewed. She has had a lot of improvement, almost back to baseline. She has been discharged from [facility] and is now living at home with her husband, foster daughter. She has 2 aides that come help her during the week. She is showering with supervision, dressing, feeding and toileting herself.   Allergies: Worse in the spring and fall. She takes Zyrtec daily with good relief.   OA: Mainly in her back. She takes Tramadol as needed with good relief.  Anxiety: Persistent but stable on Zoloft. She has Xanax but reports she very rarely takes this.  GERD: Currently not an issue. She takes Electronics engineer daily with good relief.  History of Right Breast Cancer: s/p excision and radiation. She is taking Exemestane as prescribed, but is concerned because this is very expensive. She follows with Dr. Burr Medico, note from 09/2017 reviewed.  HLD: Her last LDL was 91, 02/2017. She is not taking any cholesterol lowering medications. She consumes a low fat diet.  HTN: Her BP today is 140/84. She is taking Lisinopril as prescribed. ECG from 06/2017 reviewed.  IBS: Mainly diarrhea. She has explosive diarrhea daily. This is very embarrassing for her. She is taking Lomotil as needed but wonders if there is anything else that may help her.  OSA: She reports she did not sleep well last night, adjusting to being back at home. She is wearing the CPAP nightly. She feels rested when she wakes up.   Recurrent UTI's: She has been following with urology. They did a  cystoscopy and she is emptying her bladder completely. She is taking Trimethoprim as prescribed.  DM 2: Her last A1C was 5.5%, 02/2017. Diet controlled, she is not taking any diabetic medication at this time.  Review of Systems      Past Medical History:  Diagnosis Date  . Allergy   . Aphasia 06/22/2017  . Arthritis   . Asthma   . Cancer ([place]) 07/29/2016   right breast  . Cataract of both eyes   . Chicken pox   . Colon polyps   . Dog bite of index finger 07/23/2016   Left index finger  . GERD (gastroesophageal reflux disease)   . History of radiation therapy 12/24/16- 02/03/17   Right Breast 50.4 Gy in 28 fractions, Right Axilla 45 Gy in 25 fractions.  . Hyperlipidemia   . Hypertension   . IBS (irritable bowel syndrome)   . Memory disorder 06/22/2017  . Phlebitis   . Pneumonia    hx of several yrs ago  . Shortness of breath dyspnea    with exertion only  . Sleep apnea    wears CPAP machine nightly    Current Outpatient Medications  Medication Sig Dispense Refill  . ALPRAZolam (XANAX) 0.25 MG tablet TAKE ONE TABLET BY MOUTH TWICE DAILY AS NEEDED (Patient taking differently: TAKE ONE TABLET BY MOUTH TWICE DAILY) 60 tablet 0  . Calcium Carbonate-Vitamin D (CALCIUM-VITAMIN D) 500-200 MG-UNIT tablet Take 1 tablet by mouth daily.    Marland Kitchen  cephALEXin (KEFLEX) 500 MG capsule Take 1 capsule (500 mg total) by mouth 2 (two) times daily. 8 capsule 0  . cetirizine (ZYRTEC) 10 MG tablet Take 10 mg by mouth daily.    . Cholecalciferol (VITAMIN D3) 1000 units CAPS Take by mouth 2 (two) times daily.    . diphenoxylate-atropine (LOMOTIL) 2.5-0.025 MG tablet Take 2 tablets by mouth 4 (four) times daily as needed for diarrhea or loose stools. 30 tablet 0  . EPINEPHrine 0.3 mg/0.3 mL IJ SOAJ injection Inject 0.3 mg into the muscle once as needed (ALLERGIC REACTION).     Marland Kitchen exemestane (AROMASIN) 25 MG tablet Take 1 tablet (25 mg total) by mouth daily after breakfast. 30 tablet 1  .  glucosamine-chondroitin 500-400 MG tablet Take 1 tablet by mouth 2 (two) times daily.    Marland Kitchen letrozole (FEMARA) 2.5 MG tablet Take 1 tablet (2.5 mg total) by mouth daily. 30 tablet 5  . naproxen sodium (ANAPROX) 220 MG tablet Take 220 mg by mouth 2 (two) times daily with a meal.    . sertraline (ZOLOFT) 25 MG tablet TAKE ONE (1) TABLET EACH DAY 90 tablet 0  . traMADol (ULTRAM) 50 MG tablet Take 1 tablet (50 mg total) by mouth every 8 (eight) hours as needed. 90 tablet 0  . trimethoprim (TRIMPEX) 100 MG tablet Take 1 tablet (100 mg total) by mouth daily. 30 tablet 2  . vitamin B-12 (CYANOCOBALAMIN) 1000 MCG tablet Take 1,000 mcg by mouth daily.     No current facility-administered medications for this visit.     Allergies  Allergen Reactions  . Other Swelling and Shortness Of Breath    Cats, dogs, mold Induced asthma Cats, dogs, mold    Family History  Problem Relation Age of Onset  . Arthritis Mother   . Stroke Mother   . Hypertension Mother   . Cancer Father        Prostate  . Stroke Father   . Hypertension Father   . Hypertension Maternal Grandmother   . Rheum arthritis Maternal Grandfather   . Stroke Maternal Grandfather   . Hypertension Maternal Grandfather   . Cancer Paternal Grandmother        Colon  . Hypertension Paternal Grandmother   . Hypertension Paternal Grandfather   . Breast cancer Neg Hx     Social History   Socioeconomic History  . Marital status: Married    Spouse name: Not on file  . Number of children: 2  . Years of education: 2 years college  . Highest education level: Not on file  Social Needs  . Financial resource strain: Not on file  . Food insecurity - worry: Not on file  . Food insecurity - inability: Not on file  . Transportation needs - medical: Not on file  . Transportation needs - non-medical: Not on file  Occupational History  . Occupation: Retired  Tobacco Use  . Smoking status: Former Smoker    Packs/day: 2.00    Years: 25.00     Pack years: 50.00    Last attempt to quit: 12/22/1990    Years since quitting: 27.0  . Smokeless tobacco: Never Used  . Tobacco comment: quit 24 years ago  Substance and Sexual Activity  . Alcohol use: Yes    Comment: rare  . Drug use: No  . Sexual activity: Not Currently  Other Topics Concern  . Not on file  Social History Narrative   Lives at home with her husband.   Right-handed.  Occasional caffeine use.     Constitutional: Denies fever, malaise, fatigue, headache or abrupt weight changes.  HEENT: Denies eye pain, eye redness, ear pain, ringing in the ears, wax buildup, runny nose, nasal congestion, bloody nose, or sore throat. Respiratory: Denies difficulty breathing, shortness of breath, cough or sputum production.   Cardiovascular: Denies chest pain, chest tightness, palpitations or swelling in the hands or feet.  Gastrointestinal: Pt reports diarrhea. Denies abdominal pain, bloating, constipation, or blood in the stool.  GU: Denies urgency, frequency, pain with urination, burning sensation, blood in urine, odor or discharge. Musculoskeletal: Pt reports low back pain. Denies decrease in range of motion, difficulty with gait, muscle pain or joint swelling.  Skin: Denies redness, rashes, lesions or ulcercations.  Neurological: Pt reports difficulty with memory. Denies dizziness, difficulty with speech or problems with balance and coordination.  Psych: Pt reports anxiety. Denies depression, SI/HI.  No other specific complaints in a complete review of systems (except as listed in HPI above).  Objective:   Physical Exam    BP 140/84   Pulse 66   Temp 97.8 F (36.6 C) (Oral)   Wt 179 lb 8 oz (81.4 kg)   SpO2 94%   BMI 31.80 kg/m  Wt Readings from Last 3 Encounters:  01/01/18 179 lb 8 oz (81.4 kg)  09/25/17 176 lb 8 oz (80.1 kg)  09/22/17 178 lb 3.2 oz (80.8 kg)    General: Appears her stated age, obese in NAD. Neck:  Neck supple, trachea midline. No masses, lumps  or thyromegaly present.  Cardiovascular: Normal rate and rhythm. S1,S2 noted.  Murmur noted. Pulmonary/Chest: Normal effort and positive vesicular breath sounds. No respiratory distress. No wheezes, rales or ronchi noted.  Abdomen: Soft and nontender. Normal bowel sounds. No distention or masses noted. Musculoskeletal: Gait slow and steady with use of rolling walker. Neurological: Alert and oriented. Confused at times but doing much better than prior. Psychiatric: Mood and affect normal. Behavior is normal. Judgment and thought content normal.    BMET    Component Value Date/Time   NA 140 09/22/2017 1003   K 3.8 09/22/2017 1003   CL 101 07/09/2017 0427   CO2 29 09/22/2017 1003   GLUCOSE 116 09/22/2017 1003   BUN 22.4 09/22/2017 1003   CREATININE 0.8 09/22/2017 1003   CALCIUM 10.0 09/22/2017 1003   GFRNONAA >60 07/09/2017 0427   GFRAA >60 07/09/2017 0427    Lipid Panel     Component Value Date/Time   CHOL 155 03/05/2017 1058   TRIG 94.0 03/05/2017 1058   HDL 45.50 03/05/2017 1058   CHOLHDL 3 03/05/2017 1058   VLDL 18.8 03/05/2017 1058   LDLCALC 91 03/05/2017 1058    CBC    Component Value Date/Time   WBC 6.4 09/22/2017 1003   WBC 9.4 07/07/2017 0419   RBC 3.96 09/22/2017 1003   RBC 3.58 (L) 07/07/2017 0419   HGB 10.3 (L) 09/22/2017 1003   HCT 31.9 (L) 09/22/2017 1003   PLT 350 09/22/2017 1003   MCV 80.6 09/22/2017 1003   MCH 26.0 09/22/2017 1003   MCH 24.5 (L) 07/07/2017 0419   MCHC 32.3 09/22/2017 1003   MCHC 32.0 07/07/2017 0419   RDW 20.1 (H) 09/22/2017 1003   LYMPHSABS 1.5 09/22/2017 1003   MONOABS 0.4 09/22/2017 1003   EOSABS 0.1 09/22/2017 1003   BASOSABS 0.0 09/22/2017 1003    Hgb A1C Lab Results  Component Value Date   HGBA1C 5.5 03/05/2017  Assessment & Plan:

## 2018-01-01 NOTE — Patient Instructions (Signed)
DASH Eating Plan DASH stands for "Dietary Approaches to Stop Hypertension." The DASH eating plan is a healthy eating plan that has been shown to reduce high blood pressure (hypertension). It may also reduce your risk for type 2 diabetes, heart disease, and stroke. The DASH eating plan may also help with weight loss. What are tips for following this plan? General guidelines  Avoid eating more than 2,300 mg (milligrams) of salt (sodium) a day. If you have hypertension, you may need to reduce your sodium intake to 1,500 mg a day.  Limit alcohol intake to no more than 1 drink a day for nonpregnant women and 2 drinks a day for men. One drink equals 12 oz of beer, 5 oz of wine, or 1 oz of hard liquor.  Work with your health care provider to maintain a healthy body weight or to lose weight. Ask what an ideal weight is for you.  Get at least 30 minutes of exercise that causes your heart to beat faster (aerobic exercise) most days of the week. Activities may include walking, swimming, or biking.  Work with your health care provider or diet and nutrition specialist (dietitian) to adjust your eating plan to your individual calorie needs. Reading food labels  Check food labels for the amount of sodium per serving. Choose foods with less than 5 percent of the Daily Value of sodium. Generally, foods with less than 300 mg of sodium per serving fit into this eating plan.  To find whole grains, look for the word "whole" as the first word in the ingredient list. Shopping  Buy products labeled as "low-sodium" or "no salt added."  Buy fresh foods. Avoid canned foods and premade or frozen meals. Cooking  Avoid adding salt when cooking. Use salt-free seasonings or herbs instead of table salt or sea salt. Check with your health care provider or pharmacist before using salt substitutes.  Do not fry foods. Cook foods using healthy methods such as baking, boiling, grilling, and broiling instead.  Cook with  heart-healthy oils, such as olive, canola, soybean, or sunflower oil. Meal planning   Eat a balanced diet that includes: ? 5 or more servings of fruits and vegetables each day. At each meal, try to fill half of your plate with fruits and vegetables. ? Up to 6-8 servings of whole grains each day. ? Less than 6 oz of lean meat, poultry, or fish each day. A 3-oz serving of meat is about the same size as a deck of cards. One egg equals 1 oz. ? 2 servings of low-fat dairy each day. ? A serving of nuts, seeds, or beans 5 times each week. ? Heart-healthy fats. Healthy fats called Omega-3 fatty acids are found in foods such as flaxseeds and coldwater fish, like sardines, salmon, and mackerel.  Limit how much you eat of the following: ? Canned or prepackaged foods. ? Food that is high in trans fat, such as fried foods. ? Food that is high in saturated fat, such as fatty meat. ? Sweets, desserts, sugary drinks, and other foods with added sugar. ? Full-fat dairy products.  Do not salt foods before eating.  Try to eat at least 2 vegetarian meals each week.  Eat more home-cooked food and less restaurant, buffet, and fast food.  When eating at a restaurant, ask that your food be prepared with less salt or no salt, if possible. What foods are recommended? The items listed may not be a complete list. Talk with your dietitian about what   dietary choices are best for you. Grains Whole-grain or whole-wheat bread. Whole-grain or whole-wheat pasta. Brown rice. Oatmeal. Quinoa. Bulgur. Whole-grain and low-sodium cereals. Pita bread. Low-fat, low-sodium crackers. Whole-wheat flour tortillas. Vegetables Fresh or frozen vegetables (raw, steamed, roasted, or grilled). Low-sodium or reduced-sodium tomato and vegetable juice. Low-sodium or reduced-sodium tomato sauce and tomato paste. Low-sodium or reduced-sodium canned vegetables. Fruits All fresh, dried, or frozen fruit. Canned fruit in natural juice (without  added sugar). Meat and other protein foods Skinless chicken or turkey. Ground chicken or turkey. Pork with fat trimmed off. Fish and seafood. Egg whites. Dried beans, peas, or lentils. Unsalted nuts, nut butters, and seeds. Unsalted canned beans. Lean cuts of beef with fat trimmed off. Low-sodium, lean deli meat. Dairy Low-fat (1%) or fat-free (skim) milk. Fat-free, low-fat, or reduced-fat cheeses. Nonfat, low-sodium ricotta or cottage cheese. Low-fat or nonfat yogurt. Low-fat, low-sodium cheese. Fats and oils Soft margarine without trans fats. Vegetable oil. Low-fat, reduced-fat, or light mayonnaise and salad dressings (reduced-sodium). Canola, safflower, olive, soybean, and sunflower oils. Avocado. Seasoning and other foods Herbs. Spices. Seasoning mixes without salt. Unsalted popcorn and pretzels. Fat-free sweets. What foods are not recommended? The items listed may not be a complete list. Talk with your dietitian about what dietary choices are best for you. Grains Baked goods made with fat, such as croissants, muffins, or some breads. Dry pasta or rice meal packs. Vegetables Creamed or fried vegetables. Vegetables in a cheese sauce. Regular canned vegetables (not low-sodium or reduced-sodium). Regular canned tomato sauce and paste (not low-sodium or reduced-sodium). Regular tomato and vegetable juice (not low-sodium or reduced-sodium). Pickles. Olives. Fruits Canned fruit in a light or heavy syrup. Fried fruit. Fruit in cream or butter sauce. Meat and other protein foods Fatty cuts of meat. Ribs. Fried meat. Bacon. Sausage. Bologna and other processed lunch meats. Salami. Fatback. Hotdogs. Bratwurst. Salted nuts and seeds. Canned beans with added salt. Canned or smoked fish. Whole eggs or egg yolks. Chicken or turkey with skin. Dairy Whole or 2% milk, cream, and half-and-half. Whole or full-fat cream cheese. Whole-fat or sweetened yogurt. Full-fat cheese. Nondairy creamers. Whipped toppings.  Processed cheese and cheese spreads. Fats and oils Butter. Stick margarine. Lard. Shortening. Ghee. Bacon fat. Tropical oils, such as coconut, palm kernel, or palm oil. Seasoning and other foods Salted popcorn and pretzels. Onion salt, garlic salt, seasoned salt, table salt, and sea salt. Worcestershire sauce. Tartar sauce. Barbecue sauce. Teriyaki sauce. Soy sauce, including reduced-sodium. Steak sauce. Canned and packaged gravies. Fish sauce. Oyster sauce. Cocktail sauce. Horseradish that you find on the shelf. Ketchup. Mustard. Meat flavorings and tenderizers. Bouillon cubes. Hot sauce and Tabasco sauce. Premade or packaged marinades. Premade or packaged taco seasonings. Relishes. Regular salad dressings. Where to find more information:  National Heart, Lung, and [employer] Institute: www.nhlbi.nih.gov  American Heart Association: www.heart.org Summary  The DASH eating plan is a healthy eating plan that has been shown to reduce high blood pressure (hypertension). It may also reduce your risk for type 2 diabetes, heart disease, and stroke.  With the DASH eating plan, you should limit salt (sodium) intake to 2,300 mg a day. If you have hypertension, you may need to reduce your sodium intake to 1,500 mg a day.  When on the DASH eating plan, aim to eat more fresh fruits and vegetables, whole grains, lean proteins, low-fat dairy, and heart-healthy fats.  Work with your health care provider or diet and nutrition specialist (dietitian) to adjust your eating plan to your individual   calorie needs. This information is not intended to replace advice given to you by your health care provider. Make sure you discuss any questions you have with your health care provider. Document Released: 11/27/2011 Document Revised: 12/01/2016 Document Reviewed: 12/01/2016 [employer] Interactive Patient Education  2018 [employer]  

## 2018-01-01 NOTE — Assessment & Plan Note (Signed)
Consider starting Cholecystyramine- they will discuss this with Dr. [name] Discussed [place]

## 2018-01-20 DIAGNOSIS — N281 Cyst of kidney, acquired: Secondary | ICD-10-CM | POA: Diagnosis not present

## 2018-01-20 DIAGNOSIS — N3020 Other chronic cystitis without hematuria: Secondary | ICD-10-CM | POA: Diagnosis not present

## 2018-01-20 NOTE — Progress Notes (Signed)
[facility]  [phone] [phone]  Clinic Follow Up Note   Patient Care Team: [name], NP as PCP - General (Internal Medicine) 01/22/2018   CHIEF COMPLAINTS:  Follow Up right breast cancer  . Oncology History   Breast cancer of lower-inner quadrant of right female breast  [facility])   Staging form: Breast, AJCC 7th Edition   - Clinical stage from 06/09/2016: Stage IIB (T2, N1, M0) - Signed by Truitt Merle, MD on 06/27/2016   - Pathologic stage from 07/29/2016: Stage IIA (T1c, N1a, cM0) - Signed by Truitt Merle, MD on 08/13/2016       Breast cancer of lower-inner quadrant of right female breast ([place])   05/23/2016 Mammogram    Diagnostic mammogram and ultrasound showed suspicious architectural distortion within the right breast lower inner quadrant, measuring 1.3 cm, without sonographic corelate.      06/03/2016 Initial Biopsy    Right breast inferior lower quadrant core needle biopsy showed atypical ductal hyperplasia with calcifications      06/09/2016 Receptors her2    Breast biopsy showed ER 100% positive, PR 100% positive, HER-2 negative, Ki-67 40%      06/09/2016 Initial Diagnosis    Breast cancer of lower-inner quadrant of right female breast ([place])      06/09/2016 Initial Biopsy    Right breast inner quadrant core needle biopsy showed invasive ductal carcinoma and DCIS, grade 1-2      06/17/2016 Initial Biopsy    Right axillary lymph node core needle biopsy showed metastatic carcinoma      06/17/2016 Receptors her2    Axillary node biopsy showed ER 100% positive, PR 95% positive, HER-2 negative      06/19/2016 Imaging    Bilateral breast MRI showed locations in the lower inner right breast, largest 2.7X1.3X1.6cm, biopsy clips in 2 of this areas. There are abnormal right axillary lymph nodes showing second cortical's, no evidence of malignancy in the left breast.      07/29/2016 Surgery    Right lumpectomy and ALND      07/29/2016  Pathology Results    Right lumpectomy showed G3 IDC, DCIS, margins (-), LVI(-).  1 of 16 nodes was positive       07/29/2016 Miscellaneous    Mammaprint showed high risk disease, luminal type B      09/09/2016 - 11/12/2016 Adjuvant Chemotherapy    Docetaxel and Cytoxan (TC) every 3 weeks      12/24/2016 - 02/03/2017 Radiation Therapy    Adjuvant breast radiation 12/24/16 - 02/03/17 : Right Breast treated to 50.4 Gy in 28 fractions. Right Axilla treated to 45 Gy in 25 fractions.      02/17/2017 -  Anti-estrogen oral therapy    Letrozole 2.5 mg daily       03/23/2017 Imaging    DG Bone Density 03/23/17 ASSESSMENT: The BMD measured at Femur Neck Right is 0.809 g/cm2 with a T-score of -1.6. This patient is considered osteopenic according to [facility]) criteria.      09/08/2017 Mammogram    Diagnostic Mammogram 09/08/17  IMPRESSION: No mammographic evidence of malignancy in either breast, status post right lumpectomy. RECOMMENDATION: Diagnostic mammogram is suggested in 1 year. (Code:DM-B-01Y)       HISTORY OF PRESENTING ILLNESS:  Tiffany Arnold 77 y.o. female is here because of her recently diagnosed left breast cancer. She presents to my clinic with her friend, who is a Marine scientist.   Her cancer was discovered by screening mammogram.  She had a right breast cyst in 2001, which was removed. She has been doing mammogram once a year. The mammogram and ultrasound on 05/23/2016 showed a suspicious architectural this portion, 1.3 cm, without sonographic correlation. She underwent core needle biopsy of the right breast mass twice and right axilla node biopsy, one breast biopsy and node biopsy showed invasive ductal carcinoma and DCIS, ER/PR strong positive, HER-2 negative.  She denies any other new symptoms. She has noticed mild fatigued lately, she still works full time in a vet's office, she has IBS, has intermittent constipation and diarrhea. She has arthritis, and both knee  replacement and shoulder surgery before, she also has some back pain lately, she takes tylenol occasionally.  She lives with her husband, moderately active. No family history of breast cancer  GYN HISTORY  Menarchal: 13 LMP: 38 Contraceptive: 4-5 years HRT: 3 years  G2P2: no breast feeding, daughter 75 yo and son is 90 yo.   CURRENT THERAPY: Letrozole 2.109m daily since 02/17/17, stopped 09/22/17 and switched to Exemestane on 10/06/17   INTERIM HISTORY:  Tiffany MCGUINNESSreturns for follow up. She presents in the clinic today with her daughter. She reports she is doing well overall at home with her husband. She does have a caregiver that visits 6 days a week. She is able to complete small household tasks. She is compliant with Exemestane and reports that it is very expensive for her.  She is being followed regularly by a urologist due to recurrent UTIs. She reports some issues with diarrhea and constipation. She has pain in her L4-5 area and has been taking Tramadol for pain control.   On review of systems, pt denies any new pain, or any other complaints at this time. Pertinent positives are listed and detailed within the above HPI.   MEDICAL HISTORY:  Past Medical History:  Diagnosis Date  . Allergy   . Arthritis   . Cancer ([facility] 07/29/2016   right breast  . Cataract of both eyes   . Chicken pox   . Colon polyps   . GERD (gastroesophageal reflux disease)   . Hyperlipidemia   . Hypertension   . IBS (irritable bowel syndrome)   . Memory disorder 06/22/2017  . Sleep apnea    wears CPAP machine nightly    SURGICAL HISTORY: Past Surgical History:  Procedure Laterality Date  . BACK SURGERY     lower  . BREAST CYST EXCISION Right 2001   negative  . BREAST LUMPECTOMY Right 08/29/2016   negative margins  . BREAST LUMPECTOMY WITH NEEDLE LOCALIZATION AND AXILLARY LYMPH NODE DISSECTION Right 07/29/2016   Procedure: RIGHT BREAST LUMPECTOMY WITH DOUBLE NEEDLE LOCALIZATION AND COMPLETE  RIGHT AXILLARY LYMPH NODE DISSECTION;  Surgeon: HFanny Skates MD;  [facility]  Service: General;  Laterality: Right;  . BREAST SURGERY Left 2000   Biopsy  . BUNIONECTOMY Bilateral 1998   great toe fusion on right foot  . COLONOSCOPY W/ POLYPECTOMY    . HERNIA REPAIR  [id number]  [place] [facility]  2015  . PORTACATH PLACEMENT N/A 09/05/2016   Procedure: INSERTION PORT-A-CATH;  Surgeon: HFanny Skates MD;  Location: [facility];  Service: General;  Laterality: N/A;  . REPLACEMENT TOTAL KNEE Bilateral 2007  . TOTAL SHOULDER REPLACEMENT Left 2007    SOCIAL HISTORY: Social History   Socioeconomic History  . Marital status: Married    Spouse name: Not on file  . Number of children: 2  . Years of  education: 2 years college  . Highest education level: Not on file  Social Needs  . Financial resource strain: Not on file  . Food insecurity - worry: Not on file  . Food insecurity - inability: Not on file  . Transportation needs - medical: Not on file  . Transportation needs - non-medical: Not on file  Occupational History  . Occupation: Retired  Tobacco Use  . Smoking status: Former Smoker    Packs/day: 2.00    Years: 25.00    Pack years: 50.00    Last attempt to quit: 12/22/1990    Years since quitting: 27.1  . Smokeless tobacco: Never Used  . Tobacco comment: quit 24 years ago  Substance and Sexual Activity  . Alcohol use: Yes    Comment: rare  . Drug use: No  . Sexual activity: Not Currently  Other Topics Concern  . Not on file  Social History Narrative   Lives at home with her husband.   Right-handed.   Occasional caffeine use.    FAMILY HISTORY: Family History  Problem Relation Age of Onset  . Arthritis Mother   . Stroke Mother   . Hypertension Mother   . Cancer Father        Prostate  . Stroke Father   . Hypertension Father   . Hypertension Maternal Grandmother   . Rheum arthritis Maternal Grandfather   . Stroke Maternal Grandfather   .  Hypertension Maternal Grandfather   . Cancer Paternal Grandmother        Colon  . Hypertension Paternal Grandmother   . Hypertension Paternal Grandfather   . Breast cancer Neg Hx     ALLERGIES:  is allergic to other.  MEDICATIONS:  Current Outpatient Medications  Medication Sig Dispense Refill  . ALPRAZolam (XANAX) 0.25 MG tablet TAKE ONE TABLET BY MOUTH TWICE DAILY AS NEEDED (Patient taking differently: TAKE ONE TABLET BY MOUTH TWICE DAILY) 60 tablet 0  . Calcium Carbonate-Vitamin D (CALCIUM-VITAMIN D) 500-200 MG-UNIT tablet Take 1 tablet by mouth daily.    . diphenoxylate-atropine (LOMOTIL) 2.5-0.025 MG tablet Take 1 tablet by mouth 4 (four) times daily as needed for diarrhea or loose stools. 30 tablet 1  . exemestane (AROMASIN) 25 MG tablet Take 1 tablet (25 mg total) by mouth daily after breakfast. 30 tablet 1  . ferrous sulfate 325 (65 FE) MG tablet Take 325 mg by mouth daily with breakfast.    . Methylcellulose, Laxative, (CITRUCEL) 500 MG TABS Take 2 tablets by mouth 3 (three) times daily.    . sertraline (ZOLOFT) 25 MG tablet TAKE ONE (1) TABLET EACH DAY 90 tablet 0  . traMADol (ULTRAM) 50 MG tablet Take 1 tablet (50 mg total) by mouth every 8 (eight) hours as needed. 90 tablet 0  . trimethoprim (TRIMPEX) 100 MG tablet Take 1 tablet (100 mg total) by mouth daily. 30 tablet 2  . alendronate (FOSAMAX) 70 MG tablet Take 1 tablet (70 mg total) by mouth once a week. Take with a full glass of water on an empty stomach. 4 tablet 5  . Cholecalciferol (VITAMIN D3) 1000 units CAPS Take by mouth 2 (two) times daily.    Marland Kitchen EPINEPHrine 0.3 mg/0.3 mL IJ SOAJ injection Inject 0.3 mg into the muscle once as needed (ALLERGIC REACTION).     . naproxen sodium (ANAPROX) 220 MG tablet Take 220 mg by mouth 2 (two) times daily with a meal.    . nitrofurantoin (MACRODANTIN) 100 MG capsule Take 1 capsule by mouth  daily.     No current facility-administered medications for this visit.     REVIEW OF  SYSTEMS:   Constitutional: Denies fevers, chills or abnormal night sweats (+) falls Eyes: Denies blurriness of vision, double vision or watery eyes Ears, nose, mouth, throat, and face: Denies mucositis or sore throat Respiratory: Denies cough, dyspnea or wheezes Cardiovascular: Denies palpitation, chest discomfort or lower extremity swelling Gastrointestinal:  Denies heartburn (+) diarrhea Urinary: (+) urinary incontinence, resolving UTI Skin: Denies abnormal skin rashes Lymphatics: Denies new lymphadenopathy or easy bruising.  MSK: (+) Arthritis/joint pain and stiffness in her lower back.  Neurological:Denies numbness, tingling or new weaknesses(+) memory loss Behavioral/Psych: Mood is stable, no new changes  All other systems were reviewed with the patient and are negative.   PHYSICAL EXAMINATION: ECOG PERFORMANCE STATUS: 3  Vitals:   01/22/18 1340  BP: 138/61  Pulse: 62  Resp: 17  Temp: 97.9 F (36.6 C)  SpO2: 100%   Filed Weights   01/22/18 1340  Weight: 185 lb 8 oz (84.1 kg)      GENERAL:alert, no distress and comfortable SKIN: skin color, texture, turgor are normal, no rashes or significant lesions EYES: normal, conjunctiva are pink and non-injected, sclera clear OROPHARYNX:no exudate, no erythema and lips, buccal mucosa, and tongue normal  NECK: supple, thyroid normal size, non-tender, without nodularity LYMPH:  no palpable lymphadenopathy in the cervical, axillary or inguinal (+) Right arm lymphedema LUNGS: clear to auscultation and percussion with normal breathing effort HEART: regular rate & rhythm and no murmurs and no lower extremity edema ABDOMEN:abdomen soft, non-tender and normal bowel sounds Musculoskeletal:no cyanosis of digits and no clubbing  PSYCH: alert & oriented x 3 with fluent speech NEURO: no focal motor/sensory deficits Breasts: Breast inspection showed them to be symmetrical with no nipple discharge. Palpation of the breasts and axilla revealed  no obvious mass that I could appreciate.  LABORATORY DATA:  I have reviewed the data as listed CBC Latest Ref Rng & Units 01/22/2018 09/22/2017 07/22/2017  WBC 3.9 - 10.3 K/uL 5.7 6.4 8.2  Hemoglobin 11.6 - 15.9 g/dL 13.2 10.3(L) 9.1(L)  Hematocrit 34.8 - 46.6 % 41.9 31.9(L) 30.8(L)  Platelets 145 - 400 K/uL 199 350 356   CMP Latest Ref Rng & Units 01/22/2018 09/22/2017 07/09/2017  Glucose 70 - 140 mg/dL 121 116 117(H)  BUN 7 - 26 mg/dL 25 22.4 25(H)  Creatinine 0.60 - 1.10 mg/dL 0.92 0.8 0.61  Sodium 136 - 145 mmol/L 139 140 139  Potassium 3.5 - 5.1 mmol/L 5.4(H) 3.8 3.4(L)  Chloride 98 - 109 mmol/L 102 - 101  CO2 22 - 29 mmol/L 31(H) 29 29  Calcium 8.4 - 10.4 mg/dL 9.9 10.0 10.3  Total Protein 6.4 - 8.3 g/dL 7.1 7.1 -  Total Bilirubin 0.2 - 1.2 mg/dL 0.4 0.24 -  Alkaline Phos 40 - 150 U/L 136 126 -  AST 5 - 34 U/L 16 14 -  ALT 0 - 55 U/L 15 9 -    PATHOLOGY REPORT  Diagnosis 06/03/2016 Breast, right, needle core biopsy, ILQ focal 1.3 cm asymmetry/distortion - ATYPICAL DUCTAL HYPERPLASIA WITH CALCIFICATIONS. - FIBROCYSTIC CHANGES WITH CALCIFICATIONS. - SEE COMMENT. Microscopic Comment The results were called to [facility] on 06/04/16. (JBK:ds 06/04/16)   Diagnosis 06/09/2016 Breast, right, needle core biopsy, inner - INVASIVE DUCTAL CARCINOMA. - DUCTAL CARCINOMA IN SITU. - SEE COMMENT. Microscopic Comment The carcinoma appears grade 1-2. A breast prognostic profile will be performed and the results reported separately.  The results were called to [facility] on 06/10/2016. (JBK:ecj 06/10/2016) Results: HER2 - NEGATIVE RATIO OF HER2/CEP17 SIGNALS 1.55 AVERAGE HER2 COPY NUMBER PER CELL 2.25 Results: IMMUNOHISTOCHEMICAL AND MORPHOMETRIC ANALYSIS PERFORMED MANUALLY Estrogen Receptor: 100%, POSITIVE, STRONG STAINING INTENSITY Progesterone Receptor: 100%, POSITIVE, STRONG STAINING INTENSITY Proliferation Marker Ki67: 40%   Diagnosis  06/17/2016 Lymph node, needle/core biopsy, right, inferior, axilla to far lateral breast - METASTATIC CARCINOMA, SEE COMMENT. Microscopic Comment The morphology is consistent with the patient breast carcinoma. Prognostic markers will be ordered and reported in an addendum. The case was called to [facility] on 06/18/2016. Results: IMMUNOHISTOCHEMICAL AND MORPHOMETRIC ANALYSIS PERFORMED MANUALLY Estrogen Receptor: 100%, POSITIVE, STRONG STAINING INTENSITY Progesterone Receptor: 95%, POSITIVE, STRONG STAINING INTENSITY Results: HER2 - NEGATIVE RATIO OF HER2/CEP17 SIGNALS 1.25 AVERAGE HER2 COPY NUMBER PER CELL 2.00  Diagnosis 07/29/2016 1. Breast, lumpectomy, Right INVASIVE DUCTAL CARCINOMA, GRADE 3, SPANNING 1.5 CM DUCTAL CARCINOMA IN SITU IS PRESENT ALL MARGINS OF RESECTION ARE NEGATIVE FOR CARCINOMA 2. Lymph nodes, regional resection, Right axillary contents METASTATIC BREAST DUCTAL CARCINOMA IN ONE OF SIXTEEN LYMPH NODES (1/16) Specimen, including laterality and lymph node sampling (sentinel, non-sentinel): Right partial breast and regional lymph nodes Procedure: Lumpectomy Histologic type: Ductal carcinoma Grade: 3 Tubule formation: 3 Nuclear pleomorphism: 2 Mitotic:3 Tumor size (gross measurement or glass slide measurement): 1.5 cm Margins: Invasive, distance to closest margin: 0.7 cm In-situ, distance to closest margin: 0.7 cm If margin positive, focally or broadly: NA Lymphovascular invasion: Not identified Ductal carcinoma in situ: Present Grade: 3 Extensive intraductal component: moderate Lobular neoplasia: Negative Tumor focality: Focal Treatment effect: Negative If present, treatment effect in breast tissue, lymph nodes or both: NA Extent of tumor: Skin: Negative Nipple: Negative Skeletal muscle: Negative Lymph nodes: Examined: 0 Sentinel 16 Non-sentinel 16 Total Lymph nodes with metastasis: 1 Isolated tumor cells (< 0.2 mm):  0 Micrometastasis: (> 0.2 mm and < 2.0 mm): 0 Macrometastasis: (> 2.0 mm): 1 Extracapsular extension: Present Breast prognostic profile: Estrogen receptor: 100% Progesterone receptor: 100% Her 2 neu: Negative Ki-67: 40% Non-neoplastic breast: Unremarkable TNM: pT1c, pN1  RADIOGRAPHIC STUDIES: I have personally reviewed the radiological images as listed and agreed with the findings in the report. No results found. Diagnostic mammogram and ultrasound of right breast including right axillary 05/23/2016 IMPRESSION: Suspicious architectural distortion within the lower inner quadrant of the right breast, measuring 1.3 cm greatest dimension, without sonographic correlate. This distortion could conceivably be related to the patient's earlier surgical excision biopsy perform in 2001 (patient states that this earlier surgical excision was in this same region of her right breast), however, exclusion of a neoplastic cause is needed. As such, stereotactic-guided biopsy, with 3D tomosynthesis, is recommended for this suspicious finding.  US Venous Img Upper Uni Right 10/09/2016 IMPRESSION: No evidence of DVT within the right upper extremity.  DG Bone Density 03/23/17 ASSESSMENT: The BMD measured at Femur Neck Right is 0.809 g/cm2 with a T-score of -1.6. This patient is considered osteopenic according to [facility]  Diagnostic Mammogram 09/08/17 IMPRESSION: No mammographic evidence of malignancy in either breast, status post right lumpectomy.  ASSESSMENT & PLAN: 77 y.o.Caucasian female, with mammogram discovered right breast cancer.  1. 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  -We previously reviewed her  mammaprint genomic test result, which showed luminal type B, high risk, average 10-year risk of recurrence without adjuvant therapy is 29% -given the high risk disease, she received adjuvant docetaxel and cytoxan every 3 weeks for 4 cycles   -She has completed adjuvant chemotherapy -Adjuvant radiation. 12/24/16 - 02/03/17 : Right Breast treated to 50.4 Gy in 28 fractions. Right Axilla treated to 45 Gy in 25 fractions. -She started adjuvant letrozole, initially tolerated well. However she has developed significant memory loss, cognitive dysfunction, and intermittent confusion, to the point she needs assistance for her ALDs. Although I do not think this is a common side effect from letrozole, I recommended her to stop letrozole for 2-3 months, to see if her neurological symptoms improves. -she has overall improved, able to do ADLs, complains significant  Arthralgia, I recommend her to change to exemestane. - She will continue continue breast cancer surveillance with screening mammogram, self exam, and routine follow-up with [place] for labs and exam. -I again encouraged her to have healthy diet and exercise regularly. -Labs reviewed and overall her CBC and CMP are with in normal limits. Exam was unremarkable and her 2018 mammogram was normal. There is no clinical concern for cancer recurrence. -They have concerns with Exemestane's cost. I offered [facility] financial support system. They are willing to speak to them and if they cannot get assistance, I will switch to Anastrozole.  -f/u in 6 months    2. Memory loss/cognitive dysfuntion and  intermittent confusion -Starts at about 2 months ago when she had urosepsis twice -She has been seeing a neurologist, All workup including brain MRI and LP has been negative. The CSF was sent for cytology, the result is not available, I have asked our pathology laboratory to track it down.   -Given the negative brain MRI, an overall improving neurological symptoms, I do not  have a high suspicion this is leptomeningeal disease. I did track down her CSF cytology was negative for malignant cells  -Her memory and cognitive function has improved lately   3.  Hyperglycemia -She had borderline hyperglycemia before. She developed steroid-induced hyperglycemia during the chemotherapy -She was on glipizide, developed severe hypoglycemia, has been off now. -She will continue follow-up with her primary care physician and monitor her blood glucose.  4. Right upper extremity lymphedema -She is quite significant right upper extremity lymphedema after the axillary node dissection -She has completed physical therapy, I encouraged her to continue manual drainage at home, and wear sleeves -She is getting Physical Therapy  5. Hypertension and arthritis -She will continue medication and follow-up with her primary care physician -She has been off blood pressure medication due to her neurologist symptoms and normal blood pressure. She will continue follow-up with her primary care physician  -Due to worsening of joint and back pain I will switch her letrozole to exemestane.   6. Morbid obesity -I encouraged her to have healthy diet and exercise regularly, she has lost some weight lately due to her neurological issues.  7. Bone Health -We discussed how the AI could effect her bones.  -Bone scan 03/23/17. Femur Neck Right T-score -1.6; high risk osteopenic. -continue calcium and vitamin D -I discussed bisphosphonate today. She does not have any dental problems. They are interested but concerned about price. I will order and they will decide whether or not for her to take it  8. Recurrent UTIs -She has been hospitalized in the past for UTIs and is being  followed by a urologist -She has complaints of urinary leakage and she expressed interest in a hormone cream per her urologist. I explained that since she is on anti-estrogen therapy, which can cause vaginal atrophy and slightly  increased risk of UTI. -I discouraged her to use any estrogen containing vaginal cream.  Plan -Continue Exemestane, she will see finance staff today to see if she can get financial assistance for her high co-pay, if not, I will switch her to Anastrozole -Start Fosamax if affordable -F/u in 6 months with Lacie    All questions were answered. The patient knows to call the clinic with any problems, questions or concerns.  I spent 20 minutes counseling the patient face to face. The total time spent in the appointment was 25 minutes and more than 50% was on counseling.  This document serves as a record of services personally performed by Truitt Merle, MD. It was created on her behalf by Theresia Bough, a trained medical scribe. The creation of this record is based on the scribe's personal observations and the provider's statements to them.   I have reviewed the above documentation for accuracy and completeness, and I agree with the above.     Truitt Merle, MD 01/22/2018 2:24 PM

## 2018-01-21 ENCOUNTER — Telehealth: Payer: Self-pay | Admitting: Internal Medicine

## 2018-01-21 MED ORDER — DIPHENOXYLATE-ATROPINE 2.5-0.025 MG PO TABS: 1.0000 | ORAL_TABLET | Freq: Four times a day (QID) | ORAL | 1 refills | Status: DC | PRN

## 2018-01-21 NOTE — Telephone Encounter (Signed)
Copied from [place] [id number]. Topic: Quick Communication - Rx Refill/Question >> Jan 21, 2018  9:05 AM Yvette Rack wrote: Medication: diphenoxylate-atropine (LOMOTIL) 2.5-0.025 MG tablet   Has the patient contacted their pharmacy? Yes.     (Agent: If no, request that the patient contact the pharmacy for the refill.)   Preferred Pharmacy (with phone number or street name): [facility] number [phone]   Agent: Please be advised that RX refills may take up to 3 business days. We ask that you follow-up with your pharmacy.

## 2018-01-21 NOTE — Telephone Encounter (Signed)
No Rx request in pts chart. Last Rx 2017. Last OV 12/2017

## 2018-01-21 NOTE — Telephone Encounter (Signed)
Caller name:  Elgin,Sharon Relation to pt: sister /  POA  Call back number: [phone]  Pharmacy: [facility] [address] [place], [place], [place] [address] [phone]  Reason for call:  Sister checking on the status of diphenoxylate-atropine (LOMOTIL) 2.5-0.025 MG tablet request due to ongoing diarrhea, please advise

## 2018-01-21 NOTE — Telephone Encounter (Signed)
Lomotil refilled °

## 2018-01-21 NOTE — Addendum Note (Signed)
Addended by: [name] on: 01/21/2018 10:28 AM   Modules accepted: Orders

## 2018-01-22 ENCOUNTER — Telehealth: Payer: Self-pay | Admitting: Hematology

## 2018-01-22 ENCOUNTER — Encounter: Payer: Self-pay | Admitting: Hematology

## 2018-01-22 ENCOUNTER — Inpatient Hospital Stay: Payer: Medicare PPO

## 2018-01-22 ENCOUNTER — Inpatient Hospital Stay: Payer: Medicare PPO | Attending: Hematology | Admitting: Hematology

## 2018-01-22 VITALS — BP 138/61 | HR 62 | Temp 97.9°F | Resp 17 | Ht 63.0 in | Wt 185.5 lb

## 2018-01-22 DIAGNOSIS — R739 Hyperglycemia, unspecified: Secondary | ICD-10-CM | POA: Diagnosis not present

## 2018-01-22 DIAGNOSIS — Z79899 Other long term (current) drug therapy: Secondary | ICD-10-CM

## 2018-01-22 DIAGNOSIS — Z923 Personal history of irradiation: Secondary | ICD-10-CM | POA: Diagnosis not present

## 2018-01-22 DIAGNOSIS — Z8042 Family history of malignant neoplasm of prostate: Secondary | ICD-10-CM | POA: Diagnosis not present

## 2018-01-22 DIAGNOSIS — Z9989 Dependence on other enabling machines and devices: Secondary | ICD-10-CM

## 2018-01-22 DIAGNOSIS — I890 Lymphedema, not elsewhere classified: Secondary | ICD-10-CM | POA: Diagnosis not present

## 2018-01-22 DIAGNOSIS — I10 Essential (primary) hypertension: Secondary | ICD-10-CM | POA: Diagnosis not present

## 2018-01-22 DIAGNOSIS — E119 Type 2 diabetes mellitus without complications: Secondary | ICD-10-CM

## 2018-01-22 DIAGNOSIS — C50311 Malignant neoplasm of lower-inner quadrant of right female breast: Secondary | ICD-10-CM | POA: Diagnosis not present

## 2018-01-22 DIAGNOSIS — Z87891 Personal history of nicotine dependence: Secondary | ICD-10-CM | POA: Diagnosis not present

## 2018-01-22 DIAGNOSIS — M549 Dorsalgia, unspecified: Secondary | ICD-10-CM | POA: Diagnosis not present

## 2018-01-22 DIAGNOSIS — R634 Abnormal weight loss: Secondary | ICD-10-CM | POA: Diagnosis not present

## 2018-01-22 DIAGNOSIS — G4733 Obstructive sleep apnea (adult) (pediatric): Secondary | ICD-10-CM

## 2018-01-22 DIAGNOSIS — Z170 Estrogen receptor positive status [ER+]: Principal | ICD-10-CM

## 2018-01-22 DIAGNOSIS — C773 Secondary and unspecified malignant neoplasm of axilla and upper limb lymph nodes: Secondary | ICD-10-CM | POA: Diagnosis not present

## 2018-01-22 DIAGNOSIS — Z800 Family history of malignant neoplasm of digestive organs: Secondary | ICD-10-CM

## 2018-01-22 DIAGNOSIS — Z79811 Long term (current) use of aromatase inhibitors: Secondary | ICD-10-CM | POA: Diagnosis not present

## 2018-01-22 DIAGNOSIS — K589 Irritable bowel syndrome without diarrhea: Secondary | ICD-10-CM | POA: Diagnosis not present

## 2018-01-22 LAB — CBC WITH DIFFERENTIAL/PLATELET
BASOS ABS: 0 10*3/uL (ref 0.0–0.1)
Basophils Relative: 0 %
Eosinophils Absolute: 0.1 10*3/uL (ref 0.0–0.5)
Eosinophils Relative: 2 %
HCT: 41.9 % (ref 34.8–46.6)
HEMOGLOBIN: 13.2 g/dL (ref 11.6–15.9)
LYMPHS PCT: 32 %
Lymphs Abs: 1.8 10*3/uL (ref 0.9–3.3)
MCH: 28.4 pg (ref 25.1–34.0)
MCHC: 31.5 g/dL (ref 31.5–36.0)
MCV: 90.3 fL (ref 79.5–101.0)
MONO ABS: 0.4 10*3/uL (ref 0.1–0.9)
Monocytes Relative: 6 %
NEUTROS PCT: 60 %
Neutro Abs: 3.4 10*3/uL (ref 1.5–6.5)
Platelets: 199 10*3/uL (ref 145–400)
RBC: 4.64 MIL/uL (ref 3.70–5.45)
RDW: 15.2 % — AB (ref 11.2–14.5)
WBC: 5.7 10*3/uL (ref 3.9–10.3)

## 2018-01-22 LAB — COMPREHENSIVE METABOLIC PANEL
ALBUMIN: 3.5 g/dL (ref 3.5–5.0)
ALT: 15 U/L (ref 0–55)
ANION GAP: 6 (ref 3–11)
AST: 16 U/L (ref 5–34)
Alkaline Phosphatase: 136 U/L (ref 40–150)
BILIRUBIN TOTAL: 0.4 mg/dL (ref 0.2–1.2)
BUN: 25 mg/dL (ref 7–26)
CO2: 31 mmol/L — AB (ref 22–29)
Calcium: 9.9 mg/dL (ref 8.4–10.4)
Chloride: 102 mmol/L (ref 98–109)
Creatinine, Ser: 0.92 mg/dL (ref 0.60–1.10)
GFR calc non Af Amer: 60 mL/min — ABNORMAL LOW (ref >60)
GLUCOSE: 121 mg/dL (ref 70–140)
POTASSIUM: 5.4 mmol/L — AB (ref 3.5–5.1)
SODIUM: 139 mmol/L (ref 136–145)
TOTAL PROTEIN: 7.1 g/dL (ref 6.4–8.3)

## 2018-01-22 MED ORDER — ALENDRONATE SODIUM 70 MG PO TABS: 70.0000 mg | ORAL_TABLET | ORAL | 5 refills | Status: DC

## 2018-01-22 NOTE — Telephone Encounter (Signed)
Gave avs and calendar for august  °

## 2018-01-23 ENCOUNTER — Encounter: Payer: Self-pay | Admitting: Hematology

## 2018-02-09 DIAGNOSIS — G4733 Obstructive sleep apnea (adult) (pediatric): Secondary | ICD-10-CM | POA: Diagnosis not present

## 2018-02-12 ENCOUNTER — Encounter: Payer: Self-pay | Admitting: Family Medicine

## 2018-02-12 ENCOUNTER — Ambulatory Visit: Payer: Self-pay

## 2018-02-12 ENCOUNTER — Ambulatory Visit: Payer: Medicare PPO | Admitting: Family Medicine

## 2018-02-12 VITALS — BP 116/68 | HR 81 | Temp 97.9°F | Ht 63.0 in | Wt 181.5 lb

## 2018-02-12 DIAGNOSIS — N390 Urinary tract infection, site not specified: Secondary | ICD-10-CM | POA: Diagnosis not present

## 2018-02-12 LAB — POC URINALSYSI DIPSTICK (AUTOMATED)
Bilirubin, UA: NEGATIVE
Glucose, UA: NEGATIVE
KETONES UA: NEGATIVE
Nitrite, UA: NEGATIVE
PH UA: 6 (ref 5.0–8.0)
PROTEIN UA: NEGATIVE
RBC UA: NEGATIVE
Urobilinogen, UA: 0.2 E.U./dL

## 2018-02-12 MED ORDER — CEPHALEXIN 250 MG PO CAPS: 250.0000 mg | ORAL_CAPSULE | Freq: Two times a day (BID) | ORAL | 0 refills | Status: DC

## 2018-02-12 NOTE — Progress Notes (Signed)
Subjective:    Patient ID: Tiffany Arnold, female    DOB: [birth year], 77 y.o.   MRN: [id number]  HPI 77 yo pt of NP Baity here for urinary symptoms  She is a breast cancer survivor   A tiny bit of discomfort with urination  No more frequency than usual/ but has more urgency  No incontinence  No blood in urine  Some back pain - but this is chronic/no more than usual -takes tramadol prn   She has had severe utis before-and wanted to get ahead of this   No n/v or fever  More fatigued the past few days / blah Cognition is not as good either (not confusion full blown)    ua shows small leukocytes  Results for orders placed or performed in visit on 02/12/18  POCT Urinalysis Dipstick (Automated)  Result Value Ref Range   Color, UA Dark Yellow    Clarity, UA Hazy    Glucose, UA Negative    Bilirubin, UA Negative    Ketones, UA Negative    Spec Grav, UA >=1.030 (A) 1.010 - 1.025   Blood, UA Negative    pH, UA 6.0 5.0 - 8.0   Protein, UA Negative    Urobilinogen, UA 0.2 0.2 or 1.0 E.U./dL   Nitrite, UA Negative    Leukocytes, UA Small (1+) (A) Negative   it is also concentrated  Usually drinks a lot of water (and some cranberry juice)  Had not had much this am    Hx of recurrent uti for which she takes daily trimethoprim Sees urology     Patient Active Problem List   Diagnosis Date Noted  . Recurrent UTI 01/01/2018  . Osteoarthritis 01/01/2018  . Memory disorder 06/22/2017  . Osteopenia 04/01/2017  . Port catheter in place 12/25/2016  . IBS (irritable bowel syndrome) 11/25/2016  . Breast cancer of lower-inner quadrant of right female breast ([place]) 06/27/2016  . OSA on CPAP 06/26/2016  . Type 2 diabetes mellitus without complication ([place]) 27/02/5008  . Essential hypertension 09/04/2014  . HLD (hyperlipidemia) 09/04/2014  . Gastroesophageal reflux disease without esophagitis 09/04/2014  . Seasonal allergies 09/04/2014  . Generalized anxiety disorder 09/04/2014    Past Medical History:  Diagnosis Date  . Allergy   . Arthritis   . Cancer ([place]) 07/29/2016   right breast  . Cataract of both eyes   . Chicken pox   . Colon polyps   . GERD (gastroesophageal reflux disease)   . Hyperlipidemia   . Hypertension   . IBS (irritable bowel syndrome)   . Memory disorder 06/22/2017  . Sleep apnea    wears CPAP machine nightly   Past Surgical History:  Procedure Laterality Date  . BACK SURGERY     lower  . BREAST CYST EXCISION Right 2001   negative  . BREAST LUMPECTOMY Right 08/29/2016   negative margins  . BREAST LUMPECTOMY WITH NEEDLE LOCALIZATION AND AXILLARY LYMPH NODE DISSECTION Right 07/29/2016   Procedure: RIGHT BREAST LUMPECTOMY WITH DOUBLE NEEDLE LOCALIZATION AND COMPLETE RIGHT AXILLARY LYMPH NODE DISSECTION;  Surgeon: Fanny Skates, MD;  Location: [facility];  Service: General;  Laterality: Right;  . BREAST SURGERY Left 2000   Biopsy  . BUNIONECTOMY Bilateral 1998   great toe fusion on right foot  . COLONOSCOPY W/ POLYPECTOMY    . HERNIA REPAIR  [id number]   [place] SINUS SURGERY  2015  . PORTACATH PLACEMENT N/A 09/05/2016   Procedure: INSERTION PORT-A-CATH;  Surgeon: Renelda Loma  Dalbert Batman, MD;  Location: [facility];  Service: General;  Laterality: N/A;  . REPLACEMENT TOTAL KNEE Bilateral 2007  . TOTAL SHOULDER REPLACEMENT Left 2007   Social History   Tobacco Use  . Smoking status: Former Smoker    Packs/day: 2.00    Years: 25.00    Pack years: 50.00    Last attempt to quit: 12/22/1990    Years since quitting: 27.1  . Smokeless tobacco: Never Used  . Tobacco comment: quit 24 years ago  Substance Use Topics  . Alcohol use: Yes    Comment: rare  . Drug use: No   Family History  Problem Relation Age of Onset  . Arthritis Mother   . Stroke Mother   . Hypertension Mother   . Cancer Father        Prostate  . Stroke Father   . Hypertension Father   . Hypertension Maternal Grandmother   . Rheum arthritis Maternal Grandfather   . Stroke  Maternal Grandfather   . Hypertension Maternal Grandfather   . Cancer Paternal Grandmother        Colon  . Hypertension Paternal Grandmother   . Hypertension Paternal Grandfather   . Breast cancer Neg Hx    Allergies  Allergen Reactions  . Other Swelling and Shortness Of Breath    Cats, dogs, mold Induced asthma Cats, dogs, mold   Current Outpatient Medications on File Prior to Visit  Medication Sig Dispense Refill  . alendronate (FOSAMAX) 70 MG tablet Take 1 tablet (70 mg total) by mouth once a week. Take with a full glass of water on an empty stomach. 4 tablet 5  . ALPRAZolam (XANAX) 0.25 MG tablet TAKE ONE TABLET BY MOUTH TWICE DAILY AS NEEDED (Patient taking differently: TAKE ONE TABLET BY MOUTH TWICE DAILY) 60 tablet 0  . Calcium Carbonate-Vitamin D (CALCIUM-VITAMIN D) 500-200 MG-UNIT tablet Take 1 tablet by mouth daily.    . Cholecalciferol (VITAMIN D3) 1000 units CAPS Take by mouth 2 (two) times daily.    . diphenoxylate-atropine (LOMOTIL) 2.5-0.025 MG tablet Take 1 tablet by mouth 4 (four) times daily as needed for diarrhea or loose stools. 30 tablet 1  . EPINEPHrine 0.3 mg/0.3 mL IJ SOAJ injection Inject 0.3 mg into the muscle once as needed (ALLERGIC REACTION).     Marland Kitchen exemestane (AROMASIN) 25 MG tablet Take 1 tablet (25 mg total) by mouth daily after breakfast. 30 tablet 1  . ferrous sulfate 325 (65 FE) MG tablet Take 325 mg by mouth daily with breakfast.    . Methylcellulose, Laxative, (CITRUCEL) 500 MG TABS Take 2 tablets by mouth 3 (three) times daily.    . naproxen sodium (ANAPROX) 220 MG tablet Take 220 mg by mouth 2 (two) times daily with a meal.    . nitrofurantoin (MACRODANTIN) 100 MG capsule Take 1 capsule by mouth daily.    . sertraline (ZOLOFT) 25 MG tablet TAKE ONE (1) TABLET EACH DAY 90 tablet 0  . traMADol (ULTRAM) 50 MG tablet Take 1 tablet (50 mg total) by mouth every 8 (eight) hours as needed. 90 tablet 0  . trimethoprim (TRIMPEX) 100 MG tablet Take 1 tablet  (100 mg total) by mouth daily. 30 tablet 2   No current facility-administered medications on file prior to visit.     Review of Systems  Constitutional: Positive for fatigue. Negative for activity change, appetite change and fever.  HENT: Negative for congestion and sore throat.   Eyes: Negative for itching and visual disturbance.  Respiratory:  Negative for cough and shortness of breath.   Cardiovascular: Negative for leg swelling.  Gastrointestinal: Negative for abdominal distention, abdominal pain, constipation, diarrhea and nausea.  Endocrine: Negative for cold intolerance and polydipsia.  Genitourinary: Positive for dysuria and urgency. Negative for difficulty urinating, flank pain, frequency, hematuria and vaginal discharge.  Musculoskeletal: Negative for myalgias.  Skin: Negative for rash.  Allergic/Immunologic: Negative for immunocompromised state.  Neurological: Negative for dizziness and weakness.  Hematological: Negative for adenopathy.  Psychiatric/Behavioral: Positive for decreased concentration.       Objective:   Physical Exam  Constitutional: She appears well-developed and well-nourished. No distress.  obese and well appearing   HENT:  Head: Normocephalic and atraumatic.  Eyes: Conjunctivae and EOM are normal. Pupils are equal, round, and reactive to light.  Neck: Normal range of motion. Neck supple.  Cardiovascular: Normal rate, regular rhythm and normal heart sounds.  Pulmonary/Chest: Effort normal and breath sounds normal.  Abdominal: Soft. Bowel sounds are normal. She exhibits no distension. There is tenderness. There is no rebound.  No cva tenderness  Mild suprapubic tenderness  Musculoskeletal: She exhibits no edema.  Lymphadenopathy:    She has no cervical adenopathy.  Neurological: She is alert.  Skin: No rash noted.  Psychiatric: She has a normal mood and affect.          Assessment & Plan:   Problem List Items Addressed This Visit       Genitourinary   Recurrent UTI - Primary    With hx of severe uti  Seeing urology-on trimethoprim chronically for prophylaxis  Mildly pos ua  Cover with keflex Pend cx Disc avoiding most drinks but water  Also other bladder irritants  Will update with cx result      Relevant Medications   cephALEXin (KEFLEX) 250 MG capsule   Other Relevant Orders   POCT Urinalysis Dipstick (Automated) (Completed)   Urine Culture (Completed)

## 2018-02-12 NOTE — Patient Instructions (Addendum)
Aim for 64 oz fluids per day- mostly water   Avoid too much coffee or tea  Avoid artificial sweetener   Take the keflex (cephalexin) as directed   We will culture your urine and contact you with a result  In the meantime if symptoms worsen - call and alert [place]

## 2018-02-12 NOTE — Telephone Encounter (Signed)
Pt. Having painful urination and frequency x 5-7 days.Has not seen any blood in urine. States is on Macrobid 1 daily for prophylaxis. Appointment made for today.  Reason for Disposition . Urinating more frequently than usual (i.e., frequency)  Answer Assessment - Initial Assessment Questions 1. SYMPTOM: "What's the main symptom you're concerned about?" (e.g., frequency, incontinence)     Frequency and pain 2. ONSET: "When did the  ________  start?"     Started 1 week ago 3. PAIN: "Is there any pain?" If so, ask: "How bad is it?" (Scale: 1-10; mild, moderate, severe)      Moderate 4. CAUSE: "What do you think is causing the symptoms?"     UTI 5. OTHER SYMPTOMS: "Do you have any other symptoms?" (e.g., fever, flank pain, blood in urine, pain with urination)     Pain with urination 6. PREGNANCY: "Is there any chance you are pregnant?" "When was your last menstrual period?"     No  Protocols used: URINARY [facility]

## 2018-02-13 LAB — URINE CULTURE
MICRO NUMBER:: 90236618
Result:: NO GROWTH
SPECIMEN QUALITY:: ADEQUATE

## 2018-02-14 NOTE — Assessment & Plan Note (Signed)
With hx of severe uti  Seeing urology-on trimethoprim chronically for prophylaxis  Mildly pos ua  Cover with keflex Pend cx Disc avoiding most drinks but water  Also other bladder irritants  Will update with cx result

## 2018-02-17 ENCOUNTER — Telehealth: Payer: Self-pay | Admitting: Internal Medicine

## 2018-02-17 NOTE — Telephone Encounter (Signed)
Tiffany Arnold came in today checking to see if the urine results are back Best number [phone]

## 2018-02-17 NOTE — Telephone Encounter (Signed)
I sent mychart result when it returned -perhaps she does not check them?  Urine culture was negative  Do f/u if her symptoms are not better

## 2018-02-17 NOTE — Telephone Encounter (Signed)
Pt's sister Dahlia Byes notified of Dr. Marliss Coots comments off of mychart and verbalized understanding

## 2018-04-05 ENCOUNTER — Ambulatory Visit: Payer: Medicare PPO | Admitting: Neurology

## 2018-04-05 ENCOUNTER — Encounter: Payer: Self-pay | Admitting: Neurology

## 2018-04-05 VITALS — BP 180/88 | HR 54 | Ht 63.0 in | Wt 185.5 lb

## 2018-04-05 DIAGNOSIS — R413 Other amnesia: Secondary | ICD-10-CM

## 2018-04-05 NOTE — Progress Notes (Signed)
Reason for visit: Memory disturbance  Tiffany Arnold is an 77 y.o. female  History of present illness:  Tiffany Arnold is a 77 year old right-handed white female with a history of a memory disturbance.  The patient had a significant change in her memory around 27 Apr 2017 secondary to an episode of hypoglycemia.  The patient has had episodes of confusion off and on when she gets bladder infections, she has had 2 or 3-year history of mild memory problems.  The patient now has returned home from [place].  She has been functioning fairly well.  She has not worsened with her memory over the last 6 months.  The patient now is on a daily antibiotic to prevent bladder infections.  She has been seen through urology.  Past Medical History:  Diagnosis Date  . Allergy   . Arthritis   . Cancer ([place]) 07/29/2016   right breast  . Cataract of both eyes   . Chicken pox   . Colon polyps   . GERD (gastroesophageal reflux disease)   . Hyperlipidemia   . Hypertension   . IBS (irritable bowel syndrome)   . Memory disorder 06/22/2017  . Sleep apnea    wears CPAP machine nightly    Past Surgical History:  Procedure Laterality Date  . BACK SURGERY     lower  . BREAST CYST EXCISION Right 2001   negative  . BREAST LUMPECTOMY Right 08/29/2016   negative margins  . BREAST LUMPECTOMY WITH NEEDLE LOCALIZATION AND AXILLARY LYMPH NODE DISSECTION Right 07/29/2016   Procedure: RIGHT BREAST LUMPECTOMY WITH DOUBLE NEEDLE LOCALIZATION AND COMPLETE RIGHT AXILLARY LYMPH NODE DISSECTION;  Surgeon: Fanny Skates, MD;  Location: [facility];  Service: General;  Laterality: Right;  . BREAST SURGERY Left 2000   Biopsy  . BUNIONECTOMY Bilateral 1998   great toe fusion on right foot  . COLONOSCOPY W/ POLYPECTOMY    . HERNIA REPAIR  [id number]   [place] SINUS SURGERY  2015  . PORTACATH PLACEMENT N/A 09/05/2016   Procedure: INSERTION PORT-A-CATH;  Surgeon: Fanny Skates, MD;  [facility];  Service: General;   Laterality: N/A;  . REPLACEMENT TOTAL KNEE Bilateral 2007  . TOTAL SHOULDER REPLACEMENT Left 2007    Family History  Problem Relation Age of Onset  . Arthritis Mother   . Stroke Mother   . Hypertension Mother   . Cancer Father        Prostate  . Stroke Father   . Hypertension Father   . Hypertension Maternal Grandmother   . Rheum arthritis Maternal Grandfather   . Stroke Maternal Grandfather   . Hypertension Maternal Grandfather   . Cancer Paternal Grandmother        Colon  . Hypertension Paternal Grandmother   . Hypertension Paternal Grandfather   . Breast cancer Neg Hx     Social history:  reports that she quit smoking about 27 years ago. She has a 50.00 pack-year smoking history. She has never used smokeless tobacco. She reports that she drinks alcohol. She reports that she does not use drugs.    Allergies  Allergen Reactions  . Other Swelling and Shortness Of Breath    Cats, dogs, mold Induced asthma Cats, dogs, mold    Medications:  Prior to Admission medications   Medication Sig Start Date End Date Taking? Authorizing Provider  alendronate (FOSAMAX) 70 MG tablet Take 1 tablet (70 mg total) by mouth once a week. Take with a full glass of water  on an empty stomach. 01/22/18  Yes Truitt Merle, MD  Calcium Carbonate-Vitamin D (CALCIUM-VITAMIN D) 500-200 MG-UNIT tablet Take 1 tablet by mouth daily.   Yes [provider]  exemestane (AROMASIN) 25 MG tablet Take 1 tablet (25 mg total) by mouth daily after breakfast. 09/22/17  Yes Truitt Merle, MD  Methylcellulose, Laxative, (CITRUCEL) 500 MG TABS Take 2 tablets by mouth 3 (three) times daily.   Yes [provider]  nitrofurantoin (MACRODANTIN) 100 MG capsule Take 1 capsule by mouth daily. 01/21/18  Yes [provider]  Probiotic Product (PROBIOTIC PO) Take 1 Dose by mouth daily.   Yes [provider]  sertraline (ZOLOFT) 25 MG tablet TAKE ONE (1) TABLET EACH DAY 06/04/17  Yes Baity, Coralie Keens, NP    ALPRAZolam Duanne Moron) 0.25 MG tablet TAKE ONE TABLET BY MOUTH TWICE DAILY AS NEEDED Patient not taking: Reported on 04/05/2018 11/26/16   [name], NP  diphenoxylate-atropine (LOMOTIL) 2.5-0.025 MG tablet Take 1 tablet by mouth 4 (four) times daily as needed for diarrhea or loose stools. Patient not taking: Reported on 04/05/2018 01/21/18   [name], NP  EPINEPHrine 0.3 mg/0.3 mL IJ SOAJ injection Inject 0.3 mg into the muscle once as needed (ALLERGIC REACTION).     [provider]  ferrous sulfate 325 (65 FE) MG tablet Take 325 mg by mouth daily with breakfast.    [provider]  traMADol (ULTRAM) 50 MG tablet Take 1 tablet (50 mg total) by mouth every 8 (eight) hours as needed. Patient not taking: Reported on 04/05/2018 06/22/17   Tonia Ghent, MD  trimethoprim (TRIMPEX) 100 MG tablet Take 1 tablet (100 mg total) by mouth daily. Patient not taking: Reported on 04/05/2018 08/11/17   [name], NP    ROS:  Out of a complete 14 system review of symptoms, the patient complains only of the following symptoms, and all other reviewed systems are negative.  Memory loss, confusion  Blood pressure (!) 180/88, pulse (!) 54, height 5\' 3"  (1.6 m), weight 185 lb 8 oz (84.1 kg).  Physical Exam  General: The patient is alert and cooperative at the time of the examination.  The patient is moderately obese.  Skin: No significant peripheral edema is noted.   Neurologic Exam  Mental status: The patient is alert and oriented x 3 at the time of the examination. The Mini-Mental status examination done today shows a total score 22/30.   Cranial nerves: Facial symmetry is present. Speech is normal, no aphasia or dysarthria is noted. Extraocular movements are full. Visual fields are full.  Motor: The patient has good strength in all 4 extremities.  Sensory examination: Soft touch sensation is symmetric on the face, arms, and legs.  Coordination: The patient has good  finger-nose-finger and heel-to-shin bilaterally.  Gait and station: The patient has a slightly wide-based gait, the patient normally walks with a walker.  Tandem gait was not attempted.  Romberg is negative.  Reflexes: Deep tendon reflexes are symmetric.   Assessment/Plan:  1.  Memory disturbance  The patient has had good stability with her memory since last seen.  She does get confused when she gets bladder infections.  She does have a baseline dementia.  We discussed the possibility of going on Namenda, the patient is not sure she wants to start the medication currently.  She does have episodes of diarrhea, Aricept or Exelon may exacerbate this.  She will follow-up in 6 months.  Jill Alexanders MD  04/05/2018 2:59 PM  [facility] [address] [place], [place] [address]  Phone [phone] Fax [phone]

## 2018-05-24 ENCOUNTER — Other Ambulatory Visit: Payer: Self-pay | Admitting: Internal Medicine

## 2018-05-25 ENCOUNTER — Other Ambulatory Visit: Payer: Self-pay | Admitting: Internal Medicine

## 2018-06-02 DIAGNOSIS — G4733 Obstructive sleep apnea (adult) (pediatric): Secondary | ICD-10-CM | POA: Diagnosis not present

## 2018-06-17 ENCOUNTER — Other Ambulatory Visit: Payer: Self-pay | Admitting: Hematology

## 2018-07-02 ENCOUNTER — Ambulatory Visit (INDEPENDENT_AMBULATORY_CARE_PROVIDER_SITE_OTHER): Payer: Medicare PPO | Admitting: Internal Medicine

## 2018-07-02 ENCOUNTER — Encounter: Payer: Self-pay | Admitting: Internal Medicine

## 2018-07-02 VITALS — BP 132/80 | HR 62 | Temp 98.3°F | Ht 60.0 in | Wt 200.0 lb

## 2018-07-02 DIAGNOSIS — M47816 Spondylosis without myelopathy or radiculopathy, lumbar region: Secondary | ICD-10-CM | POA: Diagnosis not present

## 2018-07-02 DIAGNOSIS — K580 Irritable bowel syndrome with diarrhea: Secondary | ICD-10-CM

## 2018-07-02 DIAGNOSIS — I10 Essential (primary) hypertension: Secondary | ICD-10-CM

## 2018-07-02 DIAGNOSIS — C50311 Malignant neoplasm of lower-inner quadrant of right female breast: Secondary | ICD-10-CM

## 2018-07-02 DIAGNOSIS — N390 Urinary tract infection, site not specified: Secondary | ICD-10-CM

## 2018-07-02 DIAGNOSIS — R413 Other amnesia: Secondary | ICD-10-CM | POA: Diagnosis not present

## 2018-07-02 DIAGNOSIS — E119 Type 2 diabetes mellitus without complications: Secondary | ICD-10-CM

## 2018-07-02 DIAGNOSIS — F411 Generalized anxiety disorder: Secondary | ICD-10-CM | POA: Diagnosis not present

## 2018-07-02 DIAGNOSIS — M810 Age-related osteoporosis without current pathological fracture: Secondary | ICD-10-CM | POA: Diagnosis not present

## 2018-07-02 DIAGNOSIS — K219 Gastro-esophageal reflux disease without esophagitis: Secondary | ICD-10-CM

## 2018-07-02 DIAGNOSIS — D508 Other iron deficiency anemias: Secondary | ICD-10-CM

## 2018-07-02 DIAGNOSIS — Z9989 Dependence on other enabling machines and devices: Secondary | ICD-10-CM

## 2018-07-02 DIAGNOSIS — E7800 Pure hypercholesterolemia, unspecified: Secondary | ICD-10-CM

## 2018-07-02 DIAGNOSIS — G4733 Obstructive sleep apnea (adult) (pediatric): Secondary | ICD-10-CM

## 2018-07-02 DIAGNOSIS — Z0000 Encounter for general adult medical examination without abnormal findings: Secondary | ICD-10-CM

## 2018-07-02 DIAGNOSIS — Z170 Estrogen receptor positive status [ER+]: Secondary | ICD-10-CM

## 2018-07-02 MED ORDER — DIPHENOXYLATE-ATROPINE 2.5-0.025 MG PO TABS: 1.0000 | ORAL_TABLET | Freq: Four times a day (QID) | ORAL | 5 refills | Status: DC | PRN

## 2018-07-02 NOTE — Patient Instructions (Signed)
Health Maintenance for Postmenopausal Women Menopause is a normal process in which your reproductive ability comes to an end. This process happens gradually over a span of months to years, usually between the ages of 22 and 9. Menopause is complete when you have missed 12 consecutive menstrual periods. It is important to talk with your health care provider about some of the most common conditions that affect postmenopausal women, such as heart disease, cancer, and bone loss (osteoporosis). Adopting a healthy lifestyle and getting preventive care can help to promote your health and wellness. Those actions can also lower your chances of developing some of these common conditions. What should I know about menopause? During menopause, you may experience a number of symptoms, such as:  Moderate-to-severe hot flashes.  Night sweats.  Decrease in sex drive.  Mood swings.  Headaches.  Tiredness.  Irritability.  Memory problems.  Insomnia.  Choosing to treat or not to treat menopausal changes is an individual decision that you make with your health care provider. What should I know about hormone replacement therapy and supplements? Hormone therapy products are effective for treating symptoms that are associated with menopause, such as hot flashes and night sweats. Hormone replacement carries certain risks, especially as you become older. If you are thinking about using estrogen or estrogen with progestin treatments, discuss the benefits and risks with your health care provider. What should I know about heart disease and stroke? Heart disease, heart attack, and stroke become more likely as you age. This may be due, in part, to the hormonal changes that your body experiences during menopause. These can affect how your body processes dietary fats, triglycerides, and cholesterol. Heart attack and stroke are both medical emergencies. There are many things that you can do to help prevent heart disease  and stroke:  Have your blood pressure checked at least every 1-2 years. High blood pressure causes heart disease and increases the risk of stroke.  If you are 53-22 years old, ask your health care provider if you should take aspirin to prevent a heart attack or a stroke.  Do not use any tobacco products, including cigarettes, chewing tobacco, or electronic cigarettes. If you need help quitting, ask your health care provider.  It is important to eat a healthy diet and maintain a healthy weight. ? Be sure to include plenty of vegetables, fruits, low-fat dairy products, and lean protein. ? Avoid eating foods that are high in solid fats, added sugars, or salt (sodium).  Get regular exercise. This is one of the most important things that you can do for your health. ? Try to exercise for at least 150 minutes each week. The type of exercise that you do should increase your heart rate and make you sweat. This is known as moderate-intensity exercise. ? Try to do strengthening exercises at least twice each week. Do these in addition to the moderate-intensity exercise.  Know your numbers.Ask your health care provider to check your cholesterol and your blood glucose. Continue to have your blood tested as directed by your health care provider.  What should I know about cancer screening? There are several types of cancer. Take the following steps to reduce your risk and to catch any cancer development as early as possible. Breast Cancer  Practice breast self-awareness. ? This means understanding how your breasts normally appear and feel. ? It also means doing regular breast self-exams. Let your health care provider know about any changes, no matter how small.  If you are 40  or older, have a clinician do a breast exam (clinical breast exam or CBE) every year. Depending on your age, family history, and medical history, it may be recommended that you also have a yearly breast X-ray (mammogram).  If you  have a family history of breast cancer, talk with your health care provider about genetic screening.  If you are at high risk for breast cancer, talk with your health care provider about having an MRI and a mammogram every year.  Breast cancer (BRCA) gene test is recommended for women who have family members with BRCA-related cancers. Results of the assessment will determine the need for genetic counseling and BRCA1 and for BRCA2 testing. BRCA-related cancers include these types: ? Breast. This occurs in males or females. ? Ovarian. ? Tubal. This may also be called fallopian tube cancer. ? Cancer of the abdominal or pelvic lining (peritoneal cancer). ? Prostate. ? Pancreatic.  Cervical, Uterine, and Ovarian Cancer Your health care provider may recommend that you be screened regularly for cancer of the pelvic organs. These include your ovaries, uterus, and vagina. This screening involves a pelvic exam, which includes checking for microscopic changes to the surface of your cervix (Pap test).  For women ages 21-65, health care providers may recommend a pelvic exam and a Pap test every three years. For women ages 79-65, they may recommend the Pap test and pelvic exam, combined with testing for human papilloma virus (HPV), every five years. Some types of HPV increase your risk of cervical cancer. Testing for HPV may also be done on women of any age who have unclear Pap test results.  Other health care providers may not recommend any screening for nonpregnant women who are considered low risk for pelvic cancer and have no symptoms. Ask your health care provider if a screening pelvic exam is right for you.  If you have had past treatment for cervical cancer or a condition that could lead to cancer, you need Pap tests and screening for cancer for at least 20 years after your treatment. If Pap tests have been discontinued for you, your risk factors (such as having a new sexual partner) need to be  reassessed to determine if you should start having screenings again. Some women have medical problems that increase the chance of getting cervical cancer. In these cases, your health care provider may recommend that you have screening and Pap tests more often.  If you have a family history of uterine cancer or ovarian cancer, talk with your health care provider about genetic screening.  If you have vaginal bleeding after reaching menopause, tell your health care provider.  There are currently no reliable tests available to screen for ovarian cancer.  Lung Cancer Lung cancer screening is recommended for adults 69-62 years old who are at high risk for lung cancer because of a history of smoking. A yearly low-dose CT scan of the lungs is recommended if you:  Currently smoke.  Have a history of at least 30 pack-years of smoking and you currently smoke or have quit within the past 15 years. A pack-year is smoking an average of one pack of cigarettes per day for one year.  Yearly screening should:  Continue until it has been 15 years since you quit.  Stop if you develop a health problem that would prevent you from having lung cancer treatment.  Colorectal Cancer  This type of cancer can be detected and can often be prevented.  Routine colorectal cancer screening usually begins at  age 42 and continues through age 45.  If you have risk factors for colon cancer, your health care provider may recommend that you be screened at an earlier age.  If you have a family history of colorectal cancer, talk with your health care provider about genetic screening.  Your health care provider may also recommend using home test kits to check for hidden blood in your stool.  A small camera at the end of a tube can be used to examine your colon directly (sigmoidoscopy or colonoscopy). This is done to check for the earliest forms of colorectal cancer.  Direct examination of the colon should be repeated every  5-10 years until age 71. However, if early forms of precancerous polyps or small growths are found or if you have a family history or genetic risk for colorectal cancer, you may need to be screened more often.  Skin Cancer  Check your skin from head to toe regularly.  Monitor any moles. Be sure to tell your health care provider: ? About any new moles or changes in moles, especially if there is a change in a mole's shape or color. ? If you have a mole that is larger than the size of a pencil eraser.  If any of your family members has a history of skin cancer, especially at a young age, talk with your health care provider about genetic screening.  Always use sunscreen. Apply sunscreen liberally and repeatedly throughout the day.  Whenever you are outside, protect yourself by wearing long sleeves, pants, a wide-brimmed hat, and sunglasses.  What should I know about osteoporosis? Osteoporosis is a condition in which bone destruction happens more quickly than new bone creation. After menopause, you may be at an increased risk for osteoporosis. To help prevent osteoporosis or the bone fractures that can happen because of osteoporosis, the following is recommended:  If you are 46-71 years old, get at least 1,000 mg of calcium and at least 600 mg of vitamin D per day.  If you are older than age 55 but younger than age 65, get at least 1,200 mg of calcium and at least 600 mg of vitamin D per day.  If you are older than age 54, get at least 1,200 mg of calcium and at least 800 mg of vitamin D per day.  Smoking and excessive alcohol intake increase the risk of osteoporosis. Eat foods that are rich in calcium and vitamin D, and do weight-bearing exercises several times each week as directed by your health care provider. What should I know about how menopause affects my mental health? Depression may occur at any age, but it is more common as you become older. Common symptoms of depression  include:  Low or sad mood.  Changes in sleep patterns.  Changes in appetite or eating patterns.  Feeling an overall lack of motivation or enjoyment of activities that you previously enjoyed.  Frequent crying spells.  Talk with your health care provider if you think that you are experiencing depression. What should I know about immunizations? It is important that you get and maintain your immunizations. These include:  Tetanus, diphtheria, and pertussis (Tdap) booster vaccine.  Influenza every year before the flu season begins.  Pneumonia vaccine.  Shingles vaccine.  Your health care provider may also recommend other immunizations. This information is not intended to replace advice given to you by your health care provider. Make sure you discuss any questions you have with your health care provider. Document Released: 01/30/2006  Document Revised: 06/27/2016 Document Reviewed: 09/11/2015 Elsevier Interactive Patient Education  2018 [employer]  

## 2018-07-02 NOTE — Progress Notes (Signed)
HPI:  Pt presents to the clinic today for her Medicare Wellness Exam. She is also due to follow up chronic conditions.  DM 2: Her last A1C was 5.5%, 02/2017. She is currently not taking any diabetic medications at this time. She does not routinely check her sugars. She checks her feet daily.  GERD: No issues off meds. There is no UGI on file.  Arthritis: Mainly in her shoulders, lower spine. She takes Tramadol as needed with good relief.  History of Breast Cancer: s/p excision, chemo and radiation. She is taking Exemestane as prescribed. She follows with oncology.  Iron Deficiency Anemia: Her last H/H was 13.2/41.9, 01/2018. She takes Ferrous Sulfate 325 mg daily. She follows with Dr. Burr Medico.  HLD: Her last LDL was 91, 02/2017. She is not taking any cholesterol lowering medication at this time. She tries to consume a low fat diet.  HTN: Her BP today is 132/80 . She is taking Lisinopril as prescribed. ECG from 06/2017 reviewed.  IBS: Mainly diarrhea. She takes Lomotil as needed with good relief. She would like a refill of this today.  MCI: She has some expressive aphasia. She is not currently taking any medications. She follows with Dr. [name].   Anxiety: Stable on Sertraline and Xanax. She takes Xanax very sparingly.  OSA: She no longer wears her CPAP. She is averaging 7-8 hours of sleep per night. She feels rested when she wakes up. She often takes naps during the day.  Osteoporosis: She is taking Fosamax as prescribed. She is taking Calcium and Vit D daily.  Recurrent UTI: No recent UTI on Macrobid prophylaxis.  Past Medical History:  Diagnosis Date  . Allergy   . Arthritis   . Cancer ([facility]) 07/29/2016   right breast  . Cataract of both eyes   . Chicken pox   . Colon polyps   . GERD (gastroesophageal reflux disease)   . Hyperlipidemia   . Hypertension   . IBS (irritable bowel syndrome)   . Memory disorder 06/22/2017  . Sleep apnea    wears CPAP machine nightly    Current  Outpatient Medications  Medication Sig Dispense Refill  . alendronate (FOSAMAX) 70 MG tablet TAKE 1 TABLET EVERY 7 DAYS WITH A FULL GLASS OF WATER ON AN EMPTY STOMACH DO NOT LIE DOWN FOR AT LEAST 30 MIN 4 tablet 5  . ALPRAZolam (XANAX) 0.25 MG tablet TAKE ONE TABLET BY MOUTH TWICE DAILY AS NEEDED (Patient not taking: Reported on 04/05/2018) 60 tablet 0  . Calcium Carbonate-Vitamin D (CALCIUM-VITAMIN D) 500-200 MG-UNIT tablet Take 1 tablet by mouth daily.    . diphenoxylate-atropine (LOMOTIL) 2.5-0.025 MG tablet Take 1 tablet by mouth 4 (four) times daily as needed for diarrhea or loose stools. (Patient not taking: Reported on 04/05/2018) 30 tablet 1  . EPINEPHrine 0.3 mg/0.3 mL IJ SOAJ injection Inject 0.3 mg into the muscle once as needed (ALLERGIC REACTION).     Marland Kitchen exemestane (AROMASIN) 25 MG tablet Take 1 tablet (25 mg total) by mouth daily after breakfast. 30 tablet 1  . ferrous sulfate 325 (65 FE) MG tablet Take 325 mg by mouth daily with breakfast.    . lisinopril (PRINIVIL,ZESTRIL) 10 MG tablet TAKE 1 TABLET BY MOUTH DAILY 90 tablet 1  . Methylcellulose, Laxative, (CITRUCEL) 500 MG TABS Take 2 tablets by mouth 3 (three) times daily.    . nitrofurantoin (MACRODANTIN) 100 MG capsule Take 1 capsule by mouth daily.    . Probiotic Product (PROBIOTIC PO) Take 1 Dose  by mouth daily.    . sertraline (ZOLOFT) 25 MG tablet TAKE ONE (1) TABLET EACH DAY 90 tablet 0  . traMADol (ULTRAM) 50 MG tablet Take 1 tablet (50 mg total) by mouth every 8 (eight) hours as needed. (Patient not taking: Reported on 04/05/2018) 90 tablet 0  . trimethoprim (TRIMPEX) 100 MG tablet Take 1 tablet (100 mg total) by mouth daily. (Patient not taking: Reported on 04/05/2018) 30 tablet 2   No current facility-administered medications for this visit.     Allergies  Allergen Reactions  . Other Swelling and Shortness Of Breath    Cats, dogs, mold Induced asthma Cats, dogs, mold    Family History  Problem Relation Age of Onset   . Arthritis Mother   . Stroke Mother   . Hypertension Mother   . Cancer Father        Prostate  . Stroke Father   . Hypertension Father   . Hypertension Maternal Grandmother   . Rheum arthritis Maternal Grandfather   . Stroke Maternal Grandfather   . Hypertension Maternal Grandfather   . Cancer Paternal Grandmother        Colon  . Hypertension Paternal Grandmother   . Hypertension Paternal Grandfather   . Breast cancer Neg Hx     Social History   Socioeconomic History  . Marital status: Married    Spouse name: Not on file  . Number of children: 2  . Years of education: 2 years college  . Highest education level: Not on file  Occupational History  . Occupation: Retired  Scientific laboratory technician  . Financial resource strain: Not on file  . Food insecurity:    Worry: Not on file    Inability: Not on file  . Transportation needs:    Medical: Not on file    Non-medical: Not on file  Tobacco Use  . Smoking status: Former Smoker    Packs/day: 2.00    Years: 25.00    Pack years: 50.00    Last attempt to quit: 12/22/1990    Years since quitting: 27.5  . Smokeless tobacco: Never Used  . Tobacco comment: quit 24 years ago  Substance and Sexual Activity  . Alcohol use: Yes    Comment: rare  . Drug use: No  . Sexual activity: Not Currently  Lifestyle  . Physical activity:    Days per week: Not on file    Minutes per session: Not on file  . Stress: Not on file  Relationships  . Social connections:    Talks on phone: Not on file    Gets together: Not on file    Attends religious service: Not on file    Active member of club or organization: Not on file    Attends meetings of clubs or organizations: Not on file    Relationship status: Not on file  . Intimate partner violence:    Fear of current or ex partner: Not on file    Emotionally abused: Not on file    Physically abused: Not on file    Forced sexual activity: Not on file  Other Topics Concern  . Not on file  Social  History Narrative   Lives at home with her husband.   Right-handed.   Occasional caffeine use.    Hospitiliaztions: None  Health Maintenance:    Flu: 10/2017  Tetanus: 06/2014  Pneumovax: 01/2012  Prevnar: 03/2015  Zostavax: 01/2014  Shingrix: never  Mammogram: 08/2017  Pap Smear: non longer screening  Bone Density: 03/2017  Colon Screening: 2015  Eye Doctor: annually  Dental Exam: annually   Providers:   PCP: [name], NP-C  Neurologist: Dr. [name]  Oncology: Dr. Burr Medico    I have personally reviewed and have noted:  1. The patient's medical and social history 2. Their use of alcohol, tobacco or illicit drugs 3. Their current medications and supplements 4. The patient's functional ability including ADL's, fall risks, home safety risks and hearing or visual impairment. 5. Diet and physical activities 6. Evidence for depression or mood disorder  Subjective:   Review of Systems:   Constitutional: Pt reports weight gain. Denies fever, malaise, fatigue, headache.  HEENT: Denies eye pain, eye redness, ear pain, ringing in the ears, wax buildup, runny nose, nasal congestion, bloody nose, or sore throat. Respiratory: Denies difficulty breathing, shortness of breath, cough or sputum production.   Cardiovascular: Denies chest pain, chest tightness, palpitations or swelling in the hands or feet.  Gastrointestinal: Pt reports intermittent diarrhea. Denies abdominal pain, bloating, constipation, or blood in the stool.  GU: Denies urgency, frequency, pain with urination, burning sensation, blood in urine, odor or discharge. Musculoskeletal: Pt reports intermittent joint pain. Denies decrease in range of motion, difficulty with gait, muscle pain or joint swelling.  Skin: Denies redness, rashes, lesions or ulcercations.  Neurological: Pt reports expressive aphasia. Denies dizziness, difficulty with memory, or problems with balance and coordination.  Psych: Pt has a history of  anxiety. Denies depression, SI/HI.  No other specific complaints in a complete review of systems (except as listed in HPI above).  Objective:  PE:   BP 132/80   Pulse 62   Temp 98.3 F (36.8 C) (Oral)   Ht 5' (1.524 m)   Wt 200 lb (90.7 kg)   SpO2 95%   BMI 39.06 kg/m   Wt Readings from Last 3 Encounters:  04/05/18 185 lb 8 oz (84.1 kg)  02/12/18 181 lb 8 oz (82.3 kg)  01/22/18 185 lb 8 oz (84.1 kg)    General: Appears her stated age, obese in NAD. Skin: Warm, dry and intact. No ulcerations noted. HEENT: Head: normal shape and size; Eyes: sclera white, no icterus, conjunctiva pink, PERRLA and EOMs intact; Ears: Tm's gray and intact, normal light reflex; Throat/Mouth: Teeth present, mucosa pink and moist, no exudate, lesions or ulcerations noted.  Neck: Neck supple, trachea midline. No masses, lumps or thyromegaly present.  Cardiovascular: Normal rate and rhythm. S1,S2 noted.  Murmur noted. No JVD or BLE edema. No carotid bruits noted. Pulmonary/Chest: Normal effort and positive vesicular breath sounds. No respiratory distress. No wheezes, rales or ronchi noted.  Abdomen: Soft and nontender. Normal bowel sounds. No distention or masses noted. Liver, spleen and kidneys non palpable. Musculoskeletal:  Strength 5/5 BUE/BLE. No signs of joint swelling.  Neurological: Alert and oriented. Mild expressive aphasia noted. Psychiatric: Mood and affect normal. Behavior is normal. Judgment and thought content normal.     BMET    Component Value Date/Time   NA 139 01/22/2018 1316   NA 140 09/22/2017 1003   K 5.4 (H) 01/22/2018 1316   K 3.8 09/22/2017 1003   CL 102 01/22/2018 1316   CO2 31 (H) 01/22/2018 1316   CO2 29 09/22/2017 1003   GLUCOSE 121 01/22/2018 1316   GLUCOSE 116 09/22/2017 1003   BUN 25 01/22/2018 1316   BUN 22.4 09/22/2017 1003   CREATININE 0.92 01/22/2018 1316   CREATININE 0.8 09/22/2017 1003   CALCIUM 9.9 01/22/2018 1316  CALCIUM 10.0 09/22/2017 1003    GFRNONAA 60 (L) 01/22/2018 1316   GFRAA >60 01/22/2018 1316    Lipid Panel     Component Value Date/Time   CHOL 155 03/05/2017 1058   TRIG 94.0 03/05/2017 1058   HDL 45.50 03/05/2017 1058   CHOLHDL 3 03/05/2017 1058   VLDL 18.8 03/05/2017 1058   LDLCALC 91 03/05/2017 1058    CBC    Component Value Date/Time   WBC 5.7 01/22/2018 1316   RBC 4.64 01/22/2018 1316   HGB 13.2 01/22/2018 1316   HGB 10.3 (L) 09/22/2017 1003   HCT 41.9 01/22/2018 1316   HCT 31.9 (L) 09/22/2017 1003   PLT 199 01/22/2018 1316   PLT 350 09/22/2017 1003   MCV 90.3 01/22/2018 1316   MCV 80.6 09/22/2017 1003   MCH 28.4 01/22/2018 1316   MCHC 31.5 01/22/2018 1316   RDW 15.2 (H) 01/22/2018 1316   RDW 20.1 (H) 09/22/2017 1003   LYMPHSABS 1.8 01/22/2018 1316   LYMPHSABS 1.5 09/22/2017 1003   MONOABS 0.4 01/22/2018 1316   MONOABS 0.4 09/22/2017 1003   EOSABS 0.1 01/22/2018 1316   EOSABS 0.1 09/22/2017 1003   BASOSABS 0.0 01/22/2018 1316   BASOSABS 0.0 09/22/2017 1003    Hgb A1C Lab Results  Component Value Date   HGBA1C 5.5 03/05/2017      Assessment and Plan:   Medicare Annual Wellness Visit:  Diet: She does eat meat. She consumes fruits and veggies daily. She tries to avoid fried foods. She drinks mostly water, cranberry juice, occasional soda. Physical activity: Occassionally walking. Depression/mood screen: Chronic, on meds Hearing: Intact to whispered voice Visual acuity: Grossly normal, performs annual eye exam  ADLs: Needs assist, has aides 5 days a week for 4 hours Fall risk: High Home safety: Good Cognitive evaluation: Trouble with recall. Intact to orientation, naming, recall and repetition EOL planning: No adv directives, full code/ I agree  Preventative Medicine: Encouraged her to get a flu shot in the fall. Tetanus, pneumovax, prevnar and zostovax UTD. She declines shingrix. Mammogram UTD. She declines bone density. Colon screening UTD. She no longer needs pap smears.  Encouraged her to consume a balanced diet and exercise regimen. Advised her to see an eye doctor and dentist annually. Will check CBC, CMET, Lipid, A1C and Vit D today.   Next appointment: 1 year, Medicare Wellness Exam   [name], NP

## 2018-07-03 LAB — COMPREHENSIVE METABOLIC PANEL
AG Ratio: 1.5 (calc) (ref 1.0–2.5)
ALKALINE PHOSPHATASE (APISO): 89 U/L (ref 33–130)
ALT: 14 U/L (ref 6–29)
AST: 17 U/L (ref 10–35)
Albumin: 3.8 g/dL (ref 3.6–5.1)
BUN: 23 mg/dL (ref 7–25)
CALCIUM: 9.8 mg/dL (ref 8.6–10.4)
CHLORIDE: 104 mmol/L (ref 98–110)
CO2: 29 mmol/L (ref 20–32)
CREATININE: 0.71 mg/dL (ref 0.60–0.93)
GLUCOSE: 104 mg/dL — AB (ref 65–99)
Globulin: 2.6 g/dL (calc) (ref 1.9–3.7)
POTASSIUM: 5.1 mmol/L (ref 3.5–5.3)
Sodium: 141 mmol/L (ref 135–146)
Total Bilirubin: 0.4 mg/dL (ref 0.2–1.2)
Total Protein: 6.4 g/dL (ref 6.1–8.1)

## 2018-07-03 LAB — CBC
HCT: 40.9 % (ref 35.0–45.0)
Hemoglobin: 13.3 g/dL (ref 11.7–15.5)
MCH: 29.8 pg (ref 27.0–33.0)
MCHC: 32.5 g/dL (ref 32.0–36.0)
MCV: 91.7 fL (ref 80.0–100.0)
MPV: 9.7 fL (ref 7.5–12.5)
PLATELETS: 243 10*3/uL (ref 140–400)
RBC: 4.46 10*6/uL (ref 3.80–5.10)
RDW: 12.5 % (ref 11.0–15.0)
WBC: 5.3 10*3/uL (ref 3.8–10.8)

## 2018-07-03 LAB — LIPID PANEL
Cholesterol: 208 mg/dL — ABNORMAL HIGH (ref <200)
HDL: 47 mg/dL — ABNORMAL LOW (ref >50)
LDL CHOLESTEROL (CALC): 130 mg/dL — AB
Non-HDL Cholesterol (Calc): 161 mg/dL (calc) — ABNORMAL HIGH (ref <130)
Total CHOL/HDL Ratio: 4.4 (calc) (ref <5.0)
Triglycerides: 176 mg/dL — ABNORMAL HIGH (ref <150)

## 2018-07-03 LAB — HEMOGLOBIN A1C
Hgb A1c MFr Bld: 5.6 % of total Hgb (ref <5.7)
Mean Plasma Glucose: 114 (calc)
eAG (mmol/L): 6.3 (calc)

## 2018-07-03 LAB — VITAMIN D 25 HYDROXY (VIT D DEFICIENCY, FRACTURES): Vit D, 25-Hydroxy: 25 ng/mL — ABNORMAL LOW (ref 30–100)

## 2018-07-05 ENCOUNTER — Other Ambulatory Visit: Payer: Self-pay | Admitting: Hematology

## 2018-07-08 DIAGNOSIS — D509 Iron deficiency anemia, unspecified: Secondary | ICD-10-CM | POA: Insufficient documentation

## 2018-07-08 NOTE — Assessment & Plan Note (Signed)
Stable on Lomotil, refilled today Discussed FODMAP diet, control of anxiety

## 2018-07-08 NOTE — Assessment & Plan Note (Signed)
Pain controlled with Tramadol Encouraged weight loss and regular activity

## 2018-07-08 NOTE — Assessment & Plan Note (Signed)
A1C today No microalbumin secondary to ACEI therapy Encouraged her to consume a low carb diet, exercise for weight loss No meds at this time Foot exam today Encouraged yearly eye exams Immunizations UTD

## 2018-07-08 NOTE — Assessment & Plan Note (Signed)
Continue Macrobid prophylaxis

## 2018-07-08 NOTE — Assessment & Plan Note (Signed)
Controlled on Lisinopril CBC and CMET today Reinforced DASH diet and exercise for weight loss 

## 2018-07-08 NOTE — Assessment & Plan Note (Signed)
CMET and Lipid profile today Encouraged her to consume a low fat diet 

## 2018-07-08 NOTE — Assessment & Plan Note (Signed)
Currently not an issue off meds Will monitor 

## 2018-07-08 NOTE — Assessment & Plan Note (Signed)
Stable on Sertraline and rare Xanax use CMET today Will monitor

## 2018-07-08 NOTE — Assessment & Plan Note (Signed)
No meds She will continue to follow with neurology

## 2018-07-08 NOTE — Assessment & Plan Note (Signed)
In remission Continue Exemestane She will continue to follow with oncology

## 2018-07-08 NOTE — Assessment & Plan Note (Signed)
Encouraged weight loss No longer wearing CPAP

## 2018-07-08 NOTE — Assessment & Plan Note (Signed)
CBC today Continue OTC iron supplement

## 2018-07-08 NOTE — Assessment & Plan Note (Signed)
Vit D today Continue Fosamax She declines bone density

## 2018-07-21 NOTE — Progress Notes (Signed)
[place]  [phone] [phone]  Clinic Follow up Note   Patient Care Team: [name], NP as PCP - General (Internal Medicine) 07/22/2018  SUMMARY OF ONCOLOGIC HISTORY: Oncology History   Breast cancer of lower-inner quadrant of right female breast [facility])   Staging form: Breast, AJCC 7th Edition   - Clinical stage from 06/09/2016: Stage IIB (T2, N1, M0) - Signed by Truitt Merle, MD on 06/27/2016   - Pathologic stage from 07/29/2016: Stage IIA (T1c, N1a, cM0) - Signed by Truitt Merle, MD on 08/13/2016       Breast cancer of lower-inner quadrant of right female breast ([place])   05/23/2016 Mammogram    Diagnostic mammogram and ultrasound showed suspicious architectural distortion within the right breast lower inner quadrant, measuring 1.3 cm, without sonographic corelate.      06/03/2016 Initial Biopsy    Right breast inferior lower quadrant core needle biopsy showed atypical ductal hyperplasia with calcifications      06/09/2016 Receptors her2    Breast biopsy showed ER 100% positive, PR 100% positive, HER-2 negative, Ki-67 40%      06/09/2016 Initial Diagnosis    Breast cancer of lower-inner quadrant of right female breast ([place])      06/09/2016 Initial Biopsy    Right breast inner quadrant core needle biopsy showed invasive ductal carcinoma and DCIS, grade 1-2      06/17/2016 Initial Biopsy    Right axillary lymph node core needle biopsy showed metastatic carcinoma      06/17/2016 Receptors her2    Axillary node biopsy showed ER 100% positive, PR 95% positive, HER-2 negative      06/19/2016 Imaging    Bilateral breast MRI showed locations in the lower inner right breast, largest 2.7X1.3X1.6cm, biopsy clips in 2 of this areas. There are abnormal right axillary lymph nodes showing second cortical's, no evidence of malignancy in the left breast.      07/29/2016 Surgery    Right lumpectomy and ALND      07/29/2016 Pathology Results    Right  lumpectomy showed G3 IDC, DCIS, margins (-), LVI(-).  1 of 16 nodes was positive       07/29/2016 Miscellaneous    Mammaprint showed high risk disease, luminal type B      09/09/2016 - 11/12/2016 Adjuvant Chemotherapy    Docetaxel and Cytoxan (TC) every 3 weeks      12/24/2016 - 02/03/2017 Radiation Therapy    Adjuvant breast radiation 12/24/16 - 02/03/17 : Right Breast treated to 50.4 Gy in 28 fractions. Right Axilla treated to 45 Gy in 25 fractions.      02/17/2017 -  Anti-estrogen oral therapy    Letrozole 2.5 mg daily       03/23/2017 Imaging    DG Bone Density 03/23/17 ASSESSMENT: The BMD measured at Femur Neck Right is 0.809 g/cm2 with a T-score of -1.6. This patient is considered osteopenic according to [facility]) criteria.      09/08/2017 Mammogram    Diagnostic Mammogram 09/08/17  IMPRESSION: No mammographic evidence of malignancy in either breast, status post right lumpectomy. RECOMMENDATION: Diagnostic mammogram is suggested in 1 year. (Code:[id number])     CURRENT THERAPY: Letrozole 2.'5mg'$  daily since 02/17/17, stopped 09/22/17 and switched to Exemestane on 10/06/17    INTERVAL HISTORY: Ms. Stare returns with her sister for routine breast cancer surveillance. She denies changes in her health in the interim. Continues exemestane without difficulty. Denies hot flashes.  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 She saw PCP recently and found to have vitamin D insufficiency. She is on calcium-vitamin D supplement. Her activity level is at her baseline. She completes ADLs on without difficulty as long as it's on the "ground level." Appetite is normal. She has "irritable bowel" with periodic diarrhea. Lomotil PRN helps. No blood in stool. Takes an iron supplement daily and fiber supplements. She takes daily antibiotic for UTI prevention. Her memory is much improved. She has home health.   REVIEW OF SYSTEMS:   Constitutional: Denies fevers,  chills or abnormal weight loss Ears, nose, mouth, throat, and face: Denies mucositis or sore throat Respiratory: Denies cough, dyspnea or wheezes Cardiovascular: Denies palpitation, chest discomfort or lower extremity swelling Gastrointestinal:  Denies nausea, vomiting, constipation, heartburn or change in bowel habits (+) periodic diarrhea with "irritable bowel"  Skin: Denies abnormal skin rashes Lymphatics: Denies new lymphadenopathy or easy bruising  Neurological:Denies numbness, tingling, new weaknesses, or hot flashes (+) memory issues improving   Behavioral/Psych: Mood is stable, no new changes  MSK: (+) chronic arthritic pain, knees, hips  Breast: (+) skin firmness (+) denies new lump or nipple discharge  All other systems were reviewed with the patient and are negative.  MEDICAL HISTORY:  Past Medical History:  Diagnosis Date  . Allergy   . Arthritis   . Cancer ([place]) 07/29/2016   right breast  . Cataract of both eyes   . Chicken pox   . Colon polyps   . GERD (gastroesophageal reflux disease)   . Hyperlipidemia   . Hypertension   . IBS (irritable bowel syndrome)   . Memory disorder 06/22/2017  . Sleep apnea    wears CPAP machine nightly    SURGICAL HISTORY: Past Surgical History:  Procedure Laterality Date  . BACK SURGERY     lower  . BREAST CYST EXCISION Right 2001   negative  . BREAST LUMPECTOMY Right 08/29/2016   negative margins  . BREAST LUMPECTOMY WITH NEEDLE LOCALIZATION AND AXILLARY LYMPH NODE DISSECTION Right 07/29/2016   Procedure: RIGHT BREAST LUMPECTOMY WITH DOUBLE NEEDLE LOCALIZATION AND COMPLETE RIGHT AXILLARY LYMPH NODE DISSECTION;  Surgeon: Fanny Skates, MD;  Location: [facility];  Service: General;  Laterality: Right;  . BREAST SURGERY Left 2000   Biopsy  . BUNIONECTOMY Bilateral 1998   great toe fusion on right foot  . COLONOSCOPY W/ POLYPECTOMY    . HERNIA REPAIR  2542   [place] SINUS SURGERY  2015  . PORTACATH PLACEMENT N/A 09/05/2016     Procedure: INSERTION PORT-A-CATH;  Surgeon: Fanny Skates, MD;  Location: [facility];  Service: General;  Laterality: N/A;  . REPLACEMENT TOTAL KNEE Bilateral 2007  . TOTAL SHOULDER REPLACEMENT Left 2007    I have reviewed the social history and family history with the patient and they are unchanged from previous note.  ALLERGIES:  is allergic to other.  MEDICATIONS:  Current Outpatient Medications  Medication Sig Dispense Refill  . alendronate (FOSAMAX) 70 MG tablet TAKE 1 TABLET EVERY 7 DAYS WITH A FULL GLASS OF WATER ON AN EMPTY STOMACH DO NOT LIE DOWN FOR AT LEAST 30 MIN 4 tablet 5  . ALPRAZolam (XANAX) 0.25 MG tablet TAKE ONE TABLET BY MOUTH TWICE DAILY AS NEEDED 60 tablet 0  . Calcium Carbonate-Vitamin D (CALCIUM-VITAMIN D) 500-200 MG-UNIT tablet Take 1 tablet by mouth daily.    . diphenoxylate-atropine (LOMOTIL) 2.5-0.025 MG tablet Take 1 tablet by mouth 4 (four) times daily as needed  for diarrhea or loose stools. 30 tablet 5  . EPINEPHrine 0.3 mg/0.3 mL IJ SOAJ injection Inject 0.3 mg into the muscle once as needed (ALLERGIC REACTION).     Marland Kitchen exemestane (AROMASIN) 25 MG tablet Take 1 tablet (25 mg total) by mouth daily. 30 tablet 6  . ferrous sulfate 325 (65 FE) MG tablet Take 325 mg by mouth daily with breakfast.    . lisinopril (PRINIVIL,ZESTRIL) 10 MG tablet TAKE 1 TABLET BY MOUTH DAILY 90 tablet 1  . Methylcellulose, Laxative, (CITRUCEL) 500 MG TABS Take 2 tablets by mouth 3 (three) times daily.    . nitrofurantoin (MACRODANTIN) 100 MG capsule Take 1 capsule by mouth daily.    . Probiotic Product (PROBIOTIC PO) Take 1 Dose by mouth daily.    . sertraline (ZOLOFT) 50 MG tablet Take 1 tablet by mouth daily.    . traMADol (ULTRAM) 50 MG tablet Take 1 tablet (50 mg total) by mouth every 8 (eight) hours as needed. 90 tablet 0  . nystatin (MYCOSTATIN/NYSTOP) powder Apply topically 3 (three) times daily. 15 g 0   No current facility-administered medications for this visit.      PHYSICAL EXAMINATION: ECOG PERFORMANCE STATUS: 1-2   Vitals:   07/22/18 1315  BP: (!) 164/57  Pulse: 66  Resp: 18  Temp: 98.2 F (36.8 C)  SpO2: 100%   Filed Weights   07/22/18 1315  Weight: 200 lb (90.7 kg)    GENERAL:alert, no distress and comfortable SKIN:  no rashes or significant lesions EYES:  sclera clear OROPHARYNX:no thrush or ulcers   LYMPH:  no palpable cervical or supraclavicular lymphadenopathy  LUNGS: clear to auscultation with normal breathing effort HEART: regular rate & rhythm and no murmurs and no lower extremity edema ABDOMEN:abdomen soft, non-tender and normal bowel sounds Musculoskeletal:no cyanosis of digits and no clubbing  NEURO: alert & oriented x 3 with fluent speech BREAST: inspection shows right breast with mild diffuse erythema to the lower breast. There is erythema to bilateral inframammary folds. No palpable mass in either breast or axilla that I can appreciate   LABORATORY DATA:  I have reviewed the data as listed CBC Latest Ref Rng & Units 07/22/2018 07/02/2018 01/22/2018  WBC 3.9 - 10.3 K/uL 5.0 5.3 5.7  Hemoglobin 11.6 - 15.9 g/dL 13.8 13.3 13.2  Hematocrit 34.8 - 46.6 % 42.0 40.9 41.9  Platelets 145 - 400 K/uL 221 243 199     CMP Latest Ref Rng & Units 07/22/2018 07/02/2018 01/22/2018  Glucose 70 - 99 mg/dL 93 104(H) 121  BUN 8 - 23 mg/dL 30(H) 23 25  Creatinine 0.44 - 1.00 mg/dL 0.77 0.71 0.92  Sodium 135 - 145 mmol/L 144 141 139  Potassium 3.5 - 5.1 mmol/L 4.8 5.1 5.4(H)  Chloride 98 - 111 mmol/L 104 104 102  CO2 22 - 32 mmol/L 34(H) 29 31(H)  Calcium 8.9 - 10.3 mg/dL 10.5(H) 9.8 9.9  Total Protein 6.5 - 8.1 g/dL 7.4 6.4 7.1  Total Bilirubin 0.3 - 1.2 mg/dL 0.4 0.4 0.4  Alkaline Phos 38 - 126 U/L 95 - 136  AST 15 - 41 U/L _0 ALT 0 - 44 U/L _1 RADIOGRAPHIC STUDIES: I have personally reviewed the radiological images as listed and agreed with the findings in the report. No results found.   ASSESSMENT & PLAN:  77 y.o.Caucasian female, with mammogram discovered right breast cancer.  1. 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 Memory loss/cognitive dysfunction and intermittent confusion  3. Hyperglycemia 4. Right upper extremity lymphedema  5. HTN and arthritis  6. Morbid obesity  7. Bone health 8. Recurrent UTIs   Ms. Vales appears stable. She is doing well from a breast cancer standpoint. She has skin eruption under her breasts, I prescribed nystatin powder for her to use. Otherwise, her clinical breast exam is unremarkable. 2018 mammogram was negative. She is due annual mammogram in 08/2018, I ordered today. She is tolerating exemestane well, she will continue. Will continue breast cancer surveillance.   Per PCP, her vitamin D is low, she was recommended to take 5000 IU vit D daily. Ca today is 10.5, I recommend she hold calcium supplement for now and continue with vitamin D only.   Plan for her to return for lab and f/u with Dr. Burr Medico in 6 months.    PLAN: -Labs reviewed -Continue breast cancer surveillance -Continue daily exemestane, I refilled today -Hold calcium supplement, take vitamin D 5000 IU daily per PCP -Annual mammogram due 08/2018, ordered today  -Nystatin powder for skin rash, prescribed today  -Lab, f/u with Dr. Burr Medico in 6 months    Orders Placed This Encounter  Procedures  . MM DIAG BREAST TOMO BILATERAL    Standing Status:   Future    Standing Expiration Date:   07/23/2019    Scheduling Instructions:     [facility] [place] Specific Question:   Reason for Exam (SYMPTOM  OR DIAGNOSIS REQUIRED)    Answer:   h/o right breast cancer, s/p lumpectomy chemo, RT; on AI    Order Specific Question:   Preferred imaging location?    Answer:   [facility]   All questions were answered. The patient knows to call the clinic with any  problems, questions or concerns. No barriers to learning was detected. I spent 20 minutes counseling the patient face to face. The total time spent in the appointment was 25 minutes and more than 50% was on counseling and review of test results     Alla Feeling, NP 07/22/18

## 2018-07-22 ENCOUNTER — Inpatient Hospital Stay: Payer: Medicare PPO | Attending: Nurse Practitioner | Admitting: Nurse Practitioner

## 2018-07-22 ENCOUNTER — Encounter: Payer: Self-pay | Admitting: Nurse Practitioner

## 2018-07-22 ENCOUNTER — Telehealth: Payer: Self-pay

## 2018-07-22 ENCOUNTER — Inpatient Hospital Stay: Payer: Medicare PPO

## 2018-07-22 VITALS — BP 164/57 | HR 66 | Temp 98.2°F | Resp 18 | Ht 60.0 in | Wt 200.0 lb

## 2018-07-22 DIAGNOSIS — Z79811 Long term (current) use of aromatase inhibitors: Secondary | ICD-10-CM | POA: Insufficient documentation

## 2018-07-22 DIAGNOSIS — K589 Irritable bowel syndrome without diarrhea: Secondary | ICD-10-CM | POA: Insufficient documentation

## 2018-07-22 DIAGNOSIS — M1990 Unspecified osteoarthritis, unspecified site: Secondary | ICD-10-CM | POA: Diagnosis not present

## 2018-07-22 DIAGNOSIS — Z79899 Other long term (current) drug therapy: Secondary | ICD-10-CM | POA: Insufficient documentation

## 2018-07-22 DIAGNOSIS — I890 Lymphedema, not elsewhere classified: Secondary | ICD-10-CM | POA: Insufficient documentation

## 2018-07-22 DIAGNOSIS — R739 Hyperglycemia, unspecified: Secondary | ICD-10-CM | POA: Insufficient documentation

## 2018-07-22 DIAGNOSIS — C50311 Malignant neoplasm of lower-inner quadrant of right female breast: Secondary | ICD-10-CM | POA: Diagnosis present

## 2018-07-22 DIAGNOSIS — Z87440 Personal history of urinary (tract) infections: Secondary | ICD-10-CM | POA: Diagnosis not present

## 2018-07-22 DIAGNOSIS — M85861 Other specified disorders of bone density and structure, right lower leg: Secondary | ICD-10-CM

## 2018-07-22 DIAGNOSIS — I10 Essential (primary) hypertension: Secondary | ICD-10-CM | POA: Diagnosis not present

## 2018-07-22 DIAGNOSIS — Z170 Estrogen receptor positive status [ER+]: Principal | ICD-10-CM

## 2018-07-22 DIAGNOSIS — R21 Rash and other nonspecific skin eruption: Secondary | ICD-10-CM | POA: Diagnosis not present

## 2018-07-22 DIAGNOSIS — M85862 Other specified disorders of bone density and structure, left lower leg: Secondary | ICD-10-CM

## 2018-07-22 LAB — CBC WITH DIFFERENTIAL/PLATELET
BASOS ABS: 0 10*3/uL (ref 0.0–0.1)
Basophils Relative: 1 %
Eosinophils Absolute: 0.1 10*3/uL (ref 0.0–0.5)
Eosinophils Relative: 2 %
HCT: 42 % (ref 34.8–46.6)
Hemoglobin: 13.8 g/dL (ref 11.6–15.9)
LYMPHS ABS: 1.5 10*3/uL (ref 0.9–3.3)
Lymphocytes Relative: 29 %
MCH: 30.3 pg (ref 25.1–34.0)
MCHC: 32.9 g/dL (ref 31.5–36.0)
MCV: 92.3 fL (ref 79.5–101.0)
MONO ABS: 0.4 10*3/uL (ref 0.1–0.9)
Monocytes Relative: 8 %
NEUTROS ABS: 3 10*3/uL (ref 1.5–6.5)
Neutrophils Relative %: 60 %
Platelets: 221 10*3/uL (ref 145–400)
RBC: 4.55 MIL/uL (ref 3.70–5.45)
RDW: 14 % (ref 11.2–14.5)
WBC: 5 10*3/uL (ref 3.9–10.3)

## 2018-07-22 LAB — COMPREHENSIVE METABOLIC PANEL
ALBUMIN: 3.8 g/dL (ref 3.5–5.0)
ALT: 22 U/L (ref 0–44)
ANION GAP: 6 (ref 5–15)
AST: 20 U/L (ref 15–41)
Alkaline Phosphatase: 95 U/L (ref 38–126)
BILIRUBIN TOTAL: 0.4 mg/dL (ref 0.3–1.2)
BUN: 30 mg/dL — ABNORMAL HIGH (ref 8–23)
CO2: 34 mmol/L — ABNORMAL HIGH (ref 22–32)
Calcium: 10.5 mg/dL — ABNORMAL HIGH (ref 8.9–10.3)
Chloride: 104 mmol/L (ref 98–111)
Creatinine, Ser: 0.77 mg/dL (ref 0.44–1.00)
GFR calc Af Amer: >60 mL/min (ref >60)
GFR calc non Af Amer: >60 mL/min (ref >60)
GLUCOSE: 93 mg/dL (ref 70–99)
POTASSIUM: 4.8 mmol/L (ref 3.5–5.1)
SODIUM: 144 mmol/L (ref 135–145)
TOTAL PROTEIN: 7.4 g/dL (ref 6.5–8.1)

## 2018-07-22 MED ORDER — NYSTATIN 100000 UNIT/GM EX POWD: Freq: Three times a day (TID) | CUTANEOUS | 0 refills | Status: DC

## 2018-07-22 MED ORDER — EXEMESTANE 25 MG PO TABS: 25.0000 mg | ORAL_TABLET | Freq: Every day | ORAL | 6 refills | Status: DC

## 2018-07-22 NOTE — Telephone Encounter (Signed)
Printed avs calender of upcoming per 8/1 los

## 2018-07-23 LAB — VITAMIN D 25 HYDROXY (VIT D DEFICIENCY, FRACTURES): Vit D, 25-Hydroxy: 20.5 ng/mL — ABNORMAL LOW (ref 30.0–100.0)

## 2018-07-26 ENCOUNTER — Telehealth: Payer: Self-pay

## 2018-07-26 NOTE — Telephone Encounter (Signed)
Left voice message for patient per Dr. Burr Medico, Vitamin D level is slow, please double your vitamin D, calcium is slightly elevated, stop calcium for now.  Encouraged her to call back if she has questions.

## 2018-07-26 NOTE — Telephone Encounter (Signed)
-----   Message from Truitt Merle, MD sent at 07/24/2018  1:49 PM EDT ----- Please let pt know her Vitd level is low, please increase (double) her vitD intake, calcium slightly elevated, stop calcium if she is on now, thanks  [employer]  07/24/2018

## 2018-08-25 ENCOUNTER — Other Ambulatory Visit: Payer: Self-pay | Admitting: Internal Medicine

## 2018-08-25 MED ORDER — DIPHENOXYLATE-ATROPINE 2.5-0.025 MG PO TABS: 1.0000 | ORAL_TABLET | Freq: Four times a day (QID) | ORAL | 5 refills | Status: DC | PRN

## 2018-08-25 NOTE — Telephone Encounter (Signed)
Lomotil 2.5-0.025 refill request  LR:  07/02/18  #30  LOV:  07/02/18  PCP:  [name]  Phar:  [place], [place]

## 2018-08-25 NOTE — Telephone Encounter (Signed)
[name] (DPR signed) notified that [facility]. [name] voiced understanding.

## 2018-08-25 NOTE — Telephone Encounter (Signed)
Copied from [place] [id number]. Topic: Quick Communication - Rx Refill/Question >> Aug 25, 2018  3:02 PM [place] [facility] wrote: Medication: diphenoxylate-atropine (LOMOTIL) 2.5-0.025 MG tablet  Patient's sister called to request a refill for the above medication.  Call sister with any questions at [phone] or [phone]  Preferred Pharmacy (with phone number or street name): [place], [place] - [address] [phone] (Phone) [phone] (Fax)

## 2018-09-09 ENCOUNTER — Ambulatory Visit
Admission: RE | Admit: 2018-09-09 | Discharge: 2018-09-09 | Disposition: A | Discharge status: Home / Self Care | Payer: Medicare PPO | Source: Ambulatory Visit | Attending: Nurse Practitioner | Admitting: Nurse Practitioner

## 2018-09-09 DIAGNOSIS — Z170 Estrogen receptor positive status [ER+]: Secondary | ICD-10-CM | POA: Diagnosis present

## 2018-09-09 DIAGNOSIS — C50311 Malignant neoplasm of lower-inner quadrant of right female breast: Secondary | ICD-10-CM | POA: Insufficient documentation

## 2018-10-04 NOTE — Progress Notes (Signed)
[facility]  PATIENT: Tiffany Arnold DOB: 01-Mar-1941   REASON FOR VISIT: Follow-up for memory loss HISTORY FROM: Patient and sister   HISTORY OF PRESENT ILLNESS:UPDATE 10/15/2019CM Tiffany Arnold, 77 year old female returns for follow-up with a history of memory disturbance.  She reports today that she is having more word finding difficulty.  She is on a daily antibiotic to prevent bladder infections   She continues to live at home with her husband she has had one fall in the last 6 months, no apparent injury uses a rolling walker. She can continue to feed dress and bathe herself.  She does not do any cooking.  She does not drive.  She returns for reevaluation  4/15/19[name] is a 77 year old right-handed white female with a history of a memory disturbance.  The patient had a significant change in her memory around 27 Apr 2017 secondary to an episode of hypoglycemia.  The patient has had episodes of confusion off and on when she gets bladder infections, she has had 2 or 3-year history of mild memory problems.  The patient now has returned home from [place].  She has been functioning fairly well.  She has not worsened with her memory over the last 6 months.  The patient now is on a daily antibiotic to prevent bladder infections.  She has been seen through urology.  REVIEW OF SYSTEMS: Full 14 system review of systems performed and notable only for those listed, all others are neg:  Constitutional: neg  Cardiovascular: neg Ear/Nose/Throat: neg  Skin: neg Eyes: neg Respiratory: neg Gastroitestinal: Diarrhea Hematology/Lymphatic: neg  Endocrine: neg Musculoskeletal: Joint pain, difficulty walking Allergy/Immunology: Environmental allergies Neurological: Memory loss, word finding issues Psychiatric: neg Sleep : neg   ALLERGIES: Allergies  Allergen Reactions  . Other Swelling and Shortness Of Breath    Cats, dogs, mold Induced asthma Cats, dogs, mold    HOME  MEDICATIONS: Outpatient Medications Prior to Visit  Medication Sig Dispense Refill  . alendronate (FOSAMAX) 70 MG tablet TAKE 1 TABLET EVERY 7 DAYS WITH A FULL GLASS OF WATER ON AN EMPTY STOMACH DO NOT LIE DOWN FOR AT LEAST 30 MIN 4 tablet 5  . ALPRAZolam (XANAX) 0.25 MG tablet TAKE ONE TABLET BY MOUTH TWICE DAILY AS NEEDED 60 tablet 0  . Calcium Carbonate-Vitamin D (CALCIUM-VITAMIN D) 500-200 MG-UNIT tablet Take 1 tablet by mouth daily.    . diphenoxylate-atropine (LOMOTIL) 2.5-0.025 MG tablet Take 1 tablet by mouth 4 (four) times daily as needed for diarrhea or loose stools. 30 tablet 5  . EPINEPHrine 0.3 mg/0.3 mL IJ SOAJ injection Inject 0.3 mg into the muscle once as needed (ALLERGIC REACTION).     Marland Kitchen exemestane (AROMASIN) 25 MG tablet Take 1 tablet (25 mg total) by mouth daily. 30 tablet 6  . ferrous sulfate 325 (65 FE) MG tablet Take 325 mg by mouth daily with breakfast.    . lisinopril (PRINIVIL,ZESTRIL) 10 MG tablet TAKE ONE TABLET BY MOUTH EVERY DAY 90 tablet 1  . Methylcellulose, Laxative, (CITRUCEL) 500 MG TABS Take 2 tablets by mouth 3 (three) times daily.    . nitrofurantoin (MACRODANTIN) 100 MG capsule Take 1 capsule by mouth daily.    Marland Kitchen nystatin (MYCOSTATIN/NYSTOP) powder Apply topically 3 (three) times daily. 15 g 0  . Probiotic Product (PROBIOTIC PO) Take 1 Dose by mouth daily.    . sertraline (ZOLOFT) 50 MG tablet Take 1 tablet by mouth daily.    . traMADol (ULTRAM) 50 MG tablet Take 1  tablet (50 mg total) by mouth every 8 (eight) hours as needed. 90 tablet 0   No facility-administered medications prior to visit.     PAST MEDICAL HISTORY: Past Medical History:  Diagnosis Date  . Allergy   . Arthritis   . Cancer ([place]) 07/29/2016   right breast  . Cataract of both eyes   . Chicken pox   . Colon polyps   . GERD (gastroesophageal reflux disease)   . Hyperlipidemia   . Hypertension   . IBS (irritable bowel syndrome)   . Memory disorder 06/22/2017  . Sleep apnea     wears CPAP machine nightly    PAST SURGICAL HISTORY: Past Surgical History:  Procedure Laterality Date  . BACK SURGERY     lower  . BREAST CYST EXCISION Right 2001   negative  . BREAST LUMPECTOMY Right 08/29/2016   negative margins  . BREAST LUMPECTOMY WITH NEEDLE LOCALIZATION AND AXILLARY LYMPH NODE DISSECTION Right 07/29/2016   Procedure: RIGHT BREAST LUMPECTOMY WITH DOUBLE NEEDLE LOCALIZATION AND COMPLETE RIGHT AXILLARY LYMPH NODE DISSECTION;  Surgeon: Fanny Skates, MD;  Location: [facility];  Service: General;  Laterality: Right;  . BREAST SURGERY Left 2000   Biopsy  . BUNIONECTOMY Bilateral 1998   great toe fusion on right foot  . COLONOSCOPY W/ POLYPECTOMY    . HERNIA REPAIR  [id number]   [place] SINUS SURGERY  2015  . PORTACATH PLACEMENT N/A 09/05/2016   Procedure: INSERTION PORT-A-CATH;  Surgeon: Fanny Skates, MD;  Location: [facility];  Service: General;  Laterality: N/A;  . REPLACEMENT TOTAL KNEE Bilateral 2007  . TOTAL SHOULDER REPLACEMENT Left 2007    FAMILY HISTORY: Family History  Problem Relation Age of Onset  . Arthritis Mother   . Stroke Mother   . Hypertension Mother   . Cancer Father        Prostate  . Stroke Father   . Hypertension Father   . Hypertension Maternal Grandmother   . Rheum arthritis Maternal Grandfather   . Stroke Maternal Grandfather   . Hypertension Maternal Grandfather   . Cancer Paternal Grandmother        Colon  . Hypertension Paternal Grandmother   . Hypertension Paternal Grandfather   . Breast cancer Neg Hx     SOCIAL HISTORY: Social History   Socioeconomic History  . Marital status: Married    Spouse name: Not on file  . Number of children: 2  . Years of education: 2 years college  . Highest education level: Not on file  Occupational History  . Occupation: Retired  Scientific laboratory technician  . Financial resource strain: Not on file  . Food insecurity:    Worry: Not on file    Inability: Not on file  . Transportation needs:     Medical: Not on file    Non-medical: Not on file  Tobacco Use  . Smoking status: Former Smoker    Packs/day: 2.00    Years: 25.00    Pack years: 50.00    Last attempt to quit: 12/22/1990    Years since quitting: 27.8  . Smokeless tobacco: Never Used  . Tobacco comment: quit 24 years ago  Substance and Sexual Activity  . Alcohol use: Yes    Comment: rare  . Drug use: No  . Sexual activity: Not Currently  Lifestyle  . Physical activity:    Days per week: Not on file    Minutes per session: Not on file  . Stress: Not on file  Relationships  . Social connections:    Talks on phone: Not on file    Gets together: Not on file    Attends religious service: Not on file    Active member of club or organization: Not on file    Attends meetings of clubs or organizations: Not on file    Relationship status: Not on file  . Intimate partner violence:    Fear of current or ex partner: Not on file    Emotionally abused: Not on file    Physically abused: Not on file    Forced sexual activity: Not on file  Other Topics Concern  . Not on file  Social History Narrative   Lives at home with her husband.   Right-handed.   Occasional caffeine use.     PHYSICAL EXAM  Vitals:   10/05/18 1410  BP: (!) 158/80  Pulse: 66  Weight: 212 lb 9.6 oz (96.4 kg)  Height: 5' (1.524 m)   Body mass index is 41.52 kg/m.  Generalized: Well developed, obese female in no acute distress  Head: normocephalic and atraumatic,. Oropharynx benign  Neck: Supple,  Musculoskeletal: No deformity   Neurological examination   Mentation: Alert  MMSE - Mini Mental State Exam 10/05/2018 04/05/2018 09/25/2017  Not completed: (No Data) - -  Orientation to time 4 4 5   Orientation to Place 3 4 4   Registration 3 3 3   Attention/ Calculation 2 3 0  Recall 0 0 1  Language- name 2 objects 2 2 2   Language- repeat 1 1 1   Language- follow 3 step command 2 2 2   Language- read & follow direction 1 1 1   Write a sentence  0 1 1  Copy design 1 1 1   Total score 19 22 21    Follows all commands speech and language fluent.   Cranial nerve II-XII: Pupils were equal round reactive to light extraocular movements were full, visual field were full on confrontational test. Facial sensation and strength were normal. hearing was intact to finger rubbing bilaterally. Uvula tongue midline. head turning and shoulder shrug were normal and symmetric.Tongue protrusion into cheek strength was normal. Motor: normal bulk and tone, full strength in the BUE, BLE, Sensory: normal and symmetric to light touch,  Coordination: finger-nose-finger, heel-to-shin bilaterally, no dysmetria Reflexes: Symmetric upper and lower, plantar responses were flexor bilaterally. Gait and Station: Rising up from seated position without assistance, wide-based  stance,  moderate stride, ambulates with walker.  Tandem gait not attempted.  Romberg negative  DIAGNOSTIC DATA (LABS, IMAGING, TESTING) - I reviewed patient records, labs, notes, testing and imaging myself where available.  Lab Results  Component Value Date   WBC 5.0 07/22/2018   HGB 13.8 07/22/2018   HCT 42.0 07/22/2018   MCV 92.3 07/22/2018   PLT 221 07/22/2018      Component Value Date/Time   NA 144 07/22/2018 1248   NA 140 09/22/2017 1003   K 4.8 07/22/2018 1248   K 3.8 09/22/2017 1003   CL 104 07/22/2018 1248   CO2 34 (H) 07/22/2018 1248   CO2 29 09/22/2017 1003   GLUCOSE 93 07/22/2018 1248   GLUCOSE 116 09/22/2017 1003   BUN 30 (H) 07/22/2018 1248   BUN 22.4 09/22/2017 1003   CREATININE 0.77 07/22/2018 1248   CREATININE 0.71 07/02/2018 1514   CREATININE 0.8 09/22/2017 1003   CALCIUM 10.5 (H) 07/22/2018 1248   CALCIUM 10.0 09/22/2017 1003   PROT 7.4 07/22/2018 1248   PROT 7.1 09/22/2017 1003  ALBUMIN 3.8 07/22/2018 1248   ALBUMIN 3.1 (L) 09/22/2017 1003   AST 20 07/22/2018 1248   AST 14 09/22/2017 1003   ALT 22 07/22/2018 1248   ALT 9 09/22/2017 1003   ALKPHOS 95  07/22/2018 1248   ALKPHOS 126 09/22/2017 1003   BILITOT 0.4 07/22/2018 1248   BILITOT 0.24 09/22/2017 1003   GFRNONAA >60 07/22/2018 1248   GFRAA >60 07/22/2018 1248   Lab Results  Component Value Date   CHOL 208 (H) 07/02/2018   HDL 47 (L) 07/02/2018   LDLCALC 130 (H) 07/02/2018   LDLDIRECT 113.0 12/03/2016   TRIG 176 (H) 07/02/2018   CHOLHDL 4.4 07/02/2018   Lab Results  Component Value Date   HGBA1C 5.6 07/02/2018   Lab Results  Component Value Date   VITAMINB12 1,443 (H) 04/27/2017   Lab Results  Component Value Date   TSH 0.656 04/27/2017      ASSESSMENT AND PLAN  77 y.o. year old female  has a past medical history of Hyperlipidemia, Hypertension, IBS (irritable bowel syndrome), Memory disorder (06/22/2017), and Sleep apnea. here to follow-up for her memory disturbance.  She is complaining with more word finding issues.    PLAN: Will begin Namenda titration for memory issues Due to diarrhea would not start Aricept. Do puzzles strategy games word search Stick to her routine no multitasking Follow up in 6 months Dennie Bible, [facility], [facility] [address] [place], [place] [address] [phone]

## 2018-10-05 ENCOUNTER — Encounter: Payer: Self-pay | Admitting: Nurse Practitioner

## 2018-10-05 ENCOUNTER — Ambulatory Visit: Payer: Medicare PPO | Admitting: Nurse Practitioner

## 2018-10-05 VITALS — BP 158/80 | HR 66 | Ht 60.0 in | Wt 212.6 lb

## 2018-10-05 DIAGNOSIS — R413 Other amnesia: Secondary | ICD-10-CM

## 2018-10-05 DIAGNOSIS — R269 Unspecified abnormalities of gait and mobility: Secondary | ICD-10-CM | POA: Diagnosis not present

## 2018-10-05 MED ORDER — MEMANTINE HCL 28 X 5 MG & 21 X 10 MG PO TABS: ORAL_TABLET | ORAL | 0 refills | Status: DC

## 2018-10-05 NOTE — Progress Notes (Signed)
I have read the note, and I agree with the clinical assessment and plan.  Casidy Alberta K Evelean Bigler   

## 2018-10-05 NOTE — Patient Instructions (Signed)
Will begin Namenda titration for memory issues Due to diarrhea would not start Aricept. Also with puzzles strategy games etc. Stick to a routine no multitasking Follow up in 6 months

## 2019-01-05 ENCOUNTER — Emergency Department: Payer: Medicare PPO

## 2019-01-05 ENCOUNTER — Other Ambulatory Visit: Payer: Self-pay

## 2019-01-05 ENCOUNTER — Emergency Department
Admission: EM | Admit: 2019-01-05 | Discharge: 2019-01-05 | Disposition: A | Discharge status: Home / Self Care | Payer: Medicare PPO | Attending: Emergency Medicine | Admitting: Emergency Medicine

## 2019-01-05 DIAGNOSIS — Y999 Unspecified external cause status: Secondary | ICD-10-CM | POA: Diagnosis not present

## 2019-01-05 DIAGNOSIS — I10 Essential (primary) hypertension: Secondary | ICD-10-CM | POA: Insufficient documentation

## 2019-01-05 DIAGNOSIS — W19XXXA Unspecified fall, initial encounter: Secondary | ICD-10-CM

## 2019-01-05 DIAGNOSIS — Y939 Activity, unspecified: Secondary | ICD-10-CM | POA: Insufficient documentation

## 2019-01-05 DIAGNOSIS — S42342A Displaced spiral fracture of shaft of humerus, left arm, initial encounter for closed fracture: Secondary | ICD-10-CM

## 2019-01-05 DIAGNOSIS — Y929 Unspecified place or not applicable: Secondary | ICD-10-CM | POA: Diagnosis not present

## 2019-01-05 DIAGNOSIS — S0990XA Unspecified injury of head, initial encounter: Secondary | ICD-10-CM | POA: Diagnosis not present

## 2019-01-05 DIAGNOSIS — Z853 Personal history of malignant neoplasm of breast: Secondary | ICD-10-CM | POA: Diagnosis not present

## 2019-01-05 DIAGNOSIS — W01198A Fall on same level from slipping, tripping and stumbling with subsequent striking against other object, initial encounter: Secondary | ICD-10-CM | POA: Insufficient documentation

## 2019-01-05 DIAGNOSIS — S42309A Unspecified fracture of shaft of humerus, unspecified arm, initial encounter for closed fracture: Secondary | ICD-10-CM

## 2019-01-05 DIAGNOSIS — S4992XA Unspecified injury of left shoulder and upper arm, initial encounter: Secondary | ICD-10-CM | POA: Diagnosis present

## 2019-01-05 DIAGNOSIS — Z79899 Other long term (current) drug therapy: Secondary | ICD-10-CM | POA: Diagnosis not present

## 2019-01-05 DIAGNOSIS — Z87891 Personal history of nicotine dependence: Secondary | ICD-10-CM | POA: Diagnosis not present

## 2019-01-05 HISTORY — DX: Unspecified fracture of shaft of humerus, unspecified arm, initial encounter for closed fracture: S42.309A

## 2019-01-05 MED ORDER — OXYCODONE HCL 5 MG PO TABS
5.0000 mg | ORAL_TABLET | Freq: Once | ORAL | Status: AC
  Administered 2019-01-05: 5 mg via ORAL
  Filled 2019-01-05: qty 1

## 2019-01-05 MED ORDER — ACETAMINOPHEN 500 MG PO TABS
1000.0000 mg | ORAL_TABLET | Freq: Once | ORAL | Status: AC
  Administered 2019-01-05: 1000 mg via ORAL
  Filled 2019-01-05: qty 2

## 2019-01-05 MED ORDER — OXYCODONE HCL 5 MG PO TABS: 5.0000 mg | ORAL_TABLET | Freq: Three times a day (TID) | ORAL | 0 refills | Status: DC | PRN

## 2019-01-05 NOTE — ED Triage Notes (Signed)
PT BIB EMS from home for mechanical fall onto left side. PT reporting L shoulder pain, no obvious deformity. Pt reports hitting head, denies any LOC or use of blood thinners. HTN in triage.

## 2019-01-05 NOTE — ED Notes (Addendum)
Patient transported to X-ray and then to CT.  

## 2019-01-05 NOTE — ED Notes (Signed)
Pt and husband verbalized understanding of d/c instructions, use of sling, rx, and f/u care. No further questions at this time. Pt assisted to exit via wheelchair.

## 2019-01-05 NOTE — ED Notes (Signed)
Pt remains off the floor at this time in imaging at this time. Pts husband at the bedside this RN introduced to Husband and updated him on pt status. No needs at this time.

## 2019-01-05 NOTE — ED Notes (Signed)
Registration at bedside at this time.

## 2019-01-05 NOTE — Discharge Instructions (Signed)
Keep your arm in the sling at all times until cleared by your orthopedic surgeon.  At nighttime prop herself up to sleep and leave the arm hanging in the sling.  If you have not heard from Dr. Clydell Hakim office tomorrow until lunchtime make sure to call for close follow-up.  As I explained to you you will need surgery to fix your arm.    Pain control: Take tylenol 1000mg  every 8 hours. Take 5mg  of oxycodone every 6 hours for breakthrough pain. If you need the oxycodone make sure to take one senokot as well to prevent constipation.  Do not drink alcohol, drive or participate in any other potentially dangerous activities while taking this medication as it may make you sleepy. Do not take this medication with any other sedating medications, either prescription or over-the-counter.

## 2019-01-05 NOTE — ED Provider Notes (Signed)
[facility] Emergency Department Provider Note  ____________________________________________  Time seen: Approximately 7:44 PM  I have reviewed the triage vital signs and the nursing notes.   HISTORY  Chief Complaint Fall and Shoulder Pain   HPI Tiffany Arnold is a 78 y.o. female with a history of recurrent falls, hypertension, hyperlipidemia, OSA on CPAP, diabetes who presents for evaluation of mechanical fall.  Patient reports that she parked her walker outside of the bathroom and as she was coming out of it she tripped on some steps and fell onto her left side.  She is complaining of 10 out of 10 sharp pain located in her left shoulder has been constant since the fall.  She endorses head trauma but no LOC and she is not on blood thinners.  She is also complaining of mild right knee pain.  She denies headache, neck pain, chest pain, back pain, abdominal pain, hip pain.  Patient reports that the fall was mechanical in nature with no presyncopal symptoms.   Past Medical History:  Diagnosis Date  . Allergy   . Arthritis   . Cancer ([place]) 07/29/2016   right breast  . Cataract of both eyes   . Chicken pox   . Colon polyps   . GERD (gastroesophageal reflux disease)   . Hyperlipidemia   . Hypertension   . IBS (irritable bowel syndrome)   . Memory disorder 06/22/2017  . Sleep apnea    wears CPAP machine nightly    Patient Active Problem List   Diagnosis Date Noted  . Gait abnormality 10/05/2018  . Iron deficiency anemia 07/08/2018  . Recurrent UTI 01/01/2018  . Osteoarthritis 01/01/2018  . Memory disorder 06/22/2017  . Osteoporosis 04/01/2017  . Port catheter in place 12/25/2016  . IBS (irritable bowel syndrome) 11/25/2016  . Breast cancer of lower-inner quadrant of right female breast ([place]) 06/27/2016  . OSA on CPAP 06/26/2016  . Type 2 diabetes mellitus without complication ([place]) 28/78/6767  . Essential hypertension 09/04/2014  . HLD  (hyperlipidemia) 09/04/2014  . Gastroesophageal reflux disease without esophagitis 09/04/2014  . Seasonal allergies 09/04/2014  . Generalized anxiety disorder 09/04/2014    Past Surgical History:  Procedure Laterality Date  . BACK SURGERY     lower  . BREAST CYST EXCISION Right 2001   negative  . BREAST LUMPECTOMY Right 08/29/2016   negative margins  . BREAST LUMPECTOMY WITH NEEDLE LOCALIZATION AND AXILLARY LYMPH NODE DISSECTION Right 07/29/2016   Procedure: RIGHT BREAST LUMPECTOMY WITH DOUBLE NEEDLE LOCALIZATION AND COMPLETE RIGHT AXILLARY LYMPH NODE DISSECTION;  Surgeon: Fanny Skates, MD;  Location: [facility];  Service: General;  Laterality: Right;  . BREAST SURGERY Left 2000   Biopsy  . BUNIONECTOMY Bilateral 1998   great toe fusion on right foot  . COLONOSCOPY W/ POLYPECTOMY    . HERNIA REPAIR  2094   [place] SINUS SURGERY  2015  . PORTACATH PLACEMENT N/A 09/05/2016   Procedure: INSERTION PORT-A-CATH;  Surgeon: Fanny Skates, MD;  Location: [facility];  Service: General;  Laterality: N/A;  . REPLACEMENT TOTAL KNEE Bilateral 2007  . TOTAL SHOULDER REPLACEMENT Left 2007    Prior to Admission medications   Medication Sig Start Date End Date Taking? Authorizing Provider  alendronate (FOSAMAX) 70 MG tablet TAKE 1 TABLET EVERY 7 DAYS WITH A FULL GLASS OF WATER ON AN EMPTY STOMACH DO NOT LIE DOWN FOR AT LEAST 30 MIN 06/17/18   Alla Feeling, NP  ALPRAZolam Duanne Moron) 0.25 MG  tablet TAKE ONE TABLET BY MOUTH TWICE DAILY AS NEEDED 11/26/16   [name], NP  Calcium Carbonate-Vitamin D (CALCIUM-VITAMIN D) 500-200 MG-UNIT tablet Take 1 tablet by mouth daily.    [provider]  diphenoxylate-atropine (LOMOTIL) 2.5-0.025 MG tablet Take 1 tablet by mouth 4 (four) times daily as needed for diarrhea or loose stools. 08/25/18   [name], NP  EPINEPHrine 0.3 mg/0.3 mL IJ SOAJ injection Inject 0.3 mg into the muscle once as needed (ALLERGIC REACTION).     [provider]  exemestane (AROMASIN) 25 MG tablet Take 1 tablet (25 mg total) by mouth daily. 07/22/18   Alla Feeling, NP  ferrous sulfate 325 (65 FE) MG tablet Take 325 mg by mouth daily with breakfast.    [provider]  lisinopril (PRINIVIL,ZESTRIL) 10 MG tablet TAKE ONE TABLET BY MOUTH EVERY DAY 08/27/18   [name], NP  memantine [facility] TITRATION PACK) tablet pack Take by mouth See admin instructions. 5 mg/day for =1 week; 5 mg twice daily for =1 week; 15 mg/day given in 5 mg and 10 mg separated doses for =1 week; then 10 mg twice daily Call for refill after titration pack concluded 10/05/18   Dennie Bible, NP  Methylcellulose, Laxative, (CITRUCEL) 500 MG TABS Take 2 tablets by mouth 3 (three) times daily.    [provider]  nitrofurantoin (MACRODANTIN) 100 MG capsule Take 1 capsule by mouth daily. 01/21/18   [provider]  nystatin (MYCOSTATIN/NYSTOP) powder Apply topically 3 (three) times daily. 07/22/18   Alla Feeling, NP  oxyCODONE (ROXICODONE) 5 MG immediate release tablet Take 1 tablet (5 mg total) by mouth every 8 (eight) hours as needed. 01/05/19 01/05/20  Rudene Re, MD  Probiotic Product (PROBIOTIC PO) Take 1 Dose by mouth daily.    [provider]  sertraline (ZOLOFT) 50 MG tablet Take 1 tablet by mouth daily. 06/30/18   [provider]  traMADol (ULTRAM) 50 MG tablet Take 1 tablet (50 mg total) by mouth every 8 (eight) hours as needed. 06/22/17   Tonia Ghent, MD    Allergies Other  Family History  Problem Relation Age of Onset  . Arthritis Mother   . Stroke Mother   . Hypertension Mother   . Cancer Father        Prostate  . Stroke Father   . Hypertension Father   . Hypertension Maternal Grandmother   . Rheum arthritis Maternal Grandfather   . Stroke Maternal Grandfather   . Hypertension Maternal Grandfather   . Cancer Paternal Grandmother        Colon  . Hypertension Paternal Grandmother   .  Hypertension Paternal Grandfather   . Breast cancer Neg Hx     Social History Social History   Tobacco Use  . Smoking status: Former Smoker    Packs/day: 2.00    Years: 25.00    Pack years: 50.00    Last attempt to quit: 12/22/1990    Years since quitting: 28.0  . Smokeless tobacco: Never Used  . Tobacco comment: quit 24 years ago  Substance Use Topics  . Alcohol use: Yes    Comment: rare  . Drug use: No    Review of Systems Constitutional: Negative for fever. Eyes: Negative for visual changes. ENT: + facial bruising. No neck injury Cardiovascular: Negative for chest injury. Respiratory: Negative for shortness of breath. Negative for chest wall injury. Gastrointestinal: Negative for abdominal pain or injury. Genitourinary: Negative  for dysuria. Musculoskeletal: Negative for back injury, + R knee pain and L shoulder pain Skin: Negative for laceration/abrasions. Neurological: Negative for head injury.   ____________________________________________   PHYSICAL EXAM:  VITAL SIGNS: ED Triage Vitals  Enc Vitals Group     BP 01/05/19 1903 (!) 194/89     Pulse Rate 01/05/19 1903 75     Resp 01/05/19 1903 16     Temp 01/05/19 1903 98.2 F (36.8 C)     Temp Source 01/05/19 1903 Oral     SpO2 01/05/19 1903 96 %     Weight 01/05/19 1907 200 lb (90.7 kg)     Height 01/05/19 1907 5' (1.524 m)     Head Circumference --      Peak Flow --      Pain Score 01/05/19 1907 10     Pain Loc --      Pain Edu? --      Excl. in [place]? --     Constitutional: Alert and oriented. No acute distress. Does not appear intoxicated. HEENT Head: Normocephalic and atraumatic. Face: No facial bony tenderness. Stable midface.  Bruising of the forehead and left cheek Ears: No hemotympanum bilaterally. No Battle sign Eyes: No eye injury. PERRL. No raccoon eyes Nose: Bruising noted on the nose no epistaxis. No rhinorrhea Mouth/Throat: Mucous membranes are moist. No oropharyngeal blood. No dental  injury. Airway patent without stridor. Normal voice. Neck: no C-collar in place. No midline c-spine tenderness.  Cardiovascular: Normal rate, regular rhythm. Normal and symmetric distal pulses are present in all extremities. Pulmonary/Chest: Chest wall is stable and nontender to palpation/compression. Normal respiratory effort. Breath sounds are normal. No crepitus.  Abdominal: Soft, nontender, non distended. Musculoskeletal: Abrasion of the right knee.  Patient is tender to palpation over the proximal humerus/anterior shoulder region on the left.  Nontender with normal full range of motion in all other extremities. No deformities. No thoracic or lumbar midline spinal tenderness. Pelvis is stable. Skin: Skin is warm, dry and intact. No abrasions or contutions. Psychiatric: Speech and behavior are appropriate. Neurological: Normal speech and language. Moves all extremities to command. No gross focal neurologic deficits are appreciated.  Glascow Coma Score: 4 - Opens eyes on own 6 - Follows simple motor commands 5 - Alert and oriented GCS: 15   ____________________________________________   LABS (all labs ordered are listed, but only abnormal results are displayed)  Labs Reviewed - No data to display ____________________________________________  EKG  none  ____________________________________________  RADIOLOGY  I have personally reviewed the images performed during this visit and I agree with the Radiologist's read.   Interpretation by Radiologist:  Ct Head Wo Contrast  Result Date: 01/05/2019 CLINICAL DATA:  Mechanical fall to the left side. Struck head. No loss of consciousness. No blood thinners. EXAM: CT HEAD WITHOUT CONTRAST CT MAXILLOFACIAL WITHOUT CONTRAST CT CERVICAL SPINE WITHOUT CONTRAST TECHNIQUE: Multidetector CT imaging of the head, cervical spine, and maxillofacial structures were performed using the standard protocol without intravenous contrast. Multiplanar CT  image reconstructions of the cervical spine and maxillofacial structures were also generated. COMPARISON:  MRI brain 07/06/2017. CT head 07/06/2017. CT sinuses 11/23/2010 FINDINGS: CT HEAD FINDINGS Brain: Diffuse cerebral atrophy. Mild ventricular dilatation consistent with central atrophy. Low-attenuation changes in the deep white matter consistent small vessel ischemia. No change in pattern since previous study. No mass-effect or midline shift. No abnormal extra-axial fluid collections. Gray-white matter junctions are distinct. Basal cisterns are not effaced. No acute intracranial hemorrhage. Vascular: Moderate intracranial  arterial vascular calcifications. Skull: Calvarium appears intact. No acute depressed skull fractures. Other: None. CT MAXILLOFACIAL FINDINGS Osseous: Postoperative changes with bilateral antrectomies and turbinate resections. Degenerative changes in the temporomandibular joints, greater on the right. No acute fracture or dislocation demonstrated in the orbital, facial, or mandibular bones. Orbits: The globes and extraocular muscles appear intact and symmetrical. Sinuses: Mucosal thickening in the maxillary antra bilaterally. No acute air-fluid levels. Mastoid air cells are clear. Soft tissues: No significant soft tissue swelling, gas, or foreign body. Vascular calcifications. CT CERVICAL SPINE FINDINGS Alignment: Motion artifact limits examination. Alignment of the cervical spine and facet joints appears normal. C1-2 articulation appears intact. Skull base and vertebrae: Skull base appears intact. No vertebral compression deformities. No focal bone lesion or bone destruction. Bone cortex appears intact. Soft tissues and spinal canal: No prevertebral soft tissue swelling. No abnormal paraspinal soft tissue mass or infiltration. Disc levels: Degenerative changes throughout the cervical spine with narrowed disc spaces and endplate hypertrophic changes. Degenerative changes in the facet joints.  Upper chest: Motion artifact. Lung apices appear clear. Vascular calcifications. Other: None. IMPRESSION: 1. No acute intracranial abnormalities. Chronic atrophy and small vessel ischemic changes. 2. No acute displaced orbital or facial fractures identified. Postoperative changes in the sinuses. Degenerative changes of the temporomandibular joints. 3. Normal alignment of the cervical spine. Diffuse degenerative changes. No acute displaced fractures identified. Electronically Signed   By: Lucienne Capers M.D.   On: 01/05/2019 20:36   Ct Cervical Spine Wo Contrast  Result Date: 01/05/2019 CLINICAL DATA:  Mechanical fall to the left side. Struck head. No loss of consciousness. No blood thinners. EXAM: CT HEAD WITHOUT CONTRAST CT MAXILLOFACIAL WITHOUT CONTRAST CT CERVICAL SPINE WITHOUT CONTRAST TECHNIQUE: Multidetector CT imaging of the head, cervical spine, and maxillofacial structures were performed using the standard protocol without intravenous contrast. Multiplanar CT image reconstructions of the cervical spine and maxillofacial structures were also generated. COMPARISON:  MRI brain 07/06/2017. CT head 07/06/2017. CT sinuses 11/23/2010 FINDINGS: CT HEAD FINDINGS Brain: Diffuse cerebral atrophy. Mild ventricular dilatation consistent with central atrophy. Low-attenuation changes in the deep white matter consistent small vessel ischemia. No change in pattern since previous study. No mass-effect or midline shift. No abnormal extra-axial fluid collections. Gray-white matter junctions are distinct. Basal cisterns are not effaced. No acute intracranial hemorrhage. Vascular: Moderate intracranial arterial vascular calcifications. Skull: Calvarium appears intact. No acute depressed skull fractures. Other: None. CT MAXILLOFACIAL FINDINGS Osseous: Postoperative changes with bilateral antrectomies and turbinate resections. Degenerative changes in the temporomandibular joints, greater on the right. No acute fracture or  dislocation demonstrated in the orbital, facial, or mandibular bones. Orbits: The globes and extraocular muscles appear intact and symmetrical. Sinuses: Mucosal thickening in the maxillary antra bilaterally. No acute air-fluid levels. Mastoid air cells are clear. Soft tissues: No significant soft tissue swelling, gas, or foreign body. Vascular calcifications. CT CERVICAL SPINE FINDINGS Alignment: Motion artifact limits examination. Alignment of the cervical spine and facet joints appears normal. C1-2 articulation appears intact. Skull base and vertebrae: Skull base appears intact. No vertebral compression deformities. No focal bone lesion or bone destruction. Bone cortex appears intact. Soft tissues and spinal canal: No prevertebral soft tissue swelling. No abnormal paraspinal soft tissue mass or infiltration. Disc levels: Degenerative changes throughout the cervical spine with narrowed disc spaces and endplate hypertrophic changes. Degenerative changes in the facet joints. Upper chest: Motion artifact. Lung apices appear clear. Vascular calcifications. Other: None. IMPRESSION: 1. No acute intracranial abnormalities. Chronic atrophy and small vessel ischemic changes.  2. No acute displaced orbital or facial fractures identified. Postoperative changes in the sinuses. Degenerative changes of the temporomandibular joints. 3. Normal alignment of the cervical spine. Diffuse degenerative changes. No acute displaced fractures identified. Electronically Signed   By: Lucienne Capers M.D.   On: 01/05/2019 20:36   Dg Shoulder Left  Result Date: 01/05/2019 CLINICAL DATA:  Fall on the left side. Left shoulder pain. History of a left shoulder replacement. EXAM: LEFT SHOULDER - 2+ VIEW COMPARISON:  CT, 03/18/2006 FINDINGS: There is an acute fracture of the proximal humerus, extending from adjacent to the medullary stem of the shoulder prosthesis distally, below the included field of view. Distal fracture component is displaced  medially by 1 full shaft width. No other fractures. Left shoulder prosthesis appears well aligned. AC joint is normally aligned. Bones are diffusely demineralized. IMPRESSION: 1. Fracture of the left humeral diaphysis. 2. No left shoulder fracture or dislocation. Electronically Signed   By: Lajean Manes M.D.   On: 01/05/2019 19:56   Dg Knee Complete 4 Views Right  Result Date: 01/05/2019 CLINICAL DATA:  Fall onto the left side.  Right knee pain. EXAM: RIGHT KNEE - COMPLETE 4+ VIEW COMPARISON:  Postop right knee radiographs, 41660 FINDINGS: No fracture or bone lesion. Knee prosthetic components are well-seated and aligned. No evidence of loosening. No joint effusion. IMPRESSION: 1. No fracture or dislocation. 2. No evidence of loosening of the orthopedic hardware. Electronically Signed   By: Lajean Manes M.D.   On: 01/05/2019 19:57   Dg Humerus Left  Result Date: 01/05/2019 CLINICAL DATA:  Fall onto the left arm pain.  Side.  Left EXAM: LEFT HUMERUS - 2+ VIEW COMPARISON:  None. FINDINGS: Oblique to spiral fracture of the left humeral diaphysis. Fracture begins along the proximal, medial shaft, adjacent to the intramedullary component of the left shoulder prosthesis. Fracture extends below the tip of the prosthesis to the mid humeral shaft. There is a linear nondisplaced component of the fracture across the midshaft. No fracture comminution. Primary fracture components are displaced, distal fracture component displacing medially by 2.2 cm. Fractures also angulated, distal fracture component angulated posteriorly by 24 degrees. Shoulder prosthesis remains normally aligned. Elbow joint is normally aligned. There is surrounding soft tissue swelling. IMPRESSION: Displaced, angulated fracture of the left humeral diaphysis as described. Electronically Signed   By: Lajean Manes M.D.   On: 01/05/2019 20:00   Ct Maxillofacial Wo Contrast  Result Date: 01/05/2019 CLINICAL DATA:  Mechanical fall to the left side.  Struck head. No loss of consciousness. No blood thinners. EXAM: CT HEAD WITHOUT CONTRAST CT MAXILLOFACIAL WITHOUT CONTRAST CT CERVICAL SPINE WITHOUT CONTRAST TECHNIQUE: Multidetector CT imaging of the head, cervical spine, and maxillofacial structures were performed using the standard protocol without intravenous contrast. Multiplanar CT image reconstructions of the cervical spine and maxillofacial structures were also generated. COMPARISON:  MRI brain 07/06/2017. CT head 07/06/2017. CT sinuses 11/23/2010 FINDINGS: CT HEAD FINDINGS Brain: Diffuse cerebral atrophy. Mild ventricular dilatation consistent with central atrophy. Low-attenuation changes in the deep white matter consistent small vessel ischemia. No change in pattern since previous study. No mass-effect or midline shift. No abnormal extra-axial fluid collections. Gray-white matter junctions are distinct. Basal cisterns are not effaced. No acute intracranial hemorrhage. Vascular: Moderate intracranial arterial vascular calcifications. Skull: Calvarium appears intact. No acute depressed skull fractures. Other: None. CT MAXILLOFACIAL FINDINGS Osseous: Postoperative changes with bilateral antrectomies and turbinate resections. Degenerative changes in the temporomandibular joints, greater on the right. No acute fracture or dislocation  demonstrated in the orbital, facial, or mandibular bones. Orbits: The globes and extraocular muscles appear intact and symmetrical. Sinuses: Mucosal thickening in the maxillary antra bilaterally. No acute air-fluid levels. Mastoid air cells are clear. Soft tissues: No significant soft tissue swelling, gas, or foreign body. Vascular calcifications. CT CERVICAL SPINE FINDINGS Alignment: Motion artifact limits examination. Alignment of the cervical spine and facet joints appears normal. C1-2 articulation appears intact. Skull base and vertebrae: Skull base appears intact. No vertebral compression deformities. No focal bone lesion or  bone destruction. Bone cortex appears intact. Soft tissues and spinal canal: No prevertebral soft tissue swelling. No abnormal paraspinal soft tissue mass or infiltration. Disc levels: Degenerative changes throughout the cervical spine with narrowed disc spaces and endplate hypertrophic changes. Degenerative changes in the facet joints. Upper chest: Motion artifact. Lung apices appear clear. Vascular calcifications. Other: None. IMPRESSION: 1. No acute intracranial abnormalities. Chronic atrophy and small vessel ischemic changes. 2. No acute displaced orbital or facial fractures identified. Postoperative changes in the sinuses. Degenerative changes of the temporomandibular joints. 3. Normal alignment of the cervical spine. Diffuse degenerative changes. No acute displaced fractures identified. Electronically Signed   By: Lucienne Capers M.D.   On: 01/05/2019 20:36      ____________________________________________   PROCEDURES  Procedure(s) performed: None Procedures Critical Care performed:  None ____________________________________________   INITIAL IMPRESSION / ASSESSMENT AND PLAN / ED COURSE   78 y.o. female with a history of recurrent falls, hypertension, hyperlipidemia, OSA on CPAP, diabetes who presents for evaluation of mechanical fall and is complaining of severe pain in her left shoulder/proximal humerus area and mild pain in her right knee.  Patient has a right knee abrasion, no obvious deformity of the left shoulder or left upper extremity, she has abrasion to the left forehead and cheek and also at the bridge of the nose.  Neck is atraumatic with no midline tenderness, no CT and L-spine tenderness.  Patient is not on blood thinners.  Otherwise neurologically intact.  CT head, C-spine and maxillofacial was pending, x-rays of the shoulder and knee are also pending.  Tetanus shot is up-to-date.  Clinical Course as of Jan 05 2043  Wed Jan 05, 2019  2030 X-ray of the humerus confirms a  mid humeral spiral displaced fracture.  Vascular exam of that extremity is within normal limits.  Discussed with Dr. Marry Guan who is patient's orthopedic surgeon.  He looked at the x-ray and recommended putting patient on dependent sling, pain control and outpatient follow-up for surgical repair.  CTs are pending.  Discussed this recommendation with the patient.  She reports that her pain is markedly improved after Tylenol and oxycodone.   [CV]    Clinical Course User Index [CV] Alfred Levins [place], MD     As part of my medical decision making, I reviewed the following data within the [facility] notes reviewed and incorporated, Old chart reviewed, Radiograph reviewed , A consult was requested and obtained from this/these consultant(s) Orthopedics, Notes from prior ED visits and [place] Controlled Substance Database    Pertinent labs & imaging results that were available during my care of the patient were reviewed by me and considered in my medical decision making (see chart for details).    ____________________________________________   FINAL CLINICAL IMPRESSION(S) / ED DIAGNOSES  Final diagnoses:  Fall, initial encounter  Closed displaced spiral fracture of shaft of left humerus, initial encounter      NEW MEDICATIONS STARTED DURING THIS VISIT:  ED Discharge  Orders         Ordered    oxyCODONE (ROXICODONE) 5 MG immediate release tablet  Every 8 hours PRN     01/05/19 2043           Note:  This document was prepared using Dragon voice recognition software and may include unintentional dictation errors.    Rudene Re, MD 01/05/19 2044

## 2019-01-19 ENCOUNTER — Other Ambulatory Visit: Payer: Self-pay | Admitting: Nurse Practitioner

## 2019-01-19 NOTE — Progress Notes (Signed)
[name]   [phone] [phone]   Clinic Follow up Note   Patient Care Team: [name], NP as PCP - General (Internal Medicine) 01/20/2019  CHIEF COMPLAINT: F/u on right breast cancer  SUMMARY OF ONCOLOGIC HISTORY: Oncology History   Breast cancer of lower-inner quadrant of right female breast [facility])   Staging form: Breast, AJCC 7th Edition   - Clinical stage from 06/09/2016: Stage IIB (T2, N1, M0) - Signed by Truitt Merle, MD on 06/27/2016   - Pathologic stage from 07/29/2016: Stage IIA (T1c, N1a, cM0) - Signed by Truitt Merle, MD on 08/13/2016       Breast cancer of lower-inner quadrant of right female breast ([place])   05/23/2016 Mammogram    Diagnostic mammogram and ultrasound showed suspicious architectural distortion within the right breast lower inner quadrant, measuring 1.3 cm, without sonographic corelate.    06/03/2016 Initial Biopsy    Right breast inferior lower quadrant core needle biopsy showed atypical ductal hyperplasia with calcifications    06/09/2016 Receptors her2    Breast biopsy showed ER 100% positive, PR 100% positive, HER-2 negative, Ki-67 40%    06/09/2016 Initial Diagnosis    Breast cancer of lower-inner quadrant of right female breast ([place])    06/09/2016 Initial Biopsy    Right breast inner quadrant core needle biopsy showed invasive ductal carcinoma and DCIS, grade 1-2    06/17/2016 Initial Biopsy    Right axillary lymph node core needle biopsy showed metastatic carcinoma    06/17/2016 Receptors her2    Axillary node biopsy showed ER 100% positive, PR 95% positive, HER-2 negative    06/19/2016 Imaging    Bilateral breast MRI showed locations in the lower inner right breast, largest 2.7X1.3X1.6cm, biopsy clips in 2 of this areas. There are abnormal right axillary lymph nodes showing second cortical's, no evidence of malignancy in the left breast.    07/29/2016 Surgery    Right lumpectomy and ALND    07/29/2016 Pathology  Results    Right lumpectomy showed G3 IDC, DCIS, margins (-), LVI(-).  1 of 16 nodes was positive     07/29/2016 Miscellaneous    Mammaprint showed high risk disease, luminal type B    09/09/2016 - 11/12/2016 Adjuvant Chemotherapy    Docetaxel and Cytoxan (TC) every 3 weeks    12/24/2016 - 02/03/2017 Radiation Therapy    Adjuvant breast radiation 12/24/16 - 02/03/17 : Right Breast treated to 50.4 Gy in 28 fractions. Right Axilla treated to 45 Gy in 25 fractions.    02/17/2017 -  Anti-estrogen oral therapy    Letrozole 2.5 mg daily     03/23/2017 Imaging    DG Bone Density 03/23/17 ASSESSMENT: The BMD measured at Femur Neck Right is 0.809 g/cm2 with a T-score of -1.6. This patient is considered osteopenic according to [facility]) criteria.    09/08/2017 Mammogram    Diagnostic Mammogram 09/08/17  IMPRESSION: No mammographic evidence of malignancy in either breast, status post right lumpectomy. RECOMMENDATION: Diagnostic mammogram is suggested in 1 year. (Code:DM-B-01Y)    09/09/2018 Mammogram    09/10/2019 Mammogram IMPRESSION: No mammographic evidence of malignancy.     CURRENT THERAPY Exemestanestarted on10/16/18    INTERVAL HISTORY: Tiffany Arnold is a 78 y.o. female who is here for follow-up. Since our last visit, she had a mammogram with benign results. Today, she is here with a a family member. She fell and broke her left arm. Her left arm is full of  bruises from her elbow to fingers. The bruises are  improving. She will get surgery soon for her broken arm because is not healing. Before the fall she did not have any difficulty walking around the house. She is on a wheel chair. Pt is doing well but she describes difficulty speaking, has confusion problems, right breast pain, vaginal irritation and diarrhea. She uses Lomotil for diarrhea.Denies stomach pain.    Pertinent positives and negatives of review of systems are listed and detailed within the above  HPI.  REVIEW OF SYSTEMS: Constitutional: Denies fevers, chills or abnormal weight loss, (+) broken left arm  Eyes: Denies blurriness of vision Ears, nose, mouth, throat, and face: Denies mucositis or sore throat Respiratory: Denies cough, dyspnea or wheezes Cardiovascular: Denies palpitation, chest discomfort or lower extremity swelling Gastrointestinal:  Denies nausea, heartburn or change in bowel habits GU: (+) vaginal irritation  Skin: Denies abnormal skin rashes, (+) left arm bruises  Lymphatics: Denies new lymphadenopathy or easy bruising Neurological:Denies numbness, tingling or new weaknesses Behavioral/Psych: Mood is stable, no new changes  Breast: (+) right breast pain  All other systems were reviewed with the patient and are negative.  MEDICAL HISTORY:  Past Medical History:  Diagnosis Date  . Allergy   . Arthritis   . Cancer ([place]) 07/29/2016   right breast  . Cataract of both eyes   . Chicken pox   . Colon polyps   . GERD (gastroesophageal reflux disease)   . Hyperlipidemia   . Hypertension   . IBS (irritable bowel syndrome)   . Memory disorder 06/22/2017  . Sleep apnea    wears CPAP machine nightly    SURGICAL HISTORY: Past Surgical History:  Procedure Laterality Date  . BACK SURGERY     lower  . BREAST CYST EXCISION Right 2001   negative  . BREAST LUMPECTOMY Right 08/29/2016   negative margins  . BREAST LUMPECTOMY WITH NEEDLE LOCALIZATION AND AXILLARY LYMPH NODE DISSECTION Right 07/29/2016   Procedure: RIGHT BREAST LUMPECTOMY WITH DOUBLE NEEDLE LOCALIZATION AND COMPLETE RIGHT AXILLARY LYMPH NODE DISSECTION;  Surgeon: Fanny Skates, MD;  Location: [place];  Service: General;  Laterality: Right;  . BREAST SURGERY Left 2000   Biopsy  . BUNIONECTOMY Bilateral 1998   great toe fusion on right foot  . COLONOSCOPY W/ POLYPECTOMY    . HERNIA REPAIR  [id number]   [place] SINUS SURGERY  2015  . PORTACATH PLACEMENT N/A 09/05/2016   Procedure: INSERTION  PORT-A-CATH;  Surgeon: Fanny Skates, MD;  Location: [facility];  Service: General;  Laterality: N/A;  . REPLACEMENT TOTAL KNEE Bilateral 2007  . TOTAL SHOULDER REPLACEMENT Left 2007    I have reviewed the social history and family history with the patient and they are unchanged from previous note.  ALLERGIES:  is allergic to other.  MEDICATIONS:  Current Outpatient Medications  Medication Sig Dispense Refill  . acetaminophen (TYLENOL) 325 MG tablet Take 650 mg by mouth every 6 (six) hours as needed (pain).    Marland Kitchen alendronate (FOSAMAX) 70 MG tablet TAKE 1 TABLET EVERY 7 DAYS WITH A FULL GLASS OF WATER ON AN EMPTY STOMACH DO NOT LIE DOWN FOR AT LEAST 30 MIN (Patient taking differently: Take 70 mg by mouth every Tuesday. ) 4 tablet 5  . ALPRAZolam (XANAX) 0.25 MG tablet TAKE ONE TABLET BY MOUTH TWICE DAILY AS NEEDED (Patient taking differently: Take 0.25 mg by mouth 2 (two) times daily as needed for anxiety. ) 60 tablet 0  .  Calcium Carbonate-Vitamin D (CALCIUM-VITAMIN D) 500-200 MG-UNIT tablet Take 1 tablet by mouth daily.    . diphenoxylate-atropine (LOMOTIL) 2.5-0.025 MG tablet Take 1 tablet by mouth 4 (four) times daily as needed for diarrhea or loose stools. 30 tablet 5  . EPINEPHrine 0.3 mg/0.3 mL IJ SOAJ injection Inject 0.3 mg into the muscle once as needed (ALLERGIC REACTION).     Marland Kitchen exemestane (AROMASIN) 25 MG tablet Take 1 tablet (25 mg total) by mouth daily. 30 tablet 6  . ferrous sulfate 325 (65 FE) MG tablet Take 325 mg by mouth daily with breakfast.    . lisinopril (PRINIVIL,ZESTRIL) 10 MG tablet TAKE ONE TABLET BY MOUTH EVERY DAY (Patient taking differently: Take 10 mg by mouth daily. ) 90 tablet 1  . memantine (NAMENDA TITRATION PACK) tablet pack Take by mouth See admin instructions. 5 mg/day for =1 week; 5 mg twice daily for =1 week; 15 mg/day given in 5 mg and 10 mg separated doses for =1 week; then 10 mg twice daily Call for refill after titration pack concluded 49 tablet 0  .  Methylcellulose, Laxative, (CITRUCEL) 500 MG TABS Take 1,000 mg by mouth 3 (three) times daily.     . nitrofurantoin (MACRODANTIN) 100 MG capsule Take 100 mg by mouth daily.     Marland Kitchen nystatin (MYCOSTATIN/NYSTOP) powder Apply topically 3 (three) times daily. (Patient taking differently: Apply 1 g topically 3 (three) times daily as needed (irritated skin). ) 15 g 0  . oxyCODONE (ROXICODONE) 5 MG immediate release tablet Take 1 tablet (5 mg total) by mouth every 8 (eight) hours as needed. 20 tablet 0  . Probiotic Product (PROBIOTIC PO) Take 1 capsule by mouth daily.     . sertraline (ZOLOFT) 50 MG tablet Take 50 mg by mouth daily.     . traMADol (ULTRAM) 50 MG tablet Take 1 tablet (50 mg total) by mouth every 8 (eight) hours as needed. (Patient taking differently: Take 50 mg by mouth every 8 (eight) hours as needed (pain.). ) 90 tablet 0   No current facility-administered medications for this visit.     PHYSICAL EXAMINATION: ECOG PERFORMANCE STATUS: 3 - Symptomatic, >50% confined to bed  Vitals:   01/20/19 1306  BP: (!) 152/72  Pulse: 65  Resp: 17  Temp: 98.5 F (36.9 C)  SpO2: 95%   Filed Weights    GENERAL:alert, no distress and comfortable, (+) using a wheelchai SKIN: skin color, texture, turgor are normal, no rashes or significant lesions, diffuse bruises and edema of left arm and hand, (+) brace in upper arm  EYES: normal, Conjunctiva are pink and non-injected, sclera clear OROPHARYNX:no exudate, no erythema and lips, buccal mucosa, and tongue normal  NECK: supple, thyroid normal size, non-tender, without nodularity LYMPH:  no palpable lymphadenopathy in the cervical, axillary or inguinal LUNGS: clear to auscultation and percussion with normal breathing effort HEART: regular rate & rhythm and no murmurs and no lower extremity edema ABDOMEN:abdomen soft, non-tender and normal bowel sounds Musculoskeletal:no cyanosis of digits and no clubbing, (+) right leg bruising and swelling    NEURO: alert & oriented x 3 with fluent speech, no focal motor/sensory deficits BREAST: (+) mild diffuse right breast side redness, (+) significant skin redness at the skin photo below right breast, likely yeast infection.  Palpable of the bilateral breasts and x-ray showed no mass or adenopathy  LABORATORY DATA:  I have reviewed the data as listed CBC Latest Ref Rng & Units 07/22/2018 07/02/2018 01/22/2018  WBC 3.9 -  10.3 K/uL 5.0 5.3 5.7  Hemoglobin 11.6 - 15.9 g/dL 13.8 13.3 13.2  Hematocrit 34.8 - 46.6 % 42.0 40.9 41.9  Platelets 145 - 400 K/uL 221 243 199     CMP Latest Ref Rng & Units 07/22/2018 07/02/2018 01/22/2018  Glucose 70 - 99 mg/dL 93 104(H) 121  BUN 8 - 23 mg/dL 30(H) 23 25  Creatinine 0.44 - 1.00 mg/dL 0.77 0.71 0.92  Sodium 135 - 145 mmol/L 144 141 139  Potassium 3.5 - 5.1 mmol/L 4.8 5.1 5.4(H)  Chloride 98 - 111 mmol/L 104 104 102  CO2 22 - 32 mmol/L 34(H) 29 31(H)  Calcium 8.9 - 10.3 mg/dL 10.5(H) 9.8 9.9  Total Protein 6.5 - 8.1 g/dL 7.4 6.4 7.1  Total Bilirubin 0.3 - 1.2 mg/dL 0.4 0.4 0.4  Alkaline Phos 38 - 126 U/L 95 - 136  AST 15 - 41 U/L 20 17 16   ALT 0 - 44 U/L 22 14 15       RADIOGRAPHIC STUDIES: I have personally reviewed the radiological images as listed and agreed with the findings in the report. No results found.   09/10/2019 Mammogram IMPRESSION: No mammographic evidence of malignancy.  ASSESSMENT & PLAN:   Tiffany Arnold is a 78 y.o. female with history of  1. 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 Treated with right breast lumpectomy, adjuvant chemo, radiation, and antiestrogen therapy. She was initially put on Letrozole in 01/2017, but switched to exemestane in 09/2017. Tolerating well. -I discussed and reviewed her recent mammogram results which were benign.  -She is clinically stable, no significant  pain, or concerns.  Exam showed a mild breast lymphedema and redness, likely related to previous surgery, no suspicion for cellulitis.  Due to the recent left arm injury, no lab was done today. -Continue surveillance, will see her back in 6 months.  She will continue exemestane, for total of 5 years.  2. Left Humerus fracture  -she will have surgery next week  3. Memory loss/cognitive dysfunction and intermittent confusion  -stable, continue memantine and f/u with neurology   4. Right upper extremity lymphedema  -stable, overall improved   5. HTN and arthritis  -f/u with PCP   6. Morbid obesity   7. Bone health - Currently on vitamin D. Calcium was previously high, so calcium supplements were held.  8.  Skin fungal infection below breasts -She will use nystatin powder  Plan  -F/u 6 months with lab  - I refilled exemestane   No problem-specific Assessment & Plan notes found for this encounter.   No orders of the defined types were placed in this encounter.  All questions were answered. The patient knows to call the clinic with any problems, questions or concerns. No barriers to learning was detected. I spent 15 minutes counseling the patient face to face. The total time spent in the appointment was 20 minutes and more than 50% was on counseling and review of test results  I, Manson Allan am acting as scribe for Dr. Truitt Merle.  I have reviewed the above documentation for accuracy and completeness, and I agree with the above.     Truitt Merle, MD 01/20/2019

## 2019-01-20 ENCOUNTER — Encounter: Payer: Self-pay | Admitting: Hematology

## 2019-01-20 ENCOUNTER — Inpatient Hospital Stay: Payer: Medicare PPO | Admitting: Hematology

## 2019-01-20 ENCOUNTER — Telehealth: Payer: Self-pay | Admitting: Hematology

## 2019-01-20 ENCOUNTER — Inpatient Hospital Stay: Payer: Medicare PPO | Attending: Hematology

## 2019-01-20 VITALS — BP 152/72 | HR 65 | Temp 98.5°F | Resp 17 | Ht 60.0 in

## 2019-01-20 DIAGNOSIS — C773 Secondary and unspecified malignant neoplasm of axilla and upper limb lymph nodes: Secondary | ICD-10-CM

## 2019-01-20 DIAGNOSIS — Z923 Personal history of irradiation: Secondary | ICD-10-CM

## 2019-01-20 DIAGNOSIS — R143 Flatulence: Secondary | ICD-10-CM

## 2019-01-20 DIAGNOSIS — R413 Other amnesia: Secondary | ICD-10-CM | POA: Diagnosis not present

## 2019-01-20 DIAGNOSIS — Z79811 Long term (current) use of aromatase inhibitors: Secondary | ICD-10-CM | POA: Insufficient documentation

## 2019-01-20 DIAGNOSIS — Z9221 Personal history of antineoplastic chemotherapy: Secondary | ICD-10-CM | POA: Insufficient documentation

## 2019-01-20 DIAGNOSIS — C50311 Malignant neoplasm of lower-inner quadrant of right female breast: Secondary | ICD-10-CM | POA: Insufficient documentation

## 2019-01-20 DIAGNOSIS — M1990 Unspecified osteoarthritis, unspecified site: Secondary | ICD-10-CM | POA: Insufficient documentation

## 2019-01-20 DIAGNOSIS — I10 Essential (primary) hypertension: Secondary | ICD-10-CM | POA: Diagnosis not present

## 2019-01-20 DIAGNOSIS — Z170 Estrogen receptor positive status [ER+]: Secondary | ICD-10-CM | POA: Insufficient documentation

## 2019-01-20 DIAGNOSIS — Z79899 Other long term (current) drug therapy: Secondary | ICD-10-CM | POA: Diagnosis not present

## 2019-01-20 MED ORDER — EXEMESTANE 25 MG PO TABS: 25.0000 mg | ORAL_TABLET | Freq: Every day | ORAL | 6 refills | Status: DC

## 2019-01-20 NOTE — Telephone Encounter (Signed)
Called MD she stated to schedule a lab and a f/u in 6 months.  Printed calendar and avs.

## 2019-01-24 ENCOUNTER — Other Ambulatory Visit: Payer: Self-pay | Admitting: Hematology

## 2019-01-24 MED ORDER — ALENDRONATE SODIUM 70 MG PO TABS: ORAL_TABLET | ORAL | 5 refills | Status: DC

## 2019-01-25 ENCOUNTER — Encounter (HOSPITAL_COMMUNITY): Payer: Self-pay

## 2019-01-25 NOTE — Patient Instructions (Addendum)
Tiffany Arnold  07-07-1941     Your procedure is scheduled on:  01-27-2019   Report to [facility] Main  Entrance,  Report to admitting at  12:45 AM    Call this number if you have problems the morning of surgery [phone]       Remember: Do not eat food After Midnight.   Clear liquid diet from midnight until 8:30 AM day of surgery.   Nothing by mouth after 8:30 AM including water, candy, gum, mints.   BRUSH YOUR TEETH MORNING OF SURGERY AND RINSE YOUR MOUTH OUT       Take these medicines the morning of surgery with A SIP OF WATER:    Exemestane (aromasin),  Sertraline (zoloft),  Tylenol if needed                                   You may not have any metal on your body including hair pins and               piercings  Do not wear jewelry, make-up, lotions, powders or perfumes, deodorant              Do not wear nail polish.  Do not shave  48 hours prior to surgery.                Do not bring valuables to the hospital. [place].  Contacts, dentures or bridgework may not be worn into surgery.  Leave suitcase in the car. After surgery it may be brought to your room.                  SPECIAL INSTRUCTIONS:   BRING YOUR CPAP MASK AND TUBING WITH YOU DAY OF SURGERY _____________________________________________________________________     CLEAR LIQUID DIET   Foods Allowed                                                                     Foods Excluded  Coffee and tea, regular and decaf                             liquids that you cannot  Plain Jell-O in any flavor                                             see through such as: Fruit ices (not with fruit pulp)                                     milk, soups, orange juice  Iced Popsicles  All solid food Carbonated beverages, regular and diet                                    Cranberry, grape and  apple juices Sports drinks like Gatorade Lightly seasoned clear broth or consume(fat free) Sugar, honey syrup  Sample Menu Breakfast                                Lunch                                     Supper Cranberry juice                    Beef broth                            Chicken broth Jell-O                                     Grape juice                           Apple juice Coffee or tea                        Jell-O                                      Popsicle                                                Coffee or tea                        Coffee or tea  _____________________________________________________________________             [facility] - Preparing for Surgery Before surgery, you can play an important role.  Because skin is not sterile, your skin needs to be as free of germs as possible.  You can reduce the number of germs on your skin by washing with CHG (chlorahexidine gluconate) soap before surgery.  CHG is an antiseptic cleaner which kills germs and bonds with the skin to continue killing germs even after washing. Please DO NOT use if you have an allergy to CHG or antibacterial soaps.  If your skin becomes reddened/irritated stop using the CHG and inform your nurse when you arrive at Short Stay. Do not shave (including legs and underarms) for at least 48 hours prior to the first CHG shower.  You may shave your face/neck. Please follow these instructions carefully:  1.  Shower with CHG Soap the night before surgery and the  morning of Surgery.  2.  If you choose to wash your hair, wash your hair first as usual with your  normal  shampoo.  3.  After you shampoo, rinse your hair and body thoroughly to remove the  shampoo.                            4.  Use CHG as you would any other liquid soap.  You can apply chg directly  to the skin and wash                       Gently with a scrungie or clean washcloth.  5.  Apply the CHG Soap to your body ONLY FROM THE  NECK DOWN.   Do not use on face/ open                           Wound or open sores. Avoid contact with eyes, ears mouth and genitals (private parts).                       Wash face,  Genitals (private parts) with your normal soap.             6.  Wash thoroughly, paying special attention to the area where your surgery  will be performed.  7.  Thoroughly rinse your body with warm water from the neck down.  8.  DO NOT shower/wash with your normal soap after using and rinsing off  the CHG Soap.             9.  Pat yourself dry with a clean towel.            10.  Wear clean pajamas.            11.  Place clean sheets on your bed the night of your first shower and do not  sleep with pets. Day of Surgery : Do not apply any lotions/deodorants the morning of surgery.  Please wear clean clothes to the hospital/surgery center.  FAILURE TO FOLLOW THESE INSTRUCTIONS MAY RESULT IN THE CANCELLATION OF YOUR SURGERY PATIENT SIGNATURE_________________________________  NURSE SIGNATURE__________________________________  ________________________________________________________________________   [name]  An incentive spirometer is a tool that can help keep your lungs clear and active. This tool measures how well you are filling your lungs with each breath. Taking long deep breaths may help reverse or decrease the chance of developing breathing (pulmonary) problems (especially infection) following:  A long period of time when you are unable to move or be active. BEFORE THE PROCEDURE   If the spirometer includes an indicator to show your best effort, your nurse or respiratory therapist will set it to a desired goal.  If possible, sit up straight or lean slightly forward. Try not to slouch.  Hold the incentive spirometer in an upright position. INSTRUCTIONS FOR USE  1. Sit on the edge of your bed if possible, or sit up as far as you can in bed or on a chair. 2. Hold the incentive spirometer  in an upright position. 3. Breathe out normally. 4. Place the mouthpiece in your mouth and seal your lips tightly around it. 5. Breathe in slowly and as deeply as possible, raising the piston or the ball toward the top of the column. 6. Hold your breath for 3-5 seconds or for as long as possible. Allow the piston or ball to fall to the bottom of the column. 7. Remove the mouthpiece from your mouth and breathe out normally. 8. Rest for a few seconds and repeat Steps 1 through 7 at least 10 times every 1-2  hours when you are awake. Take your time and take a few normal breaths between deep breaths. 9. The spirometer may include an indicator to show your best effort. Use the indicator as a goal to work toward during each repetition. 10. After each set of 10 deep breaths, practice coughing to be sure your lungs are clear. If you have an incision (the cut made at the time of surgery), support your incision when coughing by placing a pillow or rolled up towels firmly against it. Once you are able to get out of bed, walk around indoors and cough well. You may stop using the incentive spirometer when instructed by your caregiver.  RISKS AND COMPLICATIONS  Take your time so you do not get dizzy or light-headed.  If you are in pain, you may need to take or ask for pain medication before doing incentive spirometry. It is harder to take a deep breath if you are having pain. AFTER USE  Rest and breathe slowly and easily.  It can be helpful to keep track of a log of your progress. Your caregiver can provide you with a simple table to help with this. If you are using the spirometer at home, follow these instructions: [place] IF:   You are having difficultly using the spirometer.  You have trouble using the spirometer as often as instructed.  Your pain medication is not giving enough relief while using the spirometer.  You develop fever of 100.5 F (38.1 C) or higher. SEEK IMMEDIATE MEDICAL  CARE IF:   You cough up bloody sputum that had not been present before.  You develop fever of 102 F (38.9 C) or greater.  You develop worsening pain at or near the incision site. MAKE SURE YOU:   Understand these instructions.  Will watch your condition.  Will get help right away if you are not doing well or get worse. Document Released: 04/20/2007 Document Revised: 03/01/2012 Document Reviewed: 06/21/2007 [facility] Patient Information 2014 [place], [place].   ________________________________________________________________________

## 2019-01-26 ENCOUNTER — Encounter (HOSPITAL_COMMUNITY): Payer: Self-pay

## 2019-01-26 ENCOUNTER — Other Ambulatory Visit: Payer: Self-pay

## 2019-01-26 ENCOUNTER — Encounter (HOSPITAL_COMMUNITY)
Admission: RE | Admit: 2019-01-26 | Discharge: 2019-01-26 | Disposition: A | Discharge status: Home / Self Care | Payer: Medicare PPO | Source: Ambulatory Visit | Attending: Orthopedic Surgery | Admitting: Orthopedic Surgery

## 2019-01-26 DIAGNOSIS — M9732XA Periprosthetic fracture around internal prosthetic left shoulder joint, initial encounter: Secondary | ICD-10-CM

## 2019-01-26 DIAGNOSIS — Z01818 Encounter for other preprocedural examination: Secondary | ICD-10-CM

## 2019-01-26 HISTORY — DX: Estrogen receptor positive status (ER+): Z17.0

## 2019-01-26 HISTORY — DX: Cyst of kidney, acquired: N28.1

## 2019-01-26 HISTORY — DX: Obstructive sleep apnea (adult) (pediatric): G47.33

## 2019-01-26 HISTORY — DX: Lymphedema, not elsewhere classified: I89.0

## 2019-01-26 HISTORY — DX: Mild persistent asthma, uncomplicated: J45.30

## 2019-01-26 HISTORY — DX: Contusion of unspecified upper arm, initial encounter: S40.029A

## 2019-01-26 HISTORY — DX: Presence of spectacles and contact lenses: Z97.3

## 2019-01-26 HISTORY — DX: Dependence on other enabling machines and devices: Z99.89

## 2019-01-26 HISTORY — DX: Unspecified osteoarthritis, unspecified site: M19.90

## 2019-01-26 HISTORY — DX: Personal history of irradiation: Z92.3

## 2019-01-26 HISTORY — DX: Urge incontinence: N39.41

## 2019-01-26 HISTORY — DX: Generalized anxiety disorder: F41.1

## 2019-01-26 HISTORY — DX: Other chronic cystitis without hematuria: N30.20

## 2019-01-26 HISTORY — DX: Type 2 diabetes mellitus without complications: E11.9

## 2019-01-26 HISTORY — DX: Other seasonal allergic rhinitis: J30.2

## 2019-01-26 HISTORY — DX: Personal history of urinary (tract) infections: Z87.440

## 2019-01-26 HISTORY — DX: Personal history of antineoplastic chemotherapy: Z92.21

## 2019-01-26 HISTORY — DX: Unspecified fracture of shaft of humerus, unspecified arm, initial encounter for closed fracture: S42.309A

## 2019-01-26 HISTORY — DX: Malignant neoplasm of lower-inner quadrant of right female breast: C50.311

## 2019-01-26 LAB — CBC
HCT: 46.1 % — ABNORMAL HIGH (ref 36.0–46.0)
Hemoglobin: 13.8 g/dL (ref 12.0–15.0)
MCH: 29.9 pg (ref 26.0–34.0)
MCHC: 29.9 g/dL — ABNORMAL LOW (ref 30.0–36.0)
MCV: 100 fL (ref 80.0–100.0)
Platelets: 287 10*3/uL (ref 150–400)
RBC: 4.61 MIL/uL (ref 3.87–5.11)
RDW: 13.2 % (ref 11.5–15.5)
WBC: 7.1 10*3/uL (ref 4.0–10.5)
nRBC: 0 % (ref 0.0–0.2)

## 2019-01-26 LAB — HEMOGLOBIN A1C
Hgb A1c MFr Bld: 6.2 % — ABNORMAL HIGH (ref 4.8–5.6)
Mean Plasma Glucose: 131.24 mg/dL

## 2019-01-26 LAB — BASIC METABOLIC PANEL
Anion gap: 8 (ref 5–15)
BUN: 24 mg/dL — ABNORMAL HIGH (ref 8–23)
CO2: 30 mmol/L (ref 22–32)
Calcium: 10.3 mg/dL (ref 8.9–10.3)
Chloride: 104 mmol/L (ref 98–111)
Creatinine, Ser: 0.64 mg/dL (ref 0.44–1.00)
GFR calc Af Amer: >60 mL/min (ref >60)
Glucose, Bld: 161 mg/dL — ABNORMAL HIGH (ref 70–99)
Potassium: 4.9 mmol/L (ref 3.5–5.1)
Sodium: 142 mmol/L (ref 135–145)

## 2019-01-26 NOTE — Anesthesia Preprocedure Evaluation (Addendum)
Anesthesia Evaluation  Patient identified by MRN, date of birth, ID band Patient awake    Reviewed: Allergy & Precautions, H&P , NPO status , Patient's Chart, lab work & pertinent test results  Airway Mallampati: III  TM Distance: >3 FB Neck ROM: Full    Dental no notable dental hx. (+) Teeth Intact, Dental Advisory Given   Pulmonary asthma , sleep apnea and Continuous Positive Airway Pressure Ventilation , former smoker,    Pulmonary exam normal breath sounds clear to auscultation       Cardiovascular hypertension, Pt. on medications  Rhythm:Regular Rate:Normal     Neuro/Psych PSYCHIATRIC DISORDERS Anxiety Intermittent confusion  negative neurological ROS     GI/Hepatic Neg liver ROS, GERD  ,  Endo/Other  diabetesMorbid obesity  Renal/GU negative Renal ROS  negative genitourinary   Musculoskeletal   Abdominal   Peds  Hematology  (+) Blood dyscrasia, anemia ,   Anesthesia Other Findings   Reproductive/Obstetrics negative OB ROS                           Anesthesia Physical Anesthesia Plan  ASA: III  Anesthesia Plan: General   Post-op Pain Management:  Regional for Post-op pain   Induction: Intravenous  PONV Risk Score and Plan: 3 and Ondansetron, Dexamethasone and Diphenhydramine  Airway Management Planned: Oral ETT  Additional Equipment:   Intra-op Plan:   Post-operative Plan: Extubation in OR  Informed Consent: I have reviewed the patients History and Physical, chart, labs and discussed the procedure including the risks, benefits and alternatives for the proposed anesthesia with the patient or authorized representative who has indicated his/her understanding and acceptance.     Dental advisory given  Plan Discussed with: CRNA  Anesthesia Plan Comments: (See PST note 01/26/2019, Konrad Felix, PA-C)      Anesthesia Quick Evaluation

## 2019-01-26 NOTE — Progress Notes (Signed)
Anesthesia Chart Review   Case:  [id number] Date/Time:  01/27/19 1427   Procedure:  OPEN REDUCTION INTERNAL FIXATION (ORIF) LEFT HUMERUS (Left )   Anesthesia type:  General   Pre-op diagnosis:  left periprosthetic humerus fracture   Location:  [facility] ROOM 07 / [facility]   Surgeon:  Justice Britain, MD      DISCUSSION: 78 yo former smoker (50 pack years, quit 12/23/91) with h/o HTN, OSA on CPAP, asthma, GAD, DM II, breast cancer (s/p chemo/radiation 2018, right side with residual lyphadema), memory disorder, recurrent UTIs, left periprosthetic humerus fracture scheduled for above surgery on 01/27/19 with Dr. Justice Britain.   Last seen by oncologist, Dr. Truitt Merle, on 01/20/2019.  Stable at this visit.  She is currently on Exemestane and will continue for total of 5 years.  Pt can proceed with planned procedure barring acute status change.  VS: BP (!) 166/88 Comment: checked bp again after pre-op interview in right arm  Pulse 65   Temp 36.7 C (Oral)   Resp 18   Ht 5' (1.524 m)   SpO2 98%   BMI 39.06 kg/m   PROVIDERS: [name], NP is PCP   Truitt Merle, MD with Medical Oncology LABS: Labs reviewed: Acceptable for surgery. (all labs ordered are listed, but only abnormal results are displayed)  Labs Reviewed  BASIC METABOLIC PANEL - Abnormal; Notable for the following components:      Result Value   Glucose, Bld 161 (*)    BUN 24 (*)    All other components within normal limits  CBC - Abnormal; Notable for the following components:   HCT 46.1 (*)    MCHC 29.9 (*)    All other components within normal limits  HEMOGLOBIN A1C - Abnormal; Notable for the following components:   Hgb A1c MFr Bld 6.2 (*)    All other components within normal limits     IMAGES:   EKG: 01/26/2019 Rate 63 bpm Normal sinus rhythm Normal ECG No significant change since last tracing   CV:  Past Medical History:  Diagnosis Date  . Allergic rhinitis, seasonal   . Bilateral renal cysts    simple  (monitored by dr Karsten Ro)  . Chronic cystitis    urologist-- dr Karsten Ro  . GAD (generalized anxiety disorder)   . History of cancer chemotherapy 09-09-2016  to 11-12-2016   breast cancer  . History of external beam radiation therapy 12-24-2016  to 02-03-2017   right breast  . History of recurrent UTIs   . Humerus fracture 01/05/2019   left ,  hx total shoulder arthroplasty   . Hyperlipidemia   . Hypertension   . IBS (irritable bowel syndrome)    mixed  . Lymphedema of right upper extremity   . 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 Memory disorder 06/22/2017   with intermittant confusion;  neurologist-- dr Krista Blue (note in epic)  . Mild persistent asthma    no inhaler  . OA (osteoarthritis)   . OSA on CPAP   . Type 2 diabetes, diet controlled ([place])   . Urge incontinence of urine   . Wears  glasses     Past Surgical History:  Procedure Laterality Date  . BREAST CYST EXCISION Right 2001   negative  . BREAST LUMPECTOMY WITH NEEDLE LOCALIZATION AND AXILLARY LYMPH NODE DISSECTION Right 07/29/2016   Procedure: RIGHT BREAST LUMPECTOMY WITH DOUBLE NEEDLE LOCALIZATION AND COMPLETE RIGHT AXILLARY LYMPH NODE DISSECTION;  Surgeon: Fanny Skates, MD;  Location: [place];  Service: General;  Laterality: Right;  . BREAST SURGERY Left 2000   Biopsy  . BUNIONECTOMY Bilateral 1998   great toe fusion on right foot  . CATARACT EXTRACTION W/ INTRAOCULAR LENS  IMPLANT, BILATERAL  right,  fall 2019;  left 2017  . COLONOSCOPY W/ POLYPECTOMY    . LUMBAR SPINE SURGERY  1970s  . NASAL SINUS SURGERY  2015  . PORTACATH PLACEMENT N/A 09/05/2016   Procedure:  INSERTION PORT-A-CATH;  Surgeon: Fanny Skates, MD;  [facility];  Service: General;  Laterality: N/A;  . TOTAL KNEE ARTHROPLASTY Bilateral 2007  . TOTAL SHOULDER REPLACEMENT Left 2011  . UMBILICAL HERNIA REPAIR  04-06-2014   _0     MEDICATIONS: . acetaminophen (TYLENOL) 500 MG tablet  . alendronate (FOSAMAX) 70 MG tablet  . ALPRAZolam (XANAX) 0.25 MG tablet  . Calcium Carbonate-Vitamin D (CALCIUM-VITAMIN D) 500-200 MG-UNIT tablet  . cetirizine (ZYRTEC) 10 MG tablet  . Cholecalciferol (VITAMIN D-3) 125 MCG (5000 UT) TABS  . diphenoxylate-atropine (LOMOTIL) 2.5-0.025 MG tablet  . EPINEPHrine 0.3 mg/0.3 mL IJ SOAJ injection  . exemestane (AROMASIN) 25 MG tablet  . ferrous sulfate 325 (65 FE) MG tablet  . lisinopril (PRINIVIL,ZESTRIL) 10 MG tablet  . memantine (NAMENDA TITRATION PACK) tablet pack  . Methylcellulose, Laxative, (CITRUCEL) 500 MG TABS  . nitrofurantoin (MACRODANTIN) 100 MG capsule  . nystatin (MYCOSTATIN/NYSTOP) powder  . oxyCODONE (ROXICODONE) 5 MG immediate release tablet  . Probiotic Product (PROBIOTIC PO)  . sertraline (ZOLOFT) 50 MG tablet  . traMADol (ULTRAM) 50 MG tablet   No current facility-administered medications for this encounter.     Maia Plan [facility] Pre-Surgical Testing [phone] 01/26/19 12:31 PM

## 2019-01-26 NOTE — Progress Notes (Signed)
Final EKG dated 01-26-2019 in epic.  Due to pt medical history , chart given to anesthesia for review, Konrad Felix PA.

## 2019-01-27 ENCOUNTER — Other Ambulatory Visit: Payer: Self-pay

## 2019-01-27 ENCOUNTER — Ambulatory Visit (HOSPITAL_COMMUNITY): Payer: Medicare PPO | Admitting: Anesthesiology

## 2019-01-27 ENCOUNTER — Encounter (HOSPITAL_COMMUNITY)
Admission: RE | Disposition: A | Discharge status: Skilled Nursing Facility | Payer: Self-pay | Source: Ambulatory Visit | Attending: Orthopedic Surgery

## 2019-01-27 ENCOUNTER — Ambulatory Visit (HOSPITAL_COMMUNITY): Payer: Medicare PPO | Admitting: Physician Assistant

## 2019-01-27 ENCOUNTER — Encounter (HOSPITAL_COMMUNITY): Payer: Self-pay | Admitting: Emergency Medicine

## 2019-01-27 ENCOUNTER — Inpatient Hospital Stay (HOSPITAL_COMMUNITY)
Admission: RE | Admit: 2019-01-27 | Discharge: 2019-01-29 | DRG: 493 | Disposition: A | Discharge status: Skilled Nursing Facility | Payer: Medicare PPO | Source: Ambulatory Visit | Attending: Orthopedic Surgery | Admitting: Orthopedic Surgery

## 2019-01-27 ENCOUNTER — Ambulatory Visit (HOSPITAL_COMMUNITY): Payer: Medicare PPO

## 2019-01-27 DIAGNOSIS — N281 Cyst of kidney, acquired: Secondary | ICD-10-CM | POA: Diagnosis present

## 2019-01-27 DIAGNOSIS — W1830XA Fall on same level, unspecified, initial encounter: Secondary | ICD-10-CM | POA: Diagnosis present

## 2019-01-27 DIAGNOSIS — Z87440 Personal history of urinary (tract) infections: Secondary | ICD-10-CM

## 2019-01-27 DIAGNOSIS — G4733 Obstructive sleep apnea (adult) (pediatric): Secondary | ICD-10-CM | POA: Diagnosis present

## 2019-01-27 DIAGNOSIS — M9732XA Periprosthetic fracture around internal prosthetic left shoulder joint, initial encounter: Principal | ICD-10-CM | POA: Diagnosis present

## 2019-01-27 DIAGNOSIS — Z800 Family history of malignant neoplasm of digestive organs: Secondary | ICD-10-CM

## 2019-01-27 DIAGNOSIS — Z8249 Family history of ischemic heart disease and other diseases of the circulatory system: Secondary | ICD-10-CM | POA: Diagnosis not present

## 2019-01-27 DIAGNOSIS — J4530 Mild persistent asthma, uncomplicated: Secondary | ICD-10-CM | POA: Diagnosis present

## 2019-01-27 DIAGNOSIS — Z87891 Personal history of nicotine dependence: Secondary | ICD-10-CM | POA: Diagnosis not present

## 2019-01-27 DIAGNOSIS — S42202A Unspecified fracture of upper end of left humerus, initial encounter for closed fracture: Secondary | ICD-10-CM | POA: Diagnosis present

## 2019-01-27 DIAGNOSIS — S42309A Unspecified fracture of shaft of humerus, unspecified arm, initial encounter for closed fracture: Secondary | ICD-10-CM

## 2019-01-27 DIAGNOSIS — Z79899 Other long term (current) drug therapy: Secondary | ICD-10-CM

## 2019-01-27 DIAGNOSIS — E119 Type 2 diabetes mellitus without complications: Secondary | ICD-10-CM | POA: Diagnosis present

## 2019-01-27 DIAGNOSIS — Z170 Estrogen receptor positive status [ER+]: Secondary | ICD-10-CM

## 2019-01-27 DIAGNOSIS — Z8042 Family history of malignant neoplasm of prostate: Secondary | ICD-10-CM | POA: Diagnosis not present

## 2019-01-27 DIAGNOSIS — Z6841 Body Mass Index (BMI) 40.0 and over, adult: Secondary | ICD-10-CM

## 2019-01-27 DIAGNOSIS — F411 Generalized anxiety disorder: Secondary | ICD-10-CM | POA: Diagnosis present

## 2019-01-27 DIAGNOSIS — N3020 Other chronic cystitis without hematuria: Secondary | ICD-10-CM | POA: Diagnosis present

## 2019-01-27 DIAGNOSIS — Z96612 Presence of left artificial shoulder joint: Secondary | ICD-10-CM | POA: Diagnosis present

## 2019-01-27 DIAGNOSIS — Z9109 Other allergy status, other than to drugs and biological substances: Secondary | ICD-10-CM

## 2019-01-27 DIAGNOSIS — F0390 Unspecified dementia without behavioral disturbance: Secondary | ICD-10-CM | POA: Diagnosis present

## 2019-01-27 DIAGNOSIS — I10 Essential (primary) hypertension: Secondary | ICD-10-CM | POA: Diagnosis present

## 2019-01-27 DIAGNOSIS — Z9221 Personal history of antineoplastic chemotherapy: Secondary | ICD-10-CM | POA: Diagnosis not present

## 2019-01-27 DIAGNOSIS — Z79811 Long term (current) use of aromatase inhibitors: Secondary | ICD-10-CM

## 2019-01-27 DIAGNOSIS — E785 Hyperlipidemia, unspecified: Secondary | ICD-10-CM | POA: Diagnosis present

## 2019-01-27 DIAGNOSIS — K582 Mixed irritable bowel syndrome: Secondary | ICD-10-CM | POA: Diagnosis present

## 2019-01-27 DIAGNOSIS — Z853 Personal history of malignant neoplasm of breast: Secondary | ICD-10-CM | POA: Diagnosis not present

## 2019-01-27 DIAGNOSIS — Z96653 Presence of artificial knee joint, bilateral: Secondary | ICD-10-CM | POA: Diagnosis present

## 2019-01-27 DIAGNOSIS — Z823 Family history of stroke: Secondary | ICD-10-CM

## 2019-01-27 HISTORY — PX: ORIF HUMERUS FRACTURE: SHX2126

## 2019-01-27 LAB — GLUCOSE, CAPILLARY
Glucose-Capillary: 80 mg/dL (ref 70–99)
Glucose-Capillary: 159 mg/dL — ABNORMAL HIGH (ref 70–99)

## 2019-01-27 SURGERY — OPEN REDUCTION INTERNAL FIXATION (ORIF) HUMERAL SHAFT FRACTURE
Anesthesia: General | Site: Arm Upper | Laterality: Left

## 2019-01-27 MED ORDER — DOCUSATE SODIUM 100 MG PO CAPS
100.0000 mg | ORAL_CAPSULE | Freq: Two times a day (BID) | ORAL | Status: DC
  Administered 2019-01-27 – 2019-01-29 (×4): 100 mg via ORAL
  Filled 2019-01-27 (×4): qty 1

## 2019-01-27 MED ORDER — LIDOCAINE HCL (CARDIAC) PF 100 MG/5ML IV SOSY
PREFILLED_SYRINGE | INTRAVENOUS | Status: DC | PRN
  Administered 2019-01-27: 40 mg via INTRAVENOUS

## 2019-01-27 MED ORDER — PHENOL 1.4 % MT LIQD: 1.0000 | OROMUCOSAL | Status: DC | PRN

## 2019-01-27 MED ORDER — CEFAZOLIN SODIUM-DEXTROSE 2-4 GM/100ML-% IV SOLN
2.0000 g | INTRAVENOUS | Status: AC
  Administered 2019-01-27: 2 g via INTRAVENOUS
  Filled 2019-01-27: qty 100

## 2019-01-27 MED ORDER — ALUM & MAG HYDROXIDE-SIMETH 200-200-20 MG/5ML PO SUSP: 30.0000 mL | ORAL | Status: DC | PRN

## 2019-01-27 MED ORDER — MAGNESIUM CITRATE PO SOLN: 1.0000 | Freq: Once | ORAL | Status: DC | PRN

## 2019-01-27 MED ORDER — POLYETHYLENE GLYCOL 3350 17 G PO PACK: 17.0000 g | PACK | Freq: Every day | ORAL | Status: DC | PRN

## 2019-01-27 MED ORDER — 0.9 % SODIUM CHLORIDE (POUR BTL) OPTIME
TOPICAL | Status: DC | PRN
  Administered 2019-01-27: 1000 mL

## 2019-01-27 MED ORDER — TRANEXAMIC ACID-NACL 1000-0.7 MG/100ML-% IV SOLN
1000.0000 mg | INTRAVENOUS | Status: AC
  Administered 2019-01-27: 1000 mg via INTRAVENOUS
  Filled 2019-01-27: qty 100

## 2019-01-27 MED ORDER — EPHEDRINE SULFATE 50 MG/ML IJ SOLN
INTRAMUSCULAR | Status: DC | PRN
  Administered 2019-01-27 (×7): 5 mg via INTRAVENOUS

## 2019-01-27 MED ORDER — SUCCINYLCHOLINE CHLORIDE 200 MG/10ML IV SOSY
PREFILLED_SYRINGE | INTRAVENOUS | Status: AC
  Filled 2019-01-27: qty 30

## 2019-01-27 MED ORDER — ONDANSETRON HCL 4 MG/2ML IJ SOLN
INTRAMUSCULAR | Status: DC | PRN
  Administered 2019-01-27: 4 mg via INTRAVENOUS

## 2019-01-27 MED ORDER — EPHEDRINE 5 MG/ML INJ
INTRAVENOUS | Status: AC
  Filled 2019-01-27: qty 20

## 2019-01-27 MED ORDER — BISACODYL 5 MG PO TBEC: 5.0000 mg | DELAYED_RELEASE_TABLET | Freq: Every day | ORAL | Status: DC | PRN

## 2019-01-27 MED ORDER — PHENYLEPHRINE 40 MCG/ML (10ML) SYRINGE FOR IV PUSH (FOR BLOOD PRESSURE SUPPORT)
PREFILLED_SYRINGE | INTRAVENOUS | Status: AC
  Filled 2019-01-27: qty 20

## 2019-01-27 MED ORDER — PANTOPRAZOLE SODIUM 40 MG PO TBEC
40.0000 mg | DELAYED_RELEASE_TABLET | Freq: Every day | ORAL | Status: DC
  Administered 2019-01-27 – 2019-01-29 (×3): 40 mg via ORAL
  Filled 2019-01-27 (×3): qty 1

## 2019-01-27 MED ORDER — FENTANYL CITRATE (PF) 100 MCG/2ML IJ SOLN
INTRAMUSCULAR | Status: AC
  Filled 2019-01-27: qty 2

## 2019-01-27 MED ORDER — FENTANYL CITRATE (PF) 100 MCG/2ML IJ SOLN
50.0000 ug | INTRAMUSCULAR | Status: DC
  Administered 2019-01-27: 50 ug via INTRAVENOUS
  Filled 2019-01-27: qty 2

## 2019-01-27 MED ORDER — PROPOFOL 10 MG/ML IV BOLUS
INTRAVENOUS | Status: DC | PRN
  Administered 2019-01-27: 30 mg via INTRAVENOUS
  Administered 2019-01-27: 150 mg via INTRAVENOUS

## 2019-01-27 MED ORDER — EXEMESTANE 25 MG PO TABS
25.0000 mg | ORAL_TABLET | Freq: Every day | ORAL | Status: DC
  Administered 2019-01-28 – 2019-01-29 (×2): 25 mg via ORAL
  Filled 2019-01-27 (×3): qty 1

## 2019-01-27 MED ORDER — MIDAZOLAM HCL 2 MG/2ML IJ SOLN
1.0000 mg | INTRAMUSCULAR | Status: DC
  Filled 2019-01-27: qty 2

## 2019-01-27 MED ORDER — PROPOFOL 10 MG/ML IV BOLUS
INTRAVENOUS | Status: AC
  Filled 2019-01-27: qty 20

## 2019-01-27 MED ORDER — METOCLOPRAMIDE HCL 5 MG/ML IJ SOLN: 5.0000 mg | Freq: Three times a day (TID) | INTRAMUSCULAR | Status: DC | PRN

## 2019-01-27 MED ORDER — FENTANYL CITRATE (PF) 100 MCG/2ML IJ SOLN
INTRAMUSCULAR | Status: DC | PRN
  Administered 2019-01-27: 50 ug via INTRAVENOUS
  Administered 2019-01-27: 100 ug via INTRAVENOUS
  Administered 2019-01-27 (×3): 50 ug via INTRAVENOUS

## 2019-01-27 MED ORDER — ACETAMINOPHEN 325 MG PO TABS
325.0000 mg | ORAL_TABLET | Freq: Four times a day (QID) | ORAL | Status: DC | PRN
  Administered 2019-01-28 – 2019-01-29 (×2): 650 mg via ORAL
  Filled 2019-01-27 (×2): qty 2

## 2019-01-27 MED ORDER — SUCCINYLCHOLINE CHLORIDE 20 MG/ML IJ SOLN
INTRAMUSCULAR | Status: DC | PRN
  Administered 2019-01-27: 120 mg via INTRAVENOUS

## 2019-01-27 MED ORDER — CHLORHEXIDINE GLUCONATE 4 % EX LIQD: 60.0000 mL | Freq: Once | CUTANEOUS | Status: DC

## 2019-01-27 MED ORDER — LISINOPRIL 10 MG PO TABS
10.0000 mg | ORAL_TABLET | Freq: Every day | ORAL | Status: DC
  Administered 2019-01-27 – 2019-01-29 (×3): 10 mg via ORAL
  Filled 2019-01-27 (×3): qty 1

## 2019-01-27 MED ORDER — METOCLOPRAMIDE HCL 5 MG PO TABS: 5.0000 mg | ORAL_TABLET | Freq: Three times a day (TID) | ORAL | Status: DC | PRN

## 2019-01-27 MED ORDER — ONDANSETRON HCL 4 MG/2ML IJ SOLN
INTRAMUSCULAR | Status: AC
  Filled 2019-01-27: qty 6

## 2019-01-27 MED ORDER — ROCURONIUM BROMIDE 100 MG/10ML IV SOLN
INTRAVENOUS | Status: AC
  Filled 2019-01-27: qty 1

## 2019-01-27 MED ORDER — BUPIVACAINE-EPINEPHRINE (PF) 0.5% -1:200000 IJ SOLN
INTRAMUSCULAR | Status: DC | PRN
  Administered 2019-01-27: 15 mL via PERINEURAL

## 2019-01-27 MED ORDER — OXYCODONE HCL 5 MG PO TABS: 10.0000 mg | ORAL_TABLET | ORAL | Status: DC | PRN

## 2019-01-27 MED ORDER — HYDROMORPHONE HCL 1 MG/ML IJ SOLN: 0.5000 mg | INTRAMUSCULAR | Status: DC | PRN

## 2019-01-27 MED ORDER — LACTATED RINGERS IV SOLN: INTRAVENOUS | Status: DC

## 2019-01-27 MED ORDER — DEXAMETHASONE SODIUM PHOSPHATE 10 MG/ML IJ SOLN
INTRAMUSCULAR | Status: DC | PRN
  Administered 2019-01-27: 8 mg via INTRAVENOUS

## 2019-01-27 MED ORDER — HYDROMORPHONE HCL 1 MG/ML IJ SOLN: 0.2500 mg | INTRAMUSCULAR | Status: DC | PRN

## 2019-01-27 MED ORDER — DIPHENHYDRAMINE HCL 50 MG/ML IJ SOLN
INTRAMUSCULAR | Status: DC | PRN
  Administered 2019-01-27: 12.5 mg via INTRAVENOUS

## 2019-01-27 MED ORDER — DEXAMETHASONE SODIUM PHOSPHATE 10 MG/ML IJ SOLN
INTRAMUSCULAR | Status: AC
  Filled 2019-01-27: qty 3

## 2019-01-27 MED ORDER — MENTHOL 3 MG MT LOZG: 1.0000 | LOZENGE | OROMUCOSAL | Status: DC | PRN

## 2019-01-27 MED ORDER — NITROFURANTOIN MACROCRYSTAL 100 MG PO CAPS
100.0000 mg | ORAL_CAPSULE | Freq: Every day | ORAL | Status: DC
  Administered 2019-01-27 – 2019-01-28 (×2): 100 mg via ORAL
  Filled 2019-01-27 (×2): qty 1

## 2019-01-27 MED ORDER — TRAMADOL HCL 50 MG PO TABS
50.0000 mg | ORAL_TABLET | Freq: Four times a day (QID) | ORAL | Status: DC | PRN
  Administered 2019-01-29: 50 mg via ORAL
  Filled 2019-01-27: qty 1

## 2019-01-27 MED ORDER — DIPHENHYDRAMINE HCL 12.5 MG/5ML PO ELIX: 12.5000 mg | ORAL_SOLUTION | ORAL | Status: DC | PRN

## 2019-01-27 MED ORDER — ONDANSETRON HCL 4 MG/2ML IJ SOLN: 4.0000 mg | Freq: Four times a day (QID) | INTRAMUSCULAR | Status: DC | PRN

## 2019-01-27 MED ORDER — SERTRALINE HCL 50 MG PO TABS
50.0000 mg | ORAL_TABLET | Freq: Every day | ORAL | Status: DC
  Administered 2019-01-28 – 2019-01-29 (×2): 50 mg via ORAL
  Filled 2019-01-27 (×2): qty 1

## 2019-01-27 MED ORDER — OXYCODONE HCL 5 MG PO TABS: 5.0000 mg | ORAL_TABLET | ORAL | Status: DC | PRN

## 2019-01-27 MED ORDER — LACTATED RINGERS IV SOLN
INTRAVENOUS | Status: DC
  Administered 2019-01-27 (×2): via INTRAVENOUS

## 2019-01-27 MED ORDER — BUPIVACAINE LIPOSOME 1.3 % IJ SUSP
INTRAMUSCULAR | Status: DC | PRN
  Administered 2019-01-27: 10 mL via PERINEURAL

## 2019-01-27 MED ORDER — LIDOCAINE 2% (20 MG/ML) 5 ML SYRINGE
INTRAMUSCULAR | Status: AC
  Filled 2019-01-27: qty 20

## 2019-01-27 MED ORDER — ONDANSETRON HCL 4 MG PO TABS: 4.0000 mg | ORAL_TABLET | Freq: Four times a day (QID) | ORAL | Status: DC | PRN

## 2019-01-27 SURGICAL SUPPLY — 55 items
BAG ZIPLOCK 12X15 (MISCELLANEOUS) ×2 IMPLANT
BIT DRILL 2.5X110 QC LCP DISP (BIT) ×2 IMPLANT
BIT DRILL LONG 2.7 (BIT) ×1 IMPLANT
COVER SURGICAL LIGHT HANDLE (MISCELLANEOUS) ×2 IMPLANT
COVER WAND RF STERILE (DRAPES) IMPLANT
DERMABOND ADVANCED (GAUZE/BANDAGES/DRESSINGS) ×1
DERMABOND ADVANCED .7 DNX12 (GAUZE/BANDAGES/DRESSINGS) ×1 IMPLANT
DRAPE C-ARM 42X120 X-RAY (DRAPES) ×2 IMPLANT
DRAPE INCISE IOBAN 66X45 STRL (DRAPES) IMPLANT
DRAPE SURG 17X11 SM STRL (DRAPES) ×2 IMPLANT
DRAPE U-SHAPE 47X51 STRL (DRAPES) ×4 IMPLANT
DRILL BIT LONG 2.7 (BIT) ×2
DRSG AQUACEL AG ADV 3.5X14 (GAUZE/BANDAGES/DRESSINGS) ×2 IMPLANT
DRSG EMULSION OIL 3X3 NADH (GAUZE/BANDAGES/DRESSINGS) ×2 IMPLANT
DRSG PAD ABDOMINAL 8X10 ST (GAUZE/BANDAGES/DRESSINGS) ×2 IMPLANT
DURAPREP 26ML APPLICATOR (WOUND CARE) ×2 IMPLANT
ELECT REM PT RETURN 15FT ADLT (MISCELLANEOUS) ×2 IMPLANT
EVACUATOR 1/8 PVC DRAIN (DRAIN) IMPLANT
GAUZE SPONGE 4X4 12PLY STRL (GAUZE/BANDAGES/DRESSINGS) ×2 IMPLANT
GLOVE BIO SURGEON STRL SZ7.5 (GLOVE) ×2 IMPLANT
GLOVE BIO SURGEON STRL SZ8.5 (GLOVE) ×2 IMPLANT
GOWN STRL REUS W/TWL LRG LVL3 (GOWN DISPOSABLE) ×2 IMPLANT
KIT BASIN OR (CUSTOM PROCEDURE TRAY) ×2 IMPLANT
MANIFOLD NEPTUNE II (INSTRUMENTS) ×2 IMPLANT
NS IRRIG 1000ML POUR BTL (IV SOLUTION) ×2 IMPLANT
PACK SHOULDER (CUSTOM PROCEDURE TRAY) ×2 IMPLANT
PROS LCP PLATE 14 189M (Plate) ×2 IMPLANT
PROSTHESIS LCP PLATE 14 189M (Plate) ×1 IMPLANT
PROTECTOR NERVE ULNAR (MISCELLANEOUS) ×2 IMPLANT
SCREW CORTEX 3.5 22MM (Screw) ×1 IMPLANT
SCREW LOCK CORT ST 3.5X22 (Screw) ×1 IMPLANT
SCREW LOCK T15 FT 28X3.5X2.9X (Screw) ×3 IMPLANT
SCREW LOCK T15 FT 32X3.5X2.9X (Screw) ×1 IMPLANT
SCREW LOCK T15 FT 60X3.5X2.9X (Screw) ×1 IMPLANT
SCREW LOCKING 3.5 (Screw) ×2 IMPLANT
SCREW LOCKING 3.5X28 (Screw) ×3 IMPLANT
SCREW LOCKING 3.5X32 (Screw) ×1 IMPLANT
SCREW LOCKING 3.5X52MM (Screw) ×4 IMPLANT
SCREW LOCKING 3.5X60 (Screw) ×1 IMPLANT
SLING ARM IMMOBILIZER LRG (SOFTGOODS) ×2 IMPLANT
SPONGE LAP 4X18 RFD (DISPOSABLE) IMPLANT
STAPLER VISISTAT 35W (STAPLE) ×2 IMPLANT
STRIP CLOSURE SKIN 1/2X4 (GAUZE/BANDAGES/DRESSINGS) ×2 IMPLANT
SUCTION FRAZIER HANDLE 10FR (MISCELLANEOUS) ×1
SUCTION TUBE FRAZIER 10FR DISP (MISCELLANEOUS) ×1 IMPLANT
SUT ETHIBOND NAB CT1 #1 30IN (SUTURE) IMPLANT
SUT FIBERTAPE CERCLAGE 2 48 (Miscellaneous) ×8 IMPLANT
SUT FIBERWIRE #2 38 T-5 BLUE (SUTURE)
SUT MNCRL AB 4-0 PS2 18 (SUTURE) ×2 IMPLANT
SUT VIC AB 0 CT1 36 (SUTURE) ×2 IMPLANT
SUT VIC AB 2-0 CT1 27 (SUTURE) ×1
SUT VIC AB 2-0 CT1 TAPERPNT 27 (SUTURE) ×1 IMPLANT
SUTURE FIBERWR #2 38 T-5 BLUE (SUTURE) IMPLANT
SYR CONTROL 10ML LL (SYRINGE) ×2 IMPLANT
WATER STERILE IRR 1000ML POUR (IV SOLUTION) ×2 IMPLANT

## 2019-01-27 NOTE — Transfer of Care (Signed)
Immediate Anesthesia Transfer of Care Note  Patient: Tiffany Arnold  Procedure(s) Performed: OPEN REDUCTION INTERNAL FIXATION (ORIF) LEFT HUMERUS (Left Arm Upper)  Patient Location: PACU  Anesthesia Type:GA combined with regional for post-op pain  Level of Consciousness: drowsy and confused  Airway & Oxygen Therapy: Patient Spontanous Breathing and Patient connected to face mask  Post-op Assessment: Report given to RN and Post -op Vital signs reviewed and stable  Post vital signs: Reviewed and stable  Last Vitals:  Vitals Value Taken Time  BP 177/75 01/27/2019  6:38 PM  Temp    Pulse 107 01/27/2019  6:43 PM  Resp 12 01/27/2019  6:42 PM  SpO2 97 % 01/27/2019  6:43 PM  Vitals shown include unvalidated device data.  Last Pain:  Vitals:   01/27/19 1440  TempSrc:   PainSc: 0-No pain      Patients Stated Pain Goal: 4 (67/34/19 3790)  Complications: No apparent anesthesia complications

## 2019-01-27 NOTE — Anesthesia Procedure Notes (Signed)
Anesthesia Regional Block: Interscalene brachial plexus block   Pre-Anesthetic Checklist: ,, timeout performed, Correct Patient, Correct Site, Correct Laterality, Correct Procedure, Correct Position, site marked, Risks and benefits discussed, pre-op evaluation,  At surgeon's request and post-op pain management  Laterality: Left  Prep: Maximum Sterile Barrier Precautions used, chloraprep       Needles:  Injection technique: Single-shot  Needle Type: Echogenic Stimulator Needle     Needle Length: 5cm  Needle Gauge: 22     Additional Needles:   Procedures:, nerve stimulator,,, ultrasound used (permanent image in chart),,,,   Nerve Stimulator or Paresthesia:  Response: Biceps response,   Additional Responses:   Narrative:  Start time: 01/27/2019 2:28 PM End time: 01/27/2019 2:38 PM Injection made incrementally with aspirations every 5 mL. Anesthesiologist: Roderic Palau, MD  Additional Notes: 2% Lidocaine skin wheel.

## 2019-01-27 NOTE — Anesthesia Procedure Notes (Signed)
Procedure Name: Intubation Date/Time: 01/27/2019 3:39 PM Performed by: Glory Buff, CRNA Pre-anesthesia Checklist: Patient identified, Emergency Drugs available, Suction available and Patient being monitored Patient Re-evaluated:Patient Re-evaluated prior to induction Oxygen Delivery Method: Circle system utilized Preoxygenation: Pre-oxygenation with 100% oxygen Induction Type: IV induction Ventilation: Mask ventilation without difficulty Laryngoscope Size: Miller and 3 Grade View: Grade I Tube type: Oral Tube size: 7.0 mm Number of attempts: 1 Airway Equipment and Method: Stylet and Oral airway Placement Confirmation: ETT inserted through vocal cords under direct vision,  positive ETCO2 and breath sounds checked- equal and bilateral Secured at: 20 cm Tube secured with: Tape Dental Injury: Teeth and Oropharynx as per pre-operative assessment

## 2019-01-27 NOTE — Op Note (Signed)
01/27/2019  6:18 PM  PATIENT:   Tiffany Arnold  78 y.o. female  PRE-OPERATIVE DIAGNOSIS:  left periprosthetic humerus fracture  POST-OPERATIVE DIAGNOSIS: Same  PROCEDURE: Open reduction and internal fixation of left midshaft humerus periprosthetic fracture utilizing a 14 hole Synthes small fragment locking plate, and multiple suture cerclage constructs.  SURGEON:  Marin Shutter M.D.  ASSISTANTS: Jenetta Loges, PA-C  ANESTHESIA:   General endotracheal as well as interscalene block with Exparel  EBL: 150 cc  SPECIMEN: None  Drains: None   PATIENT DISPOSITION:  PACU - hemodynamically stable.    PLAN OF CARE: Admit to inpatient   Brief history:  Tiffany Arnold is a 78 year old female with a remote history of a left shoulder hemiarthroplasty who unfortunately had a recent ground-level fall sustaining a severely displaced periprosthetic midshaft humerus fracture.  Due to the degree of displacement and position in relation to her implant with continued significant pain and instability, she is brought to the operating room this time for planned open reduction and internal fixation  Preoperatively I did counseled Tiffany Arnold regarding treatment options as well as the potential risks versus benefits thereof.  Additionally, her sister, who serves as her healthcare power of attorney, was included in these conversations.  Possible surgical risks were reviewed including bleeding, infection, neurovascular injury, persistent pain, malunion, nonunion, loss of fixation, anesthetic complication, and possible need for additional surgery.  They understand and accept and agree with our planned procedure.  Procedure in detail:  After undergoing routine preop evaluation patient received prophylactic antibiotics and a interscalene block with Exparel was established in the holding area by the anesthesia department.  Patient was placed supine on the operating table underwent smooth induction of a general  endotracheal anesthesia.  Left arm was supported with an arm board in the left shoulder girdle left upper extremity was then sterilely prepped and draped in standard fashion.  Timeout was called.  A longitudinal anterior approach to the left humerus was made through an approximately 20 cm incision beginning proximally at the more distal aspect of her previous shoulder incision and extending distally to just above the elbow flexion crease.  Dissection carried down through skin and subcu tissue to the deep fascial plane which was incised longitudinally.  The long head bicep was identified and retracted medially and the distal aspect the deltopectoral interval was also identified in this interval was then developed proximally and then more distally as the bicep was reflected medially we identified the brachialis which we then divided in the midline exposing the anterior cortex of the humerus and electrocautery was used for hemostasis.  Significant callus formation had developed about the fracture site and there was some comminution and a segmental element to the fracture pattern.  Meticulous dissection was performed to remove the abundant callus and interposed soft tissue so that we could identify the original fracture lines.  Once these were identified a provisional reduction was then obtained and we used 2 suture cerclage constructs from [employer] for provisional fixation and then contoured a 14 hole Synthes locking plate to fit over the anterolateral cortex of the humerus appropriately spanning the segmental fracture site allowing [place] to have 4 holes and 8 cortices distal and proximal to the segmental fracture zone.  Once the plate was contoured and provisionally held with a clamp it was fastened initially with an lag screw and a cerclage suture proximally once we are satisfied with the overall alignment additional fixation was achieved with locking screws both proximally and  distally and proximally along the course of  the plate adjacent to her implant these were all unicortical screws.  Once this was completed a supplemental cerclage was then passed proximally to reinforce the repair and the overall construct was much to our satisfaction.  Final fluoroscopic imaging was then obtained to confirm that there was good alignment at the fracture site good position the hardware normal was much to our satisfaction.  The wound was then copiously irrigated.  Hemostasis was obtained.  The brachialis was then repaired with a running #1 Vicryl suture and the deep fascial planes were then reapproximated with figure-of-eight number Vicryl sutures.  2-0 Vicryl used for the subcu layer and intracuticular 3-0 Monocryl for the skin followed by Dermabond and Aquasol dressing and left arm was then placed into a sling and the patient was awakened, extubated, and taken to the recovery room in stable condition.  Jenetta Loges, PA-C was used as an Environmental consultant throughout this case essential for help with positioning of the patient, positioning of extremity, maintenance of fracture alignment and reduction, wound closure, and intraoperative decision making.  Tiffany Clines Shatima Zalar MD   Contact # [phone]

## 2019-01-27 NOTE — H&P (Signed)
Tiffany Arnold    Chief Complaint: left periprosthetic humerus fracture HPI: The patient is a 78 y.o. female with severely displaced and unstable left periprosthetic humeral shaft fracture  Past Medical History:  Diagnosis Date  . Allergic rhinitis, seasonal   . Arm bruise    left arm down to hand from fall 01-05-2019  . Bilateral renal cysts    simple (monitored by dr Karsten Ro)  . Chronic cystitis    urologist-- dr Karsten Ro  . GAD (generalized anxiety disorder)   . History of cancer chemotherapy 09-09-2016  to 11-12-2016   breast cancer  . History of external beam radiation therapy 12-24-2016  to 02-03-2017   right breast  . History of recurrent UTIs   . Humerus fracture 01/05/2019   left ,  hx total shoulder arthroplasty   . Hyperlipidemia   . Hypertension   . IBS (irritable bowel syndrome)    mixed  . Lymphedema of right upper extremity   . 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 Memory disorder 06/22/2017   with intermittant confusion;  neurologist-- dr Krista Blue (note in epic)  . Mild persistent asthma    no inhaler  . OA (osteoarthritis)   . OSA on CPAP   . Type 2 diabetes, diet controlled ([place])   . Urge incontinence of urine   . Wears glasses     Past Surgical History:  Procedure Laterality Date  . BREAST CYST EXCISION Right 2001   negative  . BREAST LUMPECTOMY WITH NEEDLE LOCALIZATION AND AXILLARY LYMPH NODE DISSECTION Right 07/29/2016   Procedure: RIGHT BREAST LUMPECTOMY WITH DOUBLE NEEDLE LOCALIZATION AND COMPLETE RIGHT AXILLARY LYMPH NODE DISSECTION;  Surgeon: Fanny Skates, MD;  Location: [place];  Service: General;   Laterality: Right;  . BREAST SURGERY Left 2000   Biopsy  . BUNIONECTOMY Bilateral 1998   great toe fusion on right foot  . CATARACT EXTRACTION W/ INTRAOCULAR LENS  IMPLANT, BILATERAL  right,  fall 2019;  left 2017  . COLONOSCOPY W/ POLYPECTOMY    . LUMBAR SPINE SURGERY  1970s  . NASAL SINUS SURGERY  2015  . PORTACATH PLACEMENT N/A 09/05/2016   Procedure: INSERTION PORT-A-CATH;  Surgeon: Fanny Skates, MD;  [facility];  Service: General;  Laterality: N/A;  . TOTAL KNEE ARTHROPLASTY Bilateral 2007  . TOTAL SHOULDER REPLACEMENT Left 2011  . UMBILICAL HERNIA REPAIR  04-06-2014   @[facility]     Family History  Problem Relation Age of Onset  . Arthritis Mother   . Stroke Mother   . Hypertension Mother   . Cancer Father        Prostate  . Stroke Father   . Hypertension Father   . Hypertension Maternal Grandmother   . Rheum arthritis Maternal Grandfather   . Stroke Maternal Grandfather   . Hypertension Maternal Grandfather   . Cancer Paternal Grandmother        Colon  . Hypertension Paternal Grandmother   . Hypertension Paternal Grandfather   . Breast cancer Neg Hx     Social History:  reports that she quit smoking about 27 years ago. She has a 50.00 pack-year smoking history. She  has never used smokeless tobacco. She reports previous alcohol use. She reports that she does not use drugs.   Medications Prior to Admission  Medication Sig Dispense Refill  . acetaminophen (TYLENOL) 500 MG tablet Take 1,000 mg by mouth every 6 (six) hours as needed for moderate pain or headache.     . alendronate (FOSAMAX) 70 MG tablet TAKE 1 TABLET EVERY 7 DAYS WITH A FULL GLASS OF WATER ON AN EMPTY STOMACH DO NOT LIE DOWN FOR AT LEAST 30 MIN (Patient taking differently: Take 70 mg by mouth every Tuesday. ) 4 tablet 5  . ALPRAZolam (XANAX) 0.25 MG tablet TAKE ONE TABLET BY MOUTH TWICE DAILY AS NEEDED (Patient taking differently: Take 0.25 mg by mouth daily as needed for anxiety. ) 60 tablet 0  .  Calcium Carbonate-Vitamin D (CALCIUM-VITAMIN D) 500-200 MG-UNIT tablet Take 1 tablet by mouth daily.    . cetirizine (ZYRTEC) 10 MG tablet Take 10 mg by mouth every evening.    . Cholecalciferol (VITAMIN D-3) 125 MCG (5000 UT) TABS Take 5,000 Units by mouth daily.    . diphenoxylate-atropine (LOMOTIL) 2.5-0.025 MG tablet Take 1 tablet by mouth 4 (four) times daily as needed for diarrhea or loose stools. 30 tablet 5  . exemestane (AROMASIN) 25 MG tablet Take 1 tablet (25 mg total) by mouth daily. 30 tablet 6  . ferrous sulfate 325 (65 FE) MG tablet Take 325 mg by mouth daily with breakfast.    . lisinopril (PRINIVIL,ZESTRIL) 10 MG tablet TAKE ONE TABLET BY MOUTH EVERY DAY (Patient taking differently: Take 10 mg by mouth daily. ) 90 tablet 1  . memantine (NAMENDA TITRATION PACK) tablet pack Take by mouth See admin instructions. 5 mg/day for =1 week; 5 mg twice daily for =1 week; 15 mg/day given in 5 mg and 10 mg separated doses for =1 week; then 10 mg twice daily Call for refill after titration pack concluded 49 tablet 0  . Methylcellulose, Laxative, (CITRUCEL) 500 MG TABS Take 1,500 mg by mouth 3 (three) times daily.     . nitrofurantoin (MACRODANTIN) 100 MG capsule Take 100 mg by mouth at bedtime.     Marland Kitchen nystatin (MYCOSTATIN/NYSTOP) powder Apply topically 3 (three) times daily. (Patient taking differently: Apply 1 g topically 3 (three) times daily as needed (irritated skin). ) 15 g 0  . Probiotic Product (PROBIOTIC PO) Take 1 capsule by mouth daily.     . sertraline (ZOLOFT) 50 MG tablet Take 50 mg by mouth daily.     Marland Kitchen EPINEPHrine 0.3 mg/0.3 mL IJ SOAJ injection Inject 0.3 mg into the muscle once as needed for anaphylaxis.     Marland Kitchen oxyCODONE (ROXICODONE) 5 MG immediate release tablet Take 1 tablet (5 mg total) by mouth every 8 (eight) hours as needed. 20 tablet 0  . traMADol (ULTRAM) 50 MG tablet Take 1 tablet (50 mg total) by mouth every 8 (eight) hours as needed. (Patient taking differently: Take 50  mg by mouth every 8 (eight) hours as needed for moderate pain. ) 90 tablet 0     Physical Exam: left upper extremity with swelling and tenderness with gross instability of mid-humerus, grossly n/v intact on recent exam in office. Vitals  Temp:  [97.7 F (36.5 C)] 97.7 F (36.5 C) (02/06 1254) Pulse Rate:  [70] 70 (02/06 1254) Resp:  [16] 16 (02/06 1254) BP: (96)/(67) 96/67 (02/06 1254) SpO2:  [95 %] 95 % (02/06 1254) Weight:  [99.8 kg] 99.8 kg (02/06 1332)  Assessment/Plan  Impression: left periprosthetic humerus fracture  Plan of Action: Procedure(s): OPEN REDUCTION INTERNAL FIXATION (ORIF) LEFT HUMERUS  Kushal Saunders M Marissa Weaver 01/27/2019, 2:34 PM Contact # [phone]

## 2019-01-27 NOTE — Anesthesia Postprocedure Evaluation (Signed)
Anesthesia Post Note  Patient: Tiffany Arnold  Procedure(s) Performed: OPEN REDUCTION INTERNAL FIXATION (ORIF) LEFT HUMERUS (Left Arm Upper)     Patient location during evaluation: PACU Anesthesia Type: General and Regional Level of consciousness: awake and alert Pain management: pain level controlled Vital Signs Assessment: post-procedure vital signs reviewed and stable Respiratory status: spontaneous breathing, nonlabored ventilation, respiratory function stable and patient connected to nasal cannula oxygen Cardiovascular status: blood pressure returned to baseline and stable Postop Assessment: no apparent nausea or vomiting Anesthetic complications: no    Last Vitals:  Vitals:   01/27/19 1930 01/27/19 1945  BP: (!) 170/61 (!) 166/76  Pulse: (!) 103 (!) 102  Resp: 20 (!) 21  Temp: 37.1 C   SpO2: 95% 96%    Last Pain:  Vitals:   01/27/19 1440  TempSrc:   PainSc: 0-No pain                 Dima Ferrufino,W. EDMOND

## 2019-01-27 NOTE — Plan of Care (Signed)
Plan of care discussed with Patient and daughter Wells Guiles.

## 2019-01-27 NOTE — Progress Notes (Signed)
Assisted Dr. Oren Bracket with left, ultrasound guided, interscalene  block. Side rails up, monitors on throughout procedure. See vital signs in flow sheet. Tolerated Procedure well.

## 2019-01-28 ENCOUNTER — Encounter (HOSPITAL_COMMUNITY): Payer: Self-pay | Admitting: Orthopedic Surgery

## 2019-01-28 MED ORDER — TRAMADOL HCL 50 MG PO TABS: 50.0000 mg | ORAL_TABLET | Freq: Three times a day (TID) | ORAL | 0 refills | Status: DC | PRN

## 2019-01-28 NOTE — Care Management Important Message (Signed)
Important Message  Patient Details  Name: Tiffany Arnold MRN: [id number] Date of Birth: [birth year]   Medicare Important Message Given:  Yes    Kerin Salen 01/28/2019, 11:44 AM

## 2019-01-28 NOTE — NC FL2 (Addendum)
LEVEL OF CARE SCREENING TOOL     IDENTIFICATION  Patient Name: Tiffany Arnold Birthdate: June 15, 1941 Sex: female Admission Date (Current Location): 01/27/2019  [facility] and [place] Number:  Herbalist and Address:  [facility],  [place] [address], [place]      Provider Number: [id number]  Attending Physician Name and Address:  Justice Britain, MD  Relative Name and Phone Number:       Current Level of Care: Hospital Recommended Level of Care: [place] Prior Approval Number:    Date Approved/Denied:   PASRR Number:   [id number]   Discharge Plan:  SNF    Current Diagnoses: Patient Active Problem List   Diagnosis Date Noted  . Humerus shaft fracture 01/27/2019  . Gait abnormality 10/05/2018  . Iron deficiency anemia 07/08/2018  . Recurrent UTI 01/01/2018  . Osteoarthritis 01/01/2018  . Memory disorder 06/22/2017  . Osteoporosis 04/01/2017  . Port catheter in place 12/25/2016  . IBS (irritable bowel syndrome) 11/25/2016  . Breast cancer of lower-inner quadrant of right female breast ([place]) 06/27/2016  . OSA on CPAP 06/26/2016  . Type 2 diabetes mellitus without complication ([place]) 00/86/7619  . Essential hypertension 09/04/2014  . HLD (hyperlipidemia) 09/04/2014  . Gastroesophageal reflux disease without esophagitis 09/04/2014  . Seasonal allergies 09/04/2014  . Generalized anxiety disorder 09/04/2014    Orientation RESPIRATION BLADDER Height & Weight     Self, Time, Situation, Place  Normal Continent Weight: 220 lb (99.8 kg) Height:  5' (152.4 cm)  BEHAVIORAL SYMPTOMS/MOOD NEUROLOGICAL BOWEL NUTRITION STATUS      Continent Diet  AMBULATORY STATUS COMMUNICATION OF NEEDS Skin   Extensive Assist   Surgical wounds                       Personal Care Assistance Level of Assistance  Bathing, Feeding, Dressing Bathing Assistance: Limited assistance Feeding assistance:  Independent Dressing Assistance: Limited assistance     Functional Limitations Info  Sight, Hearing, Speech Sight Info: Impaired Hearing Info: Adequate Speech Info: Adequate    SPECIAL CARE FACTORS FREQUENCY  PT (By licensed PT), OT (By licensed OT)     PT Frequency: 5x/week OT Frequency: 5x/week            Contractures Contractures Info: Not present    Additional Factors Info  Code Status, Allergies Code Status Info: Fullcode  Allergies Info: Allergies: Other Psychotropic Info: zoloft         Current Medications (01/28/2019):  This is the current hospital active medication list Current Facility-Administered Medications  Medication Dose Route Frequency Provider Last Rate Last Dose  . acetaminophen (TYLENOL) tablet 325-650 mg  325-650 mg Oral Q6H PRN Shuford, Olivia Mackie, PA-C   650 mg at 01/28/19 5093  . alum & mag hydroxide-simeth (MAALOX/MYLANTA) 200-200-20 MG/5ML suspension 30 mL  30 mL Oral Q4H PRN Shuford, Tracy, PA-C      . bisacodyl (DULCOLAX) EC tablet 5 mg  5 mg Oral Daily PRN Shuford, Olivia Mackie, PA-C      . diphenhydrAMINE (BENADRYL) 12.5 MG/5ML elixir 12.5-25 mg  12.5-25 mg Oral Q4H PRN Shuford, Tracy, PA-C      . docusate sodium (COLACE) capsule 100 mg  100 mg Oral BID Shuford, Tracy, PA-C   100 mg at 01/28/19 0834  . exemestane (AROMASIN) tablet 25 mg  25 mg Oral QPC breakfast Shuford, Tracy, PA-C   25 mg at 01/28/19 2671  . HYDROmorphone (DILAUDID) injection 0.5-1  mg  0.5-1 mg Intravenous Q4H PRN Shuford, Tracy, PA-C      . lactated ringers infusion   Intravenous Continuous Shuford, Olivia Mackie, PA-C 75 mL/hr at 01/27/19 2015    . lisinopril (PRINIVIL,ZESTRIL) tablet 10 mg  10 mg Oral Daily Shuford, Tracy, PA-C   10 mg at 01/28/19 0834  . magnesium citrate solution 1 Bottle  1 Bottle Oral Once PRN Shuford, Olivia Mackie, PA-C      . menthol-cetylpyridinium (CEPACOL) lozenge 3 mg  1 lozenge Oral PRN Shuford, Olivia Mackie, PA-C       Or  . phenol (CHLORASEPTIC) mouth spray 1 spray  1 spray  Mouth/Throat PRN Shuford, Olivia Mackie, PA-C      . metoCLOPramide (REGLAN) tablet 5-10 mg  5-10 mg Oral Q8H PRN Shuford, Tracy, PA-C       Or  . metoCLOPramide (REGLAN) injection 5-10 mg  5-10 mg Intravenous Q8H PRN Shuford, Tracy, PA-C      . nitrofurantoin (MACRODANTIN) capsule 100 mg  100 mg Oral QHS Shuford, Tracy, PA-C   100 mg at 01/27/19 2229  . ondansetron (ZOFRAN) tablet 4 mg  4 mg Oral Q6H PRN Shuford, Tracy, PA-C       Or  . ondansetron (ZOFRAN) injection 4 mg  4 mg Intravenous Q6H PRN Shuford, Tracy, PA-C      . oxyCODONE (Oxy IR/ROXICODONE) immediate release tablet 10 mg  10 mg Oral Q4H PRN Shuford, Tracy, PA-C      . oxyCODONE (Oxy IR/ROXICODONE) immediate release tablet 5 mg  5 mg Oral Q4H PRN Shuford, Tracy, PA-C      . pantoprazole (PROTONIX) EC tablet 40 mg  40 mg Oral Daily Shuford, Tracy, PA-C   40 mg at 01/28/19 0834  . polyethylene glycol (MIRALAX / GLYCOLAX) packet 17 g  17 g Oral Daily PRN Shuford, Tracy, PA-C      . sertraline (ZOLOFT) tablet 50 mg  50 mg Oral Daily Shuford, Tracy, PA-C   50 mg at 01/28/19 0835  . traMADol (ULTRAM) tablet 50 mg  50 mg Oral Q6H PRN Shuford, Olivia Mackie, PA-C         Discharge Medications: Please see discharge summary for a list of discharge medications.  Relevant Imaging Results:  Relevant Lab Results:   Additional Information SSN:  [id number]  Lia Hopping, LCSW

## 2019-01-28 NOTE — Consult Note (Addendum)
[place] Nurse wound consult note Reason for Consult: Consult requested for bilat breast skin folds Wound type: Pt has red macerated patchy areas of partial thickness skin loss; appearance is consistent with intertrigo and probable candidiasis.   Drainage (amount, consistency, odor) No odor or drainage, partial thickness fissures are not severe, beginning to dry and peel in some areas. Dressing procedure/placement/frequency: Not extensive enough to indicate the use of Interdry; continue present plan of care with antifungal powder to promote drying and healing.  Discussed plan of care with patient; no family present to discuss plan of care. Please re-consult if further assistance is needed.  Thank-you,  Julien Girt MSN, [place], [place], [place], [place]

## 2019-01-28 NOTE — Progress Notes (Signed)
Pt has home CPAP unit at bedside but states that she is not going to use it tonight.  Pt to call if she decides to wear, RT to monitor and assess as needed.

## 2019-01-28 NOTE — Evaluation (Signed)
Occupational Therapy Evaluation Patient Details Name: Tiffany Arnold MRN: [id number] DOB: 08-Jun-1941 Today's Date: 01/28/2019    History of Present Illness s/p ORIF for L shoulder periprosthetic fx.  H/O breast CA, mild dementia   Clinical Impression   This 78 year old female was admitted for the above sx.  No family was available at time of evaluation; pt states she was mod I for adls.  Assisted with UB adls and back to bed. Will follow in acute setting with min A level goals. She needs max A for adls at this time    Follow Up Recommendations  SNF    Equipment Recommendations  3 in 1 bedside commode    Recommendations for Other Services       Precautions / Restrictions Precautions Precautions: Shoulder Type of Shoulder Precautions: sling at all times except for bathing/dressing, elbow exercise.  Can move elbow to fingers only Shoulder Interventions: Shoulder sling/immobilizer Precaution Booklet Issued: Yes (comment) Restrictions Weight Bearing Restrictions: Yes LUE Weight Bearing: Non weight bearing      Mobility Bed Mobility               General bed mobility comments: mod A for back to bed (legs).  Pt used trapeze to help reposition in bed  Transfers                 General transfer comment: pt only scooted up [place]                                           ADL either performed or assessed with clinical judgement   ADL Overall ADL's : Needs assistance/impaired Eating/Feeding: Set up   Grooming: Minimal assistance   Upper Body Bathing: Maximal assistance   Lower Body Bathing: Maximal assistance   Upper Body Dressing : Maximal assistance   Lower Body Dressing: Maximal assistance                 General ADL Comments: pt was sitting EOB; had used commode with NT.  Assisted with bathing under operated arm and changing gown. Pt very talkative and will need reinforcement with education.  She is able to move  fingers and wrist but not elbow.  handout provided.  Provided velfoam extension for thumb loop     Vision         Perception     Praxis      Pertinent Vitals/Pain Pain Assessment: No/denies pain     Hand Dominance Right   Extremity/Trunk Assessment             Communication Communication Communication: No difficulties   Cognition Arousal/Alertness: Awake/alert Behavior During Therapy: WFL for tasks assessed/performed                                   General Comments: h/o mild dementia   General Comments       Exercises     Shoulder Instructions      Home Living Family/patient expects to be discharged to:: Skilled nursing facility Living Arrangements: Spouse/significant other;Children                                      Prior Functioning/Environment Level of  Independence: Independent with assistive device(s)        Comments: pt states she was mod I with adls. Used 4 prong cane.  No family to verify        OT Problem List: Decreased strength;Decreased activity tolerance;Impaired balance (sitting and/or standing);Decreased cognition;Impaired UE functional use;Pain      OT Treatment/Interventions: Self-care/ADL training;DME and/or AE instruction;Patient/family education;Balance training;Therapeutic activities    OT Goals(Current goals can be found in the care plan section) Acute Rehab OT Goals Patient Stated Goal: none stated OT Goal Formulation: With patient Time For Goal Achievement: 02/04/19 Potential to Achieve Goals: Good ADL Goals Pt Will Perform Lower Body Bathing: with min assist;with adaptive equipment;sit to/from stand Pt Will Perform Lower Body Dressing: with min assist;sit to/from stand;with adaptive equipment(pants with reacher) Pt Will Transfer to Toilet: with min guard assist;bedside commode;ambulating;stand pivot transfer Pt Will Perform Toileting - Clothing Manipulation and hygiene: with min assist;sit  to/from stand Additional ADL Goal #1: pt will follow shoulder precautions with min cues during adls  OT Frequency: Min 2X/week   Barriers to D/C:            Co-evaluation              AM-PAC OT "6 Clicks" Daily Activity     Outcome Measure Help from another person eating meals?: A Little Help from another person taking care of personal grooming?: A Little Help from another person toileting, which includes using toliet, bedpan, or urinal?: A Lot Help from another person bathing (including washing, rinsing, drying)?: A Lot Help from another person to put on and taking off regular upper body clothing?: A Lot Help from another person to put on and taking off regular lower body clothing?: A Lot 6 Click Score: 14   End of Session    Activity Tolerance: Patient tolerated treatment well Patient left: in bed;with call bell/phone within reach;with bed alarm set  OT Visit Diagnosis: Muscle weakness (generalized) (M62.81)                Time: 2683-4196 OT Time Calculation (min): 19 min Charges:  OT General Charges $OT Visit: 1 Visit OT Evaluation $OT Eval Low Complexity: 1 Low  Lesle Chris, OTR/L Acute Rehabilitation Services [phone] [facility] pager [phone] office 01/28/2019  [place] 01/28/2019, 10:26 AM

## 2019-01-28 NOTE — Progress Notes (Signed)
Tiffany Arnold  MRN: [id number] DOB/Age: March 22, 1941 78 y.o. [facility] Procedure: Procedure(s) (LRB): OPEN REDUCTION INTERNAL FIXATION (ORIF) LEFT HUMERUS (Left)     Subjective: Awake and alert, no complaints of pain. Her usual state of mild dementia  Vital Signs Temp:  [97.7 F (36.5 C)-98.8 F (37.1 C)] 98.2 F (36.8 C) (02/07 0559) Pulse Rate:  [68-109] 88 (02/07 0559) Resp:  [15-25] 19 (02/07 0559) BP: (96-254)/(60-85) 170/73 (02/07 0559) SpO2:  [89 %-100 %] 97 % (02/07 0559) Weight:  [99.8 kg] 99.8 kg (02/06 1332)  Lab Results Recent Labs    01/26/19 1000  WBC 7.1  HGB 13.8  HCT 46.1*  PLT 287   BMET Recent Labs    01/26/19 1000  NA 142  K 4.9  CL 104  CO2 30  GLUCOSE 161*  BUN 24*  CREATININE 0.64  CALCIUM 10.3   INR  Date Value Ref Range Status  07/08/2017 1.02  Final     Exam Left upper extremity NVI distally to finger extension and digital motion Dressing dry        Plan Social work consult to initiate post op placement Skin care consult ordered  [facility] PA-C  01/28/2019, 8:29 AM Contact # [phone]

## 2019-01-28 NOTE — Clinical Social Work Note (Signed)
Clinical Social Work Assessment  Patient Details  Name: Tiffany Arnold MRN: [id number] Date of Birth: June 04, 1941  Date of referral:  01/28/19               Reason for consult:  Facility Placement, Discharge Planning                Permission sought to share information with:  Facility Art therapist granted to share information::  Yes, Verbal Permission Granted  Name::       Engineer, manufacturing::  Skilled Nursing in [place]   Relationship::  Sister  Contact Information:  [phone], [phone]  Housing/Transportation Living arrangements for the past 2 months:  [place] of Information:  Patient, Siblings Patient Interpreter Needed:  None Criminal Activity/Legal Involvement Pertinent to Current Situation/Hospitalization:  No - Comment as needed Significant Relationships:  Adult Children, Siblings Lives with:  Spouse, Adult Children Do you feel safe going back to the place where you live?  Yes Need for family participation in patient care:  Yes  Care giving concerns:  The patient is a 78 y.o. female with severely displaced and unstable left periprosthetic humeral shaft fracture -Patient hx memory disorder. PT recommends SNF rehab.   Social Worker assessment / plan: CSW met with the patient, daughter and sister at bedside to discuss discharge planning. Patient and family agreeable to rehab placement in the [place] area. CSW explain SNF process. Patient sister reports the patient has been to [facility] in the past for rehab therefore has some understanding of the process.  Per family, prior to the fracture she was fairly independent with bathing and dressing. CSW reached out to facilities in [place] for rehab.   FL2 complete.  PASRR complete.   Plan: SNF   Employment status:  Retired Forensic scientist:    PT Recommendations:  [place] / Referral to community resources:  [place]  Patient/Family's Response to care:  Agreeable and Responding well to care.   Patient/Family's Understanding of and Emotional Response to Diagnosis, Current Treatment, and Prognosis: Patient understands her diagnosis and need for follow up care.   Emotional Assessment Appearance:  Developmentally appropriate Attitude/Demeanor/Rapport:    Affect (typically observed):  Accepting Orientation:  Oriented to Self, Oriented to Place, Oriented to  Time, Oriented to Situation Alcohol / Substance use:  Not Applicable Psych involvement (Current and /or in the community):  No (Comment)  Discharge Needs  Concerns to be addressed:  Discharge Planning Concerns Readmission within the last 30 days:  No Current discharge risk:  Dependent with Mobility, Physical Impairment Barriers to Discharge:  Continued Medical Work up, [place], [place] 01/28/2019, 1:41 PM

## 2019-01-28 NOTE — Progress Notes (Signed)
Patient and family agreeable to [facility] for short rehab. [facility] Medicare authorization is still pending at this time.   Kathrin Greathouse, Marlinda Mike, MSW Clinical Social Worker  [phone] 01/28/2019  3:33 PM

## 2019-01-28 NOTE — Evaluation (Signed)
Physical Therapy Evaluation Patient Details Name: Tiffany Arnold MRN: [id number] DOB: 1941-01-06 Today's Date: 01/28/2019   History of Present Illness  s/p ORIF for L shoulder periprosthetic fx.  H/O breast CA, mild dementia  Clinical Impression  Pt admitted as above and presenting with functional mobility limitations 2* inability to use L UE, balance deficits, post op pain and obesity.  Pt would benefit from follow up rehab at SNF level to maximize IND and safety prior to return home.    Follow Up Recommendations SNF    Equipment Recommendations  None recommended by PT    Recommendations for Other Services OT consult     Precautions / Restrictions Precautions Precautions: Shoulder Type of Shoulder Precautions: sling at all times except for bathing/dressing, elbow exercise.  Can move elbow to fingers only Shoulder Interventions: Shoulder sling/immobilizer Precaution Booklet Issued: Yes (comment) Restrictions Weight Bearing Restrictions: Yes LUE Weight Bearing: Non weight bearing      Mobility  Bed Mobility Overal bed mobility: Needs Assistance Bed Mobility: Supine to Sit     Supine to sit: Min assist;HOB elevated     General bed mobility comments: Pt utilizing rail and with HOB elevated requiring min assist to bring trunk to upright  Transfers Overall transfer level: Needs assistance Equipment used: Quad cane Transfers: Sit to/from Stand Sit to Stand: Min assist         General transfer comment: min assist to steady and VC for use of R UE to self assist  Ambulation/Gait Ambulation/Gait assistance: Min assist;Mod assist Gait Distance (Feet): 85 Feet Assistive device: Quad cane Gait Pattern/deviations: Step-through pattern;Shuffle;Trunk flexed;Staggering left;Staggering right     General Gait Details: Pt unsteady all directions with use of QC; cues for pace, posture and cane placement.  Utilized hallway rail with marked improvement in stability  Stairs             Wheelchair Mobility    Modified Rankin (Stroke Patients Only)       Balance Overall balance assessment: Needs assistance Sitting-balance support: Feet supported;No upper extremity supported Sitting balance-Leahy Scale: Good     Standing balance support: Single extremity supported Standing balance-Leahy Scale: Poor                               Pertinent Vitals/Pain Pain Assessment: No/denies pain    Home Living Family/patient expects to be discharged to:: Skilled nursing facility Living Arrangements: Spouse/significant other;Children                    Prior Function Level of Independence: Independent with assistive device(s)         Comments: Pt states she used RW prior to fall/fracture and was much steadier on feet     Hand Dominance   Dominant Hand: Right    Extremity/Trunk Assessment   Upper Extremity Assessment Upper Extremity Assessment: LUE deficits/detail LUE Deficits / Details: L UE immobilized in sling    Lower Extremity Assessment Lower Extremity Assessment: Overall WFL for tasks assessed       Communication   Communication: No difficulties  Cognition Arousal/Alertness: Awake/alert Behavior During Therapy: WFL for tasks assessed/performed                                   General Comments: h/o mild dementia      General Comments      Exercises  Assessment/Plan    PT Assessment Patient needs continued PT services  PT Problem List Decreased strength;Decreased range of motion;Decreased activity tolerance;Decreased balance;Decreased mobility;Decreased cognition;Decreased knowledge of use of DME;Decreased safety awareness;Obesity;Pain       PT Treatment Interventions DME instruction;Gait training;Functional mobility training;Therapeutic activities;Balance training;Patient/family education    PT Goals (Current goals can be found in the Care Plan section)  Acute Rehab PT Goals Patient  Stated Goal: Get better PT Goal Formulation: With patient Time For Goal Achievement: 02/11/19 Potential to Achieve Goals: Fair    Frequency Min 3X/week   Barriers to discharge        Co-evaluation               AM-PAC PT "6 Clicks" Mobility  Outcome Measure Help needed turning from your back to your side while in a flat bed without using bedrails?: A Little Help needed moving from lying on your back to sitting on the side of a flat bed without using bedrails?: A Lot Help needed moving to and from a bed to a chair (including a wheelchair)?: A Lot Help needed standing up from a chair using your arms (e.g., wheelchair or bedside chair)?: A Little Help needed to walk in hospital room?: A Lot Help needed climbing 3-5 steps with a railing? : A Lot 6 Click Score: 14    End of Session Equipment Utilized During Treatment: Gait belt Activity Tolerance: Patient limited by fatigue Patient left: in chair;with call bell/phone within reach;with chair alarm set;with nursing/sitter in room Nurse Communication: Mobility status PT Visit Diagnosis: History of falling (Z91.81);Difficulty in walking, not elsewhere classified (R26.2)    Time: 6440-3474 PT Time Calculation (min) (ACUTE ONLY): 23 min   Charges:   PT Evaluation $PT Eval Low Complexity: 1 Low          [place] Pager [phone] Office [phone]   Tiffany Arnold 01/28/2019, 11:03 AM

## 2019-01-28 NOTE — Discharge Summary (Signed)
PATIENT ID:      Tiffany Arnold  MRN:     [id number] DOB/AGE:    1941-01-03 / 78 y.o.     DISCHARGE SUMMARY  ADMISSION DATE:    01/27/2019 DISCHARGE DATE:  01/29/2019  ADMISSION DIAGNOSIS: left periprosthetic humerus fracture Past Medical History:  Diagnosis Date  . Allergic rhinitis, seasonal   . Arm bruise    left arm down to hand from fall 01-05-2019  . Bilateral renal cysts    simple (monitored by dr Karsten Ro)  . Chronic cystitis    urologist-- dr Karsten Ro  . GAD (generalized anxiety disorder)   . History of cancer chemotherapy 09-09-2016  to 11-12-2016   breast cancer  . History of external beam radiation therapy 12-24-2016  to 02-03-2017   right breast  . History of recurrent UTIs   . Humerus fracture 01/05/2019   left ,  hx total shoulder arthroplasty   . Hyperlipidemia   . Hypertension   . IBS (irritable bowel syndrome)    mixed  . Lymphedema of right upper extremity   . 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 Memory disorder 06/22/2017   with intermittant confusion;  neurologist-- dr Krista Blue (note in epic)  . Mild persistent asthma    no inhaler  . OA (osteoarthritis)   . OSA on CPAP   . Type 2 diabetes, diet controlled ([place])   . Urge incontinence of urine   . Wears glasses     DISCHARGE DIAGNOSIS:   Active Problems:   Humerus shaft fracture   PROCEDURE: Procedure(s): OPEN REDUCTION INTERNAL FIXATION (ORIF) LEFT HUMERUS on 01/27/2019  CONSULTS:    HISTORY:  See H&P in chart.  HOSPITAL COURSE:  Tiffany Arnold is a 78 y.o. admitted on 01/27/2019 with a diagnosis of left periprosthetic humerus fracture.  They were  brought to the operating room on 01/27/2019 and underwent Procedure(s): OPEN REDUCTION INTERNAL FIXATION (ORIF) LEFT HUMERUS.    They were given perioperative antibiotics:  Anti-infectives (From admission, onward)   Start     Dose/Rate Route Frequency Ordered Stop   01/28/19 0600  ceFAZolin (ANCEF) IVPB 2g/100 mL premix     2 g 200 mL/hr over 30 Minutes Intravenous On call to O.R. 01/27/19 1239 01/28/19 0630    .  Patient underwent the above named procedure and tolerated it well. The following day they were hemodynamically stable and pain was controlled on oral analgesics. They were neurovascularly intact to the operative extremity. OT was ordered and worked with patient per protocol. They were medically and orthopaedically stable for discharge on day 2 to skilled facility .    DIAGNOSTIC STUDIES:  RECENT RADIOGRAPHIC STUDIES :  Ct Head Wo Contrast  Result Date: 01/05/2019 CLINICAL DATA:  Mechanical fall to the left side. Struck head. No loss of consciousness. No blood thinners. EXAM: CT HEAD WITHOUT CONTRAST CT MAXILLOFACIAL WITHOUT CONTRAST CT CERVICAL SPINE WITHOUT CONTRAST TECHNIQUE: Multidetector CT imaging of the head, cervical spine, and maxillofacial structures were performed using the standard protocol without intravenous contrast. Multiplanar CT image reconstructions of the cervical spine and maxillofacial structures were also generated. COMPARISON:  MRI brain  07/06/2017. CT head 07/06/2017. CT sinuses 11/23/2010 FINDINGS: CT HEAD FINDINGS Brain: Diffuse cerebral atrophy. Mild ventricular dilatation consistent with central atrophy. Low-attenuation changes in the deep white matter consistent small vessel ischemia. No change in pattern since previous study. No mass-effect or midline shift. No abnormal extra-axial fluid collections. Gray-white matter junctions are distinct. Basal cisterns are not effaced. No acute intracranial hemorrhage. Vascular: Moderate intracranial arterial vascular  calcifications. Skull: Calvarium appears intact. No acute depressed skull fractures. Other: None. CT MAXILLOFACIAL FINDINGS Osseous: Postoperative changes with bilateral antrectomies and turbinate resections. Degenerative changes in the temporomandibular joints, greater on the right. No acute fracture or dislocation demonstrated in the orbital, facial, or mandibular bones. Orbits: The globes and extraocular muscles appear intact and symmetrical. Sinuses: Mucosal thickening in the maxillary antra bilaterally. No acute air-fluid levels. Mastoid air cells are clear. Soft tissues: No significant soft tissue swelling, gas, or foreign body. Vascular calcifications. CT CERVICAL SPINE FINDINGS Alignment: Motion artifact limits examination. Alignment of the cervical spine and facet joints appears normal. C1-2 articulation appears intact. Skull base and vertebrae: Skull base appears intact. No vertebral compression deformities. No focal bone lesion or bone destruction. Bone cortex appears intact. Soft tissues and spinal canal: No prevertebral soft tissue swelling. No abnormal paraspinal soft tissue mass or infiltration. Disc levels: Degenerative changes throughout the cervical spine with narrowed disc spaces and endplate hypertrophic changes. Degenerative changes in the facet joints. Upper chest: Motion artifact. Lung apices appear clear. Vascular calcifications. Other: None. IMPRESSION: 1. No acute intracranial abnormalities. Chronic atrophy and small vessel ischemic changes. 2. No acute displaced orbital or facial fractures identified. Postoperative changes in the sinuses. Degenerative changes of the temporomandibular joints. 3. Normal alignment of the cervical spine. Diffuse degenerative changes. No acute displaced fractures identified. Electronically Signed   By: Lucienne Capers M.D.   On: 01/05/2019 20:36   Ct Cervical Spine Wo Contrast  Result Date: 01/05/2019 CLINICAL DATA:  Mechanical fall to the left side.  Struck head. No loss of consciousness. No blood thinners. EXAM: CT HEAD WITHOUT CONTRAST CT MAXILLOFACIAL WITHOUT CONTRAST CT CERVICAL SPINE WITHOUT CONTRAST TECHNIQUE: Multidetector CT imaging of the head, cervical spine, and maxillofacial structures were performed using the standard protocol without intravenous contrast. Multiplanar CT image reconstructions of the cervical spine and maxillofacial structures were also generated. COMPARISON:  MRI brain 07/06/2017. CT head 07/06/2017. CT sinuses 11/23/2010 FINDINGS: CT HEAD FINDINGS Brain: Diffuse cerebral atrophy. Mild ventricular dilatation consistent with central atrophy. Low-attenuation changes in the deep white matter consistent small vessel ischemia. No change in pattern since previous study. No mass-effect or midline shift. No abnormal extra-axial fluid collections. Gray-white matter junctions are distinct. Basal cisterns are not effaced. No acute intracranial hemorrhage. Vascular: Moderate intracranial arterial vascular calcifications. Skull: Calvarium appears intact. No acute depressed skull fractures. Other: None. CT MAXILLOFACIAL FINDINGS Osseous: Postoperative changes with bilateral antrectomies and turbinate resections. Degenerative changes in the temporomandibular joints, greater on the right. No acute fracture or dislocation demonstrated in the orbital, facial, or mandibular bones. Orbits: The globes and extraocular muscles appear intact and symmetrical. Sinuses: Mucosal thickening in the maxillary antra bilaterally. No acute air-fluid levels. Mastoid air cells are clear. Soft tissues: No significant soft tissue swelling, gas, or foreign body. Vascular calcifications. CT CERVICAL SPINE FINDINGS Alignment: Motion artifact limits examination. Alignment of the cervical spine and facet joints appears normal. C1-2 articulation appears intact. Skull base and vertebrae: Skull base appears intact. No vertebral compression deformities. No focal bone lesion or  bone destruction. Bone  cortex appears intact. Soft tissues and spinal canal: No prevertebral soft tissue swelling. No abnormal paraspinal soft tissue mass or infiltration. Disc levels: Degenerative changes throughout the cervical spine with narrowed disc spaces and endplate hypertrophic changes. Degenerative changes in the facet joints. Upper chest: Motion artifact. Lung apices appear clear. Vascular calcifications. Other: None. IMPRESSION: 1. No acute intracranial abnormalities. Chronic atrophy and small vessel ischemic changes. 2. No acute displaced orbital or facial fractures identified. Postoperative changes in the sinuses. Degenerative changes of the temporomandibular joints. 3. Normal alignment of the cervical spine. Diffuse degenerative changes. No acute displaced fractures identified. Electronically Signed   By: Lucienne Capers M.D.   On: 01/05/2019 20:36   Dg Shoulder Left  Result Date: 01/05/2019 CLINICAL DATA:  Fall on the left side. Left shoulder pain. History of a left shoulder replacement. EXAM: LEFT SHOULDER - 2+ VIEW COMPARISON:  CT, 03/18/2006 FINDINGS: There is an acute fracture of the proximal humerus, extending from adjacent to the medullary stem of the shoulder prosthesis distally, below the included field of view. Distal fracture component is displaced medially by 1 full shaft width. No other fractures. Left shoulder prosthesis appears well aligned. AC joint is normally aligned. Bones are diffusely demineralized. IMPRESSION: 1. Fracture of the left humeral diaphysis. 2. No left shoulder fracture or dislocation. Electronically Signed   By: Lajean Manes M.D.   On: 01/05/2019 19:56   Dg Knee Complete 4 Views Right  Result Date: 01/05/2019 CLINICAL DATA:  Fall onto the left side.  Right knee pain. EXAM: RIGHT KNEE - COMPLETE 4+ VIEW COMPARISON:  Postop right knee radiographs, [id number] FINDINGS: No fracture or bone lesion. Knee prosthetic components are well-seated and aligned. No evidence of  loosening. No joint effusion. IMPRESSION: 1. No fracture or dislocation. 2. No evidence of loosening of the orthopedic hardware. Electronically Signed   By: Lajean Manes M.D.   On: 01/05/2019 19:57   Dg Humerus Left  Result Date: 01/27/2019 CLINICAL DATA:  LEFT humerus radiograph January 05, 2019. EXAM: LEFT HUMERUS - 2+ VIEW FLUOROSCOPY TIME:  10 seconds COMPARISON:  None. FINDINGS: Two fluoroscopic spot views of the LEFT humerus demonstrating plate and screw fixation of shoulder arthroplasty and fracture. IMPRESSION: Intraoperative imaging of LEFT humerus ORIF. Electronically Signed   By: Elon Alas M.D.   On: 01/27/2019 19:58   Dg Humerus Left  Result Date: 01/05/2019 CLINICAL DATA:  Fall onto the left arm pain.  Side.  Left EXAM: LEFT HUMERUS - 2+ VIEW COMPARISON:  None. FINDINGS: Oblique to spiral fracture of the left humeral diaphysis. Fracture begins along the proximal, medial shaft, adjacent to the intramedullary component of the left shoulder prosthesis. Fracture extends below the tip of the prosthesis to the mid humeral shaft. There is a linear nondisplaced component of the fracture across the midshaft. No fracture comminution. Primary fracture components are displaced, distal fracture component displacing medially by 2.2 cm. Fractures also angulated, distal fracture component angulated posteriorly by 24 degrees. Shoulder prosthesis remains normally aligned. Elbow joint is normally aligned. There is surrounding soft tissue swelling. IMPRESSION: Displaced, angulated fracture of the left humeral diaphysis as described. Electronically Signed   By: Lajean Manes M.D.   On: 01/05/2019 20:00   Dg C-arm 1-60 Min-no Report  Result Date: 01/27/2019 Fluoroscopy was utilized by the requesting physician.  No radiographic interpretation.   Ct Maxillofacial Wo Contrast  Result Date: 01/05/2019 CLINICAL DATA:  Mechanical fall to the left side. Struck head. No loss of consciousness. No blood  thinners. EXAM: CT HEAD WITHOUT CONTRAST CT MAXILLOFACIAL WITHOUT CONTRAST CT CERVICAL SPINE WITHOUT CONTRAST TECHNIQUE: Multidetector CT imaging of the head, cervical spine, and maxillofacial structures were performed using the standard protocol without intravenous contrast. Multiplanar CT image reconstructions of the cervical spine and maxillofacial structures were also generated. COMPARISON:  MRI brain 07/06/2017. CT head 07/06/2017. CT sinuses 11/23/2010 FINDINGS: CT HEAD FINDINGS Brain: Diffuse cerebral atrophy. Mild ventricular dilatation consistent with central atrophy. Low-attenuation changes in the deep white matter consistent small vessel ischemia. No change in pattern since previous study. No mass-effect or midline shift. No abnormal extra-axial fluid collections. Gray-white matter junctions are distinct. Basal cisterns are not effaced. No acute intracranial hemorrhage. Vascular: Moderate intracranial arterial vascular calcifications. Skull: Calvarium appears intact. No acute depressed skull fractures. Other: None. CT MAXILLOFACIAL FINDINGS Osseous: Postoperative changes with bilateral antrectomies and turbinate resections. Degenerative changes in the temporomandibular joints, greater on the right. No acute fracture or dislocation demonstrated in the orbital, facial, or mandibular bones. Orbits: The globes and extraocular muscles appear intact and symmetrical. Sinuses: Mucosal thickening in the maxillary antra bilaterally. No acute air-fluid levels. Mastoid air cells are clear. Soft tissues: No significant soft tissue swelling, gas, or foreign body. Vascular calcifications. CT CERVICAL SPINE FINDINGS Alignment: Motion artifact limits examination. Alignment of the cervical spine and facet joints appears normal. C1-2 articulation appears intact. Skull base and vertebrae: Skull base appears intact. No vertebral compression deformities. No focal bone lesion or bone destruction. Bone cortex appears intact.  Soft tissues and spinal canal: No prevertebral soft tissue swelling. No abnormal paraspinal soft tissue mass or infiltration. Disc levels: Degenerative changes throughout the cervical spine with narrowed disc spaces and endplate hypertrophic changes. Degenerative changes in the facet joints. Upper chest: Motion artifact. Lung apices appear clear. Vascular calcifications. Other: None. IMPRESSION: 1. No acute intracranial abnormalities. Chronic atrophy and small vessel ischemic changes. 2. No acute displaced orbital or facial fractures identified. Postoperative changes in the sinuses. Degenerative changes of the temporomandibular joints. 3. Normal alignment of the cervical spine. Diffuse degenerative changes. No acute displaced fractures identified. Electronically Signed   By: Lucienne Capers M.D.   On: 01/05/2019 20:36    RECENT VITAL SIGNS:   Patient Vitals for the past 24 hrs:  BP Temp Temp src Pulse Resp SpO2  01/28/19 1436 (!) 182/101 98.6 F (37 C) Oral (!) 102 14 95 %  01/28/19 1059 (!) 171/88 97.7 F (36.5 C) Oral 89 16 97 %  01/28/19 0559 (!) 170/73 98.2 F (36.8 C) Oral 88 19 97 %  01/28/19 0252 (!) 157/80 98.4 F (36.9 C) Oral 87 19 97 %  01/27/19 2211 (!) 173/80 98.4 F (36.9 C) Oral 96 19 97 %  01/27/19 2110 (!) 160/83 98.4 F (36.9 C) Oral 98 19 98 %  01/27/19 2008 (!) 164/60 97.9 F (36.6 C) Oral 88 20 97 %  01/27/19 1945 (!) 166/76 - - (!) 102 (!) 21 96 %  01/27/19 1930 (!) 170/61 98.8 F (37.1 C) - (!) 103 20 95 %  01/27/19 1915 (!) 176/62 - - (!) 106 15 94 %  01/27/19 1900 (!) 165/71 - - (!) 109 17 (!) 89 %  01/27/19 1845 (!) 177/75 98.8 F (37.1 C) - (!) 106 (!) 21 97 %  01/27/19 1514 - - - 93 20 96 %  01/27/19 1513 - - - 91 (!) 21 96 %  01/27/19 1512 - - - 96 18 97 %  01/27/19 1511 (!) 218/70 - -  93 18 96 %  01/27/19 1510 - - - 95 (!) 21 96 %  01/27/19 1509 - - - 92 (!) 21 96 %  01/27/19 1508 - - - 92 (!) 21 96 %  01/27/19 1507 - - - 89 (!) 21 97 %  01/27/19 1506  (!) 223/71 - - 92 20 97 %  01/27/19 1505 - - - 95 18 97 %  01/27/19 1504 - - - 91 (!) 25 96 %  01/27/19 1503 - - - 92 17 96 %  01/27/19 1502 - - - 93 (!) 24 97 %  01/27/19 1501 (!) 224/75 - - 93 19 99 %  01/27/19 1500 - - - 92 (!) 25 97 %  01/27/19 1459 - - - 93 (!) 25 96 %  01/27/19 1458 - - - 92 (!) 25 97 %  01/27/19 1457 - - - 90 (!) 23 96 %  .  RECENT EKG RESULTS:    Orders placed or performed during the hospital encounter of 01/26/19  . EKG 12 lead  . EKG 12 lead    DISCHARGE INSTRUCTIONS:    DISCHARGE MEDICATIONS:   Allergies as of 01/28/2019      Reactions   Other Shortness Of Breath, Swelling, Other (See Comments)   Cats, dogs, mold Induced asthma Cats, dogs, mold      Medication List    STOP taking these medications   oxyCODONE 5 MG immediate release tablet Commonly known as:  ROXICODONE     TAKE these medications   acetaminophen 500 MG tablet Commonly known as:  TYLENOL Take 1,000 mg by mouth every 6 (six) hours as needed for moderate pain or headache.   alendronate 70 MG tablet Commonly known as:  FOSAMAX TAKE 1 TABLET EVERY 7 DAYS WITH A FULL GLASS OF WATER ON AN EMPTY STOMACH DO NOT LIE DOWN FOR AT LEAST 30 MIN What changed:    how much to take  how to take this  when to take this  additional instructions   ALPRAZolam 0.25 MG tablet Commonly known as:  XANAX TAKE ONE TABLET BY MOUTH TWICE DAILY AS NEEDED What changed:    when to take this  reasons to take this   calcium-vitamin D 500-200 MG-UNIT tablet Take 1 tablet by mouth daily.   cetirizine 10 MG tablet Commonly known as:  ZYRTEC Take 10 mg by mouth every evening.   CITRUCEL 500 MG Tabs Generic drug:  Methylcellulose (Laxative) Take 1,500 mg by mouth 3 (three) times daily.   diphenoxylate-atropine 2.5-0.025 MG tablet Commonly known as:  LOMOTIL Take 1 tablet by mouth 4 (four) times daily as needed for diarrhea or loose stools.   EPINEPHrine 0.3 mg/0.3 mL Soaj  injection Commonly known as:  EPI-PEN Inject 0.3 mg into the muscle once as needed for anaphylaxis.   exemestane 25 MG tablet Commonly known as:  AROMASIN Take 1 tablet (25 mg total) by mouth daily.   ferrous sulfate 325 (65 FE) MG tablet Take 325 mg by mouth daily with breakfast.   lisinopril 10 MG tablet Commonly known as:  PRINIVIL,ZESTRIL TAKE ONE TABLET BY MOUTH EVERY DAY   memantine tablet pack Commonly known as:  NAMENDA TITRATION PACK Take by mouth See admin instructions. 5 mg/day for =1 week; 5 mg twice daily for =1 week; 15 mg/day given in 5 mg and 10 mg separated doses for =1 week; then 10 mg twice daily Call for refill after titration pack concluded   nitrofurantoin 100 MG  capsule Commonly known as:  MACRODANTIN Take 100 mg by mouth at bedtime.   nystatin powder Commonly known as:  MYCOSTATIN/NYSTOP Apply topically 3 (three) times daily. What changed:    how much to take  when to take this  reasons to take this   PROBIOTIC PO Take 1 capsule by mouth daily.   sertraline 50 MG tablet Commonly known as:  ZOLOFT Take 50 mg by mouth daily.   traMADol 50 MG tablet Commonly known as:  ULTRAM Take 1 tablet (50 mg total) by mouth every 8 (eight) hours as needed. What changed:  reasons to take this   Vitamin D-3 125 MCG (5000 UT) Tabs Take 5,000 Units by mouth daily.       FOLLOW UP VISIT:  in 10-14 days please call for time  DISCHARGE TO: Skilled   DISCHARGE CONDITION:  Stable  Strict non weight bear to left upper extremity  Ok to shower Leave current dressing in place until follow up  OK for elbow wrist and hand ROM and light shoulder motion for hygiene  Sling for protection    Jenetta Loges for Dr. Justice Britain 01/28/2019, 2:56 PM

## 2019-01-28 NOTE — Discharge Instructions (Signed)
Metta Clines. Supple, M.D., F.A.A.O.S. [facility] Specializing in Arthroscopic and Reconstructive Surgery of the Shoulder [phone] [address] [place], [place] [address] - Fax [phone]   POST-OP Shoulder  INSTRUCTIONS  1. Call the office at [phone] to schedule your first post-op appointment 10-14 days from the date of your surgery.  2. The bandage over your incision is waterproof. You may begin showering with this dressing on. You may leave this dressing on until first follow up appointment within 2 weeks. We prefer you leave this dressing in place until follow up however after 5-7 days if you are having itching or skin irritation and would like to remove it you may do so. Go slow and tug at the borders gently to break the bond the dressing has with the skin. At this point if there is no drainage it is okay to go without a bandage or you may cover it with a light guaze and tape. You can also expect significant bruising around your shoulder that will drift down your arm and into your chest wall. This is very normal and should resolve over several days.   3. Wear your sling/immobilizer at all times except to perform the exercises below or to occasionally let your arm dangle by your side to stretch your elbow. You also need to sleep in your sling immobilizer until instructed otherwise. It is ok to remove your sling if you are sitting in a controlled environment and allow your arm to rest in a position of comfort by your side or on your lap with pillows to give your neck and skin a break from the sling. You may remove it to allow arm to dangle by side to shower. If you are up walking around and when you go to sleep at night you need to wear it.  4. Range of motion to your elbow, wrist, and hand are encouraged 3-5 times daily. Exercise to your hand and fingers helps to reduce swelling you may experience.  5. Utilize ice to the shoulder 3-5 times minimum a day and  additionally if you are experiencing pain.  6. Prescriptions for a pain medication and a muscle relaxant are provided for you. It is recommended that if you are experiencing pain that you pain medication alone is not controlling, add the muscle relaxant along with the pain medication which can give additional pain relief. The first 1-2 days is generally the most severe of your pain and then should gradually decrease. As your pain lessens it is recommended that you decrease your use of the pain medications to an "as needed basis'" only and to always comply with the recommended dosages of the pain medications.  7. Pain medications can produce constipation along with their use. If you experience this, the use of an over the counter stool softener or laxative daily is recommended.   8. For additional questions or concerns, please do not hesitate to call the office. If after hours there is an answering service to forward your concerns to the physician on call.  9.Pain control following an exparel block  To help control your post-operative pain you received a nerve block  performed with Exparel which is a long acting anesthetic (numbing agent) which can provide pain relief and sensations of numbness (and relief of pain) in the operative shoulder and arm for up to 3 days. Sometimes it provides mixed relief, meaning you may still have numbness in certain areas of the arm but can still be  able to move  parts of that arm, hand, and fingers. We recommend that your prescribed pain medications  be used as needed. We do not feel it is necessary to "pre medicate" and "stay ahead" of pain.  Taking narcotic pain medications when you are not having any pain can lead to unnecessary and potentially dangerous side effects.    POST-OP EXERCISES  ok to allow arm to dangle and move elbow wrist and hand  No weight bearing to left upper extremity

## 2019-01-29 NOTE — Progress Notes (Signed)
Tiffany Arnold  MRN: [id number] DOB/Age: [birth year] 76 y.o. Physician: Ander Slade, M.D. 2 Days Post-Op Procedure(s) (LRB): OPEN REDUCTION INTERNAL FIXATION (ORIF) LEFT HUMERUS (Left)  Subjective: Resting comfortably, no c/o Vital Signs Temp:  [97.7 F (36.5 C)-98.6 F (37 C)] 98 F (36.7 C) (02/08 0524) Pulse Rate:  [78-102] 78 (02/08 0524) Resp:  [14-16] 16 (02/08 0524) BP: (108-186)/(73-101) 181/79 (02/08 0524) SpO2:  [95 %-97 %] 95 % (02/08 0524)  Lab Results Recent Labs    01/26/19 1000  WBC 7.1  HGB 13.8  HCT 46.1*  PLT 287   BMET Recent Labs    01/26/19 1000  NA 142  K 4.9  CL 104  CO2 30  GLUCOSE 161*  BUN 24*  CREATININE 0.64  CALCIUM 10.3   INR  Date Value Ref Range Status  07/08/2017 1.02  Final     Exam  Dressing dry, n/v intact LUE  Plan D/c to SNF, f/u 2 weeks Jancarlo Biermann M Adylin Hankey 01/29/2019, 7:13 AM    Contact # [phone]

## 2019-01-29 NOTE — Plan of Care (Signed)
Pt to d/c to facility when everything is arranged with nursing. Rn called report to [place] LPN at [facility] without complication. Pt remains stable with no needs. No changes to note.

## 2019-01-29 NOTE — Plan of Care (Signed)
Pt stable at time of assessment. No changes needed to current care plans. Pt to possibly d/c to facility today if insurance approval has come through. RN has reached out to social work who is working on this. No needs at this time.

## 2019-01-29 NOTE — Clinical Social Work Placement (Signed)
Patient discharging to [facility] Room 502. Confirmed bed availability, insurance auth and faxed required docs to SNF. Patient and family aware of discharge plan. Family will transport patient to SNF.   RN call report: [phone], ask for 500 hall.   CLINICAL SOCIAL WORK PLACEMENT  NOTE  Date:  01/29/2019  Patient Details  Name: Tiffany Arnold MRN: [id number] Date of Birth: 1941/09/24  Clinical Social Work is seeking post-discharge placement for this patient at the [place] level of care (*CSW will initial, date and re-position this form in  chart as items are completed):  Yes   Patient/family provided with [facility] Work Department's list of facilities offering this level of care within the geographic area requested by the patient (or if unable, by the patient's family).  Yes   Patient/family informed of their freedom to choose among providers that offer the needed level of care, that participate in Medicare, Medicaid or managed care program needed by the patient, have an available bed and are willing to accept the patient.      Patient/family informed of [facility]'s ownership interest in [facility] and [facility], as well as of the fact that they are under no obligation to receive care at these facilities.  PASRR submitted to EDS on       PASRR number received on       Existing PASRR number confirmed on 01/28/19     FL2 transmitted to all facilities in geographic area requested by pt/family on 01/28/19     FL2 transmitted to all facilities within larger geographic area on 01/28/19     Patient informed that his/her managed care company has contracts with or will negotiate with certain facilities, including the following:        Yes   Patient/family informed of bed offers received.  Patient chooses bed at [facility]     Physician recommends and patient chooses bed at      Patient to be transferred to  [facility] on 01/29/19.  Patient to be transferred to facility by Family     Patient family notified on 01/29/19 of transfer.  Name of family member notified:  RN informed family.     PHYSICIAN       Additional Comment:    _______________________________________________ Greg Cutter, LCSW 01/29/2019, 2:31 PM

## 2019-01-29 NOTE — Progress Notes (Signed)
Pt stable at time of d/c. No needs or complaints at time of d/c.

## 2019-01-29 NOTE — Progress Notes (Signed)
Occupational Therapy Treatment Patient Details Name: Tiffany Arnold MRN: [id number] DOB: [birth year] Today's Date: 01/29/2019    History of present illness s/p ORIF for L shoulder periprosthetic fx.  H/O breast CA, mild dementia   OT comments  Ambulated to bathroom, reviewed shoulder protocol (and provided another copy as hers was not visible), worked on elbow ROM and readjusted sling  Follow Up Recommendations  SNF    Equipment Recommendations  3 in 1 bedside commode    Recommendations for Other Services      Precautions / Restrictions Precautions Precautions: Shoulder Type of Shoulder Precautions: sling at all times except for bathing/dressing, elbow exercise.  Can move elbow to fingers only Shoulder Interventions: Shoulder sling/immobilizer Precaution Booklet Issued: Yes (comment) Restrictions LUE Weight Bearing: Non weight bearing       Mobility Bed Mobility                  Transfers   Equipment used: Quad cane   Sit to Stand: Min assist         General transfer comment: min assist to steady and VC for use of R UE to self assist    Balance                                           ADL either performed or assessed with clinical judgement   ADL                           Toilet Transfer: Minimal assistance;Ambulation;Comfort height toilet(quad cane.  Pt unsteady)             General ADL Comments: reviewed washing under arm, donning shirt and removed sling for elbow exercise.  Repositioned in chair with sling on and pillow support, loosening shoulder strap.  Talked to NT about tightening prior to getting up to walk again     Vision       Perception     Praxis      Cognition Arousal/Alertness: Awake/alert Behavior During Therapy: [facility] for tasks assessed/performed                                   General Comments: h/o mild dementia        Exercises     Shoulder Instructions        General Comments      Pertinent Vitals/ Pain       Pain Assessment: No/denies pain  Home Living                                          Prior Functioning/Environment              Frequency  Min 2X/week        Progress Toward Goals  OT Goals(current goals can now be found in the care plan section)  Progress towards OT goals: Progressing toward goals     Plan      Co-evaluation                 AM-PAC OT "6 Clicks" Daily Activity     Outcome Measure   Help from another person eating meals?: A Little  Help from another person taking care of personal grooming?: A Little Help from another person toileting, which includes using toliet, bedpan, or urinal?: A Lot Help from another person bathing (including washing, rinsing, drying)?: A Lot Help from another person to put on and taking off regular upper body clothing?: A Lot Help from another person to put on and taking off regular lower body clothing?: A Lot 6 Click Score: 14    End of Session    OT Visit Diagnosis: Muscle weakness (generalized) (M62.81)   Activity Tolerance Patient tolerated treatment well   Patient Left in chair;with call bell/phone within reach;with chair alarm set   Nurse Communication          Time: 7342-8768 OT Time Calculation (min): 22 min  Charges: OT General Charges $OT Visit: 1 Visit OT Treatments $Self Care/Home Management : 8-22 mins  Lesle Chris, OTR/L Acute Rehabilitation Services [phone] [facility] pager [phone] office 01/29/2019   [name] 01/29/2019, 12:51 PM

## 2019-04-11 ENCOUNTER — Other Ambulatory Visit: Payer: Self-pay | Admitting: Nurse Practitioner

## 2019-04-12 ENCOUNTER — Telehealth: Payer: Self-pay

## 2019-04-12 NOTE — Telephone Encounter (Signed)
TC to pt in regard to refill request on sertraline medication. Husband ask me to call sister Dahlia Byes [phone]) I let her know that we do not refill this medication and that the medication needed to be refilled by her PCP [name] [phone])

## 2019-04-13 ENCOUNTER — Other Ambulatory Visit: Payer: Self-pay

## 2019-04-13 ENCOUNTER — Emergency Department: Payer: Medicare PPO

## 2019-04-13 ENCOUNTER — Telehealth: Payer: Self-pay

## 2019-04-13 ENCOUNTER — Emergency Department
Admission: EM | Admit: 2019-04-13 | Discharge: 2019-04-13 | Disposition: A | Discharge status: Home / Self Care | Payer: Medicare PPO | Attending: Emergency Medicine | Admitting: Emergency Medicine

## 2019-04-13 DIAGNOSIS — I10 Essential (primary) hypertension: Secondary | ICD-10-CM | POA: Diagnosis not present

## 2019-04-13 DIAGNOSIS — Y9384 Activity, sleeping: Secondary | ICD-10-CM | POA: Insufficient documentation

## 2019-04-13 DIAGNOSIS — S0990XA Unspecified injury of head, initial encounter: Secondary | ICD-10-CM | POA: Insufficient documentation

## 2019-04-13 DIAGNOSIS — S0181XA Laceration without foreign body of other part of head, initial encounter: Secondary | ICD-10-CM | POA: Diagnosis present

## 2019-04-13 DIAGNOSIS — Z87891 Personal history of nicotine dependence: Secondary | ICD-10-CM | POA: Insufficient documentation

## 2019-04-13 DIAGNOSIS — Z23 Encounter for immunization: Secondary | ICD-10-CM | POA: Insufficient documentation

## 2019-04-13 DIAGNOSIS — Y998 Other external cause status: Secondary | ICD-10-CM | POA: Diagnosis not present

## 2019-04-13 DIAGNOSIS — Y92003 Bedroom of unspecified non-institutional (private) residence as the place of occurrence of the external cause: Secondary | ICD-10-CM | POA: Insufficient documentation

## 2019-04-13 DIAGNOSIS — W06XXXA Fall from bed, initial encounter: Secondary | ICD-10-CM | POA: Diagnosis not present

## 2019-04-13 DIAGNOSIS — W19XXXA Unspecified fall, initial encounter: Secondary | ICD-10-CM

## 2019-04-13 DIAGNOSIS — E785 Hyperlipidemia, unspecified: Secondary | ICD-10-CM | POA: Insufficient documentation

## 2019-04-13 DIAGNOSIS — Z79899 Other long term (current) drug therapy: Secondary | ICD-10-CM | POA: Diagnosis not present

## 2019-04-13 MED ORDER — LIDOCAINE HCL (PF) 1 % IJ SOLN
INTRAMUSCULAR | Status: AC
  Filled 2019-04-13: qty 5

## 2019-04-13 MED ORDER — TETANUS-DIPHTH-ACELL PERTUSSIS 5-2.5-18.5 LF-MCG/0.5 IM SUSP
0.5000 mL | Freq: Once | INTRAMUSCULAR | Status: AC
  Administered 2019-04-13: 0.5 mL via INTRAMUSCULAR
  Filled 2019-04-13: qty 0.5

## 2019-04-13 MED ORDER — LIDOCAINE HCL (PF) 1 % IJ SOLN
5.0000 mL | Freq: Once | INTRAMUSCULAR | Status: AC
  Administered 2019-04-13: 5 mL via INTRADERMAL

## 2019-04-13 MED ORDER — CEPHALEXIN 500 MG PO CAPS: 500.0000 mg | ORAL_CAPSULE | Freq: Four times a day (QID) | ORAL | 0 refills | Status: AC

## 2019-04-13 MED ORDER — MELOXICAM 7.5 MG PO TABS: 7.5000 mg | ORAL_TABLET | Freq: Every day | ORAL | 0 refills | Status: DC

## 2019-04-13 NOTE — ED Triage Notes (Signed)
Pt comes from home via EMS after falling out of bed this am. She woke up when hitting floor. No Blood thinners. 175/96. VSS. AOx4. Ambulatory on scene.

## 2019-04-13 NOTE — ED Provider Notes (Signed)
[facility] Emergency Department Provider Note  ____________________________________________  Time seen: Approximately 9:29 AM  I have reviewed the triage vital signs and the nursing notes.   HISTORY  Chief Complaint Fall    HPI Tiffany Arnold is a 78 y.o. female that presents emergency department for evaluation after fall.  Patient rolled out of bed this morning and hit her chin on the nightstand.  Was awake as soon as she hit her chin.  No current pain.  No headache, dizziness, hip pain.   Past Medical History:  Diagnosis Date  . Allergic rhinitis, seasonal   . Arm bruise    left arm down to hand from fall 01-05-2019  . Bilateral renal cysts    simple (monitored by dr Karsten Ro)  . Chronic cystitis    urologist-- dr Karsten Ro  . GAD (generalized anxiety disorder)   . History of cancer chemotherapy 09-09-2016  to 11-12-2016   breast cancer  . History of external beam radiation therapy 12-24-2016  to 02-03-2017   right breast  . History of recurrent UTIs   . Humerus fracture 01/05/2019   left ,  hx total shoulder arthroplasty   . Hyperlipidemia   . Hypertension   . IBS (irritable bowel syndrome)    mixed  . Lymphedema of right upper extremity   . Malignant neoplasm of lower-inner quadrant of right breast of female, estrogen receptor positive [facility])    ONCOLOGIST-- dr Burr Medico;   dx 06/ 2017,  Stage IIB (pT1c N1 cM0), Grade 1-2,  DCIS,  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/  07-29-2016  s/p  right lumpectomy w/ sln dissections (1 out of 16 positve),   completed chemo 11-12-2016,  completed radiation 02-03-2017,  started anti-estrogen therapy 02-17-2017  . Memory disorder 06/22/2017   with intermittant confusion;  neurologist-- dr Krista Blue (note in epic)  . Mild persistent asthma    no inhaler  . OA (osteoarthritis)   . OSA on CPAP   . Type 2 diabetes, diet controlled ([place])   . Urge incontinence of urine   . Wears glasses     Patient Active Problem List   Diagnosis  Date Noted  . Humerus shaft fracture 01/27/2019  . Gait abnormality 10/05/2018  . Iron deficiency anemia 07/08/2018  . Recurrent UTI 01/01/2018  . Osteoarthritis 01/01/2018  . Memory disorder 06/22/2017  . Osteoporosis 04/01/2017  . Port catheter in place 12/25/2016  . IBS (irritable bowel syndrome) 11/25/2016  . Breast cancer of lower-inner quadrant of right female breast ([place]) 06/27/2016  . OSA on CPAP 06/26/2016  . Type 2 diabetes mellitus without complication ([place]) 29/56/2130  . Essential hypertension 09/04/2014  . HLD (hyperlipidemia) 09/04/2014  . Gastroesophageal reflux disease without esophagitis 09/04/2014  . Seasonal allergies 09/04/2014  . Generalized anxiety disorder 09/04/2014    Past Surgical History:  Procedure Laterality Date  . BREAST CYST EXCISION Right 2001   negative  . BREAST LUMPECTOMY WITH NEEDLE LOCALIZATION AND AXILLARY LYMPH NODE DISSECTION Right 07/29/2016   Procedure: RIGHT BREAST LUMPECTOMY WITH DOUBLE NEEDLE LOCALIZATION AND COMPLETE RIGHT AXILLARY LYMPH NODE DISSECTION;  Surgeon: Fanny Skates, MD;  Location: [place];  Service: General;  Laterality: Right;  . BREAST SURGERY Left 2000   Biopsy  . BUNIONECTOMY Bilateral 1998   great toe fusion on right foot  . CATARACT EXTRACTION W/ INTRAOCULAR LENS  IMPLANT, BILATERAL  right,  fall 2019;  left 2017  . COLONOSCOPY W/ POLYPECTOMY    . LUMBAR SPINE SURGERY  1970s  . NASAL SINUS SURGERY  2015  . ORIF HUMERUS FRACTURE Left 01/27/2019   Procedure: OPEN REDUCTION INTERNAL FIXATION (ORIF) LEFT HUMERUS;  Surgeon: Justice Britain, MD;  Location: [facility];  Service: Orthopedics;  Laterality: Left;  . PORTACATH PLACEMENT N/A 09/05/2016   Procedure: INSERTION PORT-A-CATH;  Surgeon: Fanny Skates, MD;  Location: [facility];  Service: General;  Laterality: N/A;  . TOTAL KNEE ARTHROPLASTY Bilateral 2007  . TOTAL SHOULDER REPLACEMENT Left 2011  . UMBILICAL HERNIA REPAIR  04-06-2014   _0     Prior to Admission medications    Medication Sig Start Date End Date Taking? Authorizing Provider  acetaminophen (TYLENOL) 500 MG tablet Take 1,000 mg by mouth every 6 (six) hours as needed for moderate pain or headache.     [provider]  alendronate (FOSAMAX) 70 MG tablet TAKE 1 TABLET EVERY 7 DAYS WITH A FULL GLASS OF WATER ON AN EMPTY STOMACH DO NOT LIE DOWN FOR AT LEAST 30 MIN Patient taking differently: Take 70 mg by mouth every Tuesday.  01/24/19   Truitt Merle, MD  ALPRAZolam Duanne Moron) 0.25 MG tablet TAKE ONE TABLET BY MOUTH TWICE DAILY AS NEEDED Patient taking differently: Take 0.25 mg by mouth daily as needed for anxiety.  11/26/16   [name], NP  Calcium Carbonate-Vitamin D (CALCIUM-VITAMIN D) 500-200 MG-UNIT tablet Take 1 tablet by mouth daily.    [provider]  cephALEXin (KEFLEX) 500 MG capsule Take 1 capsule (500 mg total) by mouth 4 (four) times daily for 10 days. 04/13/19 04/23/19  Laban Emperor, PA-C  cetirizine (ZYRTEC) 10 MG tablet Take 10 mg by mouth every evening.    [provider]  Cholecalciferol (VITAMIN D-3) 125 MCG (5000 UT) TABS Take 5,000 Units by mouth daily.    [provider]  diphenoxylate-atropine (LOMOTIL) 2.5-0.025 MG tablet Take 1 tablet by mouth 4 (four) times daily as needed for diarrhea or loose stools. 08/25/18   [name], NP  EPINEPHrine 0.3 mg/0.3 mL IJ SOAJ injection Inject 0.3 mg into the muscle once as needed for anaphylaxis.     [provider]  exemestane (AROMASIN) 25 MG tablet Take 1 tablet (25 mg total) by mouth daily. 01/20/19   Truitt Merle, MD  ferrous sulfate 325 (65 FE) MG tablet Take 325 mg by mouth daily with breakfast.    [provider]  lisinopril (PRINIVIL,ZESTRIL) 10 MG tablet TAKE ONE TABLET BY MOUTH EVERY DAY Patient taking differently: Take 10 mg by mouth daily.  08/27/18   [name], NP  meloxicam (MOBIC) 7.5 MG tablet Take 1 tablet (7.5 mg total) by mouth daily. 04/13/19 04/12/20  Laban Emperor, PA-C   memantine [facility] TITRATION PACK) tablet pack Take by mouth See admin instructions. 5 mg/day for =1 week; 5 mg twice daily for =1 week; 15 mg/day given in 5 mg and 10 mg separated doses for =1 week; then 10 mg twice daily Call for refill after titration pack concluded 10/05/18   Dennie Bible, NP  Methylcellulose, Laxative, (CITRUCEL) 500 MG TABS Take 1,500 mg by mouth 3 (three) times daily.     [provider]  nitrofurantoin (MACRODANTIN) 100 MG capsule Take 100 mg by mouth at bedtime.  01/21/18   [provider]  nystatin (MYCOSTATIN/NYSTOP) powder Apply topically 3 (three) times daily. Patient taking differently: Apply 1 g topically 3 (three) times daily as needed (irritated skin).  07/22/18   Alla Feeling, NP  Probiotic Product (PROBIOTIC PO) Take 1 capsule by mouth daily.  [provider]  sertraline (ZOLOFT) 50 MG tablet Take 50 mg by mouth daily.  06/30/18   [provider]  traMADol (ULTRAM) 50 MG tablet Take 1 tablet (50 mg total) by mouth every 8 (eight) hours as needed. 01/28/19   Shuford, Olivia Mackie, PA-C    Allergies Other  Family History  Problem Relation Age of Onset  . Arthritis Mother   . Stroke Mother   . Hypertension Mother   . Cancer Father        Prostate  . Stroke Father   . Hypertension Father   . Hypertension Maternal Grandmother   . Rheum arthritis Maternal Grandfather   . Stroke Maternal Grandfather   . Hypertension Maternal Grandfather   . Cancer Paternal Grandmother        Colon  . Hypertension Paternal Grandmother   . Hypertension Paternal Grandfather   . Breast cancer Neg Hx     Social History Social History   Tobacco Use  . Smoking status: Former Smoker    Packs/day: 2.00    Years: 25.00    Pack years: 50.00    Last attempt to quit: 12/23/1991    Years since quitting: 27.3  . Smokeless tobacco: Never Used  Substance Use Topics  . Alcohol use: Not Currently  . Drug use: No     Review of Systems   Cardiovascular: No chest pain. Respiratory: No SOB. Gastrointestinal: No abdominal pain.  No nausea, no vomiting.  Musculoskeletal: Negative for musculoskeletal pain.  Skin: Negative for rash, abrasions, lacerations, ecchymosis. Neurological: Negative for headaches, numbness or tingling   ____________________________________________   PHYSICAL EXAM:  VITAL SIGNS: ED Triage Vitals  Enc Vitals Group     BP 04/13/19 0744 (!) 166/67     Pulse Rate 04/13/19 0736 70     Resp 04/13/19 0736 16     Temp 04/13/19 0736 98.1 F (36.7 C)     Temp Source 04/13/19 0736 Oral     SpO2 04/13/19 0736 96 %     Weight 04/13/19 0740 220 lb (99.8 kg)     Height 04/13/19 0740 5' (1.524 m)     Head Circumference --      Peak Flow --      Pain Score 04/13/19 0739 8     Pain Loc --      Pain Edu? --      Excl. in [place]? --      Constitutional: Alert and oriented. Well appearing and in no acute distress. Eyes: Conjunctivae are normal. PERRL. EOMI. Head: Atraumatic. ENT:      Ears:      Nose: No congestion/rhinnorhea.      Mouth/Throat: Mucous membranes are moist.  Neck: No stridor.  No cervical spine tenderness to palpation. Cardiovascular: Normal rate, regular rhythm.  Good peripheral circulation. Respiratory: Normal respiratory effort without tachypnea or retractions. Lungs CTAB. Good air entry to the bases with no decreased or absent breath sounds. Gastrointestinal: Bowel sounds 4 quadrants. Soft and nontender to palpation. No guarding or rigidity. No palpable masses. No distention.  Musculoskeletal: Full range of motion to all extremities. No gross deformities appreciated.  Full range of motion of bilateral hips without pain. Weightbearing. Neurologic:  Normal speech and language. No gross focal neurologic deficits are appreciated.  Skin:  Skin is warm, dry and intact. No rash noted. Psychiatric: Mood and affect are normal. Speech and behavior are normal. Patient exhibits appropriate insight  and judgement.   ____________________________________________   LABS (all labs ordered  are listed, but only abnormal results are displayed)  Labs Reviewed - No data to display ____________________________________________  EKG   ____________________________________________  RADIOLOGY Robinette Haines, personally viewed and evaluated these images (plain radiographs) as part of my medical decision making, as well as reviewing the written report by the radiologist.  Ct Head Wo Contrast  Result Date: 04/13/2019 CLINICAL DATA:  78 year old who fell out of her bed at home while sleeping. Patient woke up after hitting the floor. EXAM: CT HEAD WITHOUT CONTRAST CT CERVICAL SPINE WITHOUT CONTRAST TECHNIQUE: Multidetector CT imaging of the head and cervical spine was performed following the standard protocol without intravenous contrast. Multiplanar CT image reconstructions of the cervical spine were also generated. COMPARISON:  CT head 01/05/2019 and earlier. CT cervical spine 01/05/2019. FINDINGS: CT HEAD FINDINGS Brain: Moderate age related cortical and deep atrophy, progressive since 2013 the relatively stable over the past 2 years. Moderate to severe changes of small vessel disease of the white matter diffusely, unchanged even dating back to 2013. No mass lesion. No midline shift. No acute hemorrhage or hematoma. No extra-axial fluid collections. No evidence of acute infarction. Note is made of a partial empty sella. Vascular: Moderate BILATERAL carotid siphon atherosclerosis. No hyperdense vessel. Skull: No skull fracture or other focal osseous abnormality involving the skull. Sinuses/Orbits: Prior BILATERAL ethmoidectomies and BILATERAL maxillary MEDIAL antrectomies. Minimal, insignificant mucosal thickening involving the BILATERAL maxillary sinuses. BILATERAL mastoid air cells and BILATERAL middle ear cavities well-aerated. Visualized orbits and globes unremarkable. Other: None. CT CERVICAL SPINE  FINDINGS Alignment: Straightening of the usual cervical lordosis. Anatomic POSTERIOR alignment. Facet joints anatomically aligned throughout with diffuse degenerative changes. Skull base and vertebrae: No fractures identified involving the cervical spine. Coronal reformatted images demonstrate an intact craniocervical junction, intact dens and intact lateral masses throughout. Soft tissues and spinal canal: No evidence of paraspinous or canal hematoma. Mild-to-moderate spinal stenosis at C5-6 and C6-7. Disc levels: Severe disc space narrowing and associated endplate hypertrophic changes at C5-6 and C6-7. Moderate disc space narrowing at C3-4. Mild disc space narrowing at C4-5. Degenerative changes at the C1-C2 articulation. Combination of facet and uncinate hypertrophy account for multilevel foraminal stenoses including severe BILATERAL C3-4, moderate LEFT C4-5, severe BILATERAL C5-6 and severe BILATERAL C6-7. Upper chest: Visualized lung apices clear. Visualized superior mediastinum normal. Other: None. IMPRESSION: 1. No acute intracranial abnormality. 2. Moderate age related generalized atrophy and moderate to severe chronic microvascular ischemic changes of the white matter. 3. No cervical spine fractures identified. 4. Multilevel degenerative disc disease, spondylosis and facet degenerative changes with multilevel foraminal stenoses as detailed above. Mild-to-moderate spinal stenosis at C5-6 and C6-7. Electronically Signed   By: Evangeline Dakin M.D.   On: 04/13/2019 08:51   Ct Cervical Spine Wo Contrast  Result Date: 04/13/2019 CLINICAL DATA:  78 year old who fell out of her bed at home while sleeping. Patient woke up after hitting the floor. EXAM: CT HEAD WITHOUT CONTRAST CT CERVICAL SPINE WITHOUT CONTRAST TECHNIQUE: Multidetector CT imaging of the head and cervical spine was performed following the standard protocol without intravenous contrast. Multiplanar CT image reconstructions of the cervical spine  were also generated. COMPARISON:  CT head 01/05/2019 and earlier. CT cervical spine 01/05/2019. FINDINGS: CT HEAD FINDINGS Brain: Moderate age related cortical and deep atrophy, progressive since 2013 the relatively stable over the past 2 years. Moderate to severe changes of small vessel disease of the white matter diffusely, unchanged even dating back to 2013. No mass lesion. No midline shift. No acute  hemorrhage or hematoma. No extra-axial fluid collections. No evidence of acute infarction. Note is made of a partial empty sella. Vascular: Moderate BILATERAL carotid siphon atherosclerosis. No hyperdense vessel. Skull: No skull fracture or other focal osseous abnormality involving the skull. Sinuses/Orbits: Prior BILATERAL ethmoidectomies and BILATERAL maxillary MEDIAL antrectomies. Minimal, insignificant mucosal thickening involving the BILATERAL maxillary sinuses. BILATERAL mastoid air cells and BILATERAL middle ear cavities well-aerated. Visualized orbits and globes unremarkable. Other: None. CT CERVICAL SPINE FINDINGS Alignment: Straightening of the usual cervical lordosis. Anatomic POSTERIOR alignment. Facet joints anatomically aligned throughout with diffuse degenerative changes. Skull base and vertebrae: No fractures identified involving the cervical spine. Coronal reformatted images demonstrate an intact craniocervical junction, intact dens and intact lateral masses throughout. Soft tissues and spinal canal: No evidence of paraspinous or canal hematoma. Mild-to-moderate spinal stenosis at C5-6 and C6-7. Disc levels: Severe disc space narrowing and associated endplate hypertrophic changes at C5-6 and C6-7. Moderate disc space narrowing at C3-4. Mild disc space narrowing at C4-5. Degenerative changes at the C1-C2 articulation. Combination of facet and uncinate hypertrophy account for multilevel foraminal stenoses including severe BILATERAL C3-4, moderate LEFT C4-5, severe BILATERAL C5-6 and severe BILATERAL  C6-7. Upper chest: Visualized lung apices clear. Visualized superior mediastinum normal. Other: None. IMPRESSION: 1. No acute intracranial abnormality. 2. Moderate age related generalized atrophy and moderate to severe chronic microvascular ischemic changes of the white matter. 3. No cervical spine fractures identified. 4. Multilevel degenerative disc disease, spondylosis and facet degenerative changes with multilevel foraminal stenoses as detailed above. Mild-to-moderate spinal stenosis at C5-6 and C6-7. Electronically Signed   By: Evangeline Dakin M.D.   On: 04/13/2019 08:51    ____________________________________________    PROCEDURES  Procedure(s) performed:    Procedures  LACERATION REPAIR Performed by: Laban Emperor  Consent: Verbal consent obtained.  Consent given by: patient  Prepped and Draped in normal sterile fashion  Wound explored: No foreign bodies   Laceration Location: chin  Laceration Length: 2 cm  Anesthesia: None  Local anesthetic: lidocaine 1% without epinephrine  Anesthetic total: 6 ml  Irrigation method: syringe  Amount of cleaning: 580m normal saline  Skin closure: 5-0 nylon  Number of sutures: 5  Technique: Simple interrupted  Patient tolerance: Patient tolerated the procedure well with no immediate complications.   Medications  lidocaine (PF) (XYLOCAINE) 1 % injection 5 mL (5 mLs Intradermal Given by Other 04/13/19 0848)  lidocaine (PF) (XYLOCAINE) 1 % injection 5 mL (5 mLs Intradermal Given by Other 04/13/19 0900)  Tdap (BOOSTRIX) injection 0.5 mL (0.5 mLs Intramuscular Given 04/13/19 0928)     ____________________________________________   INITIAL IMPRESSION / ASSESSMENT AND PLAN / ED COURSE  Pertinent labs & imaging results that were available during my care of the patient were reviewed by me and considered in my medical decision making (see chart for details).  Review of the [place] CSRS was performed [place] of the [place]  to [place] any controlled drugs.     Patient's diagnosis is consistent with facial laceration.  CT scans are negative for acute abnormalities.  CT results were discussed with the patient.  Laceration was repaired with stitches.  Patient denies any additional complaints or concerns at this time.  She is very talkative and enjoys talking about her shih tzu and her previous dog grooming business.  Patient will be discharged home with prescriptions for keflex and mobic. Patient is to follow up with PCP as directed. Patient is given ED precautions to return to the ED for  any worsening or new symptoms.     ____________________________________________  FINAL CLINICAL IMPRESSION(S) / ED DIAGNOSES  Final diagnoses:  Fall, initial encounter      NEW MEDICATIONS STARTED DURING THIS VISIT:  ED Discharge Orders         Ordered    cephALEXin (KEFLEX) 500 MG capsule  4 times daily     04/13/19 0925    meloxicam (MOBIC) 7.5 MG tablet  Daily     04/13/19 0925              This chart was dictated using voice recognition software/Dragon. Despite best efforts to proofread, errors can occur which can change the meaning. Any change was purely unintentional.    Laban Emperor, PA-C 04/13/19 1443    Nena Polio, MD 04/13/19 [phone]

## 2019-04-13 NOTE — Telephone Encounter (Signed)
[name] said that since Feb 2020 pt has fallen x 2 out of bed. Feb 2020 fx arm and fell out of bed again this morning and has lacerations on face and neck that required sutures at [facility] ED. [facility] requesting hospital bed with side rails. Advised would need face to face visit; R Baity NP said could do virtual visit. [name] said she is not with pt and does not want to go to her home to help with virtual visit due to social distancing. [name] will speak with pts daughter who is at work now and one of them will cb to scheduled virtual visit with Avie Echevaria NP. Juluis Rainier to Avie Echevaria NP.

## 2019-04-13 NOTE — ED Notes (Signed)
Patient transported to CT 

## 2019-04-13 NOTE — Telephone Encounter (Signed)
noted 

## 2019-04-13 NOTE — ED Notes (Signed)
Pt's husband contacted per this RN, states he will be on his way to provide transportation home.

## 2019-04-15 ENCOUNTER — Other Ambulatory Visit: Payer: Self-pay | Admitting: Internal Medicine

## 2019-04-19 NOTE — Telephone Encounter (Signed)
Best number [phone] megan @ [facility] Checking on rx they have not heard back from office

## 2019-05-03 ENCOUNTER — Telehealth: Payer: Self-pay | Admitting: Neurology

## 2019-05-03 NOTE — Telephone Encounter (Signed)
Called the patient's sister who helps with medical apts. Unable to complete VV and would like to reschedule.i have pushed the apt out until July

## 2019-05-04 ENCOUNTER — Ambulatory Visit: Payer: Medicare PPO | Admitting: Neurology

## 2019-06-08 ENCOUNTER — Other Ambulatory Visit: Payer: Self-pay | Admitting: Internal Medicine

## 2019-06-16 DIAGNOSIS — H40013 Open angle with borderline findings, low risk, bilateral: Secondary | ICD-10-CM | POA: Diagnosis not present

## 2019-06-16 DIAGNOSIS — H35033 Hypertensive retinopathy, bilateral: Secondary | ICD-10-CM | POA: Diagnosis not present

## 2019-06-16 DIAGNOSIS — H26491 Other secondary cataract, right eye: Secondary | ICD-10-CM | POA: Diagnosis not present

## 2019-06-16 DIAGNOSIS — H2640 Unspecified secondary cataract: Secondary | ICD-10-CM | POA: Diagnosis not present

## 2019-06-16 DIAGNOSIS — H353132 Nonexudative age-related macular degeneration, bilateral, intermediate dry stage: Secondary | ICD-10-CM | POA: Diagnosis not present

## 2019-06-16 LAB — HM DIABETES EYE EXAM

## 2019-06-20 ENCOUNTER — Encounter: Payer: Self-pay | Admitting: Internal Medicine

## 2019-06-21 DIAGNOSIS — S42202A Unspecified fracture of upper end of left humerus, initial encounter for closed fracture: Secondary | ICD-10-CM | POA: Diagnosis not present

## 2019-06-21 DIAGNOSIS — Z4789 Encounter for other orthopedic aftercare: Secondary | ICD-10-CM | POA: Diagnosis not present

## 2019-06-28 ENCOUNTER — Telehealth: Payer: Self-pay | Admitting: Hematology

## 2019-06-28 NOTE — Telephone Encounter (Signed)
Called patient per 7/7 sch message to she if she wants to change her OV to a virtual visit (webex, mychart or doximity) on 7/24.   I called all the numbers that was on the patient chart.  Patient did not answer, kept appt as is.

## 2019-06-29 ENCOUNTER — Telehealth: Payer: Self-pay | Admitting: Neurology

## 2019-06-29 NOTE — Telephone Encounter (Signed)
Called patient and LVM regarding rescheduling her 7/13 appt to a virtual visit or rescheduling to a different day, due to Sarah's schedule change. She is only seeing virtual visits on Monday afternoons. Requested patient to call back to discuss. Office contact info provided.

## 2019-06-30 LAB — HM DIABETES EYE EXAM

## 2019-07-04 ENCOUNTER — Ambulatory Visit: Payer: Self-pay | Admitting: Neurology

## 2019-07-14 ENCOUNTER — Ambulatory Visit (INDEPENDENT_AMBULATORY_CARE_PROVIDER_SITE_OTHER): Payer: Medicare PPO | Admitting: Internal Medicine

## 2019-07-14 ENCOUNTER — Encounter: Payer: Self-pay | Admitting: Internal Medicine

## 2019-07-14 ENCOUNTER — Ambulatory Visit (INDEPENDENT_AMBULATORY_CARE_PROVIDER_SITE_OTHER)
Admission: RE | Admit: 2019-07-14 | Discharge: 2019-07-14 | Disposition: A | Discharge status: Home / Self Care | Payer: Medicare PPO | Source: Ambulatory Visit | Attending: Internal Medicine | Admitting: Internal Medicine

## 2019-07-14 ENCOUNTER — Other Ambulatory Visit: Payer: Self-pay

## 2019-07-14 VITALS — BP 150/92 | HR 100 | Temp 98.3°F | Ht 60.0 in | Wt 236.0 lb

## 2019-07-14 DIAGNOSIS — K219 Gastro-esophageal reflux disease without esophagitis: Secondary | ICD-10-CM

## 2019-07-14 DIAGNOSIS — J302 Other seasonal allergic rhinitis: Secondary | ICD-10-CM

## 2019-07-14 DIAGNOSIS — R0602 Shortness of breath: Secondary | ICD-10-CM | POA: Diagnosis not present

## 2019-07-14 DIAGNOSIS — G4733 Obstructive sleep apnea (adult) (pediatric): Secondary | ICD-10-CM | POA: Diagnosis not present

## 2019-07-14 DIAGNOSIS — I10 Essential (primary) hypertension: Secondary | ICD-10-CM

## 2019-07-14 DIAGNOSIS — Z0000 Encounter for general adult medical examination without abnormal findings: Secondary | ICD-10-CM

## 2019-07-14 DIAGNOSIS — R413 Other amnesia: Secondary | ICD-10-CM

## 2019-07-14 DIAGNOSIS — D508 Other iron deficiency anemias: Secondary | ICD-10-CM

## 2019-07-14 DIAGNOSIS — F411 Generalized anxiety disorder: Secondary | ICD-10-CM | POA: Diagnosis not present

## 2019-07-14 DIAGNOSIS — Z9989 Dependence on other enabling machines and devices: Secondary | ICD-10-CM

## 2019-07-14 DIAGNOSIS — E119 Type 2 diabetes mellitus without complications: Secondary | ICD-10-CM

## 2019-07-14 DIAGNOSIS — Z1321 Encounter for screening for nutritional disorder: Secondary | ICD-10-CM | POA: Diagnosis not present

## 2019-07-14 DIAGNOSIS — M810 Age-related osteoporosis without current pathological fracture: Secondary | ICD-10-CM

## 2019-07-14 DIAGNOSIS — K580 Irritable bowel syndrome with diarrhea: Secondary | ICD-10-CM

## 2019-07-14 DIAGNOSIS — N390 Urinary tract infection, site not specified: Secondary | ICD-10-CM

## 2019-07-14 DIAGNOSIS — R300 Dysuria: Secondary | ICD-10-CM | POA: Diagnosis not present

## 2019-07-14 DIAGNOSIS — Z170 Estrogen receptor positive status [ER+]: Secondary | ICD-10-CM

## 2019-07-14 DIAGNOSIS — E7800 Pure hypercholesterolemia, unspecified: Secondary | ICD-10-CM | POA: Diagnosis not present

## 2019-07-14 DIAGNOSIS — C50311 Malignant neoplasm of lower-inner quadrant of right female breast: Secondary | ICD-10-CM | POA: Diagnosis not present

## 2019-07-14 DIAGNOSIS — M47816 Spondylosis without myelopathy or radiculopathy, lumbar region: Secondary | ICD-10-CM

## 2019-07-14 LAB — POC URINALSYSI DIPSTICK (AUTOMATED)
Bilirubin, UA: NEGATIVE
Glucose, UA: NEGATIVE
Nitrite, UA: NEGATIVE
Protein, UA: POSITIVE — AB
Spec Grav, UA: 1.02 (ref 1.010–1.025)
Urobilinogen, UA: 1 E.U./dL
pH, UA: 6 (ref 5.0–8.0)

## 2019-07-14 MED ORDER — NITROFURANTOIN MONOHYD MACRO 100 MG PO CAPS: 100.0000 mg | ORAL_CAPSULE | Freq: Two times a day (BID) | ORAL | 0 refills | Status: DC

## 2019-07-14 MED ORDER — LISINOPRIL 20 MG PO TABS: 20.0000 mg | ORAL_TABLET | Freq: Every day | ORAL | 3 refills | Status: DC

## 2019-07-14 MED ORDER — SERTRALINE HCL 100 MG PO TABS: 100.0000 mg | ORAL_TABLET | Freq: Every day | ORAL | 3 refills | Status: AC

## 2019-07-14 NOTE — Assessment & Plan Note (Signed)
CBC today. Continue on Ferrous sulfate for now. Will follow up after labs result.

## 2019-07-14 NOTE — Assessment & Plan Note (Signed)
Has worsened over past few months.  Not taking Xanax due to AMS.  Increased Sertraline to 100 mg daily. Support offered today.

## 2019-07-14 NOTE — Assessment & Plan Note (Signed)
Mainly in her back and shoulders.  She takes Tylenol, Meloxicam, and Tramadol as needed. Will monitor.

## 2019-07-14 NOTE — Assessment & Plan Note (Signed)
Currently uncontrolled. Increase Lisinopril to 20 mg PO daily. CBC, CMP drawn today. Reinforced DASH diet and increasing exercise.

## 2019-07-14 NOTE — Progress Notes (Signed)
HPI:  Patient presents to the clinic today for her subsequent Medicare Wellness Examination.  She is also due for a follow up of chronic conditions.  Patient also complains of painful urination intermittently and has noticed some blood after performing hygiene.  DM 2:  Her last A1C was 6.2 on 01/2019.  She is currently not taking any diabetic medications at this time.  She checks her feet regularly.  She does not check her sugars at home.  She gets an eye exam yearly.  GERD:  No issues off meds.  There is no upper GI on file.  Arthritis:  Mainly in her shoulders and lower spine.  She takes Tramadol, Meloxicam, and Tylenol as needed with good relief.  Hx of Breast Cancer:  S/p excision, chemo, and radiation.  She is taking Exemestane as prescribed.  She follows with Oncology.  Iron Deficiency Anemia:  Her last H/H was 13.8/46.1 on 01/2019.  She takes Ferrous Sulfate daily.  She follows with Dr. Burr Medico.  HLD:  Her last LDL was 130 on 06/2018.  She is not taking any cholesterol lowering medications at this time.  She tries to consume a low fat diet.  HTN:  Her BP today is 150/90.  She is taking Lisinopril as prescribed.  ECG from 01/2019 today.  Osteoporosis:  Managed on Fosamax, Vitamin D3.  Bone density from 03/2017 reviewed.  IBS:  Mainly diarrhea.  She takes Lomotil as needed with good relief.    MCI:  She has some expressive aphasia.  She is not currently taking any medications.  She follows with Dr. [name].  Anxiety:  She is currently managed on Sertraline and Xanax.  She reports her anxiety has worsened recently.  She does not take Xanax due to adverse side effects.  OSA:  She no longer wears a CPAP.  She sleeps 7-8 hours per night.  She feels rested when she wakes.  She does take naps during the day.  Recurrent UTI:  No recent UTI.  No longer taking Macrobid prophylaxis.  Dementia:  Maintained on Namenda. Able to live at home with husband.    Past Medical History:  Diagnosis Date   . Allergic rhinitis, seasonal   . Arm bruise    left arm down to hand from fall 01-05-2019  . Bilateral renal cysts    simple (monitored by dr Karsten Ro)  . Chronic cystitis    urologist-- dr Karsten Ro  . GAD (generalized anxiety disorder)   . History of cancer chemotherapy 09-09-2016  to 11-12-2016   breast cancer  . History of external beam radiation therapy 12-24-2016  to 02-03-2017   right breast  . History of recurrent UTIs   . Humerus fracture 01/05/2019   left ,  hx total shoulder arthroplasty   . Hyperlipidemia   . Hypertension   . IBS (irritable bowel syndrome)    mixed  . Lymphedema of right upper extremity   . 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 Memory disorder 06/22/2017   with intermittant confusion;  neurologist-- dr Krista Blue (note in epic)  . Mild persistent asthma    no inhaler  . OA (osteoarthritis)   .  OSA on CPAP   . Type 2 diabetes, diet controlled ([place])   . Urge incontinence of urine   . Wears glasses     Current Outpatient Medications  Medication Sig Dispense Refill  . acetaminophen (TYLENOL) 500 MG tablet Take 1,000 mg by mouth every 6 (six) hours as needed for moderate pain or headache.     . alendronate (FOSAMAX) 70 MG tablet TAKE 1 TABLET EVERY 7 DAYS WITH A FULL GLASS OF WATER ON AN EMPTY STOMACH DO NOT LIE DOWN FOR AT LEAST 30 MIN (Patient taking differently: Take 70 mg by mouth every Tuesday. ) 4 tablet 5  . ALPRAZolam (XANAX) 0.25 MG tablet TAKE ONE TABLET BY MOUTH TWICE DAILY AS NEEDED (Patient taking differently: Take 0.25 mg by mouth daily as needed for anxiety. ) 60 tablet 0  . Calcium  Carbonate-Vitamin D (CALCIUM-VITAMIN D) 500-200 MG-UNIT tablet Take 1 tablet by mouth daily.    . cetirizine (ZYRTEC) 10 MG tablet Take 10 mg by mouth every evening.    . Cholecalciferol (VITAMIN D-3) 125 MCG (5000 UT) TABS Take 5,000 Units by mouth daily.    . diphenoxylate-atropine (LOMOTIL) 2.5-0.025 MG tablet Take 1 tablet by mouth 4 (four) times daily as needed for diarrhea or loose stools. 30 tablet 5  . EPINEPHrine 0.3 mg/0.3 mL IJ SOAJ injection Inject 0.3 mg into the muscle once as needed for anaphylaxis.     Marland Kitchen exemestane (AROMASIN) 25 MG tablet Take 1 tablet (25 mg total) by mouth daily. 30 tablet 6  . ferrous sulfate 325 (65 FE) MG tablet Take 325 mg by mouth daily with breakfast.    . lisinopril (ZESTRIL) 10 MG tablet TAKE 1 TABLET BY MOUTH DAILY 90 tablet 0  . meloxicam (MOBIC) 7.5 MG tablet Take 1 tablet (7.5 mg total) by mouth daily. 5 tablet 0  . memantine (NAMENDA TITRATION PACK) tablet pack Take by mouth See admin instructions. 5 mg/day for =1 week; 5 mg twice daily for =1 week; 15 mg/day given in 5 mg and 10 mg separated doses for =1 week; then 10 mg twice daily Call for refill after titration pack concluded 49 tablet 0  . memantine (NAMENDA) 10 MG tablet TAKE ONE TABLET TWICE DAILY 60 tablet 5  . Methylcellulose, Laxative, (CITRUCEL) 500 MG TABS Take 1,500 mg by mouth 3 (three) times daily.     . nitrofurantoin (MACRODANTIN) 100 MG capsule Take 100 mg by mouth at bedtime.     Marland Kitchen nystatin (MYCOSTATIN/NYSTOP) powder Apply topically 3 (three) times daily. (Patient taking differently: Apply 1 g topically 3 (three) times daily as needed (irritated skin). ) 15 g 0  . Probiotic Product (PROBIOTIC PO) Take 1 capsule by mouth daily.     . sertraline (ZOLOFT) 50 MG tablet TAKE ONE TABLET BY MOUTH EVERY DAY 30 tablet 5  . traMADol (ULTRAM) 50 MG tablet Take 1 tablet (50 mg total) by mouth every 8 (eight) hours as needed. 90 tablet 0   No current facility-administered medications for this  visit.     Allergies  Allergen Reactions  . Other Shortness Of Breath, Swelling and Other (See Comments)    Cats, dogs, mold Induced asthma Cats, dogs, mold    Family History  Problem Relation Age of Onset  . Arthritis Mother   . Stroke Mother   . Hypertension Mother   . Cancer Father        Prostate  . Stroke Father   . Hypertension Father   . Hypertension  Maternal Grandmother   . Rheum arthritis Maternal Grandfather   . Stroke Maternal Grandfather   . Hypertension Maternal Grandfather   . Cancer Paternal Grandmother        Colon  . Hypertension Paternal Grandmother   . Hypertension Paternal Grandfather   . Breast cancer Neg Hx     Social History   Socioeconomic History  . Marital status: Married    Spouse name: Not on file  . Number of children: 2  . Years of education: 2 years college  . Highest education level: Not on file  Occupational History  . Occupation: Retired  Scientific laboratory technician  . Financial resource strain: Not on file  . Food insecurity    Worry: Not on file    Inability: Not on file  . Transportation needs    Medical: Not on file    Non-medical: Not on file  Tobacco Use  . Smoking status: Former Smoker    Packs/day: 2.00    Years: 25.00    Pack years: 50.00    Quit date: 12/23/1991    Years since quitting: 27.5  . Smokeless tobacco: Never Used  Substance and Sexual Activity  . Alcohol use: Not Currently  . Drug use: No  . Sexual activity: Not Currently  Lifestyle  . Physical activity    Days per week: Not on file    Minutes per session: Not on file  . Stress: Not on file  Relationships  . Social Herbalist on phone: Not on file    Gets together: Not on file    Attends religious service: Not on file    Active member of club or organization: Not on file    Attends meetings of clubs or organizations: Not on file    Relationship status: Not on file  . Intimate partner violence    Fear of current or ex partner: Not on file     Emotionally abused: Not on file    Physically abused: Not on file    Forced sexual activity: Not on file  Other Topics Concern  . Not on file  Social History Narrative   Lives at home with her husband.   Right-handed.   Occasional caffeine use.    Hospitiliaztions: 03/2019- Fall  Health Maintenance:    Flu: 09/2018  Tetanus: 03/2019  Pneumovax: 01/2012  Prevnar: 03/2015  Zostavax: 01/2014  Shingrix: never  Mammogram: 08/2018, due 08/2019  Pap Smear: no longer screening  Bone Density: 03/2017  Colon Screening: 2015  Eye Doctor: annually  Dental Exam: annually   Providers:   PCP: [name], NP   Oncologist: Dr. Burr Medico  Neurologist:  Dr. Mardene Celeste  Orthopedics: Dr. Onnie Graham     I have personally reviewed and have noted:  1. The patient's medical and social history 2. Their use of alcohol, tobacco or illicit drugs 3. Their current medications and supplements 4. The patient's functional ability including ADL's, fall risks, home safety risks and hearing or visual impairment. 5. Diet and physical activities 6. Evidence for depression or mood disorder  Subjective:   Review of Systems:   Constitutional: Denies fever, malaise, fatigue, headache or abrupt weight changes.  HEENT: Denies eye pain, eye redness, ear pain, ringing in the ears, wax buildup, runny nose, nasal congestion, bloody nose, or sore throat. Respiratory: Reports SOB with rest and exertion.  Denies difficulty breathing, cough or sputum production.   Cardiovascular: Complains of swelling in bilateral feet for the last week.  Denies chest  pain, chest tightness, palpitations.  Gastrointestinal: Has chronic diarrhea and hemorrhoids.  Denies abdominal pain, bloating, constipation, or blood in the stool.  GU: Complains of intermittent pain with urination.  Denies urgency, frequency,  burning sensation, blood in urine, odor or discharge. Musculoskeletal: History of arthritis- mostly in shoulders and back.  Has had a few  recent falls.  Denies decrease in range of motion, muscle pain or joint pain and swelling.  Skin: Denies redness, rashes, lesions or ulcercations.  Neurological: Daughter reports more difficulty with short-term memory- think it could be associated to UTI.  Denies dizziness, difficulty with speech or problems with balance and coordination.  Psych: Hx of anxiety, worsened recently.  Denies depression, SI/HI.  No other specific complaints in a complete review of systems (except as listed in HPI above).  Objective:  PE:  BP (!) 150/92   Pulse 100   Temp 98.3 F (36.8 C) (Temporal)   Ht 5' (1.524 m)   Wt 236 lb (107 kg)   SpO2 94%   BMI 46.09 kg/m    Wt Readings from Last 3 Encounters:  04/13/19 220 lb (99.8 kg)  01/27/19 220 lb (99.8 kg)  01/05/19 200 lb (90.7 kg)    General: Appears her stated age, obese in NAD. Skin: Warm, dry and intact. No rashes, lesions or ulcerations noted. HEENT: Head: normal shape and size; Eyes: sclera white, no icterus, conjunctiva pink, PERRLA and EOMs intact; Ears: Tm's gray and intact, normal light reflex; Neck: Neck supple, trachea midline. No masses, lumps or thyromegaly present.  Cardiovascular: Normal rate and rhythm. S1,S2 noted.  No murmur, rubs or gallops noted. No JVD or BLE edema. No carotid bruits noted. Pulmonary/Chest: Tachypnea, dyspnea with exertion, diminished bases.  Increased effort and positive vesicular breath sounds.No wheezes, rales or ronchi noted.  Abdomen: Soft and nontender. Normal bowel sounds. No distention or masses noted. Liver, spleen and kidneys non palpable. Musculoskeletal: Normal range of motion. Strength 5/5 BUE/BLE. No signs of joint swelling.  Neurological: Alert and oriented. Cranial nerves II-XII grossly intact. Coordination normal.  Psychiatric: Mood and affect normal. Behavior is normal. Decreased safety awareness.    BMET    Component Value Date/Time   NA 142 01/26/2019 1000   NA 140 09/22/2017 1003   K  4.9 01/26/2019 1000   K 3.8 09/22/2017 1003   CL 104 01/26/2019 1000   CO2 30 01/26/2019 1000   CO2 29 09/22/2017 1003   GLUCOSE 161 (H) 01/26/2019 1000   GLUCOSE 116 09/22/2017 1003   BUN 24 (H) 01/26/2019 1000   BUN 22.4 09/22/2017 1003   CREATININE 0.64 01/26/2019 1000   CREATININE 0.71 07/02/2018 1514   CREATININE 0.8 09/22/2017 1003   CALCIUM 10.3 01/26/2019 1000   CALCIUM 10.0 09/22/2017 1003   GFRNONAA >60 01/26/2019 1000   GFRAA >60 01/26/2019 1000    Lipid Panel     Component Value Date/Time   CHOL 208 (H) 07/02/2018 1514   TRIG 176 (H) 07/02/2018 1514   HDL 47 (L) 07/02/2018 1514   CHOLHDL 4.4 07/02/2018 1514   VLDL 18.8 03/05/2017 1058   LDLCALC 130 (H) 07/02/2018 1514    CBC    Component Value Date/Time   WBC 7.1 01/26/2019 1000   RBC 4.61 01/26/2019 1000   HGB 13.8 01/26/2019 1000   HGB 10.3 (L) 09/22/2017 1003   HCT 46.1 (H) 01/26/2019 1000   HCT 31.9 (L) 09/22/2017 1003   PLT 287 01/26/2019 1000   PLT 350 09/22/2017 1003  MCV 100.0 01/26/2019 1000   MCV 80.6 09/22/2017 1003   MCH 29.9 01/26/2019 1000   MCHC 29.9 (L) 01/26/2019 1000   RDW 13.2 01/26/2019 1000   RDW 20.1 (H) 09/22/2017 1003   LYMPHSABS 1.5 07/22/2018 1248   LYMPHSABS 1.5 09/22/2017 1003   MONOABS 0.4 07/22/2018 1248   MONOABS 0.4 09/22/2017 1003   EOSABS 0.1 07/22/2018 1248   EOSABS 0.1 09/22/2017 1003   BASOSABS 0.0 07/22/2018 1248   BASOSABS 0.0 09/22/2017 1003    Hgb A1C Lab Results  Component Value Date   HGBA1C 6.2 (H) 01/26/2019      Assessment and Plan:   Medicare Annual Wellness Visit:  Diet: She does eat meat. She consumes fruits and veggies daily. She does not eat a lot of fried foods. She drinks mostly cranberry juice, water, some soda. Physical activity: Sedentary Depression/mood screen: Positive, chronic on meds Hearing: Intact to whispered voice Visual acuity: Grossly normal, performs annual eye exam  ADLs: Capable Fall risk: High Home safety:  Good Cognitive evaluation: Trouble with recall. Intact to orientation, naming, and repetition EOL planning: Living Will, DNR/ I agree  Preventative Medicine:  Flu, Pneumonia, Tetanus, Zostavax UTD.  She is due for her mammogram in September 2020.  Colon screening and pap smears are no longer being screened.  Continue to see eye doctor and dentist at least annually.  Screening due dates provided to patient with AVS papers.  Dysuria:  Urinalysis:1+ leuks, 1+ blood, 1+ ketones, protein. Will send urine for culture. Macrobid 100 mg Po BID x 10 days sent to pharmacy. Ok to use AZO OTC for symptom relief. Encouraged fluids.  Next appointment:  Return in 6 months for follow up of chronic conditions, or sooner if needed.  [name], NP

## 2019-07-14 NOTE — Assessment & Plan Note (Signed)
Currently managed without medications. A1C, CMP drawn today. Will update after results complete. Reinforced low carb diet and increasing exercise.

## 2019-07-14 NOTE — Assessment & Plan Note (Signed)
Not currently an issue. Will monitor. 

## 2019-07-14 NOTE — Assessment & Plan Note (Signed)
Currently managed on Namenda. Daughter reports increase in short-term memory issues.  Feels it is exacerbated by potential UTI. Will monitor.

## 2019-07-14 NOTE — Assessment & Plan Note (Addendum)
Complaining of urinary symptoms today. Urinalysis and culture today.  Suspect UTI- Rx for Macrobid sent today. Will update as labs result.

## 2019-07-14 NOTE — Assessment & Plan Note (Signed)
Managed on Fosamax and OTC Vitamin D3. Vitamin D level checked today. Bone density from 03/2017 reviewed.

## 2019-07-14 NOTE — Assessment & Plan Note (Signed)
Continue to follow with Dr. Burr Medico.

## 2019-07-14 NOTE — Assessment & Plan Note (Signed)
Mostly diarrhea. Monitor.

## 2019-07-14 NOTE — Assessment & Plan Note (Signed)
Not currently an issue. CBC, CMP today. Will monitor.

## 2019-07-14 NOTE — Patient Instructions (Signed)
Health Maintenance After Age 76 After age 76, you are at a higher risk for certain long-term diseases and infections as well as injuries from falls. Falls are a major cause of broken bones and head injuries in people who are older than age 76. Getting regular preventive care can help to keep you healthy and well. Preventive care includes getting regular testing and making lifestyle changes as recommended by your health care provider. Talk with your health care provider about:  Which screenings and tests you should have. A screening is a test that checks for a disease when you have no symptoms.  A diet and exercise plan that is right for you. What should I know about screenings and tests to prevent falls? Screening and testing are the best ways to find a health problem early. Early diagnosis and treatment give you the best chance of managing medical conditions that are common after age 76. Certain conditions and lifestyle choices may make you more likely to have a fall. Your health care provider may recommend:  Regular vision checks. Poor vision and conditions such as cataracts can make you more likely to have a fall. If you wear glasses, make sure to get your prescription updated if your vision changes.  Medicine review. Work with your health care provider to regularly review all of the medicines you are taking, including over-the-counter medicines. Ask your health care provider about any side effects that may make you more likely to have a fall. Tell your health care provider if any medicines that you take make you feel dizzy or sleepy.  Osteoporosis screening. Osteoporosis is a condition that causes the bones to get weaker. This can make the bones weak and cause them to break more easily.  Blood pressure screening. Blood pressure changes and medicines to control blood pressure can make you feel dizzy.  Strength and balance checks. Your health care provider may recommend certain tests to check your  strength and balance while standing, walking, or changing positions.  Foot health exam. Foot pain and numbness, as well as not wearing proper footwear, can make you more likely to have a fall.  Depression screening. You may be more likely to have a fall if you have a fear of falling, feel emotionally low, or feel unable to do activities that you used to do.  Alcohol use screening. Using too much alcohol can affect your balance and may make you more likely to have a fall. What actions can I take to lower my risk of falls? General instructions  Talk with your health care provider about your risks for falling. Tell your health care provider if: ? You fall. Be sure to tell your health care provider about all falls, even ones that seem minor. ? You feel dizzy, sleepy, or off-balance.  Take over-the-counter and prescription medicines only as told by your health care provider. These include any supplements.  Eat a healthy diet and maintain a healthy weight. A healthy diet includes low-fat dairy products, low-fat (lean) meats, and fiber from whole grains, beans, and lots of fruits and vegetables. Home safety  Remove any tripping hazards, such as rugs, cords, and clutter.  Install safety equipment such as grab bars in bathrooms and safety rails on stairs.  Keep rooms and walkways well-lit. Activity   Follow a regular exercise program to stay fit. This will help you maintain your balance. Ask your health care provider what types of exercise are appropriate for you.  If you need a cane or   walker, use it as recommended by your health care provider.  Wear supportive shoes that have nonskid soles. Lifestyle  Do not drink alcohol if your health care provider tells you not to drink.  If you drink alcohol, limit how much you have: ? 0-1 drink a day for women. ? 0-2 drinks a day for men.  Be aware of how much alcohol is in your drink. In the [place] one drink equals one typical bottle of beer (12  oz), one-half glass of wine (5 oz), or one shot of hard liquor (1 oz).  Do not use any products that contain nicotine or tobacco, such as cigarettes and e-cigarettes. If you need help quitting, ask your health care provider. Summary  Having a healthy lifestyle and getting preventive care can help to protect your health and wellness after age 76.  Screening and testing are the best way to find a health problem early and help you avoid having a fall. Early diagnosis and treatment give you the best chance for managing medical conditions that are more common for people who are older than age 76.  Falls are a major cause of broken bones and head injuries in people who are older than age 76. Take precautions to prevent a fall at home.  Work with your health care provider to learn what changes you can make to improve your health and wellness and to prevent falls. This information is not intended to replace advice given to you by your health care provider. Make sure you discuss any questions you have with your health care provider. Document Released: 10/21/2017 Document Revised: 03/31/2019 Document Reviewed: 10/21/2017 Elsevier Patient Education  2020 [employer]  

## 2019-07-14 NOTE — Assessment & Plan Note (Signed)
Not currently taking medications for this. CMP, Lipids checked today. Encouraged low fat diet and increasing exercise for weight loss. Will follow up after labs result.

## 2019-07-14 NOTE — Assessment & Plan Note (Signed)
Stable Will monitor 

## 2019-07-15 LAB — COMPREHENSIVE METABOLIC PANEL
ALT: 14 U/L (ref 0–35)
AST: 25 U/L (ref 0–37)
Albumin: 3.8 g/dL (ref 3.5–5.2)
Alkaline Phosphatase: 104 U/L (ref 39–117)
BUN: 19 mg/dL (ref 6–23)
CO2: 31 mEq/L (ref 19–32)
Calcium: 10.1 mg/dL (ref 8.4–10.5)
Chloride: 102 mEq/L (ref 96–112)
Creatinine, Ser: 0.83 mg/dL (ref 0.40–1.20)
GFR: 66.9 mL/min (ref >60.00)
Glucose, Bld: 184 mg/dL — ABNORMAL HIGH (ref 70–99)
Potassium: 4.8 mEq/L (ref 3.5–5.1)
Sodium: 141 mEq/L (ref 135–145)
Total Bilirubin: 0.5 mg/dL (ref 0.2–1.2)
Total Protein: 7.7 g/dL (ref 6.0–8.3)

## 2019-07-15 LAB — URINE CULTURE
MICRO NUMBER:: 697216
Result:: NO GROWTH
SPECIMEN QUALITY:: ADEQUATE

## 2019-07-15 LAB — CBC
HCT: 44.3 % (ref 36.0–46.0)
Hemoglobin: 14.5 g/dL (ref 12.0–15.0)
MCHC: 32.8 g/dL (ref 30.0–36.0)
MCV: 91.7 fl (ref 78.0–100.0)
Platelets: 251 10*3/uL (ref 150.0–400.0)
RBC: 4.83 Mil/uL (ref 3.87–5.11)
RDW: 14.9 % (ref 11.5–15.5)
WBC: 8.7 10*3/uL (ref 4.0–10.5)

## 2019-07-15 LAB — LIPID PANEL
Cholesterol: 232 mg/dL — ABNORMAL HIGH (ref 0–200)
HDL: 35 mg/dL — ABNORMAL LOW (ref >39.00)
NonHDL: 196.77
Total CHOL/HDL Ratio: 7
Triglycerides: 271 mg/dL — ABNORMAL HIGH (ref 0.0–149.0)
VLDL: 54.2 mg/dL — ABNORMAL HIGH (ref 0.0–40.0)

## 2019-07-15 LAB — VITAMIN D 25 HYDROXY (VIT D DEFICIENCY, FRACTURES): VITD: 37.64 ng/mL (ref 30.00–100.00)

## 2019-07-15 LAB — HEMOGLOBIN A1C: Hgb A1c MFr Bld: 8.1 % — ABNORMAL HIGH (ref 4.6–6.5)

## 2019-07-15 LAB — LDL CHOLESTEROL, DIRECT: Direct LDL: 175 mg/dL

## 2019-07-18 NOTE — Progress Notes (Signed)
[place]   [phone] Fax:([phone]) [phone]   Clinic Follow up Note   Patient Care Team: [name], NP as PCP - General (Internal Medicine)  Date of Service:  07/20/2019  CHIEF COMPLAINT: F/u on right breast cancer  SUMMARY OF ONCOLOGIC HISTORY: Oncology History Overview Note  Breast cancer of lower-inner quadrant of right female breast [facility])   Staging form: Breast, AJCC 7th Edition   - Clinical stage from 06/09/2016: Stage IIB (T2, N1, M0) - Signed by Truitt Merle, MD on 06/27/2016   - Pathologic stage from 07/29/2016: Stage IIA (T1c, N1a, cM0) - Signed by Truitt Merle, MD on 08/13/2016     Breast cancer of lower-inner quadrant of right female breast ([place])  05/23/2016 Mammogram   Diagnostic mammogram and ultrasound showed suspicious architectural distortion within the right breast lower inner quadrant, measuring 1.3 cm, without sonographic corelate.   06/03/2016 Initial Biopsy   Right breast inferior lower quadrant core needle biopsy showed atypical ductal hyperplasia with calcifications   06/09/2016 Receptors her2   Breast biopsy showed ER 100% positive, PR 100% positive, HER-2 negative, Ki-67 40%   06/09/2016 Initial Diagnosis   Breast cancer of lower-inner quadrant of right female breast ([place])   06/09/2016 Initial Biopsy   Right breast inner quadrant core needle biopsy showed invasive ductal carcinoma and DCIS, grade 1-2   06/17/2016 Initial Biopsy   Right axillary lymph node core needle biopsy showed metastatic carcinoma   06/17/2016 Receptors her2   Axillary node biopsy showed ER 100% positive, PR 95% positive, HER-2 negative   06/19/2016 Imaging   Bilateral breast MRI showed locations in the lower inner right breast, largest 2.7X1.3X1.6cm, biopsy clips in 2 of this areas. There are abnormal right axillary lymph nodes showing second cortical's, no evidence of malignancy in the left breast.   07/29/2016 Surgery   Right lumpectomy and ALND   07/29/2016  Pathology Results   Right lumpectomy showed G3 IDC, DCIS, margins (-), LVI(-).  1 of 16 nodes was positive    07/29/2016 Miscellaneous   Mammaprint showed high risk disease, luminal type B   09/09/2016 - 11/12/2016 Adjuvant Chemotherapy   Docetaxel and Cytoxan (TC) every 3 weeks   12/24/2016 - 02/03/2017 Radiation Therapy   Adjuvant breast radiation 12/24/16 - 02/03/17 : Right Breast treated to 50.4 Gy in 28 fractions. Right Axilla treated to 45 Gy in 25 fractions.   02/17/2017 -  Anti-estrogen oral therapy   Letrozole 2.5 mg daily    03/23/2017 Imaging   DG Bone Density 03/23/17 ASSESSMENT: The BMD measured at Femur Neck Right is 0.809 g/cm2 with a T-score of -1.6. This patient is considered osteopenic according to [facility]) criteria.   09/08/2017 Mammogram   Diagnostic Mammogram 09/08/17  IMPRESSION: No mammographic evidence of malignancy in either breast, status post right lumpectomy. RECOMMENDATION: Diagnostic mammogram is suggested in 1 year. (Code:[id number]-B-01Y)   09/09/2018 Mammogram   09/10/2019 Mammogram IMPRESSION: No mammographic evidence of malignancy.      CURRENT THERAPY:  Exemestanestarted on10/16/18  INTERVAL HISTORY:  Tiffany Arnold is here for a follow up right breast cancer. She was last seen by me 6 months ago. She presents to the clinic with Nurse Tech. Her daughter Tiffany Arnold was called to be included in the visit today. She notes she fell out of the bed twice since last visit. She required stitches with latest fall. She now has bed with rails to help protect her. She ambulated with walker/roller. She lives  with her husband and her daughter Tiffany Arnold. She notes she has not been active given concern of falls per her and lack of motivation per her daughter. She is trying to take on small projects at home. She is taking Zoloft with dose increase to help her depression. She had been having panic and anxiety attacks. She feels she is having trouble finding  the words she wants to say.  She is tolerating Exemestane well.    REVIEW OF SYSTEMS:   Constitutional: Denies fevers, chills or abnormal weight loss Eyes: Denies blurriness of vision Ears, nose, mouth, throat, and face: Denies mucositis or sore throat Respiratory: Denies cough, dyspnea or wheezes Cardiovascular: Denies palpitation, chest discomfort or lower extremity swelling Gastrointestinal:  Denies nausea, heartburn or change in bowel habits Skin: Denies abnormal skin rashes Lymphatics: Denies new lymphadenopathy or easy bruising Neurological:Denies numbness, tingling or new weaknesses Behavioral/Psych: Mood is stable, no new changes  All other systems were reviewed with the patient and are negative.  MEDICAL HISTORY:  Past Medical History:  Diagnosis Date   Allergic rhinitis, seasonal    Arm bruise    left arm down to hand from fall 01-05-2019   Bilateral renal cysts    simple (monitored by dr Karsten Ro)   Chronic cystitis    urologist-- dr Karsten Ro   GAD (generalized anxiety disorder)    History of cancer chemotherapy 09-09-2016  to 11-12-2016   breast cancer   History of external beam radiation therapy 12-24-2016  to 02-03-2017   right breast   History of recurrent UTIs    Humerus fracture 01/05/2019   left ,  hx total shoulder arthroplasty    Hyperlipidemia    Hypertension    IBS (irritable bowel syndrome)    mixed   Lymphedema of right upper extremity    Malignant neoplasm of lower-inner quadrant of right breast of female, estrogen receptor positive [facility])    ONCOLOGIST-- dr Burr Medico;   dx 06/ 2017,  Stage IIB (pT1c N1 cM0), Grade 1-2,  DCIS,  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/  07-29-2016  s/p  right lumpectomy w/ sln dissections (1 out of 16 positve),   completed chemo 11-12-2016,  completed radiation 02-03-2017,  started anti-estrogen therapy 02-17-2017   Memory disorder 06/22/2017   with intermittant confusion;  neurologist-- dr Krista Blue (note in epic)   Mild  persistent asthma    no inhaler   OA (osteoarthritis)    OSA on CPAP    Type 2 diabetes, diet controlled ([place])    Urge incontinence of urine    Wears glasses     SURGICAL HISTORY: Past Surgical History:  Procedure Laterality Date   BREAST CYST EXCISION Right 2001   negative   BREAST LUMPECTOMY WITH NEEDLE LOCALIZATION AND AXILLARY LYMPH NODE DISSECTION Right 07/29/2016   Procedure: RIGHT BREAST LUMPECTOMY WITH DOUBLE NEEDLE LOCALIZATION AND COMPLETE RIGHT AXILLARY LYMPH NODE DISSECTION;  Surgeon: Fanny Skates, MD;  Location: [place];  Service: General;  Laterality: Right;   BREAST SURGERY Left 2000   Biopsy   BUNIONECTOMY Bilateral 1998   great toe fusion on right foot   CATARACT EXTRACTION W/ INTRAOCULAR LENS  IMPLANT, BILATERAL  right,  fall 2019;  left 2017   COLONOSCOPY W/ POLYPECTOMY     LUMBAR SPINE SURGERY  1970s   NASAL SINUS SURGERY  2015   ORIF HUMERUS FRACTURE Left 01/27/2019   Procedure: OPEN REDUCTION INTERNAL FIXATION (ORIF) LEFT HUMERUS;  Surgeon: Justice Britain, MD;  Location: [facility];  Service: Orthopedics;  Laterality: Left;  PORTACATH PLACEMENT N/A 09/05/2016   Procedure: INSERTION PORT-A-CATH;  Surgeon: Fanny Skates, MD;  Location: [facility];  Service: General;  Laterality: N/A;   TOTAL KNEE ARTHROPLASTY Bilateral 2007   TOTAL SHOULDER REPLACEMENT Left 1017   UMBILICAL HERNIA REPAIR  04-06-2014   _0     I have reviewed the social history and family history with the patient and they are unchanged from previous note.  ALLERGIES:  is allergic to other.  MEDICATIONS:  Current Outpatient Medications  Medication Sig Dispense Refill   acetaminophen (TYLENOL) 500 MG tablet Take 1,000 mg by mouth every 6 (six) hours as needed for moderate pain or headache.      alendronate (FOSAMAX) 70 MG tablet TAKE 1 TABLET EVERY 7 DAYS WITH A FULL GLASS OF WATER ON AN EMPTY STOMACH DO NOT LIE DOWN FOR AT LEAST 30 MIN (Patient taking differently: Take 70 mg by  mouth every Tuesday. ) 4 tablet 5   ALPRAZolam (XANAX) 0.25 MG tablet TAKE ONE TABLET BY MOUTH TWICE DAILY AS NEEDED (Patient taking differently: Take 0.25 mg by mouth daily as needed for anxiety. ) 60 tablet 0   Calcium Polycarbophil (FIBER-CAPS PO) Take by mouth.     cetirizine (ZYRTEC) 10 MG tablet Take 10 mg by mouth as needed.      Cholecalciferol (VITAMIN D-3) 125 MCG (5000 UT) TABS Take 5,000 Units by mouth daily.     EPINEPHrine 0.3 mg/0.3 mL IJ SOAJ injection Inject 0.3 mg into the muscle once as needed for anaphylaxis.      exemestane (AROMASIN) 25 MG tablet Take 1 tablet (25 mg total) by mouth daily. 30 tablet 6   ferrous sulfate 325 (65 FE) MG tablet Take 325 mg by mouth daily with breakfast.     lisinopril (ZESTRIL) 20 MG tablet Take 1 tablet (20 mg total) by mouth daily. 90 tablet 3   memantine (NAMENDA) 10 MG tablet TAKE ONE TABLET TWICE DAILY 60 tablet 5   Multiple Vitamins-Minerals (EYE VITAMINS PO) Take by mouth.     nitrofurantoin, macrocrystal-monohydrate, (MACROBID) 100 MG capsule Take 1 capsule (100 mg total) by mouth 2 (two) times daily. 10 capsule 0   Probiotic Product (PROBIOTIC PO) Take 1 capsule by mouth daily.      sertraline (ZOLOFT) 100 MG tablet Take 1 tablet (100 mg total) by mouth daily. 90 tablet 3   traMADol (ULTRAM) 50 MG tablet Take 1 tablet (50 mg total) by mouth every 8 (eight) hours as needed. 90 tablet 0   glipiZIDE (GLUCOTROL) 5 MG tablet Take 1 tablet (5 mg total) by mouth daily before breakfast. 30 tablet 2   rosuvastatin (CRESTOR) 10 MG tablet Take 1 tablet (10 mg total) by mouth daily. 30 tablet 2   No current facility-administered medications for this visit.     PHYSICAL EXAMINATION: ECOG PERFORMANCE STATUS: 2 - Symptomatic, <50% confined to bed  Vitals:   07/20/19 1116  BP: (!) 154/98  Pulse: 85  Resp: 20  Temp: 98.6 F (37 C)  SpO2: 93%   Filed Weights   07/20/19 1116  Weight: 235 lb 4.8 oz (106.7 kg)     GENERAL:alert, no distress and comfortable SKIN: skin color, texture, turgor are normal, no rashes or significant lesions EYES: normal, Conjunctiva are pink and non-injected, sclera clear  NECK: supple, thyroid normal size, non-tender, without nodularity LYMPH:  no palpable lymphadenopathy in the cervical, axillary  LUNGS: clear to auscultation and percussion with normal breathing effort HEART: regular rate & rhythm and no  murmurs (+) Very mild b/l lower extremity edema ABDOMEN:abdomen soft, non-tender and normal bowel sounds Musculoskeletal:no cyanosis of digits and no clubbing  NEURO: alert & oriented x 3 with fluent speech, no focal motor/sensory deficits BREAST: (+) S/p right lumpectomy: Surgical incisions healed well (+) Skin hyperpigmentation with erythema and swelling of right breast from RT. No palpable mass, nodules or adenopathy bilaterally. Breast exam benign.  Exam performed in wheelchair today   LABORATORY DATA:  I have reviewed the data as listed CBC Latest Ref Rng & Units 07/20/2019 07/14/2019 01/26/2019  WBC 4.0 - 10.5 K/uL 8.2 8.7 7.1  Hemoglobin 12.0 - 15.0 g/dL 13.9 14.5 13.8  Hematocrit 36.0 - 46.0 % 45.0 44.3 46.1(H)  Platelets 150 - 400 K/uL 182 251.0 287     CMP Latest Ref Rng & Units 07/20/2019 07/14/2019 01/26/2019  Glucose 70 - 99 mg/dL 174(H) 184(H) 161(H)  BUN 8 - 23 mg/dL 21 19 24(H)  Creatinine 0.44 - 1.00 mg/dL 0.82 0.83 0.64  Sodium 135 - 145 mmol/L 143 141 142  Potassium 3.5 - 5.1 mmol/L 5.5(H) 4.8 4.9  Chloride 98 - 111 mmol/L 105 102 104  CO2 22 - 32 mmol/L _0 Calcium 8.9 - 10.3 mg/dL 9.9 10.1 10.3  Total Protein 6.5 - 8.1 g/dL 7.3 7.7 -  Total Bilirubin 0.3 - 1.2 mg/dL 0.4 0.5 -  Alkaline Phos 38 - 126 U/L 93 104 -  AST 15 - 41 U/L 18 25 -  ALT 0 - 44 U/L 17 14 -      RADIOGRAPHIC STUDIES: I have personally reviewed the radiological images as listed and agreed with the findings in the report. No results found.   ASSESSMENT & PLAN:   MASHONDA BROSKI is a 78 y.o. female with   1. 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 Treated with right breast lumpectomy, adjuvant chemo, radiation, and antiestrogen therapy. She was initially put on Letrozole in 01/2017, but switched to exemestane in 09/2017. Tolerating well, plan to complete 5 years.  -She has been more depressed mood and more speech issues lately. She did have 2 falls out of the bed in the past 6 months, one of which required stitches. She now had guard rails on her bed.  -From a breast cancer standpoint she is clinically doing well. Lab reviewed, her CBC and CMP are within normal limits except K5.5 and BG 174. Her physical exam and her 08/2018 mammogram were unremarkable except breast changes from RT. There is no clinical concern for recurrence. -Continue surveillance. Next mammogram in 08/2019.  -Continue Exemestane  -F/u in 6 months, may turn into virtual visit if patient requests.   2. Memory loss/cognitive dysfunction and intermittent confusion, Depression   -She will continue to f/u with neurologist Dr. [name]. Plan to see them in 07/2019  -She continues to have trouble accessing the words she wants to say. He encouraged her to practice talking more and doing puzzles and reading books.  -Her PCP increased her Zoloft with recent increase in panic attacks and BP. She has not been motivated to be active at home.  -I offered her the chance to speak with SW or counselor. She declined for now.   3. Right upper extremity lymphedema, secondary to surgery -stable, overall improved as seen on exam today   4. HTN and arthritis  -f/u with PCP and continue medications  5. Morbid obesity   6. Bone health  -We discussed how the AI could effect her bones.  -Bone scan 03/23/17. Femur Neck Right T-score -1.6; high risk  osteopenic. -Bisphosphonate was previously discussed. She does not have any dental problems. They are interested but concerned about price.  -Currently on vitamin D. Calcium was previously high, so calcium supplements were held. -Plan for next DEXA in 2021.   7. Recurrent falls  -Her prior left humerus fracture was resolved with open reduction internal fixation of left humerus on 01/27/19. -She had 2 falls out of the bed in the last 2 months, the last time requires stitches. She now has guard rails on her bed.  -She ambulates with walkers. I encouraged her to continue.    Plan  -She is clinically doing well  -Lab and f/u in 6 months  -Continue exemestane  -mammogram in Sep  -I spoke with her daughter during her visit    No problem-specific Assessment & Plan notes found for this encounter.   Orders Placed This Encounter  Procedures   MM DIAG BREAST TOMO BILATERAL    Standing Status:   Future    Standing Expiration Date:   07/19/2020    Order Specific Question:   Reason for Exam (SYMPTOM  OR DIAGNOSIS REQUIRED)    Answer:   screening    Order Specific Question:   Preferred imaging location?    Answer:   [facility]   All questions were answered. The patient knows to call the clinic with any problems, questions or concerns. No barriers to learning was detected. I spent 20 minutes counseling the patient face to face. The total time spent in the appointment was 25 minutes and more than 50% was on counseling and review of test results     Truitt Merle, MD 07/20/2019   I, Joslyn Devon, am acting as scribe for Truitt Merle, MD.   I have reviewed the above documentation for accuracy and completeness, and I agree with the above.

## 2019-07-20 ENCOUNTER — Telehealth: Payer: Self-pay | Admitting: Hematology

## 2019-07-20 ENCOUNTER — Other Ambulatory Visit: Payer: Self-pay

## 2019-07-20 ENCOUNTER — Inpatient Hospital Stay: Payer: Medicare PPO | Attending: Hematology

## 2019-07-20 ENCOUNTER — Inpatient Hospital Stay: Payer: Medicare PPO | Admitting: Hematology

## 2019-07-20 ENCOUNTER — Encounter: Payer: Self-pay | Admitting: Hematology

## 2019-07-20 VITALS — BP 154/98 | HR 85 | Temp 98.6°F | Resp 20 | Ht 60.0 in | Wt 235.3 lb

## 2019-07-20 DIAGNOSIS — Z79899 Other long term (current) drug therapy: Secondary | ICD-10-CM | POA: Insufficient documentation

## 2019-07-20 DIAGNOSIS — R410 Disorientation, unspecified: Secondary | ICD-10-CM

## 2019-07-20 DIAGNOSIS — E119 Type 2 diabetes mellitus without complications: Secondary | ICD-10-CM | POA: Diagnosis not present

## 2019-07-20 DIAGNOSIS — Z79811 Long term (current) use of aromatase inhibitors: Secondary | ICD-10-CM

## 2019-07-20 DIAGNOSIS — R413 Other amnesia: Secondary | ICD-10-CM

## 2019-07-20 DIAGNOSIS — R296 Repeated falls: Secondary | ICD-10-CM

## 2019-07-20 DIAGNOSIS — M1990 Unspecified osteoarthritis, unspecified site: Secondary | ICD-10-CM

## 2019-07-20 DIAGNOSIS — Z7984 Long term (current) use of oral hypoglycemic drugs: Secondary | ICD-10-CM

## 2019-07-20 DIAGNOSIS — I890 Lymphedema, not elsewhere classified: Secondary | ICD-10-CM | POA: Insufficient documentation

## 2019-07-20 DIAGNOSIS — I10 Essential (primary) hypertension: Secondary | ICD-10-CM

## 2019-07-20 DIAGNOSIS — E785 Hyperlipidemia, unspecified: Secondary | ICD-10-CM | POA: Diagnosis not present

## 2019-07-20 DIAGNOSIS — F410 Panic disorder [episodic paroxysmal anxiety] without agoraphobia: Secondary | ICD-10-CM | POA: Insufficient documentation

## 2019-07-20 DIAGNOSIS — F329 Major depressive disorder, single episode, unspecified: Secondary | ICD-10-CM

## 2019-07-20 DIAGNOSIS — M8580 Other specified disorders of bone density and structure, unspecified site: Secondary | ICD-10-CM | POA: Insufficient documentation

## 2019-07-20 DIAGNOSIS — M85861 Other specified disorders of bone density and structure, right lower leg: Secondary | ICD-10-CM

## 2019-07-20 DIAGNOSIS — C50311 Malignant neoplasm of lower-inner quadrant of right female breast: Secondary | ICD-10-CM | POA: Insufficient documentation

## 2019-07-20 DIAGNOSIS — Z170 Estrogen receptor positive status [ER+]: Secondary | ICD-10-CM | POA: Diagnosis not present

## 2019-07-20 DIAGNOSIS — G4733 Obstructive sleep apnea (adult) (pediatric): Secondary | ICD-10-CM

## 2019-07-20 LAB — CBC WITH DIFFERENTIAL/PLATELET
Abs Immature Granulocytes: 0.01 10*3/uL (ref 0.00–0.07)
Basophils Absolute: 0 10*3/uL (ref 0.0–0.1)
Basophils Relative: 1 %
Eosinophils Absolute: 0.1 10*3/uL (ref 0.0–0.5)
Eosinophils Relative: 2 %
HCT: 45 % (ref 36.0–46.0)
Hemoglobin: 13.9 g/dL (ref 12.0–15.0)
Immature Granulocytes: 0 %
Lymphocytes Relative: 18 %
Lymphs Abs: 1.5 10*3/uL (ref 0.7–4.0)
MCH: 29 pg (ref 26.0–34.0)
MCHC: 30.9 g/dL (ref 30.0–36.0)
MCV: 93.9 fL (ref 80.0–100.0)
Monocytes Absolute: 0.5 10*3/uL (ref 0.1–1.0)
Monocytes Relative: 6 %
Neutro Abs: 6 10*3/uL (ref 1.7–7.7)
Neutrophils Relative %: 73 %
Platelets: 182 10*3/uL (ref 150–400)
RBC: 4.79 MIL/uL (ref 3.87–5.11)
RDW: 14.2 % (ref 11.5–15.5)
WBC: 8.2 10*3/uL (ref 4.0–10.5)
nRBC: 0 % (ref 0.0–0.2)

## 2019-07-20 LAB — COMPREHENSIVE METABOLIC PANEL
ALT: 17 U/L (ref 0–44)
AST: 18 U/L (ref 15–41)
Albumin: 3.3 g/dL — ABNORMAL LOW (ref 3.5–5.0)
Alkaline Phosphatase: 93 U/L (ref 38–126)
Anion gap: 7 (ref 5–15)
BUN: 21 mg/dL (ref 8–23)
CO2: 31 mmol/L (ref 22–32)
Calcium: 9.9 mg/dL (ref 8.9–10.3)
Chloride: 105 mmol/L (ref 98–111)
Creatinine, Ser: 0.82 mg/dL (ref 0.44–1.00)
GFR calc Af Amer: >60 mL/min (ref >60)
GFR calc non Af Amer: >60 mL/min (ref >60)
Glucose, Bld: 174 mg/dL — ABNORMAL HIGH (ref 70–99)
Potassium: 5.5 mmol/L — ABNORMAL HIGH (ref 3.5–5.1)
Sodium: 143 mmol/L (ref 135–145)
Total Bilirubin: 0.4 mg/dL (ref 0.3–1.2)
Total Protein: 7.3 g/dL (ref 6.5–8.1)

## 2019-07-20 MED ORDER — ROSUVASTATIN CALCIUM 10 MG PO TABS: 10.0000 mg | ORAL_TABLET | Freq: Every day | ORAL | 2 refills | Status: DC

## 2019-07-20 MED ORDER — GLIPIZIDE 5 MG PO TABS: 5.0000 mg | ORAL_TABLET | Freq: Every day | ORAL | 2 refills | Status: DC

## 2019-07-20 NOTE — Addendum Note (Signed)
Addended by: Lurlean Nanny on: 07/20/2019 11:47 AM   Modules accepted: Orders

## 2019-07-20 NOTE — Telephone Encounter (Signed)
Scheduled appt per 7/29 los. ° °Printed and mailed appt calendar. °

## 2019-07-21 DIAGNOSIS — S42202A Unspecified fracture of upper end of left humerus, initial encounter for closed fracture: Secondary | ICD-10-CM | POA: Diagnosis not present

## 2019-07-21 DIAGNOSIS — Z4789 Encounter for other orthopedic aftercare: Secondary | ICD-10-CM | POA: Diagnosis not present

## 2019-07-21 LAB — VITAMIN D 25 HYDROXY (VIT D DEFICIENCY, FRACTURES): Vit D, 25-Hydroxy: 37.1 ng/mL (ref 30.0–100.0)

## 2019-07-25 NOTE — Progress Notes (Signed)
PATIENT: Tiffany Arnold DOB: 02-28-1941  REASON FOR VISIT: follow up HISTORY FROM: patient  HISTORY OF PRESENT ILLNESS: Today 07/26/19  Tiffany Arnold is a 78 year old female with history of memory disturbance.  Her last memory score was 19/30.  She was started on Namenda at last visit, Aricept was held due to report of diarrhea.  She is on a daily antibiotic to prevent bladder infections.  She has history of breast cancer and was seen by oncology a few days ago and has been doing well.  Her Zoloft was increased recently as result of panic attacks.  She has had recurrent falls since last visit.  In February 2020 she had open reduction internal fixation of her left humerus.  She had a recent fall out of bed requiring stitches.  She now has bed rails her bed at home.  She ambulates with a walker.  She lives with her husband who has memory troubles as well.  She is accompanied by her daughter [name].  At the home she is able to perform her own ADLs.  She does not drive.  She requires assistance with housework and laundry.  She does some light cooking.  She continues to have difficulty with word finding and dressing herself.  Her memory difficulties are exacerbated by low blood sugar and urinary tract infections.  As of this week she will be starting a new diabetic medication.  HISTORY 10/15/2019CM Tiffany Arnold, 78 year old female returns for follow-up with a history of memory disturbance.  She reports today that she is having more word finding difficulty.  She is on a daily antibiotic to prevent bladder infections   She continues to live at home with her husband she has had one fall in the last 6 months, no apparent injury uses a rolling walker. She can continue to feed dress and bathe herself.  She does not do any cooking.  She does not drive.  She returns for reevaluation  REVIEW OF SYSTEMS: Out of a complete 14 system review of symptoms, the patient complains only of the following symptoms, and all  other reviewed systems are negative.  Memory loss   ALLERGIES: Allergies  Allergen Reactions  . Other Shortness Of Breath, Swelling and Other (See Comments)    Cats, dogs, mold Induced asthma Cats, dogs, mold    HOME MEDICATIONS: Outpatient Medications Prior to Visit  Medication Sig Dispense Refill  . acetaminophen (TYLENOL) 500 MG tablet Take 1,000 mg by mouth every 6 (six) hours as needed for moderate pain or headache.     . alendronate (FOSAMAX) 70 MG tablet TAKE 1 TABLET EVERY 7 DAYS WITH A FULL GLASS OF WATER ON AN EMPTY STOMACH DO NOT LIE DOWN FOR AT LEAST 30 MIN (Patient taking differently: Take 70 mg by mouth every Tuesday. ) 4 tablet 5  . Calcium Polycarbophil (FIBER-CAPS PO) Take by mouth.    . cetirizine (ZYRTEC) 10 MG tablet Take 10 mg by mouth as needed.     . Cholecalciferol (VITAMIN D-3) 125 MCG (5000 UT) TABS Take 5,000 Units by mouth daily.    Marland Kitchen EPINEPHrine 0.3 mg/0.3 mL IJ SOAJ injection Inject 0.3 mg into the muscle once as needed for anaphylaxis.     Marland Kitchen exemestane (AROMASIN) 25 MG tablet Take 1 tablet (25 mg total) by mouth daily. 30 tablet 6  . ferrous sulfate 325 (65 FE) MG tablet Take 325 mg by mouth daily with breakfast.    . glipiZIDE (GLUCOTROL) 5 MG tablet Take 1  tablet (5 mg total) by mouth daily before breakfast. 30 tablet 2  . lisinopril (ZESTRIL) 20 MG tablet Take 1 tablet (20 mg total) by mouth daily. 90 tablet 3  . memantine (NAMENDA) 10 MG tablet TAKE ONE TABLET TWICE DAILY 60 tablet 5  . Multiple Vitamins-Minerals (EYE VITAMINS PO) Take by mouth.    . nitrofurantoin, macrocrystal-monohydrate, (MACROBID) 100 MG capsule Take 1 capsule (100 mg total) by mouth 2 (two) times daily. 10 capsule 0  . Probiotic Product (PROBIOTIC PO) Take 1 capsule by mouth daily.     . rosuvastatin (CRESTOR) 10 MG tablet Take 1 tablet (10 mg total) by mouth daily. 30 tablet 2  . sertraline (ZOLOFT) 100 MG tablet Take 1 tablet (100 mg total) by mouth daily. 90 tablet 3  .  traMADol (ULTRAM) 50 MG tablet Take 1 tablet (50 mg total) by mouth every 8 (eight) hours as needed. 90 tablet 0  . ALPRAZolam (XANAX) 0.25 MG tablet TAKE ONE TABLET BY MOUTH TWICE DAILY AS NEEDED (Patient taking differently: Take 0.25 mg by mouth daily as needed for anxiety. ) 60 tablet 0   No facility-administered medications prior to visit.     PAST MEDICAL HISTORY: Past Medical History:  Diagnosis Date  . Allergic rhinitis, seasonal   . Arm bruise    left arm down to hand from fall 01-05-2019  . Bilateral renal cysts    simple (monitored by dr Karsten Ro)  . Chronic cystitis    urologist-- dr Karsten Ro  . GAD (generalized anxiety disorder)   . History of cancer chemotherapy 09-09-2016  to 11-12-2016   breast cancer  . History of external beam radiation therapy 12-24-2016  to 02-03-2017   right breast  . History of recurrent UTIs   . Humerus fracture 01/05/2019   left ,  hx total shoulder arthroplasty   . Hyperlipidemia   . Hypertension   . IBS (irritable bowel syndrome)    mixed  . Lymphedema of right upper extremity   . Malignant neoplasm of lower-inner quadrant of right breast of female, estrogen receptor positive [facility])    ONCOLOGIST-- dr Burr Medico;   dx 06/ 2017,  Stage IIB (pT1c N1 cM0), Grade 1-2,  DCIS,  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/  07-29-2016  s/p  right lumpectomy w/ sln dissections (1 out of 16 positve),   completed chemo 11-12-2016,  completed radiation 02-03-2017,  started anti-estrogen therapy 02-17-2017  . Memory disorder 06/22/2017   with intermittant confusion;  neurologist-- dr Krista Blue (note in epic)  . Mild persistent asthma    no inhaler  . OA (osteoarthritis)   . OSA on CPAP   . Type 2 diabetes, diet controlled ([place])   . Urge incontinence of urine   . Wears glasses     PAST SURGICAL HISTORY: Past Surgical History:  Procedure Laterality Date  . BREAST CYST EXCISION Right 2001   negative  . BREAST LUMPECTOMY WITH NEEDLE LOCALIZATION AND AXILLARY LYMPH NODE DISSECTION  Right 07/29/2016   Procedure: RIGHT BREAST LUMPECTOMY WITH DOUBLE NEEDLE LOCALIZATION AND COMPLETE RIGHT AXILLARY LYMPH NODE DISSECTION;  Surgeon: Fanny Skates, MD;  Location: [facility];  Service: General;  Laterality: Right;  . BREAST SURGERY Left 2000   Biopsy  . BUNIONECTOMY Bilateral 1998   great toe fusion on right foot  . CATARACT EXTRACTION W/ INTRAOCULAR LENS  IMPLANT, BILATERAL  right,  fall 2019;  left 2017  . COLONOSCOPY W/ POLYPECTOMY    . LUMBAR SPINE SURGERY  1970s  . NASAL SINUS SURGERY  2015  . ORIF HUMERUS FRACTURE Left 01/27/2019   Procedure: OPEN REDUCTION INTERNAL FIXATION (ORIF) LEFT HUMERUS;  Surgeon: Justice Britain, MD;  Location: [facility];  Service: Orthopedics;  Laterality: Left;  . PORTACATH PLACEMENT N/A 09/05/2016   Procedure: INSERTION PORT-A-CATH;  Surgeon: Fanny Skates, MD;  Location: [facility];  Service: General;  Laterality: N/A;  . TOTAL KNEE ARTHROPLASTY Bilateral 2007  . TOTAL SHOULDER REPLACEMENT Left 2011  . UMBILICAL HERNIA REPAIR  04-06-2014   @[facility]     FAMILY HISTORY: Family History  Problem Relation Age of Onset  . Arthritis Mother   . Stroke Mother   . Hypertension Mother   . Cancer Father        Prostate  . Stroke Father   . Hypertension Father   . Hypertension Maternal Grandmother   . Rheum arthritis Maternal Grandfather   . Stroke Maternal Grandfather   . Hypertension Maternal Grandfather   . Cancer Paternal Grandmother        Colon  . Hypertension Paternal Grandmother   . Hypertension Paternal Grandfather   . Breast cancer Neg Hx     SOCIAL HISTORY: Social History   Socioeconomic History  . Marital status: Married    Spouse name: Not on file  . Number of children: 2  . Years of education: 2 years college  . Highest education level: Not on file  Occupational History  . Occupation: Retired  Scientific laboratory technician  . Financial resource strain: Not on file  . Food insecurity    Worry: Not on file    Inability: Not on file  . Transportation  needs    Medical: Not on file    Non-medical: Not on file  Tobacco Use  . Smoking status: Former Smoker    Packs/day: 2.00    Years: 25.00    Pack years: 50.00    Quit date: 12/23/1991    Years since quitting: 27.6  . Smokeless tobacco: Never Used  Substance and Sexual Activity  . Alcohol use: Not Currently  . Drug use: No  . Sexual activity: Not Currently  Lifestyle  . Physical activity    Days per week: Not on file    Minutes per session: Not on file  . Stress: Not on file  Relationships  . Social Herbalist on phone: Not on file    Gets together: Not on file    Attends religious service: Not on file    Active member of club or organization: Not on file    Attends meetings of clubs or organizations: Not on file    Relationship status: Not on file  . Intimate partner violence    Fear of current or ex partner: Not on file    Emotionally abused: Not on file    Physically abused: Not on file    Forced sexual activity: Not on file  Other Topics Concern  . Not on file  Social History Narrative   Lives at home with her husband.   Right-handed.   Occasional caffeine use.   PHYSICAL EXAM  There were no vitals filed for this visit. There is no height or weight on file to calculate BMI.  Generalized: Well developed, in no acute distress  MMSE - Mini Mental State Exam 07/26/2019 10/05/2018 04/05/2018  Not completed: - (No Data) -  Orientation to time 3 4 4   Orientation to Place 3 3 4   Registration 3 3 3   Attention/ Calculation 0 2 3  Recall 1 0  0  Language- name 2 objects 2 2 2   Language- repeat 1 1 1   Language- follow 3 step command 3 2 2   Language- read & follow direction 1 1 1   Write a sentence 0 0 1  Copy design 1 1 1   Copy design-comments named 3 animals - -  Total score 18 19 22     Neurological examination  Mentation: Alert oriented to time, place, history taking. Follows all commands speech and language fluent Cranial nerve II-XII: Pupils were equal  round reactive to light. Extraocular movements were full, visual field were full on confrontational test. Facial sensation and strength were normal.  Head turning and shoulder shrug  were normal and symmetric. Motor: The motor testing reveals 5 over 5 strength of all 4 extremities. Good symmetric motor tone is noted throughout.  Sensory: Sensory testing is intact to soft touch on all 4 extremities. No evidence of extinction is noted.  Coordination: Cerebellar testing reveals good finger-nose-finger and heel-to-shin bilaterally.  Gait and station: Gait is unsteady, using a walker.  Tandem gait was not attempted Reflexes: Deep tendon reflexes are symmetric and normal bilaterally.   DIAGNOSTIC DATA (LABS, IMAGING, TESTING) - I reviewed patient records, labs, notes, testing and imaging myself where available.  Lab Results  Component Value Date   WBC 8.2 07/20/2019   HGB 13.9 07/20/2019   HCT 45.0 07/20/2019   MCV 93.9 07/20/2019   PLT 182 07/20/2019      Component Value Date/Time   NA 143 07/20/2019 1053   NA 140 09/22/2017 1003   K 5.5 (H) 07/20/2019 1053   K 3.8 09/22/2017 1003   CL 105 07/20/2019 1053   CO2 31 07/20/2019 1053   CO2 29 09/22/2017 1003   GLUCOSE 174 (H) 07/20/2019 1053   GLUCOSE 116 09/22/2017 1003   BUN 21 07/20/2019 1053   BUN 22.4 09/22/2017 1003   CREATININE 0.82 07/20/2019 1053   CREATININE 0.71 07/02/2018 1514   CREATININE 0.8 09/22/2017 1003   CALCIUM 9.9 07/20/2019 1053   CALCIUM 10.0 09/22/2017 1003   PROT 7.3 07/20/2019 1053   PROT 7.1 09/22/2017 1003   ALBUMIN 3.3 (L) 07/20/2019 1053   ALBUMIN 3.1 (L) 09/22/2017 1003   AST 18 07/20/2019 1053   AST 14 09/22/2017 1003   ALT 17 07/20/2019 1053   ALT 9 09/22/2017 1003   ALKPHOS 93 07/20/2019 1053   ALKPHOS 126 09/22/2017 1003   BILITOT 0.4 07/20/2019 1053   BILITOT 0.24 09/22/2017 1003   GFRNONAA >60 07/20/2019 1053   GFRAA >60 07/20/2019 1053   Lab Results  Component Value Date   CHOL 232 (H)  07/14/2019   HDL 35.00 (L) 07/14/2019   LDLCALC 130 (H) 07/02/2018   LDLDIRECT 175.0 07/14/2019   TRIG 271.0 (H) 07/14/2019   CHOLHDL 7 07/14/2019   Lab Results  Component Value Date   HGBA1C 8.1 (H) 07/14/2019   Lab Results  Component Value Date   VITAMINB12 1,443 (H) 04/27/2017   Lab Results  Component Value Date   TSH 0.656 04/27/2017    ASSESSMENT AND PLAN 78 y.o. year old female  has a past medical history of Allergic rhinitis, seasonal, Arm bruise, Bilateral renal cysts, Chronic cystitis, GAD (generalized anxiety disorder), History of cancer chemotherapy (09-09-2016  to 11-12-2016), History of external beam radiation therapy (12-24-2016  to 02-03-2017), History of recurrent UTIs, Humerus fracture (01/05/2019), Hyperlipidemia, Hypertension, IBS (irritable bowel syndrome), Lymphedema of right upper extremity, Malignant neoplasm of lower-inner quadrant of right breast of female,  estrogen receptor positive ([place]), Memory disorder (06/22/2017), Mild persistent asthma, OA (osteoarthritis), OSA on CPAP, Type 2 diabetes, diet controlled ([place]), Urge incontinence of urine, and Wears glasses. here with:  1.  Memory disturbance  Her memory score is stable.  She will continue on Namenda.  We have not offered Aricept due to her episodes of diarrhea.  She continues to have difficulty with word finding and expression.  I will offer her a referral to neuro rehab to discuss with the speech therapist to see if they may offer any techniques or strategies that may be beneficial.  She will continue follow-up with her primary care doctor.  I encouraged her to incorporate exercise as tolerated, and brain stimulating exercises to promote memory. She does use a walker and has bed rails in her bed to prevent falls.  She will follow-up in 6 months or sooner if needed.  I advised that if her symptoms worsen or she develops any new symptoms and let us know.   I spent 15 minutes with the patient. 50% of this time  was spent discussing her plan of care.   Butler Denmark, AGNP-C, DNP 07/26/2019, 1:01 PM [facility] [address] [place], [place] [address] [phone]

## 2019-07-26 ENCOUNTER — Ambulatory Visit (INDEPENDENT_AMBULATORY_CARE_PROVIDER_SITE_OTHER): Payer: Medicare PPO | Admitting: Neurology

## 2019-07-26 ENCOUNTER — Other Ambulatory Visit: Payer: Self-pay

## 2019-07-26 ENCOUNTER — Encounter: Payer: Self-pay | Admitting: Neurology

## 2019-07-26 VITALS — BP 170/90 | HR 74 | Temp 98.6°F | Ht 60.0 in | Wt 233.2 lb

## 2019-07-26 DIAGNOSIS — R413 Other amnesia: Secondary | ICD-10-CM

## 2019-07-26 NOTE — Patient Instructions (Signed)
You may continue Namenda. I will place referral for Speech Therapy to see if they may offer any technique for word finding difficulty. I would encourage exercise as tolerated, brain stimulating exercises (puzzles, games, reading). We will see you in 6 months!

## 2019-07-26 NOTE — Progress Notes (Signed)
I have read the note, and I agree with the clinical assessment and plan.  Charles K Willis   

## 2019-08-03 ENCOUNTER — Telehealth: Payer: Self-pay | Admitting: *Deleted

## 2019-08-03 NOTE — Telephone Encounter (Signed)
We can do a phone visit, she can drop off a urine. But I won't just call in abx.

## 2019-08-03 NOTE — Telephone Encounter (Signed)
Patient's sister [name] called stating that patient has started acting wierd again like she has a UTI. [name] stated that patient has finished the antibioticthat was recently given to her and thinks that she needs more. Offered patient a visit in office or virtual which [name] declined stating that it is hard to get patient out for a visit and they do not have equipment to do a virtual. [name] requested that a message go back to [name] NP to see if she will just call her more medication in or if she can just drop off a urine?

## 2019-08-04 ENCOUNTER — Telehealth: Payer: Self-pay | Admitting: Internal Medicine

## 2019-08-04 DIAGNOSIS — R300 Dysuria: Secondary | ICD-10-CM | POA: Diagnosis not present

## 2019-08-04 NOTE — Telephone Encounter (Signed)
Spoke with Rollene Fare, states that the patient needs to be seen in office/[facility] or telemedicine. We cannot test urine without an office visit. [name] is not wanting to do a visit as this is an ongoing UTI issue, most recently treated on 07/14/2019 and has yet to resolve. I explained that the patient needs to really be seen via one of the 3 forms of office visit so that she can receive the best patient care. [name] states that she does not understand why she has to be seen again given she was just seen by [facility] (a PA with [employer]) and was given an antibiotic for the possible UTI. I explained several times the need for the office visit with Rollene Fare and [name] was still not in agreeance.   Spoke with Leafy Ro and explained the situation to her and asked that Leafy Ro speak with [name] to help explain again the need for an OV with our office in order to test the urine. Call transferred to [facility].

## 2019-08-04 NOTE — Telephone Encounter (Signed)
I spoke at length with patient's sister [name].  She explained her situation and her concerns and difficulties with getting her sister in and out of the office.  She also explained that she didn't understand why we couldn't just run the urine based off of the recommendation of the PA.    I did review concerns with Jacquelynn Cree.  She does not feel comfortable running a urine sample that was recommended by an outside PA or agreeing for patient to take a prescription that she did not prescribe without her own official evaluation.   I explained to the sister that this is ethically appropriate and within the providers right to make this determination.  There, sometimes, can be other issues going on contributing to symptoms, some very serious, that the provider may feel necessary to rule out.  [name], NP is willing to do a virtual and/or a telephone visit with the patient/responsible party since patient has difficulty coming in the office.  But, a visit encounter with Rollene Fare is required for [place] to make evaluation and treatment decisions. Sister refused all forms of evaluation, including a phone visit encounter.   Sister does not agree with this and will call [facility] PA back to make them aware "that her doctor is being disagreeable".    I apologized that that is her perception, but it is very appropriate for the provider to make this decision and is professionally, ethically responsible.

## 2019-08-04 NOTE — Telephone Encounter (Signed)
Spoke with Tiffany Arnold Pt was seen yesterday by a nurse with her Insurance - given abx for a UTI.  Pt having some confusion, "going loopy", frantic Denies any urgency, frequency or pain with urination but does have some tenderness. Needing U/A to be done to confirm - given collection cup by insurance for patient to use. [name] is asking if this is okay to do without being seen by [name] since her insurance has already "treated" her for possible "UTI"  Pt has not started the abx yet - was given Lovenox TID  Patient is waiting on further instruction.

## 2019-08-15 ENCOUNTER — Encounter: Payer: Self-pay | Admitting: Emergency Medicine

## 2019-08-15 ENCOUNTER — Other Ambulatory Visit (INDEPENDENT_AMBULATORY_CARE_PROVIDER_SITE_OTHER): Payer: Self-pay | Admitting: Vascular Surgery

## 2019-08-15 ENCOUNTER — Inpatient Hospital Stay: Payer: Medicare PPO | Admitting: Anesthesiology

## 2019-08-15 ENCOUNTER — Inpatient Hospital Stay
Admission: EM | Admit: 2019-08-15 | Discharge: 2019-08-17 | DRG: 271 | Disposition: A | Discharge status: Home Health | Payer: Medicare PPO | Attending: Internal Medicine | Admitting: Internal Medicine

## 2019-08-15 ENCOUNTER — Other Ambulatory Visit: Payer: Self-pay

## 2019-08-15 ENCOUNTER — Emergency Department: Payer: Medicare PPO

## 2019-08-15 ENCOUNTER — Encounter
Admission: EM | Disposition: A | Discharge status: Home Health | Payer: Self-pay | Source: Home / Self Care | Attending: Internal Medicine

## 2019-08-15 DIAGNOSIS — I890 Lymphedema, not elsewhere classified: Secondary | ICD-10-CM | POA: Diagnosis not present

## 2019-08-15 DIAGNOSIS — I82411 Acute embolism and thrombosis of right femoral vein: Secondary | ICD-10-CM

## 2019-08-15 DIAGNOSIS — F0390 Unspecified dementia without behavioral disturbance: Secondary | ICD-10-CM | POA: Diagnosis present

## 2019-08-15 DIAGNOSIS — I80201 Phlebitis and thrombophlebitis of unspecified deep vessels of right lower extremity: Secondary | ICD-10-CM

## 2019-08-15 DIAGNOSIS — Z79811 Long term (current) use of aromatase inhibitors: Secondary | ICD-10-CM | POA: Diagnosis not present

## 2019-08-15 DIAGNOSIS — G4733 Obstructive sleep apnea (adult) (pediatric): Secondary | ICD-10-CM | POA: Diagnosis not present

## 2019-08-15 DIAGNOSIS — Z823 Family history of stroke: Secondary | ICD-10-CM

## 2019-08-15 DIAGNOSIS — M810 Age-related osteoporosis without current pathological fracture: Secondary | ICD-10-CM | POA: Diagnosis present

## 2019-08-15 DIAGNOSIS — Z7984 Long term (current) use of oral hypoglycemic drugs: Secondary | ICD-10-CM

## 2019-08-15 DIAGNOSIS — E785 Hyperlipidemia, unspecified: Secondary | ICD-10-CM | POA: Diagnosis present

## 2019-08-15 DIAGNOSIS — E119 Type 2 diabetes mellitus without complications: Secondary | ICD-10-CM | POA: Diagnosis present

## 2019-08-15 DIAGNOSIS — D6859 Other primary thrombophilia: Secondary | ICD-10-CM | POA: Diagnosis not present

## 2019-08-15 DIAGNOSIS — F411 Generalized anxiety disorder: Secondary | ICD-10-CM | POA: Diagnosis present

## 2019-08-15 DIAGNOSIS — Z03818 Encounter for observation for suspected exposure to other biological agents ruled out: Secondary | ICD-10-CM | POA: Diagnosis not present

## 2019-08-15 DIAGNOSIS — I82409 Acute embolism and thrombosis of unspecified deep veins of unspecified lower extremity: Secondary | ICD-10-CM | POA: Diagnosis present

## 2019-08-15 DIAGNOSIS — I10 Essential (primary) hypertension: Secondary | ICD-10-CM | POA: Diagnosis present

## 2019-08-15 DIAGNOSIS — J4530 Mild persistent asthma, uncomplicated: Secondary | ICD-10-CM | POA: Diagnosis present

## 2019-08-15 DIAGNOSIS — M79671 Pain in right foot: Secondary | ICD-10-CM | POA: Diagnosis not present

## 2019-08-15 DIAGNOSIS — Z79899 Other long term (current) drug therapy: Secondary | ICD-10-CM

## 2019-08-15 DIAGNOSIS — Z20828 Contact with and (suspected) exposure to other viral communicable diseases: Secondary | ICD-10-CM | POA: Diagnosis present

## 2019-08-15 DIAGNOSIS — C50311 Malignant neoplasm of lower-inner quadrant of right female breast: Secondary | ICD-10-CM | POA: Diagnosis present

## 2019-08-15 DIAGNOSIS — M79604 Pain in right leg: Secondary | ICD-10-CM | POA: Diagnosis not present

## 2019-08-15 DIAGNOSIS — C50919 Malignant neoplasm of unspecified site of unspecified female breast: Secondary | ICD-10-CM | POA: Diagnosis not present

## 2019-08-15 DIAGNOSIS — Z7983 Long term (current) use of bisphosphonates: Secondary | ICD-10-CM | POA: Diagnosis not present

## 2019-08-15 DIAGNOSIS — I959 Hypotension, unspecified: Secondary | ICD-10-CM | POA: Diagnosis not present

## 2019-08-15 DIAGNOSIS — Z170 Estrogen receptor positive status [ER+]: Secondary | ICD-10-CM

## 2019-08-15 DIAGNOSIS — I80231 Phlebitis and thrombophlebitis of right tibial vein: Secondary | ICD-10-CM | POA: Diagnosis not present

## 2019-08-15 DIAGNOSIS — Z9221 Personal history of antineoplastic chemotherapy: Secondary | ICD-10-CM | POA: Diagnosis not present

## 2019-08-15 DIAGNOSIS — Z87891 Personal history of nicotine dependence: Secondary | ICD-10-CM

## 2019-08-15 DIAGNOSIS — Z923 Personal history of irradiation: Secondary | ICD-10-CM | POA: Diagnosis not present

## 2019-08-15 DIAGNOSIS — R609 Edema, unspecified: Secondary | ICD-10-CM | POA: Diagnosis not present

## 2019-08-15 DIAGNOSIS — J302 Other seasonal allergic rhinitis: Secondary | ICD-10-CM | POA: Diagnosis present

## 2019-08-15 DIAGNOSIS — J45909 Unspecified asthma, uncomplicated: Secondary | ICD-10-CM | POA: Diagnosis not present

## 2019-08-15 DIAGNOSIS — R262 Difficulty in walking, not elsewhere classified: Secondary | ICD-10-CM | POA: Diagnosis not present

## 2019-08-15 DIAGNOSIS — Z8249 Family history of ischemic heart disease and other diseases of the circulatory system: Secondary | ICD-10-CM

## 2019-08-15 DIAGNOSIS — N3020 Other chronic cystitis without hematuria: Secondary | ICD-10-CM | POA: Diagnosis present

## 2019-08-15 DIAGNOSIS — M79651 Pain in right thigh: Secondary | ICD-10-CM | POA: Diagnosis not present

## 2019-08-15 DIAGNOSIS — R410 Disorientation, unspecified: Secondary | ICD-10-CM | POA: Diagnosis not present

## 2019-08-15 DIAGNOSIS — I82401 Acute embolism and thrombosis of unspecified deep veins of right lower extremity: Secondary | ICD-10-CM | POA: Diagnosis not present

## 2019-08-15 DIAGNOSIS — I82421 Acute embolism and thrombosis of right iliac vein: Secondary | ICD-10-CM | POA: Diagnosis not present

## 2019-08-15 DIAGNOSIS — R52 Pain, unspecified: Secondary | ICD-10-CM | POA: Diagnosis not present

## 2019-08-15 DIAGNOSIS — Z9989 Dependence on other enabling machines and devices: Secondary | ICD-10-CM | POA: Diagnosis not present

## 2019-08-15 HISTORY — PX: PERIPHERAL VASCULAR THROMBECTOMY: CATH118306

## 2019-08-15 LAB — CBC WITH DIFFERENTIAL/PLATELET
Abs Immature Granulocytes: 0.03 10*3/uL (ref 0.00–0.07)
Basophils Absolute: 0 10*3/uL (ref 0.0–0.1)
Basophils Relative: 0 %
Eosinophils Absolute: 0.1 10*3/uL (ref 0.0–0.5)
Eosinophils Relative: 1 %
HCT: 41.4 % (ref 36.0–46.0)
Hemoglobin: 12.8 g/dL (ref 12.0–15.0)
Immature Granulocytes: 0 %
Lymphocytes Relative: 16 %
Lymphs Abs: 1.4 10*3/uL (ref 0.7–4.0)
MCH: 28.6 pg (ref 26.0–34.0)
MCHC: 30.9 g/dL (ref 30.0–36.0)
MCV: 92.6 fL (ref 80.0–100.0)
Monocytes Absolute: 0.6 10*3/uL (ref 0.1–1.0)
Monocytes Relative: 6 %
Neutro Abs: 7 10*3/uL (ref 1.7–7.7)
Neutrophils Relative %: 77 %
Platelets: 259 10*3/uL (ref 150–400)
RBC: 4.47 MIL/uL (ref 3.87–5.11)
RDW: 13.8 % (ref 11.5–15.5)
WBC: 9.2 10*3/uL (ref 4.0–10.5)
nRBC: 0 % (ref 0.0–0.2)

## 2019-08-15 LAB — COMPREHENSIVE METABOLIC PANEL
ALT: 15 U/L (ref 0–44)
AST: 20 U/L (ref 15–41)
Albumin: 3.5 g/dL (ref 3.5–5.0)
Alkaline Phosphatase: 85 U/L (ref 38–126)
Anion gap: 9 (ref 5–15)
BUN: 17 mg/dL (ref 8–23)
CO2: 29 mmol/L (ref 22–32)
Calcium: 9.7 mg/dL (ref 8.9–10.3)
Chloride: 103 mmol/L (ref 98–111)
Creatinine, Ser: 0.71 mg/dL (ref 0.44–1.00)
GFR calc Af Amer: >60 mL/min (ref >60)
GFR calc non Af Amer: >60 mL/min (ref >60)
Glucose, Bld: 150 mg/dL — ABNORMAL HIGH (ref 70–99)
Potassium: 3.8 mmol/L (ref 3.5–5.1)
Sodium: 141 mmol/L (ref 135–145)
Total Bilirubin: 0.4 mg/dL (ref 0.3–1.2)
Total Protein: 7.7 g/dL (ref 6.5–8.1)

## 2019-08-15 LAB — GLUCOSE, CAPILLARY: Glucose-Capillary: 131 mg/dL — ABNORMAL HIGH (ref 70–99)

## 2019-08-15 LAB — PROTIME-INR
INR: 1 (ref 0.8–1.2)
Prothrombin Time: 13.2 seconds (ref 11.4–15.2)

## 2019-08-15 LAB — SARS CORONAVIRUS 2 BY RT PCR (HOSPITAL ORDER, PERFORMED IN ~~LOC~~ HOSPITAL LAB): SARS Coronavirus 2: NEGATIVE

## 2019-08-15 LAB — APTT: aPTT: 28 seconds (ref 24–36)

## 2019-08-15 SURGERY — PERIPHERAL VASCULAR THROMBECTOMY
Anesthesia: General | Laterality: Right

## 2019-08-15 MED ORDER — ONDANSETRON HCL 4 MG/2ML IJ SOLN: 4.0000 mg | Freq: Four times a day (QID) | INTRAMUSCULAR | Status: DC | PRN

## 2019-08-15 MED ORDER — FENTANYL CITRATE (PF) 100 MCG/2ML IJ SOLN
INTRAMUSCULAR | Status: AC
  Filled 2019-08-15: qty 2

## 2019-08-15 MED ORDER — HYDROMORPHONE HCL 1 MG/ML IJ SOLN: 1.0000 mg | Freq: Once | INTRAMUSCULAR | Status: DC | PRN

## 2019-08-15 MED ORDER — FENTANYL CITRATE (PF) 100 MCG/2ML IJ SOLN
INTRAMUSCULAR | Status: AC
  Filled 2019-08-15: qty 4

## 2019-08-15 MED ORDER — FENTANYL CITRATE (PF) 100 MCG/2ML IJ SOLN
INTRAMUSCULAR | Status: AC
  Administered 2019-08-15: 25 ug via INTRAVENOUS
  Filled 2019-08-15: qty 2

## 2019-08-15 MED ORDER — HEPARIN (PORCINE) 25000 UT/250ML-% IV SOLN
1000.0000 [IU]/h | INTRAVENOUS | Status: DC
  Administered 2019-08-15 (×2): 1250 [IU]/h via INTRAVENOUS
  Filled 2019-08-15 (×2): qty 250

## 2019-08-15 MED ORDER — OXYCODONE HCL 5 MG PO TABS: 5.0000 mg | ORAL_TABLET | Freq: Once | ORAL | Status: DC | PRN

## 2019-08-15 MED ORDER — INSULIN ASPART 100 UNIT/ML ~~LOC~~ SOLN: 0.0000 [IU] | Freq: Three times a day (TID) | SUBCUTANEOUS | Status: DC

## 2019-08-15 MED ORDER — HEPARIN SODIUM (PORCINE) 1000 UNIT/ML IJ SOLN
INTRAMUSCULAR | Status: DC | PRN
  Administered 2019-08-15 (×2): 3000 [IU] via INTRAVENOUS

## 2019-08-15 MED ORDER — MORPHINE SULFATE (PF) 2 MG/ML IV SOLN
2.0000 mg | INTRAVENOUS | Status: DC | PRN
  Administered 2019-08-16: 04:00:00 2 mg via INTRAVENOUS
  Filled 2019-08-15: qty 1

## 2019-08-15 MED ORDER — MIDAZOLAM HCL 2 MG/2ML IJ SOLN
INTRAMUSCULAR | Status: DC | PRN
  Administered 2019-08-15 (×4): 1 mg via INTRAVENOUS

## 2019-08-15 MED ORDER — FENTANYL CITRATE (PF) 100 MCG/2ML IJ SOLN
INTRAMUSCULAR | Status: AC
  Administered 2019-08-15: 50 ug via INTRAVENOUS
  Filled 2019-08-15: qty 2

## 2019-08-15 MED ORDER — PHENYLEPHRINE HCL (PRESSORS) 10 MG/ML IV SOLN
INTRAVENOUS | Status: DC | PRN
  Administered 2019-08-15: 100 ug via INTRAVENOUS

## 2019-08-15 MED ORDER — ONDANSETRON HCL 4 MG/2ML IJ SOLN
4.0000 mg | Freq: Four times a day (QID) | INTRAMUSCULAR | Status: DC | PRN
  Administered 2019-08-16: 4 mg via INTRAVENOUS
  Filled 2019-08-15: qty 2

## 2019-08-15 MED ORDER — PROPOFOL 10 MG/ML IV BOLUS
INTRAVENOUS | Status: AC
  Filled 2019-08-15: qty 40

## 2019-08-15 MED ORDER — HEPARIN SODIUM (PORCINE) 1000 UNIT/ML IJ SOLN
INTRAMUSCULAR | Status: AC
  Filled 2019-08-15: qty 1

## 2019-08-15 MED ORDER — FENTANYL CITRATE (PF) 100 MCG/2ML IJ SOLN
INTRAMUSCULAR | Status: DC | PRN
  Administered 2019-08-15 (×3): 50 ug via INTRAVENOUS

## 2019-08-15 MED ORDER — SUGAMMADEX SODIUM 200 MG/2ML IV SOLN
INTRAVENOUS | Status: DC | PRN
  Administered 2019-08-15: 210 mg via INTRAVENOUS

## 2019-08-15 MED ORDER — SODIUM CHLORIDE 0.9 % IV SOLN
INTRAVENOUS | Status: DC
  Administered 2019-08-15: 23:00:00 via INTRAVENOUS

## 2019-08-15 MED ORDER — MIDAZOLAM HCL 5 MG/5ML IJ SOLN
INTRAMUSCULAR | Status: AC
  Filled 2019-08-15: qty 5

## 2019-08-15 MED ORDER — OXYCODONE HCL 5 MG/5ML PO SOLN
5.0000 mg | Freq: Once | ORAL | Status: DC | PRN
  Filled 2019-08-15: qty 5

## 2019-08-15 MED ORDER — FAMOTIDINE 20 MG PO TABS: 40.0000 mg | ORAL_TABLET | Freq: Once | ORAL | Status: DC | PRN

## 2019-08-15 MED ORDER — METHYLPREDNISOLONE SODIUM SUCC 125 MG IJ SOLR: 125.0000 mg | Freq: Once | INTRAMUSCULAR | Status: DC | PRN

## 2019-08-15 MED ORDER — IODIXANOL 320 MG/ML IV SOLN
INTRAVENOUS | Status: DC | PRN
  Administered 2019-08-15: 100 mL via INTRAVENOUS

## 2019-08-15 MED ORDER — SODIUM CHLORIDE 0.9 % IV SOLN
INTRAVENOUS | Status: DC
  Administered 2019-08-15: 1000 mL via INTRAVENOUS

## 2019-08-15 MED ORDER — CEFAZOLIN SODIUM-DEXTROSE 2-4 GM/100ML-% IV SOLN
INTRAVENOUS | Status: AC
  Filled 2019-08-15: qty 100

## 2019-08-15 MED ORDER — PROPOFOL 10 MG/ML IV BOLUS
INTRAVENOUS | Status: DC | PRN
  Administered 2019-08-15: 100 mg via INTRAVENOUS

## 2019-08-15 MED ORDER — DIPHENHYDRAMINE HCL 50 MG/ML IJ SOLN
INTRAMUSCULAR | Status: AC
  Filled 2019-08-15: qty 1

## 2019-08-15 MED ORDER — ALTEPLASE 2 MG IJ SOLR
INTRAMUSCULAR | Status: AC
  Filled 2019-08-15: qty 10

## 2019-08-15 MED ORDER — EPHEDRINE SULFATE 50 MG/ML IJ SOLN
INTRAMUSCULAR | Status: DC | PRN
  Administered 2019-08-15 (×2): 10 mg via INTRAVENOUS

## 2019-08-15 MED ORDER — MIDAZOLAM HCL 2 MG/ML PO SYRP: 8.0000 mg | ORAL_SOLUTION | Freq: Once | ORAL | Status: DC | PRN

## 2019-08-15 MED ORDER — HEPARIN BOLUS VIA INFUSION
4500.0000 [IU] | Freq: Once | INTRAVENOUS | Status: AC
  Administered 2019-08-15: 4500 [IU] via INTRAVENOUS
  Filled 2019-08-15: qty 4500

## 2019-08-15 MED ORDER — ONDANSETRON HCL 4 MG PO TABS: 4.0000 mg | ORAL_TABLET | Freq: Four times a day (QID) | ORAL | Status: DC | PRN

## 2019-08-15 MED ORDER — HEPARIN SODIUM (PORCINE) 1000 UNIT/ML IJ SOLN
INTRAMUSCULAR | Status: DC | PRN
  Administered 2019-08-15: 4000 [IU] via INTRAVENOUS

## 2019-08-15 MED ORDER — FENTANYL CITRATE (PF) 100 MCG/2ML IJ SOLN
25.0000 ug | INTRAMUSCULAR | Status: DC | PRN
  Administered 2019-08-15 (×3): 25 ug via INTRAVENOUS
  Administered 2019-08-15: 50 ug via INTRAVENOUS
  Administered 2019-08-15: 25 ug via INTRAVENOUS

## 2019-08-15 MED ORDER — CEFAZOLIN SODIUM-DEXTROSE 2-4 GM/100ML-% IV SOLN
2.0000 g | Freq: Once | INTRAVENOUS | Status: AC
  Administered 2019-08-15: 2 g via INTRAVENOUS

## 2019-08-15 MED ORDER — SUCCINYLCHOLINE CHLORIDE 20 MG/ML IJ SOLN
INTRAMUSCULAR | Status: DC | PRN
  Administered 2019-08-15: 100 mg via INTRAVENOUS

## 2019-08-15 MED ORDER — DIPHENHYDRAMINE HCL 50 MG/ML IJ SOLN: 50.0000 mg | Freq: Once | INTRAMUSCULAR | Status: DC | PRN

## 2019-08-15 MED ORDER — ROCURONIUM BROMIDE 100 MG/10ML IV SOLN
INTRAVENOUS | Status: DC | PRN
  Administered 2019-08-15: 20 mg via INTRAVENOUS
  Administered 2019-08-15: 10 mg via INTRAVENOUS

## 2019-08-15 MED ORDER — FENTANYL CITRATE (PF) 100 MCG/2ML IJ SOLN
INTRAMUSCULAR | Status: DC | PRN
  Administered 2019-08-15: 25 ug via INTRAVENOUS

## 2019-08-15 SURGICAL SUPPLY — 23 items
BALLN ATG 14X6X80 (BALLOONS) ×3
BALLN ULTRVRSE 12X40X75 (BALLOONS) ×3
BALLOON ATG 14X6X80 (BALLOONS) IMPLANT
BALLOON ULTRVRSE 12X40X75 (BALLOONS) IMPLANT
CANISTER PENUMBRA ENGINE (MISCELLANEOUS) ×2 IMPLANT
CANNULA 5F STIFF (CANNULA) ×2 IMPLANT
CATH BEACON 5 .035 65 KMP TIP (CATHETERS) ×2 IMPLANT
CATH INDIGO 12XTORQ 100 (CATHETERS) ×2 IMPLANT
CATH INDIGO SEP 12 (CATHETERS) ×2 IMPLANT
CATH INFUS 90CMX20CM (CATHETERS) ×2 IMPLANT
DEVICE PRESTO INFLATION (MISCELLANEOUS) ×2 IMPLANT
DEVICE SAFEGUARD 24CM (GAUZE/BANDAGES/DRESSINGS) ×2 IMPLANT
DEVICE TORQUE .025-.038 (MISCELLANEOUS) ×2 IMPLANT
GLIDEWIRE STIFF .35X180X3 HYDR (WIRE) ×2 IMPLANT
GUIDEWIRE SUPER STIFF .035X180 (WIRE) ×2 IMPLANT
KIT FEMORAL DEL DENALI (Miscellaneous) ×2 IMPLANT
NDL ENTRY 21GA 7CM ECHOTIP (NEEDLE) IMPLANT
NEEDLE ENTRY 21GA 7CM ECHOTIP (NEEDLE) ×3 IMPLANT
PACK ANGIOGRAPHY (CUSTOM PROCEDURE TRAY) ×3 IMPLANT
SET INTRO CAPELLA COAXIAL (SET/KITS/TRAYS/PACK) ×2 IMPLANT
STENT VENOVO 16X60X80 (Permanent Stent) ×2 IMPLANT
WIRE J 3MM .035X145CM (WIRE) ×5 IMPLANT
WIRE MAGIC TORQUE 260C (WIRE) ×2 IMPLANT

## 2019-08-15 NOTE — ED Triage Notes (Signed)
Patient from home via [facility]. Patient with significant swelling, pain and redness noted to entire length of right leg. Patient states she is unsure of how long leg has been swollen but states she started to get concerned with swelling would not go down. Patient denies any new CP or SOB.

## 2019-08-15 NOTE — H&P (Signed)
[facility] [place] NAME: Tiffany Arnold    MR#:  [id number]  DATE OF BIRTH:  [birth year]  DATE OF ADMISSION:  08/15/2019  PRIMARY CARE PHYSICIAN: [name], NP   REQUESTING/REFERRING PHYSICIAN: Lenise Arena  CHIEF COMPLAINT:  Worsening leg pain  HISTORY OF PRESENT ILLNESS:  Tiffany Arnold  is a 78 y.o. female with a known history of stage II breast cancer status post chemotherapy and radiation therapy, lymphedema, hypertension, diabetes mellitus and hyperlipidemia and obstructive sleep apnea on CPAP nightly is presenting to the ED with a chief complaint of worsening of the right leg pain associated with swelling and redness.  Venous Dopplers have revealed extensive DVT which is occluded and extending to the pelvis.  Stat vascular surgery consult placed and patient was seen by them and who is recommending emergent thrombolysis.  Hospitalist team is called admit the patient.  During my examination patient denies any chest pain or shortness of breath.  Copy test is negative.  Daughter at bedside.  PAST MEDICAL HISTORY:   Past Medical History:  Diagnosis Date  . Allergic rhinitis, seasonal   . Arm bruise    left arm down to hand from fall 01-05-2019  . Bilateral renal cysts    simple (monitored by dr Karsten Ro)  . Chronic cystitis    urologist-- dr Karsten Ro  . GAD (generalized anxiety disorder)   . History of cancer chemotherapy 09-09-2016  to 11-12-2016   breast cancer  . History of external beam radiation therapy 12-24-2016  to 02-03-2017   right breast  . History of recurrent UTIs   . Humerus fracture 01/05/2019   left ,  hx total shoulder arthroplasty   . Hyperlipidemia   . Hypertension   . IBS (irritable bowel syndrome)    mixed  . Lymphedema of right upper extremity   . 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 Memory disorder 06/22/2017   with intermittant confusion;  neurologist-- dr Krista Blue (note in epic)  . Mild persistent asthma    no inhaler  . OA (osteoarthritis)   . OSA on CPAP   . Type 2 diabetes, diet controlled ([place])   . Urge incontinence of urine   . Wears glasses     PAST SURGICAL HISTOIRY:   Past Surgical History:  Procedure Laterality Date  . BREAST CYST EXCISION Right 2001   negative  . BREAST LUMPECTOMY WITH NEEDLE LOCALIZATION AND AXILLARY LYMPH NODE DISSECTION Right 07/29/2016   Procedure: RIGHT BREAST LUMPECTOMY WITH DOUBLE NEEDLE LOCALIZATION AND COMPLETE RIGHT AXILLARY LYMPH NODE DISSECTION;  Surgeon: Fanny Skates, MD;  Location: [facility];  Service: General;  Laterality: Right;  . BREAST SURGERY Left 2000   Biopsy  . BUNIONECTOMY Bilateral 1998   great toe fusion on right foot  . CATARACT EXTRACTION W/ INTRAOCULAR LENS  IMPLANT, BILATERAL  right,  fall 2019;  left 2017  . COLONOSCOPY W/ POLYPECTOMY    . LUMBAR SPINE SURGERY  1970s  . NASAL SINUS SURGERY  2015  . ORIF HUMERUS FRACTURE Left 01/27/2019   Procedure: OPEN REDUCTION INTERNAL FIXATION (ORIF) LEFT HUMERUS;  Surgeon: Justice Britain, MD;  Location: [facility];  Service: Orthopedics;  Laterality: Left;  . PORTACATH PLACEMENT N/A 09/05/2016   Procedure: INSERTION PORT-A-CATH;  Surgeon: Fanny Skates, MD;  Location: [facility];  Service: General;  Laterality: N/A;  . TOTAL KNEE ARTHROPLASTY Bilateral 2007  . TOTAL SHOULDER REPLACEMENT Left 2011  . UMBILICAL HERNIA REPAIR  04-06-2014   @[facility]     SOCIAL HISTORY:   Social History   Tobacco Use  . Smoking status: Former Smoker    Packs/day: 2.00    Years: 25.00    Pack years: 50.00    Quit date: 12/23/1991    Years since  quitting: 27.6  . Smokeless tobacco: Never Used  Substance Use Topics  . Alcohol use: Not Currently    FAMILY HISTORY:   Family History  Problem Relation Age of Onset  . Arthritis Mother   . Stroke Mother   . Hypertension Mother   . Cancer Father        Prostate  . Stroke Father   . Hypertension Father   . Hypertension Maternal Grandmother   . Rheum arthritis Maternal Grandfather   . Stroke Maternal Grandfather   . Hypertension Maternal Grandfather   . Cancer Paternal Grandmother        Colon  . Hypertension Paternal Grandmother   . Hypertension Paternal Grandfather   . Breast cancer Neg Hx     DRUG ALLERGIES:   Allergies  Allergen Reactions  . Other Shortness Of Breath, Swelling and Other (See Comments)    Cats, dogs, mold Induced asthma Cats, dogs, mold    REVIEW OF SYSTEMS:  CONSTITUTIONAL: No fever, fatigue or weakness.  EYES: No blurred or double vision.  EARS, NOSE, AND THROAT: No tinnitus or ear pain.  RESPIRATORY: No cough, shortness of breath, wheezing or hemoptysis.  CARDIOVASCULAR: No chest pain, orthopnea, edema.  GASTROINTESTINAL: No nausea, vomiting, diarrhea or abdominal pain.  GENITOURINARY: No dysuria, hematuria.  ENDOCRINE: No polyuria, nocturia,  HEMATOLOGY: No anemia, easy bruising or bleeding SKIN: No rash or lesion. MUSCULOSKELETAL: Worsening of the right leg pain associated with swelling and redness NEUROLOGIC: No tingling, numbness, weakness.  PSYCHIATRY: No anxiety or depression.   MEDICATIONS AT HOME:   Prior to Admission medications   Medication Sig Start Date End Date Taking? Authorizing Provider  acetaminophen (TYLENOL) 500 MG tablet Take 1,000 mg by mouth every 6 (six) hours as needed for moderate pain or headache.    Yes [provider]  alendronate (FOSAMAX) 70 MG tablet TAKE 1 TABLET EVERY 7 DAYS WITH A FULL GLASS OF WATER ON AN EMPTY STOMACH DO NOT LIE DOWN FOR AT LEAST 30 MIN Patient taking differently: Take 70 mg  by mouth every Tuesday.  01/24/19  Yes Truitt Merle, MD  cetirizine (ZYRTEC) 10 MG tablet Take 10 mg by mouth every evening.    Yes [provider]  Cholecalciferol (VITAMIN D-3) 125 MCG (5000 UT) TABS Take 5,000 Units by mouth daily.   Yes [provider]  exemestane (AROMASIN) 25 MG tablet Take 1 tablet (25 mg total) by mouth daily. 01/20/19  Yes Truitt Merle, MD  ferrous sulfate 325 (65 FE) MG tablet Take 325 mg by mouth daily with breakfast.   Yes [provider]  glipiZIDE (GLUCOTROL) 5 MG tablet Take 1 tablet (5 mg total) by mouth daily before breakfast. 07/20/19  Yes Baity, Coralie Keens, NP  lisinopril (ZESTRIL) 20 MG tablet Take 1 tablet (20 mg total) by mouth daily. 07/14/19  Yes Baity,  Coralie Keens, NP  memantine (NAMENDA) 10 MG tablet TAKE ONE TABLET TWICE DAILY 04/19/19  Yes Baity, Coralie Keens, NP  nitrofurantoin, macrocrystal-monohydrate, (MACROBID) 100 MG capsule Take 1 capsule (100 mg total) by mouth 2 (two) times daily. Patient taking differently: Take 100 mg by mouth every evening.  07/14/19  Yes Baity, Coralie Keens, NP  Probiotic Product (PROBIOTIC PO) Take 1 capsule by mouth daily.    Yes [provider]  rosuvastatin (CRESTOR) 10 MG tablet Take 1 tablet (10 mg total) by mouth daily. 07/20/19  Yes [name], NP  sertraline (ZOLOFT) 100 MG tablet Take 1 tablet (100 mg total) by mouth daily. 07/14/19  Yes Baity, Coralie Keens, NP      VITAL SIGNS:  Blood pressure 135/87, pulse 88, temperature 99 F (37.2 C), temperature source Oral, resp. rate 20, height 5' (1.524 m), weight 104.3 kg, SpO2 99 %.  PHYSICAL EXAMINATION:  GENERAL:  78 y.o.-year-old patient lying in the bed with no acute distress.  EYES: Pupils equal, round, reactive to light and accommodation. No scleral icterus. Extraocular muscles intact.  HEENT: Head atraumatic, normocephalic. Oropharynx and nasopharynx clear.  NECK:  Supple, no jugular venous distention. No thyroid enlargement, no tenderness.  LUNGS:  Normal breath sounds bilaterally, no wheezing, rales,rhonchi or crepitation. No use of accessory muscles of respiration.  CARDIOVASCULAR: S1, S2 normal. No murmurs, rubs, or gallops.  ABDOMEN: Soft, nontender, nondistended. Bowel sounds present.   EXTREMITIES: Right lower extremity edema, tenderness and erythema  NEUROLOGIC: Cranial nerves II through XII are intact. . Sensation intact. Gait not checked.  PSYCHIATRIC: The patient is alert and oriented x 2-3.  SKIN: No obvious rash, lesion, or ulcer.   LABORATORY PANEL:   CBC Recent Labs  Lab 08/15/19 1515  WBC 9.2  HGB 12.8  HCT 41.4  PLT 259   ------------------------------------------------------------------------------------------------------------------  Chemistries  Recent Labs  Lab 08/15/19 1515  NA 141  K 3.8  CL 103  CO2 29  GLUCOSE 150*  BUN 17  CREATININE 0.71  CALCIUM 9.7  AST 20  ALT 15  ALKPHOS 85  BILITOT 0.4   ------------------------------------------------------------------------------------------------------------------  Cardiac Enzymes No results for input(s): TROPONINI in the last 168 hours. ------------------------------------------------------------------------------------------------------------------  RADIOLOGY:  US Venous Img Lower Unilateral Right  Result Date: 08/15/2019 CLINICAL DATA:  78 year old female with a history of right leg swelling for a week EXAM: RIGHT LOWER EXTREMITY VENOUS DOPPLER ULTRASOUND TECHNIQUE: Gray-scale sonography with graded compression, as well as color Doppler and duplex ultrasound were performed to evaluate the lower extremity deep venous systems from the level of the common femoral vein and including the common femoral, femoral, profunda femoral, popliteal and calf veins including the posterior tibial, peroneal and gastrocnemius veins when visible. The superficial great saphenous vein was also interrogated. Spectral Doppler was utilized to evaluate flow at rest and  with distal augmentation maneuvers in the common femoral, femoral and popliteal veins. COMPARISON:  None. FINDINGS: Contralateral Common Femoral Vein: Respiratory phasicity is normal and symmetric with the symptomatic side. No evidence of thrombus. Normal compressibility. DVT involving the common femoral vein, femoral vein, popliteal vein, into the peroneal vein and posterior tibial vein. Thrombus extends into the saphenofemoral junction as well as into the profunda vein. Thrombus at the common femoral vein is partially occlusive. Other Findings:  None. IMPRESSION: Sonographic survey of the right lower extremity positive for acute DVT of the common femoral vein, profunda vein, saphenofemoral junction, and through the length of the femoral vein, popliteal vein, into the  tibial veins. The thrombus at the common femoral vein is partially occlusive, and uncertain to what degree thrombus extends into the pelvis. Electronically Signed   By: Corrie Mckusick D.O.   On: 08/15/2019 15:57    EKG:   Orders placed or performed during the hospital encounter of 01/26/19  . EKG 12 lead  . EKG 12 lead    IMPRESSION AND PLAN:    #Acute, extensive right lower extremity DVT extending into the pelvis-patient is hypercoagulable from the underlying history of breast cancer and recently with less ambulation Admitted telemetry N.p.o. Venous Dopplers have revealed -right lower extremity positive for acute DVT of the common femoral vein, profunda vein, saphenofemoral junction, and through the length of the femoral vein, popliteal vein, into the tibial veins. The thrombus at the common femoral vein is partially occlusive, and uncertain to what degree thrombus extends into the pelvis Start vascular surgery consult placed in seen by vascular surgery who is recommending emergent thrombolysis Pain management as needed Currently on heparin drip  #Diabetes mellitus sliding scale insulin hold home med glipizide  #History of  breast cancer stage II Follow-up with oncology as recommended  #Obstructive sleep apnea CPAP nightly  #Osteoporosis-resume home dose Fosamax once patient is tolerating p.o.  DVT prophylaxis with heparin drip  All the records are reviewed and case discussed with ED provider. Management plans discussed with the patient, daughter at bedside and they are in agreement.  CODE STATUS: fc   TOTAL TIME TAKING CARE OF THIS PATIENT: 50 minutes.   Note: This dictation was prepared with Dragon dictation along with smaller phrase technology. Any transcriptional errors that result from this process are unintentional.  Nicholes Mango M.D on 08/15/2019 at 5:49 PM  Between 7am to 6pm - Pager - [phone]  After 6pm go to [email or web address] - password [email or web address]  Office  [phone]  CC: Primary care physician; [name], NP

## 2019-08-15 NOTE — Transfer of Care (Signed)
Immediate Anesthesia Transfer of Care Note  Patient: Tiffany Arnold  Procedure(s) Performed: PERIPHERAL VASCULAR THROMBECTOMY WITH IVC FILTER (Right )  Patient Location: pacu  Anesthesia Type:General  Level of Consciousness: sedated and patient cooperative  Airway & Oxygen Therapy: Patient connected to nasal cannula oxygen  Post-op Assessment: Report given to RN and Post -op Vital signs reviewed and stable  Post vital signs: Reviewed and stable  Last Vitals:  Vitals Value Taken Time  BP 155/95 08/15/19 2143  Temp    Pulse 95 08/15/19 2149  Resp 36 08/15/19 2149  SpO2 98 % 08/15/19 2149  Vitals shown include unvalidated device data.  Last Pain:  Vitals:   08/15/19 1845  TempSrc:   PainSc: 0-No pain         Complications: No apparent anesthesia complications

## 2019-08-15 NOTE — Anesthesia Post-op Follow-up Note (Signed)
Anesthesia QCDR form completed.        

## 2019-08-15 NOTE — Op Note (Signed)
[facility]                                                                               OPERATIVE NOTE    PRE-OPERATIVE DIAGNOSIS: Right leg DVT;   POST-OPERATIVE DIAGNOSIS: Same; Stricture of right common iliac  PROCEDURE: 1. Ultrasound guidance for vascular access to the right common femoral  2. Catheter placement into the inferior vena cava for placement of IVC filter 3. Inferior venacavogram 4. Placement of a [place] IVC filter infrarenal 5.Mechanical thrombectomy of the right common femoral; right external iliac vein and right common iliac vein 6. Infusion thrombolysis with 10 mg of TPA 7.Percutaneous transluminal angioplasty and stent placement right common iliac vein  SURGEON: [name]  ASSISTANT(S): None  ANESTHESIA: General  ESTIMATED BLOOD LOSS: minimal  Contrast:100  Fluoroscopy time:18.7  FINDING(S): 1. 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 year old female who presents with massive swelling of the right leg quite painful in association with diminished pedal pulses and deep cyanosis of the right foot.  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  Risks and benefits of thrombectomy and intervention including filter complications such as but not limited to thrombosis, migration, fracture, bleeding, and infection were all discussed. We discussed that all IVC filters that we place can be removed if desired from the patient once the need for the filter has passed. The patient agrees to proceed daughter is also in agreement  DESCRIPTION: After obtaining full informed written consent, the  patient was brought back to the vascular suite. The skin was sterilely prepped and draped in a sterile surgical field was created.  Ultrasound was placed in a sterile sleeve.  The right popliteal vein was image with the ultrasound.  It was virtually indistinguishable from the surrounding soft tissues secondary to depth as well as the massive edema.  It was noncompressible.  Color flow did not help [place] localize the artery as a landmark consistent with phlegmasia.  The patient was very uncomfortable in the prone position and moving quite a bit in spite of a fairly decent amount of conscious sedation and therefore I aborted the popliteal approach and requested anesthesia's assistance.  Subsequently the patient was repositioned to the supine and was placed under general anesthesia without incident.  The right common femoral and then scanning more distally the superficial femoral vein was assessed with the ultrasound.  The structures were found to be heterogeneous and noncompressible consistent with the known acute DVT.  The right superficial femoral vein was accessed under direct ultrasound guidance without difficulty with a micropuncture needle and a  microwire was advanced without difficulty.   A microsheath was then inserted and then a J-wire was then placed. The dilator is passed over the wire and the delivery sheath was placed into the inferior vena cava. Inferior venacavogram was performed. This demonstrated a patent IVC with the level of the renal veins at L1-L2. The filter was then deployed into the inferior vena cava at the level of inferior margin of L2 just below the renal veins.  4000 units of heparin had been given while attempting popliteal access an additional 3000 units was given once the filter was placed (later in the case an additional 3000 units was given)  A stiff angle Glidewire was then reintroduced and negotiated through the IVC filter under direct visualization with the assistance of a  K[place] catheter.  The delivery sheath was then upsized to a [place] sheath.   20 cm length infusion catheter is then prepped on the field and 10 mg of TPA is reconstituted 10 cc. This is then laced throughout the common femoral external iliac and common iliac veins. The TPA is then allowed to dwell. Subsequently, the penumbra CAT 12 catheter is engaged in the aspiration mode and aspiration of the common femoral as well as the popliteal and superficial femoral veins is performed multiple passes are made with the volumes recorded in the procedure notes.  Follow-up imaging now demonstrates that the vast majority of the thrombus is been eliminated from the common femoral and external as well as common iliac veins. 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   This lesion is then predilated with a 12 mm x 40 mm ultra versed balloon.  Magnified imaging is then taken and a 16 mm x 60 mm via Novo stent is deployed across the stricture and postdilated with a 14 mm balloon.  I subsequently re-angioplastied this lesion with a 12 mm balloon to high pressure.  The secondary inflation was to 14 atm for approximately 30 seconds.  Follow-up imaging now demonstrated thrombus within the IVC as well as a large amount of thrombus within the filter.  The cat 12 device was then reintroduced and negotiated into the proximal portion of the filter and then using the cat 12 with the separator the inferior vena cava up to the level of filter was cleared of thrombus.  Follow-up imaging demonstrated a small amount of residual thrombus within the filter without obstruction of flow.  Otherwise the stent and the distal inferior vena cava are widely patent.  Given this finding I elected to terminate the procedure and reinitiate anticoagulation. The wires removed the sheath is removed pressures held and a pressure dressing with Coban and is then applied. The patient is then returned to the supine  position  Interpretation: Initial images demonstrated normal vena cava and filter is placed without difficulty. Initial images the right lower extremity then demonstrated extensive thrombus throughout the popliteal and femoral veins and the iliac veins as well. Following intervention described above there is near-total resolution of thrombus throughout.  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  Initial images after angioplasty demonstrated thrombus within the IVC and this is immediately cleared with near total resolution of the thrombus using the CAT 12.  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

## 2019-08-15 NOTE — Anesthesia Preprocedure Evaluation (Addendum)
Anesthesia Evaluation  Patient identified by MRN, date of birth, ID band Patient awake    Reviewed: Allergy & Precautions, H&P , NPO status , Patient's Chart, lab work & pertinent test results  Airway Mallampati: III  TM Distance: <3 FB Neck ROM: limited    Dental  (+) Chipped, Poor Dentition   Pulmonary asthma , sleep apnea , former smoker,           Cardiovascular Exercise Tolerance: Good hypertension, (-) angina(-) Past MI      Neuro/Psych PSYCHIATRIC DISORDERS negative neurological ROS  negative psych ROS   GI/Hepatic Neg liver ROS, GERD  ,  Endo/Other  diabetes, Type 2  Renal/GU Renal disease     Musculoskeletal  (+) Arthritis ,   Abdominal   Peds  Hematology negative hematology ROS (+)   Anesthesia Other Findings Past Medical History: No date: Allergic rhinitis, seasonal No date: Arm bruise     Comment:  left arm down to hand from fall 01-05-2019 No date: Bilateral renal cysts     Comment:  simple (monitored by dr Karsten Ro) No date: Chronic cystitis     Comment:  urologist-- dr Karsten Ro No date: GAD (generalized anxiety disorder) 09-09-2016  to 11-12-2016: History of cancer chemotherapy     Comment:  breast cancer 12-24-2016  to 02-03-2017: History of external beam radiation therapy     Comment:  right breast No date: History of recurrent UTIs 01/05/2019: Humerus fracture     Comment:  left ,  hx total shoulder arthroplasty  No date: Hyperlipidemia No date: Hypertension No date: IBS (irritable bowel syndrome)     Comment:  mixed No date: Lymphedema of right upper extremity No date: Malignant neoplasm of lower-inner quadrant of right breast  of female, estrogen receptor positive ([place])     Comment:  ONCOLOGIST-- dr Burr Medico;   dx 06/ 2017,  Stage IIB (pT1c N1              cM0), Grade 1-2,  DCIS,  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/                07-29-2016  s/p  right lumpectomy w/ sln dissections (1                out of 16 positve),   completed chemo 11-12-2016,                completed radiation 02-03-2017,  started anti-estrogen               therapy 02-17-2017 06/22/2017: Memory disorder     Comment:  with intermittant confusion;  neurologist-- dr Krista Blue (note              in epic) No date: Mild persistent asthma     Comment:  no inhaler No date: OA (osteoarthritis) No date: OSA on CPAP No date: Type 2 diabetes, diet controlled ([place]) No date: Urge incontinence of urine No date: Wears glasses  Past Surgical History: 2001: BREAST CYST EXCISION; Right     Comment:  negative 07/29/2016: BREAST LUMPECTOMY WITH NEEDLE LOCALIZATION AND AXILLARY  LYMPH NODE DISSECTION; Right     Comment:  Procedure: RIGHT BREAST LUMPECTOMY WITH DOUBLE NEEDLE               LOCALIZATION AND COMPLETE RIGHT AXILLARY LYMPH NODE               DISSECTION;  Surgeon: Fanny Skates, MD;  Location: [facility]  OR;  Service: General;  Laterality: Right; 2000: BREAST SURGERY; Left     Comment:  Biopsy 1998: BUNIONECTOMY; Bilateral     Comment:  great toe fusion on right foot right,  fall 2019;  left 2017: CATARACT EXTRACTION W/ INTRAOCULAR  LENS  IMPLANT, BILATERAL No date: COLONOSCOPY W/ POLYPECTOMY 1970s: LUMBAR SPINE SURGERY 2015: NASAL SINUS SURGERY 01/27/2019: ORIF HUMERUS FRACTURE; Left     Comment:  Procedure: OPEN REDUCTION INTERNAL FIXATION (ORIF) LEFT               HUMERUS;  Surgeon: Justice Britain, MD;  Location: [facility] ORS;               Service: Orthopedics;  Laterality: Left; 09/05/2016: PORTACATH PLACEMENT; N/A     Comment:  Procedure: INSERTION PORT-A-CATH;  Surgeon: Fanny Skates, MD;  Location: [facility] ORS;  Service: General;                Laterality: N/A; 2007: TOTAL KNEE ARTHROPLASTY; Bilateral 2011: TOTAL SHOULDER REPLACEMENT; Left 04-06-2014   @[facility] : UMBILICAL HERNIA REPAIR  BMI    Body Mass Index: 44.92 kg/m      Reproductive/Obstetrics negative OB ROS                              Anesthesia Physical Anesthesia Plan  ASA: V and emergent  Anesthesia Plan: General ETT and Rapid Sequence   Post-op Pain Management:    Induction: Intravenous  PONV Risk Score and Plan: Ondansetron, Dexamethasone, Midazolam and Treatment may vary due to age or medical condition  Airway Management Planned: Oral ETT and Video Laryngoscope Planned  Additional Equipment:   Intra-op Plan:   Post-operative Plan: Extubation in OR  Informed Consent: I have reviewed the patients History and Physical, chart, labs and discussed the procedure including the risks, benefits and alternatives for the proposed anesthesia with the patient or authorized representative who has indicated his/her understanding and acceptance.     Dental Advisory Given  Plan Discussed with: Anesthesiologist, CRNA and Surgeon  Anesthesia Plan Comments: (Anesthesia team called emergently to the interventional radiology procedure room.  Patient not tolerating procedure under moderate sedation.  I spoke with the patient who is A&Ox3 and reviewed her medical history with her.  I also reviewed the patients medical history with the patients daughter in the procedure waiting room.  Due to the emergent nature of the procedure the patient was consented in the procedure room and the patients daughter was consented in the waiting area.  They were consented for risks of anesthesia including but not limited to:  - adverse reactions to medications - damage to teeth, lips or other oral mucosa - sore throat or hoarseness - Damage to heart, brain, lungs or loss of life  They voiced understanding.)       Anesthesia Quick Evaluation

## 2019-08-15 NOTE — Consult Note (Signed)
ANTICOAGULATION CONSULT NOTE - Initial Consult  Pharmacy Consult for heparin drip Indication: DVT  Allergies  Allergen Reactions  . Other Shortness Of Breath, Swelling and Other (See Comments)    Cats, dogs, mold Induced asthma Cats, dogs, mold    Patient Measurements: Height: 5' (152.4 cm) Weight: 230 lb (104.3 kg) IBW/kg (Calculated) : 45.5 Heparin Dosing Weight: 71.7 kg  Vital Signs: Temp: 99 F (37.2 C) (08/24 1502) Temp Source: Oral (08/24 1502) BP: 114/83 (08/24 1530) Pulse Rate: 94 (08/24 1530)  Labs: Recent Labs    08/15/19 1515  HGB 12.8  HCT 41.4  PLT 259  APTT 28  LABPROT 13.2  INR 1.0  CREATININE 0.71    Estimated Creatinine Clearance: 66.2 mL/min (by C-G formula based on SCr of 0.71 mg/dL).   Medical History: Past Medical History:  Diagnosis Date  . Allergic rhinitis, seasonal   . Arm bruise    left arm down to hand from fall 01-05-2019  . Bilateral renal cysts    simple (monitored by dr Karsten Ro)  . Chronic cystitis    urologist-- dr Karsten Ro  . GAD (generalized anxiety disorder)   . History of cancer chemotherapy 09-09-2016  to 11-12-2016   breast cancer  . History of external beam radiation therapy 12-24-2016  to 02-03-2017   right breast  . History of recurrent UTIs   . Humerus fracture 01/05/2019   left ,  hx total shoulder arthroplasty   . Hyperlipidemia   . Hypertension   . IBS (irritable bowel syndrome)    mixed  . Lymphedema of right upper extremity   . 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 Memory disorder 06/22/2017   with intermittant confusion;  neurologist-- dr Krista Blue (note in  epic)  . Mild persistent asthma    no inhaler  . OA (osteoarthritis)   . OSA on CPAP   . Type 2 diabetes, diet controlled ([place])   . Urge incontinence of urine   . Wears glasses     Medications:  (Not in a hospital admission)  Scheduled:  . heparin  4,500 Units Intravenous Once   Infusions:  . heparin     PRN:  Anti-infectives (From admission, onward)   None      Assessment: Pharmacy has been consulted to initiate heparin drip on 78 yo patient complaining of significant leg swelling, pain and redness. Patient has no history of prior anticoagulant use. Baseline labs have been ordered and are pending.   Goal of Therapy:  Heparin level 0.3-0.7 units/ml Monitor platelets by anticoagulation protocol: Yes   Plan:  Give 4500 units bolus x 1 Start heparin infusion at 1250 units/hr Check anti-Xa level in 8 hours and daily while on heparin Continue to monitor H&H and platelets  Asher Torpey A Sahil Milner 08/15/2019,4:10 PM

## 2019-08-15 NOTE — Anesthesia Procedure Notes (Signed)
Procedure Name: Intubation Date/Time: 08/15/2019 7:32 PM Performed by: Andria Frames, MD Pre-anesthesia Checklist: Patient identified, Patient being monitored, Timeout performed, Emergency Drugs available and Suction available Patient Re-evaluated:Patient Re-evaluated prior to induction Oxygen Delivery Method: Circle system utilized Preoxygenation: Pre-oxygenation with 100% oxygen Induction Type: IV induction and Rapid sequence Laryngoscope Size: 3 and McGraph Grade View: Grade I Tube type: Oral Tube size: 7.0 mm Number of attempts: 1 Airway Equipment and Method: Stylet Placement Confirmation: ETT inserted through vocal cords under direct vision,  positive ETCO2 and breath sounds checked- equal and bilateral Secured at: 20 cm Tube secured with: Tape Dental Injury: Teeth and Oropharynx as per pre-operative assessment

## 2019-08-15 NOTE — Progress Notes (Signed)
Family Meeting Note  Advance Directive:yes  Today a meeting took place with the Patient daughter at bedside   The following clinical team members were present during this meeting:MD  The following were discussed:Patient's diagnosis: Acute, extensive right lower extremity DVT and unsure to what extent it is extending into the pelvis, history of breast cancer stage II, hypertension, diabetes mellitus, osteoporosis, obstructive sleep apnea will be admitted to the hospital and stat vascular surgery consult placed, patient is scheduled for for stat thrombectomy The plan of care discussed in detail with the patient and her daughter at bedside.  They both verbalized understanding of the plan.    Patient's progosis: Unable to determine and Goals for treatment: Full Code  Daughter [name] and sister [name] are the healthcare power of attorney  Additional follow-up to be provided: Hospitalist, vascular surgery  Time spent during discussion:17 min  Nicholes Mango, MD

## 2019-08-15 NOTE — Consult Note (Signed)
[facility] Vascular Consult Note  MRN : [id number]  Tiffany Arnold is a 78 y.o. ([birth year]) female who presents with chief complaint of  Chief Complaint  Patient presents with  . Leg Swelling   History of Present Illness:  The patient is a 78 year old female with multiple medical issues (see below) who presented to the [facility] with a chief complaint of right lower extremity swelling and pain.  Patient seen with daughter at bedside.  Patient endorses a history of progressively worsening right lower extremity pain, swelling and redness.  She is unable to quantify a specific time however states it is been occurring over the "past several days".  The patient's home health nurse after examining her right lower extremity is a one who strongly encouraged her to seek medical attention in the emergency department fearing a DVT.  Patient denies any recent surgery or trauma, prolonged illness or travel, or family clotting disorder. The patient denies any shortness of breath or chest pain.  Patient denies any fever, nausea vomiting.  Right lower extremity venous duplex (08/15/19): 1) Sonographic survey of the right lower extremity positive for acute DVT of the common femoral vein, profunda vein, saphenofemoral junction, and through the length of the femoral vein, popliteal vein, into the tibial veins. 2) The thrombus at the common femoral vein is partially occlusive, and uncertain to what degree thrombus extends into the pelvis.  Vascular surgery was consulted by Dr. Jimmye Norman for further recommendations Current Facility-Administered Medications  Medication Dose Route Frequency Provider Last Rate Last Dose  . 0.9 %  sodium chloride infusion   Intravenous Continuous ,  A, PA-C      . heparin ADULT infusion 100 units/mL (25000 units/241m sodium chloride 0.45%)  1,250 Units/hr Intravenous Continuous NRito EhrlichA, RPH 12.5 mL/hr at  08/15/19 1632 1,250 Units/hr at 08/15/19 1632   Current Outpatient Medications  Medication Sig Dispense Refill  . acetaminophen (TYLENOL) 500 MG tablet Take 1,000 mg by mouth every 6 (six) hours as needed for moderate pain or headache.     . alendronate (FOSAMAX) 70 MG tablet TAKE 1 TABLET EVERY 7 DAYS WITH A FULL GLASS OF WATER ON AN EMPTY STOMACH DO NOT LIE DOWN FOR AT LEAST 30 MIN (Patient taking differently: Take 70 mg by mouth every Tuesday. ) 4 tablet 5  . cetirizine (ZYRTEC) 10 MG tablet Take 10 mg by mouth every evening.     . Cholecalciferol (VITAMIN D-3) 125 MCG (5000 UT) TABS Take 5,000 Units by mouth daily.    .Marland Kitchenexemestane (AROMASIN) 25 MG tablet Take 1 tablet (25 mg total) by mouth daily. 30 tablet 6  . ferrous sulfate 325 (65 FE) MG tablet Take 325 mg by mouth daily with breakfast.    . glipiZIDE (GLUCOTROL) 5 MG tablet Take 1 tablet (5 mg total) by mouth daily before breakfast. 30 tablet 2  . lisinopril (ZESTRIL) 20 MG tablet Take 1 tablet (20 mg total) by mouth daily. 90 tablet 3  . memantine (NAMENDA) 10 MG tablet TAKE ONE TABLET TWICE DAILY 60 tablet 5  . nitrofurantoin, macrocrystal-monohydrate, (MACROBID) 100 MG capsule Take 1 capsule (100 mg total) by mouth 2 (two) times daily. (Patient taking differently: Take 100 mg by mouth every evening. ) 10 capsule 0  . Probiotic Product (PROBIOTIC PO) Take 1 capsule by mouth daily.     . rosuvastatin (CRESTOR) 10 MG tablet Take 1 tablet (10 mg total) by mouth daily. 30 tablet 2  .  sertraline (ZOLOFT) 100 MG tablet Take 1 tablet (100 mg total) by mouth daily. 90 tablet 3   Past Medical History:  Diagnosis Date  . Allergic rhinitis, seasonal   . Arm bruise    left arm down to hand from fall 01-05-2019  . Bilateral renal cysts    simple (monitored by dr Karsten Ro)  . Chronic cystitis    urologist-- dr Karsten Ro  . GAD (generalized anxiety disorder)   . History of cancer chemotherapy 09-09-2016  to 11-12-2016   breast cancer  .  History of external beam radiation therapy 12-24-2016  to 02-03-2017   right breast  . History of recurrent UTIs   . Humerus fracture 01/05/2019   left ,  hx total shoulder arthroplasty   . Hyperlipidemia   . Hypertension   . IBS (irritable bowel syndrome)    mixed  . Lymphedema of right upper extremity   . 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 Memory disorder 06/22/2017   with intermittant confusion;  neurologist-- dr Krista Blue (note in epic)  . Mild persistent asthma    no inhaler  . OA (osteoarthritis)   . OSA on CPAP   . Type 2 diabetes, diet controlled ([place])   . Urge incontinence of urine   . Wears glasses    Past Surgical History:  Procedure Laterality Date  . BREAST CYST EXCISION Right 2001   negative  . BREAST LUMPECTOMY WITH NEEDLE LOCALIZATION AND AXILLARY LYMPH NODE DISSECTION Right 07/29/2016   Procedure: RIGHT BREAST LUMPECTOMY WITH DOUBLE NEEDLE LOCALIZATION AND COMPLETE RIGHT AXILLARY LYMPH NODE DISSECTION;  Surgeon: Fanny Skates, MD;  Location: [place];  Service: General;  Laterality: Right;  . BREAST SURGERY Left 2000   Biopsy  . BUNIONECTOMY Bilateral 1998   great toe fusion on right foot  . CATARACT EXTRACTION W/ INTRAOCULAR LENS  IMPLANT, BILATERAL  right,  fall 2019;  left 2017  . COLONOSCOPY W/ POLYPECTOMY    . LUMBAR SPINE SURGERY  1970s  . NASAL SINUS SURGERY  2015  . ORIF HUMERUS FRACTURE Left 01/27/2019   Procedure: OPEN REDUCTION INTERNAL FIXATION (ORIF) LEFT HUMERUS;  Surgeon: Justice Britain, MD;  Location: [facility];  Service: Orthopedics;  Laterality: Left;  . PORTACATH PLACEMENT N/A 09/05/2016   Procedure:  INSERTION PORT-A-CATH;  Surgeon: Fanny Skates, MD;  Location: [facility];  Service: General;  Laterality: N/A;  . TOTAL KNEE ARTHROPLASTY Bilateral 2007  . TOTAL SHOULDER REPLACEMENT Left 2011  . UMBILICAL HERNIA REPAIR  04-06-2014   @[facility]    Social History Social History   Tobacco Use  . Smoking status: Former Smoker    Packs/day: 2.00    Years: 25.00    Pack years: 50.00    Quit date: 12/23/1991    Years since quitting: 27.6  . Smokeless tobacco: Never Used  Substance Use Topics  . Alcohol use: Not Currently  . Drug use: No   Family History Family History  Problem Relation Age of Onset  . Arthritis Mother   . Stroke Mother   . Hypertension Mother   . Cancer Father        Prostate  . Stroke  Father   . Hypertension Father   . Hypertension Maternal Grandmother   . Rheum arthritis Maternal Grandfather   . Stroke Maternal Grandfather   . Hypertension Maternal Grandfather   . Cancer Paternal Grandmother        Colon  . Hypertension Paternal Grandmother   . Hypertension Paternal Grandfather   . Breast cancer Neg Hx   Denies family history of peripheral artery disease, venous disease or clotting disorder.  Allergies  Allergen Reactions  . Other Shortness Of Breath, Swelling and Other (See Comments)    Cats, dogs, mold Induced asthma Cats, dogs, mold   REVIEW OF SYSTEMS (Negative unless checked)  Constitutional: [] Weight loss  [] Fever  [] Chills Cardiac: [] Chest pain   [] Chest pressure   [] Palpitations   [] Shortness of breath when laying flat   [] Shortness of breath at rest   [] Shortness of breath with exertion. Vascular:  [x] Pain in legs with walking   [x] Pain in legs at rest   [x] Pain in legs when laying flat   [] Claudication   [] Pain in feet when walking  [] Pain in feet at rest  [] Pain in feet when laying flat   [] History of DVT   [] Phlebitis   [x] Swelling in legs   [] Varicose veins   [] Non-healing ulcers Pulmonary:   [] Uses home oxygen   [] Productive cough   [] Hemoptysis    [] Wheeze  [] COPD   [] Asthma Neurologic:  [] Dizziness  [] Blackouts   [] Seizures   [] History of stroke   [] History of TIA  [] Aphasia   [] Temporary blindness   [] Dysphagia   [] Weakness or numbness in arms   [] Weakness or numbness in legs Musculoskeletal:  [] Arthritis   [] Joint swelling   [] Joint pain   [] Low back pain Hematologic:  [] Easy bruising  [] Easy bleeding   [] Hypercoagulable state   [] Anemic  [] Hepatitis Gastrointestinal:  [] Blood in stool   [] Vomiting blood  [] Gastroesophageal reflux/heartburn   [] Difficulty swallowing. Genitourinary:  [] Chronic kidney disease   [] Difficult urination  [] Frequent urination  [] Burning with urination   [] Blood in urine Skin:  [] Rashes   [] Ulcers   [] Wounds Psychological:  [] History of anxiety   []  History of major depression.  Physical Examination  Vitals:   08/15/19 1500 08/15/19 1502 08/15/19 1503 08/15/19 1530  BP: (!) 167/75 (!) 167/75  114/83  Pulse: 92 90  94  Resp: 17 17  19   Temp:  99 F (37.2 C)    TempSrc:  Oral    SpO2: 99% 99%  94%  Weight:   104.3 kg   Height:   5' (1.524 m)    Body mass index is 44.92 kg/m. Gen:  WD/WN, NAD Head: [place]/AT, No temporalis wasting. Prominent temp pulse not noted. Ear/Nose/Throat: Hearing grossly intact, nares w/o erythema or drainage, oropharynx w/o Erythema/Exudate Eyes: Sclera non-icteric, conjunctiva clear Neck: Trachea midline.  No JVD.  Pulmonary:  Good air movement, respirations not labored, equal bilaterally.  Cardiac: RRR, normal S1, S2. Vascular:  Vessel Right Left  Radial Palpable Palpable  Ulnar Palpable Palpable  Brachial Palpable Palpable  Carotid Palpable, without bruit Palpable, without bruit  Aorta Not palpable N/A  Femoral Palpable Palpable  Popliteal Non-Palpable Palpable  PT Non-Palpable Palpable  DP Non-Palpable Palpable   Right lower extremity: Thigh and calf are tight with moderate to severe edema.  The extremity is erythematous transitioning to a bluish mottled color at  the ankle distally to the toes.  Unable to palpate pedal pulses.  Foot is cold.  Motor/sensory is intact.  Gastrointestinal:  soft, non-tender/non-distended. No guarding/reflex.  Musculoskeletal: M/S 5/5 throughout.  Neurologic: Sensation grossly intact in extremities.  Symmetrical.  Speech is fluent. Motor exam as listed above. Psychiatric: Judgment intact, Mood & affect appropriate for pt's clinical situation. Dermatologic: No rashes or ulcers noted.   Lymph : No Cervical, Axillary, or Inguinal lymphadenopathy.  CBC Lab Results  Component Value Date   WBC 9.2 08/15/2019   HGB 12.8 08/15/2019   HCT 41.4 08/15/2019   MCV 92.6 08/15/2019   PLT 259 08/15/2019   BMET    Component Value Date/Time   NA 141 08/15/2019 1515   NA 140 09/22/2017 1003   K 3.8 08/15/2019 1515   K 3.8 09/22/2017 1003   CL 103 08/15/2019 1515   CO2 29 08/15/2019 1515   CO2 29 09/22/2017 1003   GLUCOSE 150 (H) 08/15/2019 1515   GLUCOSE 116 09/22/2017 1003   BUN 17 08/15/2019 1515   BUN 22.4 09/22/2017 1003   CREATININE 0.71 08/15/2019 1515   CREATININE 0.71 07/02/2018 1514   CREATININE 0.8 09/22/2017 1003   CALCIUM 9.7 08/15/2019 1515   CALCIUM 10.0 09/22/2017 1003   GFRNONAA >60 08/15/2019 1515   GFRAA >60 08/15/2019 1515   Estimated Creatinine Clearance: 66.2 mL/min (by C-G formula based on SCr of 0.71 mg/dL).  COAG Lab Results  Component Value Date   INR 1.0 08/15/2019   INR 1.02 07/08/2017   INR 1.13 04/28/2017   Radiology US Venous Img Lower Unilateral Right  Result Date: 08/15/2019 CLINICAL DATA:  78 year old female with a history of right leg swelling for a week EXAM: RIGHT LOWER EXTREMITY VENOUS DOPPLER ULTRASOUND TECHNIQUE: Gray-scale sonography with graded compression, as well as color Doppler and duplex ultrasound were performed to evaluate the lower extremity deep venous systems from the level of the common femoral vein and including the common femoral, femoral, profunda femoral,  popliteal and calf veins including the posterior tibial, peroneal and gastrocnemius veins when visible. The superficial great saphenous vein was also interrogated. Spectral Doppler was utilized to evaluate flow at rest and with distal augmentation maneuvers in the common femoral, femoral and popliteal veins. COMPARISON:  None. FINDINGS: Contralateral Common Femoral Vein: Respiratory phasicity is normal and symmetric with the symptomatic side. No evidence of thrombus. Normal compressibility. DVT involving the common femoral vein, femoral vein, popliteal vein, into the peroneal vein and posterior tibial vein. Thrombus extends into the saphenofemoral junction as well as into the profunda vein. Thrombus at the common femoral vein is partially occlusive. Other Findings:  None. IMPRESSION: Sonographic survey of the right lower extremity positive for acute DVT of the common femoral vein, profunda vein, saphenofemoral junction, and through the length of the femoral vein, popliteal vein, into the tibial veins. The thrombus at the common femoral vein is partially occlusive, and uncertain to what degree thrombus extends into the pelvis. Electronically Signed   By: Corrie Mckusick D.O.   On: 08/15/2019 15:57   Assessment/Plan The patient is a 78 year old female with multiple medical issues (see below) who presented to the [facility] with a chief complaint of right lower extremity swelling and pain. 1.  Right lower extremity DVT: Patient presents with progressively worsening right lower extremity pain.  Found to have an extensive right lower extremity DVT extending the entire length of the leg possibly extending into the pelvis on ultrasound.  Patient's physical exam is extremely concerning for phlegmasia.  Recommend an emergent right lower extremity venous lysis with IVC filter placement and attempt for limb  salvage.  Procedure, risks and benefits explained to the patient and family member at the  bedside.  All questions were answered.  The patient would like to proceed. 2.  Hyperlipidemia: On statin.  Would consider the addition of an aspirin for medical management. Encouraged good control as its slows the progression of atherosclerotic disease 3.  Diabetes: On appropriate medications. Encouraged good control as its slows the progression of atherosclerotic disease  Discussed with Dr. Francene Castle, PA-C  08/15/2019 4:40 PM  This note was created with Dragon medical transcription system.  Any error is purely unintentional

## 2019-08-15 NOTE — ED Provider Notes (Addendum)
[facility] Emergency Department Provider Note       Time seen: ----------------------------------------- 2:55 PM on 08/15/2019 -----------------------------------------  I have reviewed the triage vital signs and the nursing notes.  HISTORY   Chief Complaint No chief complaint on file.   HPI Tiffany Arnold is a 78 y.o. female with a history of chronic cystitis, hyperlipidemia, hypertension, IBS, lymphedema, memory disorder who presents to the ED for right leg pain and swelling.  Patient arrives from home by EMS.  She has pain and redness to the entire right leg.  Home health nurse was concerned that she may have a DVT.  Patient is unsure how long this is been going on, states it has been several days but does have some memory disturbance and dementia chronically.  She denies any chest pain or difficulty breathing.  Past Medical History:  Diagnosis Date  . Allergic rhinitis, seasonal   . Arm bruise    left arm down to hand from fall 01-05-2019  . Bilateral renal cysts    simple (monitored by dr Karsten Ro)  . Chronic cystitis    urologist-- dr Karsten Ro  . GAD (generalized anxiety disorder)   . History of cancer chemotherapy 09-09-2016  to 11-12-2016   breast cancer  . History of external beam radiation therapy 12-24-2016  to 02-03-2017   right breast  . History of recurrent UTIs   . Humerus fracture 01/05/2019   left ,  hx total shoulder arthroplasty   . Hyperlipidemia   . Hypertension   . IBS (irritable bowel syndrome)    mixed  . Lymphedema of right upper extremity   . 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 Memory disorder 06/22/2017   with intermittant confusion;  neurologist-- dr Krista Blue (note in epic)  . Mild persistent asthma    no inhaler  . OA (osteoarthritis)   . OSA on CPAP   . Type 2 diabetes, diet controlled ([place])   . Urge incontinence of urine   . Wears glasses     Patient Active Problem List   Diagnosis Date Noted  . Iron deficiency anemia 07/08/2018  . Recurrent UTI 01/01/2018  . Memory disorder 06/22/2017  . Osteoporosis 04/01/2017  . Port catheter in place 12/25/2016  . IBS (irritable bowel syndrome) 11/25/2016  . Breast cancer of lower-inner quadrant of right female breast ([place]) 06/27/2016  . OSA on CPAP 06/26/2016  . Type 2 diabetes mellitus without complication ([place]) 59/16/3846  . Essential hypertension 09/04/2014  . HLD (hyperlipidemia) 09/04/2014  . Gastroesophageal reflux disease without esophagitis 09/04/2014  . Seasonal allergies 09/04/2014  . Generalized anxiety disorder 09/04/2014    Past Surgical History:  Procedure Laterality Date  . BREAST CYST EXCISION Right 2001   negative  . BREAST LUMPECTOMY WITH NEEDLE LOCALIZATION AND AXILLARY LYMPH NODE DISSECTION Right 07/29/2016   Procedure: RIGHT BREAST LUMPECTOMY WITH DOUBLE NEEDLE LOCALIZATION AND COMPLETE RIGHT AXILLARY LYMPH NODE DISSECTION;  Surgeon: Fanny Skates, MD;  Location: [place];  Service: General;  Laterality: Right;  . BREAST SURGERY Left 2000   Biopsy  . BUNIONECTOMY Bilateral 1998   great toe fusion on right  foot  . CATARACT EXTRACTION W/ INTRAOCULAR LENS  IMPLANT, BILATERAL  right,  fall 2019;  left 2017  . COLONOSCOPY W/ POLYPECTOMY    . LUMBAR SPINE SURGERY  1970s  . NASAL SINUS SURGERY  2015  . ORIF HUMERUS FRACTURE Left 01/27/2019   Procedure: OPEN REDUCTION INTERNAL FIXATION (ORIF) LEFT HUMERUS;  Surgeon: Justice Britain, MD;  [facility];  Service: Orthopedics;  Laterality: Left;  . PORTACATH PLACEMENT N/A 09/05/2016   Procedure: INSERTION PORT-A-CATH;  Surgeon: Fanny Skates, MD;  [facility];  Service: General;  Laterality: N/A;  . TOTAL KNEE ARTHROPLASTY Bilateral 2007  . TOTAL SHOULDER REPLACEMENT Left 2011  . UMBILICAL HERNIA REPAIR  04-06-2014   @[facility]     Allergies Other  Social History Social History   Tobacco Use  . Smoking status: Former Smoker    Packs/day: 2.00    Years: 25.00    Pack years: 50.00    Quit date: 12/23/1991    Years since quitting: 27.6  . Smokeless tobacco: Never Used  Substance Use Topics  . Alcohol use: Not Currently  . Drug use: No   Review of Systems Constitutional: Negative for fever. Cardiovascular: Negative for chest pain. Respiratory: Negative for shortness of breath. Gastrointestinal: Negative for abdominal pain, vomiting and diarrhea. Musculoskeletal: Positive for right leg pain, swelling and redness Skin: Positive for right leg erythema Neurological: Negative for headaches, focal weakness or numbness.  All systems negative/normal/unremarkable except as stated in the HPI  ____________________________________________   PHYSICAL EXAM:  VITAL SIGNS: ED Triage Vitals  Enc Vitals Group     BP      Pulse      Resp      Temp      Temp src      SpO2      Weight      Height      Head Circumference      Peak Flow      Pain Score      Pain Loc      Pain Edu?      Excl. in [place]?    Constitutional:  Well appearing and in no distress. Eyes: Conjunctivae are normal. Normal extraocular movements. ENT      Head: Normocephalic and atraumatic.      Nose: No congestion/rhinnorhea.      Mouth/Throat: Mucous membranes are moist.      Neck: No stridor. Cardiovascular: Normal rate, regular rhythm. No murmurs, rubs, or gallops.  Good pulses are palpated in the right lower extremity Respiratory: Normal respiratory effort without tachypnea nor retractions. Breath sounds are clear and equal bilaterally. No wheezes/rales/rhonchi. Gastrointestinal: Soft and nontender. Normal bowel sounds Musculoskeletal:  Extensive edema and erythema of the right lower extremity diffusely from toes to groin, appears circumferential, pitting edema extends to the groin. Neurologic:  Normal speech and language. No gross focal neurologic deficits are appreciated.  Skin: Extensive right lower extremity erythema from her toes to her groin diffusely Psychiatric: Mood and affect are normal. Speech and behavior are normal.  ____________________________________________  ED COURSE:  As part of my medical decision making, I reviewed the following data within the [place] History obtained from family if available, nursing notes, old chart and ekg, as well as notes from prior ED visits. Patient presented for diffuse right lower extremity swelling and edema, we will assess with labs and imaging as indicated at this time.   Procedures  Tiffany Arnold was evaluated in Emergency Department on  08/15/2019 for the symptoms described in the history of present illness. She was evaluated in the context of the global COVID-19 pandemic, which necessitated consideration that the patient might be at risk for infection with the SARS-CoV-2 virus that causes COVID-19. Institutional protocols and algorithms that pertain to the evaluation of patients at risk for COVID-19 are in a state of rapid change based on information released by regulatory bodies including the CDC and federal and state organizations. These policies and algorithms were followed during the patient's care in the ED.  ____________________________________________   LABS (pertinent positives/negatives)  Labs Reviewed  COMPREHENSIVE METABOLIC PANEL - Abnormal; Notable for the following components:      Result Value   Glucose, Bld 150 (*)    All other components within normal limits  CBC WITH DIFFERENTIAL/PLATELET  PROTIME-INR  APTT   CRITICAL CARE Performed by: Laurence Aly   Total critical care time: 30 minutes  Critical care time was  exclusive of separately billable procedures and treating other patients.  Critical care was necessary to treat or prevent imminent or life-threatening deterioration.  Critical care was time spent personally by me on the following activities: development of treatment plan with patient and/or surrogate as well as nursing, discussions with consultants, evaluation of patient's response to treatment, examination of patient, obtaining history from patient or surrogate, ordering and performing treatments and interventions, ordering and review of laboratory studies, ordering and review of radiographic studies, pulse oximetry and re-evaluation of patient's condition.  RADIOLOGY Images were viewed by me  Right lower extremity ultrasound Reveals diffuse thrombosis of the right lower extremity IMPRESSION:  Sonographic survey of the right lower extremity positive for acute  DVT of the common femoral vein, profunda vein, saphenofemoral  junction, and through the length of the femoral vein, popliteal  vein, into the tibial veins.   The thrombus at the common femoral vein is partially occlusive, and  uncertain to what degree thrombus extends into the pelvis.  ____________________________________________   DIFFERENTIAL DIAGNOSIS   DVT, cellulitis, phlegmasia  FINAL ASSESSMENT AND PLAN  Phlegmasia cerulea dolens   Plan: The patient had presented for right lower extremity swelling and redness. Patient's labs were unremarkable. Patient's imaging was concerning for severe and diffuse DVT of the right lower extremity consistent with phlegmasia.  I will discuss with vascular surgery and place her on heparin.   Laurence Aly, MD    Note: This note was generated in part or whole with voice recognition software. Voice recognition is usually quite accurate but there are transcription errors that can and very often do occur. I apologize for any typographical errors that were not detected and  corrected.     Earleen Newport, MD 08/15/19 1557    Earleen Newport, MD 08/15/19 [phone]

## 2019-08-16 LAB — COMPREHENSIVE METABOLIC PANEL
ALT: 13 U/L (ref 0–44)
AST: 18 U/L (ref 15–41)
Albumin: 3 g/dL — ABNORMAL LOW (ref 3.5–5.0)
Alkaline Phosphatase: 70 U/L (ref 38–126)
Anion gap: 10 (ref 5–15)
BUN: 16 mg/dL (ref 8–23)
CO2: 24 mmol/L (ref 22–32)
Calcium: 8.9 mg/dL (ref 8.9–10.3)
Chloride: 108 mmol/L (ref 98–111)
Creatinine, Ser: 0.62 mg/dL (ref 0.44–1.00)
GFR calc Af Amer: >60 mL/min (ref >60)
GFR calc non Af Amer: >60 mL/min (ref >60)
Glucose, Bld: 147 mg/dL — ABNORMAL HIGH (ref 70–99)
Potassium: 3.5 mmol/L (ref 3.5–5.1)
Sodium: 142 mmol/L (ref 135–145)
Total Bilirubin: 0.7 mg/dL (ref 0.3–1.2)
Total Protein: 6.6 g/dL (ref 6.5–8.1)

## 2019-08-16 LAB — CBC
HCT: 37 % (ref 36.0–46.0)
Hemoglobin: 10.9 g/dL — ABNORMAL LOW (ref 12.0–15.0)
MCH: 28.5 pg (ref 26.0–34.0)
MCHC: 29.5 g/dL — ABNORMAL LOW (ref 30.0–36.0)
MCV: 96.9 fL (ref 80.0–100.0)
Platelets: 249 10*3/uL (ref 150–400)
RBC: 3.82 MIL/uL — ABNORMAL LOW (ref 3.87–5.11)
RDW: 13.9 % (ref 11.5–15.5)
WBC: 10.1 10*3/uL (ref 4.0–10.5)
nRBC: 0 % (ref 0.0–0.2)

## 2019-08-16 LAB — GLUCOSE, CAPILLARY
Glucose-Capillary: 108 mg/dL — ABNORMAL HIGH (ref 70–99)
Glucose-Capillary: 134 mg/dL — ABNORMAL HIGH (ref 70–99)
Glucose-Capillary: 112 mg/dL — ABNORMAL HIGH (ref 70–99)
Glucose-Capillary: 92 mg/dL (ref 70–99)
Glucose-Capillary: 105 mg/dL — ABNORMAL HIGH (ref 70–99)
Glucose-Capillary: 141 mg/dL — ABNORMAL HIGH (ref 70–99)

## 2019-08-16 LAB — HEPARIN LEVEL (UNFRACTIONATED)
Heparin Unfractionated: 0.96 IU/mL — ABNORMAL HIGH (ref 0.30–0.70)
Heparin Unfractionated: 0.58 IU/mL (ref 0.30–0.70)

## 2019-08-16 MED ORDER — OXYCODONE-ACETAMINOPHEN 5-325 MG PO TABS: 1.0000 | ORAL_TABLET | Freq: Four times a day (QID) | ORAL | Status: DC | PRN

## 2019-08-16 MED ORDER — TRAZODONE HCL 50 MG PO TABS
50.0000 mg | ORAL_TABLET | Freq: Every evening | ORAL | Status: DC | PRN
  Administered 2019-08-16: 50 mg via ORAL
  Filled 2019-08-16: qty 1

## 2019-08-16 MED ORDER — APIXABAN 5 MG PO TABS
10.0000 mg | ORAL_TABLET | Freq: Two times a day (BID) | ORAL | Status: DC
  Administered 2019-08-16 – 2019-08-17 (×3): 10 mg via ORAL
  Filled 2019-08-16 (×3): qty 2

## 2019-08-16 MED ORDER — SODIUM CHLORIDE 0.9% FLUSH
3.0000 mL | Freq: Two times a day (BID) | INTRAVENOUS | Status: DC
  Administered 2019-08-16 – 2019-08-17 (×3): 3 mL via INTRAVENOUS

## 2019-08-16 MED ORDER — SODIUM CHLORIDE 0.9% FLUSH: 3.0000 mL | INTRAVENOUS | Status: DC | PRN

## 2019-08-16 MED ORDER — POTASSIUM CHLORIDE CRYS ER 20 MEQ PO TBCR
20.0000 meq | EXTENDED_RELEASE_TABLET | Freq: Once | ORAL | Status: AC
  Administered 2019-08-16: 20 meq via ORAL
  Filled 2019-08-16: qty 1

## 2019-08-16 MED ORDER — APIXABAN 5 MG PO TABS: 5.0000 mg | ORAL_TABLET | Freq: Two times a day (BID) | ORAL | Status: DC

## 2019-08-16 MED ORDER — MEMANTINE HCL 10 MG PO TABS
10.0000 mg | ORAL_TABLET | Freq: Every day | ORAL | Status: DC
  Administered 2019-08-16 – 2019-08-17 (×2): 10 mg via ORAL
  Filled 2019-08-16 (×2): qty 1

## 2019-08-16 MED ORDER — SERTRALINE HCL 50 MG PO TABS
100.0000 mg | ORAL_TABLET | Freq: Every day | ORAL | Status: DC
  Administered 2019-08-16: 100 mg via ORAL
  Filled 2019-08-16: qty 2

## 2019-08-16 NOTE — Progress Notes (Signed)
Pt leg is still swollen but warm. Capillary refill less than 3 seconds and pulse easily found on rt foot.  Most recent measurement shows no increase in circumference.

## 2019-08-16 NOTE — Progress Notes (Signed)
[facility] NAME: Tiffany Arnold    MR#:  [id number]  DATE OF BIRTH:  November 28, 1941  SUBJECTIVE:  CHIEF COMPLAINT:   Chief Complaint  Patient presents with  . Leg Swelling  Patient seen and evaluated today Decreased pain in the right leg Decreased right leg redness in the calf No fever  REVIEW OF SYSTEMS:    ROS  CONSTITUTIONAL: No documented fever. No fatigue, weakness. No weight gain, no weight loss.  EYES: No blurry or double vision.  ENT: No tinnitus. No postnasal drip. No redness of the oropharynx.  RESPIRATORY: No cough, no wheeze, no hemoptysis. No dyspnea.  CARDIOVASCULAR: No chest pain. No orthopnea. No palpitations. No syncope.  GASTROINTESTINAL: No nausea, no vomiting or diarrhea. No abdominal pain. No melena or hematochezia.  GENITOURINARY: No dysuria or hematuria.  ENDOCRINE: No polyuria or nocturia. No heat or cold intolerance.  HEMATOLOGY: No anemia. No bruising. No bleeding.  INTEGUMENTARY: No rashes. No lesions.  MUSCULOSKELETAL: No arthritis. Decreased right leg swelling. No gout.  NEUROLOGIC: No numbness, tingling, or ataxia. No seizure-type activity.  PSYCHIATRIC: No anxiety. No insomnia. No ADD.   DRUG ALLERGIES:   Allergies  Allergen Reactions  . Other Shortness Of Breath, Swelling and Other (See Comments)    Cats, dogs, mold Induced asthma Cats, dogs, mold    VITALS:  Blood pressure 127/67, pulse 88, temperature 98.3 F (36.8 C), temperature source Oral, resp. rate 16, height 5' (1.524 m), weight 111.7 kg, SpO2 93 %.  PHYSICAL EXAMINATION:   Physical Exam  GENERAL:  78 y.o.-year-old patient lying in the bed with no acute distress.  EYES: Pupils equal, round, reactive to light and accommodation. No scleral icterus. Extraocular muscles intact.  HEENT: Head atraumatic, normocephalic. Oropharynx and nasopharynx clear.  NECK:  Supple, no jugular venous distention. No thyroid enlargement, no  tenderness.  LUNGS: Normal breath sounds bilaterally, no wheezing, rales, rhonchi. No use of accessory muscles of respiration.  CARDIOVASCULAR: S1, S2 normal. No murmurs, rubs, or gallops.  ABDOMEN: Soft, nontender, nondistended. Bowel sounds present. No organomegaly or mass.  EXTREMITIES: No cyanosis, clubbing  Right lower extremity redness better Right lower extremity swelling better  pulses present.    NEUROLOGIC: Cranial nerves II through XII are intact. No focal Motor or sensory deficits b/l.   PSYCHIATRIC: The patient is alert and oriented x 3.  SKIN: No obvious rash, lesion, or ulcer.   LABORATORY PANEL:   CBC Recent Labs  Lab 08/16/19 0222  WBC 10.1  HGB 10.9*  HCT 37.0  PLT 249   ------------------------------------------------------------------------------------------------------------------ Chemistries  Recent Labs  Lab 08/16/19 0222  NA 142  K 3.5  CL 108  CO2 24  GLUCOSE 147*  BUN 16  CREATININE 0.62  CALCIUM 8.9  AST 18  ALT 13  ALKPHOS 70  BILITOT 0.7   ------------------------------------------------------------------------------------------------------------------  Cardiac Enzymes No results for input(s): TROPONINI in the last 168 hours. ------------------------------------------------------------------------------------------------------------------  RADIOLOGY:  US Venous Img Lower Unilateral Right  Result Date: 08/15/2019 CLINICAL DATA:  78 year old female with a history of right leg swelling for a week EXAM: RIGHT LOWER EXTREMITY VENOUS DOPPLER ULTRASOUND TECHNIQUE: Gray-scale sonography with graded compression, as well as color Doppler and duplex ultrasound were performed to evaluate the lower extremity deep venous systems from the level of the common femoral vein and including the common femoral, femoral, profunda femoral, popliteal and calf veins including the posterior tibial, peroneal and gastrocnemius veins when visible. The superficial  great  saphenous vein was also interrogated. Spectral Doppler was utilized to evaluate flow at rest and with distal augmentation maneuvers in the common femoral, femoral and popliteal veins. COMPARISON:  None. FINDINGS: Contralateral Common Femoral Vein: Respiratory phasicity is normal and symmetric with the symptomatic side. No evidence of thrombus. Normal compressibility. DVT involving the common femoral vein, femoral vein, popliteal vein, into the peroneal vein and posterior tibial vein. Thrombus extends into the saphenofemoral junction as well as into the profunda vein. Thrombus at the common femoral vein is partially occlusive. Other Findings:  None. IMPRESSION: Sonographic survey of the right lower extremity positive for acute DVT of the common femoral vein, profunda vein, saphenofemoral junction, and through the length of the femoral vein, popliteal vein, into the tibial veins. The thrombus at the common femoral vein is partially occlusive, and uncertain to what degree thrombus extends into the pelvis. Electronically Signed   By: Corrie Mckusick D.O.   On: 08/15/2019 15:57     ASSESSMENT AND PLAN:   78 year old female patient with history of stage II breast cancer status post chemotherapy, radiation therapy, hypertension, diabetes mellitus type 2, sleep apnea, hyperlipidemia on CPAP at bedtimeCurrently under hospitalist service for DVT lower extremity  -Right lower extremity DVT Status post vascular surgery intervention IVC filter placed and thrombectomy done Stent placed in right common iliac vein On heparin drip for anticoagulation  -Type 2 diabetes mellitus Diabetic diet with sliding scale coverage with insulin  -Obstructive sleep apnea CPAP at bedtime  -DVT prophylaxis Currently on heparin drip  All the records are reviewed and case discussed with Care Management/Social Worker. Management plans discussed with the patient, family and they are in agreement.  CODE STATUS: Full  code  DVT Prophylaxis: SCDs  TOTAL TIME TAKING CARE OF THIS PATIENT: 36 minutes.   POSSIBLE D/C IN 2 to 3 DAYS, DEPENDING ON CLINICAL CONDITION.  Saundra Shelling M.D on 08/16/2019 at 12:04 PM  Between 7am to 6pm - Pager - [phone]  After 6pm go to [email or web address] - password [email or web address]  Office  [phone]  CC: Primary care physician; [name], NP  Note: This dictation was prepared with Dragon dictation along with smaller phrase technology. Any transcriptional errors that result from this process are unintentional.

## 2019-08-16 NOTE — Progress Notes (Signed)
Tiffany Arnold Daily Progress Note   Subjective: 1 Day Post-Op: 1. Ultrasound guidance for vascular access to the right common femoral  2. Catheter placement into the inferior vena cava for placement of IVC filter 3. Inferior venacavogram 4. Placement of a [place]ali IVC filter infrarenal 5.Mechanical thrombectomy of the right common femoral; right external iliac vein and right common iliac vein 6. Infusion thrombolysis with 10 mg of TPA 7.Percutaneous transluminal angioplasty and stent placement right common iliac vein  Patient with some right lower extremity discomfort this a.m.  No issues overnight.  Objective: Vitals:   08/15/19 2300 08/15/19 2307 08/16/19 0416 08/16/19 1124  BP:  129/90 127/86 127/67  Pulse:  (!) 101 95 88  Resp: 17 20 18 16   Temp:  (!) 97.5 F (36.4 C) 98.1 F (36.7 C) 98.3 F (36.8 C)  TempSrc:  Oral Oral Oral  SpO2:  97% 100% 98%  Weight:  111.7 kg    Height:  5' (1.524 m)      Intake/Output Summary (Last 24 hours) at 08/16/2019 1133 Last data filed at 08/15/2019 2240 Gross per 24 hour  Intake 950 ml  Output 450 ml  Net 500 ml   Physical Exam: A&Ox3, NAD CV: RRR Pulmonary: CTA Bilaterally Abdomen: Soft, Nontender, Nondistended Right groin: Access site clean dry intact.  No swelling or drainage noted. Vascular:  Right lower extremity: Thigh and calf are softer today when compared to yesterday.  Still with moderate swelling.  Extremity is now warm distally to toes.  Foot is now warm and pink.  With a good capillary refill.  Motor/sensory is intact.   Laboratory: CBC    Component Value Date/Time   WBC 10.1 08/16/2019 0222   HGB 10.9 (L) 08/16/2019 0222   HGB 10.3 (L) 09/22/2017 1003   HCT 37.0 08/16/2019 0222   HCT 31.9 (L) 09/22/2017 1003   PLT 249 08/16/2019 0222   PLT 350 09/22/2017 1003   BMET    Component Value Date/Time   NA 142 08/16/2019 0222   NA 140 09/22/2017 1003   K 3.5 08/16/2019 0222   K 3.8 09/22/2017 1003   CL 108 08/16/2019 0222   CO2 24 08/16/2019 0222   CO2 29 09/22/2017 1003   GLUCOSE 147 (H) 08/16/2019 0222   GLUCOSE 116 09/22/2017 1003   BUN 16 08/16/2019 0222   BUN 22.4 09/22/2017 1003   CREATININE 0.62 08/16/2019 0222   CREATININE 0.71 07/02/2018 1514   CREATININE 0.8 09/22/2017 1003   CALCIUM 8.9 08/16/2019 0222   CALCIUM 10.0 09/22/2017 1003   GFRNONAA >60 08/16/2019 0222   GFRAA >60 08/16/2019 0222   Assessment/Planning: The patient is a 78 year old female who presented to the [facility] emergency department with an extensive right lower extremity DVT taken emergently to the endovascular suite for IVC filter placement and lysis 1) improvement in the patient's right lower extremity exam this a.m.  Extremity is now well perfused distally to the toes. 2) encouraged elevation of the right lower extremity heart level higher as much as possible to help with the edema. 3) ordered OT and PT to assess patient's safety for discharge home. 4) would transition to p.o. Eliquis when medically appropriate.  Discussed with Dr. Eber Hong Tiffany Art PA-C 08/16/2019 11:33 AM

## 2019-08-16 NOTE — Anesthesia Postprocedure Evaluation (Signed)
Anesthesia Post Note  Patient: Tiffany Arnold  Procedure(s) Performed: PERIPHERAL VASCULAR THROMBECTOMY WITH IVC FILTER (Right )  Patient location during evaluation: PACU Anesthesia Type: General Level of consciousness: awake and alert Pain management: pain level controlled Vital Signs Assessment: post-procedure vital signs reviewed and stable Respiratory status: spontaneous breathing, nonlabored ventilation, respiratory function stable and patient connected to nasal cannula oxygen Cardiovascular status: blood pressure returned to baseline and stable Postop Assessment: no apparent nausea or vomiting Anesthetic complications: no     Last Vitals:  Vitals:   08/15/19 2300 08/15/19 2307  BP:  129/90  Pulse:  (!) 101  Resp: 17 20  Temp:  (!) 36.4 C  SpO2:  97%    Last Pain:  Vitals:   08/16/19 0022  TempSrc:   PainSc: 0-No pain                 Precious Haws Freida Nebel

## 2019-08-16 NOTE — Progress Notes (Addendum)
Pt arrived from PACU and initial assessment done of site and extremities at 23:10. Some swelling of the rt leg and +1 pulses felt on feet. Both feet are cool and dry. On reassessment at 00:35 with a second nurse to do overall skin, pt rt leg looks slightly more swollen, warmer and edemetous than from admission. Pt is also having mild watery bleeding from her vagina or anus. Per pt this is normal for her and she has this bleeding at home however she cannot verbalize the reason for this bleeding. Daughter contacted and confirmed that pt has hemoroidal issues and the bleeding is normal. Thigh circumference measured at 24 1/2 inches for reference through this shift. MD made aware and will come visualize the pt shortly.

## 2019-08-16 NOTE — Consult Note (Signed)
ANTICOAGULATION CONSULT NOTE - Initial Consult  Pharmacy Consult for heparin drip Indication: DVT  Allergies  Allergen Reactions  . Other Shortness Of Breath, Swelling and Other (See Comments)    Cats, dogs, mold Induced asthma Cats, dogs, mold    Patient Measurements: Height: 5' (152.4 cm) Weight: 246 lb 4.8 oz (111.7 kg) IBW/kg (Calculated) : 45.5 Heparin Dosing Weight: 71.7 kg  Vital Signs: Temp: 97.5 F (36.4 C) (08/24 2307) Temp Source: Oral (08/24 2307) BP: 129/90 (08/24 2307) Pulse Rate: 101 (08/24 2307)  Labs: Recent Labs    08/15/19 1515 08/16/19 0222  HGB 12.8 10.9*  HCT 41.4 37.0  PLT 259 249  APTT 28  --   LABPROT 13.2  --   INR 1.0  --   HEPARINUNFRC  --  0.96*  CREATININE 0.71 0.62    Estimated Creatinine Clearance: 69.1 mL/min (by C-G formula based on SCr of 0.62 mg/dL).   Medical History: Past Medical History:  Diagnosis Date  . Allergic rhinitis, seasonal   . Arm bruise    left arm down to hand from fall 01-05-2019  . Bilateral renal cysts    simple (monitored by dr Karsten Ro)  . Chronic cystitis    urologist-- dr Karsten Ro  . GAD (generalized anxiety disorder)   . History of cancer chemotherapy 09-09-2016  to 11-12-2016   breast cancer  . History of external beam radiation therapy 12-24-2016  to 02-03-2017   right breast  . History of recurrent UTIs   . Humerus fracture 01/05/2019   left ,  hx total shoulder arthroplasty   . Hyperlipidemia   . Hypertension   . IBS (irritable bowel syndrome)    mixed  . Lymphedema of right upper extremity   . 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  Memory disorder 06/22/2017   with intermittant confusion;  neurologist-- dr Krista Blue (note in epic)  . Mild persistent asthma    no inhaler  . OA (osteoarthritis)   . OSA on CPAP   . Type 2 diabetes, diet controlled ([place])   . Urge incontinence of urine   . Wears glasses     Medications:  Medications Prior to Admission  Medication Sig Dispense Refill Last Dose  . acetaminophen (TYLENOL) 500 MG tablet Take 1,000 mg by mouth every 6 (six) hours as needed for moderate pain or headache.    prn at prn  . alendronate (FOSAMAX) 70 MG tablet TAKE 1 TABLET EVERY 7 DAYS WITH A FULL GLASS OF WATER ON AN EMPTY STOMACH DO NOT LIE DOWN FOR AT LEAST 30 MIN (Patient taking differently: Take 70 mg by mouth every Tuesday. ) 4 tablet 5 Past Week at Unknown time  . cetirizine (ZYRTEC) 10 MG tablet Take 10 mg by mouth every evening.    08/14/2019 at Unknown time  . Cholecalciferol (VITAMIN D-3) 125 MCG (5000 UT) TABS Take 5,000 Units by mouth daily.   08/15/2019 at 1200  . exemestane (AROMASIN) 25 MG tablet Take 1 tablet (25 mg total) by mouth daily. 30 tablet 6 08/15/2019 at 1200  . ferrous sulfate 325 (65 FE) MG tablet Take 325 mg by mouth  daily with breakfast.   08/15/2019 at 1200  . glipiZIDE (GLUCOTROL) 5 MG tablet Take 1 tablet (5 mg total) by mouth daily before breakfast. 30 tablet 2 08/15/2019 at 1200  . lisinopril (ZESTRIL) 20 MG tablet Take 1 tablet (20 mg total) by mouth daily. 90 tablet 3 08/15/2019 at 1200  . memantine (NAMENDA) 10 MG tablet TAKE ONE TABLET TWICE DAILY 60 tablet 5 08/15/2019 at 1200  . nitrofurantoin, macrocrystal-monohydrate, (MACROBID) 100 MG capsule Take 1 capsule (100 mg total) by mouth 2 (two) times daily. (Patient taking differently: Take 100 mg by mouth every evening. ) 10 capsule 0 08/14/2019 at Unknown time  . Probiotic Product (PROBIOTIC PO) Take 1 capsule by mouth daily.    08/15/2019 at 1200  . rosuvastatin (CRESTOR) 10 MG tablet Take 1 tablet (10 mg total) by mouth daily. 30 tablet 2  08/14/2019 at Unknown time  . sertraline (ZOLOFT) 100 MG tablet Take 1 tablet (100 mg total) by mouth daily. 90 tablet 3 08/15/2019 at 1200   Scheduled:  . diphenhydrAMINE      . fentaNYL      . insulin aspart  0-9 Units Subcutaneous TID WC  . midazolam       Infusions:  . sodium chloride 1,000 mL (08/15/19 1800)  . sodium chloride 75 mL/hr at 08/15/19 2315  . heparin 1,250 Units/hr (08/15/19 2316)   PRN:  Anti-infectives (From admission, onward)   Start     Dose/Rate Route Frequency Ordered Stop   08/15/19 1745  ceFAZolin (ANCEF) IVPB 2g/100 mL premix     2 g 200 mL/hr over 30 Minutes Intravenous  Once 08/15/19 1730 08/15/19 1847      Assessment: Pharmacy has been consulted to initiate heparin drip on 78 yo patient complaining of significant leg swelling, pain and redness. Patient has no history of prior anticoagulant use. Baseline labs have been ordered and are pending.   Goal of Therapy:  Heparin level 0.3-0.7 units/ml Monitor platelets by anticoagulation protocol: Yes   Plan:  08/25 @ 0200 HL 0.96 supratherapeutic. Will decrease rate to 1000 units/hr and will recheck HL @ 1100, CBC had trended down, will continue to monitor.  Tobie Lords, PharmD, BCPS Clinical Pharmacist 08/16/2019,3:30 AM

## 2019-08-16 NOTE — Consult Note (Signed)
ANTICOAGULATION CONSULT NOTE - Initial Consult  Pharmacy Consult for heparin drip Indication: DVT  Allergies  Allergen Reactions  . Other Shortness Of Breath, Swelling and Other (See Comments)    Cats, dogs, mold Induced asthma Cats, dogs, mold    Patient Measurements: Height: 5' (152.4 cm) Weight: 246 lb 4.8 oz (111.7 kg) IBW/kg (Calculated) : 45.5 Heparin Dosing Weight: 71.7 kg  Vital Signs: Temp: 98.3 F (36.8 C) (08/25 1124) Temp Source: Oral (08/25 1124) BP: 127/67 (08/25 1124) Pulse Rate: 88 (08/25 1124)  Labs: Recent Labs    08/15/19 1515 08/16/19 0222 08/16/19 1059  HGB 12.8 10.9*  --   HCT 41.4 37.0  --   PLT 259 249  --   APTT 28  --   --   LABPROT 13.2  --   --   INR 1.0  --   --   HEPARINUNFRC  --  0.96* 0.58  CREATININE 0.71 0.62  --     Estimated Creatinine Clearance: 69.1 mL/min (by C-G formula based on SCr of 0.62 mg/dL).   Medical History: Past Medical History:  Diagnosis Date  . Allergic rhinitis, seasonal   . Arm bruise    left arm down to hand from fall 01-05-2019  . Bilateral renal cysts    simple (monitored by dr Karsten Ro)  . Chronic cystitis    urologist-- dr Karsten Ro  . GAD (generalized anxiety disorder)   . History of cancer chemotherapy 09-09-2016  to 11-12-2016   breast cancer  . History of external beam radiation therapy 12-24-2016  to 02-03-2017   right breast  . History of recurrent UTIs   . Humerus fracture 01/05/2019   left ,  hx total shoulder arthroplasty   . Hyperlipidemia   . Hypertension   . IBS (irritable bowel syndrome)    mixed  . Lymphedema of right upper extremity   . 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 Memory disorder 06/22/2017   with intermittant confusion;  neurologist-- dr Krista Blue (note in epic)  . Mild persistent asthma    no inhaler  . OA (osteoarthritis)   . OSA on CPAP   . Type 2 diabetes, diet controlled ([place])   . Urge incontinence of urine   . Wears glasses     Medications:  Medications Prior to Admission  Medication Sig Dispense Refill Last Dose  . acetaminophen (TYLENOL) 500 MG tablet Take 1,000 mg by mouth every 6 (six) hours as needed for moderate pain or headache.    prn at prn  . alendronate (FOSAMAX) 70 MG tablet TAKE 1 TABLET EVERY 7 DAYS WITH A FULL GLASS OF WATER ON AN EMPTY STOMACH DO NOT LIE DOWN FOR AT LEAST 30 MIN (Patient taking differently: Take 70 mg by mouth every Tuesday. ) 4 tablet 5 Past Week at Unknown time  . cetirizine (ZYRTEC) 10 MG tablet Take 10 mg by mouth every evening.    08/14/2019 at Unknown time  . Cholecalciferol (VITAMIN D-3) 125 MCG (5000 UT) TABS Take 5,000 Units by mouth daily.   08/15/2019 at 1200  . exemestane (AROMASIN) 25 MG tablet Take 1 tablet (25 mg  total) by mouth daily. 30 tablet 6 08/15/2019 at 1200  . ferrous sulfate 325 (65 FE) MG tablet Take 325 mg by mouth daily with breakfast.   08/15/2019 at 1200  . glipiZIDE (GLUCOTROL) 5 MG tablet Take 1 tablet (5 mg total) by mouth daily before breakfast. 30 tablet 2 08/15/2019 at 1200  . lisinopril (ZESTRIL) 20 MG tablet Take 1 tablet (20 mg total) by mouth daily. 90 tablet 3 08/15/2019 at 1200  . memantine (NAMENDA) 10 MG tablet TAKE ONE TABLET TWICE DAILY 60 tablet 5 08/15/2019 at 1200  . nitrofurantoin, macrocrystal-monohydrate, (MACROBID) 100 MG capsule Take 1 capsule (100 mg total) by mouth 2 (two) times daily. (Patient taking differently: Take 100 mg by mouth every evening. ) 10 capsule 0 08/14/2019 at Unknown time  . Probiotic Product (PROBIOTIC PO) Take 1 capsule by mouth daily.    08/15/2019 at 1200  . rosuvastatin (CRESTOR) 10 MG  tablet Take 1 tablet (10 mg total) by mouth daily. 30 tablet 2 08/14/2019 at Unknown time  . sertraline (ZOLOFT) 100 MG tablet Take 1 tablet (100 mg total) by mouth daily. 90 tablet 3 08/15/2019 at 1200   Scheduled:  . insulin aspart  0-9 Units Subcutaneous TID WC  . memantine  10 mg Oral Daily  . sertraline  100 mg Oral QHS  . sodium chloride flush  3 mL Intravenous Q12H   Infusions:  . heparin 1,000 Units/hr (08/16/19 0339)   PRN:  Anti-infectives (From admission, onward)   Start     Dose/Rate Route Frequency Ordered Stop   08/15/19 1745  ceFAZolin (ANCEF) IVPB 2g/100 mL premix     2 g 200 mL/hr over 30 Minutes Intravenous  Once 08/15/19 1730 08/15/19 1847      Assessment: Pharmacy has been consulted to initiate heparin drip on 78 yo patient complaining of significant leg swelling, pain and redness. Patient has no history of prior anticoagulant use. Baseline labs have been ordered and are pending.  8/25 0222 HL 0.96 decrease rate to 1000 units/hr.  8/25 1059 HL 0.58 therapeutic.   Goal of Therapy:  Heparin level 0.3-0.7 units/ml Monitor platelets by anticoagulation protocol: Yes   Plan:  Heparin level therapeutic. Will continue rate at 1000 units/hr and will recheck HL @ 1900, CBC had trended down, will continue to monitor.  Eleonore Chiquito, PharmD, BCPS Clinical Pharmacist 08/16/2019,11:40 AM

## 2019-08-16 NOTE — Progress Notes (Addendum)
Pharmacy called in regards to adjusting heparin gtt to 10 ml/h. The bleeding from earlier that was thought to be from hemoroids has increased and is noted to be coming from her vagina. Pharmacy consulted and this nurse advised to monitor closely. MD made aware.

## 2019-08-16 NOTE — Evaluation (Signed)
Physical Therapy Evaluation Patient Details Name: Tiffany Arnold MRN: [id number] DOB: 1941/09/28 Today's Date: 08/16/2019   History of Present Illness  From MD H&P: Pt is a 78 y.o. female with a known history of stage II breast cancer status post chemotherapy and radiation therapy, lymphedema, hypertension, diabetes mellitus and hyperlipidemia and obstructive sleep apnea on CPAP nightly is presenting to the ED with a chief complaint of worsening of the right leg pain associated with swelling and redness.  Venous Dopplers have revealed extensive RLE DVT which is occluded and extending to the pelvis.  Pt now s/p thrombectomy and IVC filter placement.    Clinical Impression  Pt presents with deficits in strength, transfers, mobility, gait, balance, and activity tolerance.  Pt required max A with bed mobility tasks which is baseline per daughter.  Pt was CGA with transfers and was able to demonstrate good eccentric control during stand to sit.  Pt was able to amb 15' with a RW and CGA with slow cadence and short B step length but was steady without LOB.  Pt will benefit from HHPT services upon discharge to safely address above deficits for decreased caregiver assistance and eventual return to PLOF.      Follow Up Recommendations Home health PT;Supervision/Assistance - 24 hour    Equipment Recommendations  None recommended by PT    Recommendations for Other Services       Precautions / Restrictions Precautions Precautions: Fall Restrictions Weight Bearing Restrictions: No      Mobility  Bed Mobility Overal bed mobility: Needs Assistance Bed Mobility: Supine to Sit     Supine to sit: Max assist        Transfers Overall transfer level: Needs assistance Equipment used: Rolling walker (2 wheeled) Transfers: Sit to/from Stand Sit to Stand: Min guard         General transfer comment: Good eccentric and concentric control with transfers  Ambulation/Gait Ambulation/Gait  assistance: Min guard Gait Distance (Feet): 15 Feet Assistive device: Rolling walker (2 wheeled) Gait Pattern/deviations: Step-through pattern;Decreased step length - right;Decreased step length - left Gait velocity: decreased   General Gait Details: Pt steady with amb with a RW without LOB or buckling  Stairs            Wheelchair Mobility    Modified Rankin (Stroke Patients Only)       Balance Overall balance assessment: Needs assistance   Sitting balance-Leahy Scale: Normal     Standing balance support: Bilateral upper extremity supported Standing balance-Leahy Scale: Good                               Pertinent Vitals/Pain Pain Assessment: No/denies pain    Home Living Family/patient expects to be discharged to:: Private residence Living Arrangements: Spouse/significant other;Children Available Help at Discharge: Family;Available 24 hours/day Type of Home: House Home Access: Ramped entrance     Home Layout: Two level;Able to live on main level with bedroom/bathroom Home Equipment: Walker - 4 wheels;Walker - 2 wheels;Grab bars - toilet;Grab bars - tub/shower;Shower seat;Hospital bed      Prior Function Level of Independence: Needs assistance   Gait / Transfers Assistance Needed: Mod Ind amb with a rollator limited community distances, 3 falls in the last 6 months with 2 of them falls from bed, max A with bed mobility           Hand Dominance        Extremity/Trunk Assessment  Upper Extremity Assessment Upper Extremity Assessment: Generalized weakness    Lower Extremity Assessment Lower Extremity Assessment: Generalized weakness       Communication   Communication: No difficulties  Cognition Arousal/Alertness: Awake/alert Behavior During Therapy: WFL for tasks assessed/performed Overall Cognitive Status: History of cognitive impairments - at baseline                                        General Comments       Exercises     Assessment/Plan    PT Assessment Patient needs continued PT services  PT Problem List Decreased strength;Decreased activity tolerance;Decreased balance;Decreased mobility       PT Treatment Interventions DME instruction;Gait training;Functional mobility training;Therapeutic activities;Therapeutic exercise;Balance training;Patient/family education    PT Goals (Current goals can be found in the Care Plan section)  Acute Rehab PT Goals Patient Stated Goal: "To walk a little bit" PT Goal Formulation: With patient Time For Goal Achievement: 08/29/19 Potential to Achieve Goals: Good    Frequency Min 2X/week   Barriers to discharge        Co-evaluation               AM-PAC PT "6 Clicks" Mobility  Outcome Measure Help needed turning from your back to your side while in a flat bed without using bedrails?: A Lot Help needed moving from lying on your back to sitting on the side of a flat bed without using bedrails?: A Lot Help needed moving to and from a bed to a chair (including a wheelchair)?: A Little Help needed standing up from a chair using your arms (e.g., wheelchair or bedside chair)?: A Little Help needed to walk in hospital room?: A Little Help needed climbing 3-5 steps with a railing? : A Little 6 Click Score: 16    End of Session Equipment Utilized During Treatment: Gait belt Activity Tolerance: Patient tolerated treatment well Patient left: in chair;with chair alarm set;with call bell/phone within reach;with family/visitor present Nurse Communication: Mobility status PT Visit Diagnosis: Muscle weakness (generalized) (M62.81);Difficulty in walking, not elsewhere classified (R26.2)    Time: [id number] PT Time Calculation (min) (ACUTE ONLY): 36 min   Charges:   PT Evaluation $PT Eval Low Complexity: 1 Low          D. Scott Lavene Penagos PT, DPT 08/16/19, 1:29 PM

## 2019-08-16 NOTE — Progress Notes (Addendum)
Pt right leg is unchanged from last assessment at 0448. Leg is not as taut as it was earlier. Pt has no c/o pain. Pt vaginal bleeding has substantially decreased since the heparin gtt was reduced.

## 2019-08-17 ENCOUNTER — Ambulatory Visit: Payer: Medicare PPO

## 2019-08-17 ENCOUNTER — Telehealth: Payer: Self-pay | Admitting: Internal Medicine

## 2019-08-17 LAB — CBC
HCT: 32.1 % — ABNORMAL LOW (ref 36.0–46.0)
Hemoglobin: 9.7 g/dL — ABNORMAL LOW (ref 12.0–15.0)
MCH: 28.8 pg (ref 26.0–34.0)
MCHC: 30.2 g/dL (ref 30.0–36.0)
MCV: 95.3 fL (ref 80.0–100.0)
Platelets: 218 10*3/uL (ref 150–400)
RBC: 3.37 MIL/uL — ABNORMAL LOW (ref 3.87–5.11)
RDW: 13.9 % (ref 11.5–15.5)
WBC: 7.7 10*3/uL (ref 4.0–10.5)
nRBC: 0 % (ref 0.0–0.2)

## 2019-08-17 LAB — GLUCOSE, CAPILLARY
Glucose-Capillary: 100 mg/dL — ABNORMAL HIGH (ref 70–99)
Glucose-Capillary: 149 mg/dL — ABNORMAL HIGH (ref 70–99)

## 2019-08-17 MED ORDER — APIXABAN 5 MG PO TABS: 5.0000 mg | ORAL_TABLET | Freq: Two times a day (BID) | ORAL | 1 refills | Status: DC

## 2019-08-17 MED ORDER — ACETAMINOPHEN 325 MG PO TABS
650.0000 mg | ORAL_TABLET | Freq: Four times a day (QID) | ORAL | Status: DC | PRN
  Administered 2019-08-17: 650 mg via ORAL
  Filled 2019-08-17: qty 2

## 2019-08-17 MED ORDER — APIXABAN 5 MG PO TABS: 10.0000 mg | ORAL_TABLET | Freq: Two times a day (BID) | ORAL | 0 refills | Status: DC

## 2019-08-17 MED ORDER — OXYCODONE-ACETAMINOPHEN 5-325 MG PO TABS: 1.0000 | ORAL_TABLET | Freq: Four times a day (QID) | ORAL | 0 refills | Status: DC | PRN

## 2019-08-17 NOTE — TOC Initial Note (Signed)
Transition of Care [facility]) - Initial/Assessment Note    Patient Details  Name: Tiffany Arnold MRN: [id number] Date of Birth: Aug 28, 1941  Transition of Care [facility]) CM/SW Contact:    Ross Ludwig, LCSW Phone Number: 08/17/2019, 10:45 AM  Clinical Narrative:                  CSW spoke with patient's daughter due to patient sleeping to completed assessment by speaking with patient's daughter Tiffany Arnold [phone].  Patient lives with her daughter where she has been living with her for about a year.  Daughter helps take care of her, CSW provided choice of home health agencies and she chose [facility].  CSW spoke to [facility] and they can accept patient for home Health PT and RN.  Patient's daughter states they do not need any equipment.  CSW also provided an Eliquis coupon which is a new medication for patient.  Patient did not have any other questions or concerns.  Expected Discharge Plan: [place] Barriers to Discharge: Barriers Resolved   Patient Goals and CMS Choice Patient states their goals for this hospitalization and ongoing recovery are:: Patient plans to return back home with daughter with home health PT and RN CMS Medicare.gov Compare Post Acute Care list provided to:: Patient Represenative (must comment) Choice offered to / list presented to : Adult Children  Expected Discharge Plan and Services Expected Discharge Plan: [place] In-house Referral: Clinical Social Work   Post Acute Care Choice: [place] arrangements for the past 2 months: [place] Expected Discharge Date: 08/17/19               DME Arranged: N/A DME Agency: NA       HH Arranged: RN, PT [place] Agency: [facility] ([facility] [employer]) Date [place]: 08/17/19 Time [place]: 1042 Representative spoke with at [place] Arrangements/Services Living arrangements for the past 2 months: [place] Lives with:: Adult Children Patient language and need for interpreter reviewed:: Yes Do you feel safe going back to the place where you live?: Yes      Need for Family Participation in Patient Care: No (Comment) Care giver support system in place?: Yes (comment) Current home services: Home PT, Home RN Criminal Activity/Legal Involvement Pertinent to Current Situation/Hospitalization: No - Comment as needed  Activities of Daily Living Home Assistive Devices/Equipment: Cane (specify quad or straight), Walker (specify type) ADL Screening (condition at time of admission) Patient's cognitive ability adequate to safely complete daily activities?: Yes Is the patient deaf or have difficulty hearing?: No Does the patient have difficulty seeing, even when wearing glasses/contacts?: No Does the patient have difficulty concentrating, remembering, or making decisions?: No Patient able to express need for assistance with ADLs?: Yes Does the patient have difficulty dressing or bathing?: Yes Independently performs ADLs?: No Communication: Independent Dressing (OT): Needs assistance Is this a change from baseline?: Pre-admission baseline Grooming: Independent Feeding: Independent Bathing: Needs assistance Is this a change from baseline?: Pre-admission baseline Toileting: Needs assistance Is this a change from baseline?: Pre-admission baseline In/Out Bed: Needs assistance Is this a change from baseline?: Pre-admission baseline Walks in Home: Needs assistance Is this a change from baseline?: Pre-admission baseline Does the patient have difficulty walking or climbing stairs?: Yes Weakness of Legs: Both Weakness of Arms/Hands: None  Permission Sought/Granted Permission sought to share information with : Family Supports Permission granted to share information  with : Yes, Verbal Permission Granted  Share Information with NAME: blankenship, rachel Daughter   [phone]  Permission granted to  share info w AGENCY: Home Health Agencies        Emotional Assessment Appearance:: Appears stated age   Affect (typically observed): Accepting, Appropriate, Calm, Pleasant Orientation: : Oriented to Self, Oriented to Place, Oriented to  Time, Oriented to Situation Alcohol / Substance Use: Not Applicable Psych Involvement: No (comment)  Admission diagnosis:  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 DVT of lower limb, acute ([place]) 08/15/2019  . Iron deficiency anemia 07/08/2018  . Recurrent UTI 01/01/2018  . Memory disorder 06/22/2017  . Osteoporosis 04/01/2017  . Port catheter in place 12/25/2016  . IBS (irritable bowel syndrome) 11/25/2016  . Breast cancer of lower-inner quadrant of right female breast ([place]) 06/27/2016  . OSA on CPAP 06/26/2016  . Type 2 diabetes mellitus without complication ([place]) [id number]  . Essential hypertension 09/04/2014  . HLD (hyperlipidemia) 09/04/2014  . Gastroesophageal reflux disease without esophagitis 09/04/2014  . Seasonal allergies 09/04/2014  . Generalized anxiety disorder 09/04/2014   PCP:  [name], NP Pharmacy:   [place], [place] - [address] [address] Phone: [phone] Fax: [phone]     Social Determinants of Health (SDOH) Interventions    Readmission Risk Interventions No flowsheet data found.

## 2019-08-17 NOTE — Telephone Encounter (Signed)
Tiffany Arnold, [facility], said patient will be discharged from the hospital today and [facility] will be following her when she comes home.

## 2019-08-17 NOTE — TOC Transition Note (Signed)
Transition of Care [facility]) - CM/SW Discharge Note   Patient Details  Name: Tiffany Arnold MRN: [id number] Date of Birth: [birth year]  Transition of Care [facility]) CM/SW Contact:  Ross Ludwig, LCSW Phone Number: 08/17/2019, 2:26 PM   Clinical Narrative:    CSW spoke with patient's daughter due to patient sleeping to completed assessment by speaking with patient's daughter [name] [id number].  Patient lives with her daughter where she has been living with her for about a year.  Daughter helps take care of her, CSW provided choice of home health agencies and she chose [facility].  CSW spoke to [facility] and they can accept patient for home Health PT and RN.  Patient's daughter states they do not need any equipment.  CSW also provided an Eliquis coupon which is a new medication for patient.  Patient did not have any other questions or concerns.   Final next level of care: [place] Barriers to Discharge: Barriers Resolved   Patient Goals and CMS Choice Patient states their goals for this hospitalization and ongoing recovery are:: Patient plans to return back home with daughter with home health PT and RN CMS Medicare.gov Compare Post Acute Care list provided to:: Patient Represenative (must comment) Choice offered to / list presented to : Adult Children  Discharge Placement  Patient will be discharging back home with her daughter who lives with her.                 Discharge Plan and Services In-house Referral: Clinical Social Work   Post Acute Care Choice: Home Health          DME Arranged: N/A DME Agency: NA       HH Arranged: RN, PT [place] [facility]: [facility] [facility] [employer] Date [place]: 08/17/19 Time [place]: 1042 Representative spoke with at [place]: [place] ([place]) Interventions     Readmission Risk Interventions No flowsheet data found.

## 2019-08-17 NOTE — Discharge Summary (Signed)
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 Allergic rhinitis, seasonal   . Arm bruise    left arm down to hand from fall 01-05-2019  . Bilateral renal cysts    simple (monitored by dr Karsten Ro)  . Chronic cystitis    urologist-- dr Karsten Ro  . GAD (generalized anxiety disorder)   . History of cancer chemotherapy 09-09-2016  to 11-12-2016   breast cancer  . History of external beam radiation therapy 12-24-2016  to 02-03-2017   right breast  . History of recurrent UTIs   . Humerus fracture 01/05/2019   left ,  hx total shoulder arthroplasty   . Hyperlipidemia   . Hypertension   . IBS (irritable bowel syndrome)    mixed  . Lymphedema of right upper extremity   . 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 Memory disorder 06/22/2017   with intermittant confusion;  neurologist-- dr Krista Blue (note in epic)  . Mild persistent asthma    no inhaler  . OA (osteoarthritis)   . OSA on CPAP   . Type 2 diabetes, diet controlled ([place])   . Urge incontinence of urine   . Wears glasses      ADMITTING HISTORY Tiffany Arnold  is a 78 y.o.  female with a known history of stage II breast cancer status post chemotherapy and radiation therapy, lymphedema, hypertension, diabetes mellitus and hyperlipidemia and obstructive sleep apnea on CPAP nightly is presenting to the ED with a chief complaint of worsening of the right leg pain associated with swelling and redness.  Venous Dopplers have revealed extensive DVT which is occluded and extending to the pelvis.  Stat vascular surgery consult placed and patient was seen by them and who is recommending emergent thrombolysis.  Hospitalist team is called admit the patient.  During my examination patient denies any chest pain or shortness of breath.  Copy test is negative.  Daughter at bedside.  HOSPITAL COURSE:  Patient admitted to the medical floor started on IV heparin drip for anticoagulation.  Was seen by vascular surgery and thrombectomy was done and IVC filter was placed.  Patient tolerated procedures well.  Patient was worked up with venous Doppler ultrasound of lower extremity.  Patient was started on oral Eliquis for anticoagulation and transition from heparin drip to Eliquis.  Patient tolerated diet well and received physical therapy.  Patient will be discharged home with home health services.  CONSULTS OBTAINED:  Treatment Team:  Katha Cabal, MD  DRUG ALLERGIES:   Allergies  Allergen Reactions  . Other Shortness Of Breath, Swelling and Other (See Comments)  Cats, dogs, mold Induced asthma Cats, dogs, mold    DISCHARGE MEDICATIONS:   Allergies as of 08/17/2019      Reactions   Other Shortness Of Breath, Swelling, Other (See Comments)   Cats, dogs, mold Induced asthma Cats, dogs, mold      Medication List    STOP taking these medications   nitrofurantoin (macrocrystal-monohydrate) 100 MG capsule Commonly known as: MACROBID     TAKE these medications   acetaminophen 500 MG tablet Commonly known as: TYLENOL Take 1,000 mg by mouth every 6 (six) hours as needed  for moderate pain or headache.   alendronate 70 MG tablet Commonly known as: FOSAMAX TAKE 1 TABLET EVERY 7 DAYS WITH A FULL GLASS OF WATER ON AN EMPTY STOMACH DO NOT LIE DOWN FOR AT LEAST 30 MIN What changed:   how much to take  how to take this  when to take this  additional instructions   apixaban 5 MG Tabs tablet Commonly known as: ELIQUIS Take 2 tablets (10 mg total) by mouth 2 (two) times daily for 6 days.   apixaban 5 MG Tabs tablet Commonly known as: ELIQUIS Take 1 tablet (5 mg total) by mouth 2 (two) times daily. Start taking on: August 23, 2019   cetirizine 10 MG tablet Commonly known as: ZYRTEC Take 10 mg by mouth every evening.   exemestane 25 MG tablet Commonly known as: AROMASIN Take 1 tablet (25 mg total) by mouth daily.   ferrous sulfate 325 (65 FE) MG tablet Take 325 mg by mouth daily with breakfast.   glipiZIDE 5 MG tablet Commonly known as: GLUCOTROL Take 1 tablet (5 mg total) by mouth daily before breakfast.   lisinopril 20 MG tablet Commonly known as: ZESTRIL Take 1 tablet (20 mg total) by mouth daily.   memantine 10 MG tablet Commonly known as: NAMENDA TAKE ONE TABLET TWICE DAILY   oxyCODONE-acetaminophen 5-325 MG tablet Commonly known as: PERCOCET/ROXICET Take 1 tablet by mouth every 6 (six) hours as needed for moderate pain.   PROBIOTIC PO Take 1 capsule by mouth daily.   rosuvastatin 10 MG tablet Commonly known as: Crestor Take 1 tablet (10 mg total) by mouth daily.   sertraline 100 MG tablet Commonly known as: ZOLOFT Take 1 tablet (100 mg total) by mouth daily.   Vitamin D-3 125 MCG (5000 UT) Tabs Take 5,000 Units by mouth daily.       Today  Patient seen and evaluated today  Decreased pain in the lower extremity Tolerated PT well Hemodynamically stable VITAL SIGNS:  Blood pressure (!) 149/85, pulse 98, temperature 97.7 F (36.5 C), temperature source Oral, resp. rate 18, height 5' (1.524 m), weight 111.7 kg, SpO2  92 %.  I/O:    Intake/Output Summary (Last 24 hours) at 08/17/2019 1348 Last data filed at 08/16/2019 1354 Gross per 24 hour  Intake 240 ml  Output -  Net 240 ml    PHYSICAL EXAMINATION:  Physical Exam  GENERAL:  78 y.o.-year-old patient lying in the bed with no acute distress.  LUNGS: Normal breath sounds bilaterally, no wheezing, rales,rhonchi or crepitation. No use of accessory muscles of respiration.  CARDIOVASCULAR: S1, S2 normal. No murmurs, rubs, or gallops.  ABDOMEN: Soft, non-tender, non-distended. Bowel sounds present. No organomegaly or mass.  NEUROLOGIC: Moves all 4 extremities. PSYCHIATRIC: The patient is alert and oriented x 3.  SKIN: No obvious rash, lesion, or ulcer.   DATA REVIEW:   CBC Recent Labs  Lab 08/17/19 0541  WBC  7.7  HGB 9.7*  HCT 32.1*  PLT 218    Chemistries  Recent Labs  Lab 08/16/19 0222  NA 142  K 3.5  CL 108  CO2 24  GLUCOSE 147*  BUN 16  CREATININE 0.62  CALCIUM 8.9  AST 18  ALT 13  ALKPHOS 70  BILITOT 0.7    Cardiac Enzymes No results for input(s): TROPONINI in the last 168 hours.  Microbiology Results  Results for orders placed or performed during the hospital encounter of 08/15/19  SARS Coronavirus 2 [facility] order, Performed in [place] hospital lab)     Status: None   Collection Time: 08/15/19  4:14 PM  Result Value Ref Range Status   SARS Coronavirus 2 NEGATIVE NEGATIVE Final    Comment: (NOTE) If result is NEGATIVE SARS-CoV-2 target nucleic acids are NOT DETECTED. The SARS-CoV-2 RNA is generally detectable in upper and lower  respiratory specimens during the acute phase of infection. The lowest  concentration of SARS-CoV-2 viral copies this assay can detect is 250  copies / mL. A negative result does not preclude SARS-CoV-2 infection  and should not be used as the sole basis for treatment or other  patient management decisions.  A negative result may occur with  improper specimen collection / handling,  submission of specimen other  than nasopharyngeal swab, presence of viral mutation(s) within the  areas targeted by this assay, and inadequate number of viral copies  (<250 copies / mL). A negative result must be combined with clinical  observations, patient history, and epidemiological information. If result is POSITIVE SARS-CoV-2 target nucleic acids are DETECTED. The SARS-CoV-2 RNA is generally detectable in upper and lower  respiratory specimens dur ing the acute phase of infection.  Positive  results are indicative of active infection with SARS-CoV-2.  Clinical  correlation with patient history and other diagnostic information is  necessary to determine patient infection status.  Positive results do  not rule out bacterial infection or co-infection with other viruses. If result is PRESUMPTIVE POSTIVE SARS-CoV-2 nucleic acids MAY BE PRESENT.   A presumptive positive result was obtained on the submitted specimen  and confirmed on repeat testing.  While 2019 novel coronavirus  (SARS-CoV-2) nucleic acids may be present in the submitted sample  additional confirmatory testing may be necessary for epidemiological  and / or clinical management purposes  to differentiate between  SARS-CoV-2 and other Sarbecovirus currently known to infect humans.  If clinically indicated additional testing with an alternate test  methodology [phone]) is advised. The SARS-CoV-2 RNA is generally  detectable in upper and lower respiratory sp ecimens during the acute  phase of infection. The expected result is Negative. Fact Sheet for Patients:  StrictlyIdeas.no Fact Sheet for Healthcare Providers: BankingDealers.co.za This test is not yet approved or cleared by the [place] FDA and has been authorized for detection and/or diagnosis of SARS-CoV-2 by FDA under an Emergency Use Authorization (EUA).  This EUA will remain in effect (meaning this test can be  used) for the duration of the COVID-19 declaration under Section 564(b)(1) of the Act, 21 U.S.C. section 360bbb-3(b)(1), unless the authorization is terminated or revoked sooner. Performed at [facility], [address] [place], [place] [address]     RADIOLOGY:  US Venous Img Lower Unilateral Right  Result Date: 08/15/2019 CLINICAL DATA:  78 year old female with a history of right leg swelling for a week EXAM: RIGHT LOWER EXTREMITY VENOUS DOPPLER ULTRASOUND TECHNIQUE: Gray-scale sonography with graded compression, as well as color  Doppler and duplex ultrasound were performed to evaluate the lower extremity deep venous systems from the level of the common femoral vein and including the common femoral, femoral, profunda femoral, popliteal and calf veins including the posterior tibial, peroneal and gastrocnemius veins when visible. The superficial great saphenous vein was also interrogated. Spectral Doppler was utilized to evaluate flow at rest and with distal augmentation maneuvers in the common femoral, femoral and popliteal veins. COMPARISON:  None. FINDINGS: Contralateral Common Femoral Vein: Respiratory phasicity is normal and symmetric with the symptomatic side. No evidence of thrombus. Normal compressibility. DVT involving the common femoral vein, femoral vein, popliteal vein, into the peroneal vein and posterior tibial vein. Thrombus extends into the saphenofemoral junction as well as into the profunda vein. Thrombus at the common femoral vein is partially occlusive. Other Findings:  None. IMPRESSION: Sonographic survey of the right lower extremity positive for acute DVT of the common femoral vein, profunda vein, saphenofemoral junction, and through the length of the femoral vein, popliteal vein, into the tibial veins. The thrombus at the common femoral vein is partially occlusive, and uncertain to what degree thrombus extends into the pelvis. Electronically Signed   By: Corrie Mckusick  D.O.   On: 08/15/2019 15:57    Follow up with PCP in 1 week.  Management plans discussed with the patient, family and they are in agreement.  CODE STATUS: Full code    Code Status Orders  (From admission, onward)         Start     Ordered   08/15/19 2254  Full code  Continuous     08/15/19 2253        Code Status History    Date Active Date Inactive Code Status Order ID Comments User Context   01/27/2019 2013 01/29/2019 1802 Full Code [id number]  Marcellus Scott Inpatient   07/06/2017 1700 07/09/2017 2127 DNR [id number]  Baxter Hire, MD Inpatient   04/27/2017 1745 04/30/2017 1714 Full Code [id number]  Caren Griffins, MD Inpatient   07/29/2016 1949 07/30/2016 1433 Full Code [id number]  Fanny Skates, MD Inpatient   Advance Care Planning Activity      TOTAL TIME TAKING CARE OF THIS PATIENT ON DAY OF DISCHARGE: more than 36 minutes.   Saundra Shelling M.D on 08/17/2019 at 1:48 PM  Between 7am to 6pm - Pager - [phone]  After 6pm go to [email or web address] - password [email or web address] [facility]  Office  [phone]  CC: Primary care physician; [name], NP  Note: This dictation was prepared with Dragon dictation along with smaller phrase technology. Any transcriptional errors that result from this process are unintentional.

## 2019-08-17 NOTE — Discharge Instructions (Signed)
Phlebitis Phlebitis is soreness and swelling (inflammation) of a vein. Follow these instructions at home: Managing pain, stiffness, and swelling  If told, apply heat to the affected area. Do this as often as told by your doctor. Use the heat source that your doctor tells you to use. This may include a moist heat pack or a heating pad. ? Place a towel between your skin and the heat source. ? Leave the heat on for 20-30 minutes. ? Take off the heat if your skin turns bright red. This is very important if you cannot feel pain, heat, or cold. You may be more likely to get burned.  Raise (elevate) the affected area above the level of your heart while you are sitting or lying down. Medicines  Take over-the-counter and prescription medicines only as told by your doctor.  If you were prescribed an antibiotic medicine, take it as told by your doctor. Do not stop taking the antibiotic even if your condition gets better.  If you take medicines to thin your blood, carry a medical alert card or wear your medical alert jewelry. General instructions   If you have phlebitis in your legs: ? Do not stand or sit for a long time. ? Keep your legs moving. ? Get up and take short walks if you have to sit for a long time. ? Try to avoid bed rest that lasts for a long time. Regular sleep is not bed rest.  Wear compression stockings as told by your doctor. These stockings help: ? To reduce swelling in your legs. ? To prevent blood clots. ? To stop the condition from coming back.  Do not use any products that contain nicotine or tobacco, such as cigarettes and e-cigarettes. If you need help quitting, ask your doctor.  Keep all follow-up visits as told by your doctor. This is important. This may include any follow-up blood tests. Contact a doctor if:  You have strange bruises.  You have bleeding problems.  Your symptoms do not get better.  Your symptoms get worse.  You are taking medicine to  treat swelling (anti-inflammatory medicine) and you get belly (abdominal) pain. Get help right away if:  You have sudden chest pain.  You suddenly have trouble breathing.  You have a fever and your symptoms get worse.  You cough up blood.  You feel dizzy or you pass out.  You have very bad pain and swelling in the affected arm or leg. These symptoms may be an emergency. Do not wait to see if the symptoms will go away. Get medical help right away. Call your local emergency services (911 in the [place] Do not drive yourself to the hospital. Summary  Phlebitis is soreness and swelling (inflammation) of a vein.  Raise (elevate) the affected area above the level of your heart while you are sitting or lying down.  If told, apply heat to the affected area. Do this as often as told by your doctor. Use the heat source that your doctor tells you to use. This may include a moist heat pack or a heating pad.  Take over-the-counter and prescription medicines only as told by your doctor. This information is not intended to replace advice given to you by your health care provider. Make sure you discuss any questions you have with your health care provider. Document Released: 11/26/2009 Document Revised: 01/18/2019 Document Reviewed: 01/13/2017 Elsevier Patient Education  2020 [place]. Vascular Surgery Discharge Instructions 1) You may shower.  Please keep your  groins clean and dry. 2) Elevate your right lower extremity heart level higher as much as possible

## 2019-08-18 ENCOUNTER — Telehealth: Payer: Self-pay | Admitting: Internal Medicine

## 2019-08-18 NOTE — Telephone Encounter (Signed)
Transition Care Management Follow-up Telephone Call  DATE OF ADMISSION:  08/15/2019    ADMITTING PHYSICIAN: Nicholes Mango, MD  DATE OF DISCHARGE: 08/17/2019  1:00 PM  PRIMARY CARE PHYSICIAN: [name], NP    Date discharged?  08/17/2019   How have you been since you were released from the hospital? Spoke with [name] (sister), pt is doing well today, not in a whole lot of pain. She is starting to get up and move around and walk around every hour.    Do you understand why you were in the hospital? no   Do you understand the discharge instructions? yes, "kind of vague", foot is looking better "starting to pink up"   Where were you discharged to? Home    Items Reviewed:  Medications reviewed: yes  Allergies reviewed: yes  Dietary changes reviewed: yes  Referrals reviewed: yes   Functional Questionnaire:   Activities of Daily Living (ADLs):   She states they are independent in the following: ambulation, bathing and hygiene, feeding, continence, grooming, toileting, dressing and is needing some help with dinner time and settling down at night for bed States they require assistance with the following: [facility] will be coming in the home to help   Any transportation issues/concerns?: no, [name] will be bringing patient to the appt.   Any patient concerns? no   Confirmed importance and date/time of follow-up visits scheduled no  Provider Appointment booked with [name], NP on 08/24/2019 at 2pm  Confirmed with patient if condition begins to worsen call PCP or go to the ER.  Patient was given the office number and encouraged to call back with question or concerns.  : yes

## 2019-08-19 DIAGNOSIS — C50911 Malignant neoplasm of unspecified site of right female breast: Secondary | ICD-10-CM | POA: Diagnosis not present

## 2019-08-19 DIAGNOSIS — I824Z1 Acute embolism and thrombosis of unspecified deep veins of right distal lower extremity: Secondary | ICD-10-CM | POA: Diagnosis not present

## 2019-08-19 DIAGNOSIS — J4530 Mild persistent asthma, uncomplicated: Secondary | ICD-10-CM | POA: Diagnosis not present

## 2019-08-19 DIAGNOSIS — F0280 Dementia in other diseases classified elsewhere without behavioral disturbance: Secondary | ICD-10-CM | POA: Diagnosis not present

## 2019-08-19 DIAGNOSIS — E785 Hyperlipidemia, unspecified: Secondary | ICD-10-CM | POA: Diagnosis not present

## 2019-08-19 DIAGNOSIS — E119 Type 2 diabetes mellitus without complications: Secondary | ICD-10-CM | POA: Diagnosis not present

## 2019-08-19 DIAGNOSIS — I10 Essential (primary) hypertension: Secondary | ICD-10-CM | POA: Diagnosis not present

## 2019-08-19 DIAGNOSIS — G300 Alzheimer's disease with early onset: Secondary | ICD-10-CM | POA: Diagnosis not present

## 2019-08-19 DIAGNOSIS — F411 Generalized anxiety disorder: Secondary | ICD-10-CM | POA: Diagnosis not present

## 2019-08-21 DIAGNOSIS — Z4789 Encounter for other orthopedic aftercare: Secondary | ICD-10-CM | POA: Diagnosis not present

## 2019-08-21 DIAGNOSIS — S42202A Unspecified fracture of upper end of left humerus, initial encounter for closed fracture: Secondary | ICD-10-CM | POA: Diagnosis not present

## 2019-08-23 DIAGNOSIS — J4530 Mild persistent asthma, uncomplicated: Secondary | ICD-10-CM | POA: Diagnosis not present

## 2019-08-23 DIAGNOSIS — E119 Type 2 diabetes mellitus without complications: Secondary | ICD-10-CM | POA: Diagnosis not present

## 2019-08-23 DIAGNOSIS — F0280 Dementia in other diseases classified elsewhere without behavioral disturbance: Secondary | ICD-10-CM | POA: Diagnosis not present

## 2019-08-23 DIAGNOSIS — G300 Alzheimer's disease with early onset: Secondary | ICD-10-CM | POA: Diagnosis not present

## 2019-08-23 DIAGNOSIS — I10 Essential (primary) hypertension: Secondary | ICD-10-CM | POA: Diagnosis not present

## 2019-08-23 DIAGNOSIS — I824Z1 Acute embolism and thrombosis of unspecified deep veins of right distal lower extremity: Secondary | ICD-10-CM | POA: Diagnosis not present

## 2019-08-23 DIAGNOSIS — C50911 Malignant neoplasm of unspecified site of right female breast: Secondary | ICD-10-CM | POA: Diagnosis not present

## 2019-08-23 DIAGNOSIS — E785 Hyperlipidemia, unspecified: Secondary | ICD-10-CM | POA: Diagnosis not present

## 2019-08-23 DIAGNOSIS — F411 Generalized anxiety disorder: Secondary | ICD-10-CM | POA: Diagnosis not present

## 2019-08-24 ENCOUNTER — Encounter: Payer: Self-pay | Admitting: Internal Medicine

## 2019-08-24 ENCOUNTER — Other Ambulatory Visit: Payer: Self-pay

## 2019-08-24 ENCOUNTER — Telehealth (INDEPENDENT_AMBULATORY_CARE_PROVIDER_SITE_OTHER): Payer: Self-pay

## 2019-08-24 ENCOUNTER — Other Ambulatory Visit (HOSPITAL_COMMUNITY)
Admission: RE | Admit: 2019-08-24 | Discharge: 2019-08-24 | Disposition: A | Discharge status: Home / Self Care | Payer: Medicare PPO | Source: Ambulatory Visit | Attending: Internal Medicine | Admitting: Internal Medicine

## 2019-08-24 ENCOUNTER — Ambulatory Visit (INDEPENDENT_AMBULATORY_CARE_PROVIDER_SITE_OTHER): Payer: Medicare PPO | Admitting: Internal Medicine

## 2019-08-24 VITALS — BP 142/84 | HR 92 | Temp 98.6°F | Wt 247.0 lb

## 2019-08-24 DIAGNOSIS — Z124 Encounter for screening for malignant neoplasm of cervix: Secondary | ICD-10-CM

## 2019-08-24 DIAGNOSIS — Z780 Asymptomatic menopausal state: Secondary | ICD-10-CM | POA: Insufficient documentation

## 2019-08-24 DIAGNOSIS — I80201 Phlebitis and thrombophlebitis of unspecified deep vessels of right lower extremity: Secondary | ICD-10-CM

## 2019-08-24 DIAGNOSIS — N950 Postmenopausal bleeding: Secondary | ICD-10-CM

## 2019-08-24 DIAGNOSIS — C541 Malignant neoplasm of endometrium: Secondary | ICD-10-CM | POA: Diagnosis not present

## 2019-08-24 NOTE — Progress Notes (Signed)
Subjective:    Patient ID: Tiffany Arnold, female    DOB: 10-10-1941, 78 y.o.   MRN: [id number]  HPI  Pt presents to the clinic today for TCM hospital follow up. She went to the ER 8/24 with c/o right lower extremity pain, swelling and redness. She was found to have a DVT that extended into the pelvis. She was diagnosed with phlegmasia. She was taken to the OR emergently for thrombolysis and IVC filter placement. She was transitioned from IV Heprin to oral Eliquis and discharged home with PT/OT and home health on 8/26. Since discharge, she reports improvement in pain but still has redness and swelling. She has a follow up appt with vascular scheduled.  She also c/o vaginal spotting. She noticed this 2-3 weeks ago. She has noticed it every time she changes her pad. She denies pelvic pain, vaginal discharge, odor or irritation. She has not had a pap smear in a long time. She reports family history of uterine/ovarian cancer.  Review of Systems      Past Medical History:  Diagnosis Date  . Allergic rhinitis, seasonal   . Arm bruise    left arm down to hand from fall 01-05-2019  . Bilateral renal cysts    simple (monitored by dr Karsten Ro)  . Chronic cystitis    urologist-- dr Karsten Ro  . GAD (generalized anxiety disorder)   . History of cancer chemotherapy 09-09-2016  to 11-12-2016   breast cancer  . History of external beam radiation therapy 12-24-2016  to 02-03-2017   right breast  . History of recurrent UTIs   . Humerus fracture 01/05/2019   left ,  hx total shoulder arthroplasty   . Hyperlipidemia   . Hypertension   . IBS (irritable bowel syndrome)    mixed  . Lymphedema of right upper extremity   . 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 Memory disorder 06/22/2017   with intermittant confusion;  neurologist-- dr Krista Blue (note in epic)  . Mild persistent asthma    no inhaler  . OA (osteoarthritis)   . OSA on CPAP   . Type 2 diabetes, diet controlled ([place])   . Urge incontinence of urine   . Wears glasses     Current Outpatient Medications  Medication Sig Dispense Refill  . acetaminophen (TYLENOL) 500 MG tablet Take 1,000 mg by mouth every 6 (six) hours as needed for moderate pain or headache.     . alendronate (FOSAMAX) 70 MG tablet TAKE 1 TABLET EVERY 7 DAYS WITH A FULL GLASS OF WATER ON AN EMPTY STOMACH DO NOT LIE DOWN FOR AT LEAST 30 MIN (Patient taking differently: Take 70 mg by mouth every Tuesday. ) 4 tablet 5  . apixaban (ELIQUIS) 5 MG TABS tablet Take 2 tablets (10 mg total) by mouth 2 (two) times daily for 6 days. 24 tablet 0  . apixaban (ELIQUIS) 5 MG TABS tablet Take 1 tablet (5 mg total) by mouth 2 (two) times daily. 60 tablet 1  . cetirizine (ZYRTEC) 10 MG tablet Take 10 mg by mouth every evening.     . Cholecalciferol (VITAMIN D-3) 125 MCG (  5000 UT) TABS Take 5,000 Units by mouth daily.    Marland Kitchen exemestane (AROMASIN) 25 MG tablet Take 1 tablet (25 mg total) by mouth daily. 30 tablet 6  . ferrous sulfate 325 (65 FE) MG tablet Take 325 mg by mouth daily with breakfast.    . glipiZIDE (GLUCOTROL) 5 MG tablet Take 1 tablet (5 mg total) by mouth daily before breakfast. 30 tablet 2  . lisinopril (ZESTRIL) 20 MG tablet Take 1 tablet (20 mg total) by mouth daily. 90 tablet 3  . memantine (NAMENDA) 10 MG tablet TAKE ONE TABLET TWICE DAILY 60 tablet 5  . oxyCODONE-acetaminophen (PERCOCET/ROXICET) 5-325 MG tablet Take 1 tablet by mouth every 6 (six) hours as needed for moderate pain. 15 tablet 0  . Probiotic Product (PROBIOTIC PO) Take 1 capsule by mouth daily.     . rosuvastatin (CRESTOR) 10 MG tablet Take 1  tablet (10 mg total) by mouth daily. 30 tablet 2  . sertraline (ZOLOFT) 100 MG tablet Take 1 tablet (100 mg total) by mouth daily. 90 tablet 3   No current facility-administered medications for this visit.     Allergies  Allergen Reactions  . Other Shortness Of Breath, Swelling and Other (See Comments)    Cats, dogs, mold Induced asthma Cats, dogs, mold    Family History  Problem Relation Age of Onset  . Arthritis Mother   . Stroke Mother   . Hypertension Mother   . Cancer Father        Prostate  . Stroke Father   . Hypertension Father   . Hypertension Maternal Grandmother   . Rheum arthritis Maternal Grandfather   . Stroke Maternal Grandfather   . Hypertension Maternal Grandfather   . Cancer Paternal Grandmother        Colon  . Hypertension Paternal Grandmother   . Hypertension Paternal Grandfather   . Breast cancer Neg Hx     Social History   Socioeconomic History  . Marital status: Married    Spouse name: Not on file  . Number of children: 2  . Years of education: 2 years college  . Highest education level: Not on file  Occupational History  . Occupation: Retired  Scientific laboratory technician  . Financial resource strain: Not on file  . Food insecurity    Worry: Not on file    Inability: Not on file  . Transportation needs    Medical: Not on file    Non-medical: Not on file  Tobacco Use  . Smoking status: Former Smoker    Packs/day: 2.00    Years: 25.00    Pack years: 50.00    Quit date: 12/23/1991    Years since quitting: 27.6  . Smokeless tobacco: Never Used  Substance and Sexual Activity  . Alcohol use: Not Currently  . Drug use: No  . Sexual activity: Not Currently  Lifestyle  . Physical activity    Days per week: Not on file    Minutes per session: Not on file  . Stress: Not on file  Relationships  . Social Herbalist on phone: Not on file    Gets together: Not on file    Attends religious service: Not on file    Active member of club or  organization: Not on file    Attends meetings of clubs or organizations: Not on file    Relationship status: Not on file  . Intimate partner violence    Fear of current or ex partner: Not  on file    Emotionally abused: Not on file    Physically abused: Not on file    Forced sexual activity: Not on file  Other Topics Concern  . Not on file  Social History Narrative   Lives at home with her husband.   Right-handed.   Occasional caffeine use.     Constitutional: Denies fever, malaise, fatigue, headache or abrupt weight changes.  Respiratory: Denies difficulty breathing, shortness of breath, cough or sputum production.   Cardiovascular: Pt reports swelling of right leg to right hip. Denies chest pain, chest tightness, palpitations or swelling in the hands.  Gastrointestinal: Denies abdominal pain, bloating, constipation, diarrhea or blood in the stool.  GU: Pt reports vaginal bleeding. Denies urgency, frequency, pain with urination, burning sensation, blood in urine, odor or discharge. Musculoskeletal: Pt reports difficulty with gait. Denies decrease in range of motion, muscle pain or joint pain and swelling.  Skin: Pt reports redness of right leg. Denies rashes, lesions or ulcercations.    No other specific complaints in a complete review of systems (except as listed in HPI above).  Objective:   Physical Exam  BP (!) 142/84   Pulse 92   Temp 98.6 F (37 C) (Temporal)   Wt 247 lb (112 kg)   SpO2 98%   BMI 48.24 kg/m  Wt Readings from Last 3 Encounters:  08/24/19 247 lb (112 kg)  08/15/19 246 lb 4.8 oz (111.7 kg)  07/26/19 233 lb 3.2 oz (105.8 kg)    General: Appears her stated age, obese, chronically ill appering in NAD. Skin: Redness noted from tip of toes up above right knee. Cardiovascular: Normal rate and rhythm. S1,S2 noted.  No murmur, rubs or gallops noted. Pulmonary/Chest: Normal effort and positive vesicular breath sounds. No respiratory distress. No wheezes,  rales or ronchi noted.  Abdomen: Soft and nontender. Normal bowel sounds. No distention or masses noted.  GU: Normal female anatomy. Cervix with changes noted. Bright red bleeding noted in the vaginal vault. Musculoskeletal: Gait slow and steady with use of rolling walker. Neurological: Alert and oriented.   BMET    Component Value Date/Time   NA 142 08/16/2019 0222   NA 140 09/22/2017 1003   K 3.5 08/16/2019 0222   K 3.8 09/22/2017 1003   CL 108 08/16/2019 0222   CO2 24 08/16/2019 0222   CO2 29 09/22/2017 1003   GLUCOSE 147 (H) 08/16/2019 0222   GLUCOSE 116 09/22/2017 1003   BUN 16 08/16/2019 0222   BUN 22.4 09/22/2017 1003   CREATININE 0.62 08/16/2019 0222   CREATININE 0.71 07/02/2018 1514   CREATININE 0.8 09/22/2017 1003   CALCIUM 8.9 08/16/2019 0222   CALCIUM 10.0 09/22/2017 1003   GFRNONAA >60 08/16/2019 0222   GFRAA >60 08/16/2019 0222    Lipid Panel     Component Value Date/Time   CHOL 232 (H) 07/14/2019 1519   TRIG 271.0 (H) 07/14/2019 1519   HDL 35.00 (L) 07/14/2019 1519   CHOLHDL 7 07/14/2019 1519   VLDL 54.2 (H) 07/14/2019 1519   LDLCALC 130 (H) 07/02/2018 1514    CBC    Component Value Date/Time   WBC 7.7 08/17/2019 0541   RBC 3.37 (L) 08/17/2019 0541   HGB 9.7 (L) 08/17/2019 0541   HGB 10.3 (L) 09/22/2017 1003   HCT 32.1 (L) 08/17/2019 0541   HCT 31.9 (L) 09/22/2017 1003   PLT 218 08/17/2019 0541   PLT 350 09/22/2017 1003   MCV 95.3 08/17/2019 0541   MCV  80.6 09/22/2017 1003   MCH 28.8 08/17/2019 0541   MCHC 30.2 08/17/2019 0541   RDW 13.9 08/17/2019 0541   RDW 20.1 (H) 09/22/2017 1003   LYMPHSABS 1.4 08/15/2019 1515   LYMPHSABS 1.5 09/22/2017 1003   MONOABS 0.6 08/15/2019 1515   MONOABS 0.4 09/22/2017 1003   EOSABS 0.1 08/15/2019 1515   EOSABS 0.1 09/22/2017 1003   BASOSABS 0.0 08/15/2019 1515   BASOSABS 0.0 09/22/2017 1003    Hgb A1C Lab Results  Component Value Date   HGBA1C 8.1 (H) 07/14/2019            Assessment & Plan:    [facility] Follow Up for Phlegmasia of RLE:  Hospital notes, labs and imaging reviewed. She will continue Eliquis as determined by vascular She will follow up with vascular as an outpatient.  Postmenopausal Bleeding:  Pap smear today US pelvic/transvaginal ultrasound ordered Consider referral to GYN  Return precautions discussed [name], NP

## 2019-08-24 NOTE — Telephone Encounter (Signed)
The patient did have an extensive DVT and based on its location swelling is not uncommon.  It would be cause for concern if the swelling has gotten suddenly worse since the procedure.  If it is the same or gradually lessening that is to be expected.  The patient should be elevating her leg as well as wearing compression socks to help with swelling.  If the patient is able, she should also be walking to help with the swelling.  If the patient likes we can see about moving her office appointment up if she wants to be seen prior to 09/11

## 2019-08-24 NOTE — Patient Instructions (Signed)
Postmenopausal Bleeding  Postmenopausal bleeding is any bleeding that occurs after menopause. Menopause is when a woman's period stops. Any type of bleeding after menopause should be checked by your doctor. Treatment will depend on the cause. Follow these instructions at home:  Pay attention to any changes in your symptoms.  Avoid using tampons and douches as told by your doctor.  Change your pads regularly.  Get regular pelvic exams and Pap tests.  Take iron pills as told by your doctor.  Take over-the-counter and prescription medicines only as told by your doctor.  Keep all follow-up visits as told by your doctor. This is important. Contact a doctor if:  Your bleeding lasts for more than 1 week.  You have pain in your belly (abdomen).  You have bleeding during or after sex.  You have bleeding that happens more often than every 3 weeks. Get help right away if:  You have fever, chills, headache, dizziness, muscle aches, or bleeding.  You have very bad pain with bleeding.  You have clumps of blood (blood clots) coming from your vagina.  You have a lot of bleeding, and: ? You use more than 1 pad an hour. ? This kind of bleeding has never happened before.  You feel like you are going to pass out (faint). Summary  Any type of bleeding after menopause should be checked by your doctor.  Pay attention to any changes in your symptoms.  Keep all follow-up visits as told by your doctor. This information is not intended to replace advice given to you by your health care provider. Make sure you discuss any questions you have with your health care provider. Document Released: 09/16/2008 Document Revised: 02/24/2019 Document Reviewed: 01/13/2017 Elsevier Patient Education  2020 [employer]  

## 2019-08-24 NOTE — Addendum Note (Signed)
Addended by: Lindalou Hose Y on: 08/24/2019 03:00 PM   Modules accepted: Orders

## 2019-08-24 NOTE — Telephone Encounter (Signed)
Patient sister [name]) has made aware with medical recommendations from Eulogio Ditch NP and will inform her sister with medical advice

## 2019-08-25 ENCOUNTER — Other Ambulatory Visit: Payer: Self-pay

## 2019-08-25 ENCOUNTER — Ambulatory Visit
Admission: RE | Admit: 2019-08-25 | Discharge: 2019-08-25 | Disposition: A | Discharge status: Home / Self Care | Payer: Medicare PPO | Source: Ambulatory Visit | Attending: Internal Medicine | Admitting: Internal Medicine

## 2019-08-25 DIAGNOSIS — N950 Postmenopausal bleeding: Secondary | ICD-10-CM | POA: Diagnosis not present

## 2019-08-25 DIAGNOSIS — I824Z1 Acute embolism and thrombosis of unspecified deep veins of right distal lower extremity: Secondary | ICD-10-CM | POA: Diagnosis not present

## 2019-08-25 DIAGNOSIS — F411 Generalized anxiety disorder: Secondary | ICD-10-CM | POA: Diagnosis not present

## 2019-08-25 DIAGNOSIS — G300 Alzheimer's disease with early onset: Secondary | ICD-10-CM | POA: Diagnosis not present

## 2019-08-25 DIAGNOSIS — E785 Hyperlipidemia, unspecified: Secondary | ICD-10-CM | POA: Diagnosis not present

## 2019-08-25 DIAGNOSIS — I10 Essential (primary) hypertension: Secondary | ICD-10-CM | POA: Diagnosis not present

## 2019-08-25 DIAGNOSIS — E119 Type 2 diabetes mellitus without complications: Secondary | ICD-10-CM | POA: Diagnosis not present

## 2019-08-25 DIAGNOSIS — J4530 Mild persistent asthma, uncomplicated: Secondary | ICD-10-CM | POA: Diagnosis not present

## 2019-08-25 DIAGNOSIS — F0280 Dementia in other diseases classified elsewhere without behavioral disturbance: Secondary | ICD-10-CM | POA: Diagnosis not present

## 2019-08-25 DIAGNOSIS — C50911 Malignant neoplasm of unspecified site of right female breast: Secondary | ICD-10-CM | POA: Diagnosis not present

## 2019-08-26 ENCOUNTER — Telehealth: Payer: Self-pay

## 2019-08-26 NOTE — Telephone Encounter (Signed)
Pt's HCPOA, [name], wanted to speak to [facility], again. Said she had a few more questions to ask. Please call her at [phone].

## 2019-08-27 DIAGNOSIS — E785 Hyperlipidemia, unspecified: Secondary | ICD-10-CM | POA: Diagnosis not present

## 2019-08-27 DIAGNOSIS — E119 Type 2 diabetes mellitus without complications: Secondary | ICD-10-CM | POA: Diagnosis not present

## 2019-08-27 DIAGNOSIS — I10 Essential (primary) hypertension: Secondary | ICD-10-CM | POA: Diagnosis not present

## 2019-08-27 DIAGNOSIS — C50911 Malignant neoplasm of unspecified site of right female breast: Secondary | ICD-10-CM | POA: Diagnosis not present

## 2019-08-27 DIAGNOSIS — F0280 Dementia in other diseases classified elsewhere without behavioral disturbance: Secondary | ICD-10-CM | POA: Diagnosis not present

## 2019-08-27 DIAGNOSIS — G300 Alzheimer's disease with early onset: Secondary | ICD-10-CM | POA: Diagnosis not present

## 2019-08-27 DIAGNOSIS — J4530 Mild persistent asthma, uncomplicated: Secondary | ICD-10-CM | POA: Diagnosis not present

## 2019-08-27 DIAGNOSIS — F411 Generalized anxiety disorder: Secondary | ICD-10-CM | POA: Diagnosis not present

## 2019-08-27 DIAGNOSIS — I824Z1 Acute embolism and thrombosis of unspecified deep veins of right distal lower extremity: Secondary | ICD-10-CM | POA: Diagnosis not present

## 2019-08-30 ENCOUNTER — Telehealth: Payer: Self-pay

## 2019-08-30 NOTE — Telephone Encounter (Signed)
[name] left a voicemail stating that she left a voicemail for [name] NP to call her Friday and she never got a call back. [name] stated that she has questions about patient and expects a call back today.

## 2019-08-30 NOTE — Telephone Encounter (Signed)
She has been called by CMA, all questions answered. Case discussed with Junie Panning as well.

## 2019-08-30 NOTE — Telephone Encounter (Signed)
I called Tiffany Arnold who is not in the office to inquire about what is next so that I can let pt and family know. Tiffany Arnold spoke to pt's current Oncologist Tiffany Arnold and was told that their office will take care of it.   I called Tiffany Arnold-sister, ok per DPR and let her know that the pt's current Oncologist Tiffany Arnold told Tiffany Arnold that they will take over as they have someone who specializes in uterine cancer, Tiffany Arnold. Tiffany Arnold expressed understanding and I asked if there were any questions she or family may still have and she stated there were no questions and that she still feels like the ball was dropped because someone should have called them back and whether or not Tiffany Arnold was in the office should have not prevented a return call.

## 2019-08-30 NOTE — Telephone Encounter (Signed)
Spoke to [place], briefly, seems someone else picked up phone asking questions to why they were not called back on Friday in reference to the referral.   I was told there weren't any questions, they were wanting to know why no on called in reference to the referral for oncology GYN. I let them know that I will look into it and let the know what is going on.

## 2019-08-31 DIAGNOSIS — C50911 Malignant neoplasm of unspecified site of right female breast: Secondary | ICD-10-CM | POA: Diagnosis not present

## 2019-08-31 DIAGNOSIS — F411 Generalized anxiety disorder: Secondary | ICD-10-CM | POA: Diagnosis not present

## 2019-08-31 DIAGNOSIS — I824Z1 Acute embolism and thrombosis of unspecified deep veins of right distal lower extremity: Secondary | ICD-10-CM | POA: Diagnosis not present

## 2019-08-31 DIAGNOSIS — G300 Alzheimer's disease with early onset: Secondary | ICD-10-CM | POA: Diagnosis not present

## 2019-08-31 DIAGNOSIS — I10 Essential (primary) hypertension: Secondary | ICD-10-CM | POA: Diagnosis not present

## 2019-08-31 DIAGNOSIS — J4530 Mild persistent asthma, uncomplicated: Secondary | ICD-10-CM | POA: Diagnosis not present

## 2019-08-31 DIAGNOSIS — F0280 Dementia in other diseases classified elsewhere without behavioral disturbance: Secondary | ICD-10-CM | POA: Diagnosis not present

## 2019-08-31 DIAGNOSIS — E119 Type 2 diabetes mellitus without complications: Secondary | ICD-10-CM | POA: Diagnosis not present

## 2019-08-31 DIAGNOSIS — E785 Hyperlipidemia, unspecified: Secondary | ICD-10-CM | POA: Diagnosis not present

## 2019-09-01 ENCOUNTER — Inpatient Hospital Stay (HOSPITAL_COMMUNITY): Admit: 2019-09-01 | Payer: Medicare PPO

## 2019-09-01 ENCOUNTER — Other Ambulatory Visit: Payer: Self-pay

## 2019-09-01 ENCOUNTER — Other Ambulatory Visit (INDEPENDENT_AMBULATORY_CARE_PROVIDER_SITE_OTHER): Payer: Self-pay | Admitting: Vascular Surgery

## 2019-09-01 ENCOUNTER — Inpatient Hospital Stay: Payer: Medicare PPO | Attending: Hematology | Admitting: Gynecologic Oncology

## 2019-09-01 ENCOUNTER — Other Ambulatory Visit (INDEPENDENT_AMBULATORY_CARE_PROVIDER_SITE_OTHER): Payer: Self-pay | Admitting: Nurse Practitioner

## 2019-09-01 VITALS — BP 156/73 | HR 86 | Temp 98.9°F | Ht 60.0 in | Wt 247.0 lb

## 2019-09-01 DIAGNOSIS — C530 Malignant neoplasm of endocervix: Secondary | ICD-10-CM | POA: Diagnosis not present

## 2019-09-01 DIAGNOSIS — Z923 Personal history of irradiation: Secondary | ICD-10-CM | POA: Insufficient documentation

## 2019-09-01 DIAGNOSIS — Z23 Encounter for immunization: Secondary | ICD-10-CM | POA: Diagnosis not present

## 2019-09-01 DIAGNOSIS — Z8042 Family history of malignant neoplasm of prostate: Secondary | ICD-10-CM | POA: Insufficient documentation

## 2019-09-01 DIAGNOSIS — I82401 Acute embolism and thrombosis of unspecified deep veins of right lower extremity: Secondary | ICD-10-CM | POA: Insufficient documentation

## 2019-09-01 DIAGNOSIS — Z79810 Long term (current) use of selective estrogen receptor modulators (SERMs): Secondary | ICD-10-CM | POA: Insufficient documentation

## 2019-09-01 DIAGNOSIS — G3184 Mild cognitive impairment, so stated: Secondary | ICD-10-CM | POA: Diagnosis not present

## 2019-09-01 DIAGNOSIS — C541 Malignant neoplasm of endometrium: Secondary | ICD-10-CM | POA: Insufficient documentation

## 2019-09-01 DIAGNOSIS — M1990 Unspecified osteoarthritis, unspecified site: Secondary | ICD-10-CM | POA: Diagnosis not present

## 2019-09-01 DIAGNOSIS — G4733 Obstructive sleep apnea (adult) (pediatric): Secondary | ICD-10-CM | POA: Insufficient documentation

## 2019-09-01 DIAGNOSIS — K589 Irritable bowel syndrome without diarrhea: Secondary | ICD-10-CM | POA: Insufficient documentation

## 2019-09-01 DIAGNOSIS — Z800 Family history of malignant neoplasm of digestive organs: Secondary | ICD-10-CM | POA: Insufficient documentation

## 2019-09-01 DIAGNOSIS — Z170 Estrogen receptor positive status [ER+]: Secondary | ICD-10-CM | POA: Insufficient documentation

## 2019-09-01 DIAGNOSIS — N9489 Other specified conditions associated with female genital organs and menstrual cycle: Secondary | ICD-10-CM | POA: Insufficient documentation

## 2019-09-01 DIAGNOSIS — I10 Essential (primary) hypertension: Secondary | ICD-10-CM | POA: Diagnosis not present

## 2019-09-01 DIAGNOSIS — C50311 Malignant neoplasm of lower-inner quadrant of right female breast: Secondary | ICD-10-CM | POA: Insufficient documentation

## 2019-09-01 DIAGNOSIS — R609 Edema, unspecified: Secondary | ICD-10-CM | POA: Insufficient documentation

## 2019-09-01 DIAGNOSIS — Z87891 Personal history of nicotine dependence: Secondary | ICD-10-CM | POA: Insufficient documentation

## 2019-09-01 DIAGNOSIS — E119 Type 2 diabetes mellitus without complications: Secondary | ICD-10-CM | POA: Insufficient documentation

## 2019-09-01 DIAGNOSIS — C50211 Malignant neoplasm of upper-inner quadrant of right female breast: Secondary | ICD-10-CM | POA: Diagnosis not present

## 2019-09-01 DIAGNOSIS — Z79899 Other long term (current) drug therapy: Secondary | ICD-10-CM | POA: Insufficient documentation

## 2019-09-01 DIAGNOSIS — S70329A Blister (nonthermal), unspecified thigh, initial encounter: Secondary | ICD-10-CM | POA: Insufficient documentation

## 2019-09-01 DIAGNOSIS — Z95828 Presence of other vascular implants and grafts: Secondary | ICD-10-CM

## 2019-09-01 DIAGNOSIS — E669 Obesity, unspecified: Secondary | ICD-10-CM | POA: Insufficient documentation

## 2019-09-01 DIAGNOSIS — E785 Hyperlipidemia, unspecified: Secondary | ICD-10-CM | POA: Diagnosis not present

## 2019-09-01 DIAGNOSIS — I700 Atherosclerosis of aorta: Secondary | ICD-10-CM | POA: Diagnosis not present

## 2019-09-01 DIAGNOSIS — Z8249 Family history of ischemic heart disease and other diseases of the circulatory system: Secondary | ICD-10-CM | POA: Insufficient documentation

## 2019-09-01 DIAGNOSIS — Z95820 Peripheral vascular angioplasty status with implants and grafts: Secondary | ICD-10-CM

## 2019-09-01 DIAGNOSIS — Z9221 Personal history of antineoplastic chemotherapy: Secondary | ICD-10-CM | POA: Insufficient documentation

## 2019-09-01 DIAGNOSIS — L03115 Cellulitis of right lower limb: Secondary | ICD-10-CM | POA: Diagnosis not present

## 2019-09-01 DIAGNOSIS — D390 Neoplasm of uncertain behavior of uterus: Secondary | ICD-10-CM | POA: Insufficient documentation

## 2019-09-01 DIAGNOSIS — N939 Abnormal uterine and vaginal bleeding, unspecified: Secondary | ICD-10-CM | POA: Diagnosis not present

## 2019-09-01 DIAGNOSIS — Z823 Family history of stroke: Secondary | ICD-10-CM | POA: Insufficient documentation

## 2019-09-01 DIAGNOSIS — I2699 Other pulmonary embolism without acute cor pulmonale: Secondary | ICD-10-CM | POA: Insufficient documentation

## 2019-09-01 DIAGNOSIS — R296 Repeated falls: Secondary | ICD-10-CM | POA: Diagnosis not present

## 2019-09-01 DIAGNOSIS — Z7901 Long term (current) use of anticoagulants: Secondary | ICD-10-CM | POA: Diagnosis not present

## 2019-09-01 DIAGNOSIS — L539 Erythematous condition, unspecified: Secondary | ICD-10-CM | POA: Insufficient documentation

## 2019-09-01 DIAGNOSIS — N858 Other specified noninflammatory disorders of uterus: Secondary | ICD-10-CM

## 2019-09-01 DIAGNOSIS — D4989 Neoplasm of unspecified behavior of other specified sites: Secondary | ICD-10-CM

## 2019-09-01 DIAGNOSIS — Z8261 Family history of arthritis: Secondary | ICD-10-CM | POA: Insufficient documentation

## 2019-09-01 LAB — CYTOLOGY - PAP: HPV: NOT DETECTED

## 2019-09-01 NOTE — Patient Instructions (Signed)
We will contact you with the results of your biopsy from today.  You will also be scheduled for a CT scan of the chest, abdomen, and pelvis. Based on the results, we may refer you to meet with Dr. Ivor Messier, Medical Oncologist.  We will also arrange for you to have a CBC to check your blood count.

## 2019-09-01 NOTE — Progress Notes (Signed)
Consult Note: Gyn-Onc  Consult was requested by Dr. Truitt Merle  for the evaluation of Tiffany Arnold 78 y.o. female  CC:  Chief Complaint  Patient presents with  . Uterine mass    Assessment/Plan:  [name] is a 78 y.o.  With a history of stage IIB breast cancer dx and treated in 2016 with subsequent maintenance tamoxifen.  Recent dx of extensive RLE DVT and treatment with anticoagulation exacerbated recent vagina bleeding.  Physical examination c/w friable mass within the cervix, originating from the endometrium.    CBC  CT chest abd/pelvis to assess extent of disease and guide treatment planning.  Will f/u with Tiffany Arnold with findings and recommendations. F/U 09/27/2019   HPI: [name] is a 78 y.o.  G2P2 reports vaginal spotting for a few months.  Tiffany Arnold  diagnosed with RLE DVT 08/17/2019 extending into the pelvis and diagnosed with phlegmasia and was admitted for emergent thrombolysis and IVC filter placement.  She was treated with heparin and discharged home on Eliquis.  Vaginal bleeding increased when anticoagulation was initiated.  She was seen by PCP and a pap was collected 08/24/2019  c/w adenocarcinoma, favour endometrial origin.    Pelvic UTZ 08/25/2019:  Uterus 8.9x5.8x7.0.  A 6.9x7.0cm heterogeneous mass is  in noted in the region of the uterine fundus.  Additionally there is an area of increased echogenicity within the superior aspect of the vagina near the cervix which may represent a second separate mass lesion.  The endometrium was obscured by the underlying heterogeneous mass.    There was significant  vaginal bleeding at the time of pap and ultrasound.    Her history is notable for stage IIB breast cancer dx 2016 treated with partial segmentectomy, XRT and adjuvant chemotherapy. She has been on tamoxifen maintenance since completion of chemotherrapy.     Review of Systems:  Constitutional : feels tired Cardiovascular  No chest  pain, shortness of breath  Pulmonary  No cough or wheeze.  Gastro Intestinal  No nausea, vomitting, or diarrhoea. No bright red blood per rectum, no abdominal pain, change in bowel movement, or constipation.  Genito Urinary  No frequency, urgency, dysuria,intermittent heavy vaginal bleeding Musculo Skeletal  No myalgia, bilateral  joint swelling and pain, RLE edema, improving. Denies falls. Neurologic  No weakness, numbness, change in gait,  Psychology  No depression, anxiety, insomnia.    Current Meds:  Outpatient Encounter Medications as of 09/01/2019  Medication Sig  . acetaminophen (TYLENOL) 500 MG tablet Take 1,000 mg by mouth every 6 (six) hours as needed for moderate pain or headache.   Marland Kitchen apixaban (ELIQUIS) 5 MG TABS tablet Take 1 tablet (5 mg total) by mouth 2 (two) times daily.  . cetirizine (ZYRTEC) 10 MG tablet Take 10 mg by mouth every evening.   . Cholecalciferol (VITAMIN D-3) 125 MCG (5000 UT) TABS Take 5,000 Units by mouth daily.  Marland Kitchen exemestane (AROMASIN) 25 MG tablet Take 1 tablet (25 mg total) by mouth daily.  . ferrous sulfate 325 (65 FE) MG tablet Take 325 mg by mouth daily with breakfast.  . glipiZIDE (GLUCOTROL) 5 MG tablet Take 1 tablet (5 mg total) by mouth daily before breakfast.  . lisinopril (ZESTRIL) 20 MG tablet Take 1 tablet (20 mg total) by mouth daily.  . memantine (NAMENDA) 10 MG tablet TAKE ONE TABLET TWICE DAILY  . Probiotic Product (PROBIOTIC PO) Take 1 capsule by mouth daily.   . rosuvastatin (CRESTOR) 10  MG tablet Take 1 tablet (10 mg total) by mouth daily.  . sertraline (ZOLOFT) 100 MG tablet Take 1 tablet (100 mg total) by mouth daily.  . [DISCONTINUED] alendronate (FOSAMAX) 70 MG tablet TAKE 1 TABLET EVERY 7 DAYS WITH A FULL GLASS OF WATER ON AN EMPTY STOMACH DO NOT LIE DOWN FOR AT LEAST 30 MIN (Patient taking differently: Take 70 mg by mouth every Tuesday. )  . [DISCONTINUED] apixaban (ELIQUIS) 5 MG TABS tablet Take 2 tablets (10 mg total) by  mouth 2 (two) times daily for 6 days.  . [DISCONTINUED] oxyCODONE-acetaminophen (PERCOCET/ROXICET) 5-325 MG tablet Take 1 tablet by mouth every 6 (six) hours as needed for moderate pain.   No facility-administered encounter medications on file as of 09/01/2019.     Allergy:  Allergies  Allergen Reactions  . Other Shortness Of Breath, Swelling and Other (See Comments)    Cats, dogs, mold Induced asthma Cats, dogs, mold    Social Hx:   Social History   Socioeconomic History  . Marital status: Married    Spouse name: Not on file  . Number of children: 2  . Years of education: 2 years college  . Highest education level: Not on file  Occupational History  . Occupation: Retired  Scientific laboratory technician  . Financial resource strain: Not on file  . Food insecurity    Worry: Not on file    Inability: Not on file  . Transportation needs    Medical: Not on file    Non-medical: Not on file  Tobacco Use  . Smoking status: Former Smoker    Packs/day: 2.00    Years: 25.00    Pack years: 50.00    Quit date: 12/23/1991    Years since quitting: 27.7  . Smokeless tobacco: Never Used  Substance and Sexual Activity  . Alcohol use: Not Currently  . Drug use: No  . Sexual activity: Not Currently  Lifestyle  . Physical activity    Days per week: Not on file    Minutes per session: Not on file  . Stress: Not on file  Relationships  . Social Herbalist on phone: Not on file    Gets together: Not on file    Attends religious service: Not on file    Active member of club or organization: Not on file    Attends meetings of clubs or organizations: Not on file    Relationship status: Not on file  . Intimate partner violence    Fear of current or ex partner: Not on file    Emotionally abused: Not on file    Physically abused: Not on file    Forced sexual activity: Not on file  Other Topics Concern  . Not on file  Social History Narrative   Lives at home with her husband.    Right-handed.   Occasional caffeine use.    Past Surgical Hx:  Past Surgical History:  Procedure Laterality Date  . BREAST CYST EXCISION Right 2001   negative  . BREAST LUMPECTOMY WITH NEEDLE LOCALIZATION AND AXILLARY LYMPH NODE DISSECTION Right 07/29/2016   Procedure: RIGHT BREAST LUMPECTOMY WITH DOUBLE NEEDLE LOCALIZATION AND COMPLETE RIGHT AXILLARY LYMPH NODE DISSECTION;  Surgeon: Fanny Skates, MD;  Location: [place];  Service: General;  Laterality: Right;  . BREAST SURGERY Left 2000   Biopsy  . BUNIONECTOMY Bilateral 1998   great toe fusion on right foot  . CATARACT EXTRACTION W/ INTRAOCULAR LENS  IMPLANT, BILATERAL  right,  fall 2019;  left 2017  . COLONOSCOPY W/ POLYPECTOMY    . LUMBAR SPINE SURGERY  1970s  . NASAL SINUS SURGERY  2015  . ORIF HUMERUS FRACTURE Left 01/27/2019   Procedure: OPEN REDUCTION INTERNAL FIXATION (ORIF) LEFT HUMERUS;  Surgeon: Justice Britain, MD;  Location[facility];  Service: Orthopedics;  Laterality: Left;  . PERIPHERAL VASCULAR THROMBECTOMY Right 08/15/2019   Procedure: PERIPHERAL VASCULAR THROMBECTOMY WITH IVC FILTER;  Surgeon: Katha Cabal, MD;  Location: [place] [facility];  Service: Cardiovascular;  Laterality: Right;  . PORTACATH PLACEMENT N/A 09/05/2016   Procedure: INSERTION PORT-A-CATH;  Surgeon: Fanny Skates, MD;  [facility];  Service: General;  Laterality: N/A;  . TOTAL KNEE ARTHROPLASTY Bilateral 2007  . TOTAL SHOULDER REPLACEMENT Left 2011  . UMBILICAL HERNIA REPAIR  04-06-2014   '@[facility]'$     Past Medical Hx:  Past Medical History:  Diagnosis Date  . Allergic rhinitis, seasonal   . Arm bruise    left arm down to hand from fall 01-05-2019  . Bilateral renal cysts    simple (monitored by dr Karsten Ro)  . Chronic cystitis    urologist-- dr Karsten Ro  . GAD (generalized anxiety disorder)   . History of cancer chemotherapy 09-09-2016  to 11-12-2016   breast cancer  . History of external beam radiation therapy 12-24-2016  to 02-03-2017    right breast  . History of recurrent UTIs   . Humerus fracture 01/05/2019   left ,  hx total shoulder arthroplasty   . Hyperlipidemia   . Hypertension   . IBS (irritable bowel syndrome)    mixed  . Lymphedema of right upper extremity   . 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 Memory disorder 06/22/2017   with intermittant confusion;  neurologist-- dr Krista Blue (note in epic)  . Mild persistent asthma    no inhaler  . OA (osteoarthritis)   . OSA on CPAP   . Type 2 diabetes, diet controlled ([place])   . Urge incontinence of urine   . Wears glasses     Past Gynecological History: Menarche 42, regular menses Menopause 50's  NO h/o abnormal pap. G2P2   Family Hx:  Family History  Problem Relation Age of Onset  . Arthritis Mother   . Stroke Mother   . Hypertension Mother   . Cancer Father        Prostate  . Stroke Father   . Hypertension Father   . Hypertension Maternal Grandmother   . Rheum arthritis Maternal Grandfather   . Stroke Maternal Grandfather   . Hypertension Maternal Grandfather   . Cancer Paternal Grandmother        Colon  . Hypertension Paternal Grandmother   . Hypertension Paternal Grandfather   . Breast cancer Neg Hx   Brother endometrial cancer Dx 29 years old  Vitals:  Blood pressure (!) 156/73, pulse 86, temperature 98.9 F (37.2 C), temperature source Temporal, height 5' (1.524 m), weight 247 lb (112 kg), SpO2 100 %.  Physical Exam: WD in NAD Neck  Supple NROM, without any enlargements.  Lymph Node Survey No cervical supraclavicular or inguinal adenopathy Cardiovascular  Pulse normal rate, regularity  and rhythm.  Lungs  Clear to auscultation bilaterally, without wheezes/crackles/rhonchi. Good air movement.  Skin  No rash/lesions/breakdown  Psychiatry  Alert and oriented appropriate mood affect speech and reasoning. Abdomen  Obese abdomen soft, non-tender. protuberent.  Back No CVA tenderness Genito Urinary  Vulva/vagina: Normal external female genitalia.  No lesions. Blood present at the introitus  Vagina: blood in the vaginal vault  Cervix: dilated to 4 cm with a friable mass protruding.  Blood present.  Small biopsy with resultant 150cc blood loss.  Hemostasis achieved with pressure and monsels.  Exam deferred   Uterus: [place], no parametrial involvement or nodularity.  Adnexa: No palpable masses. Rectal  deferred Extremities  RLE 4+ edema with brawny edema, blisters and erythema extending to the thigh. LLE 2+ edema  CERVICAL BIOPSY  Procedure explained to patient.  The speculum was placed and the cervix was prepped with betadine One small biopsy was  taken from presenting area within the cervix. There was significant bleeding controlled with pressure and monsels  Patient tolerated the procedure well and all instruments were removed.    Janie Morning, MD, PhD 09/01/2019, 4:35 PM

## 2019-09-02 ENCOUNTER — Encounter (INDEPENDENT_AMBULATORY_CARE_PROVIDER_SITE_OTHER): Payer: Self-pay | Admitting: Nurse Practitioner

## 2019-09-02 ENCOUNTER — Telehealth: Payer: Self-pay

## 2019-09-02 ENCOUNTER — Ambulatory Visit (INDEPENDENT_AMBULATORY_CARE_PROVIDER_SITE_OTHER): Payer: Medicare PPO

## 2019-09-02 ENCOUNTER — Ambulatory Visit (INDEPENDENT_AMBULATORY_CARE_PROVIDER_SITE_OTHER): Payer: Medicare PPO | Admitting: Nurse Practitioner

## 2019-09-02 VITALS — BP 168/71 | HR 91 | Resp 18 | Ht 60.0 in | Wt 243.0 lb

## 2019-09-02 DIAGNOSIS — I10 Essential (primary) hypertension: Secondary | ICD-10-CM

## 2019-09-02 DIAGNOSIS — Z95820 Peripheral vascular angioplasty status with implants and grafts: Secondary | ICD-10-CM

## 2019-09-02 DIAGNOSIS — Z95828 Presence of other vascular implants and grafts: Secondary | ICD-10-CM

## 2019-09-02 DIAGNOSIS — I82401 Acute embolism and thrombosis of unspecified deep veins of right lower extremity: Secondary | ICD-10-CM | POA: Diagnosis not present

## 2019-09-02 DIAGNOSIS — E119 Type 2 diabetes mellitus without complications: Secondary | ICD-10-CM | POA: Diagnosis not present

## 2019-09-02 LAB — CBC WITH DIFFERENTIAL (CANCER CENTER ONLY)
Abs Immature Granulocytes: 0.01 10*3/uL (ref 0.00–0.07)
Basophils Absolute: 0 10*3/uL (ref 0.0–0.1)
Basophils Relative: 0 %
Eosinophils Absolute: 0.1 10*3/uL (ref 0.0–0.5)
Eosinophils Relative: 2 %
HCT: 39.2 % (ref 36.0–46.0)
Hemoglobin: 10.9 g/dL — ABNORMAL LOW (ref 12.0–15.0)
Immature Granulocytes: 0 %
Lymphocytes Relative: 16 %
Lymphs Abs: 1.2 10*3/uL (ref 0.7–4.0)
MCH: 28.8 pg (ref 26.0–34.0)
MCHC: 27.8 g/dL — ABNORMAL LOW (ref 30.0–36.0)
MCV: 103.7 fL — ABNORMAL HIGH (ref 80.0–100.0)
Monocytes Absolute: 0.7 10*3/uL (ref 0.1–1.0)
Monocytes Relative: 9 %
Neutro Abs: 5.4 10*3/uL (ref 1.7–7.7)
Neutrophils Relative %: 73 %
Platelet Count: 333 10*3/uL (ref 150–400)
RBC: 3.78 MIL/uL — ABNORMAL LOW (ref 3.87–5.11)
RDW: 16 % — ABNORMAL HIGH (ref 11.5–15.5)
WBC Count: 7.4 10*3/uL (ref 4.0–10.5)
nRBC: 0 % (ref 0.0–0.2)

## 2019-09-02 NOTE — Telephone Encounter (Signed)
Told sister that Tiffany Arnold's Hemoglobin yesterday was up to 10.9 from 9.7 3 weeks ago.

## 2019-09-03 ENCOUNTER — Other Ambulatory Visit: Payer: Self-pay | Admitting: Internal Medicine

## 2019-09-04 NOTE — Progress Notes (Signed)
SUBJECTIVE:  Patient ID: Tiffany Arnold, female    DOB: [birth year], 78 y.o.   MRN: [id number] Chief Complaint  Patient presents with  . Follow-up    [facility] 2week ultrasound    HPI  Tiffany Arnold is a 78 y.o. 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  The patient underwent placement of an IVC filter with thrombectomy of the right common femoral right external iliac and right common iliac vein.  There was also a PTA and stent placement to the right common iliac vein.  It was also noted that during the procedure the left common iliac had some stenosis as well.  There was also thrombus within the femoral and popliteal vein.  Subsequently after the patient's procedure she noticed that she had some vaginal bleeding and presented to her primary care provider's office where a mass was noted on her cervix.  Subsequent evaluation by an oncologist noted that the patient has a large tumor within her uterus and there is concerned that it may have spread to her lymph nodes at this time.  The oncologist feels that the extensive DVT may have been caused by the tumor.  The patient is scheduled to go for follow-up testing in order to determine what further treatment and steps may be necessary at this point in time.  Patient has been started on Eliquis with little issue.  The patient continues to have some right lower extremity leg pain and swelling however it is beginning to decrease at this time.  Today noninvasive studies show no evidence of DVT within the common femoral vein.  There is still a acute DVT involving the right femoral vein and right popliteal vein.  Previously placed stents in the iliac veins are patent.  Past Medical History:  Diagnosis Date  . Allergic rhinitis, seasonal   . Arm bruise    left arm down to hand from fall 01-05-2019  . Bilateral renal cysts    simple (monitored by dr Karsten Ro)   . Chronic cystitis    urologist-- dr Karsten Ro  . GAD (generalized anxiety disorder)   . History of cancer chemotherapy 09-09-2016  to 11-12-2016   breast cancer  . History of external beam radiation therapy 12-24-2016  to 02-03-2017   right breast  . History of recurrent UTIs   . Humerus fracture 01/05/2019   left ,  hx total shoulder arthroplasty   . Hyperlipidemia   . Hypertension   . IBS (irritable bowel syndrome)    mixed  . Lymphedema of right upper extremity   . 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 Memory disorder 06/22/2017   with intermittant confusion;  neurologist-- dr Krista Blue (note in epic)  . Mild persistent asthma    no inhaler  . OA (osteoarthritis)   . OSA on CPAP   . Type 2 diabetes, diet controlled ([place])   . Urge incontinence of urine   . Wears glasses     Past Surgical History:  Procedure Laterality Date  . BREAST CYST EXCISION Right 2001   negative  . BREAST LUMPECTOMY WITH  NEEDLE LOCALIZATION AND AXILLARY LYMPH NODE DISSECTION Right 07/29/2016   Procedure: RIGHT BREAST LUMPECTOMY WITH DOUBLE NEEDLE LOCALIZATION AND COMPLETE RIGHT AXILLARY LYMPH NODE DISSECTION;  Surgeon: Fanny Skates, MD;  Location: [place];  Service: General;  Laterality: Right;  . BREAST SURGERY Left 2000   Biopsy  . BUNIONECTOMY Bilateral 1998   great toe fusion on right foot  . CATARACT EXTRACTION W/ INTRAOCULAR LENS  IMPLANT, BILATERAL  right,  fall 2019;  left 2017  . COLONOSCOPY W/ POLYPECTOMY    . LUMBAR SPINE SURGERY  1970s  . NASAL SINUS SURGERY  2015  . ORIF HUMERUS FRACTURE Left 01/27/2019   Procedure: OPEN REDUCTION INTERNAL  FIXATION (ORIF) LEFT HUMERUS;  Surgeon: Justice Britain, MD;  [facility];  Service: Orthopedics;  Laterality: Left;  . PERIPHERAL VASCULAR THROMBECTOMY Right 08/15/2019   Procedure: PERIPHERAL VASCULAR THROMBECTOMY WITH IVC FILTER;  Surgeon: Katha Cabal, MD;  Location: [place] [facility];  Service: Cardiovascular;  Laterality: Right;  . PORTACATH PLACEMENT N/A 09/05/2016   Procedure: INSERTION PORT-A-CATH;  Surgeon: Fanny Skates, MD;  [facility];  Service: General;  Laterality: N/A;  . TOTAL KNEE ARTHROPLASTY Bilateral 2007  . TOTAL SHOULDER REPLACEMENT Left 2011  . UMBILICAL HERNIA REPAIR  04-06-2014   _0     Social History   Socioeconomic History  . Marital status: Married    Spouse name: Not on file  . Number of children: 2  . Years of education: 2 years college  . Highest education level: Not on file  Occupational History  . Occupation: Retired  Scientific laboratory technician  . Financial resource strain: Not on file  . Food insecurity    Worry: Not on file    Inability: Not on file  . Transportation needs    Medical: Not on file    Non-medical: Not on file  Tobacco Use  . Smoking status: Former Smoker    Packs/day: 2.00    Years: 25.00    Pack years: 50.00    Quit date: 12/23/1991    Years since quitting: 27.7  . Smokeless tobacco: Never Used  Substance and Sexual Activity  . Alcohol use: Not Currently  . Drug use: No  . Sexual activity: Not Currently  Lifestyle  . Physical activity    Days per week: Not on file    Minutes per session: Not on file  . Stress: Not on file  Relationships  . Social Herbalist on phone: Not on file    Gets together: Not on file    Attends religious service: Not on file    Active member of club or organization: Not on file    Attends meetings of clubs or organizations: Not on file    Relationship status: Not on file  . Intimate partner violence    Fear of current or ex partner: Not on file    Emotionally abused: Not on  file    Physically abused: Not on file    Forced sexual activity: Not on file  Other Topics Concern  . Not on file  Social History Narrative   Lives at home with her husband.   Right-handed.   Occasional caffeine use.    Family History  Problem Relation Age of Onset  . Arthritis Mother   . Stroke Mother   . Hypertension Mother   . Cancer Father        Prostate  . Stroke Father   . Hypertension Father   . Hypertension  Maternal Grandmother   . Rheum arthritis Maternal Grandfather   . Stroke Maternal Grandfather   . Hypertension Maternal Grandfather   . Cancer Paternal Grandmother        Colon  . Hypertension Paternal Grandmother   . Hypertension Paternal Grandfather   . Breast cancer Neg Hx     Allergies  Allergen Reactions  . Other Shortness Of Breath, Swelling and Other (See Comments)    Cats, dogs, mold Induced asthma Cats, dogs, mold     Review of Systems   Review of Systems: Negative Unless Checked Constitutional: _0 Weight loss  _1 Fever  _2 Chills Cardiac: _3 Chest pain   _4  Atrial Fibrillation  _5 Palpitations   _6 Shortness of breath when laying flat   _7 Shortness of breath with exertion. _8 Shortness of breath at rest Vascular:  _9 Pain in legs with walking   _10 Pain in legs with standing _11 Pain in legs when laying flat   _12 Claudication    _13 Pain in feet when laying flat    _14 History of DVT   _15 Phlebitis   _16 Swelling in legs   _17 Varicose veins   _18 Non-healing ulcers Pulmonary:   _19 Uses home oxygen   _20 Productive cough   _21 Hemoptysis   _22 Wheeze  _23 COPD   _24 Asthma Neurologic:  _25 Dizziness   _26 Seizures  _27 Blackouts _28 History of stroke   _29 History of TIA  _30 Aphasia   _31 Temporary Blindness   _32 Weakness or numbness in arm   _33 Weakness or numbness in leg Musculoskeletal:   _34 Joint swelling   _35 Joint pain   _36 Low back pain  _37  History of Knee Replacement _38 Arthritis _39 back Surgeries  _40  Spinal Stenosis    Hematologic:  _41 Easy bruising  _42 Easy bleeding   _43 Hypercoagulable  state   _44 Anemic Gastrointestinal:  _45 Diarrhea   _46 Vomiting  _47 Gastroesophageal reflux/heartburn   _48 Difficulty swallowing. _49 Abdominal pain Genitourinary:  _50 Chronic kidney disease   _51 Difficult urination  _52 Anuric   _53 Blood in urine _54 Frequent urination  _55 Burning with urination   _56 Hematuria Skin:  _57 Rashes   _58 Ulcers _59 Wounds Psychological:  _60 History of anxiety   _61  History of major depression  _62  Memory Difficulties      OBJECTIVE:   Physical Exam  BP (!) 168/71 (BP Location: Left Arm)   Pulse 91   Resp 18   Ht 5' (1.524 m)   Wt 243 lb (110.2 kg)   BMI 47.46 kg/m   Gen: WD/WN, NAD Head: [name]/AT, No temporalis wasting.  Ear/Nose/Throat: Hearing grossly intact, nares w/o erythema or drainage Eyes: PER, EOMI, sclera nonicteric.  Neck: Supple, no masses.  No JVD.  Pulmonary:  Good air movement, no use of accessory muscles.  Cardiac: RRR Vascular:  3+ edema right lower extremity.  Hard to palpate pulses due to edema Vessel Right Left  Radial Palpable Palpable   Gastrointestinal: soft, non-distended. No guarding/no peritoneal signs.  Musculoskeletal: Patient using walker for ambulation No deformity or atrophy.  Neurologic: Pain and light touch intact in extremities.  Symmetrical.  Speech is fluent. Motor exam as listed above. Psychiatric: Judgment intact, Mood & affect appropriate for pt's clinical situation. Dermatologic: No Venous rashes. No Ulcers Noted.  No changes consistent with cellulitis. Lymph : No Cervical lymphadenopathy, no lichenification or skin changes of chronic lymphedema.       ASSESSMENT AND PLAN:  1. Acute deep vein thrombosis (DVT) of right lower extremity, unspecified vein ([place]) I had a long discussion with the patient as well as her sister and due to the fact that the patient has just found out about this recent cancer and there is a  number of upcoming tests to assess the spread as well as possible treatment options, it was felt that it was in the best  interest of the patient to leave her IVC filter in place at this time.  The patient continues to have an acute thrombus within her right lower extremity however after discussing the natural progression of thrombus with the patient and her sister it was decided that the patient would not undergo thrombectomy at this time and treated with current anticoagulation therapy.  We discussed the need to treat the stenosis found within the left iliac vein however we will have the patient return the office in 6 weeks following her other studies to talk about moving forward with venogram.  I also discussed with the patient that if there is monitoring of the lymph nodes it could possibly be causing compression of that iliac vein and if there is an acute compression she would expect there to be significant pain and swelling of her left leg much like it was the right.  If that should happen she is to seek emergency attention immediately for treatment.  Otherwise we will have the patient return to the office in 6 weeks.  2. Essential hypertension Continue antihypertensive medications as already ordered, these medications have been reviewed and there are no changes at this time.   3. Type 2 diabetes mellitus without complication, without long-term current use of insulin (HCC) Continue hypoglycemic medications as already ordered, these medications have been reviewed and there are no changes at this time.  Hgb A1C to be monitored as already arranged by primary service    Current Outpatient Medications on File Prior to Visit  Medication Sig Dispense Refill  . acetaminophen (TYLENOL) 500 MG tablet Take 1,000 mg by mouth every 6 (six) hours as needed for moderate pain or headache.     Marland Kitchen apixaban (ELIQUIS) 5 MG TABS tablet Take 1 tablet (5 mg total) by mouth 2 (two) times daily. 60 tablet 1  . cetirizine (ZYRTEC) 10 MG tablet Take 10 mg by mouth every evening.     . Cholecalciferol (VITAMIN D-3) 125 MCG (5000 UT) TABS  Take 5,000 Units by mouth daily.    Marland Kitchen exemestane (AROMASIN) 25 MG tablet Take 1 tablet (25 mg total) by mouth daily. 30 tablet 6  . ferrous sulfate 325 (65 FE) MG tablet Take 325 mg by mouth daily with breakfast.    . glipiZIDE (GLUCOTROL) 5 MG tablet Take 1 tablet (5 mg total) by mouth daily before breakfast. 30 tablet 2  . lisinopril (ZESTRIL) 20 MG tablet Take 1 tablet (20 mg total) by mouth daily. 90 tablet 3  . memantine (NAMENDA) 10 MG tablet TAKE ONE TABLET TWICE DAILY 60 tablet 5  . Probiotic Product (PROBIOTIC PO) Take 1 capsule by mouth daily.     . rosuvastatin (CRESTOR) 10 MG tablet Take 1 tablet (10 mg total) by mouth daily. 30 tablet 2  . sertraline (ZOLOFT) 100 MG tablet Take 1 tablet (100 mg total) by mouth daily. 90 tablet 3   No current facility-administered medications on file prior to visit.     There are no Patient Instructions on file for this visit. No follow-ups on file.   Kris Hartmann, NP  This note was completed with Sales executive.  Any errors are purely unintentional.

## 2019-09-05 ENCOUNTER — Other Ambulatory Visit: Payer: Medicare PPO

## 2019-09-05 DIAGNOSIS — E119 Type 2 diabetes mellitus without complications: Secondary | ICD-10-CM | POA: Diagnosis not present

## 2019-09-05 DIAGNOSIS — F411 Generalized anxiety disorder: Secondary | ICD-10-CM | POA: Diagnosis not present

## 2019-09-05 DIAGNOSIS — I824Z1 Acute embolism and thrombosis of unspecified deep veins of right distal lower extremity: Secondary | ICD-10-CM | POA: Diagnosis not present

## 2019-09-05 DIAGNOSIS — E785 Hyperlipidemia, unspecified: Secondary | ICD-10-CM | POA: Diagnosis not present

## 2019-09-05 DIAGNOSIS — C50911 Malignant neoplasm of unspecified site of right female breast: Secondary | ICD-10-CM | POA: Diagnosis not present

## 2019-09-05 DIAGNOSIS — I10 Essential (primary) hypertension: Secondary | ICD-10-CM | POA: Diagnosis not present

## 2019-09-05 DIAGNOSIS — G300 Alzheimer's disease with early onset: Secondary | ICD-10-CM | POA: Diagnosis not present

## 2019-09-05 DIAGNOSIS — F0280 Dementia in other diseases classified elsewhere without behavioral disturbance: Secondary | ICD-10-CM | POA: Diagnosis not present

## 2019-09-05 DIAGNOSIS — J4530 Mild persistent asthma, uncomplicated: Secondary | ICD-10-CM | POA: Diagnosis not present

## 2019-09-07 DIAGNOSIS — I824Z1 Acute embolism and thrombosis of unspecified deep veins of right distal lower extremity: Secondary | ICD-10-CM | POA: Diagnosis not present

## 2019-09-07 DIAGNOSIS — E785 Hyperlipidemia, unspecified: Secondary | ICD-10-CM | POA: Diagnosis not present

## 2019-09-07 DIAGNOSIS — F0280 Dementia in other diseases classified elsewhere without behavioral disturbance: Secondary | ICD-10-CM | POA: Diagnosis not present

## 2019-09-07 DIAGNOSIS — E119 Type 2 diabetes mellitus without complications: Secondary | ICD-10-CM | POA: Diagnosis not present

## 2019-09-07 DIAGNOSIS — C50911 Malignant neoplasm of unspecified site of right female breast: Secondary | ICD-10-CM | POA: Diagnosis not present

## 2019-09-07 DIAGNOSIS — F411 Generalized anxiety disorder: Secondary | ICD-10-CM | POA: Diagnosis not present

## 2019-09-07 DIAGNOSIS — I10 Essential (primary) hypertension: Secondary | ICD-10-CM | POA: Diagnosis not present

## 2019-09-07 DIAGNOSIS — G300 Alzheimer's disease with early onset: Secondary | ICD-10-CM | POA: Diagnosis not present

## 2019-09-07 DIAGNOSIS — J4530 Mild persistent asthma, uncomplicated: Secondary | ICD-10-CM | POA: Diagnosis not present

## 2019-09-08 ENCOUNTER — Other Ambulatory Visit: Payer: Self-pay

## 2019-09-08 ENCOUNTER — Emergency Department (HOSPITAL_COMMUNITY): Payer: Medicare PPO

## 2019-09-08 ENCOUNTER — Ambulatory Visit (HOSPITAL_COMMUNITY)
Admission: RE | Admit: 2019-09-08 | Discharge: 2019-09-08 | Disposition: A | Discharge status: Home / Self Care | Payer: Medicare PPO | Source: Ambulatory Visit | Attending: Gynecologic Oncology | Admitting: Gynecologic Oncology

## 2019-09-08 ENCOUNTER — Inpatient Hospital Stay (HOSPITAL_COMMUNITY)
Admission: EM | Admit: 2019-09-08 | Discharge: 2019-09-12 | DRG: 176 | Disposition: A | Discharge status: Home / Self Care | Payer: Medicare PPO | Attending: Internal Medicine | Admitting: Internal Medicine

## 2019-09-08 ENCOUNTER — Other Ambulatory Visit (HOSPITAL_COMMUNITY): Payer: Self-pay

## 2019-09-08 DIAGNOSIS — Z8261 Family history of arthritis: Secondary | ICD-10-CM | POA: Diagnosis not present

## 2019-09-08 DIAGNOSIS — Z853 Personal history of malignant neoplasm of breast: Secondary | ICD-10-CM | POA: Diagnosis not present

## 2019-09-08 DIAGNOSIS — Z923 Personal history of irradiation: Secondary | ICD-10-CM | POA: Diagnosis not present

## 2019-09-08 DIAGNOSIS — Z96612 Presence of left artificial shoulder joint: Secondary | ICD-10-CM | POA: Diagnosis present

## 2019-09-08 DIAGNOSIS — I10 Essential (primary) hypertension: Secondary | ICD-10-CM | POA: Diagnosis present

## 2019-09-08 DIAGNOSIS — Z20828 Contact with and (suspected) exposure to other viral communicable diseases: Secondary | ICD-10-CM | POA: Diagnosis present

## 2019-09-08 DIAGNOSIS — Z961 Presence of intraocular lens: Secondary | ICD-10-CM | POA: Diagnosis present

## 2019-09-08 DIAGNOSIS — Z87440 Personal history of urinary (tract) infections: Secondary | ICD-10-CM

## 2019-09-08 DIAGNOSIS — Z86718 Personal history of other venous thrombosis and embolism: Secondary | ICD-10-CM | POA: Diagnosis not present

## 2019-09-08 DIAGNOSIS — I361 Nonrheumatic tricuspid (valve) insufficiency: Secondary | ICD-10-CM | POA: Diagnosis not present

## 2019-09-08 DIAGNOSIS — S0990XA Unspecified injury of head, initial encounter: Secondary | ICD-10-CM | POA: Diagnosis not present

## 2019-09-08 DIAGNOSIS — Z9989 Dependence on other enabling machines and devices: Secondary | ICD-10-CM

## 2019-09-08 DIAGNOSIS — C779 Secondary and unspecified malignant neoplasm of lymph node, unspecified: Secondary | ICD-10-CM | POA: Diagnosis present

## 2019-09-08 DIAGNOSIS — Z87891 Personal history of nicotine dependence: Secondary | ICD-10-CM

## 2019-09-08 DIAGNOSIS — E785 Hyperlipidemia, unspecified: Secondary | ICD-10-CM | POA: Diagnosis present

## 2019-09-08 DIAGNOSIS — E669 Obesity, unspecified: Secondary | ICD-10-CM | POA: Diagnosis present

## 2019-09-08 DIAGNOSIS — I2692 Saddle embolus of pulmonary artery without acute cor pulmonale: Principal | ICD-10-CM | POA: Diagnosis present

## 2019-09-08 DIAGNOSIS — Z9841 Cataract extraction status, right eye: Secondary | ICD-10-CM

## 2019-09-08 DIAGNOSIS — R0602 Shortness of breath: Secondary | ICD-10-CM | POA: Diagnosis not present

## 2019-09-08 DIAGNOSIS — R1900 Intra-abdominal and pelvic swelling, mass and lump, unspecified site: Secondary | ICD-10-CM | POA: Diagnosis not present

## 2019-09-08 DIAGNOSIS — Z9221 Personal history of antineoplastic chemotherapy: Secondary | ICD-10-CM

## 2019-09-08 DIAGNOSIS — D630 Anemia in neoplastic disease: Secondary | ICD-10-CM | POA: Diagnosis present

## 2019-09-08 DIAGNOSIS — F411 Generalized anxiety disorder: Secondary | ICD-10-CM | POA: Diagnosis present

## 2019-09-08 DIAGNOSIS — E7800 Pure hypercholesterolemia, unspecified: Secondary | ICD-10-CM | POA: Diagnosis not present

## 2019-09-08 DIAGNOSIS — K5900 Constipation, unspecified: Secondary | ICD-10-CM

## 2019-09-08 DIAGNOSIS — Z6841 Body Mass Index (BMI) 40.0 and over, adult: Secondary | ICD-10-CM

## 2019-09-08 DIAGNOSIS — G4733 Obstructive sleep apnea (adult) (pediatric): Secondary | ICD-10-CM | POA: Diagnosis not present

## 2019-09-08 DIAGNOSIS — E119 Type 2 diabetes mellitus without complications: Secondary | ICD-10-CM

## 2019-09-08 DIAGNOSIS — I2699 Other pulmonary embolism without acute cor pulmonale: Secondary | ICD-10-CM | POA: Diagnosis not present

## 2019-09-08 DIAGNOSIS — Z800 Family history of malignant neoplasm of digestive organs: Secondary | ICD-10-CM | POA: Diagnosis not present

## 2019-09-08 DIAGNOSIS — R413 Other amnesia: Secondary | ICD-10-CM | POA: Diagnosis present

## 2019-09-08 DIAGNOSIS — D4989 Neoplasm of unspecified behavior of other specified sites: Secondary | ICD-10-CM

## 2019-09-08 DIAGNOSIS — Z7984 Long term (current) use of oral hypoglycemic drugs: Secondary | ICD-10-CM

## 2019-09-08 DIAGNOSIS — M1990 Unspecified osteoarthritis, unspecified site: Secondary | ICD-10-CM | POA: Diagnosis present

## 2019-09-08 DIAGNOSIS — Z9842 Cataract extraction status, left eye: Secondary | ICD-10-CM

## 2019-09-08 DIAGNOSIS — Z79899 Other long term (current) drug therapy: Secondary | ICD-10-CM

## 2019-09-08 DIAGNOSIS — Z823 Family history of stroke: Secondary | ICD-10-CM | POA: Diagnosis not present

## 2019-09-08 DIAGNOSIS — J45909 Unspecified asthma, uncomplicated: Secondary | ICD-10-CM | POA: Diagnosis not present

## 2019-09-08 DIAGNOSIS — Z96653 Presence of artificial knee joint, bilateral: Secondary | ICD-10-CM | POA: Diagnosis present

## 2019-09-08 DIAGNOSIS — N950 Postmenopausal bleeding: Secondary | ICD-10-CM | POA: Diagnosis present

## 2019-09-08 DIAGNOSIS — W19XXXA Unspecified fall, initial encounter: Secondary | ICD-10-CM | POA: Diagnosis present

## 2019-09-08 DIAGNOSIS — Z95820 Peripheral vascular angioplasty status with implants and grafts: Secondary | ICD-10-CM

## 2019-09-08 DIAGNOSIS — Z7901 Long term (current) use of anticoagulants: Secondary | ICD-10-CM

## 2019-09-08 DIAGNOSIS — R609 Edema, unspecified: Secondary | ICD-10-CM

## 2019-09-08 DIAGNOSIS — E11649 Type 2 diabetes mellitus with hypoglycemia without coma: Secondary | ICD-10-CM | POA: Diagnosis present

## 2019-09-08 DIAGNOSIS — Z8249 Family history of ischemic heart disease and other diseases of the circulatory system: Secondary | ICD-10-CM

## 2019-09-08 DIAGNOSIS — I272 Pulmonary hypertension, unspecified: Secondary | ICD-10-CM | POA: Diagnosis present

## 2019-09-08 DIAGNOSIS — D500 Iron deficiency anemia secondary to blood loss (chronic): Secondary | ICD-10-CM | POA: Diagnosis present

## 2019-09-08 DIAGNOSIS — D259 Leiomyoma of uterus, unspecified: Secondary | ICD-10-CM | POA: Diagnosis not present

## 2019-09-08 DIAGNOSIS — C541 Malignant neoplasm of endometrium: Secondary | ICD-10-CM | POA: Diagnosis present

## 2019-09-08 LAB — COMPREHENSIVE METABOLIC PANEL
ALT: 28 U/L (ref 0–44)
AST: 28 U/L (ref 15–41)
Albumin: 2.7 g/dL — ABNORMAL LOW (ref 3.5–5.0)
Alkaline Phosphatase: 83 U/L (ref 38–126)
Anion gap: 10 (ref 5–15)
BUN: 13 mg/dL (ref 8–23)
CO2: 28 mmol/L (ref 22–32)
Calcium: 8.9 mg/dL (ref 8.9–10.3)
Chloride: 102 mmol/L (ref 98–111)
Creatinine, Ser: 0.66 mg/dL (ref 0.44–1.00)
GFR calc Af Amer: >60 mL/min (ref >60)
GFR calc non Af Amer: >60 mL/min (ref >60)
Glucose, Bld: 65 mg/dL — ABNORMAL LOW (ref 70–99)
Potassium: 4 mmol/L (ref 3.5–5.1)
Sodium: 140 mmol/L (ref 135–145)
Total Bilirubin: 0.4 mg/dL (ref 0.3–1.2)
Total Protein: 7.2 g/dL (ref 6.5–8.1)

## 2019-09-08 LAB — CBC WITH DIFFERENTIAL/PLATELET
Abs Immature Granulocytes: 0.06 10*3/uL (ref 0.00–0.07)
Basophils Absolute: 0 10*3/uL (ref 0.0–0.1)
Basophils Relative: 0 %
Eosinophils Absolute: 0 10*3/uL (ref 0.0–0.5)
Eosinophils Relative: 0 %
HCT: 33.6 % — ABNORMAL LOW (ref 36.0–46.0)
Hemoglobin: 9.8 g/dL — ABNORMAL LOW (ref 12.0–15.0)
Immature Granulocytes: 1 %
Lymphocytes Relative: 10 %
Lymphs Abs: 1.1 10*3/uL (ref 0.7–4.0)
MCH: 28.2 pg (ref 26.0–34.0)
MCHC: 29.2 g/dL — ABNORMAL LOW (ref 30.0–36.0)
MCV: 96.8 fL (ref 80.0–100.0)
Monocytes Absolute: 0.7 10*3/uL (ref 0.1–1.0)
Monocytes Relative: 6 %
Neutro Abs: 10 10*3/uL — ABNORMAL HIGH (ref 1.7–7.7)
Neutrophils Relative %: 83 %
Platelets: 282 10*3/uL (ref 150–400)
RBC: 3.47 MIL/uL — ABNORMAL LOW (ref 3.87–5.11)
RDW: 15.4 % (ref 11.5–15.5)
WBC: 12 10*3/uL — ABNORMAL HIGH (ref 4.0–10.5)
nRBC: 0 % (ref 0.0–0.2)

## 2019-09-08 LAB — CBG MONITORING, ED
Glucose-Capillary: 58 mg/dL — ABNORMAL LOW (ref 70–99)
Glucose-Capillary: 81 mg/dL (ref 70–99)
Glucose-Capillary: 101 mg/dL — ABNORMAL HIGH (ref 70–99)
Glucose-Capillary: 90 mg/dL (ref 70–99)

## 2019-09-08 LAB — TROPONIN I (HIGH SENSITIVITY): Troponin I (High Sensitivity): 8 ng/L (ref <18)

## 2019-09-08 LAB — APTT: aPTT: 35 seconds (ref 24–36)

## 2019-09-08 LAB — HEPARIN LEVEL (UNFRACTIONATED): Heparin Unfractionated: >2 IU/mL — ABNORMAL HIGH (ref 0.30–0.70)

## 2019-09-08 LAB — PROTIME-INR
INR: 1.4 — ABNORMAL HIGH (ref 0.8–1.2)
Prothrombin Time: 17.3 seconds — ABNORMAL HIGH (ref 11.4–15.2)

## 2019-09-08 MED ORDER — IOHEXOL 300 MG/ML  SOLN
100.0000 mL | Freq: Once | INTRAMUSCULAR | Status: AC | PRN
  Administered 2019-09-08: 100 mL via INTRAVENOUS

## 2019-09-08 MED ORDER — LISINOPRIL 20 MG PO TABS
20.0000 mg | ORAL_TABLET | Freq: Every day | ORAL | Status: DC
  Administered 2019-09-09 – 2019-09-12 (×4): 20 mg via ORAL
  Filled 2019-09-08: qty 2
  Filled 2019-09-08: qty 1
  Filled 2019-09-08 (×2): qty 2

## 2019-09-08 MED ORDER — LORATADINE 10 MG PO TABS
10.0000 mg | ORAL_TABLET | Freq: Every day | ORAL | Status: DC
  Administered 2019-09-09 – 2019-09-12 (×4): 10 mg via ORAL
  Filled 2019-09-08 (×4): qty 1

## 2019-09-08 MED ORDER — ROSUVASTATIN CALCIUM 10 MG PO TABS
10.0000 mg | ORAL_TABLET | Freq: Every day | ORAL | Status: DC
  Administered 2019-09-09 – 2019-09-11 (×3): 10 mg via ORAL
  Filled 2019-09-08 (×3): qty 1

## 2019-09-08 MED ORDER — INSULIN ASPART 100 UNIT/ML ~~LOC~~ SOLN
0.0000 [IU] | Freq: Every day | SUBCUTANEOUS | Status: DC
  Filled 2019-09-08: qty 0.05

## 2019-09-08 MED ORDER — FERROUS SULFATE 325 (65 FE) MG PO TABS
325.0000 mg | ORAL_TABLET | Freq: Every day | ORAL | Status: DC
  Administered 2019-09-09 – 2019-09-12 (×4): 325 mg via ORAL
  Filled 2019-09-08 (×5): qty 1

## 2019-09-08 MED ORDER — INSULIN ASPART 100 UNIT/ML ~~LOC~~ SOLN
0.0000 [IU] | Freq: Three times a day (TID) | SUBCUTANEOUS | Status: DC
  Administered 2019-09-09: 1 [IU] via SUBCUTANEOUS
  Administered 2019-09-10: 2 [IU] via SUBCUTANEOUS
  Administered 2019-09-10 – 2019-09-12 (×3): 1 [IU] via SUBCUTANEOUS
  Filled 2019-09-08: qty 0.09

## 2019-09-08 MED ORDER — SODIUM CHLORIDE 0.9 % IV SOLN
INTRAVENOUS | Status: AC
  Administered 2019-09-09: 01:00:00 via INTRAVENOUS

## 2019-09-08 MED ORDER — ACETAMINOPHEN 650 MG RE SUPP: 650.0000 mg | Freq: Four times a day (QID) | RECTAL | Status: DC | PRN

## 2019-09-08 MED ORDER — RISAQUAD PO CAPS
ORAL_CAPSULE | Freq: Every day | ORAL | Status: DC
  Administered 2019-09-09 – 2019-09-12 (×4): 1 via ORAL
  Filled 2019-09-08 (×5): qty 1

## 2019-09-08 MED ORDER — SERTRALINE HCL 100 MG PO TABS
100.0000 mg | ORAL_TABLET | Freq: Every day | ORAL | Status: DC
  Administered 2019-09-09 – 2019-09-12 (×4): 100 mg via ORAL
  Filled 2019-09-08 (×4): qty 1

## 2019-09-08 MED ORDER — MEMANTINE HCL 10 MG PO TABS
10.0000 mg | ORAL_TABLET | Freq: Two times a day (BID) | ORAL | Status: DC
  Administered 2019-09-09 – 2019-09-12 (×7): 10 mg via ORAL
  Filled 2019-09-08: qty 2
  Filled 2019-09-08 (×2): qty 1
  Filled 2019-09-08 (×3): qty 2
  Filled 2019-09-08: qty 1
  Filled 2019-09-08: qty 2

## 2019-09-08 MED ORDER — NITROFURANTOIN MACROCRYSTAL 100 MG PO CAPS
100.0000 mg | ORAL_CAPSULE | Freq: Two times a day (BID) | ORAL | Status: DC
  Administered 2019-09-09 – 2019-09-12 (×7): 100 mg via ORAL
  Filled 2019-09-08 (×9): qty 1

## 2019-09-08 MED ORDER — ACETAMINOPHEN 325 MG PO TABS
650.0000 mg | ORAL_TABLET | Freq: Four times a day (QID) | ORAL | Status: DC | PRN
  Administered 2019-09-10 – 2019-09-11 (×4): 650 mg via ORAL
  Filled 2019-09-08 (×4): qty 2

## 2019-09-08 MED ORDER — SODIUM CHLORIDE (PF) 0.9 % IJ SOLN
INTRAMUSCULAR | Status: AC
  Filled 2019-09-08: qty 50

## 2019-09-08 MED ORDER — HEPARIN (PORCINE) 25000 UT/250ML-% IV SOLN
1300.0000 [IU]/h | INTRAVENOUS | Status: DC
  Administered 2019-09-08: 21:00:00 1300 [IU]/h via INTRAVENOUS
  Filled 2019-09-08: qty 250

## 2019-09-08 MED ORDER — VITAMIN D3 25 MCG (1000 UNIT) PO TABS
5000.0000 [IU] | ORAL_TABLET | Freq: Every day | ORAL | Status: DC
  Administered 2019-09-09 – 2019-09-12 (×4): 5000 [IU] via ORAL
  Filled 2019-09-08 (×5): qty 5

## 2019-09-08 NOTE — ED Notes (Signed)
Attempted to obtain 2nd IV site, unsuccessful with two sticks. Will consult IV team.

## 2019-09-08 NOTE — H&P (Signed)
TRH H&P    Patient Demographics:    Tiffany Arnold, is a 78 y.o. female  MRN: [id number]  DOB - 09/08/1941  Admit Date - 09/08/2019  Referring MD/NP/PA:  Julianne Rice  Outpatient Primary MD for the patient is [name], NP  Patient coming from:  [name]  Chief complaint-  PE   HPI:    Tiffany Arnold  is a 78 y.o. 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 New pelvic mass, apparently had a CT scan to w/up her new pelvic mass and pulmonary embolism was seen. Pt sent to ED for evaluation.   In ED T 98.8 P 89 R 17 Bp 171/73  Pox 94% on RA  Wt 110.2 kg  CT abd/ pelvis IMPRESSION: 1. Acute bilateral pulmonary embolism with large saddle embolus. Dilated main pulmonary artery suggesting pulmonary hypertension. Patient will be brought immediately to the emergency department at [facility] from the lobby of the radiology department per instructions of Dr. Denman George. 2. Infiltrative poorly marginated heterogeneously enhancing 7.0 x 4.3 x 5.0 cm midline pelvic mass centered in the region of the vaginal fornix with involvement of the uterine cervix and with intimate association with the anterior rectal wall, suspicious for malignancy of uncertain primary site. 3. Extensive lymphadenopathy throughout the mediastinum, retroperitoneum and bilateral pelvis, likely metastatic adenopathy. 4. Enlarged uterus with apparent 7.5 cm anterior uterine body solid mass, nonspecific, potentially large uterine fibroid. No adnexal masses. No ascites. 5. Cholelithiasis. Nonspecific gas in the fundal  gallbladder. Consider short-term follow-up CT abdomen to exclude emphysematous cholecystitis. No biliary ductal dilatation. 6. Marked colonic diverticulosis.  Wbc 12.0, Hgb 9.8, Plt 282 Na 140, k 4.0 Bun 13, Creatinine 0.66 Ast 28, Alt 28 Alb 2.7 INR 1.4  ED curbsided vascular who recommended heparin gtt and transition to coumadin.   Pt will be admitted for PE, w failure of eliquis   Review of systems:    In addition to the HPI above,   No Fever-chills, No Headache, No changes with Vision or hearing, No problems swallowing food or Liquids, No Chest pain, Cough or Shortness of Breath, No Abdominal pain, No Nausea or Vomiting, bowel movements are regular, No Blood in stool or Urine, No dysuria, No new skin rashes or bruises, No new joints pains-aches,  No new weakness, tingling, numbness in any extremity, No recent weight gain or loss, No polyuria, polydypsia or polyphagia, No significant Mental Stressors.  All other systems reviewed and are negative.    Past History of the following :    Past Medical History:  Diagnosis Date   Allergic rhinitis, seasonal    Arm bruise    left arm down to hand from fall 01-05-2019   Bilateral renal cysts  simple (monitored by dr Karsten Ro)   Chronic cystitis    urologist-- dr Karsten Ro   GAD (generalized anxiety disorder)    History of cancer chemotherapy 09-09-2016  to 11-12-2016   breast cancer   History of external beam radiation therapy 12-24-2016  to 02-03-2017   right breast   History of recurrent UTIs    Humerus fracture 01/05/2019   left ,  hx total shoulder arthroplasty    Hyperlipidemia    Hypertension    IBS (irritable bowel syndrome)    mixed   Lymphedema of right upper extremity    Malignant neoplasm of lower-inner quadrant of right breast of female, estrogen receptor positive [facility])    ONCOLOGIST-- dr Burr Medico;   dx 06/ 2017,  Stage IIB (pT1c N1 cM0), Grade 1-2,  DCIS,  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/   07-29-2016  s/p  right lumpectomy w/ sln dissections (1 out of 16 positve),   completed chemo 11-12-2016,  completed radiation 02-03-2017,  started anti-estrogen therapy 02-17-2017   Memory disorder 06/22/2017   with intermittant confusion;  neurologist-- dr Krista Blue (note in epic)   Mild persistent asthma    no inhaler   OA (osteoarthritis)    OSA on CPAP    Type 2 diabetes, diet controlled ([place])    Urge incontinence of urine    Wears glasses       Past Surgical History:  Procedure Laterality Date   BREAST CYST EXCISION Right 2001   negative   BREAST LUMPECTOMY WITH NEEDLE LOCALIZATION AND AXILLARY LYMPH NODE DISSECTION Right 07/29/2016   Procedure: RIGHT BREAST LUMPECTOMY WITH DOUBLE NEEDLE LOCALIZATION AND COMPLETE RIGHT AXILLARY LYMPH NODE DISSECTION;  Surgeon: Fanny Skates, MD;  Location: [place];  Service: General;  Laterality: Right;   BREAST SURGERY Left 2000   Biopsy   BUNIONECTOMY Bilateral 1998   great toe fusion on right foot   CATARACT EXTRACTION W/ INTRAOCULAR LENS  IMPLANT, BILATERAL  right,  fall 2019;  left 2017   COLONOSCOPY W/ POLYPECTOMY     LUMBAR SPINE SURGERY  1970s   NASAL SINUS SURGERY  2015   ORIF HUMERUS FRACTURE Left 01/27/2019   Procedure: OPEN REDUCTION INTERNAL FIXATION (ORIF) LEFT HUMERUS;  Surgeon: Justice Britain, MD;  Location: [facility];  Service: Orthopedics;  Laterality: Left;   PERIPHERAL VASCULAR THROMBECTOMY Right 08/15/2019   Procedure: PERIPHERAL VASCULAR THROMBECTOMY WITH IVC FILTER;  Surgeon: Katha Cabal, MD;  Location: [place];  Service: Cardiovascular;  Laterality: Right;   PORTACATH PLACEMENT N/A 09/05/2016   Procedure: INSERTION PORT-A-CATH;  Surgeon: Fanny Skates, MD;  Location: [facility];  Service: General;  Laterality: N/A;   TOTAL KNEE ARTHROPLASTY Bilateral 2007   TOTAL SHOULDER REPLACEMENT Left [id number]   UMBILICAL HERNIA REPAIR  04-06-2014   @[facility]       Social History:      Social History   Tobacco  Use   Smoking status: Former Smoker    Packs/day: 2.00    Years: 25.00    Pack years: 50.00    Quit date: 12/23/1991    Years since quitting: 27.7   Smokeless tobacco: Never Used  Substance Use Topics   Alcohol use: Not Currently       Family History :     Family History  Problem Relation Age of Onset   Arthritis Mother    Stroke Mother    Hypertension Mother    Cancer Father        Prostate   Stroke Father    Hypertension  Father    Hypertension Maternal Grandmother    Rheum arthritis Maternal Grandfather    Stroke Maternal Grandfather    Hypertension Maternal Grandfather    Cancer Paternal Grandmother        Colon   Hypertension Paternal Grandmother    Hypertension Paternal Grandfather    Breast cancer Neg Hx        Home Medications:   Prior to Admission medications   Medication Sig Start Date End Date Taking? Authorizing Provider  apixaban (ELIQUIS) 5 MG TABS tablet Take 1 tablet (5 mg total) by mouth 2 (two) times daily. 08/23/19 10/22/19 Yes Pyreddy, Reatha Harps, MD  cetirizine (ZYRTEC) 10 MG tablet Take 10 mg by mouth every evening.    Yes [provider]  Cholecalciferol (VITAMIN D-3) 125 MCG (5000 UT) TABS Take 5,000 Units by mouth daily.   Yes [provider]  exemestane (AROMASIN) 25 MG tablet Take 1 tablet (25 mg total) by mouth daily. 01/20/19  Yes Truitt Merle, MD  ferrous sulfate 325 (65 FE) MG tablet Take 325 mg by mouth daily with breakfast.   Yes [provider]  glipiZIDE (GLUCOTROL) 5 MG tablet Take 1 tablet (5 mg total) by mouth daily before breakfast. 07/20/19  Yes Baity, Coralie Keens, NP  lisinopril (ZESTRIL) 20 MG tablet Take 1 tablet (20 mg total) by mouth daily. 07/14/19  Yes Baity, Coralie Keens, NP  memantine (NAMENDA) 10 MG tablet TAKE ONE TABLET BY MOUTH TWICE DAILY Patient taking differently: Take 10 mg by mouth 2 (two) times daily.  09/05/19  Yes Baity, Coralie Keens, NP  nitrofurantoin (MACRODANTIN) 100 MG capsule Take 100  mg by mouth 2 (two) times daily.   Yes [provider]  Probiotic Product (PROBIOTIC PO) Take 1 capsule by mouth daily.    Yes [provider]  rosuvastatin (CRESTOR) 10 MG tablet Take 1 tablet (10 mg total) by mouth daily. 07/20/19  Yes [name], NP  sertraline (ZOLOFT) 100 MG tablet Take 1 tablet (100 mg total) by mouth daily. 07/14/19  Yes [name], NP     Allergies:     Allergies  Allergen Reactions   Other Shortness Of Breath, Swelling and Other (See Comments)    Cats, dogs, mold Induced asthma Cats, dogs, mold     Physical Exam:   Vitals  Blood pressure (!) 171/84, pulse 87, temperature 98.8 F (37.1 C), resp. rate 19, height 5' (1.524 m), weight 110.2 kg, SpO2 98 %.  1.  General: axoxo3    2. Psychiatric: euthymic  3. Neurologic: cn2-12 intact, reflexes 2+ symmetric, diffuse with no clonus, motor 5/5 in all 4 ext  4. HEENMT:  Anicteric, pupils 1.35m symmetric, direct, consensual, near intact Neck: no jvd  5. Respiratory : CTAB  6. Cardiovascular : rrr s1, s2, no m/g/r  7. Gastrointestinal:  Abd: soft, nt, nd, +bs  8. Skin:  Ext: no c/c trace edema, no rash  9.Musculoskeletal:  Good rom,    Data Review:    CBC Recent Labs  Lab 09/08/19 1725  WBC 12.0*  HGB 9.8*  HCT 33.6*  PLT 282  MCV 96.8  MCH 28.2  MCHC 29.2*  RDW 15.4  LYMPHSABS 1.1  MONOABS 0.7  EOSABS 0.0  BASOSABS 0.0   ------------------------------------------------------------------------------------------------------------------  Results for orders placed or performed during the hospital encounter of 09/08/19 (from the past 48 hour(s))  CBC with Differential/Platelet     Status: Abnormal   Collection Time: 09/08/19  5:25 PM  Result  Value Ref Range   WBC 12.0 (H) 4.0 - 10.5 K/uL   RBC 3.47 (L) 3.87 - 5.11 MIL/uL   Hemoglobin 9.8 (L) 12.0 - 15.0 g/dL   HCT 33.6 (L) 36.0 - 46.0 %   MCV 96.8 80.0 - 100.0 fL   MCH 28.2 26.0 - 34.0 pg   MCHC  29.2 (L) 30.0 - 36.0 g/dL   RDW 15.4 11.5 - 15.5 %   Platelets 282 150 - 400 K/uL   nRBC 0.0 0.0 - 0.2 %   Neutrophils Relative % 83 %   Neutro Abs 10.0 (H) 1.7 - 7.7 K/uL   Lymphocytes Relative 10 %   Lymphs Abs 1.1 0.7 - 4.0 K/uL   Monocytes Relative 6 %   Monocytes Absolute 0.7 0.1 - 1.0 K/uL   Eosinophils Relative 0 %   Eosinophils Absolute 0.0 0.0 - 0.5 K/uL   Basophils Relative 0 %   Basophils Absolute 0.0 0.0 - 0.1 K/uL   Immature Granulocytes 1 %   Abs Immature Granulocytes 0.06 0.00 - 0.07 K/uL    Comment: Performed at [facility], [place] [address] [place], [place] [address]  Comprehensive metabolic panel     Status: Abnormal   Collection Time: 09/08/19  5:25 PM  Result Value Ref Range   Sodium 140 135 - 145 mmol/L   Potassium 4.0 3.5 - 5.1 mmol/L   Chloride 102 98 - 111 mmol/L   CO2 28 22 - 32 mmol/L   Glucose, Bld 65 (L) 70 - 99 mg/dL   BUN 13 8 - 23 mg/dL   Creatinine, Ser 0.66 0.44 - 1.00 mg/dL   Calcium 8.9 8.9 - 10.3 mg/dL   Total Protein 7.2 6.5 - 8.1 g/dL   Albumin 2.7 (L) 3.5 - 5.0 g/dL   AST 28 15 - 41 U/L   ALT 28 0 - 44 U/L   Alkaline Phosphatase 83 38 - 126 U/L   Total Bilirubin 0.4 0.3 - 1.2 mg/dL   GFR calc non Af Amer >60 >60 mL/min   GFR calc Af Amer >60 >60 mL/min   Anion gap 10 5 - 15    Comment: Performed at [facility], [place] [address], [place], [place] [address]  Protime-INR     Status: Abnormal   Collection Time: 09/08/19  5:25 PM  Result Value Ref Range   Prothrombin Time 17.3 (H) 11.4 - 15.2 seconds   INR 1.4 (H) 0.8 - 1.2    Comment: (NOTE) INR goal varies based on device and disease states. Performed at [facility], [place] [address], [place], [place] [address]   APTT     Status: None   Collection Time: 09/08/19  5:25 PM  Result Value Ref Range   aPTT 35 24 - 36 seconds    Comment: Performed at [facility], [place] [address] [place], [place] [address]  CBG  monitoring, ED     Status: Abnormal   Collection Time: 09/08/19  7:33 PM  Result Value Ref Range   Glucose-Capillary 58 (L) 70 - 99 mg/dL  CBG monitoring, ED     Status: None   Collection Time: 09/08/19  8:12 PM  Result Value Ref Range   Glucose-Capillary 81 70 - 99 mg/dL    Chemistries  Recent Labs  Lab 09/08/19 1725  NA 140  K 4.0  CL 102  CO2 28  GLUCOSE 65*  BUN 13  CREATININE 0.66  CALCIUM 8.9  AST 28  ALT 28  ALKPHOS 83  BILITOT 0.4   ------------------------------------------------------------------------------------------------------------------  ------------------------------------------------------------------------------------------------------------------ GFR: Estimated Creatinine Clearance: 68.5 mL/min (by C-G formula based on SCr of 0.66 mg/dL). Liver Function Tests: Recent Labs  Lab 09/08/19 1725  AST 28  ALT 28  ALKPHOS 83  BILITOT 0.4  PROT 7.2  ALBUMIN 2.7*   No results for input(s): LIPASE, AMYLASE in the last 168 hours. No results for input(s): AMMONIA in the last 168 hours. Coagulation Profile: Recent Labs  Lab 09/08/19 1725  INR 1.4*   Cardiac Enzymes: No results for input(s): CKTOTAL, CKMB, CKMBINDEX, TROPONINI in the last 168 hours. BNP (last 3 results) No results for input(s): PROBNP in the last 8760 hours. HbA1C: No results for input(s): HGBA1C in the last 72 hours. CBG: Recent Labs  Lab 09/08/19 1933 09/08/19 2012  GLUCAP 58* 81   Lipid Profile: No results for input(s): CHOL, HDL, LDLCALC, TRIG, CHOLHDL, LDLDIRECT in the last 72 hours. Thyroid Function Tests: No results for input(s): TSH, T4TOTAL, FREET4, T3FREE, THYROIDAB in the last 72 hours. Anemia Panel: No results for input(s): VITAMINB12, FOLATE, FERRITIN, TIBC, IRON, RETICCTPCT in the last 72 hours.  --------------------------------------------------------------------------------------------------------------- Urine analysis:    Component Value Date/Time    COLORURINE YELLOW (A) 07/06/2017 1318   APPEARANCEUR HAZY (A) 07/06/2017 1318   APPEARANCEUR Turbid 05/15/2012 1657   LABSPEC 1.020 07/06/2017 1318   LABSPEC 1.023 05/15/2012 1657   PHURINE 5.0 07/06/2017 1318   GLUCOSEU NEGATIVE 07/06/2017 1318   GLUCOSEU Negative 05/15/2012 1657   HGBUR SMALL (A) 07/06/2017 1318   BILIRUBINUR neg 07/14/2019 1612   BILIRUBINUR Negative 05/15/2012 1657   KETONESUR NEGATIVE 07/06/2017 1318   PROTEINUR Positive (A) 07/14/2019 1612   PROTEINUR 30 (A) 07/06/2017 1318   UROBILINOGEN 1.0 07/14/2019 1612   NITRITE negative 07/14/2019 1612   NITRITE POSITIVE (A) 07/06/2017 1318   LEUKOCYTESUR Small (1+) (A) 07/14/2019 1612   LEUKOCYTESUR 2+ 05/15/2012 1657      Imaging Results:    Ct Head Wo Contrast  Result Date: 09/08/2019 CLINICAL DATA:  [place] in bathroom recent CT with positive PE EXAM: CT HEAD WITHOUT CONTRAST TECHNIQUE: Contiguous axial images were obtained from the base of the skull through the vertex without intravenous contrast. COMPARISON:  CT 09/08/2019, 04/13/2019 FINDINGS: Brain: No acute territorial infarction, hemorrhage or intracranial mass. Atrophy and moderate hypodensity in the white matter consistent with small vessel ischemic change. Stable ventricle size Vascular: No hyperdense vessels.  Carotid vascular calcification Skull: Normal. Negative for fracture or focal lesion. Sinuses/Orbits: Postsurgical changes of the maxillary and ethmoid sinuses with mild mucosal thickening. Other: Small left forehead soft tissue swelling IMPRESSION: 1. No CT evidence for acute intracranial abnormality. 2. Atrophy and small vessel ischemic changes of white matter. Electronically Signed   By: Donavan Foil M.D.   On: 09/08/2019 19:11   Ct Chest W Contrast  Result Date: 09/08/2019 CLINICAL DATA:  Uterine and cervical mass on exam. Gyn malignancy is suspected. Staging evaluation. History of right breast cancer. Dyspnea. EXAM: CT CHEST, ABDOMEN, AND PELVIS WITH  CONTRAST TECHNIQUE: Multidetector CT imaging of the chest, abdomen and pelvis was performed following the standard protocol during bolus administration of intravenous contrast. CONTRAST:  153m OMNIPAQUE IOHEXOL 300 MG/ML  SOLN COMPARISON:  07/14/2019 chest radiograph. FINDINGS: CT CHEST FINDINGS Cardiovascular: Normal heart size. No significant pericardial effusion/thickening. Atherosclerotic nonaneurysmal thoracic aorta. Dilated main pulmonary artery (3.5 cm diameter). There is acute bilateral pulmonary embolism including a large saddle embolus. RV/LV ratio 0.90. Mediastinum/Nodes: No discrete thyroid  nodules. Unremarkable esophagus. Surgical clips in the right axilla. No pathologically enlarged axillary nodes. Right paratracheal adenopathy up to 2.0 cm short axis diameter (series 2/image 18). Enlarged 2.1 cm AP window node (series 2/image 20). Left prevascular adenopathy up to 1.1 cm (series 2/image 17). Enlarged 1.1 cm left posterior mediastinal node along the descending aorta (series 2/image 26). Bilateral retrocrural adenopathy measuring up to 1.3 cm on the right (series 2/image 46). Lungs/Pleura: No pneumothorax. No pleural effusion. No acute consolidative airspace disease, lung masses or significant pulmonary nodules. Musculoskeletal: No aggressive appearing focal osseous lesions. Marked thoracic spondylosis. CT ABDOMEN PELVIS FINDINGS Hepatobiliary: Normal liver size. Subcentimeter posterior right liver hypodense lesion is too small to characterize (series 2/image 45). No additional liver lesions. Cholelithiasis. Scattered gas within the contracted fundal gallbladder. No definite gallbladder wall thickening or pericholecystic fluid. No biliary ductal dilatation. Pancreas: Normal, with no mass or duct dilation. Spleen: Normal size. No mass. Adrenals/Urinary Tract: No discrete adrenal nodules. Mild fullness of the central right renal collecting system without overt right hydronephrosis. No left  hydronephrosis. Symmetric contrast nephrograms. Scattered subcentimeter hypodense renal cortical lesions in both kidneys are too small to characterize and require no follow-up. Small parapelvic renal cysts in the left kidney. Normal bladder. Stomach/Bowel: Normal non-distended stomach. Normal caliber small bowel with no small bowel wall thickening. Normal appendix. Oral contrast transits to the left colon. Marked colonic diverticulosis, most prominent in the sigmoid colon, with no definite large bowel wall thickening or significant pericolonic fat stranding. Vascular/Lymphatic: Atherosclerotic nonaneurysmal abdominal aorta. Patent portal, splenic, hepatic and renal veins. IVC filter in place within the infrarenal IVC. Right external iliac vein stent. Extensive retroperitoneal and bilateral pelvic lymphadenopathy involving aortocaval, left para-aortic, bilateral common iliac, bilateral external iliac and right inguinal nodal chains. Representative enlarged 1.8 cm right inguinal node (series 7/image 103), 2.0 cm right external iliac node (series 7/image 90), 2.1 cm right common iliac node (series 7/image 76), 2.0 cm posterior aortocaval node (series 7/image 64) and 1.6 cm left para-aortic node (series 7/image 62). Reproductive: Enlarged uterus. Infiltrative poorly marginated heterogeneously enhancing 7.0 x 4.3 x 5.0 cm midline pelvic mass centered in the region of the vaginal fornix with involvement of the uterine cervix and intimate association with the anterior rectal wall. Scattered gas in the uterine cavity. Apparent 7.5 cm anterior uterine body solid mass, nonspecific, potentially a fibroid. No adnexal masses. Other: No pneumoperitoneum, ascites or focal fluid collection. Musculoskeletal: No aggressive appearing focal osseous lesions. 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 Chronic appearing osteolytic change at L3-4. IMPRESSION: 1. Acute bilateral pulmonary embolism with  large saddle embolus. Dilated main pulmonary artery suggesting pulmonary hypertension. Patient will be brought immediately to the emergency department at [facility] from the lobby of the radiology department per instructions of Dr. Denman George. 2. Infiltrative poorly marginated heterogeneously enhancing 7.0 x 4.3 x 5.0 cm midline pelvic mass centered in the region of the vaginal fornix with involvement of the uterine cervix and with intimate association with the anterior rectal wall, suspicious for malignancy of uncertain primary site. 3. Extensive lymphadenopathy throughout the mediastinum, retroperitoneum and bilateral pelvis, likely metastatic adenopathy. 4. Enlarged uterus with apparent 7.5 cm anterior uterine body solid mass, nonspecific, potentially large uterine fibroid. No adnexal masses. No ascites. 5. Cholelithiasis. Nonspecific gas in the fundal gallbladder. Consider short-term follow-up CT abdomen to exclude emphysematous cholecystitis. No biliary ductal dilatation. 6. Marked colonic diverticulosis. 7.  Aortic Atherosclerosis (ICD10-I70.0). Critical Value/emergent results were called by  telephone at the time of interpretation on 09/08/2019 at 3:20 pm to provider EMMA ROSSI, MD, who verbally acknowledged these results. Electronically Signed   By: Ilona Sorrel M.D.   On: 09/08/2019 15:46   Ct Abdomen Pelvis W Contrast  Result Date: 09/08/2019 CLINICAL DATA:  Uterine and cervical mass on exam. Gyn malignancy is suspected. Staging evaluation. History of right breast cancer. Dyspnea. EXAM: CT CHEST, ABDOMEN, AND PELVIS WITH CONTRAST TECHNIQUE: Multidetector CT imaging of the chest, abdomen and pelvis was performed following the standard protocol during bolus administration of intravenous contrast. CONTRAST:  16m OMNIPAQUE IOHEXOL 300 MG/ML  SOLN COMPARISON:  07/14/2019 chest radiograph. FINDINGS: CT CHEST FINDINGS Cardiovascular: Normal heart size. No significant pericardial effusion/thickening.  Atherosclerotic nonaneurysmal thoracic aorta. Dilated main pulmonary artery (3.5 cm diameter). There is acute bilateral pulmonary embolism including a large saddle embolus. RV/LV ratio 0.90. Mediastinum/Nodes: No discrete thyroid nodules. Unremarkable esophagus. Surgical clips in the right axilla. No pathologically enlarged axillary nodes. Right paratracheal adenopathy up to 2.0 cm short axis diameter (series 2/image 18). Enlarged 2.1 cm AP window node (series 2/image 20). Left prevascular adenopathy up to 1.1 cm (series 2/image 17). Enlarged 1.1 cm left posterior mediastinal node along the descending aorta (series 2/image 26). Bilateral retrocrural adenopathy measuring up to 1.3 cm on the right (series 2/image 46). Lungs/Pleura: No pneumothorax. No pleural effusion. No acute consolidative airspace disease, lung masses or significant pulmonary nodules. Musculoskeletal: No aggressive appearing focal osseous lesions. Marked thoracic spondylosis. CT ABDOMEN PELVIS FINDINGS Hepatobiliary: Normal liver size. Subcentimeter posterior right liver hypodense lesion is too small to characterize (series 2/image 45). No additional liver lesions. Cholelithiasis. Scattered gas within the contracted fundal gallbladder. No definite gallbladder wall thickening or pericholecystic fluid. No biliary ductal dilatation. Pancreas: Normal, with no mass or duct dilation. Spleen: Normal size. No mass. Adrenals/Urinary Tract: No discrete adrenal nodules. Mild fullness of the central right renal collecting system without overt right hydronephrosis. No left hydronephrosis. Symmetric contrast nephrograms. Scattered subcentimeter hypodense renal cortical lesions in both kidneys are too small to characterize and require no follow-up. Small parapelvic renal cysts in the left kidney. Normal bladder. Stomach/Bowel: Normal non-distended stomach. Normal caliber small bowel with no small bowel wall thickening. Normal appendix. Oral contrast transits to  the left colon. Marked colonic diverticulosis, most prominent in the sigmoid colon, with no definite large bowel wall thickening or significant pericolonic fat stranding. Vascular/Lymphatic: Atherosclerotic nonaneurysmal abdominal aorta. Patent portal, splenic, hepatic and renal veins. IVC filter in place within the infrarenal IVC. Right external iliac vein stent. Extensive retroperitoneal and bilateral pelvic lymphadenopathy involving aortocaval, left para-aortic, bilateral common iliac, bilateral external iliac and right inguinal nodal chains. Representative enlarged 1.8 cm right inguinal node (series 7/image 103), 2.0 cm right external iliac node (series 7/image 90), 2.1 cm right common iliac node (series 7/image 76), 2.0 cm posterior aortocaval node (series 7/image 64) and 1.6 cm left para-aortic node (series 7/image 62). Reproductive: Enlarged uterus. Infiltrative poorly marginated heterogeneously enhancing 7.0 x 4.3 x 5.0 cm midline pelvic mass centered in the region of the vaginal fornix with involvement of the uterine cervix and intimate association with the anterior rectal wall. Scattered gas in the uterine cavity. Apparent 7.5 cm anterior uterine body solid mass, nonspecific, potentially a fibroid. No adnexal masses. Other: No pneumoperitoneum, ascites or focal fluid collection. Musculoskeletal: No aggressive appearing focal osseous lesions. 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 Chronic appearing osteolytic change at L3-4. IMPRESSION: 1. Acute bilateral pulmonary embolism  with large saddle embolus. Dilated main pulmonary artery suggesting pulmonary hypertension. Patient will be brought immediately to the emergency department at [facility] from the lobby of the radiology department per instructions of Dr. Denman George. 2. Infiltrative poorly marginated heterogeneously enhancing 7.0 x 4.3 x 5.0 cm midline pelvic mass centered in the region of the vaginal fornix  with involvement of the uterine cervix and with intimate association with the anterior rectal wall, suspicious for malignancy of uncertain primary site. 3. Extensive lymphadenopathy throughout the mediastinum, retroperitoneum and bilateral pelvis, likely metastatic adenopathy. 4. Enlarged uterus with apparent 7.5 cm anterior uterine body solid mass, nonspecific, potentially large uterine fibroid. No adnexal masses. No ascites. 5. Cholelithiasis. Nonspecific gas in the fundal gallbladder. Consider short-term follow-up CT abdomen to exclude emphysematous cholecystitis. No biliary ductal dilatation. 6. Marked colonic diverticulosis. 7.  Aortic Atherosclerosis (ICD10-I70.0). Critical Value/emergent results were called by telephone at the time of interpretation on 09/08/2019 at 3:20 pm to provider EMMA ROSSI, MD, who verbally acknowledged these results. Electronically Signed   By: Ilona Sorrel M.D.   On: 09/08/2019 15:46       Assessment & Plan:    Principal Problem:   Pulmonary embolism (HCC) Active Problems:   Essential hypertension   HLD (hyperlipidemia)   Type 2 diabetes mellitus without complication (HCC)   OSA on CPAP   Memory disorder  PE Tele Trop I q3h x2 Check cardiac echo DC eliquis (Eliquis failure) Heparin GTT Please start coumadin in the AM  New Pelvic Mass Please consult oncology in AM  Dm2, w hypoglycemia Hold Glipizide Pt given orange juice and crackers, and peanut butter Pt will be placed on diet,  fsbs ac and qhs, ISS  Hypertension Cont Lisinopril 47m po qday  Memory disorder Cont Namenda 145mpo bid  Anxiety Cont Zoloft 10061mo qday   DVT Prophylaxis-   Heparin GTT,  SCDs    AM Labs Ordered, also please review Full Orders  Family Communication: Admission, patients condition and plan of care including tests being ordered have been discussed with the patient  who indicate understanding and agree with the plan and Code Status.  Code Status:  FULL CODE per  patient,   Admission status: Inpatient: Based on patients clinical presentation and evaluation of above clinical data, I have made determination that patient meets Inpatient criteria at this time.  Pt will be admitted for acute PE, (eliquis failure), and therefore will require heparin GTT and transition to coumadin. Pt has high risk of clinical deterioration, pt will require >2 nites stay.    Time spent in minutes : 70 minutes   JamJani GravelD on 09/08/2019 at 8:14 PM

## 2019-09-08 NOTE — Progress Notes (Signed)
Dr. Skeet Latch reviewed CT images and aware of ED evaluation.  Plan will be for chemoradiation due to widely metastatic HIGH-GRADE ENDOMETRIOID ADENOCARCINOMA.  Referrals will be placed for Dr. Heath Lark and Dr. Gery Pray.

## 2019-09-08 NOTE — ED Notes (Signed)
Pt placed in hospital bed

## 2019-09-08 NOTE — ED Notes (Signed)
Pt placed on 2L of O2. 

## 2019-09-08 NOTE — ED Provider Notes (Signed)
[facility] DEPT Provider Note   CSN: [id number] Arrival date & time: 09/08/19  1557     History   Chief Complaint Chief Complaint  Patient presents with   pulmonary embolism    HPI Tiffany Arnold is a 78 y.o. female.     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  Underwent thrombectomy, stent placement and IVC filter placement.  Patient is currently on Eliquis.  Patient noted to have a pelvic mass that was concerning for malignancy today.  Today she underwent CT chest, abdomen and pelvis with contrast for staging.  CT demonstrated saddle and bilateral pulmonary emboli.  Patient also had a fall while she was going to the bathroom.  That was unwitnessed.  Patient denies hitting her head or loss of consciousness.  Patient states that her right lower extremity swelling has improved.  She states she has had ongoing dyspnea especially with minimal exertion.  Denies cough, chest pain, fever or chills. Past Medical History:  Diagnosis Date   Allergic rhinitis, seasonal    Arm bruise    left arm down to hand from fall 01-05-2019   Bilateral renal cysts    simple (monitored by dr Karsten Ro)   Chronic cystitis    urologist-- dr Karsten Ro   GAD (generalized anxiety disorder)    History of cancer chemotherapy 09-09-2016  to 11-12-2016   breast cancer   History of external beam radiation therapy 12-24-2016  to 02-03-2017   right breast   History of recurrent UTIs    Humerus fracture 01/05/2019   left ,  hx total shoulder arthroplasty    Hyperlipidemia    Hypertension    IBS (irritable bowel syndrome)    mixed   Lymphedema of right upper extremity    Malignant neoplasm of lower-inner quadrant of right breast of female, estrogen receptor positive [facility])    ONCOLOGIST-- dr Burr Medico;   dx 06/ 2017,  Stage IIB (pT1c N1 cM0), Grade 1-2,  DCIS,  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/  07-29-2016  s/p  right  lumpectomy w/ sln dissections (1 out of 16 positve),   completed chemo 11-12-2016,  completed radiation 02-03-2017,  started anti-estrogen therapy 02-17-2017   Memory disorder 06/22/2017   with intermittant confusion;  neurologist-- dr Krista Blue (note in epic)   Mild persistent asthma    no inhaler   OA (osteoarthritis)    OSA on CPAP    Type 2 diabetes, diet controlled ([place])    Urge incontinence of urine    Wears glasses     Patient Active Problem List   Diagnosis Date Noted   Pulmonary embolism ([place]) 09/08/2019   Vaginal bleeding 09/01/2019   Endometrial mass 09/01/2019   DVT of lower limb, acute ([place]) 08/15/2019   Iron deficiency anemia 07/08/2018   Recurrent UTI 01/01/2018   Memory disorder 06/22/2017   Osteoporosis 04/01/2017   Port catheter in place 12/25/2016   IBS (irritable bowel syndrome) 11/25/2016   Breast cancer of lower-inner quadrant of right female breast ([place]) 06/27/2016   OSA on CPAP 06/26/2016   Status post total left knee replacement 06/29/2015   Type 2 diabetes mellitus without complication ([place]) 97/35/3299   Essential hypertension 09/04/2014   HLD (hyperlipidemia) 09/04/2014   Gastroesophageal reflux disease without esophagitis 09/04/2014   Seasonal allergies 09/04/2014   Generalized anxiety disorder 09/04/2014   Abnormal Pap smear of cervix 04/21/2014   Anxiety 04/21/2014   Benign neoplasm of colon 04/21/2014  Chronic urticaria 04/21/2014   CPAP (continuous positive airway pressure) dependence 04/21/2014   Fibrocystic breast disease 04/21/2014   Venous stasis 04/21/2014   Unspecified asthma, uncomplicated 85/92/9244    Past Surgical History:  Procedure Laterality Date   BREAST CYST EXCISION Right 2001   negative   BREAST LUMPECTOMY WITH NEEDLE LOCALIZATION AND AXILLARY LYMPH NODE DISSECTION Right 07/29/2016   Procedure: RIGHT BREAST LUMPECTOMY WITH DOUBLE NEEDLE LOCALIZATION AND COMPLETE RIGHT AXILLARY LYMPH NODE  DISSECTION;  Surgeon: Fanny Skates, MD;  Location: [facility];  Service: General;  Laterality: Right;   BREAST SURGERY Left 2000   Biopsy   BUNIONECTOMY Bilateral 1998   great toe fusion on right foot   CATARACT EXTRACTION W/ INTRAOCULAR LENS  IMPLANT, BILATERAL  right,  fall 2019;  left 2017   COLONOSCOPY W/ POLYPECTOMY     [place]   NASAL SINUS SURGERY  2015   ORIF HUMERUS FRACTURE Left 01/27/2019   Procedure: OPEN REDUCTION INTERNAL FIXATION (ORIF) LEFT HUMERUS;  Surgeon: Justice Britain, MD;  Location: [facility];  Service: Orthopedics;  Laterality: Left;   PERIPHERAL VASCULAR THROMBECTOMY Right 08/15/2019   Procedure: PERIPHERAL VASCULAR THROMBECTOMY WITH IVC FILTER;  Surgeon: Katha Cabal, MD;  Location: [facility];  Service: Cardiovascular;  Laterality: Right;   PORTACATH PLACEMENT N/A 09/05/2016   Procedure: INSERTION PORT-A-CATH;  Surgeon: Fanny Skates, MD;  Location: [facility];  Service: General;  Laterality: N/A;   TOTAL KNEE ARTHROPLASTY Bilateral 2007   TOTAL SHOULDER REPLACEMENT Left [id number]   UMBILICAL HERNIA REPAIR  04-06-2014   _0      OB History   No obstetric history on file.      Home Medications    Prior to Admission medications   Medication Sig Start Date End Date Taking? Authorizing Provider  apixaban (ELIQUIS) 5 MG TABS tablet Take 1 tablet (5 mg total) by mouth 2 (two) times daily. 08/23/19 10/22/19 Yes Pyreddy, Reatha Harps, MD  cetirizine (ZYRTEC) 10 MG tablet Take 10 mg by mouth every evening.    Yes [provider]  Cholecalciferol (VITAMIN D-3) 125 MCG (5000 UT) TABS Take 5,000 Units by mouth daily.   Yes [provider]  exemestane (AROMASIN) 25 MG tablet Take 1 tablet (25 mg total) by mouth daily. 01/20/19  Yes Truitt Merle, MD  ferrous sulfate 325 (65 FE) MG tablet Take 325 mg by mouth daily with breakfast.   Yes [provider]  glipiZIDE (GLUCOTROL) 5 MG tablet Take 1 tablet (5 mg total) by mouth  daily before breakfast. 07/20/19  Yes Baity, Coralie Keens, NP  lisinopril (ZESTRIL) 20 MG tablet Take 1 tablet (20 mg total) by mouth daily. 07/14/19  Yes Baity, Coralie Keens, NP  memantine (NAMENDA) 10 MG tablet TAKE ONE TABLET BY MOUTH TWICE DAILY Patient taking differently: Take 10 mg by mouth 2 (two) times daily.  09/05/19  Yes Baity, Coralie Keens, NP  nitrofurantoin (MACRODANTIN) 100 MG capsule Take 100 mg by mouth 2 (two) times daily.   Yes [provider]  Probiotic Product (PROBIOTIC PO) Take 1 capsule by mouth daily.    Yes [provider]  rosuvastatin (CRESTOR) 10 MG tablet Take 1 tablet (10 mg total) by mouth daily. 07/20/19  Yes [name], NP  sertraline (ZOLOFT) 100 MG tablet Take 1 tablet (100 mg total) by mouth daily. 07/14/19  Yes [name], NP    Family History Family History  Problem Relation Age of Onset   Arthritis Mother  Stroke Mother    Hypertension Mother    Cancer Father        Prostate   Stroke Father    Hypertension Father    Hypertension Maternal Grandmother    Rheum arthritis Maternal Grandfather    Stroke Maternal Grandfather    Hypertension Maternal Grandfather    Cancer Paternal Grandmother        Colon   Hypertension Paternal Grandmother    Hypertension Paternal Grandfather    Breast cancer Neg Hx     Social History Social History   Tobacco Use   Smoking status: Former Smoker    Packs/day: 2.00    Years: 25.00    Pack years: 50.00    Quit date: 12/23/1991    Years since quitting: 27.7   Smokeless tobacco: Never Used  Substance Use Topics   Alcohol use: Not Currently   Drug use: No     Allergies   Other   Review of Systems Review of Systems  Constitutional: Negative for chills and fever.  HENT: Negative for sore throat and trouble swallowing.   Eyes: Negative for visual disturbance.  Respiratory: Positive for shortness of breath. Negative for cough.   Cardiovascular: Positive for leg swelling.  Negative for chest pain and palpitations.  Gastrointestinal: Negative for abdominal pain, constipation, diarrhea, nausea and vomiting.  Genitourinary: Negative for dysuria and flank pain.  Musculoskeletal: Negative for back pain.  Skin: Negative for wound.  Neurological: Negative for dizziness, syncope, weakness, light-headedness, numbness and headaches.  All other systems reviewed and are negative.    Physical Exam Updated Vital Signs BP (!) 179/75 Comment: order received, giving hydralazine 5   Pulse 79    Temp 98.1 F (36.7 C) (Oral)    Resp (!) 22    Ht 5' (1.524 m)    Wt 110.2 kg    SpO2 95%    BMI 47.46 kg/m   Physical Exam Vitals signs and nursing note reviewed.  Constitutional:      Appearance: Normal appearance. She is well-developed.  HENT:     Head: Normocephalic and atraumatic.     Comments: No evidence of any scalp trauma.  No facial asymmetry.    Mouth/Throat:     Mouth: Mucous membranes are moist.  Eyes:     Extraocular Movements: Extraocular movements intact.     Pupils: Pupils are equal, round, and reactive to light.  Neck:     Musculoskeletal: Normal range of motion and neck supple.     Comments: No posterior midline cervical tenderness to palpation. Cardiovascular:     Rate and Rhythm: Normal rate and regular rhythm.     Heart sounds: No murmur. No friction rub. No gallop.   Pulmonary:     Effort: Pulmonary effort is normal. No respiratory distress.     Breath sounds: Normal breath sounds. No stridor. No wheezing, rhonchi or rales.  Chest:     Chest wall: No tenderness.  Abdominal:     General: Bowel sounds are normal.     Palpations: Abdomen is soft.     Tenderness: There is no abdominal tenderness. There is no guarding or rebound.  Musculoskeletal: Normal range of motion.        General: Swelling present. No tenderness.     Comments: Right lower extremity is swollen compared to left.  Diffuse erythema.  No definite tenderness to palpation.  Distal  pulses palpable.  Skin:    General: Skin is warm and dry.     Capillary  Refill: Capillary refill takes less than 2 seconds.     Findings: No erythema or rash.  Neurological:     Mental Status: She is alert.     Comments: Mild confusion.  5/5 motor in all extremities.  Sensation grossly intact.  Psychiatric:        Behavior: Behavior normal.      ED Treatments / Results  Labs (all labs ordered are listed, but only abnormal results are displayed) Labs Reviewed  CBC WITH DIFFERENTIAL/PLATELET - Abnormal; Notable for the following components:      Result Value   WBC 12.0 (*)    RBC 3.47 (*)    Hemoglobin 9.8 (*)    HCT 33.6 (*)    MCHC 29.2 (*)    Neutro Abs 10.0 (*)    All other components within normal limits  COMPREHENSIVE METABOLIC PANEL - Abnormal; Notable for the following components:   Glucose, Bld 65 (*)    Albumin 2.7 (*)    All other components within normal limits  PROTIME-INR - Abnormal; Notable for the following components:   Prothrombin Time 17.3 (*)    INR 1.4 (*)    All other components within normal limits  COMPREHENSIVE METABOLIC PANEL - Abnormal; Notable for the following components:   Calcium 8.4 (*)    Albumin 2.6 (*)    All other components within normal limits  CBC - Abnormal; Notable for the following components:   RBC 3.19 (*)    Hemoglobin 8.9 (*)    HCT 30.6 (*)    MCHC 29.1 (*)    All other components within normal limits  HEPARIN LEVEL (UNFRACTIONATED) - Abnormal; Notable for the following components:   Heparin Unfractionated >2.00 (*)    All other components within normal limits  HEPARIN LEVEL (UNFRACTIONATED) - Abnormal; Notable for the following components:   Heparin Unfractionated 1.64 (*)    All other components within normal limits  APTT - Abnormal; Notable for the following components:   aPTT 71 (*)    All other components within normal limits  APTT - Abnormal; Notable for the following components:   aPTT 68 (*)    All other  components within normal limits  GLUCOSE, CAPILLARY - Abnormal; Notable for the following components:   Glucose-Capillary 105 (*)    All other components within normal limits  GLUCOSE, CAPILLARY - Abnormal; Notable for the following components:   Glucose-Capillary 128 (*)    All other components within normal limits  CBG MONITORING, ED - Abnormal; Notable for the following components:   Glucose-Capillary 58 (*)    All other components within normal limits  CBG MONITORING, ED - Abnormal; Notable for the following components:   Glucose-Capillary 101 (*)    All other components within normal limits  CBG MONITORING, ED - Abnormal; Notable for the following components:   Glucose-Capillary 67 (*)    All other components within normal limits  SARS CORONAVIRUS 2 (TAT 6-24 HRS)  MRSA PCR SCREENING  APTT  APTT  CBC  BASIC METABOLIC PANEL  FERRITIN  IRON AND TIBC  CBG MONITORING, ED  CBG MONITORING, ED  TROPONIN I (HIGH SENSITIVITY)  TROPONIN I (HIGH SENSITIVITY)    EKG EKG Interpretation  Date/Time:  Thursday September 08 2019 16:44:41 EDT Ventricular Rate:  90 PR Interval:    QRS Duration: 94 QT Interval:  374 QTC Calculation: 458 R Axis:   41 Text Interpretation:  Sinus rhythm Low voltage, precordial leads Nonspecific T wave abnormality Confirmed by Ashok Cordia,  Lennette Bihari [phone]) on 09/09/2019 3:12:49 PM   Radiology Ct Head Wo Contrast  Result Date: 09/08/2019 CLINICAL DATA:  [place] in bathroom recent CT with positive PE EXAM: CT HEAD WITHOUT CONTRAST TECHNIQUE: Contiguous axial images were obtained from the base of the skull through the vertex without intravenous contrast. COMPARISON:  CT 09/08/2019, 04/13/2019 FINDINGS: Brain: No acute territorial infarction, hemorrhage or intracranial mass. Atrophy and moderate hypodensity in the white matter consistent with small vessel ischemic change. Stable ventricle size Vascular: No hyperdense vessels.  Carotid vascular calcification Skull: Normal.  Negative for fracture or focal lesion. Sinuses/Orbits: Postsurgical changes of the maxillary and ethmoid sinuses with mild mucosal thickening. Other: Small left forehead soft tissue swelling IMPRESSION: 1. No CT evidence for acute intracranial abnormality. 2. Atrophy and small vessel ischemic changes of white matter. Electronically Signed   By: Donavan Foil M.D.   On: 09/08/2019 19:11   Ct Chest W Contrast  Result Date: 09/08/2019 CLINICAL DATA:  Uterine and cervical mass on exam. Gyn malignancy is suspected. Staging evaluation. History of right breast cancer. Dyspnea. EXAM: CT CHEST, ABDOMEN, AND PELVIS WITH CONTRAST TECHNIQUE: Multidetector CT imaging of the chest, abdomen and pelvis was performed following the standard protocol during bolus administration of intravenous contrast. CONTRAST:  153m OMNIPAQUE IOHEXOL 300 MG/ML  SOLN COMPARISON:  07/14/2019 chest radiograph. FINDINGS: CT CHEST FINDINGS Cardiovascular: Normal heart size. No significant pericardial effusion/thickening. Atherosclerotic nonaneurysmal thoracic aorta. Dilated main pulmonary artery (3.5 cm diameter). There is acute bilateral pulmonary embolism including a large saddle embolus. RV/LV ratio 0.90. Mediastinum/Nodes: No discrete thyroid nodules. Unremarkable esophagus. Surgical clips in the right axilla. No pathologically enlarged axillary nodes. Right paratracheal adenopathy up to 2.0 cm short axis diameter (series 2/image 18). Enlarged 2.1 cm AP window node (series 2/image 20). Left prevascular adenopathy up to 1.1 cm (series 2/image 17). Enlarged 1.1 cm left posterior mediastinal node along the descending aorta (series 2/image 26). Bilateral retrocrural adenopathy measuring up to 1.3 cm on the right (series 2/image 46). Lungs/Pleura: No pneumothorax. No pleural effusion. No acute consolidative airspace disease, lung masses or significant pulmonary nodules. Musculoskeletal: No aggressive appearing focal osseous lesions. Marked thoracic  spondylosis. CT ABDOMEN PELVIS FINDINGS Hepatobiliary: Normal liver size. Subcentimeter posterior right liver hypodense lesion is too small to characterize (series 2/image 45). No additional liver lesions. Cholelithiasis. Scattered gas within the contracted fundal gallbladder. No definite gallbladder wall thickening or pericholecystic fluid. No biliary ductal dilatation. Pancreas: Normal, with no mass or duct dilation. Spleen: Normal size. No mass. Adrenals/Urinary Tract: No discrete adrenal nodules. Mild fullness of the central right renal collecting system without overt right hydronephrosis. No left hydronephrosis. Symmetric contrast nephrograms. Scattered subcentimeter hypodense renal cortical lesions in both kidneys are too small to characterize and require no follow-up. Small parapelvic renal cysts in the left kidney. Normal bladder. Stomach/Bowel: Normal non-distended stomach. Normal caliber small bowel with no small bowel wall thickening. Normal appendix. Oral contrast transits to the left colon. Marked colonic diverticulosis, most prominent in the sigmoid colon, with no definite large bowel wall thickening or significant pericolonic fat stranding. Vascular/Lymphatic: Atherosclerotic nonaneurysmal abdominal aorta. Patent portal, splenic, hepatic and renal veins. IVC filter in place within the infrarenal IVC. Right external iliac vein stent. Extensive retroperitoneal and bilateral pelvic lymphadenopathy involving aortocaval, left para-aortic, bilateral common iliac, bilateral external iliac and right inguinal nodal chains. Representative enlarged 1.8 cm right inguinal node (series 7/image 103), 2.0 cm right external iliac node (series 7/image 90), 2.1 cm right common iliac node (series 7/image  76), 2.0 cm posterior aortocaval node (series 7/image 64) and 1.6 cm left para-aortic node (series 7/image 62). Reproductive: Enlarged uterus. Infiltrative poorly marginated heterogeneously enhancing 7.0 x 4.3 x 5.0 cm  midline pelvic mass centered in the region of the vaginal fornix with involvement of the uterine cervix and intimate association with the anterior rectal wall. Scattered gas in the uterine cavity. Apparent 7.5 cm anterior uterine body solid mass, nonspecific, potentially a fibroid. No adnexal masses. Other: No pneumoperitoneum, ascites or focal fluid collection. Musculoskeletal: No aggressive appearing focal osseous lesions. 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 Chronic appearing osteolytic change at L3-4. IMPRESSION: 1. Acute bilateral pulmonary embolism with large saddle embolus. Dilated main pulmonary artery suggesting pulmonary hypertension. Patient will be brought immediately to the emergency department at [facility] from the lobby of the radiology department per instructions of Dr. Denman George. 2. Infiltrative poorly marginated heterogeneously enhancing 7.0 x 4.3 x 5.0 cm midline pelvic mass centered in the region of the vaginal fornix with involvement of the uterine cervix and with intimate association with the anterior rectal wall, suspicious for malignancy of uncertain primary site. 3. Extensive lymphadenopathy throughout the mediastinum, retroperitoneum and bilateral pelvis, likely metastatic adenopathy. 4. Enlarged uterus with apparent 7.5 cm anterior uterine body solid mass, nonspecific, potentially large uterine fibroid. No adnexal masses. No ascites. 5. Cholelithiasis. Nonspecific gas in the fundal gallbladder. Consider short-term follow-up CT abdomen to exclude emphysematous cholecystitis. No biliary ductal dilatation. 6. Marked colonic diverticulosis. 7.  Aortic Atherosclerosis (ICD10-I70.0). Critical Value/emergent results were called by telephone at the time of interpretation on 09/08/2019 at 3:20 pm to provider EMMA ROSSI, MD, who verbally acknowledged these results. Electronically Signed   By: Ilona Sorrel M.D.   On: 09/08/2019 15:46   Ct Abdomen  Pelvis W Contrast  Result Date: 09/08/2019 CLINICAL DATA:  Uterine and cervical mass on exam. Gyn malignancy is suspected. Staging evaluation. History of right breast cancer. Dyspnea. EXAM: CT CHEST, ABDOMEN, AND PELVIS WITH CONTRAST TECHNIQUE: Multidetector CT imaging of the chest, abdomen and pelvis was performed following the standard protocol during bolus administration of intravenous contrast. CONTRAST:  165m OMNIPAQUE IOHEXOL 300 MG/ML  SOLN COMPARISON:  07/14/2019 chest radiograph. FINDINGS: CT CHEST FINDINGS Cardiovascular: Normal heart size. No significant pericardial effusion/thickening. Atherosclerotic nonaneurysmal thoracic aorta. Dilated main pulmonary artery (3.5 cm diameter). There is acute bilateral pulmonary embolism including a large saddle embolus. RV/LV ratio 0.90. Mediastinum/Nodes: No discrete thyroid nodules. Unremarkable esophagus. Surgical clips in the right axilla. No pathologically enlarged axillary nodes. Right paratracheal adenopathy up to 2.0 cm short axis diameter (series 2/image 18). Enlarged 2.1 cm AP window node (series 2/image 20). Left prevascular adenopathy up to 1.1 cm (series 2/image 17). Enlarged 1.1 cm left posterior mediastinal node along the descending aorta (series 2/image 26). Bilateral retrocrural adenopathy measuring up to 1.3 cm on the right (series 2/image 46). Lungs/Pleura: No pneumothorax. No pleural effusion. No acute consolidative airspace disease, lung masses or significant pulmonary nodules. Musculoskeletal: No aggressive appearing focal osseous lesions. Marked thoracic spondylosis. CT ABDOMEN PELVIS FINDINGS Hepatobiliary: Normal liver size. Subcentimeter posterior right liver hypodense lesion is too small to characterize (series 2/image 45). No additional liver lesions. Cholelithiasis. Scattered gas within the contracted fundal gallbladder. No definite gallbladder wall thickening or pericholecystic fluid. No biliary ductal dilatation. Pancreas: Normal, with  no mass or duct dilation. Spleen: Normal size. No mass. Adrenals/Urinary Tract: No discrete adrenal nodules. Mild fullness of the central right renal collecting system without overt  right hydronephrosis. No left hydronephrosis. Symmetric contrast nephrograms. Scattered subcentimeter hypodense renal cortical lesions in both kidneys are too small to characterize and require no follow-up. Small parapelvic renal cysts in the left kidney. Normal bladder. Stomach/Bowel: Normal non-distended stomach. Normal caliber small bowel with no small bowel wall thickening. Normal appendix. Oral contrast transits to the left colon. Marked colonic diverticulosis, most prominent in the sigmoid colon, with no definite large bowel wall thickening or significant pericolonic fat stranding. Vascular/Lymphatic: Atherosclerotic nonaneurysmal abdominal aorta. Patent portal, splenic, hepatic and renal veins. IVC filter in place within the infrarenal IVC. Right external iliac vein stent. Extensive retroperitoneal and bilateral pelvic lymphadenopathy involving aortocaval, left para-aortic, bilateral common iliac, bilateral external iliac and right inguinal nodal chains. Representative enlarged 1.8 cm right inguinal node (series 7/image 103), 2.0 cm right external iliac node (series 7/image 90), 2.1 cm right common iliac node (series 7/image 76), 2.0 cm posterior aortocaval node (series 7/image 64) and 1.6 cm left para-aortic node (series 7/image 62). Reproductive: Enlarged uterus. Infiltrative poorly marginated heterogeneously enhancing 7.0 x 4.3 x 5.0 cm midline pelvic mass centered in the region of the vaginal fornix with involvement of the uterine cervix and intimate association with the anterior rectal wall. Scattered gas in the uterine cavity. Apparent 7.5 cm anterior uterine body solid mass, nonspecific, potentially a fibroid. No adnexal masses. Other: No pneumoperitoneum, ascites or focal fluid collection. Musculoskeletal: No aggressive  appearing focal osseous lesions. 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 Chronic appearing osteolytic change at L3-4. IMPRESSION: 1. Acute bilateral pulmonary embolism with large saddle embolus. Dilated main pulmonary artery suggesting pulmonary hypertension. Patient will be brought immediately to the emergency department at [facility] from the lobby of the radiology department per instructions of Dr. Denman George. 2. Infiltrative poorly marginated heterogeneously enhancing 7.0 x 4.3 x 5.0 cm midline pelvic mass centered in the region of the vaginal fornix with involvement of the uterine cervix and with intimate association with the anterior rectal wall, suspicious for malignancy of uncertain primary site. 3. Extensive lymphadenopathy throughout the mediastinum, retroperitoneum and bilateral pelvis, likely metastatic adenopathy. 4. Enlarged uterus with apparent 7.5 cm anterior uterine body solid mass, nonspecific, potentially large uterine fibroid. No adnexal masses. No ascites. 5. Cholelithiasis. Nonspecific gas in the fundal gallbladder. Consider short-term follow-up CT abdomen to exclude emphysematous cholecystitis. No biliary ductal dilatation. 6. Marked colonic diverticulosis. 7.  Aortic Atherosclerosis (ICD10-I70.0). Critical Value/emergent results were called by telephone at the time of interpretation on 09/08/2019 at 3:20 pm to provider EMMA ROSSI, MD, who verbally acknowledged these results. Electronically Signed   By: Ilona Sorrel M.D.   On: 09/08/2019 15:46    Procedures Procedures (including critical care time)  Medications Ordered in ED Medications  0.9 %  sodium chloride infusion ( Intravenous Stopped 09/09/19 1422)  acetaminophen (TYLENOL) tablet 650 mg (has no administration in time range)    Or  acetaminophen (TYLENOL) suppository 650 mg (has no administration in time range)  lisinopril (ZESTRIL) tablet 20 mg (20 mg Oral Given 09/09/19 1238)    rosuvastatin (CRESTOR) tablet 10 mg (10 mg Oral Given 09/09/19 1752)  memantine (NAMENDA) tablet 10 mg (10 mg Oral Given 09/09/19 2102)  sertraline (ZOLOFT) tablet 100 mg (100 mg Oral Given 09/09/19 1239)  acidophilus (RISAQUAD) capsule (1 capsule Oral Given 09/09/19 1236)  nitrofurantoin (MACRODANTIN) capsule 100 mg (100 mg Oral Given 09/09/19 2102)  ferrous sulfate tablet 325 mg (325 mg Oral Given 09/09/19 1752)  cholecalciferol (VITAMIN D)  tablet 5,000 Units (5,000 Units Oral Given 09/09/19 1236)  loratadine (CLARITIN) tablet 10 mg (10 mg Oral Given 09/09/19 1237)  insulin aspart (novoLOG) injection 0-9 Units (1 Units Subcutaneous Given 09/09/19 1756)  insulin aspart (novoLOG) injection 0-5 Units (0 Units Subcutaneous Not Given 09/08/19 2120)  heparin ADULT infusion 100 units/mL (25000 units/251m sodium chloride 0.45%) (1,500 Units/hr Intravenous New Bag/Given 09/09/19 1751)  Chlorhexidine Gluconate Cloth 2 % PADS 6 each (6 each Topical Given 09/09/19 1100)  hydrALAZINE (APRESOLINE) injection 5 mg (5 mg Intravenous Given 09/09/19 2102)   CRITICAL CARE Performed by: DJulianne RiceTotal critical care time:30 minutes Critical care time was exclusive of separately billable procedures and treating other patients. Critical care was necessary to treat or prevent imminent or life-threatening deterioration. Critical care was time spent personally by me on the following activities: development of treatment plan with patient and/or surrogate as well as nursing, discussions with consultants, evaluation of patient's response to treatment, examination of patient, obtaining history from patient or surrogate, ordering and performing treatments and interventions, ordering and review of laboratory studies, ordering and review of radiographic studies, pulse oximetry and re-evaluation of patient's condition.  Initial Impression / Assessment and Plan / ED Course  I have reviewed the triage vital signs and the nursing  notes.  Pertinent labs & imaging results that were available during my care of the patient were reviewed by me and considered in my medical decision making (see chart for details).        Patient maintaining saturations in the mid 90s. Discussed with hospitalist and will admit.  Also spoke with vascular surgery and recommend IV heparin. Final Clinical Impressions(s) / ED Diagnoses   Final diagnoses:  Acute saddle pulmonary embolism without acute cor pulmonale ([facility]    ED Discharge Orders    None       YJulianne Rice MD 09/09/19 2132

## 2019-09-08 NOTE — ED Triage Notes (Signed)
Patient brought to [facility] from outpatient radiology. Patient CT showed saddle PE. Per radiology report, patient also fell in the bathroom earlier, but denies injury. Patient Alert and Oriented x4. Patient SOB with exertion and speaks in short sentences. Patient easily winded.

## 2019-09-08 NOTE — Progress Notes (Signed)
ANTICOAGULATION CONSULT NOTE - Initial Consult  Pharmacy Consult for IV heparin (on apixaban PTA) Indication: pulmonary embolus  Allergies  Allergen Reactions  . Other Shortness Of Breath, Swelling and Other (See Comments)    Cats, dogs, mold Induced asthma Cats, dogs, mold    Patient Measurements: Height: 5' (152.4 cm) Weight: 243 lb (110.2 kg) IBW/kg (Calculated) : 45.5 Heparin Dosing Weight: 72.8  Vital Signs: Temp: 98.8 F (37.1 C) (09/17 1632) BP: 131/58 (09/17 1921) Pulse Rate: 89 (09/17 1921)  Labs: Recent Labs    09/08/19 1725  HGB 9.8*  HCT 33.6*  PLT 282  APTT 35  LABPROT 17.3*  INR 1.4*  CREATININE 0.66    Estimated Creatinine Clearance: 68.5 mL/min (by C-G formula based on SCr of 0.66 mg/dL).   Medical History: Past Medical History:  Diagnosis Date  . Allergic rhinitis, seasonal   . Arm bruise    left arm down to hand from fall 01-05-2019  . Bilateral renal cysts    simple (monitored by dr Karsten Ro)  . Chronic cystitis    urologist-- dr Karsten Ro  . GAD (generalized anxiety disorder)   . History of cancer chemotherapy 09-09-2016  to 11-12-2016   breast cancer  . History of external beam radiation therapy 12-24-2016  to 02-03-2017   right breast  . History of recurrent UTIs   . Humerus fracture 01/05/2019   left ,  hx total shoulder arthroplasty   . Hyperlipidemia   . Hypertension   . IBS (irritable bowel syndrome)    mixed  . Lymphedema of right upper extremity   . 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 Memory disorder 06/22/2017   with intermittant confusion;  neurologist-- dr Krista Blue (note in  epic)  . Mild persistent asthma    no inhaler  . OA (osteoarthritis)   . OSA on CPAP   . Type 2 diabetes, diet controlled ([place])   . Urge incontinence of urine   . Wears glasses     Medications:  Scheduled:  . insulin aspart  0-5 Units Subcutaneous QHS  . [START ON 09/09/2019] insulin aspart  0-9 Units Subcutaneous TID WC   Infusions:  . sodium chloride      Assessment: 78 yo female on apixaban 75m BID prior to admission for hx DVT presents with PE to transition off of apixaban and on IV heparin. Patient took last apixaban this AM 9/17 at 0900; therefore, IV heparin will not be started until ~2100 when next dose of apixaban would have been due. All baseline labs obtained except for heparin level - will order STAT.   Goal of Therapy:  Heparin level 0.3-0.7 units/ml  APTT 66-102 sec Monitor platelets by anticoagulation protocol: Yes   Plan:  1) NO IV Heparin bolus 2) start IV heparin at 1300 units/hr at 2100 3) Check heparin level and aPTT 8 hours after start of IV heparin 4) Daily CBC  LKara Mead9/17/2020,7:48 PM

## 2019-09-09 ENCOUNTER — Other Ambulatory Visit: Payer: Self-pay | Admitting: Oncology

## 2019-09-09 ENCOUNTER — Encounter: Payer: Self-pay | Admitting: Oncology

## 2019-09-09 ENCOUNTER — Inpatient Hospital Stay (HOSPITAL_COMMUNITY): Payer: Medicare PPO

## 2019-09-09 ENCOUNTER — Encounter (HOSPITAL_COMMUNITY): Payer: Self-pay

## 2019-09-09 DIAGNOSIS — R413 Other amnesia: Secondary | ICD-10-CM

## 2019-09-09 DIAGNOSIS — G4733 Obstructive sleep apnea (adult) (pediatric): Secondary | ICD-10-CM

## 2019-09-09 DIAGNOSIS — C541 Malignant neoplasm of endometrium: Secondary | ICD-10-CM

## 2019-09-09 DIAGNOSIS — I2692 Saddle embolus of pulmonary artery without acute cor pulmonale: Principal | ICD-10-CM

## 2019-09-09 DIAGNOSIS — E7800 Pure hypercholesterolemia, unspecified: Secondary | ICD-10-CM

## 2019-09-09 DIAGNOSIS — Z9989 Dependence on other enabling machines and devices: Secondary | ICD-10-CM

## 2019-09-09 DIAGNOSIS — I361 Nonrheumatic tricuspid (valve) insufficiency: Secondary | ICD-10-CM

## 2019-09-09 LAB — GLUCOSE, CAPILLARY
Glucose-Capillary: 105 mg/dL — ABNORMAL HIGH (ref 70–99)
Glucose-Capillary: 128 mg/dL — ABNORMAL HIGH (ref 70–99)
Glucose-Capillary: 116 mg/dL — ABNORMAL HIGH (ref 70–99)

## 2019-09-09 LAB — CBC
HCT: 30.6 % — ABNORMAL LOW (ref 36.0–46.0)
Hemoglobin: 8.9 g/dL — ABNORMAL LOW (ref 12.0–15.0)
MCH: 27.9 pg (ref 26.0–34.0)
MCHC: 29.1 g/dL — ABNORMAL LOW (ref 30.0–36.0)
MCV: 95.9 fL (ref 80.0–100.0)
Platelets: 264 10*3/uL (ref 150–400)
RBC: 3.19 MIL/uL — ABNORMAL LOW (ref 3.87–5.11)
RDW: 15.3 % (ref 11.5–15.5)
WBC: 9.9 10*3/uL (ref 4.0–10.5)
nRBC: 0 % (ref 0.0–0.2)

## 2019-09-09 LAB — HEPARIN LEVEL (UNFRACTIONATED): Heparin Unfractionated: 1.64 IU/mL — ABNORMAL HIGH (ref 0.30–0.70)

## 2019-09-09 LAB — APTT
aPTT: 71 seconds — ABNORMAL HIGH (ref 24–36)
aPTT: 68 seconds — ABNORMAL HIGH (ref 24–36)
aPTT: 67 seconds — ABNORMAL HIGH (ref 24–36)

## 2019-09-09 LAB — CBG MONITORING, ED: Glucose-Capillary: 67 mg/dL — ABNORMAL LOW (ref 70–99)

## 2019-09-09 LAB — COMPREHENSIVE METABOLIC PANEL
ALT: 26 U/L (ref 0–44)
AST: 30 U/L (ref 15–41)
Albumin: 2.6 g/dL — ABNORMAL LOW (ref 3.5–5.0)
Alkaline Phosphatase: 75 U/L (ref 38–126)
Anion gap: 7 (ref 5–15)
BUN: 13 mg/dL (ref 8–23)
CO2: 27 mmol/L (ref 22–32)
Calcium: 8.4 mg/dL — ABNORMAL LOW (ref 8.9–10.3)
Chloride: 106 mmol/L (ref 98–111)
Creatinine, Ser: 0.55 mg/dL (ref 0.44–1.00)
GFR calc Af Amer: >60 mL/min (ref >60)
GFR calc non Af Amer: >60 mL/min (ref >60)
Glucose, Bld: 71 mg/dL (ref 70–99)
Potassium: 4.1 mmol/L (ref 3.5–5.1)
Sodium: 140 mmol/L (ref 135–145)
Total Bilirubin: 0.5 mg/dL (ref 0.3–1.2)
Total Protein: 6.6 g/dL (ref 6.5–8.1)

## 2019-09-09 LAB — TROPONIN I (HIGH SENSITIVITY): Troponin I (High Sensitivity): 8 ng/L (ref <18)

## 2019-09-09 LAB — SARS CORONAVIRUS 2 (TAT 6-24 HRS): SARS Coronavirus 2: NEGATIVE

## 2019-09-09 LAB — ECHOCARDIOGRAM COMPLETE
Height: 60 in
Weight: 3888 oz

## 2019-09-09 LAB — MRSA PCR SCREENING: MRSA by PCR: NEGATIVE

## 2019-09-09 MED ORDER — HYDRALAZINE HCL 20 MG/ML IJ SOLN
5.0000 mg | Freq: Three times a day (TID) | INTRAMUSCULAR | Status: DC | PRN
  Administered 2019-09-09 – 2019-09-10 (×2): 5 mg via INTRAVENOUS
  Filled 2019-09-09 (×2): qty 1

## 2019-09-09 MED ORDER — HEPARIN (PORCINE) 25000 UT/250ML-% IV SOLN
1600.0000 [IU]/h | INTRAVENOUS | Status: DC
  Administered 2019-09-10 – 2019-09-11 (×2): 1600 [IU]/h via INTRAVENOUS
  Filled 2019-09-09 (×2): qty 250

## 2019-09-09 MED ORDER — HEPARIN (PORCINE) 25000 UT/250ML-% IV SOLN
1500.0000 [IU]/h | INTRAVENOUS | Status: DC
  Administered 2019-09-09: 1500 [IU]/h via INTRAVENOUS
  Administered 2019-09-09: 06:00:00 1400 [IU]/h via INTRAVENOUS
  Filled 2019-09-09: qty 250

## 2019-09-09 MED ORDER — CHLORHEXIDINE GLUCONATE CLOTH 2 % EX PADS
6.0000 | MEDICATED_PAD | Freq: Every day | CUTANEOUS | Status: DC
  Administered 2019-09-09 – 2019-09-11 (×3): 6 via TOPICAL

## 2019-09-09 NOTE — ED Notes (Signed)
ED TO INPATIENT HANDOFF REPORT  Name/Age/Gender Tiffany Arnold 78 y.o. female  Code Status    Code Status Orders  (From admission, onward)         Start     Ordered   09/08/19 1940  Full code  Continuous     09/08/19 1941        Code Status History    Date Active Date Inactive Code Status Order ID Comments User Context   08/15/2019 2254 08/17/2019 1633 Full Code [id number]  Nicholes Mango, MD Inpatient   01/27/2019 2013 01/29/2019 1802 Full Code [id number]  Marcellus Scott Inpatient   07/06/2017 1700 07/09/2017 2127 DNR [id number]  Baxter Hire, MD Inpatient   04/27/2017 1745 04/30/2017 1714 Full Code [id number]  Caren Griffins, MD Inpatient   07/29/2016 1949 07/30/2016 1433 Full Code [id number]  Fanny Skates, MD Inpatient   Advance Care Planning Activity      Home/SNF/Other Home  Chief Complaint Pulmonary Emboli and Fall  Level of Care/Admitting Diagnosis ED Disposition    ED Disposition Condition [facility] Area: [facility] [[id number]]  Level of Care: Stepdown [14]  Admit to SDU based on following criteria: Hemodynamic compromise or significant risk of instability:  Patient requiring short term acute titration and management of vasoactive drips, and invasive monitoring (i.e., CVP and Arterial line).  Covid Evaluation: Asymptomatic Screening Protocol (No Symptoms)  Diagnosis: Pulmonary embolism [facility]) [[id number]]  Admitting Physician: Jani Gravel [place]  Attending Physician: Jani Gravel [phone]  Estimated length of stay: past midnight tomorrow  Certification:: I certify this patient will need inpatient services for at least 2 midnights  PT Class (Do Not Modify): Inpatient [101]  PT Acc Code (Do Not Modify): Private [1]       Medical History Past Medical History:  Diagnosis Date  . Allergic rhinitis, seasonal   . Arm bruise    left arm down to hand from fall 01-05-2019  . Bilateral renal cysts    simple (monitored by dr Karsten Ro)  .  Chronic cystitis    urologist-- dr Karsten Ro  . GAD (generalized anxiety disorder)   . History of cancer chemotherapy 09-09-2016  to 11-12-2016   breast cancer  . History of external beam radiation therapy 12-24-2016  to 02-03-2017   right breast  . History of recurrent UTIs   . Humerus fracture 01/05/2019   left ,  hx total shoulder arthroplasty   . Hyperlipidemia   . Hypertension   . IBS (irritable bowel syndrome)    mixed  . Lymphedema of right upper extremity   . 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 Memory disorder 06/22/2017   with intermittant confusion;  neurologist-- dr Krista Blue (note in epic)  . Mild persistent asthma    no inhaler  . OA (osteoarthritis)   . OSA on CPAP   . Type 2 diabetes, diet controlled ([employer])   . Urge incontinence of urine   . Wears glasses     Allergies Allergies  Allergen Reactions  . Other Shortness Of Breath, Swelling and Other (See Comments)    Cats, dogs, mold Induced  asthma Cats, dogs, mold    IV Location/Drains/Wounds Patient Lines/Drains/Airways Status   Active Line/Drains/Airways    Name:   Placement date:   Placement time:   Site:   Days:   Peripheral IV 09/08/19 Left Antecubital   09/08/19    -    Antecubital   1   Peripheral IV 09/09/19 Left;Posterior Forearm   09/09/19    0038    Forearm   less than 1   Incision (Closed) 01/27/19 Arm Left   01/27/19    1759     225   Pressure Injury 06/29/17 Stage II -  Partial thickness loss of dermis presenting as a shallow open ulcer with a red, pink wound bed without slough.   06/29/17    1711     802   Pressure Injury 07/06/17 Stage II -  Partial  thickness loss of dermis presenting as a shallow open ulcer with a red, pink wound bed without slough.   07/06/17    1925     795          Labs/Imaging Results for orders placed or performed during the hospital encounter of 09/08/19 (from the past 48 hour(s))  CBC with Differential/Platelet     Status: Abnormal   Collection Time: 09/08/19  5:25 PM  Result Value Ref Range   WBC 12.0 (H) 4.0 - 10.5 K/uL   RBC 3.47 (L) 3.87 - 5.11 MIL/uL   Hemoglobin 9.8 (L) 12.0 - 15.0 g/dL   HCT 33.6 (L) 36.0 - 46.0 %   MCV 96.8 80.0 - 100.0 fL   MCH 28.2 26.0 - 34.0 pg   MCHC 29.2 (L) 30.0 - 36.0 g/dL   RDW 15.4 11.5 - 15.5 %   Platelets 282 150 - 400 K/uL   nRBC 0.0 0.0 - 0.2 %   Neutrophils Relative % 83 %   Neutro Abs 10.0 (H) 1.7 - 7.7 K/uL   Lymphocytes Relative 10 %   Lymphs Abs 1.1 0.7 - 4.0 K/uL   Monocytes Relative 6 %   Monocytes Absolute 0.7 0.1 - 1.0 K/uL   Eosinophils Relative 0 %   Eosinophils Absolute 0.0 0.0 - 0.5 K/uL   Basophils Relative 0 %   Basophils Absolute 0.0 0.0 - 0.1 K/uL   Immature Granulocytes 1 %   Abs Immature Granulocytes 0.06 0.00 - 0.07 K/uL    Comment: Performed at [facility], [place] [address] [place], [place] [address]  Comprehensive metabolic panel     Status: Abnormal   Collection Time: 09/08/19  5:25 PM  Result Value Ref Range   Sodium 140 135 - 145 mmol/L   Potassium 4.0 3.5 - 5.1 mmol/L   Chloride 102 98 - 111 mmol/L   CO2 28 22 - 32 mmol/L   Glucose, Bld 65 (L) 70 - 99 mg/dL   BUN 13 8 - 23 mg/dL   Creatinine, Ser 0.66 0.44 - 1.00 mg/dL   Calcium 8.9 8.9 - 10.3 mg/dL   Total Protein 7.2 6.5 - 8.1 g/dL   Albumin 2.7 (L) 3.5 - 5.0 g/dL   AST 28 15 - 41 U/L   ALT 28 0 - 44 U/L   Alkaline Phosphatase 83 38 - 126 U/L   Total Bilirubin 0.4 0.3 - 1.2 mg/dL   GFR calc non Af Amer >60 >60 mL/min   GFR calc Af Amer >60 >60 mL/min   Anion gap 10 5 - 15    Comment: Performed at [facility], [place] [address] [place], [place] [address]  Protime-INR     Status: Abnormal   Collection Time: 09/08/19  5:25 PM  Result Value Ref Range   Prothrombin Time 17.3 (H) 11.4 - 15.2 seconds   INR 1.4 (H) 0.8 - 1.2    Comment: (NOTE) INR goal varies based on device and disease states. Performed at [facility], [place] [address], [place], [place] [address]   APTT     Status: None   Collection Time: 09/08/19  5:25 PM  Result Value Ref Range   aPTT 35 24 - 36 seconds    Comment: Performed at [facility], [place] [address], [place], [place] [address]  CBG monitoring, ED     Status: Abnormal   Collection Time: 09/08/19  7:33 PM  Result Value Ref Range   Glucose-Capillary 58 (L) 70 - 99 mg/dL  SARS CORONAVIRUS 2 (TAT 6-24 HRS) Nasopharyngeal Nasopharyngeal Swab     Status: None   Collection Time: 09/08/19  7:47 PM   Specimen: Nasopharyngeal Swab  Result Value Ref Range   SARS Coronavirus 2 NEGATIVE NEGATIVE    Comment: (NOTE) SARS-CoV-2 target nucleic acids are NOT DETECTED. The SARS-CoV-2 RNA is generally detectable in upper and lower respiratory specimens during the acute phase of infection. Negative results do not preclude SARS-CoV-2 infection, do not rule out co-infections with other pathogens, and should not be used as the sole basis for treatment or other patient management decisions. Negative results must be combined with clinical observations, patient history, and epidemiological information. The expected result is Negative. Fact Sheet for Patients: SugarRoll.be Fact Sheet for Healthcare Providers: https://www.woods-mathews.com/ This test is not yet approved or cleared by the [place] FDA and  has been authorized for detection and/or diagnosis of SARS-CoV-2 by FDA under an Emergency Use Authorization (EUA). This EUA will remain  in effect (meaning this test can be used) for the duration of the COVID-19 declaration  under Section 56 4(b)(1) of the Act, 21 U.S.C. section 360bbb-3(b)(1), unless the authorization is terminated or revoked sooner. Performed at [facility], [place] [address], [place], [place] [address]   CBG monitoring, ED     Status: None   Collection Time: 09/08/19  8:12 PM  Result Value Ref Range   Glucose-Capillary 81 70 - 99 mg/dL  Heparin level (unfractionated)     Status: Abnormal   Collection Time: 09/08/19  8:19 PM  Result Value Ref Range   Heparin Unfractionated >2.00 (H) 0.30 - 0.70 IU/mL    Comment: RESULTS CONFIRMED BY MANUAL DILUTION (NOTE) If heparin results are below expected values, and patient dosage has  been confirmed, suggest follow up testing of antithrombin III levels. Performed at [facility], [place] [address], [place], [place] [address]   CBG monitoring, ED     Status: Abnormal   Collection Time: 09/08/19  9:20 PM  Result Value Ref Range   Glucose-Capillary 101 (H) 70 - 99 mg/dL  CBG monitoring, ED     Status: None   Collection Time: 09/08/19 10:42 PM  Result Value Ref Range   Glucose-Capillary 90 70 - 99 mg/dL  Troponin I (High Sensitivity)     Status: None   Collection Time: 09/08/19 11:18 PM  Result Value Ref Range   Troponin I (High Sensitivity) 8 <18 ng/L    Comment: (NOTE) Elevated high sensitivity troponin I (hsTnI) values and significant  changes across serial measurements may suggest ACS but many  other  chronic and acute conditions are known to elevate hsTnI results.  Refer to the "Links" section for chest pain algorithms and additional  guidance. Performed at [facility], [place] [address] [place], [place] [address]   Troponin I (High Sensitivity)     Status: None   Collection Time: 09/09/19  3:30 AM  Result Value Ref Range   Troponin I (High Sensitivity) 8 <18 ng/L    Comment: (NOTE) Elevated high sensitivity troponin I (hsTnI) values and significant  changes across serial measurements may  suggest ACS but many other  chronic and acute conditions are known to elevate hsTnI results.  Refer to the "Links" section for chest pain algorithms and additional  guidance. Performed at [facility], [place] [address] [place], [place] [address]   Comprehensive metabolic panel     Status: Abnormal   Collection Time: 09/09/19  4:40 AM  Result Value Ref Range   Sodium 140 135 - 145 mmol/L   Potassium 4.1 3.5 - 5.1 mmol/L   Chloride 106 98 - 111 mmol/L   CO2 27 22 - 32 mmol/L   Glucose, Bld 71 70 - 99 mg/dL   BUN 13 8 - 23 mg/dL   Creatinine, Ser 0.55 0.44 - 1.00 mg/dL   Calcium 8.4 (L) 8.9 - 10.3 mg/dL   Total Protein 6.6 6.5 - 8.1 g/dL   Albumin 2.6 (L) 3.5 - 5.0 g/dL   AST 30 15 - 41 U/L   ALT 26 0 - 44 U/L   Alkaline Phosphatase 75 38 - 126 U/L   Total Bilirubin 0.5 0.3 - 1.2 mg/dL   GFR calc non Af Amer >60 >60 mL/min   GFR calc Af Amer >60 >60 mL/min   Anion gap 7 5 - 15    Comment: Performed at [facility], [place] [address], [place], [place] [address]  CBC     Status: Abnormal   Collection Time: 09/09/19  4:40 AM  Result Value Ref Range   WBC 9.9 4.0 - 10.5 K/uL   RBC 3.19 (L) 3.87 - 5.11 MIL/uL   Hemoglobin 8.9 (L) 12.0 - 15.0 g/dL   HCT 30.6 (L) 36.0 - 46.0 %   MCV 95.9 80.0 - 100.0 fL   MCH 27.9 26.0 - 34.0 pg   MCHC 29.1 (L) 30.0 - 36.0 g/dL   RDW 15.3 11.5 - 15.5 %   Platelets 264 150 - 400 K/uL   nRBC 0.0 0.0 - 0.2 %    Comment: Performed at [facility], [place] [address], [place], [place] [address]  Heparin level (unfractionated)     Status: Abnormal   Collection Time: 09/09/19  4:40 AM  Result Value Ref Range   Heparin Unfractionated 1.64 (H) 0.30 - 0.70 IU/mL    Comment: RESULTS CONFIRMED BY MANUAL DILUTION Performed at [place] [address] [place], [place] [address]   APTT     Status: Abnormal   Collection Time: 09/09/19  4:40 AM  Result Value Ref Range   aPTT 71 (H) 24 -  36 seconds    Comment:        IF BASELINE aPTT IS ELEVATED, SUGGEST PATIENT RISK ASSESSMENT BE USED TO DETERMINE APPROPRIATE ANTICOAGULANT THERAPY. Performed at [facility], [place] [address], [place], [place] [address]    Ct Head Wo Contrast  Result Date: 09/08/2019 CLINICAL DATA:  [place] in bathroom recent CT with positive PE EXAM: CT HEAD WITHOUT CONTRAST  TECHNIQUE: Contiguous axial images were obtained from the base of the skull through the vertex without intravenous contrast. COMPARISON:  CT 09/08/2019, 04/13/2019 FINDINGS: Brain: No acute territorial infarction, hemorrhage or intracranial mass. Atrophy and moderate hypodensity in the white matter consistent with small vessel ischemic change. Stable ventricle size Vascular: No hyperdense vessels.  Carotid vascular calcification Skull: Normal. Negative for fracture or focal lesion. Sinuses/Orbits: Postsurgical changes of the maxillary and ethmoid sinuses with mild mucosal thickening. Other: Small left forehead soft tissue swelling IMPRESSION: 1. No CT evidence for acute intracranial abnormality. 2. Atrophy and small vessel ischemic changes of white matter. Electronically Signed   By: Donavan Foil M.D.   On: 09/08/2019 19:11   Ct Chest W Contrast  Result Date: 09/08/2019 CLINICAL DATA:  Uterine and cervical mass on exam. Gyn malignancy is suspected. Staging evaluation. History of right breast cancer. Dyspnea. EXAM: CT CHEST, ABDOMEN, AND PELVIS WITH CONTRAST TECHNIQUE: Multidetector CT imaging of the chest, abdomen and pelvis was performed following the standard protocol during bolus administration of intravenous contrast. CONTRAST:  135m OMNIPAQUE IOHEXOL 300 MG/ML  SOLN COMPARISON:  07/14/2019 chest radiograph. FINDINGS: CT CHEST FINDINGS Cardiovascular: Normal heart size. No significant pericardial effusion/thickening. Atherosclerotic nonaneurysmal thoracic aorta. Dilated main pulmonary artery (3.5 cm diameter). There is acute  bilateral pulmonary embolism including a large saddle embolus. RV/LV ratio 0.90. Mediastinum/Nodes: No discrete thyroid nodules. Unremarkable esophagus. Surgical clips in the right axilla. No pathologically enlarged axillary nodes. Right paratracheal adenopathy up to 2.0 cm short axis diameter (series 2/image 18). Enlarged 2.1 cm AP window node (series 2/image 20). Left prevascular adenopathy up to 1.1 cm (series 2/image 17). Enlarged 1.1 cm left posterior mediastinal node along the descending aorta (series 2/image 26). Bilateral retrocrural adenopathy measuring up to 1.3 cm on the right (series 2/image 46). Lungs/Pleura: No pneumothorax. No pleural effusion. No acute consolidative airspace disease, lung masses or significant pulmonary nodules. Musculoskeletal: No aggressive appearing focal osseous lesions. Marked thoracic spondylosis. CT ABDOMEN PELVIS FINDINGS Hepatobiliary: Normal liver size. Subcentimeter posterior right liver hypodense lesion is too small to characterize (series 2/image 45). No additional liver lesions. Cholelithiasis. Scattered gas within the contracted fundal gallbladder. No definite gallbladder wall thickening or pericholecystic fluid. No biliary ductal dilatation. Pancreas: Normal, with no mass or duct dilation. Spleen: Normal size. No mass. Adrenals/Urinary Tract: No discrete adrenal nodules. Mild fullness of the central right renal collecting system without overt right hydronephrosis. No left hydronephrosis. Symmetric contrast nephrograms. Scattered subcentimeter hypodense renal cortical lesions in both kidneys are too small to characterize and require no follow-up. Small parapelvic renal cysts in the left kidney. Normal bladder. Stomach/Bowel: Normal non-distended stomach. Normal caliber small bowel with no small bowel wall thickening. Normal appendix. Oral contrast transits to the left colon. Marked colonic diverticulosis, most prominent in the sigmoid colon, with no definite large  bowel wall thickening or significant pericolonic fat stranding. Vascular/Lymphatic: Atherosclerotic nonaneurysmal abdominal aorta. Patent portal, splenic, hepatic and renal veins. IVC filter in place within the infrarenal IVC. Right external iliac vein stent. Extensive retroperitoneal and bilateral pelvic lymphadenopathy involving aortocaval, left para-aortic, bilateral common iliac, bilateral external iliac and right inguinal nodal chains. Representative enlarged 1.8 cm right inguinal node (series 7/image 103), 2.0 cm right external iliac node (series 7/image 90), 2.1 cm right common iliac node (series 7/image 76), 2.0 cm posterior aortocaval node (series 7/image 64) and 1.6 cm left para-aortic node (series 7/image 62). Reproductive: Enlarged uterus. Infiltrative poorly marginated heterogeneously enhancing 7.0 x 4.3 x 5.0 cm midline  pelvic mass centered in the region of the vaginal fornix with involvement of the uterine cervix and intimate association with the anterior rectal wall. Scattered gas in the uterine cavity. Apparent 7.5 cm anterior uterine body solid mass, nonspecific, potentially a fibroid. No adnexal masses. Other: No pneumoperitoneum, ascites or focal fluid collection. Musculoskeletal: No aggressive appearing focal osseous lesions. 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 Chronic appearing osteolytic change at L3-4. IMPRESSION: 1. Acute bilateral pulmonary embolism with large saddle embolus. Dilated main pulmonary artery suggesting pulmonary hypertension. Patient will be brought immediately to the emergency department at [facility] from the lobby of the radiology department per instructions of Dr. Denman George. 2. Infiltrative poorly marginated heterogeneously enhancing 7.0 x 4.3 x 5.0 cm midline pelvic mass centered in the region of the vaginal fornix with involvement of the uterine cervix and with intimate association with the anterior rectal wall,  suspicious for malignancy of uncertain primary site. 3. Extensive lymphadenopathy throughout the mediastinum, retroperitoneum and bilateral pelvis, likely metastatic adenopathy. 4. Enlarged uterus with apparent 7.5 cm anterior uterine body solid mass, nonspecific, potentially large uterine fibroid. No adnexal masses. No ascites. 5. Cholelithiasis. Nonspecific gas in the fundal gallbladder. Consider short-term follow-up CT abdomen to exclude emphysematous cholecystitis. No biliary ductal dilatation. 6. Marked colonic diverticulosis. 7.  Aortic Atherosclerosis (ICD10-I70.0). Critical Value/emergent results were called by telephone at the time of interpretation on 09/08/2019 at 3:20 pm to provider EMMA ROSSI, MD, who verbally acknowledged these results. Electronically Signed   By: Ilona Sorrel M.D.   On: 09/08/2019 15:46   Ct Abdomen Pelvis W Contrast  Result Date: 09/08/2019 CLINICAL DATA:  Uterine and cervical mass on exam. Gyn malignancy is suspected. Staging evaluation. History of right breast cancer. Dyspnea. EXAM: CT CHEST, ABDOMEN, AND PELVIS WITH CONTRAST TECHNIQUE: Multidetector CT imaging of the chest, abdomen and pelvis was performed following the standard protocol during bolus administration of intravenous contrast. CONTRAST:  157m OMNIPAQUE IOHEXOL 300 MG/ML  SOLN COMPARISON:  07/14/2019 chest radiograph. FINDINGS: CT CHEST FINDINGS Cardiovascular: Normal heart size. No significant pericardial effusion/thickening. Atherosclerotic nonaneurysmal thoracic aorta. Dilated main pulmonary artery (3.5 cm diameter). There is acute bilateral pulmonary embolism including a large saddle embolus. RV/LV ratio 0.90. Mediastinum/Nodes: No discrete thyroid nodules. Unremarkable esophagus. Surgical clips in the right axilla. No pathologically enlarged axillary nodes. Right paratracheal adenopathy up to 2.0 cm short axis diameter (series 2/image 18). Enlarged 2.1 cm AP window node (series 2/image 20). Left prevascular  adenopathy up to 1.1 cm (series 2/image 17). Enlarged 1.1 cm left posterior mediastinal node along the descending aorta (series 2/image 26). Bilateral retrocrural adenopathy measuring up to 1.3 cm on the right (series 2/image 46). Lungs/Pleura: No pneumothorax. No pleural effusion. No acute consolidative airspace disease, lung masses or significant pulmonary nodules. Musculoskeletal: No aggressive appearing focal osseous lesions. Marked thoracic spondylosis. CT ABDOMEN PELVIS FINDINGS Hepatobiliary: Normal liver size. Subcentimeter posterior right liver hypodense lesion is too small to characterize (series 2/image 45). No additional liver lesions. Cholelithiasis. Scattered gas within the contracted fundal gallbladder. No definite gallbladder wall thickening or pericholecystic fluid. No biliary ductal dilatation. Pancreas: Normal, with no mass or duct dilation. Spleen: Normal size. No mass. Adrenals/Urinary Tract: No discrete adrenal nodules. Mild fullness of the central right renal collecting system without overt right hydronephrosis. No left hydronephrosis. Symmetric contrast nephrograms. Scattered subcentimeter hypodense renal cortical lesions in both kidneys are too small to characterize and require no follow-up. Small parapelvic renal cysts in the left kidney.  Normal bladder. Stomach/Bowel: Normal non-distended stomach. Normal caliber small bowel with no small bowel wall thickening. Normal appendix. Oral contrast transits to the left colon. Marked colonic diverticulosis, most prominent in the sigmoid colon, with no definite large bowel wall thickening or significant pericolonic fat stranding. Vascular/Lymphatic: Atherosclerotic nonaneurysmal abdominal aorta. Patent portal, splenic, hepatic and renal veins. IVC filter in place within the infrarenal IVC. Right external iliac vein stent. Extensive retroperitoneal and bilateral pelvic lymphadenopathy involving aortocaval, left para-aortic, bilateral common iliac,  bilateral external iliac and right inguinal nodal chains. Representative enlarged 1.8 cm right inguinal node (series 7/image 103), 2.0 cm right external iliac node (series 7/image 90), 2.1 cm right common iliac node (series 7/image 76), 2.0 cm posterior aortocaval node (series 7/image 64) and 1.6 cm left para-aortic node (series 7/image 62). Reproductive: Enlarged uterus. Infiltrative poorly marginated heterogeneously enhancing 7.0 x 4.3 x 5.0 cm midline pelvic mass centered in the region of the vaginal fornix with involvement of the uterine cervix and intimate association with the anterior rectal wall. Scattered gas in the uterine cavity. Apparent 7.5 cm anterior uterine body solid mass, nonspecific, potentially a fibroid. No adnexal masses. Other: No pneumoperitoneum, ascites or focal fluid collection. Musculoskeletal: No aggressive appearing focal osseous lesions. 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 Chronic appearing osteolytic change at L3-4. IMPRESSION: 1. Acute bilateral pulmonary embolism with large saddle embolus. Dilated main pulmonary artery suggesting pulmonary hypertension. Patient will be brought immediately to the emergency department at [facility] from the lobby of the radiology department per instructions of Dr. Denman George. 2. Infiltrative poorly marginated heterogeneously enhancing 7.0 x 4.3 x 5.0 cm midline pelvic mass centered in the region of the vaginal fornix with involvement of the uterine cervix and with intimate association with the anterior rectal wall, suspicious for malignancy of uncertain primary site. 3. Extensive lymphadenopathy throughout the mediastinum, retroperitoneum and bilateral pelvis, likely metastatic adenopathy. 4. Enlarged uterus with apparent 7.5 cm anterior uterine body solid mass, nonspecific, potentially large uterine fibroid. No adnexal masses. No ascites. 5. Cholelithiasis. Nonspecific gas in the fundal gallbladder.  Consider short-term follow-up CT abdomen to exclude emphysematous cholecystitis. No biliary ductal dilatation. 6. Marked colonic diverticulosis. 7.  Aortic Atherosclerosis (ICD10-I70.0). Critical Value/emergent results were called by telephone at the time of interpretation on 09/08/2019 at 3:20 pm to provider EMMA ROSSI, MD, who verbally acknowledged these results. Electronically Signed   By: Ilona Sorrel M.D.   On: 09/08/2019 15:46    Pending Labs Unresulted Labs (From admission, onward)    Start     Ordered   09/09/19 1400  APTT  Once-Timed,   STAT     09/09/19 0536          Vitals/Pain Today's Vitals   09/09/19 0702 09/09/19 0800 09/09/19 0830 09/09/19 0900  BP: (!) 156/69 (!) 157/75 (!) 150/76 (!) 160/77  Pulse: 94 86 86 93  Resp: 14 (!) 23 13 (!) 21  Temp:      SpO2: 97% 94% 94% 96%  Weight:      Height:      PainSc:        Isolation Precautions No active isolations  Medications Medications  0.9 %  sodium chloride infusion ( Intravenous New Bag/Given 09/09/19 0054)  acetaminophen (TYLENOL) tablet 650 mg (has no administration in time range)    Or  acetaminophen (TYLENOL) suppository 650 mg (has no administration in time range)  lisinopril (ZESTRIL) tablet 20 mg (has no administration in time range)  rosuvastatin (CRESTOR) tablet 10 mg (has no administration in time range)  memantine (NAMENDA) tablet 10 mg (has no administration in time range)  sertraline (ZOLOFT) tablet 100 mg (has no administration in time range)  acidophilus (RISAQUAD) capsule (has no administration in time range)  nitrofurantoin (MACRODANTIN) capsule 100 mg (has no administration in time range)  ferrous sulfate tablet 325 mg (has no administration in time range)  cholecalciferol (VITAMIN D) tablet 5,000 Units (has no administration in time range)  loratadine (CLARITIN) tablet 10 mg (has no administration in time range)  insulin aspart (novoLOG) injection 0-9 Units (0 Units Subcutaneous Not Given  09/09/19 0845)  insulin aspart (novoLOG) injection 0-5 Units (0 Units Subcutaneous Not Given 09/08/19 2120)  heparin ADULT infusion 100 units/mL (25000 units/240m sodium chloride 0.45%) (1,400 Units/hr Intravenous New Bag/Given 09/09/19 0602)    Mobility walks

## 2019-09-09 NOTE — Progress Notes (Addendum)
PROGRESS NOTE    KERRILYNN CURATOLA  [id number] DOB: June 21, 1941 DOA: 09/08/2019 PCP: [name], NP   Brief Narrative:  Ahrianna Glassco  is a 78 y.o. 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 New pelvic mass, apparently had a CT scan to w/up her new pelvic mass and pulmonary embolism was seen. Pt sent to ED for evaluation.    Assessment & Plan:   Principal Problem:   Pulmonary embolism ([place]) Active Problems:   Essential hypertension   HLD (hyperlipidemia)   Type 2 diabetes mellitus without complication (HCC)   OSA on CPAP   Memory disorder   PE Tele Trop I q3h x2 F/up cardiac echo Failed Eliquis Heparin GTT Discussed with HemOnc Dr Burr Medico who will see in consultation Discussed with IR and reviewed CT with them, it was thought given pt significant co-morbidities and hemodynamic stability, advised against intervention 2/2 risk vs benefit evalaution  New Pelvic Mass Oncology as above  Dm2, w hypoglycemia Continue to hold Glipizide C/w ADA diet fsbs ac and qhs, ISS  Hypertension Cont Lisinopril 20mg  po qday Monitor BP  Memory disorder Cont Namenda 10mg  po bid  Anxiety Cont Zoloft 100mg  po qday  DVT prophylaxis: On:HEP GTT  Code Status: fULL    Code Status Orders  (From admission, onward)         Start     Ordered   09/08/19 1940  Full code  Continuous     09/08/19 1941        Code Status History    Date Active Date Inactive Code Status Order ID Comments User Context   08/15/2019 2254 08/17/2019 1633 Full Code [id number]  Nicholes Mango, MD Inpatient   01/27/2019 2013 01/29/2019 1802 Full Code [id number]  Marcellus Scott Inpatient   07/06/2017 1700  07/09/2017 2127 DNR [id number]  Baxter Hire, MD Inpatient   04/27/2017 1745 04/30/2017 1714 Full Code [id number]  Caren Griffins, MD Inpatient   07/29/2016 1949 07/30/2016 1433 Full Code [id number]  Fanny Skates, MD Inpatient   Advance Care Planning Activity     Family Communication: DISCUSSED IN DETAIL WITH PATIENT  Disposition Plan:   Patient remained inpatient on heparin drip, hematology oncology consulting secondary to new pelvic mass, Consults called: HEM ONC Admission status: Inpatient   Consultants:   GYN ONC, HEM ONC  Procedures:  Ct Head Wo Contrast  Result Date: 09/08/2019 CLINICAL DATA:  [place] in bathroom recent CT with positive PE EXAM: CT HEAD WITHOUT CONTRAST TECHNIQUE: Contiguous axial images were obtained from the base of the skull through the vertex without intravenous contrast. COMPARISON:  CT 09/08/2019, 04/13/2019 FINDINGS: Brain: No acute territorial infarction, hemorrhage or intracranial mass. Atrophy and moderate hypodensity in the white matter consistent with small vessel ischemic change. Stable ventricle size Vascular: No hyperdense vessels.  Carotid vascular calcification Skull: Normal. Negative for fracture or focal lesion. Sinuses/Orbits: Postsurgical changes of the maxillary and ethmoid sinuses with mild mucosal thickening. Other: Small left forehead soft tissue swelling IMPRESSION: 1. No CT evidence for acute intracranial abnormality. 2. Atrophy and small vessel ischemic changes of white matter. Electronically Signed   By: Donavan Foil M.D.   On: 09/08/2019 19:11  Ct Chest W Contrast  Result Date: 09/08/2019 CLINICAL DATA:  Uterine and cervical mass on exam. Gyn malignancy is suspected. Staging evaluation. History of right breast cancer. Dyspnea. EXAM: CT CHEST, ABDOMEN, AND PELVIS WITH CONTRAST TECHNIQUE: Multidetector CT imaging of the chest, abdomen and pelvis was performed following the standard protocol during bolus administration of intravenous contrast.  CONTRAST:  175mL OMNIPAQUE IOHEXOL 300 MG/ML  SOLN COMPARISON:  07/14/2019 chest radiograph. FINDINGS: CT CHEST FINDINGS Cardiovascular: Normal heart size. No significant pericardial effusion/thickening. Atherosclerotic nonaneurysmal thoracic aorta. Dilated main pulmonary artery (3.5 cm diameter). There is acute bilateral pulmonary embolism including a large saddle embolus. RV/LV ratio 0.90. Mediastinum/Nodes: No discrete thyroid nodules. Unremarkable esophagus. Surgical clips in the right axilla. No pathologically enlarged axillary nodes. Right paratracheal adenopathy up to 2.0 cm short axis diameter (series 2/image 18). Enlarged 2.1 cm AP window node (series 2/image 20). Left prevascular adenopathy up to 1.1 cm (series 2/image 17). Enlarged 1.1 cm left posterior mediastinal node along the descending aorta (series 2/image 26). Bilateral retrocrural adenopathy measuring up to 1.3 cm on the right (series 2/image 46). Lungs/Pleura: No pneumothorax. No pleural effusion. No acute consolidative airspace disease, lung masses or significant pulmonary nodules. Musculoskeletal: No aggressive appearing focal osseous lesions. Marked thoracic spondylosis. CT ABDOMEN PELVIS FINDINGS Hepatobiliary: Normal liver size. Subcentimeter posterior right liver hypodense lesion is too small to characterize (series 2/image 45). No additional liver lesions. Cholelithiasis. Scattered gas within the contracted fundal gallbladder. No definite gallbladder wall thickening or pericholecystic fluid. No biliary ductal dilatation. Pancreas: Normal, with no mass or duct dilation. Spleen: Normal size. No mass. Adrenals/Urinary Tract: No discrete adrenal nodules. Mild fullness of the central right renal collecting system without overt right hydronephrosis. No left hydronephrosis. Symmetric contrast nephrograms. Scattered subcentimeter hypodense renal cortical lesions in both kidneys are too small to characterize and require no follow-up. Small  parapelvic renal cysts in the left kidney. Normal bladder. Stomach/Bowel: Normal non-distended stomach. Normal caliber small bowel with no small bowel wall thickening. Normal appendix. Oral contrast transits to the left colon. Marked colonic diverticulosis, most prominent in the sigmoid colon, with no definite large bowel wall thickening or significant pericolonic fat stranding. Vascular/Lymphatic: Atherosclerotic nonaneurysmal abdominal aorta. Patent portal, splenic, hepatic and renal veins. IVC filter in place within the infrarenal IVC. Right external iliac vein stent. Extensive retroperitoneal and bilateral pelvic lymphadenopathy involving aortocaval, left para-aortic, bilateral common iliac, bilateral external iliac and right inguinal nodal chains. Representative enlarged 1.8 cm right inguinal node (series 7/image 103), 2.0 cm right external iliac node (series 7/image 90), 2.1 cm right common iliac node (series 7/image 76), 2.0 cm posterior aortocaval node (series 7/image 64) and 1.6 cm left para-aortic node (series 7/image 62). Reproductive: Enlarged uterus. Infiltrative poorly marginated heterogeneously enhancing 7.0 x 4.3 x 5.0 cm midline pelvic mass centered in the region of the vaginal fornix with involvement of the uterine cervix and intimate association with the anterior rectal wall. Scattered gas in the uterine cavity. Apparent 7.5 cm anterior uterine body solid mass, nonspecific, potentially a fibroid. No adnexal masses. Other: No pneumoperitoneum, ascites or focal fluid collection. Musculoskeletal: No aggressive appearing focal osseous lesions. 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 Chronic appearing osteolytic change at L3-4. IMPRESSION: 1. Acute bilateral pulmonary embolism with large saddle embolus. Dilated main pulmonary artery suggesting pulmonary hypertension. Patient will be brought immediately to the emergency department at [facility] from the  lobby of the radiology department per instructions of Dr. Denman George. 2.  Infiltrative poorly marginated heterogeneously enhancing 7.0 x 4.3 x 5.0 cm midline pelvic mass centered in the region of the vaginal fornix with involvement of the uterine cervix and with intimate association with the anterior rectal wall, suspicious for malignancy of uncertain primary site. 3. Extensive lymphadenopathy throughout the mediastinum, retroperitoneum and bilateral pelvis, likely metastatic adenopathy. 4. Enlarged uterus with apparent 7.5 cm anterior uterine body solid mass, nonspecific, potentially large uterine fibroid. No adnexal masses. No ascites. 5. Cholelithiasis. Nonspecific gas in the fundal gallbladder. Consider short-term follow-up CT abdomen to exclude emphysematous cholecystitis. No biliary ductal dilatation. 6. Marked colonic diverticulosis. 7.  Aortic Atherosclerosis (ICD10-I70.0). Critical Value/emergent results were called by telephone at the time of interpretation on 09/08/2019 at 3:20 pm to provider EMMA ROSSI, MD, who verbally acknowledged these results. Electronically Signed   By: Ilona Sorrel M.D.   On: 09/08/2019 15:46   Ct Abdomen Pelvis W Contrast  Result Date: 09/08/2019 CLINICAL DATA:  Uterine and cervical mass on exam. Gyn malignancy is suspected. Staging evaluation. History of right breast cancer. Dyspnea. EXAM: CT CHEST, ABDOMEN, AND PELVIS WITH CONTRAST TECHNIQUE: Multidetector CT imaging of the chest, abdomen and pelvis was performed following the standard protocol during bolus administration of intravenous contrast. CONTRAST:  120mL OMNIPAQUE IOHEXOL 300 MG/ML  SOLN COMPARISON:  07/14/2019 chest radiograph. FINDINGS: CT CHEST FINDINGS Cardiovascular: Normal heart size. No significant pericardial effusion/thickening. Atherosclerotic nonaneurysmal thoracic aorta. Dilated main pulmonary artery (3.5 cm diameter). There is acute bilateral pulmonary embolism including a large saddle embolus. RV/LV ratio  0.90. Mediastinum/Nodes: No discrete thyroid nodules. Unremarkable esophagus. Surgical clips in the right axilla. No pathologically enlarged axillary nodes. Right paratracheal adenopathy up to 2.0 cm short axis diameter (series 2/image 18). Enlarged 2.1 cm AP window node (series 2/image 20). Left prevascular adenopathy up to 1.1 cm (series 2/image 17). Enlarged 1.1 cm left posterior mediastinal node along the descending aorta (series 2/image 26). Bilateral retrocrural adenopathy measuring up to 1.3 cm on the right (series 2/image 46). Lungs/Pleura: No pneumothorax. No pleural effusion. No acute consolidative airspace disease, lung masses or significant pulmonary nodules. Musculoskeletal: No aggressive appearing focal osseous lesions. Marked thoracic spondylosis. CT ABDOMEN PELVIS FINDINGS Hepatobiliary: Normal liver size. Subcentimeter posterior right liver hypodense lesion is too small to characterize (series 2/image 45). No additional liver lesions. Cholelithiasis. Scattered gas within the contracted fundal gallbladder. No definite gallbladder wall thickening or pericholecystic fluid. No biliary ductal dilatation. Pancreas: Normal, with no mass or duct dilation. Spleen: Normal size. No mass. Adrenals/Urinary Tract: No discrete adrenal nodules. Mild fullness of the central right renal collecting system without overt right hydronephrosis. No left hydronephrosis. Symmetric contrast nephrograms. Scattered subcentimeter hypodense renal cortical lesions in both kidneys are too small to characterize and require no follow-up. Small parapelvic renal cysts in the left kidney. Normal bladder. Stomach/Bowel: Normal non-distended stomach. Normal caliber small bowel with no small bowel wall thickening. Normal appendix. Oral contrast transits to the left colon. Marked colonic diverticulosis, most prominent in the sigmoid colon, with no definite large bowel wall thickening or significant pericolonic fat stranding.  Vascular/Lymphatic: Atherosclerotic nonaneurysmal abdominal aorta. Patent portal, splenic, hepatic and renal veins. IVC filter in place within the infrarenal IVC. Right external iliac vein stent. Extensive retroperitoneal and bilateral pelvic lymphadenopathy involving aortocaval, left para-aortic, bilateral common iliac, bilateral external iliac and right inguinal nodal chains. Representative enlarged 1.8 cm right inguinal node (series 7/image 103), 2.0 cm right external iliac node (series 7/image 90), 2.1 cm right common iliac node (series 7/image 76),  2.0 cm posterior aortocaval node (series 7/image 64) and 1.6 cm left para-aortic node (series 7/image 62). Reproductive: Enlarged uterus. Infiltrative poorly marginated heterogeneously enhancing 7.0 x 4.3 x 5.0 cm midline pelvic mass centered in the region of the vaginal fornix with involvement of the uterine cervix and intimate association with the anterior rectal wall. Scattered gas in the uterine cavity. Apparent 7.5 cm anterior uterine body solid mass, nonspecific, potentially a fibroid. No adnexal masses. Other: No pneumoperitoneum, ascites or focal fluid collection. Musculoskeletal: No aggressive appearing focal osseous lesions. 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 Chronic appearing osteolytic change at L3-4. IMPRESSION: 1. Acute bilateral pulmonary embolism with large saddle embolus. Dilated main pulmonary artery suggesting pulmonary hypertension. Patient will be brought immediately to the emergency department at [facility] from the lobby of the radiology department per instructions of Dr. Denman George. 2. Infiltrative poorly marginated heterogeneously enhancing 7.0 x 4.3 x 5.0 cm midline pelvic mass centered in the region of the vaginal fornix with involvement of the uterine cervix and with intimate association with the anterior rectal wall, suspicious for malignancy of uncertain primary site. 3. Extensive  lymphadenopathy throughout the mediastinum, retroperitoneum and bilateral pelvis, likely metastatic adenopathy. 4. Enlarged uterus with apparent 7.5 cm anterior uterine body solid mass, nonspecific, potentially large uterine fibroid. No adnexal masses. No ascites. 5. Cholelithiasis. Nonspecific gas in the fundal gallbladder. Consider short-term follow-up CT abdomen to exclude emphysematous cholecystitis. No biliary ductal dilatation. 6. Marked colonic diverticulosis. 7.  Aortic Atherosclerosis (ICD10-I70.0). Critical Value/emergent results were called by telephone at the time of interpretation on 09/08/2019 at 3:20 pm to provider EMMA ROSSI, MD, who verbally acknowledged these results. Electronically Signed   By: Ilona Sorrel M.D.   On: 09/08/2019 15:46   US Venous Img Lower Unilateral Right  Result Date: 08/15/2019 CLINICAL DATA:  78 year old female with a history of right leg swelling for a week EXAM: RIGHT LOWER EXTREMITY VENOUS DOPPLER ULTRASOUND TECHNIQUE: Gray-scale sonography with graded compression, as well as color Doppler and duplex ultrasound were performed to evaluate the lower extremity deep venous systems from the level of the common femoral vein and including the common femoral, femoral, profunda femoral, popliteal and calf veins including the posterior tibial, peroneal and gastrocnemius veins when visible. The superficial great saphenous vein was also interrogated. Spectral Doppler was utilized to evaluate flow at rest and with distal augmentation maneuvers in the common femoral, femoral and popliteal veins. COMPARISON:  None. FINDINGS: Contralateral Common Femoral Vein: Respiratory phasicity is normal and symmetric with the symptomatic side. No evidence of thrombus. Normal compressibility. DVT involving the common femoral vein, femoral vein, popliteal vein, into the peroneal vein and posterior tibial vein. Thrombus extends into the saphenofemoral junction as well as into the profunda vein.  Thrombus at the common femoral vein is partially occlusive. Other Findings:  None. IMPRESSION: Sonographic survey of the right lower extremity positive for acute DVT of the common femoral vein, profunda vein, saphenofemoral junction, and through the length of the femoral vein, popliteal vein, into the tibial veins. The thrombus at the common femoral vein is partially occlusive, and uncertain to what degree thrombus extends into the pelvis. Electronically Signed   By: Corrie Mckusick D.O.   On: 08/15/2019 15:57   US Pelvic Complete With Transvaginal  Result Date: 08/25/2019 CLINICAL DATA:  Postmenopausal bleeding EXAM: TRANSABDOMINAL AND TRANSVAGINAL ULTRASOUND OF PELVIS TECHNIQUE: Both transabdominal and transvaginal ultrasound examinations of the pelvis were performed. Transabdominal technique was performed for global  imaging of the pelvis including uterus, ovaries, adnexal regions, and pelvic cul-de-sac. It was necessary to proceed with endovaginal exam following the transabdominal exam to visualize the ovaries. COMPARISON:  None FINDINGS: Uterus Measurements: 8.9 x 5.8 x 7.0 cm. = volume: 190 mL. A 6.9 x 7.0 cm heterogeneous mass is noted in the region of the uterine fundus. Additionally there is a area of increased echogenicity within the superior aspect of the vagina near the cervix which may represent a second separate mass lesion. Correlation with the physical exam is recommended. MRI may be helpful for further delineation. Endometrium Obscured by the underlying heterogeneous mass. Right ovary Not visualized Left ovary Not visualized Other findings No definitive free fluid is seen. IMPRESSION: Heterogeneous mass in the fundus of the uterus as well as a mass in the region of the cervix and vagina. These are felt to represent underlying neoplasm. Direct visualization is recommended as well as possible MRI of the pelvis for further delineation. Electronically Signed   By: Inez Catalina M.D.   On: 08/25/2019  11:16   Vas [place] Lower Extremity Venous (dvt)  Result Date: 09/05/2019  Lower Venous Study Indications: Edema.  Risk Factors: DVT Rt LE DVT Surgery 08/15/2019: Inferior Venocavogram; Placement of a [place] IVC Filter Infrarenal. Mechanical Thrombectomy of the Right CFV, Right External iliac Vein and Right Common Eliac vein. Comparison Study: 08/15/2019 Performing Technologist: Almira Coaster RVS  Examination Guidelines: A complete evaluation includes B-mode imaging, spectral Doppler, color Doppler, and power Doppler as needed of all accessible portions of each vessel. Bilateral testing is considered an integral part of a complete examination. Limited examinations for reoccurring indications may be performed as noted.  +---------+---------------+---------+-----------+----------+--------------+  RIGHT     Compressibility Phasicity Spontaneity Properties Thrombus Aging  +---------+---------------+---------+-----------+----------+--------------+  CFV       Full            Yes       Yes                                    +---------+---------------+---------+-----------+----------+--------------+  SFJ       Full            Yes       Yes                                    +---------+---------------+---------+-----------+----------+--------------+  FV Prox   None            No        No                                     +---------+---------------+---------+-----------+----------+--------------+  FV Mid    None            No        No                                     +---------+---------------+---------+-----------+----------+--------------+  FV Distal None            No        No                                     +---------+---------------+---------+-----------+----------+--------------+  PFV       Full            Yes       Yes                                    +---------+---------------+---------+-----------+----------+--------------+  POP       None            No        No                                      +---------+---------------+---------+-----------+----------+--------------+  PTV       Full            Yes       Yes                                    +---------+---------------+---------+-----------+----------+--------------+  PERO      Full            Yes       Yes                                    +---------+---------------+---------+-----------+----------+--------------+  GSV       Full            Yes       Yes                                    +---------+---------------+---------+-----------+----------+--------------+   +----+---------------+---------+-----------+----------+--------------+  LEFT Compressibility Phasicity Spontaneity Properties Thrombus Aging  +----+---------------+---------+-----------+----------+--------------+  CFV  Full            Yes       Yes                                    +----+---------------+---------+-----------+----------+--------------+     Summary: Right: Findings consistent with acute deep vein thrombosis involving the right femoral vein, and right popliteal vein. Essentially Unchanged, the Right CFV appears to no evidence of DVT.  *See table(s) above for measurements and observations. Electronically signed by [name] MD on 09/05/2019 at 5:26:00 PM.    Final      Antimicrobials:   MACRODANTIN PER MAR   Subjective: Patient reports no respiratory problems No chest pain Poor historian with known memory deficits  Objective: Vitals:   09/09/19 0900 09/09/19 1000 09/09/19 1113 09/09/19 1231  BP: (!) 160/77 133/63  (!) 152/66  Pulse: 93 88  84  Resp: (!) 21 18  20   Temp:   98.3 F (36.8 C)   TempSrc:   Oral   SpO2: 96% 99%  98%  Weight:      Height:        Intake/Output Summary (Last 24 hours) at 09/09/2019 1434 Last data filed at 09/09/2019 0332 Gross per 24 hour  Intake --  Output 500 ml  Net -500 ml   Filed Weights   09/08/19 1637  Weight: 110.2 kg    Examination:  General exam: Appears calm and comfortable  Respiratory system:  Clear to auscultation. Respiratory effort normal. Cardiovascular system: S1 & S2 heard, RRR. No JVD, murmurs, rubs, gallops or clicks. No pedal edema. Gastrointestinal system: Abdomen is nondistended, soft and nontender. No organomegaly or masses felt. Normal bowel sounds heard. Central nervous system: Alert and oriented. No focal neurological deficits. Extremities: WWP, no edema, rle with erythema. Skin: see above, o/w No rashes, lesions or ulcers Psychiatry: Judgement and insight appear normal. Mood & affect appropriate. Memory deficit chronically    Data Reviewed: I have personally reviewed following labs and imaging studies  CBC: Recent Labs  Lab 09/08/19 1725 09/09/19 0440  WBC 12.0* 9.9  NEUTROABS 10.0*  --   HGB 9.8* 8.9*  HCT 33.6* 30.6*  MCV 96.8 95.9  PLT 282 [id number]   Basic Metabolic Panel: Recent Labs  Lab 09/08/19 1725 09/09/19 0440  NA 140 140  K 4.0 4.1  CL 102 106  CO2 28 27  GLUCOSE 65* 71  BUN 13 13  CREATININE 0.66 0.55  CALCIUM 8.9 8.4*   GFR: Estimated Creatinine Clearance: 68.5 mL/min (by C-G formula based on SCr of 0.55 mg/dL). Liver Function Tests: Recent Labs  Lab 09/08/19 1725 09/09/19 0440  AST 28 30  ALT 28 26  ALKPHOS 83 75  BILITOT 0.4 0.5  PROT 7.2 6.6  ALBUMIN 2.7* 2.6*   No results for input(s): LIPASE, AMYLASE in the last 168 hours. No results for input(s): AMMONIA in the last 168 hours. Coagulation Profile: Recent Labs  Lab 09/08/19 1725  INR 1.4*   Cardiac Enzymes: No results for input(s): CKTOTAL, CKMB, CKMBINDEX, TROPONINI in the last 168 hours. BNP (last 3 results) No results for input(s): PROBNP in the last 8760 hours. HbA1C: No results for input(s): HGBA1C in the last 72 hours. CBG: Recent Labs  Lab 09/08/19 2012 09/08/19 2120 09/08/19 2242 09/09/19 0841 09/09/19 1111  GLUCAP 81 101* 90 67* 105*   Lipid Profile: No results for input(s): CHOL, HDL, LDLCALC, TRIG, CHOLHDL, LDLDIRECT in the last 72  hours. Thyroid Function Tests: No results for input(s): TSH, T4TOTAL, FREET4, T3FREE, THYROIDAB in the last 72 hours. Anemia Panel: No results for input(s): VITAMINB12, FOLATE, FERRITIN, TIBC, IRON, RETICCTPCT in the last 72 hours. Sepsis Labs: No results for input(s): PROCALCITON, LATICACIDVEN in the last 168 hours.  Recent Results (from the past 240 hour(s))  SARS CORONAVIRUS 2 (TAT 6-24 HRS) Nasopharyngeal Nasopharyngeal Swab     Status: None   Collection Time: 09/08/19  7:47 PM   Specimen: Nasopharyngeal Swab  Result Value Ref Range Status   SARS Coronavirus 2 NEGATIVE NEGATIVE Final    Comment: (NOTE) SARS-CoV-2 target nucleic acids are NOT DETECTED. The SARS-CoV-2 RNA is generally detectable in upper and lower respiratory specimens during the acute phase of infection. Negative results do not preclude SARS-CoV-2 infection, do not rule out co-infections with other pathogens, and should not be used as the sole basis for treatment or other patient management decisions. Negative results must be combined with clinical observations, patient history, and epidemiological information. The expected result is Negative. Fact Sheet for Patients: SugarRoll.be Fact Sheet for Healthcare Providers: https://www.woods-mathews.com/ This test is not yet approved or cleared by the [place] FDA and  has been authorized for detection and/or diagnosis of SARS-CoV-2 by FDA under an Emergency Use Authorization (EUA). This EUA will remain  in effect (meaning this test can be used) for the duration of the COVID-19 declaration under Section 56 4(b)(1) of the Act, 21 U.S.C. section 360bbb-3(b)(1), unless the  authorization is terminated or revoked sooner. Performed at [facility], [place] [address], [place], [place] [address]          Radiology Studies: Ct Head Wo Contrast  Result Date: 09/08/2019 CLINICAL DATA:  [place] in bathroom recent CT with  positive PE EXAM: CT HEAD WITHOUT CONTRAST TECHNIQUE: Contiguous axial images were obtained from the base of the skull through the vertex without intravenous contrast. COMPARISON:  CT 09/08/2019, 04/13/2019 FINDINGS: Brain: No acute territorial infarction, hemorrhage or intracranial mass. Atrophy and moderate hypodensity in the white matter consistent with small vessel ischemic change. Stable ventricle size Vascular: No hyperdense vessels.  Carotid vascular calcification Skull: Normal. Negative for fracture or focal lesion. Sinuses/Orbits: Postsurgical changes of the maxillary and ethmoid sinuses with mild mucosal thickening. Other: Small left forehead soft tissue swelling IMPRESSION: 1. No CT evidence for acute intracranial abnormality. 2. Atrophy and small vessel ischemic changes of white matter. Electronically Signed   By: Donavan Foil M.D.   On: 09/08/2019 19:11   Ct Chest W Contrast  Result Date: 09/08/2019 CLINICAL DATA:  Uterine and cervical mass on exam. Gyn malignancy is suspected. Staging evaluation. History of right breast cancer. Dyspnea. EXAM: CT CHEST, ABDOMEN, AND PELVIS WITH CONTRAST TECHNIQUE: Multidetector CT imaging of the chest, abdomen and pelvis was performed following the standard protocol during bolus administration of intravenous contrast. CONTRAST:  141mL OMNIPAQUE IOHEXOL 300 MG/ML  SOLN COMPARISON:  07/14/2019 chest radiograph. FINDINGS: CT CHEST FINDINGS Cardiovascular: Normal heart size. No significant pericardial effusion/thickening. Atherosclerotic nonaneurysmal thoracic aorta. Dilated main pulmonary artery (3.5 cm diameter). There is acute bilateral pulmonary embolism including a large saddle embolus. RV/LV ratio 0.90. Mediastinum/Nodes: No discrete thyroid nodules. Unremarkable esophagus. Surgical clips in the right axilla. No pathologically enlarged axillary nodes. Right paratracheal adenopathy up to 2.0 cm short axis diameter (series 2/image 18). Enlarged 2.1 cm AP window  node (series 2/image 20). Left prevascular adenopathy up to 1.1 cm (series 2/image 17). Enlarged 1.1 cm left posterior mediastinal node along the descending aorta (series 2/image 26). Bilateral retrocrural adenopathy measuring up to 1.3 cm on the right (series 2/image 46). Lungs/Pleura: No pneumothorax. No pleural effusion. No acute consolidative airspace disease, lung masses or significant pulmonary nodules. Musculoskeletal: No aggressive appearing focal osseous lesions. Marked thoracic spondylosis. CT ABDOMEN PELVIS FINDINGS Hepatobiliary: Normal liver size. Subcentimeter posterior right liver hypodense lesion is too small to characterize (series 2/image 45). No additional liver lesions. Cholelithiasis. Scattered gas within the contracted fundal gallbladder. No definite gallbladder wall thickening or pericholecystic fluid. No biliary ductal dilatation. Pancreas: Normal, with no mass or duct dilation. Spleen: Normal size. No mass. Adrenals/Urinary Tract: No discrete adrenal nodules. Mild fullness of the central right renal collecting system without overt right hydronephrosis. No left hydronephrosis. Symmetric contrast nephrograms. Scattered subcentimeter hypodense renal cortical lesions in both kidneys are too small to characterize and require no follow-up. Small parapelvic renal cysts in the left kidney. Normal bladder. Stomach/Bowel: Normal non-distended stomach. Normal caliber small bowel with no small bowel wall thickening. Normal appendix. Oral contrast transits to the left colon. Marked colonic diverticulosis, most prominent in the sigmoid colon, with no definite large bowel wall thickening or significant pericolonic fat stranding. Vascular/Lymphatic: Atherosclerotic nonaneurysmal abdominal aorta. Patent portal, splenic, hepatic and renal veins. IVC filter in place within the infrarenal IVC. Right external iliac vein stent. Extensive retroperitoneal and bilateral pelvic lymphadenopathy involving aortocaval,  left para-aortic, bilateral common iliac, bilateral external iliac and right inguinal nodal chains. Representative enlarged 1.8 cm right  inguinal node (series 7/image 103), 2.0 cm right external iliac node (series 7/image 90), 2.1 cm right common iliac node (series 7/image 76), 2.0 cm posterior aortocaval node (series 7/image 64) and 1.6 cm left para-aortic node (series 7/image 62). Reproductive: Enlarged uterus. Infiltrative poorly marginated heterogeneously enhancing 7.0 x 4.3 x 5.0 cm midline pelvic mass centered in the region of the vaginal fornix with involvement of the uterine cervix and intimate association with the anterior rectal wall. Scattered gas in the uterine cavity. Apparent 7.5 cm anterior uterine body solid mass, nonspecific, potentially a fibroid. No adnexal masses. Other: No pneumoperitoneum, ascites or focal fluid collection. Musculoskeletal: No aggressive appearing focal osseous lesions. 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 Chronic appearing osteolytic change at L3-4. IMPRESSION: 1. Acute bilateral pulmonary embolism with large saddle embolus. Dilated main pulmonary artery suggesting pulmonary hypertension. Patient will be brought immediately to the emergency department at [facility] from the lobby of the radiology department per instructions of Dr. Denman George. 2. Infiltrative poorly marginated heterogeneously enhancing 7.0 x 4.3 x 5.0 cm midline pelvic mass centered in the region of the vaginal fornix with involvement of the uterine cervix and with intimate association with the anterior rectal wall, suspicious for malignancy of uncertain primary site. 3. Extensive lymphadenopathy throughout the mediastinum, retroperitoneum and bilateral pelvis, likely metastatic adenopathy. 4. Enlarged uterus with apparent 7.5 cm anterior uterine body solid mass, nonspecific, potentially large uterine fibroid. No adnexal masses. No ascites. 5. Cholelithiasis.  Nonspecific gas in the fundal gallbladder. Consider short-term follow-up CT abdomen to exclude emphysematous cholecystitis. No biliary ductal dilatation. 6. Marked colonic diverticulosis. 7.  Aortic Atherosclerosis (ICD10-I70.0). Critical Value/emergent results were called by telephone at the time of interpretation on 09/08/2019 at 3:20 pm to provider EMMA ROSSI, MD, who verbally acknowledged these results. Electronically Signed   By: Ilona Sorrel M.D.   On: 09/08/2019 15:46   Ct Abdomen Pelvis W Contrast  Result Date: 09/08/2019 CLINICAL DATA:  Uterine and cervical mass on exam. Gyn malignancy is suspected. Staging evaluation. History of right breast cancer. Dyspnea. EXAM: CT CHEST, ABDOMEN, AND PELVIS WITH CONTRAST TECHNIQUE: Multidetector CT imaging of the chest, abdomen and pelvis was performed following the standard protocol during bolus administration of intravenous contrast. CONTRAST:  134mL OMNIPAQUE IOHEXOL 300 MG/ML  SOLN COMPARISON:  07/14/2019 chest radiograph. FINDINGS: CT CHEST FINDINGS Cardiovascular: Normal heart size. No significant pericardial effusion/thickening. Atherosclerotic nonaneurysmal thoracic aorta. Dilated main pulmonary artery (3.5 cm diameter). There is acute bilateral pulmonary embolism including a large saddle embolus. RV/LV ratio 0.90. Mediastinum/Nodes: No discrete thyroid nodules. Unremarkable esophagus. Surgical clips in the right axilla. No pathologically enlarged axillary nodes. Right paratracheal adenopathy up to 2.0 cm short axis diameter (series 2/image 18). Enlarged 2.1 cm AP window node (series 2/image 20). Left prevascular adenopathy up to 1.1 cm (series 2/image 17). Enlarged 1.1 cm left posterior mediastinal node along the descending aorta (series 2/image 26). Bilateral retrocrural adenopathy measuring up to 1.3 cm on the right (series 2/image 46). Lungs/Pleura: No pneumothorax. No pleural effusion. No acute consolidative airspace disease, lung masses or significant  pulmonary nodules. Musculoskeletal: No aggressive appearing focal osseous lesions. Marked thoracic spondylosis. CT ABDOMEN PELVIS FINDINGS Hepatobiliary: Normal liver size. Subcentimeter posterior right liver hypodense lesion is too small to characterize (series 2/image 45). No additional liver lesions. Cholelithiasis. Scattered gas within the contracted fundal gallbladder. No definite gallbladder wall thickening or pericholecystic fluid. No biliary ductal dilatation. Pancreas: Normal, with no mass or duct dilation. Spleen:  Normal size. No mass. Adrenals/Urinary Tract: No discrete adrenal nodules. Mild fullness of the central right renal collecting system without overt right hydronephrosis. No left hydronephrosis. Symmetric contrast nephrograms. Scattered subcentimeter hypodense renal cortical lesions in both kidneys are too small to characterize and require no follow-up. Small parapelvic renal cysts in the left kidney. Normal bladder. Stomach/Bowel: Normal non-distended stomach. Normal caliber small bowel with no small bowel wall thickening. Normal appendix. Oral contrast transits to the left colon. Marked colonic diverticulosis, most prominent in the sigmoid colon, with no definite large bowel wall thickening or significant pericolonic fat stranding. Vascular/Lymphatic: Atherosclerotic nonaneurysmal abdominal aorta. Patent portal, splenic, hepatic and renal veins. IVC filter in place within the infrarenal IVC. Right external iliac vein stent. Extensive retroperitoneal and bilateral pelvic lymphadenopathy involving aortocaval, left para-aortic, bilateral common iliac, bilateral external iliac and right inguinal nodal chains. Representative enlarged 1.8 cm right inguinal node (series 7/image 103), 2.0 cm right external iliac node (series 7/image 90), 2.1 cm right common iliac node (series 7/image 76), 2.0 cm posterior aortocaval node (series 7/image 64) and 1.6 cm left para-aortic node (series 7/image 62).  Reproductive: Enlarged uterus. Infiltrative poorly marginated heterogeneously enhancing 7.0 x 4.3 x 5.0 cm midline pelvic mass centered in the region of the vaginal fornix with involvement of the uterine cervix and intimate association with the anterior rectal wall. Scattered gas in the uterine cavity. Apparent 7.5 cm anterior uterine body solid mass, nonspecific, potentially a fibroid. No adnexal masses. Other: No pneumoperitoneum, ascites or focal fluid collection. Musculoskeletal: No aggressive appearing focal osseous lesions. 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 Chronic appearing osteolytic change at L3-4. IMPRESSION: 1. Acute bilateral pulmonary embolism with large saddle embolus. Dilated main pulmonary artery suggesting pulmonary hypertension. Patient will be brought immediately to the emergency department at [facility] from the lobby of the radiology department per instructions of Dr. Denman George. 2. Infiltrative poorly marginated heterogeneously enhancing 7.0 x 4.3 x 5.0 cm midline pelvic mass centered in the region of the vaginal fornix with involvement of the uterine cervix and with intimate association with the anterior rectal wall, suspicious for malignancy of uncertain primary site. 3. Extensive lymphadenopathy throughout the mediastinum, retroperitoneum and bilateral pelvis, likely metastatic adenopathy. 4. Enlarged uterus with apparent 7.5 cm anterior uterine body solid mass, nonspecific, potentially large uterine fibroid. No adnexal masses. No ascites. 5. Cholelithiasis. Nonspecific gas in the fundal gallbladder. Consider short-term follow-up CT abdomen to exclude emphysematous cholecystitis. No biliary ductal dilatation. 6. Marked colonic diverticulosis. 7.  Aortic Atherosclerosis (ICD10-I70.0). Critical Value/emergent results were called by telephone at the time of interpretation on 09/08/2019 at 3:20 pm to provider EMMA ROSSI, MD, who verbally  acknowledged these results. Electronically Signed   By: Ilona Sorrel M.D.   On: 09/08/2019 15:46        Scheduled Meds:  acidophilus   Oral Daily   Chlorhexidine Gluconate Cloth  6 each Topical Daily   cholecalciferol  5,000 Units Oral Daily   ferrous sulfate  325 mg Oral Q breakfast   insulin aspart  0-5 Units Subcutaneous QHS   insulin aspart  0-9 Units Subcutaneous TID WC   lisinopril  20 mg Oral Daily   loratadine  10 mg Oral Daily   memantine  10 mg Oral BID   nitrofurantoin  100 mg Oral BID   rosuvastatin  10 mg Oral q1800   sertraline  100 mg Oral Daily   Continuous Infusions:  heparin 1,400 Units/hr (09/09/19 0602)  LOS: 1 day    Time spent: 35 min    Nicolette Bang, MD [facility]  If 7PM-7AM, please contact night-coverage  09/09/2019, 2:34 PM

## 2019-09-09 NOTE — Progress Notes (Signed)
ANTICOAGULATION CONSULT NOTE - Consult  Pharmacy Consult for IV heparin (on apixaban PTA) Indication: pulmonary embolus  Allergies  Allergen Reactions  . Other Shortness Of Breath, Swelling and Other (See Comments)    Cats, dogs, mold Induced asthma Cats, dogs, mold    Patient Measurements: Height: 5' (152.4 cm) Weight: 243 lb (110.2 kg) IBW/kg (Calculated) : 45.5 Heparin Dosing Weight: 72.8  Vital Signs: Temp: 98.1 F (36.7 C) (09/18 2000) Temp Source: Oral (09/18 2000) BP: 153/70 (09/18 2246) Pulse Rate: 90 (09/18 2246)  Labs: Recent Labs    09/08/19 1725 09/08/19 2019 09/08/19 2318 09/09/19 0330 09/09/19 0440 09/09/19 1339 09/09/19 2155  HGB 9.8*  --   --   --  8.9*  --   --   HCT 33.6*  --   --   --  30.6*  --   --   PLT 282  --   --   --  264  --   --   APTT 35  --   --   --  71* 68* 67*  LABPROT 17.3*  --   --   --   --   --   --   INR 1.4*  --   --   --   --   --   --   HEPARINUNFRC  --  >2.00*  --   --  1.64*  --   --   CREATININE 0.66  --   --   --  0.55  --   --   TROPONINIHS  --   --  8 8  --   --   --     Estimated Creatinine Clearance: 68.5 mL/min (by C-G formula based on SCr of 0.55 mg/dL).   Medical History: Past Medical History:  Diagnosis Date  . Allergic rhinitis, seasonal   . Arm bruise    left arm down to hand from fall 01-05-2019  . Bilateral renal cysts    simple (monitored by dr Karsten Ro)  . Chronic cystitis    urologist-- dr Karsten Ro  . GAD (generalized anxiety disorder)   . History of cancer chemotherapy 09-09-2016  to 11-12-2016   breast cancer  . History of external beam radiation therapy 12-24-2016  to 02-03-2017   right breast  . History of recurrent UTIs   . Humerus fracture 01/05/2019   left ,  hx total shoulder arthroplasty   . Hyperlipidemia   . Hypertension   . IBS (irritable bowel syndrome)    mixed  . Lymphedema of right upper extremity   . 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 Memory disorder 06/22/2017   with intermittant confusion;  neurologist-- dr Krista Blue (note in epic)  . Mild persistent asthma    no inhaler  . OA (osteoarthritis)   . OSA on CPAP   . Type 2 diabetes, diet controlled ([place])   . Urge incontinence of urine   . Wears glasses     Medications:  Scheduled:  . acidophilus   Oral Daily  . Chlorhexidine Gluconate Cloth  6 each Topical Daily  . cholecalciferol  5,000 Units Oral Daily  .  ferrous sulfate  325 mg Oral Q breakfast  . insulin aspart  0-5 Units Subcutaneous QHS  . insulin aspart  0-9 Units Subcutaneous TID WC  . lisinopril  20 mg Oral Daily  . loratadine  10 mg Oral Daily  . memantine  10 mg Oral BID  . nitrofurantoin  100 mg Oral BID  . rosuvastatin  10 mg Oral q1800  . sertraline  100 mg Oral Daily   Infusions:  . heparin      Assessment: 78 yo female on apixaban 12m BID prior to admission for hx DVT presents with PE to transition off of apixaban and on IV heparin. Patient took last apixaban this AM 9/17 at 0900; therefore, IV heparin will not be started until ~2100 when next dose of apixaban would have been due. All baseline labs obtained except for heparin level - will order STAT.  Today, 9/18 0449 aptt = 71 sec at low end of goal, HL 1.64 still elevated d/t apixaban H/H, plts stable 1339 aptt = 68 sec 2155 aPTT= 67 sec low end of goal, no bleeding or other issues per RN  Goal of Therapy:  Heparin level 0.3-0.7 units/ml  APTT 66-102 sec Monitor platelets by anticoagulation protocol: Yes   Plan:  Increase heparin drip to 1600 units/hr Recheck aptt in 8 hours (using aptt until correlates with HL d/t  recent DOAC) Daily CBC/HL  GDorrene German9/18/2020, 11:12 PM

## 2019-09-09 NOTE — Progress Notes (Signed)
Echocardiogram 2D Echocardiogram has been performed.  Oneal Deputy Haider Hornaday 09/09/2019, 12:26 PM

## 2019-09-09 NOTE — Progress Notes (Signed)
Tiffany Arnold   DOB:1941/09/11   [id number]   [id number]  Oncology follow up note   Subjective: Patient is well-known to me, under my care for her early stage breast cancer.  I last saw her in my office on July 20, 2019.  She unfortunately was diagnosed with endometrial cancer in early September 2020.  She was seen by GYN oncologist Dr. Skeet Latch lately.  She developed lower extremity DVT it is the end of August 2020, and was started on Eliquis by her PCP.  She has been having vaginal spotting from her endometrial cancer, and her vaginal bleeding has significantly increased in the past few weeks, probably related to anticoagulation.  She was admitted to yesterday for saddle PE.  I spoke with her sister Tiffany Arnold, who manages her medication, and she reports patient has been compliant with Eliquis.   Objective:  Vitals:   09/09/19 1600 09/09/19 1700  BP:    Pulse:  81  Resp:  17  Temp: 97.9 F (36.6 C)   SpO2:  98%    Body mass index is 47.46 kg/m.  Intake/Output Summary (Last 24 hours) at 09/09/2019 1812 Last data filed at 09/09/2019 0332 Gross per 24 hour  Intake -  Output 500 ml  Net -500 ml     Sclerae unicteric  Oropharynx clear  No peripheral adenopathy  Lungs clear -- no rales or rhonchi  Heart regular rate and rhythm  Abdomen benign, no blood on her pad   Neuro nonfocal    CBG (last 3)  Recent Labs    09/09/19 0841 09/09/19 1111 09/09/19 1649  GLUCAP 67* 105* 128*     Labs:  Urine Studies No results for input(s): UHGB, CRYS in the last 72 hours.  Invalid input(s): UACOL, UAPR, USPG, UPH, UTP, UGL, UKET, UBIL, UNIT, UROB, ULEU, UEPI, UWBC, URBC, UBAC, CAST, UCOM, BILUA  Basic Metabolic Panel: Recent Labs  Lab 09/08/19 1725 09/09/19 0440  NA 140 140  K 4.0 4.1  CL 102 106  CO2 28 27  GLUCOSE 65* 71  BUN 13 13  CREATININE 0.66 0.55  CALCIUM 8.9 8.4*   GFR Estimated Creatinine Clearance: 68.5 mL/min (by C-G formula based on SCr of 0.55  mg/dL). Liver Function Tests: Recent Labs  Lab 09/08/19 1725 09/09/19 0440  AST 28 30  ALT 28 26  ALKPHOS 83 75  BILITOT 0.4 0.5  PROT 7.2 6.6  ALBUMIN 2.7* 2.6*   No results for input(s): LIPASE, AMYLASE in the last 168 hours. No results for input(s): AMMONIA in the last 168 hours. Coagulation profile Recent Labs  Lab 09/08/19 1725  INR 1.4*    CBC: Recent Labs  Lab 09/08/19 1725 09/09/19 0440  WBC 12.0* 9.9  NEUTROABS 10.0*  --   HGB 9.8* 8.9*  HCT 33.6* 30.6*  MCV 96.8 95.9  PLT 282 264   Cardiac Enzymes: No results for input(s): CKTOTAL, CKMB, CKMBINDEX, TROPONINI in the last 168 hours. BNP: Invalid input(s): POCBNP CBG: Recent Labs  Lab 09/08/19 2120 09/08/19 2242 09/09/19 0841 09/09/19 1111 09/09/19 1649  GLUCAP 101* 90 67* 105* 128*   D-Dimer No results for input(s): DDIMER in the last 72 hours. Hgb A1c No results for input(s): HGBA1C in the last 72 hours. Lipid Profile No results for input(s): CHOL, HDL, LDLCALC, TRIG, CHOLHDL, LDLDIRECT in the last 72 hours. Thyroid function studies No results for input(s): TSH, T4TOTAL, T3FREE, THYROIDAB in the last 72 hours.  Invalid input(s): FREET3 Anemia work up No results for  input(s): VITAMINB12, FOLATE, FERRITIN, TIBC, IRON, RETICCTPCT in the last 72 hours. Microbiology Recent Results (from the past 240 hour(s))  SARS CORONAVIRUS 2 (TAT 6-24 HRS) Nasopharyngeal Nasopharyngeal Swab     Status: None   Collection Time: 09/08/19  7:47 PM   Specimen: Nasopharyngeal Swab  Result Value Ref Range Status   SARS Coronavirus 2 NEGATIVE NEGATIVE Final    Comment: (NOTE) SARS-CoV-2 target nucleic acids are NOT DETECTED. The SARS-CoV-2 RNA is generally detectable in upper and lower respiratory specimens during the acute phase of infection. Negative results do not preclude SARS-CoV-2 infection, do not rule out co-infections with other pathogens, and should not be used as the sole basis for treatment or other  patient management decisions. Negative results must be combined with clinical observations, patient history, and epidemiological information. The expected result is Negative. Fact Sheet for Patients: SugarRoll.be Fact Sheet for Healthcare Providers: https://www.woods-mathews.com/ This test is not yet approved or cleared by the [place] FDA and  has been authorized for detection and/or diagnosis of SARS-CoV-2 by FDA under an Emergency Use Authorization (EUA). This EUA will remain  in effect (meaning this test can be used) for the duration of the COVID-19 declaration under Section 56 4(b)(1) of the Act, 21 U.S.C. section 360bbb-3(b)(1), unless the authorization is terminated or revoked sooner. Performed at [facility], [place] [address] [place], [place] [address]   MRSA PCR Screening     Status: None   Collection Time: 09/09/19 12:55 PM   Specimen: Nasal Mucosa; Nasopharyngeal  Result Value Ref Range Status   MRSA by PCR NEGATIVE NEGATIVE Final    Comment:        The GeneXpert MRSA Assay (FDA approved for NASAL specimens only), is one component of a comprehensive MRSA colonization surveillance program. It is not intended to diagnose MRSA infection nor to guide or monitor treatment for MRSA infections. Performed at [facility], [place] [address] [place], [place] [address]       Studies:  Ct Head Wo Contrast  Result Date: 09/08/2019 CLINICAL DATA:  [place] in bathroom recent CT with positive PE EXAM: CT HEAD WITHOUT CONTRAST TECHNIQUE: Contiguous axial images were obtained from the base of the skull through the vertex without intravenous contrast. COMPARISON:  CT 09/08/2019, 04/13/2019 FINDINGS: Brain: No acute territorial infarction, hemorrhage or intracranial mass. Atrophy and moderate hypodensity in the white matter consistent with small vessel ischemic change. Stable ventricle size Vascular: No hyperdense  vessels.  Carotid vascular calcification Skull: Normal. Negative for fracture or focal lesion. Sinuses/Orbits: Postsurgical changes of the maxillary and ethmoid sinuses with mild mucosal thickening. Other: Small left forehead soft tissue swelling IMPRESSION: 1. No CT evidence for acute intracranial abnormality. 2. Atrophy and small vessel ischemic changes of white matter. Electronically Signed   By: Donavan Foil M.D.   On: 09/08/2019 19:11   Ct Chest W Contrast  Result Date: 09/08/2019 CLINICAL DATA:  Uterine and cervical mass on exam. Gyn malignancy is suspected. Staging evaluation. History of right breast cancer. Dyspnea. EXAM: CT CHEST, ABDOMEN, AND PELVIS WITH CONTRAST TECHNIQUE: Multidetector CT imaging of the chest, abdomen and pelvis was performed following the standard protocol during bolus administration of intravenous contrast. CONTRAST:  115mL OMNIPAQUE IOHEXOL 300 MG/ML  SOLN COMPARISON:  07/14/2019 chest radiograph. FINDINGS: CT CHEST FINDINGS Cardiovascular: Normal heart size. No significant pericardial effusion/thickening. Atherosclerotic nonaneurysmal thoracic aorta. Dilated main pulmonary artery (3.5 cm diameter). There is acute bilateral pulmonary embolism including a large saddle embolus. RV/LV ratio  0.90. Mediastinum/Nodes: No discrete thyroid nodules. Unremarkable esophagus. Surgical clips in the right axilla. No pathologically enlarged axillary nodes. Right paratracheal adenopathy up to 2.0 cm short axis diameter (series 2/image 18). Enlarged 2.1 cm AP window node (series 2/image 20). Left prevascular adenopathy up to 1.1 cm (series 2/image 17). Enlarged 1.1 cm left posterior mediastinal node along the descending aorta (series 2/image 26). Bilateral retrocrural adenopathy measuring up to 1.3 cm on the right (series 2/image 46). Lungs/Pleura: No pneumothorax. No pleural effusion. No acute consolidative airspace disease, lung masses or significant pulmonary nodules. Musculoskeletal: No  aggressive appearing focal osseous lesions. Marked thoracic spondylosis. CT ABDOMEN PELVIS FINDINGS Hepatobiliary: Normal liver size. Subcentimeter posterior right liver hypodense lesion is too small to characterize (series 2/image 45). No additional liver lesions. Cholelithiasis. Scattered gas within the contracted fundal gallbladder. No definite gallbladder wall thickening or pericholecystic fluid. No biliary ductal dilatation. Pancreas: Normal, with no mass or duct dilation. Spleen: Normal size. No mass. Adrenals/Urinary Tract: No discrete adrenal nodules. Mild fullness of the central right renal collecting system without overt right hydronephrosis. No left hydronephrosis. Symmetric contrast nephrograms. Scattered subcentimeter hypodense renal cortical lesions in both kidneys are too small to characterize and require no follow-up. Small parapelvic renal cysts in the left kidney. Normal bladder. Stomach/Bowel: Normal non-distended stomach. Normal caliber small bowel with no small bowel wall thickening. Normal appendix. Oral contrast transits to the left colon. Marked colonic diverticulosis, most prominent in the sigmoid colon, with no definite large bowel wall thickening or significant pericolonic fat stranding. Vascular/Lymphatic: Atherosclerotic nonaneurysmal abdominal aorta. Patent portal, splenic, hepatic and renal veins. IVC filter in place within the infrarenal IVC. Right external iliac vein stent. Extensive retroperitoneal and bilateral pelvic lymphadenopathy involving aortocaval, left para-aortic, bilateral common iliac, bilateral external iliac and right inguinal nodal chains. Representative enlarged 1.8 cm right inguinal node (series 7/image 103), 2.0 cm right external iliac node (series 7/image 90), 2.1 cm right common iliac node (series 7/image 76), 2.0 cm posterior aortocaval node (series 7/image 64) and 1.6 cm left para-aortic node (series 7/image 62). Reproductive: Enlarged uterus. Infiltrative  poorly marginated heterogeneously enhancing 7.0 x 4.3 x 5.0 cm midline pelvic mass centered in the region of the vaginal fornix with involvement of the uterine cervix and intimate association with the anterior rectal wall. Scattered gas in the uterine cavity. Apparent 7.5 cm anterior uterine body solid mass, nonspecific, potentially a fibroid. No adnexal masses. Other: No pneumoperitoneum, ascites or focal fluid collection. Musculoskeletal: No aggressive appearing focal osseous lesions. 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 Chronic appearing osteolytic change at L3-4. IMPRESSION: 1. Acute bilateral pulmonary embolism with large saddle embolus. Dilated main pulmonary artery suggesting pulmonary hypertension. Patient will be brought immediately to the emergency department at [facility] from the lobby of the radiology department per instructions of Dr. Denman George. 2. Infiltrative poorly marginated heterogeneously enhancing 7.0 x 4.3 x 5.0 cm midline pelvic mass centered in the region of the vaginal fornix with involvement of the uterine cervix and with intimate association with the anterior rectal wall, suspicious for malignancy of uncertain primary site. 3. Extensive lymphadenopathy throughout the mediastinum, retroperitoneum and bilateral pelvis, likely metastatic adenopathy. 4. Enlarged uterus with apparent 7.5 cm anterior uterine body solid mass, nonspecific, potentially large uterine fibroid. No adnexal masses. No ascites. 5. Cholelithiasis. Nonspecific gas in the fundal gallbladder. Consider short-term follow-up CT abdomen to exclude emphysematous cholecystitis. No biliary ductal dilatation. 6. Marked colonic diverticulosis. 7.  Aortic Atherosclerosis (ICD10-I70.0). Critical  Value/emergent results were called by telephone at the time of interpretation on 09/08/2019 at 3:20 pm to provider EMMA ROSSI, MD, who verbally acknowledged these results. Electronically Signed    By: Ilona Sorrel M.D.   On: 09/08/2019 15:46   Ct Abdomen Pelvis W Contrast  Result Date: 09/08/2019 CLINICAL DATA:  Uterine and cervical mass on exam. Gyn malignancy is suspected. Staging evaluation. History of right breast cancer. Dyspnea. EXAM: CT CHEST, ABDOMEN, AND PELVIS WITH CONTRAST TECHNIQUE: Multidetector CT imaging of the chest, abdomen and pelvis was performed following the standard protocol during bolus administration of intravenous contrast. CONTRAST:  122mL OMNIPAQUE IOHEXOL 300 MG/ML  SOLN COMPARISON:  07/14/2019 chest radiograph. FINDINGS: CT CHEST FINDINGS Cardiovascular: Normal heart size. No significant pericardial effusion/thickening. Atherosclerotic nonaneurysmal thoracic aorta. Dilated main pulmonary artery (3.5 cm diameter). There is acute bilateral pulmonary embolism including a large saddle embolus. RV/LV ratio 0.90. Mediastinum/Nodes: No discrete thyroid nodules. Unremarkable esophagus. Surgical clips in the right axilla. No pathologically enlarged axillary nodes. Right paratracheal adenopathy up to 2.0 cm short axis diameter (series 2/image 18). Enlarged 2.1 cm AP window node (series 2/image 20). Left prevascular adenopathy up to 1.1 cm (series 2/image 17). Enlarged 1.1 cm left posterior mediastinal node along the descending aorta (series 2/image 26). Bilateral retrocrural adenopathy measuring up to 1.3 cm on the right (series 2/image 46). Lungs/Pleura: No pneumothorax. No pleural effusion. No acute consolidative airspace disease, lung masses or significant pulmonary nodules. Musculoskeletal: No aggressive appearing focal osseous lesions. Marked thoracic spondylosis. CT ABDOMEN PELVIS FINDINGS Hepatobiliary: Normal liver size. Subcentimeter posterior right liver hypodense lesion is too small to characterize (series 2/image 45). No additional liver lesions. Cholelithiasis. Scattered gas within the contracted fundal gallbladder. No definite gallbladder wall thickening or pericholecystic  fluid. No biliary ductal dilatation. Pancreas: Normal, with no mass or duct dilation. Spleen: Normal size. No mass. Adrenals/Urinary Tract: No discrete adrenal nodules. Mild fullness of the central right renal collecting system without overt right hydronephrosis. No left hydronephrosis. Symmetric contrast nephrograms. Scattered subcentimeter hypodense renal cortical lesions in both kidneys are too small to characterize and require no follow-up. Small parapelvic renal cysts in the left kidney. Normal bladder. Stomach/Bowel: Normal non-distended stomach. Normal caliber small bowel with no small bowel wall thickening. Normal appendix. Oral contrast transits to the left colon. Marked colonic diverticulosis, most prominent in the sigmoid colon, with no definite large bowel wall thickening or significant pericolonic fat stranding. Vascular/Lymphatic: Atherosclerotic nonaneurysmal abdominal aorta. Patent portal, splenic, hepatic and renal veins. IVC filter in place within the infrarenal IVC. Right external iliac vein stent. Extensive retroperitoneal and bilateral pelvic lymphadenopathy involving aortocaval, left para-aortic, bilateral common iliac, bilateral external iliac and right inguinal nodal chains. Representative enlarged 1.8 cm right inguinal node (series 7/image 103), 2.0 cm right external iliac node (series 7/image 90), 2.1 cm right common iliac node (series 7/image 76), 2.0 cm posterior aortocaval node (series 7/image 64) and 1.6 cm left para-aortic node (series 7/image 62). Reproductive: Enlarged uterus. Infiltrative poorly marginated heterogeneously enhancing 7.0 x 4.3 x 5.0 cm midline pelvic mass centered in the region of the vaginal fornix with involvement of the uterine cervix and intimate association with the anterior rectal wall. Scattered gas in the uterine cavity. Apparent 7.5 cm anterior uterine body solid mass, nonspecific, potentially a fibroid. No adnexal masses. Other: No pneumoperitoneum, ascites  or focal fluid collection. Musculoskeletal: No aggressive appearing focal osseous lesions. 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 Chronic appearing osteolytic change at L3-4.  IMPRESSION: 1. Acute bilateral pulmonary embolism with large saddle embolus. Dilated main pulmonary artery suggesting pulmonary hypertension. Patient will be brought immediately to the emergency department at [facility] from the lobby of the radiology department per instructions of Dr. Denman George. 2. Infiltrative poorly marginated heterogeneously enhancing 7.0 x 4.3 x 5.0 cm midline pelvic mass centered in the region of the vaginal fornix with involvement of the uterine cervix and with intimate association with the anterior rectal wall, suspicious for malignancy of uncertain primary site. 3. Extensive lymphadenopathy throughout the mediastinum, retroperitoneum and bilateral pelvis, likely metastatic adenopathy. 4. Enlarged uterus with apparent 7.5 cm anterior uterine body solid mass, nonspecific, potentially large uterine fibroid. No adnexal masses. No ascites. 5. Cholelithiasis. Nonspecific gas in the fundal gallbladder. Consider short-term follow-up CT abdomen to exclude emphysematous cholecystitis. No biliary ductal dilatation. 6. Marked colonic diverticulosis. 7.  Aortic Atherosclerosis (ICD10-I70.0). Critical Value/emergent results were called by telephone at the time of interpretation on 09/08/2019 at 3:20 pm to provider EMMA ROSSI, MD, who verbally acknowledged these results. Electronically Signed   By: Ilona Sorrel M.D.   On: 09/08/2019 15:46    Assessment: 78 y.o. female   1. saddle PE, recently diagnosed R LE DVT on Eliquist  2. Newly diagnosed endometrial cancer with nodal metastasis 3.  Anemia from vaginal bleeding, and underlying malignancy 4.  History of stage II right breast cancer, on adjuvant Exemestane  5.  Memory loss and cognitive dysfunction 6. Obesity  7. OSA 8. HTN     Plan:  -continue heparin drip while she is in hospital. I recommend switching to lovenox injection on discharge -will check iron study tomorrow morning, if low, please give iv iron  -I I reviewed his staging CT chest, abdomen pelvis findings with patient and his niece, which unfortunately showed extensive mediastinal, abdominal and retroperitoneal adenopathy, consistent with metastatic disease from endometrial cancer, less likely from breast cancer.  Given typical image findings, but no diagnosed saddle PE, medical comorbidities, I do not feel biopsy to confirm metastasis. -consider blood transfusion if Hg<8.0  -She has seen Dr. Skeet Latch who recommended chemo and radiation. I will ask my partner Dr. Alvy Bimler who is specialized in GYN cancer treatment, to treat her metastatic endometrial cancer, she has been referred to Dr. Sondra Come for radiation therapy also. If she has significant vaginal bleeding, she may need urgent radiation for bleeding control.  -I spoke with pt's sister on the phone and Niece at bedside. Pt lives in [place], but would like to get treatment here.    Truitt Merle, MD 09/09/2019  6:12 PM

## 2019-09-09 NOTE — Progress Notes (Signed)
ANTICOAGULATION CONSULT NOTE - Consult  Pharmacy Consult for IV heparin (on apixaban PTA) Indication: pulmonary embolus  Allergies  Allergen Reactions  . Other Shortness Of Breath, Swelling and Other (See Comments)    Cats, dogs, mold Induced asthma Cats, dogs, mold    Patient Measurements: Height: 5' (152.4 cm) Weight: 243 lb (110.2 kg) IBW/kg (Calculated) : 45.5 Heparin Dosing Weight: 72.8  Vital Signs: Temp: 98.5 F (36.9 C) (09/18 0430) BP: 155/74 (09/18 0500) Pulse Rate: 90 (09/18 0500)  Labs: Recent Labs    09/08/19 1725 09/08/19 2019 09/08/19 2318 09/09/19 0330 09/09/19 0440  HGB 9.8*  --   --   --  8.9*  HCT 33.6*  --   --   --  30.6*  PLT 282  --   --   --  264  APTT 35  --   --   --  71*  LABPROT 17.3*  --   --   --   --   INR 1.4*  --   --   --   --   HEPARINUNFRC  --  >2.00*  --   --   --   CREATININE 0.66  --   --   --  0.55  TROPONINIHS  --   --  8 8  --     Estimated Creatinine Clearance: 68.5 mL/min (by C-G formula based on SCr of 0.55 mg/dL).   Medical History: Past Medical History:  Diagnosis Date  . Allergic rhinitis, seasonal   . Arm bruise    left arm down to hand from fall 01-05-2019  . Bilateral renal cysts    simple (monitored by dr Karsten Ro)  . Chronic cystitis    urologist-- dr Karsten Ro  . GAD (generalized anxiety disorder)   . History of cancer chemotherapy 09-09-2016  to 11-12-2016   breast cancer  . History of external beam radiation therapy 12-24-2016  to 02-03-2017   right breast  . History of recurrent UTIs   . Humerus fracture 01/05/2019   left ,  hx total shoulder arthroplasty   . Hyperlipidemia   . Hypertension   . IBS (irritable bowel syndrome)    mixed  . Lymphedema of right upper extremity   . 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 Memory disorder 06/22/2017   with intermittant confusion;  neurologist-- dr Krista Blue (note in epic)  . Mild persistent asthma    no inhaler  . OA (osteoarthritis)   . OSA on CPAP   . Type 2 diabetes, diet controlled ([facility])   . Urge incontinence of urine   . Wears glasses     Medications:  Scheduled:  . acidophilus   Oral Daily  . cholecalciferol  5,000 Units Oral Daily  . ferrous sulfate  325 mg Oral Q breakfast  . insulin aspart  0-5 Units Subcutaneous QHS  . insulin aspart  0-9 Units Subcutaneous TID WC  . lisinopril  20 mg Oral Daily  . loratadine  10 mg Oral Daily  . memantine  10 mg Oral BID  . nitrofurantoin  100 mg Oral BID  . rosuvastatin  10 mg Oral q1800  .  sertraline  100 mg Oral Daily   Infusions:  . sodium chloride 75 mL/hr at 09/09/19 0054  . heparin      Assessment: 78 yo female on apixaban 41m BID prior to admission for hx DVT presents with PE to transition off of apixaban and on IV heparin. Patient took last apixaban this AM 9/17 at 0900; therefore, IV heparin will not be started until ~2100 when next dose of apixaban would have been due. All baseline labs obtained except for heparin level - will order STAT.  Today, 9/18 0449 aptt = 71 sec at low end of goal, HL still pending but likely still elevated d/t apixaban H/H, plts stable  Goal of Therapy:  Heparin level 0.3-0.7 units/ml  APTT 66-102 sec Monitor platelets by anticoagulation protocol: Yes   Plan:   Increase heparin drip to 1400 units/hr Recheck aptt in 8 hours (using aptt until correlates with HL d/t recent DOAC) Daily CBC/HL  GLawana PaiR 09/09/2019,5:20 AM

## 2019-09-09 NOTE — ED Notes (Signed)
RN made aware of CBG of 67. Pt is asymptomatic and reports no sx of hypoglycemia. Pt given cup of orange juice and breakfast tray at this time.

## 2019-09-09 NOTE — Progress Notes (Addendum)
Patient seen in the ED. Resting comfortably in the bed finishing breakfast.  States she feels the shortness of breath is improving. She is waiting for a room to open in the hospital. No concerns voiced. Stating her vaginal bleeding has decreased.  Discussed with her the possibility that the bleeding could worsen given the fact she is on a heparin drip currently. Advised her to notify her nurse if this occurs.  If bleeding becomes significant, emergent radiation may be considered. No concerns voiced. Outpatient referrals are being placed for Medical Oncology.  Dr. Leone Brand treatment recommendations include chemotherapy with 3 cycles then re-evaluation. If there is no tumor regression, consideration of radiation at that time.

## 2019-09-09 NOTE — Progress Notes (Signed)
Consult ordered for medical oncology.  She will be seen when she is discharged by Dr. Alvy Bimler.  ER/PR and MSI testing requesated on Accession: [id number] with [facility].

## 2019-09-09 NOTE — Progress Notes (Signed)
ANTICOAGULATION CONSULT NOTE - Consult  Pharmacy Consult for IV heparin (on apixaban PTA) Indication: pulmonary embolus  Allergies  Allergen Reactions  . Other Shortness Of Breath, Swelling and Other (See Comments)    Cats, dogs, mold Induced asthma Cats, dogs, mold    Patient Measurements: Height: 5' (152.4 cm) Weight: 243 lb (110.2 kg) IBW/kg (Calculated) : 45.5 Heparin Dosing Weight: 72.8  Vital Signs: Temp: 98.3 F (36.8 C) (09/18 1113) Temp Source: Oral (09/18 1113) BP: 152/66 (09/18 1231) Pulse Rate: 84 (09/18 1231)  Labs: Recent Labs    09/08/19 1725 09/08/19 2019 09/08/19 2318 09/09/19 0330 09/09/19 0440 09/09/19 1339  HGB 9.8*  --   --   --  8.9*  --   HCT 33.6*  --   --   --  30.6*  --   PLT 282  --   --   --  264  --   APTT 35  --   --   --  71* 68*  LABPROT 17.3*  --   --   --   --   --   INR 1.4*  --   --   --   --   --   HEPARINUNFRC  --  >2.00*  --   --  1.64*  --   CREATININE 0.66  --   --   --  0.55  --   TROPONINIHS  --   --  8 8  --   --     Estimated Creatinine Clearance: 68.5 mL/min (by C-G formula based on SCr of 0.55 mg/dL).   Medical History: Past Medical History:  Diagnosis Date  . Allergic rhinitis, seasonal   . Arm bruise    left arm down to hand from fall 01-05-2019  . Bilateral renal cysts    simple (monitored by dr Karsten Ro)  . Chronic cystitis    urologist-- dr Karsten Ro  . GAD (generalized anxiety disorder)   . History of cancer chemotherapy 09-09-2016  to 11-12-2016   breast cancer  . History of external beam radiation therapy 12-24-2016  to 02-03-2017   right breast  . History of recurrent UTIs   . Humerus fracture 01/05/2019   left ,  hx total shoulder arthroplasty   . Hyperlipidemia   . Hypertension   . IBS (irritable bowel syndrome)    mixed  . Lymphedema of right upper extremity   . 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 Memory disorder 06/22/2017   with intermittant confusion;  neurologist-- dr Krista Blue (note in epic)  . Mild persistent asthma    no inhaler  . OA (osteoarthritis)   . OSA on CPAP   . Type 2 diabetes, diet controlled ([place])   . Urge incontinence of urine   . Wears glasses     Medications:  Scheduled:  . acidophilus   Oral Daily  . Chlorhexidine Gluconate Cloth  6 each Topical Daily  . cholecalciferol  5,000 Units Oral Daily  . ferrous sulfate  325 mg Oral Q breakfast  . insulin aspart  0-5 Units Subcutaneous QHS  . insulin aspart  0-9 Units Subcutaneous TID WC  .  lisinopril  20 mg Oral Daily  . loratadine  10 mg Oral Daily  . memantine  10 mg Oral BID  . nitrofurantoin  100 mg Oral BID  . rosuvastatin  10 mg Oral q1800  . sertraline  100 mg Oral Daily   Infusions:  . heparin 1,400 Units/hr (09/09/19 0602)    Assessment: 78 yo female on apixaban 36m BID prior to admission for hx DVT presents with PE to transition off of apixaban and on IV heparin. Patient took last apixaban this AM 9/17 at 0900; therefore, IV heparin will not be started until ~2100 when next dose of apixaban would have been due. All baseline labs obtained except for heparin level - will order STAT.  Today, 9/18 0449 aptt = 71 sec at low end of goal, HL 1.64 still elevated d/t apixaban H/H, plts stable 1339 aPTT= 68 sec low end of goal, no bleeding or other issues per RN  Goal of Therapy:  Heparin level 0.3-0.7 units/ml  APTT 66-102 sec Monitor platelets by anticoagulation protocol: Yes   Plan:  Increase heparin drip to 1500 units/hr Recheck aptt in 8 hours (using aptt until correlates with HL d/t recent DOAC) Daily CBC/HL  EDolly Rias RPh 09/09/2019, 2:21 PM Pager [phone]

## 2019-09-09 NOTE — ED Notes (Signed)
Rpt called to [employer]

## 2019-09-09 NOTE — ED Notes (Signed)
Pt provided 8 oz orange juice and breakfast tray

## 2019-09-10 LAB — CBC
HCT: 33.1 % — ABNORMAL LOW (ref 36.0–46.0)
Hemoglobin: 9.7 g/dL — ABNORMAL LOW (ref 12.0–15.0)
MCH: 28 pg (ref 26.0–34.0)
MCHC: 29.3 g/dL — ABNORMAL LOW (ref 30.0–36.0)
MCV: 95.7 fL (ref 80.0–100.0)
Platelets: 295 10*3/uL (ref 150–400)
RBC: 3.46 MIL/uL — ABNORMAL LOW (ref 3.87–5.11)
RDW: 15 % (ref 11.5–15.5)
WBC: 10.9 10*3/uL — ABNORMAL HIGH (ref 4.0–10.5)
nRBC: 0 % (ref 0.0–0.2)

## 2019-09-10 LAB — IRON AND TIBC
Iron: 18 ug/dL — ABNORMAL LOW (ref 28–170)
Saturation Ratios: 7 % — ABNORMAL LOW (ref 10.4–31.8)
TIBC: 250 ug/dL (ref 250–450)
UIBC: 232 ug/dL

## 2019-09-10 LAB — FERRITIN: Ferritin: 144 ng/mL (ref 11–307)

## 2019-09-10 LAB — BASIC METABOLIC PANEL
Anion gap: 10 (ref 5–15)
BUN: 13 mg/dL (ref 8–23)
CO2: 28 mmol/L (ref 22–32)
Calcium: 8.9 mg/dL (ref 8.9–10.3)
Chloride: 101 mmol/L (ref 98–111)
Creatinine, Ser: 0.65 mg/dL (ref 0.44–1.00)
GFR calc Af Amer: >60 mL/min (ref >60)
GFR calc non Af Amer: >60 mL/min (ref >60)
Glucose, Bld: 106 mg/dL — ABNORMAL HIGH (ref 70–99)
Potassium: 4.4 mmol/L (ref 3.5–5.1)
Sodium: 139 mmol/L (ref 135–145)

## 2019-09-10 LAB — GLUCOSE, CAPILLARY
Glucose-Capillary: 103 mg/dL — ABNORMAL HIGH (ref 70–99)
Glucose-Capillary: 172 mg/dL — ABNORMAL HIGH (ref 70–99)
Glucose-Capillary: 134 mg/dL — ABNORMAL HIGH (ref 70–99)
Glucose-Capillary: 130 mg/dL — ABNORMAL HIGH (ref 70–99)

## 2019-09-10 LAB — HEPARIN LEVEL (UNFRACTIONATED): Heparin Unfractionated: 0.74 IU/mL — ABNORMAL HIGH (ref 0.30–0.70)

## 2019-09-10 LAB — APTT: aPTT: 88 seconds — ABNORMAL HIGH (ref 24–36)

## 2019-09-10 MED ORDER — ORAL CARE MOUTH RINSE
15.0000 mL | Freq: Two times a day (BID) | OROMUCOSAL | Status: DC
  Administered 2019-09-10 – 2019-09-12 (×5): 15 mL via OROMUCOSAL

## 2019-09-10 NOTE — Progress Notes (Signed)
ANTICOAGULATION CONSULT NOTE - Follow Up Consult  Pharmacy Consult for heparin Indication: hx RLE DVT (08/17/19) and acute pulmonary embolus  Allergies  Allergen Reactions  . Other Shortness Of Breath, Swelling and Other (See Comments)    Cats, dogs, mold Induced asthma Cats, dogs, mold    Patient Measurements: Height: 5' (152.4 cm) Weight: 242 lb 8.1 oz (110 kg) IBW/kg (Calculated) : 45.5 Heparin Dosing Weight: 73 kg  Vital Signs: Temp: 98 F (36.7 C) (09/19 0800) Temp Source: Oral (09/19 0800) BP: 154/83 (09/19 0800) Pulse Rate: 90 (09/19 0800)  Labs: Recent Labs    09/08/19 1725 09/08/19 2019 09/08/19 2318 09/09/19 0330 09/09/19 0440 09/09/19 1339 09/09/19 2155 09/10/19 0854 09/10/19 0855  HGB 9.8*  --   --   --  8.9*  --   --  9.7*  --   HCT 33.6*  --   --   --  30.6*  --   --  33.1*  --   PLT 282  --   --   --  264  --   --  295  --   APTT 35  --   --   --  71* 68* 67* 88*  --   LABPROT 17.3*  --   --   --   --   --   --   --   --   INR 1.4*  --   --   --   --   --   --   --   --   HEPARINUNFRC  --  >2.00*  --   --  1.64*  --   --   --  0.74*  CREATININE 0.66  --   --   --  0.55  --   --  0.65  --   TROPONINIHS  --   --  8 8  --   --   --   --   --     Estimated Creatinine Clearance: 68.4 mL/min (by C-G formula based on SCr of 0.65 mg/dL).   Medications:  - on Eliquis 5 mg bid PTA (last dose taken on 09/08/19)  Assessment: Patient's a 78 y.o F recently diagnosed with endometrial cancer with nodal metastasis and R LE DVT (on 08/17/19) on Eliquis 5 mg PTA, admitted to [facility] on 9/17 after an outpatient chest CTA showed acute bilateral PE with large saddle embolus.  Patient endorsed being compliant to Eliquis.  She also reported having vaginal bleeding from endometrial cancer with increased bleeding in the past few weeks. Heparin drip started on admission for acute PE.  Dr. Burr Medico recommends to transition to [place] at discharge.  Today, 09/10/2019: - heparin level is  slightly elevated at 0.74 but this is still likely from residual effect of Eliquis. aPTT is therapeutic at 88. Will adjust heparin drip based on aPPT for now until heparin level and aPTT correlate, then monitor based on heparin llevel - cbc relatively stable - per pt, vaginal bleeding has improved  Goal of Therapy:  Heparin level 0.3-0.7 units/ml aPTT 66-102 seconds Monitor platelets by anticoagulation protocol: Yes   Plan:  - continue heparin drip at 1600 units/hr - check aPTT and heparin level in AM on 9/20 - monitor for severity of vaginal bleeding  Aayra Hornbaker P 09/10/2019,10:32 AM

## 2019-09-10 NOTE — Progress Notes (Signed)
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 New pelvic mass, apparently had a CT scan to w/up her new pelvic mass and pulmonary embolism was seen. Pt sent to ED for evaluation   Assessment & Plan:   Principal Problem:   Pulmonary embolism ([place]) Active Problems:   Essential hypertension   HLD (hyperlipidemia)   Type 2 diabetes mellitus without complication (HCC)   OSA on CPAP   Memory disorder   PE Tele F/up cardiac echo acute vs chronic pulm htn, clinically stable Failed Eliquis Heparin GTT Discussed with HemOnc Dr Burr Medico who who saw in consultation Discussed with IR and reviewed CT with them, it was thought given pt significant co-morbidities and hemodynamic stability, advised against intervention 2/2 risk vs benefit evalaution  New Pelvic Mass Oncology as above Appreciate consult  Dm2, w hypoglycemia Continue to hold Glipizide C/w ADA diet fsbs ac and qhs, ISS  Hypertension Cont Lisinopril 20mg  po qday Monitor BP  Memory disorder Cont Namenda 10mg  po bid  Anxiety Cont Zoloft 100mg  po qday   DVT prophylaxis: On:hep gtt  Code Status: FULL    Code Status Orders  (From admission, onward)         Start     Ordered   09/08/19 1940  Full code  Continuous     09/08/19 1941        Code Status History    Date Active Date Inactive Code Status Order ID Comments User Context   08/15/2019 2254 08/17/2019 1633 Full Code [id number]  Tiffany Mango, MD Inpatient   01/27/2019 2013 01/29/2019 1802 Full Code [id number]   Tiffany Arnold Inpatient   07/06/2017 1700 07/09/2017 2127 DNR [id number]  Tiffany Hire, MD Inpatient   04/27/2017 1745 04/30/2017 1714 Full Code [id number]  Tiffany Griffins, MD Inpatient   07/29/2016 1949 07/30/2016 1433 Full Code [id number]  Tiffany Skates, MD Inpatient   Advance Care Planning Activity     Family Communication: FAMILY TODAY Disposition Plan:   Patient remained inpatient, for continued heparin drip, conversion to Lovenox, continued management and observation with subspecialty expert opinion Consults called: None Admission status: Inpatient   Consultants:   MED ONC, GYN ONC,   Procedures:  Ct Head Wo Contrast  Result Date: 09/08/2019 CLINICAL DATA:  [place] in bathroom recent CT with positive PE EXAM: CT HEAD WITHOUT CONTRAST TECHNIQUE: Contiguous axial images were obtained from the base of the skull through the vertex without intravenous contrast. COMPARISON:  CT 09/08/2019, 04/13/2019 FINDINGS: Brain: No acute territorial infarction, hemorrhage or intracranial mass. Atrophy and moderate hypodensity in the white matter consistent with small vessel ischemic change. Stable ventricle size Vascular: No hyperdense vessels.  Carotid vascular calcification Skull: Normal. Negative for fracture or focal lesion. Sinuses/Orbits: Postsurgical changes of the maxillary and ethmoid sinuses with mild mucosal thickening. Other: Small left forehead soft tissue swelling IMPRESSION: 1. No CT evidence for acute intracranial abnormality. 2. Atrophy and small vessel ischemic changes of white matter. Electronically Signed   By: Donavan Foil M.D.   On: 09/08/2019 19:11   Ct Chest W Contrast  Result Date: 09/08/2019 CLINICAL DATA:  Uterine and cervical mass on exam. Gyn malignancy is suspected. Staging evaluation. History of right breast cancer. Dyspnea. EXAM: CT CHEST, ABDOMEN, AND PELVIS WITH CONTRAST TECHNIQUE: Multidetector CT imaging of the chest, abdomen and pelvis was performed following the  standard protocol during bolus administration of intravenous contrast. CONTRAST:  174mL OMNIPAQUE IOHEXOL 300 MG/ML  SOLN COMPARISON:  07/14/2019 chest radiograph. FINDINGS: CT CHEST FINDINGS Cardiovascular: Normal heart size. No significant pericardial effusion/thickening. Atherosclerotic nonaneurysmal thoracic aorta. Dilated main pulmonary artery (3.5 cm diameter). There is acute bilateral pulmonary embolism including a large saddle embolus. RV/LV ratio 0.90. Mediastinum/Nodes: No discrete thyroid nodules. Unremarkable esophagus. Surgical clips in the right axilla. No pathologically enlarged axillary nodes. Right paratracheal adenopathy up to 2.0 cm short axis diameter (series 2/image 18). Enlarged 2.1 cm AP window node (series 2/image 20). Left prevascular adenopathy up to 1.1 cm (series 2/image 17). Enlarged 1.1 cm left posterior mediastinal node along the descending aorta (series 2/image 26). Bilateral retrocrural adenopathy measuring up to 1.3 cm on the right (series 2/image 46). Lungs/Pleura: No pneumothorax. No pleural effusion. No acute consolidative airspace disease, lung masses or significant pulmonary nodules. Musculoskeletal: No aggressive appearing focal osseous lesions. Marked thoracic spondylosis. CT ABDOMEN PELVIS FINDINGS Hepatobiliary: Normal liver size. Subcentimeter posterior right liver hypodense lesion is too small to characterize (series 2/image 45). No additional liver lesions. Cholelithiasis. Scattered gas within the contracted fundal gallbladder. No definite gallbladder wall thickening or pericholecystic fluid. No biliary ductal dilatation. Pancreas: Normal, with no mass or duct dilation. Spleen: Normal size. No mass. Adrenals/Urinary Tract: No discrete adrenal nodules. Mild fullness of the central right renal collecting system without overt right hydronephrosis. No left hydronephrosis. Symmetric contrast nephrograms. Scattered subcentimeter hypodense renal cortical lesions in both kidneys  are too small to characterize and require no follow-up. Small parapelvic renal cysts in the left kidney. Normal bladder. Stomach/Bowel: Normal non-distended stomach. Normal caliber small bowel with no small bowel wall thickening. Normal appendix. Oral contrast transits to the left colon. Marked colonic diverticulosis, most prominent in the sigmoid colon, with no definite large bowel wall thickening or significant pericolonic fat stranding. Vascular/Lymphatic: Atherosclerotic nonaneurysmal abdominal aorta. Patent portal, splenic, hepatic and renal veins. IVC filter in place within the infrarenal IVC. Right external iliac vein stent. Extensive retroperitoneal and bilateral pelvic lymphadenopathy involving aortocaval, left para-aortic, bilateral common iliac, bilateral external iliac and right inguinal nodal chains. Representative enlarged 1.8 cm right inguinal node (series 7/image 103), 2.0 cm right external iliac node (series 7/image 90), 2.1 cm right common iliac node (series 7/image 76), 2.0 cm posterior aortocaval node (series 7/image 64) and 1.6 cm left para-aortic node (series 7/image 62). Reproductive: Enlarged uterus. Infiltrative poorly marginated heterogeneously enhancing 7.0 x 4.3 x 5.0 cm midline pelvic mass centered in the region of the vaginal fornix with involvement of the uterine cervix and intimate association with the anterior rectal wall. Scattered gas in the uterine cavity. Apparent 7.5 cm anterior uterine body solid mass, nonspecific, potentially a fibroid. No adnexal masses. Other: No pneumoperitoneum, ascites or focal fluid collection. Musculoskeletal: No aggressive appearing focal osseous lesions. 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 Chronic appearing osteolytic change at L3-4. IMPRESSION: 1. Acute bilateral pulmonary embolism with large saddle embolus. Dilated main pulmonary artery suggesting pulmonary hypertension. Patient will be brought  immediately to the emergency department at [facility] from the lobby of the radiology department per instructions of Dr. Denman George. 2. Infiltrative poorly marginated heterogeneously enhancing  7.0 x 4.3 x 5.0 cm midline pelvic mass centered in the region of the vaginal fornix with involvement of the uterine cervix and with intimate association with the anterior rectal wall, suspicious for malignancy of uncertain primary site. 3. Extensive lymphadenopathy throughout the mediastinum, retroperitoneum and bilateral pelvis, likely metastatic adenopathy. 4. Enlarged uterus with apparent 7.5 cm anterior uterine body solid mass, nonspecific, potentially large uterine fibroid. No adnexal masses. No ascites. 5. Cholelithiasis. Nonspecific gas in the fundal gallbladder. Consider short-term follow-up CT abdomen to exclude emphysematous cholecystitis. No biliary ductal dilatation. 6. Marked colonic diverticulosis. 7.  Aortic Atherosclerosis (ICD10-I70.0). Critical Value/emergent results were called by telephone at the time of interpretation on 09/08/2019 at 3:20 pm to provider EMMA ROSSI, MD, who verbally acknowledged these results. Electronically Signed   By: Ilona Sorrel M.D.   On: 09/08/2019 15:46   Ct Abdomen Pelvis W Contrast  Result Date: 09/08/2019 CLINICAL DATA:  Uterine and cervical mass on exam. Gyn malignancy is suspected. Staging evaluation. History of right breast cancer. Dyspnea. EXAM: CT CHEST, ABDOMEN, AND PELVIS WITH CONTRAST TECHNIQUE: Multidetector CT imaging of the chest, abdomen and pelvis was performed following the standard protocol during bolus administration of intravenous contrast. CONTRAST:  19mL OMNIPAQUE IOHEXOL 300 MG/ML  SOLN COMPARISON:  07/14/2019 chest radiograph. FINDINGS: CT CHEST FINDINGS Cardiovascular: Normal heart size. No significant pericardial effusion/thickening. Atherosclerotic nonaneurysmal thoracic aorta. Dilated main pulmonary artery (3.5 cm diameter). There is acute  bilateral pulmonary embolism including a large saddle embolus. RV/LV ratio 0.90. Mediastinum/Nodes: No discrete thyroid nodules. Unremarkable esophagus. Surgical clips in the right axilla. No pathologically enlarged axillary nodes. Right paratracheal adenopathy up to 2.0 cm short axis diameter (series 2/image 18). Enlarged 2.1 cm AP window node (series 2/image 20). Left prevascular adenopathy up to 1.1 cm (series 2/image 17). Enlarged 1.1 cm left posterior mediastinal node along the descending aorta (series 2/image 26). Bilateral retrocrural adenopathy measuring up to 1.3 cm on the right (series 2/image 46). Lungs/Pleura: No pneumothorax. No pleural effusion. No acute consolidative airspace disease, lung masses or significant pulmonary nodules. Musculoskeletal: No aggressive appearing focal osseous lesions. Marked thoracic spondylosis. CT ABDOMEN PELVIS FINDINGS Hepatobiliary: Normal liver size. Subcentimeter posterior right liver hypodense lesion is too small to characterize (series 2/image 45). No additional liver lesions. Cholelithiasis. Scattered gas within the contracted fundal gallbladder. No definite gallbladder wall thickening or pericholecystic fluid. No biliary ductal dilatation. Pancreas: Normal, with no mass or duct dilation. Spleen: Normal size. No mass. Adrenals/Urinary Tract: No discrete adrenal nodules. Mild fullness of the central right renal collecting system without overt right hydronephrosis. No left hydronephrosis. Symmetric contrast nephrograms. Scattered subcentimeter hypodense renal cortical lesions in both kidneys are too small to characterize and require no follow-up. Small parapelvic renal cysts in the left kidney. Normal bladder. Stomach/Bowel: Normal non-distended stomach. Normal caliber small bowel with no small bowel wall thickening. Normal appendix. Oral contrast transits to the left colon. Marked colonic diverticulosis, most prominent in the sigmoid colon, with no definite large  bowel wall thickening or significant pericolonic fat stranding. Vascular/Lymphatic: Atherosclerotic nonaneurysmal abdominal aorta. Patent portal, splenic, hepatic and renal veins. IVC filter in place within the infrarenal IVC. Right external iliac vein stent. Extensive retroperitoneal and bilateral pelvic lymphadenopathy involving aortocaval, left para-aortic, bilateral common iliac, bilateral external iliac and right inguinal nodal chains. Representative enlarged 1.8 cm right inguinal node (series 7/image 103), 2.0 cm right external iliac node (series 7/image 90), 2.1 cm right common iliac node (series 7/image 76), 2.0 cm posterior aortocaval node (  series 7/image 64) and 1.6 cm left para-aortic node (series 7/image 62). Reproductive: Enlarged uterus. Infiltrative poorly marginated heterogeneously enhancing 7.0 x 4.3 x 5.0 cm midline pelvic mass centered in the region of the vaginal fornix with involvement of the uterine cervix and intimate association with the anterior rectal wall. Scattered gas in the uterine cavity. Apparent 7.5 cm anterior uterine body solid mass, nonspecific, potentially a fibroid. No adnexal masses. Other: No pneumoperitoneum, ascites or focal fluid collection. Musculoskeletal: No aggressive appearing focal osseous lesions. 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 Chronic appearing osteolytic change at L3-4. IMPRESSION: 1. Acute bilateral pulmonary embolism with large saddle embolus. Dilated main pulmonary artery suggesting pulmonary hypertension. Patient will be brought immediately to the emergency department at [facility] from the lobby of the radiology department per instructions of Dr. Denman George. 2. Infiltrative poorly marginated heterogeneously enhancing 7.0 x 4.3 x 5.0 cm midline pelvic mass centered in the region of the vaginal fornix with involvement of the uterine cervix and with intimate association with the anterior rectal wall,  suspicious for malignancy of uncertain primary site. 3. Extensive lymphadenopathy throughout the mediastinum, retroperitoneum and bilateral pelvis, likely metastatic adenopathy. 4. Enlarged uterus with apparent 7.5 cm anterior uterine body solid mass, nonspecific, potentially large uterine fibroid. No adnexal masses. No ascites. 5. Cholelithiasis. Nonspecific gas in the fundal gallbladder. Consider short-term follow-up CT abdomen to exclude emphysematous cholecystitis. No biliary ductal dilatation. 6. Marked colonic diverticulosis. 7.  Aortic Atherosclerosis (ICD10-I70.0). Critical Value/emergent results were called by telephone at the time of interpretation on 09/08/2019 at 3:20 pm to provider EMMA ROSSI, MD, who verbally acknowledged these results. Electronically Signed   By: Ilona Sorrel M.D.   On: 09/08/2019 15:46   US Venous Img Lower Unilateral Right  Result Date: 08/15/2019 CLINICAL DATA:  78 year old female with a history of right leg swelling for a week EXAM: RIGHT LOWER EXTREMITY VENOUS DOPPLER ULTRASOUND TECHNIQUE: Gray-scale sonography with graded compression, as well as color Doppler and duplex ultrasound were performed to evaluate the lower extremity deep venous systems from the level of the common femoral vein and including the common femoral, femoral, profunda femoral, popliteal and calf veins including the posterior tibial, peroneal and gastrocnemius veins when visible. The superficial great saphenous vein was also interrogated. Spectral Doppler was utilized to evaluate flow at rest and with distal augmentation maneuvers in the common femoral, femoral and popliteal veins. COMPARISON:  None. FINDINGS: Contralateral Common Femoral Vein: Respiratory phasicity is normal and symmetric with the symptomatic side. No evidence of thrombus. Normal compressibility. DVT involving the common femoral vein, femoral vein, popliteal vein, into the peroneal vein and posterior tibial vein. Thrombus extends into the  saphenofemoral junction as well as into the profunda vein. Thrombus at the common femoral vein is partially occlusive. Other Findings:  None. IMPRESSION: Sonographic survey of the right lower extremity positive for acute DVT of the common femoral vein, profunda vein, saphenofemoral junction, and through the length of the femoral vein, popliteal vein, into the tibial veins. The thrombus at the common femoral vein is partially occlusive, and uncertain to what degree thrombus extends into the pelvis. Electronically Signed   By: Corrie Mckusick D.O.   On: 08/15/2019 15:57   US Pelvic Complete With Transvaginal  Result Date: 08/25/2019 CLINICAL DATA:  Postmenopausal bleeding EXAM: TRANSABDOMINAL AND TRANSVAGINAL ULTRASOUND OF PELVIS TECHNIQUE: Both transabdominal and transvaginal ultrasound examinations of the pelvis were performed. Transabdominal technique was performed for global imaging of the pelvis including  uterus, ovaries, adnexal regions, and pelvic cul-de-sac. It was necessary to proceed with endovaginal exam following the transabdominal exam to visualize the ovaries. COMPARISON:  None FINDINGS: Uterus Measurements: 8.9 x 5.8 x 7.0 cm. = volume: 190 mL. A 6.9 x 7.0 cm heterogeneous mass is noted in the region of the uterine fundus. Additionally there is a area of increased echogenicity within the superior aspect of the vagina near the cervix which may represent a second separate mass lesion. Correlation with the physical exam is recommended. MRI may be helpful for further delineation. Endometrium Obscured by the underlying heterogeneous mass. Right ovary Not visualized Left ovary Not visualized Other findings No definitive free fluid is seen. IMPRESSION: Heterogeneous mass in the fundus of the uterus as well as a mass in the region of the cervix and vagina. These are felt to represent underlying neoplasm. Direct visualization is recommended as well as possible MRI of the pelvis for further delineation.  Electronically Signed   By: Inez Catalina M.D.   On: 08/25/2019 11:16   Vas [place] Lower Extremity Venous (dvt)  Result Date: 09/05/2019  Lower Venous Study Indications: Edema.  Risk Factors: DVT Rt LE DVT Surgery 08/15/2019: Inferior Venocavogram; Placement of a [place] IVC Filter Infrarenal. Mechanical Thrombectomy of the Right CFV, Right External iliac Vein and Right Common Eliac vein. Comparison Study: 08/15/2019 Performing Technologist: Almira Coaster RVS  Examination Guidelines: A complete evaluation includes B-mode imaging, spectral Doppler, color Doppler, and power Doppler as needed of all accessible portions of each vessel. Bilateral testing is considered an integral part of a complete examination. Limited examinations for reoccurring indications may be performed as noted.  +---------+---------------+---------+-----------+----------+--------------+  RIGHT     Compressibility Phasicity Spontaneity Properties Thrombus Aging  +---------+---------------+---------+-----------+----------+--------------+  CFV       Full            Yes       Yes                                    +---------+---------------+---------+-----------+----------+--------------+  SFJ       Full            Yes       Yes                                    +---------+---------------+---------+-----------+----------+--------------+  FV Prox   None            No        No                                     +---------+---------------+---------+-----------+----------+--------------+  FV Mid    None            No        No                                     +---------+---------------+---------+-----------+----------+--------------+  FV Distal None            No        No                                     +---------+---------------+---------+-----------+----------+--------------+  PFV       Full            Yes       Yes                                    +---------+---------------+---------+-----------+----------+--------------+  POP       None             No        No                                     +---------+---------------+---------+-----------+----------+--------------+  PTV       Full            Yes       Yes                                    +---------+---------------+---------+-----------+----------+--------------+  PERO      Full            Yes       Yes                                    +---------+---------------+---------+-----------+----------+--------------+  GSV       Full            Yes       Yes                                    +---------+---------------+---------+-----------+----------+--------------+   +----+---------------+---------+-----------+----------+--------------+  LEFT Compressibility Phasicity Spontaneity Properties Thrombus Aging  +----+---------------+---------+-----------+----------+--------------+  CFV  Full            Yes       Yes                                    +----+---------------+---------+-----------+----------+--------------+     Summary: Right: Findings consistent with acute deep vein thrombosis involving the right femoral vein, and right popliteal vein. Essentially Unchanged, the Right CFV appears to no evidence of DVT.  *See table(s) above for measurements and observations. Electronically signed by [name] MD on 09/05/2019 at 5:26:00 PM.    Final      Antimicrobials:   NONE   Subjective: NO ACUTE EVENTS OVERNIGHT Reports respiratory status stable No pain  Objective: Vitals:   09/10/19 0418 09/10/19 0500 09/10/19 0600 09/10/19 0800  BP:  (!) 144/90 (!) 175/66 (!) 154/83  Pulse:  89 87 90  Resp:  (!) 21 16 20   Temp:    98 F (36.7 C)  TempSrc:    Oral  SpO2:  98% 99% 97%  Weight: 110 kg     Height:        Intake/Output Summary (Last 24 hours) at 09/10/2019 1238 Last data filed at 09/10/2019 1010 Gross per 24 hour  Intake 1196.96 ml  Output 2400 ml  Net -1203.04 ml   Filed Weights   09/08/19 1637 09/10/19 0418  Weight: 110.2 kg 110 kg    Examination:  General  exam: Appears calm and  comfortable  Respiratory system: Clear to auscultation. Respiratory effort normal. Cardiovascular system: S1 & S2 heard, RRR. No JVD, murmurs, rubs, gallops or clicks. No pedal edema. Gastrointestinal system: Abdomen is nondistended, soft and nontender. No organomegaly or masses felt. Normal bowel sounds heard. Central nervous system: Alert and oriented. No focal neurological deficits. Extremities: Warm well perfused. Skin: No rashes, lesions or ulcers Psychiatry: Judgement and insight appear normal. Mood & affect appropriate.     Data Reviewed: I have personally reviewed following labs and imaging studies  CBC: Recent Labs  Lab 09/08/19 1725 09/09/19 0440 09/10/19 0854  WBC 12.0* 9.9 10.9*  NEUTROABS 10.0*  --   --   HGB 9.8* 8.9* 9.7*  HCT 33.6* 30.6* 33.1*  MCV 96.8 95.9 95.7  PLT 282 264 [id number]   Basic Metabolic Panel: Recent Labs  Lab 09/08/19 1725 09/09/19 0440 09/10/19 0854  NA 140 140 139  K 4.0 4.1 4.4  CL 102 106 101  CO2 28 27 28   GLUCOSE 65* 71 106*  BUN 13 13 13   CREATININE 0.66 0.55 0.65  CALCIUM 8.9 8.4* 8.9   GFR: Estimated Creatinine Clearance: 68.4 mL/min (by C-G formula based on SCr of 0.65 mg/dL). Liver Function Tests: Recent Labs  Lab 09/08/19 1725 09/09/19 0440  AST 28 30  ALT 28 26  ALKPHOS 83 75  BILITOT 0.4 0.5  PROT 7.2 6.6  ALBUMIN 2.7* 2.6*   No results for input(s): LIPASE, AMYLASE in the last 168 hours. No results for input(s): AMMONIA in the last 168 hours. Coagulation Profile: Recent Labs  Lab 09/08/19 1725  INR 1.4*   Cardiac Enzymes: No results for input(s): CKTOTAL, CKMB, CKMBINDEX, TROPONINI in the last 168 hours. BNP (last 3 results) No results for input(s): PROBNP in the last 8760 hours. HbA1C: No results for input(s): HGBA1C in the last 72 hours. CBG: Recent Labs  Lab 09/09/19 1111 09/09/19 1649 09/09/19 2213 09/10/19 0755 09/10/19 1158  GLUCAP 105* 128* 116* 103* 172*   Lipid  Profile: No results for input(s): CHOL, HDL, LDLCALC, TRIG, CHOLHDL, LDLDIRECT in the last 72 hours. Thyroid Function Tests: No results for input(s): TSH, T4TOTAL, FREET4, T3FREE, THYROIDAB in the last 72 hours. Anemia Panel: Recent Labs    09/10/19 0854  FERRITIN 144  TIBC 250  IRON 18*   Sepsis Labs: No results for input(s): PROCALCITON, LATICACIDVEN in the last 168 hours.  Recent Results (from the past 240 hour(s))  SARS CORONAVIRUS 2 (TAT 6-24 HRS) Nasopharyngeal Nasopharyngeal Swab     Status: None   Collection Time: 09/08/19  7:47 PM   Specimen: Nasopharyngeal Swab  Result Value Ref Range Status   SARS Coronavirus 2 NEGATIVE NEGATIVE Final    Comment: (NOTE) SARS-CoV-2 target nucleic acids are NOT DETECTED. The SARS-CoV-2 RNA is generally detectable in upper and lower respiratory specimens during the acute phase of infection. Negative results do not preclude SARS-CoV-2 infection, do not rule out co-infections with other pathogens, and should not be used as the sole basis for treatment or other patient management decisions. Negative results must be combined with clinical observations, patient history, and epidemiological information. The expected result is Negative. Fact Sheet for Patients: SugarRoll.be Fact Sheet for Healthcare Providers: https://www.woods-mathews.com/ This test is not yet approved or cleared by the [place] FDA and  has been authorized for detection and/or diagnosis of SARS-CoV-2 by FDA under an Emergency Use Authorization (EUA). This EUA will remain  in effect (meaning this test can be used) for the duration of  the COVID-19 declaration under Section 56 4(b)(1) of the Act, 21 U.S.C. section 360bbb-3(b)(1), unless the authorization is terminated or revoked sooner. Performed at [facility], [place] [address], [place], [place] [address]   MRSA PCR Screening     Status: None   Collection Time: 09/09/19  12:55 PM   Specimen: Nasal Mucosa; Nasopharyngeal  Result Value Ref Range Status   MRSA by PCR NEGATIVE NEGATIVE Final    Comment:        The GeneXpert MRSA Assay (FDA approved for NASAL specimens only), is one component of a comprehensive MRSA colonization surveillance program. It is not intended to diagnose MRSA infection nor to guide or monitor treatment for MRSA infections. Performed at [facility], [place] [address], [place], [place] [address]          Radiology Studies: Ct Head Wo Contrast  Result Date: 09/08/2019 CLINICAL DATA:  [place] in bathroom recent CT with positive PE EXAM: CT HEAD WITHOUT CONTRAST TECHNIQUE: Contiguous axial images were obtained from the base of the skull through the vertex without intravenous contrast. COMPARISON:  CT 09/08/2019, 04/13/2019 FINDINGS: Brain: No acute territorial infarction, hemorrhage or intracranial mass. Atrophy and moderate hypodensity in the white matter consistent with small vessel ischemic change. Stable ventricle size Vascular: No hyperdense vessels.  Carotid vascular calcification Skull: Normal. Negative for fracture or focal lesion. Sinuses/Orbits: Postsurgical changes of the maxillary and ethmoid sinuses with mild mucosal thickening. Other: Small left forehead soft tissue swelling IMPRESSION: 1. No CT evidence for acute intracranial abnormality. 2. Atrophy and small vessel ischemic changes of white matter. Electronically Signed   By: Donavan Foil M.D.   On: 09/08/2019 19:11   Ct Chest W Contrast  Result Date: 09/08/2019 CLINICAL DATA:  Uterine and cervical mass on exam. Gyn malignancy is suspected. Staging evaluation. History of right breast cancer. Dyspnea. EXAM: CT CHEST, ABDOMEN, AND PELVIS WITH CONTRAST TECHNIQUE: Multidetector CT imaging of the chest, abdomen and pelvis was performed following the standard protocol during bolus administration of intravenous contrast. CONTRAST:  124mL OMNIPAQUE IOHEXOL 300  MG/ML  SOLN COMPARISON:  07/14/2019 chest radiograph. FINDINGS: CT CHEST FINDINGS Cardiovascular: Normal heart size. No significant pericardial effusion/thickening. Atherosclerotic nonaneurysmal thoracic aorta. Dilated main pulmonary artery (3.5 cm diameter). There is acute bilateral pulmonary embolism including a large saddle embolus. RV/LV ratio 0.90. Mediastinum/Nodes: No discrete thyroid nodules. Unremarkable esophagus. Surgical clips in the right axilla. No pathologically enlarged axillary nodes. Right paratracheal adenopathy up to 2.0 cm short axis diameter (series 2/image 18). Enlarged 2.1 cm AP window node (series 2/image 20). Left prevascular adenopathy up to 1.1 cm (series 2/image 17). Enlarged 1.1 cm left posterior mediastinal node along the descending aorta (series 2/image 26). Bilateral retrocrural adenopathy measuring up to 1.3 cm on the right (series 2/image 46). Lungs/Pleura: No pneumothorax. No pleural effusion. No acute consolidative airspace disease, lung masses or significant pulmonary nodules. Musculoskeletal: No aggressive appearing focal osseous lesions. Marked thoracic spondylosis. CT ABDOMEN PELVIS FINDINGS Hepatobiliary: Normal liver size. Subcentimeter posterior right liver hypodense lesion is too small to characterize (series 2/image 45). No additional liver lesions. Cholelithiasis. Scattered gas within the contracted fundal gallbladder. No definite gallbladder wall thickening or pericholecystic fluid. No biliary ductal dilatation. Pancreas: Normal, with no mass or duct dilation. Spleen: Normal size. No mass. Adrenals/Urinary Tract: No discrete adrenal nodules. Mild fullness of the central right renal collecting system without overt right hydronephrosis. No left hydronephrosis. Symmetric contrast nephrograms. Scattered subcentimeter hypodense renal cortical lesions in both kidneys  are too small to characterize and require no follow-up. Small parapelvic renal cysts in the left kidney.  Normal bladder. Stomach/Bowel: Normal non-distended stomach. Normal caliber small bowel with no small bowel wall thickening. Normal appendix. Oral contrast transits to the left colon. Marked colonic diverticulosis, most prominent in the sigmoid colon, with no definite large bowel wall thickening or significant pericolonic fat stranding. Vascular/Lymphatic: Atherosclerotic nonaneurysmal abdominal aorta. Patent portal, splenic, hepatic and renal veins. IVC filter in place within the infrarenal IVC. Right external iliac vein stent. Extensive retroperitoneal and bilateral pelvic lymphadenopathy involving aortocaval, left para-aortic, bilateral common iliac, bilateral external iliac and right inguinal nodal chains. Representative enlarged 1.8 cm right inguinal node (series 7/image 103), 2.0 cm right external iliac node (series 7/image 90), 2.1 cm right common iliac node (series 7/image 76), 2.0 cm posterior aortocaval node (series 7/image 64) and 1.6 cm left para-aortic node (series 7/image 62). Reproductive: Enlarged uterus. Infiltrative poorly marginated heterogeneously enhancing 7.0 x 4.3 x 5.0 cm midline pelvic mass centered in the region of the vaginal fornix with involvement of the uterine cervix and intimate association with the anterior rectal wall. Scattered gas in the uterine cavity. Apparent 7.5 cm anterior uterine body solid mass, nonspecific, potentially a fibroid. No adnexal masses. Other: No pneumoperitoneum, ascites or focal fluid collection. Musculoskeletal: No aggressive appearing focal osseous lesions. 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 Chronic appearing osteolytic change at L3-4. IMPRESSION: 1. Acute bilateral pulmonary embolism with large saddle embolus. Dilated main pulmonary artery suggesting pulmonary hypertension. Patient will be brought immediately to the emergency department at [facility] from the lobby of the radiology department per  instructions of Dr. Denman George. 2. Infiltrative poorly marginated heterogeneously enhancing 7.0 x 4.3 x 5.0 cm midline pelvic mass centered in the region of the vaginal fornix with involvement of the uterine cervix and with intimate association with the anterior rectal wall, suspicious for malignancy of uncertain primary site. 3. Extensive lymphadenopathy throughout the mediastinum, retroperitoneum and bilateral pelvis, likely metastatic adenopathy. 4. Enlarged uterus with apparent 7.5 cm anterior uterine body solid mass, nonspecific, potentially large uterine fibroid. No adnexal masses. No ascites. 5. Cholelithiasis. Nonspecific gas in the fundal gallbladder. Consider short-term follow-up CT abdomen to exclude emphysematous cholecystitis. No biliary ductal dilatation. 6. Marked colonic diverticulosis. 7.  Aortic Atherosclerosis (ICD10-I70.0). Critical Value/emergent results were called by telephone at the time of interpretation on 09/08/2019 at 3:20 pm to provider EMMA ROSSI, MD, who verbally acknowledged these results. Electronically Signed   By: Ilona Sorrel M.D.   On: 09/08/2019 15:46   Ct Abdomen Pelvis W Contrast  Result Date: 09/08/2019 CLINICAL DATA:  Uterine and cervical mass on exam. Gyn malignancy is suspected. Staging evaluation. History of right breast cancer. Dyspnea. EXAM: CT CHEST, ABDOMEN, AND PELVIS WITH CONTRAST TECHNIQUE: Multidetector CT imaging of the chest, abdomen and pelvis was performed following the standard protocol during bolus administration of intravenous contrast. CONTRAST:  138mL OMNIPAQUE IOHEXOL 300 MG/ML  SOLN COMPARISON:  07/14/2019 chest radiograph. FINDINGS: CT CHEST FINDINGS Cardiovascular: Normal heart size. No significant pericardial effusion/thickening. Atherosclerotic nonaneurysmal thoracic aorta. Dilated main pulmonary artery (3.5 cm diameter). There is acute bilateral pulmonary embolism including a large saddle embolus. RV/LV ratio 0.90. Mediastinum/Nodes: No discrete  thyroid nodules. Unremarkable esophagus. Surgical clips in the right axilla. No pathologically enlarged axillary nodes. Right paratracheal adenopathy up to 2.0 cm short axis diameter (series 2/image 18). Enlarged 2.1 cm AP window node (series 2/image 20). Left prevascular adenopathy up to  1.1 cm (series 2/image 17). Enlarged 1.1 cm left posterior mediastinal node along the descending aorta (series 2/image 26). Bilateral retrocrural adenopathy measuring up to 1.3 cm on the right (series 2/image 46). Lungs/Pleura: No pneumothorax. No pleural effusion. No acute consolidative airspace disease, lung masses or significant pulmonary nodules. Musculoskeletal: No aggressive appearing focal osseous lesions. Marked thoracic spondylosis. CT ABDOMEN PELVIS FINDINGS Hepatobiliary: Normal liver size. Subcentimeter posterior right liver hypodense lesion is too small to characterize (series 2/image 45). No additional liver lesions. Cholelithiasis. Scattered gas within the contracted fundal gallbladder. No definite gallbladder wall thickening or pericholecystic fluid. No biliary ductal dilatation. Pancreas: Normal, with no mass or duct dilation. Spleen: Normal size. No mass. Adrenals/Urinary Tract: No discrete adrenal nodules. Mild fullness of the central right renal collecting system without overt right hydronephrosis. No left hydronephrosis. Symmetric contrast nephrograms. Scattered subcentimeter hypodense renal cortical lesions in both kidneys are too small to characterize and require no follow-up. Small parapelvic renal cysts in the left kidney. Normal bladder. Stomach/Bowel: Normal non-distended stomach. Normal caliber small bowel with no small bowel wall thickening. Normal appendix. Oral contrast transits to the left colon. Marked colonic diverticulosis, most prominent in the sigmoid colon, with no definite large bowel wall thickening or significant pericolonic fat stranding. Vascular/Lymphatic: Atherosclerotic nonaneurysmal  abdominal aorta. Patent portal, splenic, hepatic and renal veins. IVC filter in place within the infrarenal IVC. Right external iliac vein stent. Extensive retroperitoneal and bilateral pelvic lymphadenopathy involving aortocaval, left para-aortic, bilateral common iliac, bilateral external iliac and right inguinal nodal chains. Representative enlarged 1.8 cm right inguinal node (series 7/image 103), 2.0 cm right external iliac node (series 7/image 90), 2.1 cm right common iliac node (series 7/image 76), 2.0 cm posterior aortocaval node (series 7/image 64) and 1.6 cm left para-aortic node (series 7/image 62). Reproductive: Enlarged uterus. Infiltrative poorly marginated heterogeneously enhancing 7.0 x 4.3 x 5.0 cm midline pelvic mass centered in the region of the vaginal fornix with involvement of the uterine cervix and intimate association with the anterior rectal wall. Scattered gas in the uterine cavity. Apparent 7.5 cm anterior uterine body solid mass, nonspecific, potentially a fibroid. No adnexal masses. Other: No pneumoperitoneum, ascites or focal fluid collection. Musculoskeletal: No aggressive appearing focal osseous lesions. 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 Chronic appearing osteolytic change at L3-4. IMPRESSION: 1. Acute bilateral pulmonary embolism with large saddle embolus. Dilated main pulmonary artery suggesting pulmonary hypertension. Patient will be brought immediately to the emergency department at [facility] from the lobby of the radiology department per instructions of Dr. Denman George. 2. Infiltrative poorly marginated heterogeneously enhancing 7.0 x 4.3 x 5.0 cm midline pelvic mass centered in the region of the vaginal fornix with involvement of the uterine cervix and with intimate association with the anterior rectal wall, suspicious for malignancy of uncertain primary site. 3. Extensive lymphadenopathy throughout the mediastinum,  retroperitoneum and bilateral pelvis, likely metastatic adenopathy. 4. Enlarged uterus with apparent 7.5 cm anterior uterine body solid mass, nonspecific, potentially large uterine fibroid. No adnexal masses. No ascites. 5. Cholelithiasis. Nonspecific gas in the fundal gallbladder. Consider short-term follow-up CT abdomen to exclude emphysematous cholecystitis. No biliary ductal dilatation. 6. Marked colonic diverticulosis. 7.  Aortic Atherosclerosis (ICD10-I70.0). Critical Value/emergent results were called by telephone at the time of interpretation on 09/08/2019 at 3:20 pm to provider EMMA ROSSI, MD, who verbally acknowledged these results. Electronically Signed   By: Ilona Sorrel M.D.   On: 09/08/2019 15:46  Scheduled Meds:  acidophilus   Oral Daily   Chlorhexidine Gluconate Cloth  6 each Topical Daily   cholecalciferol  5,000 Units Oral Daily   ferrous sulfate  325 mg Oral Q breakfast   insulin aspart  0-5 Units Subcutaneous QHS   insulin aspart  0-9 Units Subcutaneous TID WC   lisinopril  20 mg Oral Daily   loratadine  10 mg Oral Daily   mouth rinse  15 mL Mouth Rinse BID   memantine  10 mg Oral BID   nitrofurantoin  100 mg Oral BID   rosuvastatin  10 mg Oral q1800   sertraline  100 mg Oral Daily   Continuous Infusions:  heparin 1,600 Units/hr (09/10/19 0959)     LOS: 2 days    Time spent: [place]    Nicolette Bang, MD [facility]  If 7PM-7AM, please contact night-coverage  09/10/2019, 12:38 PM

## 2019-09-11 ENCOUNTER — Inpatient Hospital Stay (HOSPITAL_COMMUNITY): Payer: Medicare PPO

## 2019-09-11 LAB — GLUCOSE, CAPILLARY
Glucose-Capillary: 83 mg/dL (ref 70–99)
Glucose-Capillary: 115 mg/dL — ABNORMAL HIGH (ref 70–99)
Glucose-Capillary: 144 mg/dL — ABNORMAL HIGH (ref 70–99)
Glucose-Capillary: 113 mg/dL — ABNORMAL HIGH (ref 70–99)

## 2019-09-11 LAB — BASIC METABOLIC PANEL
Anion gap: 11 (ref 5–15)
BUN: 15 mg/dL (ref 8–23)
CO2: 29 mmol/L (ref 22–32)
Calcium: 9.5 mg/dL (ref 8.9–10.3)
Chloride: 100 mmol/L (ref 98–111)
Creatinine, Ser: 0.58 mg/dL (ref 0.44–1.00)
GFR calc Af Amer: >60 mL/min (ref >60)
GFR calc non Af Amer: >60 mL/min (ref >60)
Glucose, Bld: 94 mg/dL (ref 70–99)
Potassium: 4.7 mmol/L (ref 3.5–5.1)
Sodium: 140 mmol/L (ref 135–145)

## 2019-09-11 LAB — CBC
HCT: 34.6 % — ABNORMAL LOW (ref 36.0–46.0)
Hemoglobin: 9.9 g/dL — ABNORMAL LOW (ref 12.0–15.0)
MCH: 27.6 pg (ref 26.0–34.0)
MCHC: 28.6 g/dL — ABNORMAL LOW (ref 30.0–36.0)
MCV: 96.4 fL (ref 80.0–100.0)
Platelets: 325 10*3/uL (ref 150–400)
RBC: 3.59 MIL/uL — ABNORMAL LOW (ref 3.87–5.11)
RDW: 15.1 % (ref 11.5–15.5)
WBC: 9.8 10*3/uL (ref 4.0–10.5)
nRBC: 0 % (ref 0.0–0.2)

## 2019-09-11 LAB — APTT: aPTT: 82 seconds — ABNORMAL HIGH (ref 24–36)

## 2019-09-11 LAB — HEPARIN LEVEL (UNFRACTIONATED): Heparin Unfractionated: 0.66 IU/mL (ref 0.30–0.70)

## 2019-09-11 MED ORDER — AMLODIPINE BESYLATE 5 MG PO TABS
5.0000 mg | ORAL_TABLET | Freq: Every day | ORAL | Status: DC
  Administered 2019-09-11 – 2019-09-12 (×2): 5 mg via ORAL
  Filled 2019-09-11 (×2): qty 1

## 2019-09-11 MED ORDER — ENOXAPARIN SODIUM 120 MG/0.8ML ~~LOC~~ SOLN
110.0000 mg | Freq: Two times a day (BID) | SUBCUTANEOUS | Status: DC
  Administered 2019-09-11 – 2019-09-12 (×3): 110 mg via SUBCUTANEOUS
  Filled 2019-09-11 (×3): qty 0.73

## 2019-09-11 NOTE — Plan of Care (Signed)

## 2019-09-11 NOTE — Progress Notes (Signed)
ANTICOAGULATION CONSULT NOTE - Follow Up Consult  Pharmacy Consult for heparin--> LMWH  Indication: hx RLE DVT (08/17/19) and acute pulmonary embolus  Allergies  Allergen Reactions  . Other Shortness Of Breath, Swelling and Other (See Comments)    Cats, dogs, mold Induced asthma Cats, dogs, mold    Patient Measurements: Height: 5' (152.4 cm) Weight: 242 lb 8.1 oz (110 kg) IBW/kg (Calculated) : 45.5 Heparin Dosing Weight: 73 kg  Vital Signs: Temp: 97.6 F (36.4 C) (09/20 0800) Temp Source: Oral (09/20 0800) BP: 161/76 (09/20 0800) Pulse Rate: 85 (09/20 0952)  Labs: Recent Labs    09/08/19 1725  09/08/19 2318 09/09/19 0330 09/09/19 0440  09/09/19 2155 09/10/19 0854 09/10/19 0855 09/11/19 0744  HGB 9.8*  --   --   --  8.9*  --   --  9.7*  --  9.9*  HCT 33.6*  --   --   --  30.6*  --   --  33.1*  --  34.6*  PLT 282  --   --   --  264  --   --  295  --  325  APTT 35  --   --   --  71*   < > 67* 88*  --  82*  LABPROT 17.3*  --   --   --   --   --   --   --   --   --   INR 1.4*  --   --   --   --   --   --   --   --   --   HEPARINUNFRC  --    < >  --   --  1.64*  --   --   --  0.74* 0.66  CREATININE 0.66  --   --   --  0.55  --   --  0.65  --  0.58  TROPONINIHS  --   --  8 8  --   --   --   --   --   --    < > = values in this interval not displayed.    Estimated Creatinine Clearance: 68.4 mL/min (by C-G formula based on SCr of 0.58 mg/dL).   Medications:  - on Eliquis 5 mg bid PTA (last dose taken on 09/08/19)  Assessment: Patient's a 78 y.o F recently diagnosed with endometrial cancer with nodal metastasis and R LE DVT (on 08/17/19) on Eliquis 5 mg PTA, admitted to [facility] on 9/17 after an outpatient chest CTA showed acute bilateral PE with large saddle embolus.  Patient endorsed being compliant to Eliquis.  She also reported having vaginal bleeding from endometrial cancer with increased bleeding in the past few weeks. Heparin drip started on admission for acute PE.  Dr.  Burr Medico recommends to transition to [place] at discharge.  Dr. Wyonia Hough requested Rx to transition patient to [place] on 9/20  Today, 09/11/2019: - heparin level and aPTT therapeutic this morning heparin llevel - cbc relatively stable - per pt's RN, vaginal bleeding noted last night and this morning  Goal of Therapy:  Heparin level 0.3-0.7 units/ml aPTT 66-102 seconds Anti-Xa level 0.6-1 units/ml 4hrs after LMWH dose given Monitor platelets by anticoagulation protocol: Yes   Plan:  - d/c heparin drip at 10a. Give first dose of lovenox 110 mg SQ q12h at 11a (one hour after heparin drip has been d/ced). - continue with daily cbc for now d/t vaginal bleeding - monitor for  severity of vaginal bleeding  Roshun Klingensmith P 09/11/2019,9:54 AM

## 2019-09-11 NOTE — Progress Notes (Signed)
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 New pelvic mass, apparently had a CT scan to w/up her new pelvic mass and pulmonary embolism was seen. Pt sent to ED for evaluation   Assessment & Plan:   Principal Problem:   Pulmonary embolism ([place]) Active Problems:   Essential hypertension   HLD (hyperlipidemia)   Type 2 diabetes mellitus without complication (HCC)   OSA on CPAP   Memory disorder   PE Tele F/upcardiac echo acute vs chronic pulm htn, clinically stable, discuss w/IR Failed Eliquis Heparin GTT Discussed with HemOnc Dr Burr Medico who who saw in consultation Discussed with IR and reviewed CT with them, it was thought given pt significant co-morbidities and hemodynamic stability, advised against intervention 2/2 risk vs benefit evalaution  New Pelvic Mass Oncology as above Appreciate consult  Dm2, w hypoglycemia Continue to hold Glipizide C/w ADA diet fsbs ac and qhs, ISS  Hypertension Cont Lisinopril 20mg  po qday Monitor BP  Memory disorder Cont Namenda 10mg  po bid  Anxiety Cont Zoloft 100mg  po qday  DVT prophylaxis: On:HEP GTT  Code Status: FULL    Code Status Orders  (From admission, onward)         Start     Ordered   09/08/19 1940  Full code  Continuous     09/08/19 1941        Code Status History    Date Active Date Inactive Code Status Order ID Comments User Context   08/15/2019 2254 08/17/2019 1633 Full Code [id number]  Nicholes Mango, MD Inpatient   01/27/2019 2013 01/29/2019 1802 Full Code  [id number]  Marcellus Scott Inpatient   07/06/2017 1700 07/09/2017 2127 DNR [id number]  Baxter Hire, MD Inpatient   04/27/2017 1745 04/30/2017 1714 Full Code [id number]  Caren Griffins, MD Inpatient   07/29/2016 1949 07/30/2016 1433 Full Code [id number]  Fanny Skates, MD Inpatient   Advance Care Planning Activity     Family Communication: SPOKE WITH SISTER, SHARON  Disposition Plan:   Patient remained inpatient transfer out of stepdown unit today, transition to Lovenox.  If no bleeding over the next 24 hours likely discharge home tomorrow Consults called: None Admission status: Inpatient   Consultants:   [facility]  Procedures:  Dg Abd 1 View  Result Date: 09/11/2019 CLINICAL DATA:  Constipation.  Abdominal pain. EXAM: ABDOMEN - 1 VIEW COMPARISON:  None. FINDINGS: Normal bowel gas pattern.  No significant increase in colonic stool. 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 There is a right common iliac artery stent. Soft tissues otherwise unremarkable. No acute skeletal abnormality. IMPRESSION: 1. No acute findings.  No evidence of bowel obstruction. 2. No radiographic evidence of increased colonic stool. Electronically Signed   By: Lajean Manes M.D.   On: 09/11/2019 10:43   Ct Head Wo Contrast  Result Date: 09/08/2019 CLINICAL DATA:  [place] in bathroom recent CT with positive PE EXAM: CT HEAD WITHOUT CONTRAST TECHNIQUE: Contiguous axial images were obtained from the base of the skull through the vertex without intravenous contrast. COMPARISON:  CT 09/08/2019, 04/13/2019  FINDINGS: Brain: No acute territorial infarction, hemorrhage or intracranial mass. Atrophy and moderate hypodensity in the white matter consistent with small vessel ischemic change. Stable ventricle size Vascular: No hyperdense vessels.  Carotid vascular calcification Skull: Normal. Negative for fracture or focal lesion. Sinuses/Orbits: Postsurgical changes of the maxillary and ethmoid sinuses with mild mucosal  thickening. Other: Small left forehead soft tissue swelling IMPRESSION: 1. No CT evidence for acute intracranial abnormality. 2. Atrophy and small vessel ischemic changes of white matter. Electronically Signed   By: Donavan Foil M.D.   On: 09/08/2019 19:11   Ct Chest W Contrast  Result Date: 09/08/2019 CLINICAL DATA:  Uterine and cervical mass on exam. Gyn malignancy is suspected. Staging evaluation. History of right breast cancer. Dyspnea. EXAM: CT CHEST, ABDOMEN, AND PELVIS WITH CONTRAST TECHNIQUE: Multidetector CT imaging of the chest, abdomen and pelvis was performed following the standard protocol during bolus administration of intravenous contrast. CONTRAST:  119mL OMNIPAQUE IOHEXOL 300 MG/ML  SOLN COMPARISON:  07/14/2019 chest radiograph. FINDINGS: CT CHEST FINDINGS Cardiovascular: Normal heart size. No significant pericardial effusion/thickening. Atherosclerotic nonaneurysmal thoracic aorta. Dilated main pulmonary artery (3.5 cm diameter). There is acute bilateral pulmonary embolism including a large saddle embolus. RV/LV ratio 0.90. Mediastinum/Nodes: No discrete thyroid nodules. Unremarkable esophagus. Surgical clips in the right axilla. No pathologically enlarged axillary nodes. Right paratracheal adenopathy up to 2.0 cm short axis diameter (series 2/image 18). Enlarged 2.1 cm AP window node (series 2/image 20). Left prevascular adenopathy up to 1.1 cm (series 2/image 17). Enlarged 1.1 cm left posterior mediastinal node along the descending aorta (series 2/image 26). Bilateral retrocrural adenopathy measuring up to 1.3 cm on the right (series 2/image 46). Lungs/Pleura: No pneumothorax. No pleural effusion. No acute consolidative airspace disease, lung masses or significant pulmonary nodules. Musculoskeletal: No aggressive appearing focal osseous lesions. Marked thoracic spondylosis. CT ABDOMEN PELVIS FINDINGS Hepatobiliary: Normal liver size. Subcentimeter posterior right liver hypodense lesion is  too small to characterize (series 2/image 45). No additional liver lesions. Cholelithiasis. Scattered gas within the contracted fundal gallbladder. No definite gallbladder wall thickening or pericholecystic fluid. No biliary ductal dilatation. Pancreas: Normal, with no mass or duct dilation. Spleen: Normal size. No mass. Adrenals/Urinary Tract: No discrete adrenal nodules. Mild fullness of the central right renal collecting system without overt right hydronephrosis. No left hydronephrosis. Symmetric contrast nephrograms. Scattered subcentimeter hypodense renal cortical lesions in both kidneys are too small to characterize and require no follow-up. Small parapelvic renal cysts in the left kidney. Normal bladder. Stomach/Bowel: Normal non-distended stomach. Normal caliber small bowel with no small bowel wall thickening. Normal appendix. Oral contrast transits to the left colon. Marked colonic diverticulosis, most prominent in the sigmoid colon, with no definite large bowel wall thickening or significant pericolonic fat stranding. Vascular/Lymphatic: Atherosclerotic nonaneurysmal abdominal aorta. Patent portal, splenic, hepatic and renal veins. IVC filter in place within the infrarenal IVC. Right external iliac vein stent. Extensive retroperitoneal and bilateral pelvic lymphadenopathy involving aortocaval, left para-aortic, bilateral common iliac, bilateral external iliac and right inguinal nodal chains. Representative enlarged 1.8 cm right inguinal node (series 7/image 103), 2.0 cm right external iliac node (series 7/image 90), 2.1 cm right common iliac node (series 7/image 76), 2.0 cm posterior aortocaval node (series 7/image 64) and 1.6 cm left para-aortic node (series 7/image 62). Reproductive: Enlarged uterus. Infiltrative poorly marginated heterogeneously enhancing 7.0 x 4.3 x 5.0 cm midline pelvic mass centered in the region of the vaginal fornix with involvement of the uterine cervix and intimate association  with the anterior  rectal wall. Scattered gas in the uterine cavity. Apparent 7.5 cm anterior uterine body solid mass, nonspecific, potentially a fibroid. No adnexal masses. Other: No pneumoperitoneum, ascites or focal fluid collection. Musculoskeletal: No aggressive appearing focal osseous lesions. 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 Chronic appearing osteolytic change at L3-4. IMPRESSION: 1. Acute bilateral pulmonary embolism with large saddle embolus. Dilated main pulmonary artery suggesting pulmonary hypertension. Patient will be brought immediately to the emergency department at [facility] from the lobby of the radiology department per instructions of Dr. Denman George. 2. Infiltrative poorly marginated heterogeneously enhancing 7.0 x 4.3 x 5.0 cm midline pelvic mass centered in the region of the vaginal fornix with involvement of the uterine cervix and with intimate association with the anterior rectal wall, suspicious for malignancy of uncertain primary site. 3. Extensive lymphadenopathy throughout the mediastinum, retroperitoneum and bilateral pelvis, likely metastatic adenopathy. 4. Enlarged uterus with apparent 7.5 cm anterior uterine body solid mass, nonspecific, potentially large uterine fibroid. No adnexal masses. No ascites. 5. Cholelithiasis. Nonspecific gas in the fundal gallbladder. Consider short-term follow-up CT abdomen to exclude emphysematous cholecystitis. No biliary ductal dilatation. 6. Marked colonic diverticulosis. 7.  Aortic Atherosclerosis (ICD10-I70.0). Critical Value/emergent results were called by telephone at the time of interpretation on 09/08/2019 at 3:20 pm to provider EMMA ROSSI, MD, who verbally acknowledged these results. Electronically Signed   By: Ilona Sorrel M.D.   On: 09/08/2019 15:46   Ct Abdomen Pelvis W Contrast  Result Date: 09/08/2019 CLINICAL DATA:  Uterine and cervical mass on exam. Gyn malignancy is suspected.  Staging evaluation. History of right breast cancer. Dyspnea. EXAM: CT CHEST, ABDOMEN, AND PELVIS WITH CONTRAST TECHNIQUE: Multidetector CT imaging of the chest, abdomen and pelvis was performed following the standard protocol during bolus administration of intravenous contrast. CONTRAST:  116mL OMNIPAQUE IOHEXOL 300 MG/ML  SOLN COMPARISON:  07/14/2019 chest radiograph. FINDINGS: CT CHEST FINDINGS Cardiovascular: Normal heart size. No significant pericardial effusion/thickening. Atherosclerotic nonaneurysmal thoracic aorta. Dilated main pulmonary artery (3.5 cm diameter). There is acute bilateral pulmonary embolism including a large saddle embolus. RV/LV ratio 0.90. Mediastinum/Nodes: No discrete thyroid nodules. Unremarkable esophagus. Surgical clips in the right axilla. No pathologically enlarged axillary nodes. Right paratracheal adenopathy up to 2.0 cm short axis diameter (series 2/image 18). Enlarged 2.1 cm AP window node (series 2/image 20). Left prevascular adenopathy up to 1.1 cm (series 2/image 17). Enlarged 1.1 cm left posterior mediastinal node along the descending aorta (series 2/image 26). Bilateral retrocrural adenopathy measuring up to 1.3 cm on the right (series 2/image 46). Lungs/Pleura: No pneumothorax. No pleural effusion. No acute consolidative airspace disease, lung masses or significant pulmonary nodules. Musculoskeletal: No aggressive appearing focal osseous lesions. Marked thoracic spondylosis. CT ABDOMEN PELVIS FINDINGS Hepatobiliary: Normal liver size. Subcentimeter posterior right liver hypodense lesion is too small to characterize (series 2/image 45). No additional liver lesions. Cholelithiasis. Scattered gas within the contracted fundal gallbladder. No definite gallbladder wall thickening or pericholecystic fluid. No biliary ductal dilatation. Pancreas: Normal, with no mass or duct dilation. Spleen: Normal size. No mass. Adrenals/Urinary Tract: No discrete adrenal nodules. Mild fullness  of the central right renal collecting system without overt right hydronephrosis. No left hydronephrosis. Symmetric contrast nephrograms. Scattered subcentimeter hypodense renal cortical lesions in both kidneys are too small to characterize and require no follow-up. Small parapelvic renal cysts in the left kidney. Normal bladder. Stomach/Bowel: Normal non-distended stomach. Normal caliber small bowel with no small bowel wall thickening. Normal appendix. Oral contrast transits to  the left colon. Marked colonic diverticulosis, most prominent in the sigmoid colon, with no definite large bowel wall thickening or significant pericolonic fat stranding. Vascular/Lymphatic: Atherosclerotic nonaneurysmal abdominal aorta. Patent portal, splenic, hepatic and renal veins. IVC filter in place within the infrarenal IVC. Right external iliac vein stent. Extensive retroperitoneal and bilateral pelvic lymphadenopathy involving aortocaval, left para-aortic, bilateral common iliac, bilateral external iliac and right inguinal nodal chains. Representative enlarged 1.8 cm right inguinal node (series 7/image 103), 2.0 cm right external iliac node (series 7/image 90), 2.1 cm right common iliac node (series 7/image 76), 2.0 cm posterior aortocaval node (series 7/image 64) and 1.6 cm left para-aortic node (series 7/image 62). Reproductive: Enlarged uterus. Infiltrative poorly marginated heterogeneously enhancing 7.0 x 4.3 x 5.0 cm midline pelvic mass centered in the region of the vaginal fornix with involvement of the uterine cervix and intimate association with the anterior rectal wall. Scattered gas in the uterine cavity. Apparent 7.5 cm anterior uterine body solid mass, nonspecific, potentially a fibroid. No adnexal masses. Other: No pneumoperitoneum, ascites or focal fluid collection. Musculoskeletal: No aggressive appearing focal osseous lesions. 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 Chronic  appearing osteolytic change at L3-4. IMPRESSION: 1. Acute bilateral pulmonary embolism with large saddle embolus. Dilated main pulmonary artery suggesting pulmonary hypertension. Patient will be brought immediately to the emergency department at [facility] from the lobby of the radiology department per instructions of Dr. Denman George. 2. Infiltrative poorly marginated heterogeneously enhancing 7.0 x 4.3 x 5.0 cm midline pelvic mass centered in the region of the vaginal fornix with involvement of the uterine cervix and with intimate association with the anterior rectal wall, suspicious for malignancy of uncertain primary site. 3. Extensive lymphadenopathy throughout the mediastinum, retroperitoneum and bilateral pelvis, likely metastatic adenopathy. 4. Enlarged uterus with apparent 7.5 cm anterior uterine body solid mass, nonspecific, potentially large uterine fibroid. No adnexal masses. No ascites. 5. Cholelithiasis. Nonspecific gas in the fundal gallbladder. Consider short-term follow-up CT abdomen to exclude emphysematous cholecystitis. No biliary ductal dilatation. 6. Marked colonic diverticulosis. 7.  Aortic Atherosclerosis (ICD10-I70.0). Critical Value/emergent results were called by telephone at the time of interpretation on 09/08/2019 at 3:20 pm to provider EMMA ROSSI, MD, who verbally acknowledged these results. Electronically Signed   By: Ilona Sorrel M.D.   On: 09/08/2019 15:46   US Venous Img Lower Unilateral Right  Result Date: 08/15/2019 CLINICAL DATA:  78 year old female with a history of right leg swelling for a week EXAM: RIGHT LOWER EXTREMITY VENOUS DOPPLER ULTRASOUND TECHNIQUE: Gray-scale sonography with graded compression, as well as color Doppler and duplex ultrasound were performed to evaluate the lower extremity deep venous systems from the level of the common femoral vein and including the common femoral, femoral, profunda femoral, popliteal and calf veins including the posterior  tibial, peroneal and gastrocnemius veins when visible. The superficial great saphenous vein was also interrogated. Spectral Doppler was utilized to evaluate flow at rest and with distal augmentation maneuvers in the common femoral, femoral and popliteal veins. COMPARISON:  None. FINDINGS: Contralateral Common Femoral Vein: Respiratory phasicity is normal and symmetric with the symptomatic side. No evidence of thrombus. Normal compressibility. DVT involving the common femoral vein, femoral vein, popliteal vein, into the peroneal vein and posterior tibial vein. Thrombus extends into the saphenofemoral junction as well as into the profunda vein. Thrombus at the common femoral vein is partially occlusive. Other Findings:  None. IMPRESSION: Sonographic survey of the right lower extremity positive for acute DVT  of the common femoral vein, profunda vein, saphenofemoral junction, and through the length of the femoral vein, popliteal vein, into the tibial veins. The thrombus at the common femoral vein is partially occlusive, and uncertain to what degree thrombus extends into the pelvis. Electronically Signed   By: Corrie Mckusick D.O.   On: 08/15/2019 15:57   Dg Chest Port 1 View  Result Date: 09/11/2019 CLINICAL DATA:  Dyspnea. History of breast cancer, hypertension, hyperlipidemia, diabetes, asthma. EXAM: PORTABLE CHEST 1 VIEW COMPARISON:  Chest x-rays dated 07/14/2019 and 04/27/2017. Chest CT dated 09/08/2019. FINDINGS: Heart size and mediastinal contours are stable. Lungs are clear. No pleural effusion or pneumothorax is seen. IMPRESSION: No evidence of pneumonia or pulmonary edema. Electronically Signed   By: Franki Cabot M.D.   On: 09/11/2019 09:04   US Pelvic Complete With Transvaginal  Result Date: 08/25/2019 CLINICAL DATA:  Postmenopausal bleeding EXAM: TRANSABDOMINAL AND TRANSVAGINAL ULTRASOUND OF PELVIS TECHNIQUE: Both transabdominal and transvaginal ultrasound examinations of the pelvis were performed.  Transabdominal technique was performed for global imaging of the pelvis including uterus, ovaries, adnexal regions, and pelvic cul-de-sac. It was necessary to proceed with endovaginal exam following the transabdominal exam to visualize the ovaries. COMPARISON:  None FINDINGS: Uterus Measurements: 8.9 x 5.8 x 7.0 cm. = volume: 190 mL. A 6.9 x 7.0 cm heterogeneous mass is noted in the region of the uterine fundus. Additionally there is a area of increased echogenicity within the superior aspect of the vagina near the cervix which may represent a second separate mass lesion. Correlation with the physical exam is recommended. MRI may be helpful for further delineation. Endometrium Obscured by the underlying heterogeneous mass. Right ovary Not visualized Left ovary Not visualized Other findings No definitive free fluid is seen. IMPRESSION: Heterogeneous mass in the fundus of the uterus as well as a mass in the region of the cervix and vagina. These are felt to represent underlying neoplasm. Direct visualization is recommended as well as possible MRI of the pelvis for further delineation. Electronically Signed   By: Inez Catalina M.D.   On: 08/25/2019 11:16   Vas [place] Lower Extremity Venous (dvt)  Result Date: 09/05/2019  Lower Venous Study Indications: Edema.  Risk Factors: DVT Rt LE DVT Surgery 08/15/2019: Inferior Venocavogram; Placement of a [place] IVC Filter Infrarenal. Mechanical Thrombectomy of the Right CFV, Right External iliac Vein and Right Common Eliac vein. Comparison Study: 08/15/2019 Performing Technologist: Almira Coaster RVS  Examination Guidelines: A complete evaluation includes B-mode imaging, spectral Doppler, color Doppler, and power Doppler as needed of all accessible portions of each vessel. Bilateral testing is considered an integral part of a complete examination. Limited examinations for reoccurring indications may be performed as noted.   +---------+---------------+---------+-----------+----------+--------------+  RIGHT     Compressibility Phasicity Spontaneity Properties Thrombus Aging  +---------+---------------+---------+-----------+----------+--------------+  CFV       Full            Yes       Yes                                    +---------+---------------+---------+-----------+----------+--------------+  SFJ       Full            Yes       Yes                                    +---------+---------------+---------+-----------+----------+--------------+  FV Prox   None            No        No                                     +---------+---------------+---------+-----------+----------+--------------+  FV Mid    None            No        No                                     +---------+---------------+---------+-----------+----------+--------------+  FV Distal None            No        No                                     +---------+---------------+---------+-----------+----------+--------------+  PFV       Full            Yes       Yes                                    +---------+---------------+---------+-----------+----------+--------------+  POP       None            No        No                                     +---------+---------------+---------+-----------+----------+--------------+  PTV       Full            Yes       Yes                                    +---------+---------------+---------+-----------+----------+--------------+  PERO      Full            Yes       Yes                                    +---------+---------------+---------+-----------+----------+--------------+  GSV       Full            Yes       Yes                                    +---------+---------------+---------+-----------+----------+--------------+   +----+---------------+---------+-----------+----------+--------------+  LEFT Compressibility Phasicity Spontaneity Properties Thrombus Aging   +----+---------------+---------+-----------+----------+--------------+  CFV  Full            Yes       Yes                                    +----+---------------+---------+-----------+----------+--------------+     Summary: Right: Findings consistent with acute deep vein thrombosis involving the right femoral vein, and  right popliteal vein. Essentially Unchanged, the Right CFV appears to no evidence of DVT.  *See table(s) above for measurements and observations. Electronically signed by [name] MD on 09/05/2019 at 5:26:00 PM.    Final      Antimicrobials:   NONE   Subjective: No acute events overnight.  Resting in bed comfortably REPorted no vaginal bleeding Hemodynamically stable  Objective: Vitals:   09/11/19 0400 09/11/19 0800 09/11/19 0952 09/11/19 1000  BP:  (!) 161/76  (!) 162/64  Pulse:  72 85 94  Resp:  19 16 (!) 23  Temp: 97.9 F (36.6 C) 97.6 F (36.4 C)    TempSrc: Oral Oral    SpO2:  99% 100% 90%  Weight:      Height:        Intake/Output Summary (Last 24 hours) at 09/11/2019 1215 Last data filed at 09/11/2019 0700 Gross per 24 hour  Intake 593.64 ml  Output 1010 ml  Net -416.36 ml   Filed Weights   09/08/19 1637 09/10/19 0418  Weight: 110.2 kg 110 kg    Examination:  General exam: Appears calm and comfortable  Respiratory system: Clear to auscultation. Respiratory effort normal. Cardiovascular system: S1 & S2 heard, RRR. No JVD, murmurs, rubs, gallops or clicks. No pedal edema. Gastrointestinal system: Abdomen is nondistended, soft and nontender. No organomegaly or masses felt. Normal bowel sounds heard. Central nervous system: Alert and oriented. No focal neurological deficits. Extremities: WWP, NO CONSTRACTURES Skin: No rashes, lesions or ulcers Psychiatry: Judgement and insight impaired secondary to known cognitive deficit. Mood & affect appropriate.     Data Reviewed: I have personally reviewed following labs and imaging  studies  CBC: Recent Labs  Lab 09/08/19 1725 09/09/19 0440 09/10/19 0854 09/11/19 0744  WBC 12.0* 9.9 10.9* 9.8  NEUTROABS 10.0*  --   --   --   HGB 9.8* 8.9* 9.7* 9.9*  HCT 33.6* 30.6* 33.1* 34.6*  MCV 96.8 95.9 95.7 96.4  PLT 282 264 295 [id number]   Basic Metabolic Panel: Recent Labs  Lab 09/08/19 1725 09/09/19 0440 09/10/19 0854 09/11/19 0744  NA 140 140 139 140  K 4.0 4.1 4.4 4.7  CL 102 106 101 100  CO2 28 27 28 29   GLUCOSE 65* 71 106* 94  BUN 13 13 13 15   CREATININE 0.66 0.55 0.65 0.58  CALCIUM 8.9 8.4* 8.9 9.5   GFR: Estimated Creatinine Clearance: 68.4 mL/min (by C-G formula based on SCr of 0.58 mg/dL). Liver Function Tests: Recent Labs  Lab 09/08/19 1725 09/09/19 0440  AST 28 30  ALT 28 26  ALKPHOS 83 75  BILITOT 0.4 0.5  PROT 7.2 6.6  ALBUMIN 2.7* 2.6*   No results for input(s): LIPASE, AMYLASE in the last 168 hours. No results for input(s): AMMONIA in the last 168 hours. Coagulation Profile: Recent Labs  Lab 09/08/19 1725  INR 1.4*   Cardiac Enzymes: No results for input(s): CKTOTAL, CKMB, CKMBINDEX, TROPONINI in the last 168 hours. BNP (last 3 results) No results for input(s): PROBNP in the last 8760 hours. HbA1C: No results for input(s): HGBA1C in the last 72 hours. CBG: Recent Labs  Lab 09/10/19 1158 09/10/19 1832 09/10/19 2111 09/11/19 0808 09/11/19 1205  GLUCAP 172* 134* 130* 83 115*   Lipid Profile: No results for input(s): CHOL, HDL, LDLCALC, TRIG, CHOLHDL, LDLDIRECT in the last 72 hours. Thyroid Function Tests: No results for input(s): TSH, T4TOTAL, FREET4, T3FREE, THYROIDAB in the last 72 hours. Anemia Panel: Recent Labs  09/10/19 0854  FERRITIN 144  TIBC 250  IRON 18*   Sepsis Labs: No results for input(s): PROCALCITON, LATICACIDVEN in the last 168 hours.  Recent Results (from the past 240 hour(s))  SARS CORONAVIRUS 2 (TAT 6-24 HRS) Nasopharyngeal Nasopharyngeal Swab     Status: None   Collection Time: 09/08/19   7:47 PM   Specimen: Nasopharyngeal Swab  Result Value Ref Range Status   SARS Coronavirus 2 NEGATIVE NEGATIVE Final    Comment: (NOTE) SARS-CoV-2 target nucleic acids are NOT DETECTED. The SARS-CoV-2 RNA is generally detectable in upper and lower respiratory specimens during the acute phase of infection. Negative results do not preclude SARS-CoV-2 infection, do not rule out co-infections with other pathogens, and should not be used as the sole basis for treatment or other patient management decisions. Negative results must be combined with clinical observations, patient history, and epidemiological information. The expected result is Negative. Fact Sheet for Patients: SugarRoll.be Fact Sheet for Healthcare Providers: https://www.woods-mathews.com/ This test is not yet approved or cleared by the [place] FDA and  has been authorized for detection and/or diagnosis of SARS-CoV-2 by FDA under an Emergency Use Authorization (EUA). This EUA will remain  in effect (meaning this test can be used) for the duration of the COVID-19 declaration under Section 56 4(b)(1) of the Act, 21 U.S.C. section 360bbb-3(b)(1), unless the authorization is terminated or revoked sooner. Performed at [facility], [place] [address] [place], [place] [address]   MRSA PCR Screening     Status: None   Collection Time: 09/09/19 12:55 PM   Specimen: Nasal Mucosa; Nasopharyngeal  Result Value Ref Range Status   MRSA by PCR NEGATIVE NEGATIVE Final    Comment:        The GeneXpert MRSA Assay (FDA approved for NASAL specimens only), is one component of a comprehensive MRSA colonization surveillance program. It is not intended to diagnose MRSA infection nor to guide or monitor treatment for MRSA infections. Performed at [facility], [name] [address], [place],  [address]          Radiology Studies: Dg Abd 1 View  Result Date:  09/11/2019 CLINICAL DATA:  Constipation.  Abdominal pain. EXAM: ABDOMEN - 1 VIEW COMPARISON:  None. FINDINGS: Normal bowel gas pattern.  No significant increase in colonic stool. 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 There is a right common iliac artery stent. Soft tissues otherwise unremarkable. No acute skeletal abnormality. IMPRESSION: 1. No acute findings.  No evidence of bowel obstruction. 2. No radiographic evidence of increased colonic stool. Electronically Signed   By: Lajean Manes M.D.   On: 09/11/2019 10:43   Dg Chest Port 1 View  Result Date: 09/11/2019 CLINICAL DATA:  Dyspnea. History of breast cancer, hypertension, hyperlipidemia, diabetes, asthma. EXAM: PORTABLE CHEST 1 VIEW COMPARISON:  Chest x-rays dated 07/14/2019 and 04/27/2017. Chest CT dated 09/08/2019. FINDINGS: Heart size and mediastinal contours are stable. Lungs are clear. No pleural effusion or pneumothorax is seen. IMPRESSION: No evidence of pneumonia or pulmonary edema. Electronically Signed   By: Franki Cabot M.D.   On: 09/11/2019 09:04        Scheduled Meds:  acidophilus   Oral Daily   amLODipine  5 mg Oral Daily   Chlorhexidine Gluconate Cloth  6 each Topical Daily   cholecalciferol  5,000 Units Oral Daily   enoxaparin (LOVENOX) injection  110 mg Subcutaneous Q12H   ferrous sulfate  325 mg Oral Q breakfast  insulin aspart  0-5 Units Subcutaneous QHS   insulin aspart  0-9 Units Subcutaneous TID WC   lisinopril  20 mg Oral Daily   loratadine  10 mg Oral Daily   mouth rinse  15 mL Mouth Rinse BID   memantine  10 mg Oral BID   nitrofurantoin  100 mg Oral BID   rosuvastatin  10 mg Oral q1800   sertraline  100 mg Oral Daily   Continuous Infusions:   LOS: 3 days    Time spent: 43 min    Nicolette Bang, MD [facility]  If 7PM-7AM, please contact night-coverage  09/11/2019, 12:15 PM

## 2019-09-12 ENCOUNTER — Telehealth: Payer: Self-pay | Admitting: Oncology

## 2019-09-12 LAB — BASIC METABOLIC PANEL
Anion gap: 11 (ref 5–15)
BUN: 21 mg/dL (ref 8–23)
CO2: 25 mmol/L (ref 22–32)
Calcium: 9.3 mg/dL (ref 8.9–10.3)
Chloride: 103 mmol/L (ref 98–111)
Creatinine, Ser: 0.7 mg/dL (ref 0.44–1.00)
GFR calc Af Amer: >60 mL/min (ref >60)
GFR calc non Af Amer: >60 mL/min (ref >60)
Glucose, Bld: 104 mg/dL — ABNORMAL HIGH (ref 70–99)
Potassium: 4.1 mmol/L (ref 3.5–5.1)
Sodium: 139 mmol/L (ref 135–145)

## 2019-09-12 LAB — CBC
HCT: 33.8 % — ABNORMAL LOW (ref 36.0–46.0)
Hemoglobin: 9.7 g/dL — ABNORMAL LOW (ref 12.0–15.0)
MCH: 27.8 pg (ref 26.0–34.0)
MCHC: 28.7 g/dL — ABNORMAL LOW (ref 30.0–36.0)
MCV: 96.8 fL (ref 80.0–100.0)
Platelets: 329 10*3/uL (ref 150–400)
RBC: 3.49 MIL/uL — ABNORMAL LOW (ref 3.87–5.11)
RDW: 15.1 % (ref 11.5–15.5)
WBC: 9.2 10*3/uL (ref 4.0–10.5)
nRBC: 0 % (ref 0.0–0.2)

## 2019-09-12 LAB — GLUCOSE, CAPILLARY
Glucose-Capillary: 95 mg/dL (ref 70–99)
Glucose-Capillary: 124 mg/dL — ABNORMAL HIGH (ref 70–99)

## 2019-09-12 MED ORDER — IBUPROFEN 200 MG PO TABS: 400.0000 mg | ORAL_TABLET | Freq: Four times a day (QID) | ORAL | Status: DC | PRN

## 2019-09-12 MED ORDER — ENOXAPARIN SODIUM 120 MG/0.8ML ~~LOC~~ SOLN: 110.0000 mg | Freq: Two times a day (BID) | SUBCUTANEOUS | 0 refills | Status: DC

## 2019-09-12 MED ORDER — AMLODIPINE BESYLATE 5 MG PO TABS: 5.0000 mg | ORAL_TABLET | Freq: Every day | ORAL | 0 refills | Status: DC

## 2019-09-12 NOTE — Progress Notes (Signed)
Discharge instructions went over with pt sister [name]. Pt sister advised that RN needs to teach someone in family how to give pt lovenox injection. Pt sister stated "we have a nurse to administer those."

## 2019-09-12 NOTE — Progress Notes (Signed)
Patient ID: Tiffany Arnold, female   DOB: 08-25-1941, 78 y.o.   MRN: [id number] Request received over the weekend for possible PE lysis on patient.  Case has been reviewed by Dr. Laurence Ferrari and discussed with Dr. Wyonia Hough.  Patient currently hemodynamically stable and has no evidence of right heart strain therefore she is not a candidate for PE lysis.

## 2019-09-12 NOTE — Discharge Summary (Signed)
Physician Discharge Summary  Tiffany Arnold [id number] DOB: 1941-04-04 DOA: 09/08/2019  PCP: [name], NP  Admit date: 09/08/2019 Discharge date: 09/12/2019  Admitted From: Inpatient Disposition: home  Recommendations for Outpatient Follow-up:  1. 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 New pelvic mass, apparently had a CT scan to w/up her new pelvic mass and pulmonary embolism was seen. Pt sent to ED for evaluation  Hospital course PE patient was placed on a heparin drip and converted to Lovenox after discussion with hematology oncology.  IR reviewed CT as well as echo was no indication for clot lysis.  Patient is stable on room air will be discharged home with outpatient follow-up with her oncologist..  She will continue on Lovenox 120 mg subcu twice daily New pelvic mass as noted patient's been seen by, gynonc as well as heme-onc.  She will continue to follow-up outpatient with them as an outpatient  While in the the hospital continued patient's home medications for chronic diabetes, hypertension, memory disorder, and anxiety.  We added an additional blood pressure medication otherwise no changes in patient's home medications.  Discharge Diagnoses:  Principal Problem:   Pulmonary embolism (HCC) Active Problems:   Essential hypertension   HLD (hyperlipidemia)   Type 2 diabetes mellitus without complication (HCC)   OSA on CPAP    Memory disorder    Discharge Instructions  Discharge Instructions    Call MD for:   Complete by: As directed    Any acute change in medical condition to include but not limited to bleeding nausea vomiting chest pain dizziness   Diet - low sodium heart healthy   Complete by: As directed    Increase activity slowly   Complete by: As directed      Allergies as of 09/12/2019      Reactions   Other Shortness Of Breath, Swelling, Other (See Comments)   Cats, dogs, mold Induced asthma Cats, dogs, mold      Medication List    STOP taking these medications   apixaban 5 MG Tabs tablet Commonly known as: ELIQUIS     TAKE these medications   amLODipine 5 MG tablet Commonly known as: NORVASC Take 1 tablet (5 mg total) by mouth daily. Start taking on: September 13, 2019   cetirizine 10 MG tablet Commonly known as: ZYRTEC Take 10 mg by mouth every evening.   enoxaparin 120 MG/0.8ML injection Commonly known as: LOVENOX Inject 0.73 mLs (110 mg total) into the skin every 12 (twelve) hours.   exemestane 25 MG tablet Commonly known as: AROMASIN Take 1 tablet (25 mg total) by mouth daily.   ferrous sulfate 325 (65 FE) MG tablet Take 325 mg by mouth daily with breakfast.   glipiZIDE 5 MG tablet Commonly known as: GLUCOTROL Take 1 tablet (5 mg total) by mouth daily before breakfast.   lisinopril 20 MG tablet Commonly known as: ZESTRIL Take 1 tablet (20 mg total) by mouth daily.   memantine 10 MG tablet Commonly known as: NAMENDA TAKE ONE TABLET BY MOUTH  TWICE DAILY   nitrofurantoin 100 MG capsule Commonly known as: MACRODANTIN Take 100 mg by mouth 2 (two) times daily.   PROBIOTIC PO Take 1 capsule by mouth daily.   rosuvastatin 10 MG tablet Commonly known as: Crestor Take 1 tablet (10 mg total) by mouth daily.   sertraline 100 MG tablet Commonly known as: ZOLOFT Take 1 tablet (100 mg total) by mouth daily.   Vitamin D-3 125 MCG (5000 UT) Tabs Take 5,000 Units  by mouth daily.       Allergies  Allergen Reactions  . Other Shortness Of Breath, Swelling and Other (See Comments)    Cats, dogs, mold Induced asthma Cats, dogs, mold    Consultations:  hemonc   Procedures/Studies: Dg Abd 1 View  Result Date: 09/11/2019 CLINICAL DATA:  Constipation.  Abdominal pain. EXAM: ABDOMEN - 1 VIEW COMPARISON:  None. FINDINGS: Normal bowel gas pattern.  No significant increase in colonic stool. 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 There is a right common iliac artery stent. Soft tissues otherwise unremarkable. No acute skeletal abnormality. IMPRESSION: 1. No acute findings.  No evidence of bowel obstruction. 2. No radiographic evidence of increased colonic stool. Electronically Signed   By: Lajean Manes M.D.   On: 09/11/2019 10:43   Ct Head Wo Contrast  Result Date: 09/08/2019 CLINICAL DATA:  [place] in bathroom recent CT with positive PE EXAM: CT HEAD WITHOUT CONTRAST TECHNIQUE: Contiguous axial images were obtained from the base of the skull through the vertex without intravenous contrast. COMPARISON:  CT 09/08/2019, 04/13/2019 FINDINGS: Brain: No acute territorial infarction, hemorrhage or intracranial mass. Atrophy and moderate hypodensity in the white matter consistent with small vessel ischemic change. Stable ventricle size Vascular: No hyperdense vessels.  Carotid vascular calcification Skull: Normal. Negative for fracture or focal lesion. Sinuses/Orbits: Postsurgical changes of the maxillary and ethmoid sinuses with mild mucosal thickening. Other: Small left forehead soft tissue swelling IMPRESSION: 1. No CT evidence for acute intracranial abnormality. 2. Atrophy and small vessel ischemic changes of white matter. Electronically Signed   By: Donavan Foil M.D.   On: 09/08/2019 19:11   Ct Chest W Contrast  Result Date: 09/08/2019 CLINICAL DATA:  Uterine and cervical mass on exam. Gyn malignancy is suspected. Staging evaluation. History of  right breast cancer. Dyspnea. EXAM: CT CHEST, ABDOMEN, AND PELVIS WITH CONTRAST TECHNIQUE: Multidetector CT imaging of the chest, abdomen and pelvis was performed following the standard protocol during bolus administration of intravenous contrast. CONTRAST:  151mL OMNIPAQUE IOHEXOL 300 MG/ML  SOLN COMPARISON:  07/14/2019 chest radiograph. FINDINGS: CT CHEST FINDINGS Cardiovascular: Normal heart size. No significant pericardial effusion/thickening. Atherosclerotic nonaneurysmal thoracic aorta. Dilated main pulmonary artery (3.5 cm diameter). There is acute bilateral pulmonary embolism including a large saddle embolus. RV/LV ratio 0.90. Mediastinum/Nodes: No discrete thyroid nodules. Unremarkable esophagus. Surgical clips in the right axilla. No pathologically enlarged axillary nodes. Right paratracheal adenopathy up to 2.0 cm short axis diameter (series 2/image 18). Enlarged 2.1 cm AP window node (series 2/image 20). Left prevascular adenopathy up to 1.1 cm (series 2/image 17). Enlarged 1.1 cm left posterior mediastinal node along the descending aorta (series 2/image 26). Bilateral retrocrural adenopathy measuring up to 1.3 cm on the right (series 2/image 46). Lungs/Pleura: No pneumothorax. No pleural effusion. No acute consolidative airspace disease, lung masses or significant pulmonary nodules. Musculoskeletal: No aggressive appearing focal osseous lesions. Marked thoracic spondylosis. CT ABDOMEN PELVIS FINDINGS Hepatobiliary: Normal liver size. Subcentimeter posterior right liver hypodense lesion is too  small to characterize (series 2/image 45). No additional liver lesions. Cholelithiasis. Scattered gas within the contracted fundal gallbladder. No definite gallbladder wall thickening or pericholecystic fluid. No biliary ductal dilatation. Pancreas: Normal, with no mass or duct dilation. Spleen: Normal size. No mass. Adrenals/Urinary Tract: No discrete adrenal nodules. Mild fullness of the central right renal  collecting system without overt right hydronephrosis. No left hydronephrosis. Symmetric contrast nephrograms. Scattered subcentimeter hypodense renal cortical lesions in both kidneys are too small to characterize and require no follow-up. Small parapelvic renal cysts in the left kidney. Normal bladder. Stomach/Bowel: Normal non-distended stomach. Normal caliber small bowel with no small bowel wall thickening. Normal appendix. Oral contrast transits to the left colon. Marked colonic diverticulosis, most prominent in the sigmoid colon, with no definite large bowel wall thickening or significant pericolonic fat stranding. Vascular/Lymphatic: Atherosclerotic nonaneurysmal abdominal aorta. Patent portal, splenic, hepatic and renal veins. IVC filter in place within the infrarenal IVC. Right external iliac vein stent. Extensive retroperitoneal and bilateral pelvic lymphadenopathy involving aortocaval, left para-aortic, bilateral common iliac, bilateral external iliac and right inguinal nodal chains. Representative enlarged 1.8 cm right inguinal node (series 7/image 103), 2.0 cm right external iliac node (series 7/image 90), 2.1 cm right common iliac node (series 7/image 76), 2.0 cm posterior aortocaval node (series 7/image 64) and 1.6 cm left para-aortic node (series 7/image 62). Reproductive: Enlarged uterus. Infiltrative poorly marginated heterogeneously enhancing 7.0 x 4.3 x 5.0 cm midline pelvic mass centered in the region of the vaginal fornix with involvement of the uterine cervix and intimate association with the anterior rectal wall. Scattered gas in the uterine cavity. Apparent 7.5 cm anterior uterine body solid mass, nonspecific, potentially a fibroid. No adnexal masses. Other: No pneumoperitoneum, ascites or focal fluid collection. Musculoskeletal: No aggressive appearing focal osseous lesions. 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 Chronic appearing osteolytic change at  L3-4. IMPRESSION: 1. Acute bilateral pulmonary embolism with large saddle embolus. Dilated main pulmonary artery suggesting pulmonary hypertension. Patient will be brought immediately to the emergency department at [facility] from the lobby of the radiology department per instructions of Dr. Denman George. 2. Infiltrative poorly marginated heterogeneously enhancing 7.0 x 4.3 x 5.0 cm midline pelvic mass centered in the region of the vaginal fornix with involvement of the uterine cervix and with intimate association with the anterior rectal wall, suspicious for malignancy of uncertain primary site. 3. Extensive lymphadenopathy throughout the mediastinum, retroperitoneum and bilateral pelvis, likely metastatic adenopathy. 4. Enlarged uterus with apparent 7.5 cm anterior uterine body solid mass, nonspecific, potentially large uterine fibroid. No adnexal masses. No ascites. 5. Cholelithiasis. Nonspecific gas in the fundal gallbladder. Consider short-term follow-up CT abdomen to exclude emphysematous cholecystitis. No biliary ductal dilatation. 6. Marked colonic diverticulosis. 7.  Aortic Atherosclerosis (ICD10-I70.0). Critical Value/emergent results were called by telephone at the time of interpretation on 09/08/2019 at 3:20 pm to provider EMMA ROSSI, MD, who verbally acknowledged these results. Electronically Signed   By: Ilona Sorrel M.D.   On: 09/08/2019 15:46   Ct Abdomen Pelvis W Contrast  Result Date: 09/08/2019 CLINICAL DATA:  Uterine and cervical mass on exam. Gyn malignancy is suspected. Staging evaluation. History of right breast cancer. Dyspnea. EXAM: CT CHEST, ABDOMEN, AND PELVIS WITH CONTRAST TECHNIQUE: Multidetector CT imaging of the chest, abdomen and pelvis was performed following the standard protocol during bolus administration of intravenous contrast. CONTRAST:  143mL OMNIPAQUE IOHEXOL 300 MG/ML  SOLN COMPARISON:  07/14/2019 chest radiograph. FINDINGS: CT CHEST FINDINGS Cardiovascular: Normal  heart size. No significant pericardial effusion/thickening. Atherosclerotic nonaneurysmal thoracic aorta. Dilated main pulmonary artery (3.5 cm diameter). There is acute bilateral pulmonary embolism including a large saddle embolus. RV/LV ratio 0.90. Mediastinum/Nodes: No discrete thyroid nodules. Unremarkable esophagus. Surgical clips in the right axilla. No pathologically enlarged axillary nodes. Right paratracheal adenopathy up to 2.0 cm short axis diameter (series 2/image 18). Enlarged 2.1 cm AP window node (series 2/image 20). Left prevascular adenopathy up to 1.1 cm (series 2/image 17). Enlarged 1.1 cm left posterior mediastinal node along the descending aorta (series 2/image 26). Bilateral retrocrural adenopathy measuring up to 1.3 cm on the right (series 2/image 46). Lungs/Pleura: No pneumothorax. No pleural effusion. No acute consolidative airspace disease, lung masses or significant pulmonary nodules. Musculoskeletal: No aggressive appearing focal osseous lesions. Marked thoracic spondylosis. CT ABDOMEN PELVIS FINDINGS Hepatobiliary: Normal liver size. Subcentimeter posterior right liver hypodense lesion is too small to characterize (series 2/image 45). No additional liver lesions. Cholelithiasis. Scattered gas within the contracted fundal gallbladder. No definite gallbladder wall thickening or pericholecystic fluid. No biliary ductal dilatation. Pancreas: Normal, with no mass or duct dilation. Spleen: Normal size. No mass. Adrenals/Urinary Tract: No discrete adrenal nodules. Mild fullness of the central right renal collecting system without overt right hydronephrosis. No left hydronephrosis. Symmetric contrast nephrograms. Scattered subcentimeter hypodense renal cortical lesions in both kidneys are too small to characterize and require no follow-up. Small parapelvic renal cysts in the left kidney. Normal bladder. Stomach/Bowel: Normal non-distended stomach. Normal caliber small bowel with no small bowel  wall thickening. Normal appendix. Oral contrast transits to the left colon. Marked colonic diverticulosis, most prominent in the sigmoid colon, with no definite large bowel wall thickening or significant pericolonic fat stranding. Vascular/Lymphatic: Atherosclerotic nonaneurysmal abdominal aorta. Patent portal, splenic, hepatic and renal veins. IVC filter in place within the infrarenal IVC. Right external iliac vein stent. Extensive retroperitoneal and bilateral pelvic lymphadenopathy involving aortocaval, left para-aortic, bilateral common iliac, bilateral external iliac and right inguinal nodal chains. Representative enlarged 1.8 cm right inguinal node (series 7/image 103), 2.0 cm right external iliac node (series 7/image 90), 2.1 cm right common iliac node (series 7/image 76), 2.0 cm posterior aortocaval node (series 7/image 64) and 1.6 cm left para-aortic node (series 7/image 62). Reproductive: Enlarged uterus. Infiltrative poorly marginated heterogeneously enhancing 7.0 x 4.3 x 5.0 cm midline pelvic mass centered in the region of the vaginal fornix with involvement of the uterine cervix and intimate association with the anterior rectal wall. Scattered gas in the uterine cavity. Apparent 7.5 cm anterior uterine body solid mass, nonspecific, potentially a fibroid. No adnexal masses. Other: No pneumoperitoneum, ascites or focal fluid collection. Musculoskeletal: No aggressive appearing focal osseous lesions. 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 Chronic appearing osteolytic change at L3-4. IMPRESSION: 1. Acute bilateral pulmonary embolism with large saddle embolus. Dilated main pulmonary artery suggesting pulmonary hypertension. Patient will be brought immediately to the emergency department at [facility] from the lobby of the radiology department per instructions of Dr. Denman George. 2. Infiltrative poorly marginated heterogeneously enhancing 7.0 x 4.3 x 5.0 cm  midline pelvic mass centered in the region of the vaginal fornix with involvement of the uterine cervix and with intimate association with the anterior rectal wall, suspicious for malignancy of uncertain primary site. 3. Extensive lymphadenopathy throughout the mediastinum, retroperitoneum and bilateral pelvis, likely metastatic adenopathy. 4. Enlarged uterus with apparent 7.5 cm anterior uterine body solid mass, nonspecific, potentially large uterine fibroid. No adnexal masses. No ascites. 5.  Cholelithiasis. Nonspecific gas in the fundal gallbladder. Consider short-term follow-up CT abdomen to exclude emphysematous cholecystitis. No biliary ductal dilatation. 6. Marked colonic diverticulosis. 7.  Aortic Atherosclerosis (ICD10-I70.0). Critical Value/emergent results were called by telephone at the time of interpretation on 09/08/2019 at 3:20 pm to provider EMMA ROSSI, MD, who verbally acknowledged these results. Electronically Signed   By: Ilona Sorrel M.D.   On: 09/08/2019 15:46   US Venous Img Lower Unilateral Right  Result Date: 08/15/2019 CLINICAL DATA:  78 year old female with a history of right leg swelling for a week EXAM: RIGHT LOWER EXTREMITY VENOUS DOPPLER ULTRASOUND TECHNIQUE: Gray-scale sonography with graded compression, as well as color Doppler and duplex ultrasound were performed to evaluate the lower extremity deep venous systems from the level of the common femoral vein and including the common femoral, femoral, profunda femoral, popliteal and calf veins including the posterior tibial, peroneal and gastrocnemius veins when visible. The superficial great saphenous vein was also interrogated. Spectral Doppler was utilized to evaluate flow at rest and with distal augmentation maneuvers in the common femoral, femoral and popliteal veins. COMPARISON:  None. FINDINGS: Contralateral Common Femoral Vein: Respiratory phasicity is normal and symmetric with the symptomatic side. No evidence of thrombus.  Normal compressibility. DVT involving the common femoral vein, femoral vein, popliteal vein, into the peroneal vein and posterior tibial vein. Thrombus extends into the saphenofemoral junction as well as into the profunda vein. Thrombus at the common femoral vein is partially occlusive. Other Findings:  None. IMPRESSION: Sonographic survey of the right lower extremity positive for acute DVT of the common femoral vein, profunda vein, saphenofemoral junction, and through the length of the femoral vein, popliteal vein, into the tibial veins. The thrombus at the common femoral vein is partially occlusive, and uncertain to what degree thrombus extends into the pelvis. Electronically Signed   By: Corrie Mckusick D.O.   On: 08/15/2019 15:57   Dg Chest Port 1 View  Result Date: 09/11/2019 CLINICAL DATA:  Dyspnea. History of breast cancer, hypertension, hyperlipidemia, diabetes, asthma. EXAM: PORTABLE CHEST 1 VIEW COMPARISON:  Chest x-rays dated 07/14/2019 and 04/27/2017. Chest CT dated 09/08/2019. FINDINGS: Heart size and mediastinal contours are stable. Lungs are clear. No pleural effusion or pneumothorax is seen. IMPRESSION: No evidence of pneumonia or pulmonary edema. Electronically Signed   By: Franki Cabot M.D.   On: 09/11/2019 09:04   US Pelvic Complete With Transvaginal  Result Date: 08/25/2019 CLINICAL DATA:  Postmenopausal bleeding EXAM: TRANSABDOMINAL AND TRANSVAGINAL ULTRASOUND OF PELVIS TECHNIQUE: Both transabdominal and transvaginal ultrasound examinations of the pelvis were performed. Transabdominal technique was performed for global imaging of the pelvis including uterus, ovaries, adnexal regions, and pelvic cul-de-sac. It was necessary to proceed with endovaginal exam following the transabdominal exam to visualize the ovaries. COMPARISON:  None FINDINGS: Uterus Measurements: 8.9 x 5.8 x 7.0 cm. = volume: 190 mL. A 6.9 x 7.0 cm heterogeneous mass is noted in the region of the uterine fundus.  Additionally there is a area of increased echogenicity within the superior aspect of the vagina near the cervix which may represent a second separate mass lesion. Correlation with the physical exam is recommended. MRI may be helpful for further delineation. Endometrium Obscured by the underlying heterogeneous mass. Right ovary Not visualized Left ovary Not visualized Other findings No definitive free fluid is seen. IMPRESSION: Heterogeneous mass in the fundus of the uterus as well as a mass in the region of the cervix and vagina. These are felt to represent underlying neoplasm.  Direct visualization is recommended as well as possible MRI of the pelvis for further delineation. Electronically Signed   By: Inez Catalina M.D.   On: 08/25/2019 11:16   Vas [place] Lower Extremity Venous (dvt)  Result Date: 09/05/2019  Lower Venous Study Indications: Edema.  Risk Factors: DVT Rt LE DVT Surgery 08/15/2019: Inferior Venocavogram; Placement of a [place] IVC Filter Infrarenal. Mechanical Thrombectomy of the Right CFV, Right External iliac Vein and Right Common Eliac vein. Comparison Study: 08/15/2019 Performing Technologist: Almira Coaster RVS  Examination Guidelines: A complete evaluation includes B-mode imaging, spectral Doppler, color Doppler, and power Doppler as needed of all accessible portions of each vessel. Bilateral testing is considered an integral part of a complete examination. Limited examinations for reoccurring indications may be performed as noted.  +---------+---------------+---------+-----------+----------+--------------+ RIGHT    CompressibilityPhasicitySpontaneityPropertiesThrombus Aging +---------+---------------+---------+-----------+----------+--------------+ CFV      Full           Yes      Yes                                 +---------+---------------+---------+-----------+----------+--------------+ SFJ      Full           Yes      Yes                                  +---------+---------------+---------+-----------+----------+--------------+ FV Prox  None           No       No                                  +---------+---------------+---------+-----------+----------+--------------+ FV Mid   None           No       No                                  +---------+---------------+---------+-----------+----------+--------------+ FV DistalNone           No       No                                  +---------+---------------+---------+-----------+----------+--------------+ PFV      Full           Yes      Yes                                 +---------+---------------+---------+-----------+----------+--------------+ POP      None           No       No                                  +---------+---------------+---------+-----------+----------+--------------+ PTV      Full           Yes      Yes                                 +---------+---------------+---------+-----------+----------+--------------+ PERO  Full           Yes      Yes                                 +---------+---------------+---------+-----------+----------+--------------+ GSV      Full           Yes      Yes                                 +---------+---------------+---------+-----------+----------+--------------+   +----+---------------+---------+-----------+----------+--------------+ LEFTCompressibilityPhasicitySpontaneityPropertiesThrombus Aging +----+---------------+---------+-----------+----------+--------------+ CFV Full           Yes      Yes                                 +----+---------------+---------+-----------+----------+--------------+     Summary: Right: Findings consistent with acute deep vein thrombosis involving the right femoral vein, and right popliteal vein. Essentially Unchanged, the Right CFV appears to no evidence of DVT.  *See table(s) above for measurements and observations. Electronically signed by [name] MD  on 09/05/2019 at 5:26:00 PM.    Final        Subjective: Patient reports she feels fine, she is at baseline Vaginal bleeding, no pain On room air without respiratory compromise  Discharge Exam: Vitals:   09/12/19 0901 09/12/19 1324  BP: (!) 145/75 (!) 169/84  Pulse: 99 96  Resp: 18 16  Temp: 98.3 F (36.8 C) 98.4 F (36.9 C)  SpO2: 94% 93%   Vitals:   09/11/19 2124 09/12/19 0438 09/12/19 0901 09/12/19 1324  BP: (!) 141/78 (!) 144/70 (!) 145/75 (!) 169/84  Pulse: 88 88 99 96  Resp: 18 18 18 16   Temp: 98.3 F (36.8 C) 97.8 F (36.6 C) 98.3 F (36.8 C) 98.4 F (36.9 C)  TempSrc: Oral Oral Oral Oral  SpO2: 95% 96% 94% 93%  Weight:  110 kg    Height:        General: Pt is alert, awake, not in acute distress Cardiovascular: RRR, S1/S2 +, no rubs, no gallops Respiratory: CTA bilaterally, no wheezing, no rhonchi Abdominal: Soft, NT, ND, bowel sounds + Extremities: no edema, no cyanosis    The results of significant diagnostics from this hospitalization (including imaging, microbiology, ancillary and laboratory) are listed below for reference.     Microbiology: Recent Results (from the past 240 hour(s))  SARS CORONAVIRUS 2 (TAT 6-24 HRS) Nasopharyngeal Nasopharyngeal Swab     Status: None   Collection Time: 09/08/19  7:47 PM   Specimen: Nasopharyngeal Swab  Result Value Ref Range Status   SARS Coronavirus 2 NEGATIVE NEGATIVE Final    Comment: (NOTE) SARS-CoV-2 target nucleic acids are NOT DETECTED. The SARS-CoV-2 RNA is generally detectable in upper and lower respiratory specimens during the acute phase of infection. Negative results do not preclude SARS-CoV-2 infection, do not rule out co-infections with other pathogens, and should not be used as the sole basis for treatment or other patient management decisions. Negative results must be combined with clinical observations, patient history, and epidemiological information. The expected result is Negative. Fact  Sheet for Patients: SugarRoll.be Fact Sheet for Healthcare Providers: https://www.woods-mathews.com/ This test is not yet approved or cleared by the [place] FDA and  has been authorized for detection and/or diagnosis of SARS-CoV-2 by FDA under  an Emergency Use Authorization (EUA). This EUA will remain  in effect (meaning this test can be used) for the duration of the COVID-19 declaration under Section 56 4(b)(1) of the Act, 21 U.S.C. section 360bbb-3(b)(1), unless the authorization is terminated or revoked sooner. Performed at [facility], [place] [address], [place], [place] [address]   MRSA PCR Screening     Status: None   Collection Time: 09/09/19 12:55 PM   Specimen: Nasal Mucosa; Nasopharyngeal  Result Value Ref Range Status   MRSA by PCR NEGATIVE NEGATIVE Final    Comment:        The GeneXpert MRSA Assay (FDA approved for NASAL specimens only), is one component of a comprehensive MRSA colonization surveillance program. It is not intended to diagnose MRSA infection nor to guide or monitor treatment for MRSA infections. Performed at [facility], [place] [address] [place], [place] [address]      Labs: BNP (last 3 results) No results for input(s): BNP in the last 8760 hours. Basic Metabolic Panel: Recent Labs  Lab 09/08/19 1725 09/09/19 0440 09/10/19 0854 09/11/19 0744 09/12/19 0335  NA 140 140 139 140 139  K 4.0 4.1 4.4 4.7 4.1  CL 102 106 101 100 103  CO2 28 27 28 29 25   GLUCOSE 65* 71 106* 94 104*  BUN 13 13 13 15 21   CREATININE 0.66 0.55 0.65 0.58 0.70  CALCIUM 8.9 8.4* 8.9 9.5 9.3   Liver Function Tests: Recent Labs  Lab 09/08/19 1725 09/09/19 0440  AST 28 30  ALT 28 26  ALKPHOS 83 75  BILITOT 0.4 0.5  PROT 7.2 6.6  ALBUMIN 2.7* 2.6*   No results for input(s): LIPASE, AMYLASE in the last 168 hours. No results for input(s): AMMONIA in the last 168 hours. CBC: Recent Labs  Lab  09/08/19 1725 09/09/19 0440 09/10/19 0854 09/11/19 0744 09/12/19 0335  WBC 12.0* 9.9 10.9* 9.8 9.2  NEUTROABS 10.0*  --   --   --   --   HGB 9.8* 8.9* 9.7* 9.9* 9.7*  HCT 33.6* 30.6* 33.1* 34.6* 33.8*  MCV 96.8 95.9 95.7 96.4 96.8  PLT 282 264 295 325 329   Cardiac Enzymes: No results for input(s): CKTOTAL, CKMB, CKMBINDEX, TROPONINI in the last 168 hours. BNP: Invalid input(s): POCBNP CBG: Recent Labs  Lab 09/11/19 1205 09/11/19 1641 09/11/19 2145 09/12/19 0728 09/12/19 1112  GLUCAP 115* 144* 113* 95 124*   D-Dimer No results for input(s): DDIMER in the last 72 hours. Hgb A1c No results for input(s): HGBA1C in the last 72 hours. Lipid Profile No results for input(s): CHOL, HDL, LDLCALC, TRIG, CHOLHDL, LDLDIRECT in the last 72 hours. Thyroid function studies No results for input(s): TSH, T4TOTAL, T3FREE, THYROIDAB in the last 72 hours.  Invalid input(s): FREET3 Anemia work up Recent Labs    09/10/19 0854  FERRITIN 144  TIBC 250  IRON 18*   Urinalysis    Component Value Date/Time   COLORURINE YELLOW (A) 07/06/2017 1318   APPEARANCEUR HAZY (A) 07/06/2017 1318   APPEARANCEUR Turbid 05/15/2012 1657   LABSPEC 1.020 07/06/2017 1318   LABSPEC 1.023 05/15/2012 1657   PHURINE 5.0 07/06/2017 1318   GLUCOSEU NEGATIVE 07/06/2017 1318   GLUCOSEU Negative 05/15/2012 1657   HGBUR SMALL (A) 07/06/2017 1318   BILIRUBINUR neg 07/14/2019 1612   BILIRUBINUR Negative 05/15/2012 [place] 07/06/2017 1318   PROTEINUR Positive (A) 07/14/2019 1612   PROTEINUR 30 (A) 07/06/2017 1318   UROBILINOGEN  1.0 07/14/2019 1612   NITRITE negative 07/14/2019 1612   NITRITE POSITIVE (A) 07/06/2017 1318   LEUKOCYTESUR Small (1+) (A) 07/14/2019 1612   LEUKOCYTESUR 2+ 05/15/2012 1657   Sepsis Labs Invalid input(s): PROCALCITONIN,  WBC,  LACTICIDVEN Microbiology Recent Results (from the past 240 hour(s))  SARS CORONAVIRUS 2 (TAT 6-24 HRS) Nasopharyngeal Nasopharyngeal  Swab     Status: None   Collection Time: 09/08/19  7:47 PM   Specimen: Nasopharyngeal Swab  Result Value Ref Range Status   SARS Coronavirus 2 NEGATIVE NEGATIVE Final    Comment: (NOTE) SARS-CoV-2 target nucleic acids are NOT DETECTED. The SARS-CoV-2 RNA is generally detectable in upper and lower respiratory specimens during the acute phase of infection. Negative results do not preclude SARS-CoV-2 infection, do not rule out co-infections with other pathogens, and should not be used as the sole basis for treatment or other patient management decisions. Negative results must be combined with clinical observations, patient history, and epidemiological information. The expected result is Negative. Fact Sheet for Patients: SugarRoll.be Fact Sheet for Healthcare Providers: https://www.woods-mathews.com/ This test is not yet approved or cleared by the [place] FDA and  has been authorized for detection and/or diagnosis of SARS-CoV-2 by FDA under an Emergency Use Authorization (EUA). This EUA will remain  in effect (meaning this test can be used) for the duration of the COVID-19 declaration under Section 56 4(b)(1) of the Act, 21 U.S.C. section 360bbb-3(b)(1), unless the authorization is terminated or revoked sooner. Performed at [facility], [place] [address] [place], [place] [address]   MRSA PCR Screening     Status: None   Collection Time: 09/09/19 12:55 PM   Specimen: Nasal Mucosa; Nasopharyngeal  Result Value Ref Range Status   MRSA by PCR NEGATIVE NEGATIVE Final    Comment:        The GeneXpert MRSA Assay (FDA approved for NASAL specimens only), is one component of a comprehensive MRSA colonization surveillance program. It is not intended to diagnose MRSA infection nor to guide or monitor treatment for MRSA infections. Performed at [facility], [place] [address], [place], [place] [address]      Time  coordinating discharge: Over 30 minutes  SIGNED:   Nicolette Bang, MD  [facility] 09/12/2019, 1:59 PM Pager   If 7PM-7AM, please contact night-coverage [email or web address] Password [email or web address]

## 2019-09-12 NOTE — Care Management Important Message (Signed)
Important Message  Patient Details IM Letter given to Velva Harman RN to present to the Patient Name: Tiffany Arnold MRN: [id number] Date of Birth: 20-Aug-1941   Medicare Important Message Given:  Yes     Kerin Salen 09/12/2019, 11:08 AM

## 2019-09-12 NOTE — Progress Notes (Addendum)
CHAMPAINE AMSTER   DOB:06/30/1941   [id number]   [id number]  Oncology follow up note   Subjective: Reports vaginal bleeding has improved.  He still has a small amount, but not as heavy as it was previously.  Denies chest pain or shortness of breath.  Has been transitioned from heparin to Lovenox.  Afebrile and other vital signs are stable.  Possible discharge home later today.  Objective:  Vitals:   09/12/19 0438 09/12/19 0901  BP: (!) 144/70 (!) 145/75  Pulse: 88 99  Resp: 18 18  Temp: 97.8 F (36.6 C) 98.3 F (36.8 C)  SpO2: 96% 94%    Body mass index is 47.36 kg/m.  Intake/Output Summary (Last 24 hours) at 09/12/2019 0959 Last data filed at 09/12/2019 0931 Gross per 24 hour  Intake 360 ml  Output 975 ml  Net -615 ml     Sclerae unicteric  Oropharynx clear  No peripheral adenopathy  Lungs clear -- no rales or rhonchi  Heart regular rate and rhythm  Abdomen benign, no blood on her pad   Neuro nonfocal    CBG (last 3)  Recent Labs    09/11/19 1641 09/11/19 2145 09/12/19 0728  GLUCAP 144* 113* 95     Labs:  Urine Studies No results for input(s): UHGB, CRYS in the last 72 hours.  Invalid input(s): UACOL, UAPR, USPG, UPH, UTP, UGL, UKET, UBIL, UNIT, UROB, ULEU, UEPI, UWBC, URBC, UBAC, CAST, [place], [place]  Basic Metabolic Panel: Recent Labs  Lab 09/08/19 1725 09/09/19 0440 09/10/19 0854 09/11/19 0744 09/12/19 0335  NA 140 140 139 140 139  K 4.0 4.1 4.4 4.7 4.1  CL 102 106 101 100 103  CO2 28 27 28 29 25   GLUCOSE 65* 71 106* 94 104*  BUN 13 13 13 15 21   CREATININE 0.66 0.55 0.65 0.58 0.70  CALCIUM 8.9 8.4* 8.9 9.5 9.3   GFR Estimated Creatinine Clearance: 68.4 mL/min (by C-G formula based on SCr of 0.7 mg/dL). Liver Function Tests: Recent Labs  Lab 09/08/19 1725 09/09/19 0440  AST 28 30  ALT 28 26  ALKPHOS 83 75  BILITOT 0.4 0.5  PROT 7.2 6.6  ALBUMIN 2.7* 2.6*   No results for input(s): LIPASE, AMYLASE in the last 168 hours. No  results for input(s): AMMONIA in the last 168 hours. Coagulation profile Recent Labs  Lab 09/08/19 1725  INR 1.4*    CBC: Recent Labs  Lab 09/08/19 1725 09/09/19 0440 09/10/19 0854 09/11/19 0744 09/12/19 0335  WBC 12.0* 9.9 10.9* 9.8 9.2  NEUTROABS 10.0*  --   --   --   --   HGB 9.8* 8.9* 9.7* 9.9* 9.7*  HCT 33.6* 30.6* 33.1* 34.6* 33.8*  MCV 96.8 95.9 95.7 96.4 96.8  PLT 282 264 295 325 329   Cardiac Enzymes: No results for input(s): CKTOTAL, CKMB, CKMBINDEX, TROPONINI in the last 168 hours. BNP: Invalid input(s): POCBNP CBG: Recent Labs  Lab 09/11/19 0808 09/11/19 1205 09/11/19 1641 09/11/19 2145 09/12/19 0728  GLUCAP 83 115* 144* 113* 95   D-Dimer No results for input(s): DDIMER in the last 72 hours. Hgb A1c No results for input(s): HGBA1C in the last 72 hours. Lipid Profile No results for input(s): CHOL, HDL, LDLCALC, TRIG, CHOLHDL, LDLDIRECT in the last 72 hours. Thyroid function studies No results for input(s): TSH, T4TOTAL, T3FREE, THYROIDAB in the last 72 hours.  Invalid input(s): FREET3 Anemia work up Recent Labs    09/10/19 0854  FERRITIN 144  TIBC 250  IRON 18*   Microbiology Recent Results (from the past 240 hour(s))  SARS CORONAVIRUS 2 (TAT 6-24 HRS) Nasopharyngeal Nasopharyngeal Swab     Status: None   Collection Time: 09/08/19  7:47 PM   Specimen: Nasopharyngeal Swab  Result Value Ref Range Status   SARS Coronavirus 2 NEGATIVE NEGATIVE Final    Comment: (NOTE) SARS-CoV-2 target nucleic acids are NOT DETECTED. The SARS-CoV-2 RNA is generally detectable in upper and lower respiratory specimens during the acute phase of infection. Negative results do not preclude SARS-CoV-2 infection, do not rule out co-infections with other pathogens, and should not be used as the sole basis for treatment or other patient management decisions. Negative results must be combined with clinical observations, patient history, and epidemiological  information. The expected result is Negative. Fact Sheet for Patients: SugarRoll.be Fact Sheet for Healthcare Providers: https://www.woods-mathews.com/ This test is not yet approved or cleared by the [place] FDA and  has been authorized for detection and/or diagnosis of SARS-CoV-2 by FDA under an Emergency Use Authorization (EUA). This EUA will remain  in effect (meaning this test can be used) for the duration of the COVID-19 declaration under Section 56 4(b)(1) of the Act, 21 U.S.C. section 360bbb-3(b)(1), unless the authorization is terminated or revoked sooner. Performed at [facility], [place] [address] [place], [place] [address]   MRSA PCR Screening     Status: None   Collection Time: 09/09/19 12:55 PM   Specimen: Nasal Mucosa; Nasopharyngeal  Result Value Ref Range Status   MRSA by PCR NEGATIVE NEGATIVE Final    Comment:        The GeneXpert MRSA Assay (FDA approved for NASAL specimens only), is one component of a comprehensive MRSA colonization surveillance program. It is not intended to diagnose MRSA infection nor to guide or monitor treatment for MRSA infections. Performed at [facility], [place] [address], [place],  [address]       Studies:  Dg Abd 1 View  Result Date: 09/11/2019 CLINICAL DATA:  Constipation.  Abdominal pain. EXAM: ABDOMEN - 1 VIEW COMPARISON:  None. FINDINGS: Normal bowel gas pattern.  No significant increase in colonic stool. 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 There is a right common iliac artery stent. Soft tissues otherwise unremarkable. No acute skeletal abnormality. IMPRESSION: 1. No acute findings.  No evidence of bowel obstruction. 2. No radiographic evidence of increased colonic stool. Electronically Signed   By: Lajean Manes M.D.   On: 09/11/2019 10:43   Dg Chest Port 1 View  Result Date: 09/11/2019 CLINICAL DATA:  Dyspnea. History of breast  cancer, hypertension, hyperlipidemia, diabetes, asthma. EXAM: PORTABLE CHEST 1 VIEW COMPARISON:  Chest x-rays dated 07/14/2019 and 04/27/2017. Chest CT dated 09/08/2019. FINDINGS: Heart size and mediastinal contours are stable. Lungs are clear. No pleural effusion or pneumothorax is seen. IMPRESSION: No evidence of pneumonia or pulmonary edema. Electronically Signed   By: Franki Cabot M.D.   On: 09/11/2019 09:04    Assessment: 78 y.o. female   1. saddle PE, recently diagnosed R LE DVT on [place]  2. Newly diagnosed endometrial cancer with nodal metastasis 3.  Anemia from vaginal bleeding, and underlying malignancy 4.  History of stage II right breast cancer, on adjuvant Exemestane  5.  Memory loss and cognitive dysfunction 6. Obesity  7. OSA 8. HTN    Plan:  -Has been transitioned to Lovenox and tolerating it well.  Vaginal bleeding has improved. -Has a normal ferritin  with low percent saturation and iron level.  Continue oral iron. -CT chest, abdomen pelvis unfortunately showed extensive mediastinal, abdominal and retroperitoneal adenopathy, consistent with metastatic disease from endometrial cancer, less likely from breast cancer.  Given typical image findings, but no diagnosed saddle PE, medical comorbidities, I do not feel biopsy to confirm metastasis.  She will be seen by Dr. Alvy Bimler as an outpatient for management of her metastatic endometrial cancer.  She will also follow-up with Dr. Sondra Come as an outpatient. -Hemoglobin remained stable and no transfusion is indicated. -The patient may discharge home from a medical oncology standpoint when medically stable.  Mikey Bussing, NP 09/12/2019  9:59 AM  Addendum  I have seen the patient, examined her. I agree with the assessment and and plan and have edited the notes.   Pt was eating lunch when I saw her, no significant vaginal bleeding over the weekend, hemoglobin stable.  She will likely to be discharged home today.  She is scheduled  to see my partner Dr. Alvy Bimler in 2 days in her office.   Truitt Merle  09/12/2019

## 2019-09-12 NOTE — Telephone Encounter (Signed)
Called patient's sister, [name], with appointment to see Dr. Alvy Bimler on 09/14/19 at 11:30.  Discussed that a patient education class has also been scheduled and [name] said they had attended one in 2016 before Karisma had chemotherapy for breast cancer.

## 2019-09-13 ENCOUNTER — Telehealth: Payer: Self-pay

## 2019-09-13 NOTE — Telephone Encounter (Signed)
Should be 110 mg per the hospital discharge orders.

## 2019-09-13 NOTE — Telephone Encounter (Signed)
Thank you for following up.

## 2019-09-13 NOTE — Telephone Encounter (Signed)
[name], and I discussed this, I will reach out to pharmacy and follow up with caregiver.

## 2019-09-13 NOTE — Telephone Encounter (Signed)
Transition Care Management Follow-up Telephone Call   Date discharged? 09/11/19    How have you been since you were released from the hospital? Spoke with [name], patient's sister who is helping take care of Mrs. Durene Fruits. Stated that patient is "doing fair, still extremely weak."    Do you understand why you were in the hospital? yes   Do you understand the discharge instructions? Yes    Where were you discharged to? Home, sister [name] is mainly taking care of patient. Patient's step daughter is helping when she can. Patient's husband has early onset dementia, unable to help.  [name] is reaching out to [place] to start having in home care, paper work will be faxed to [facility] for PCP to fill out. Kathy BreachTonia Ghent, Care Coordinator telephone # [phone], Fax # [phone].    Items Reviewed:  Medications reviewed: yes  Allergies reviewed: yes  Dietary changes reviewed: yes  Referrals reviewed: yes   Functional Questionnaire:   Activities of Daily Living (ADLs):   She states they are independent in the following: patient requires assistance with all ADL's  States they require assistance with the following: ambulation, bathing and hygiene, feeding, continence, grooming, toileting and dressing   Any transportation issues/concerns?: yes, [name] states "so far okay, might need assistance in the future."   Any patient concerns? Yes, [name] states that the discharge paperwork has dosage of Lovenox 120 mg BID, but the directions on the prescription states Lovenox 110 mg BID. I informed [name] that I would send message high priority to PCP and would return call with correct dosage directions.    Confirmed importance and date/time of follow-up visits scheduled yes  Provider Appointment booked with [name] 09/26/19 @ 2PM  Confirmed with patient if condition begins to worsen call PCP or go to the ER.  Patient was given the office number and encouraged to call back  with question or concerns.  : yes

## 2019-09-13 NOTE — Telephone Encounter (Signed)
Clarified with pharmacy that they did mark the 0.45ml line on the syringe for the patient and reviewed administration with the caregiver.   Spoke with Tiffany Arnold to review.  She is clear on administration, just wanted to be sure that the 110 mg dosing is what she needed to give. Per discharge summary instructions that we have, dosing should be 110mg  q 12hours.  Tiffany Arnold verbalizes understanding and has no further questions.   FYI to R. Baity, NP .

## 2019-09-14 ENCOUNTER — Encounter: Payer: Self-pay | Admitting: Hematology and Oncology

## 2019-09-14 ENCOUNTER — Inpatient Hospital Stay (HOSPITAL_BASED_OUTPATIENT_CLINIC_OR_DEPARTMENT_OTHER): Payer: Medicare PPO | Admitting: Hematology and Oncology

## 2019-09-14 ENCOUNTER — Other Ambulatory Visit: Payer: Self-pay

## 2019-09-14 ENCOUNTER — Telehealth: Payer: Self-pay | Admitting: Oncology

## 2019-09-14 ENCOUNTER — Other Ambulatory Visit: Payer: Medicare PPO

## 2019-09-14 VITALS — BP 159/69 | HR 95 | Temp 98.7°F | Resp 18 | Ht 60.0 in | Wt 232.2 lb

## 2019-09-14 DIAGNOSIS — L03113 Cellulitis of right upper limb: Secondary | ICD-10-CM | POA: Diagnosis not present

## 2019-09-14 DIAGNOSIS — G4733 Obstructive sleep apnea (adult) (pediatric): Secondary | ICD-10-CM | POA: Diagnosis not present

## 2019-09-14 DIAGNOSIS — R296 Repeated falls: Secondary | ICD-10-CM

## 2019-09-14 DIAGNOSIS — I700 Atherosclerosis of aorta: Secondary | ICD-10-CM | POA: Diagnosis not present

## 2019-09-14 DIAGNOSIS — K589 Irritable bowel syndrome without diarrhea: Secondary | ICD-10-CM | POA: Diagnosis not present

## 2019-09-14 DIAGNOSIS — C55 Malignant neoplasm of uterus, part unspecified: Secondary | ICD-10-CM | POA: Diagnosis not present

## 2019-09-14 DIAGNOSIS — S70329A Blister (nonthermal), unspecified thigh, initial encounter: Secondary | ICD-10-CM | POA: Diagnosis not present

## 2019-09-14 DIAGNOSIS — R609 Edema, unspecified: Secondary | ICD-10-CM | POA: Diagnosis not present

## 2019-09-14 DIAGNOSIS — L539 Erythematous condition, unspecified: Secondary | ICD-10-CM | POA: Diagnosis not present

## 2019-09-14 DIAGNOSIS — K580 Irritable bowel syndrome with diarrhea: Secondary | ICD-10-CM | POA: Diagnosis not present

## 2019-09-14 DIAGNOSIS — N939 Abnormal uterine and vaginal bleeding, unspecified: Secondary | ICD-10-CM | POA: Diagnosis not present

## 2019-09-14 DIAGNOSIS — Z7189 Other specified counseling: Secondary | ICD-10-CM | POA: Diagnosis not present

## 2019-09-14 DIAGNOSIS — I10 Essential (primary) hypertension: Secondary | ICD-10-CM | POA: Diagnosis not present

## 2019-09-14 DIAGNOSIS — I2692 Saddle embolus of pulmonary artery without acute cor pulmonale: Secondary | ICD-10-CM

## 2019-09-14 DIAGNOSIS — Z170 Estrogen receptor positive status [ER+]: Secondary | ICD-10-CM

## 2019-09-14 DIAGNOSIS — C541 Malignant neoplasm of endometrium: Secondary | ICD-10-CM | POA: Diagnosis not present

## 2019-09-14 DIAGNOSIS — Z23 Encounter for immunization: Secondary | ICD-10-CM | POA: Diagnosis not present

## 2019-09-14 DIAGNOSIS — C50311 Malignant neoplasm of lower-inner quadrant of right female breast: Secondary | ICD-10-CM | POA: Diagnosis not present

## 2019-09-14 DIAGNOSIS — Z79810 Long term (current) use of selective estrogen receptor modulators (SERMs): Secondary | ICD-10-CM | POA: Diagnosis not present

## 2019-09-14 DIAGNOSIS — L03115 Cellulitis of right lower limb: Secondary | ICD-10-CM | POA: Insufficient documentation

## 2019-09-14 DIAGNOSIS — Z7901 Long term (current) use of anticoagulants: Secondary | ICD-10-CM | POA: Diagnosis not present

## 2019-09-14 DIAGNOSIS — E785 Hyperlipidemia, unspecified: Secondary | ICD-10-CM | POA: Diagnosis not present

## 2019-09-14 DIAGNOSIS — D390 Neoplasm of uncertain behavior of uterus: Secondary | ICD-10-CM | POA: Diagnosis not present

## 2019-09-14 DIAGNOSIS — E119 Type 2 diabetes mellitus without complications: Secondary | ICD-10-CM | POA: Diagnosis not present

## 2019-09-14 DIAGNOSIS — E669 Obesity, unspecified: Secondary | ICD-10-CM | POA: Diagnosis not present

## 2019-09-14 DIAGNOSIS — M1990 Unspecified osteoarthritis, unspecified site: Secondary | ICD-10-CM | POA: Diagnosis not present

## 2019-09-14 DIAGNOSIS — I82401 Acute embolism and thrombosis of unspecified deep veins of right lower extremity: Secondary | ICD-10-CM | POA: Diagnosis not present

## 2019-09-14 DIAGNOSIS — R5381 Other malaise: Secondary | ICD-10-CM

## 2019-09-14 DIAGNOSIS — G3184 Mild cognitive impairment, so stated: Secondary | ICD-10-CM | POA: Diagnosis not present

## 2019-09-14 DIAGNOSIS — I2699 Other pulmonary embolism without acute cor pulmonale: Secondary | ICD-10-CM | POA: Diagnosis not present

## 2019-09-14 MED ORDER — INFLUENZA VAC A&B SA ADJ QUAD 0.5 ML IM PRSY
0.5000 mL | PREFILLED_SYRINGE | Freq: Once | INTRAMUSCULAR | Status: AC
  Administered 2019-09-14: 0.5 mL via INTRAMUSCULAR

## 2019-09-14 MED ORDER — CEPHALEXIN 500 MG PO CAPS: 500.0000 mg | ORAL_CAPSULE | Freq: Two times a day (BID) | ORAL | 0 refills | Status: DC

## 2019-09-14 MED ORDER — INFLUENZA VAC A&B SA ADJ QUAD 0.5 ML IM PRSY
PREFILLED_SYRINGE | INTRAMUSCULAR | Status: AC
  Filled 2019-09-14: qty 0.5

## 2019-09-14 MED ORDER — ENOXAPARIN SODIUM 100 MG/ML ~~LOC~~ SOLN: 100.0000 mg | Freq: Two times a day (BID) | SUBCUTANEOUS | 1 refills | Status: DC

## 2019-09-14 MED FILL — ENOXAPARIN 100 MG/ML SYR: 100 | 30 days supply | Qty: 60 | Fill #0

## 2019-09-14 NOTE — Telephone Encounter (Signed)
Tiffany Arnold and spoke to [place].  Advised her that a prescription for Lovenox has been sent in and asked if a prior approval will need to be done and how much the cost would be for the patient.  Brayton Layman said she will need a prior authorization and she will fax it over.  She is going to run the insurance and call back with the cost.

## 2019-09-14 NOTE — Assessment & Plan Note (Signed)
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 The patient is quite frail Treatment goal is strictly palliative in nature I explained to the patient and her sister the rationale of why she is not a surgical candidate Radiation therapy could be given for palliative reason due to her vaginal bleeding We discussed treatment options Due to poor performance status, I am doubtful she can tolerate combination chemotherapy with carboplatin and Taxol The patient has poor venous access Due to recent saddle PE, we are not able to interrupt anticoagulation therapy for central port placement She would likely need PICC line placement for treatment which is not ideal as I suspect there might be other complications I did not go into great detail about the side effects of combination treatment with carboplatin and Taxol Alternatively, single agent carboplatin or single agent Taxol can be administered We also discussed palliative approach with supportive care only Her sister, who is the principal caregiver will have family meeting with her son and stepdaughter further about goals of care I will call her sister on Monday for further plan of care

## 2019-09-14 NOTE — Assessment & Plan Note (Signed)
Her pulmonary embolism is likely triggered by advanced cancer I refill her prescription Lovenox

## 2019-09-14 NOTE — Assessment & Plan Note (Signed)
She is currently receiving antiestrogen treatment There is no contraindication for her to continue for now

## 2019-09-14 NOTE — Assessment & Plan Note (Signed)
She has significant issues with incontinence secondary to frequent diarrhea She will continue antidiarrheal medicine as needed

## 2019-09-14 NOTE — Assessment & Plan Note (Signed)
The patient have history of recurrent falls Currently, her sister is attempting to get additional home health at home and aid to help with her mobility around the house The patient certainly would be at high risk of devastating life-threatening bleeding while on anticoagulation therapy if she have further falls

## 2019-09-14 NOTE — Assessment & Plan Note (Signed)
She has cellulitis affecting her right leg This is likely secondary to poor circulation from her DVT I recommend 10-day course of Keflex With the patient permission, a picture is taken today

## 2019-09-14 NOTE — Progress Notes (Signed)
[place] progress notes  Patient Care Team: [name], NP as PCP - General (Internal Medicine)  CHIEF COMPLAINTS/PURPOSE OF VISIT:  Advanced metastatic uterine cancer, history of breast cancer, recent pulmonary embolism  HISTORY OF PRESENTING ILLNESS:  Tiffany Arnold 78 y.o. female was transferred to my care per request by her oncologist due to newly diagnosed advanced metastatic uterine cancer Her sister, Tiffany Arnold is her principal caregiver and healthcare power of attorney and she is present The patient is quite debilitated with mild cognitive impairment/dementia Her husband has severe dementia Her son is not involved in her care Her stepdaughter, Tiffany Arnold is out of state Her daughter, Tiffany Arnold has bipolar disorder and is not able to participate much with discussion about plan of care She had prior history of breast cancer status post lumpectomy, chemotherapy, radiation therapy and has been receiving ongoing treatment with antiestrogen therapy Since chemotherapy treatment, she has mild progressive decline overall with performance status She started to have some word finding difficulties In 2018, she was referred to see neurologist and was evaluated for altered mental status.  MRI of the brain at that time a radiatio remote history of infarct and generalized atrophy At baseline, she is dependent on caregiver on majority of activities of daily living She had recurrent falls this year In January 2020, she fell and broke her arm requiring surgical intervention In April, she fell off a bed but did not sustain major injuries She was diagnosed with DVT approximately a month ago She also started to have postmenopausal bleeding and underwent evaluation Last week, after biopsy of endometrium confirmed uterine cancer, she underwent CT scan of the chest, abdomen and pelvis.  At that time, the patient had significant bowel incontinence and while her assistant attempted to  clean her in the bathroom, the patient fell due to weakness CT scan of the head showed no evidence of intracranial bleed but incidentally, she was found to have saddle PE and was hospitalized She was just released from the hospital recently Today, she sits on the wheelchair and participate in discussion but her sister filling the majority of the information  I reviewed the patient's records extensive and collaborated the history with the patient. Summary of her history is as follows: Oncology History Overview Note  Breast cancer of lower-inner quadrant of right female breast [facility])   Staging form: Breast, AJCC 7th Edition   - Clinical stage from 06/09/2016: Stage IIB (T2, N1, M0) - Signed by Truitt Merle, MD on 06/27/2016   - Pathologic stage from 07/29/2016: Stage IIA (T1c, N1a, cM0) - Signed by Truitt Merle, MD on 08/13/2016     Breast cancer of lower-inner quadrant of right female breast ([place])  05/23/2016 Mammogram   Diagnostic mammogram and ultrasound showed suspicious architectural distortion within the right breast lower inner quadrant, measuring 1.3 cm, without sonographic corelate.   06/03/2016 Initial Biopsy   Right breast inferior lower quadrant core needle biopsy showed atypical ductal hyperplasia with calcifications   06/09/2016 Receptors her2   Breast biopsy showed ER 100% positive, PR 100% positive, HER-2 negative, Ki-67 40%   06/09/2016 Initial Diagnosis   Breast cancer of lower-inner quadrant of right female breast ([place])   06/09/2016 Initial Biopsy   Right breast inner quadrant core needle biopsy showed invasive ductal carcinoma and DCIS, grade 1-2   06/17/2016 Initial Biopsy   Right axillary lymph node core needle biopsy showed metastatic carcinoma   06/17/2016 Receptors her2   Axillary node biopsy showed ER  100% positive, PR 95% positive, HER-2 negative   06/19/2016 Imaging   Bilateral breast MRI showed locations in the lower inner right breast, largest 2.7X1.3X1.6cm, biopsy clips in 2  of this areas. There are abnormal right axillary lymph nodes showing second cortical's, no evidence of malignancy in the left breast.   07/29/2016 Surgery   Right lumpectomy and ALND   07/29/2016 Pathology Results   Right lumpectomy showed G3 IDC, DCIS, margins (-), LVI(-).  1 of 16 nodes was positive    07/29/2016 Miscellaneous   Mammaprint showed high risk disease, luminal type B   09/09/2016 - 11/12/2016 Adjuvant Chemotherapy   Docetaxel and Cytoxan (TC) every 3 weeks   12/24/2016 - 02/03/2017 Radiation Therapy   Adjuvant breast radiation 12/24/16 - 02/03/17 : Right Breast treated to 50.4 Gy in 28 fractions. Right Axilla treated to 45 Gy in 25 fractions.   02/17/2017 -  Anti-estrogen oral therapy   Letrozole 2.5 mg daily    03/23/2017 Imaging   DG Bone Density 03/23/17 ASSESSMENT: The BMD measured at Femur Neck Right is 0.809 g/cm2 with a T-score of -1.6. This patient is considered osteopenic according to [facility]) criteria.   09/08/2017 Mammogram   Diagnostic Mammogram 09/08/17  IMPRESSION: No mammographic evidence of malignancy in either breast, status post right lumpectomy. RECOMMENDATION: Diagnostic mammogram is suggested in 1 year. (Code:[id number]-B-01Y)   09/09/2018 Mammogram   09/10/2019 Mammogram IMPRESSION: No mammographic evidence of malignancy.   Uterine cancer ([place])  08/25/2019 Imaging   US pelvis Heterogeneous mass in the fundus of the uterus as well as a mass in the region of the cervix and vagina. These are felt to represent underlying neoplasm. Direct visualization is recommended as well as possible MRI of the pelvis for further delineation.   09/01/2019 Pathology Results   Endocervical biopsy - HIGH-GRADE ENDOMETRIOID ADENOCARCINOMA. SEE NOTE Immunohistochemical stains show that the tumor cells are diffusely positive for p16, p53 and vimentin. Tumor cells show focal staining for ER and are negative for monoclonal CEA. This immunophenotype is consistent  with a gynecologic primary.  ER 40% positive   09/08/2019 Imaging   Ct scan of chest, abdomen and pelvis showed: 1. Acute bilateral pulmonary embolism with large saddle embolus. Dilated main pulmonary artery suggesting pulmonary hypertension. Patient will be brought immediately to the emergency department at [facility] from the lobby of the radiology department per instructions of Dr. Denman George. 2. Infiltrative poorly marginated heterogeneously enhancing 7.0 x 4.3 x 5.0 cm midline pelvic mass centered in the region of the vaginal fornix with involvement of the uterine cervix and with intimate association with the anterior rectal wall, suspicious for malignancy of uncertain primary site. 3. Extensive lymphadenopathy throughout the mediastinum, retroperitoneum and bilateral pelvis, likely metastatic adenopathy. 4. Enlarged uterus with apparent 7.5 cm anterior uterine body solid mass, nonspecific, potentially large uterine fibroid. No adnexal masses. No ascites. 5. Cholelithiasis. Nonspecific gas in the fundal gallbladder. Consider short-term follow-up CT abdomen to exclude emphysematous cholecystitis. No biliary ductal dilatation. 6. Marked colonic diverticulosis. 7.  Aortic Atherosclerosis (ICD10-I70.0).   09/08/2019 - 09/12/2019 Hospital Admission   She was admitted to the hospital for management of severe PE.  Her anticoagulation therapy was discontinued and switched to Lovenox   09/14/2019 Cancer Staging   Staging form: Corpus Uteri - Carcinoma and Carcinosarcoma, AJCC 8th Edition - Clinical stage from 09/14/2019: FIGO Stage IVB (cT3, cN2, pM1) - Signed by Heath Lark, MD on 09/14/2019     MEDICAL HISTORY:  Past  Medical History:  Diagnosis Date   Allergic rhinitis, seasonal    Arm bruise    left arm down to hand from fall 01-05-2019   Bilateral renal cysts    simple (monitored by dr Karsten Ro)   Chronic cystitis    urologist-- dr Karsten Ro   GAD (generalized anxiety disorder)      History of cancer chemotherapy 09-09-2016  to 11-12-2016   breast cancer   History of external beam radiation therapy 12-24-2016  to 02-03-2017   right breast   History of recurrent UTIs    Humerus fracture 01/05/2019   left ,  hx total shoulder arthroplasty    Hyperlipidemia    Hypertension    IBS (irritable bowel syndrome)    mixed   Lymphedema of right upper extremity    Malignant neoplasm of lower-inner quadrant of right breast of female, estrogen receptor positive [facility])    ONCOLOGIST-- dr Burr Medico;   dx 06/ 2017,  Stage IIB (pT1c N1 cM0), Grade 1-2,  DCIS,  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/  07-29-2016  s/p  right lumpectomy w/ sln dissections (1 out of 16 positve),   completed chemo 11-12-2016,  completed radiation 02-03-2017,  started anti-estrogen therapy 02-17-2017   Memory disorder 06/22/2017   with intermittant confusion;  neurologist-- dr Krista Blue (note in epic)   Mild persistent asthma    no inhaler   OA (osteoarthritis)    OSA on CPAP    Type 2 diabetes, diet controlled ([place])    Urge incontinence of urine    Wears glasses     SURGICAL HISTORY: Past Surgical History:  Procedure Laterality Date   BREAST CYST EXCISION Right 2001   negative   BREAST LUMPECTOMY WITH NEEDLE LOCALIZATION AND AXILLARY LYMPH NODE DISSECTION Right 07/29/2016   Procedure: RIGHT BREAST LUMPECTOMY WITH DOUBLE NEEDLE LOCALIZATION AND COMPLETE RIGHT AXILLARY LYMPH NODE DISSECTION;  Surgeon: Fanny Skates, MD;  Location: [place];  Service: General;  Laterality: Right;   BREAST SURGERY Left 2000   Biopsy   BUNIONECTOMY Bilateral 1998   great toe fusion on right foot   CATARACT EXTRACTION W/ INTRAOCULAR LENS  IMPLANT, BILATERAL  right,  fall 2019;  left 2017   COLONOSCOPY W/ POLYPECTOMY     LUMBAR SPINE SURGERY  1970s   NASAL SINUS SURGERY  2015   ORIF HUMERUS FRACTURE Left 01/27/2019   Procedure: OPEN REDUCTION INTERNAL FIXATION (ORIF) LEFT HUMERUS;  Surgeon: Justice Britain, MD;  Location: [facility]  ORS;  Service: Orthopedics;  Laterality: Left;   PERIPHERAL VASCULAR THROMBECTOMY Right 08/15/2019   Procedure: PERIPHERAL VASCULAR THROMBECTOMY WITH IVC FILTER;  Surgeon: Katha Cabal, MD;  Location: [place] CV LAB;  Service: Cardiovascular;  Laterality: Right;   PORTACATH PLACEMENT N/A 09/05/2016   Procedure: INSERTION PORT-A-CATH;  Surgeon: Fanny Skates, MD;  Location: [facility] ORS;  Service: General;  Laterality: N/A;   TOTAL KNEE ARTHROPLASTY Bilateral 2007   TOTAL SHOULDER REPLACEMENT Left 1771   UMBILICAL HERNIA REPAIR  04-06-2014   _0     SOCIAL HISTORY: Social History   Socioeconomic History   Marital status: Married    Spouse name: Not on file   Number of children: 2   Years of education: 2 years college   Highest education level: Not on file  Occupational History   Occupation: Retired  Scientist, product/process development strain: Not on file   Food insecurity    Worry: Not on file    Inability: Not on file   Transportation needs    Medical:  Not on file    Non-medical: Not on file  Tobacco Use   Smoking status: Former Smoker    Packs/day: 2.00    Years: 25.00    Pack years: 50.00    Quit date: 12/23/1991    Years since quitting: 27.7   Smokeless tobacco: Never Used  Substance and Sexual Activity   Alcohol use: Not Currently   Drug use: No   Sexual activity: Not Currently  Lifestyle   Physical activity    Days per week: Not on file    Minutes per session: Not on file   Stress: Not on file  Relationships   Social connections    Talks on phone: Not on file    Gets together: Not on file    Attends religious service: Not on file    Active member of club or organization: Not on file    Attends meetings of clubs or organizations: Not on file    Relationship status: Not on file   Intimate partner violence    Fear of current or ex partner: Not on file    Emotionally abused: Not on file    Physically abused: Not on file    Forced  sexual activity: Not on file  Other Topics Concern   Not on file  Social History Narrative   Lives at home with her husband.   Right-handed.   Occasional caffeine use.    FAMILY HISTORY: Family History  Problem Relation Age of Onset   Arthritis Mother    Stroke Mother    Hypertension Mother    Cancer Father        Prostate   Stroke Father    Hypertension Father    Hypertension Maternal Grandmother    Rheum arthritis Maternal Grandfather    Stroke Maternal Grandfather    Hypertension Maternal Grandfather    Cancer Paternal Grandmother        Colon   Hypertension Paternal Grandmother    Hypertension Paternal Grandfather    Breast cancer Neg Hx     ALLERGIES:  is allergic to other.  MEDICATIONS:  Current Outpatient Medications  Medication Sig Dispense Refill   loperamide (IMODIUM A-D) 2 MG tablet Take 2 mg by mouth 4 (four) times daily as needed.     amLODipine (NORVASC) 5 MG tablet Take 1 tablet (5 mg total) by mouth daily. 30 tablet 0   cephALEXin (KEFLEX) 500 MG capsule Take 1 capsule (500 mg total) by mouth 2 (two) times daily. 20 capsule 0   cetirizine (ZYRTEC) 10 MG tablet Take 10 mg by mouth every evening.      Cholecalciferol (VITAMIN D-3) 125 MCG (5000 UT) TABS Take 5,000 Units by mouth daily.     enoxaparin (LOVENOX) 100 MG/ML injection Inject 1 mL (100 mg total) into the skin every 12 (twelve) hours. 60 mL 1   exemestane (AROMASIN) 25 MG tablet Take 1 tablet (25 mg total) by mouth daily. 30 tablet 6   ferrous sulfate 325 (65 FE) MG tablet Take 325 mg by mouth daily with breakfast.     lisinopril (ZESTRIL) 20 MG tablet Take 1 tablet (20 mg total) by mouth daily. 90 tablet 3   memantine (NAMENDA) 10 MG tablet TAKE ONE TABLET BY MOUTH TWICE DAILY (Patient taking differently: Take 10 mg by mouth 2 (two) times daily. ) 60 tablet 5   nitrofurantoin (MACRODANTIN) 100 MG capsule Take 100 mg by mouth 2 (two) times daily.     Probiotic Product  (PROBIOTIC  PO) Take 1 capsule by mouth daily.      rosuvastatin (CRESTOR) 10 MG tablet Take 1 tablet (10 mg total) by mouth daily. 30 tablet 2   sertraline (ZOLOFT) 100 MG tablet Take 1 tablet (100 mg total) by mouth daily. 90 tablet 3   No current facility-administered medications for this visit.     REVIEW OF SYSTEMS:   Constitutional: Denies fevers, chills or abnormal night sweats Eyes: Denies blurriness of vision, double vision or watery eyes Ears, nose, mouth, throat, and face: Denies mucositis or sore throat Respiratory: Denies cough, dyspnea or wheezes Lymphatics: Denies new lymphadenopathy or easy bruising Behavioral/Psych: Mood is stable, no new changes  All other systems were reviewed with the patient and are negative.  PHYSICAL EXAMINATION: ECOG PERFORMANCE STATUS: 2 - Symptomatic, <50% confined to bed  Vitals:   09/14/19 1206  BP: (!) 159/69  Pulse: 95  Resp: 18  Temp: 98.7 F (37.1 C)  SpO2: 94%   Filed Weights   09/14/19 1206  Weight: 232 lb 3.2 oz (105.3 kg)    GENERAL:alert, no distress and comfortable.  Limited examination is performed while the patient is sitting on the wheelchair.  She is obese SKIN: Noted evidence of cellulitis affecting her right shin.  See pictures EYES: normal, conjunctiva are pink and non-injected, sclera clear OROPHARYNX:no exudate, normal lips, buccal mucosa, and tongue  NECK: supple, thyroid normal size, non-tender, without nodularity LYMPH:  no palpable lymphadenopathy in the cervical, axillary or inguinal LUNGS: clear to auscultation and percussion with normal breathing effort HEART: regular rate & rhythm and no murmurs with mild bilateral lower extremity edema ABDOMEN:abdomen soft, non-tender and normal bowel sounds Musculoskeletal:no cyanosis of digits and no clubbing  PSYCH: alert & oriented x 3 with fluent speech NEURO: no focal motor/sensory deficits  LABORATORY DATA:  I have reviewed the data as listed Lab Results    Component Value Date   WBC 9.2 09/12/2019   HGB 9.7 (L) 09/12/2019   HCT 33.8 (L) 09/12/2019   MCV 96.8 09/12/2019   PLT 329 09/12/2019   Recent Labs    08/16/19 0222 09/08/19 1725 09/09/19 0440 09/10/19 0854 09/11/19 0744 09/12/19 0335  NA 142 140 140 139 140 139  K 3.5 4.0 4.1 4.4 4.7 4.1  CL 108 102 106 101 100 103  CO2 _0 GLUCOSE 147* 65* 71 106* 94 104*  BUN _1 CREATININE 0.62 0.66 0.55 0.65 0.58 0.70  CALCIUM 8.9 8.9 8.4* 8.9 9.5 9.3  GFRNONAA >60 >60 >60 >60 >60 >60  GFRAA >60 >60 >60 >60 >60 >60  PROT 6.6 7.2 6.6  --   --   --   ALBUMIN 3.0* 2.7* 2.6*  --   --   --   AST _2 --   --   --   ALT _3 --   --   --   ALKPHOS 70 83 75  --   --   --   BILITOT 0.7 0.4 0.5  --   --   --         RADIOGRAPHIC STUDIES: I have personally reviewed the radiological images as listed and agreed with the findings in the report. Dg Abd 1 View  Result Date: 09/11/2019 CLINICAL DATA:  Constipation.  Abdominal pain. EXAM: ABDOMEN - 1 VIEW COMPARISON:  None. FINDINGS: Normal bowel gas pattern.  No significant increase in colonic stool. Vena cava  filter projects to the right of L2 and L3. There is a right common iliac artery stent. Soft tissues otherwise unremarkable. No acute skeletal abnormality. IMPRESSION: 1. No acute findings.  No evidence of bowel obstruction. 2. No radiographic evidence of increased colonic stool. Electronically Signed   By: Lajean Manes M.D.   On: 09/11/2019 10:43   Ct Head Wo Contrast  Result Date: 09/08/2019 CLINICAL DATA:  [place] in bathroom recent CT with positive PE EXAM: CT HEAD WITHOUT CONTRAST TECHNIQUE: Contiguous axial images were obtained from the base of the skull through the vertex without intravenous contrast. COMPARISON:  CT 09/08/2019, 04/13/2019 FINDINGS: Brain: No acute territorial infarction, hemorrhage or intracranial mass. Atrophy and moderate hypodensity in the white matter consistent with small  vessel ischemic change. Stable ventricle size Vascular: No hyperdense vessels.  Carotid vascular calcification Skull: Normal. Negative for fracture or focal lesion. Sinuses/Orbits: Postsurgical changes of the maxillary and ethmoid sinuses with mild mucosal thickening. Other: Small left forehead soft tissue swelling IMPRESSION: 1. No CT evidence for acute intracranial abnormality. 2. Atrophy and small vessel ischemic changes of white matter. Electronically Signed   By: Donavan Foil M.D.   On: 09/08/2019 19:11   Ct Chest W Contrast  Result Date: 09/08/2019 CLINICAL DATA:  Uterine and cervical mass on exam. Gyn malignancy is suspected. Staging evaluation. History of right breast cancer. Dyspnea. EXAM: CT CHEST, ABDOMEN, AND PELVIS WITH CONTRAST TECHNIQUE: Multidetector CT imaging of the chest, abdomen and pelvis was performed following the standard protocol during bolus administration of intravenous contrast. CONTRAST:  16m OMNIPAQUE IOHEXOL 300 MG/ML  SOLN COMPARISON:  07/14/2019 chest radiograph. FINDINGS: CT CHEST FINDINGS Cardiovascular: Normal heart size. No significant pericardial effusion/thickening. Atherosclerotic nonaneurysmal thoracic aorta. Dilated main pulmonary artery (3.5 cm diameter). There is acute bilateral pulmonary embolism including a large saddle embolus. RV/LV ratio 0.90. Mediastinum/Nodes: No discrete thyroid nodules. Unremarkable esophagus. Surgical clips in the right axilla. No pathologically enlarged axillary nodes. Right paratracheal adenopathy up to 2.0 cm short axis diameter (series 2/image 18). Enlarged 2.1 cm AP window node (series 2/image 20). Left prevascular adenopathy up to 1.1 cm (series 2/image 17). Enlarged 1.1 cm left posterior mediastinal node along the descending aorta (series 2/image 26). Bilateral retrocrural adenopathy measuring up to 1.3 cm on the right (series 2/image 46). Lungs/Pleura: No pneumothorax. No pleural effusion. No acute consolidative airspace disease,  lung masses or significant pulmonary nodules. Musculoskeletal: No aggressive appearing focal osseous lesions. Marked thoracic spondylosis. CT ABDOMEN PELVIS FINDINGS Hepatobiliary: Normal liver size. Subcentimeter posterior right liver hypodense lesion is too small to characterize (series 2/image 45). No additional liver lesions. Cholelithiasis. Scattered gas within the contracted fundal gallbladder. No definite gallbladder wall thickening or pericholecystic fluid. No biliary ductal dilatation. Pancreas: Normal, with no mass or duct dilation. Spleen: Normal size. No mass. Adrenals/Urinary Tract: No discrete adrenal nodules. Mild fullness of the central right renal collecting system without overt right hydronephrosis. No left hydronephrosis. Symmetric contrast nephrograms. Scattered subcentimeter hypodense renal cortical lesions in both kidneys are too small to characterize and require no follow-up. Small parapelvic renal cysts in the left kidney. Normal bladder. Stomach/Bowel: Normal non-distended stomach. Normal caliber small bowel with no small bowel wall thickening. Normal appendix. Oral contrast transits to the left colon. Marked colonic diverticulosis, most prominent in the sigmoid colon, with no definite large bowel wall thickening or significant pericolonic fat stranding. Vascular/Lymphatic: Atherosclerotic nonaneurysmal abdominal aorta. Patent portal, splenic, hepatic and renal veins. IVC filter in place within the infrarenal IVC. Right external  iliac vein stent. Extensive retroperitoneal and bilateral pelvic lymphadenopathy involving aortocaval, left para-aortic, bilateral common iliac, bilateral external iliac and right inguinal nodal chains. Representative enlarged 1.8 cm right inguinal node (series 7/image 103), 2.0 cm right external iliac node (series 7/image 90), 2.1 cm right common iliac node (series 7/image 76), 2.0 cm posterior aortocaval node (series 7/image 64) and 1.6 cm left para-aortic node  (series 7/image 62). Reproductive: Enlarged uterus. Infiltrative poorly marginated heterogeneously enhancing 7.0 x 4.3 x 5.0 cm midline pelvic mass centered in the region of the vaginal fornix with involvement of the uterine cervix and intimate association with the anterior rectal wall. Scattered gas in the uterine cavity. Apparent 7.5 cm anterior uterine body solid mass, nonspecific, potentially a fibroid. No adnexal masses. Other: No pneumoperitoneum, ascites or focal fluid collection. Musculoskeletal: No aggressive appearing focal osseous lesions. 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 Chronic appearing osteolytic change at L3-4. IMPRESSION: 1. Acute bilateral pulmonary embolism with large saddle embolus. Dilated main pulmonary artery suggesting pulmonary hypertension. Patient will be brought immediately to the emergency department at [facility] from the lobby of the radiology department per instructions of Dr. Denman George. 2. Infiltrative poorly marginated heterogeneously enhancing 7.0 x 4.3 x 5.0 cm midline pelvic mass centered in the region of the vaginal fornix with involvement of the uterine cervix and with intimate association with the anterior rectal wall, suspicious for malignancy of uncertain primary site. 3. Extensive lymphadenopathy throughout the mediastinum, retroperitoneum and bilateral pelvis, likely metastatic adenopathy. 4. Enlarged uterus with apparent 7.5 cm anterior uterine body solid mass, nonspecific, potentially large uterine fibroid. No adnexal masses. No ascites. 5. Cholelithiasis. Nonspecific gas in the fundal gallbladder. Consider short-term follow-up CT abdomen to exclude emphysematous cholecystitis. No biliary ductal dilatation. 6. Marked colonic diverticulosis. 7.  Aortic Atherosclerosis (ICD10-I70.0). Critical Value/emergent results were called by telephone at the time of interpretation on 09/08/2019 at 3:20 pm to provider EMMA ROSSI, MD,  who verbally acknowledged these results. Electronically Signed   By: Ilona Sorrel M.D.   On: 09/08/2019 15:46   Ct Abdomen Pelvis W Contrast  Result Date: 09/08/2019 CLINICAL DATA:  Uterine and cervical mass on exam. Gyn malignancy is suspected. Staging evaluation. History of right breast cancer. Dyspnea. EXAM: CT CHEST, ABDOMEN, AND PELVIS WITH CONTRAST TECHNIQUE: Multidetector CT imaging of the chest, abdomen and pelvis was performed following the standard protocol during bolus administration of intravenous contrast. CONTRAST:  176m OMNIPAQUE IOHEXOL 300 MG/ML  SOLN COMPARISON:  07/14/2019 chest radiograph. FINDINGS: CT CHEST FINDINGS Cardiovascular: Normal heart size. No significant pericardial effusion/thickening. Atherosclerotic nonaneurysmal thoracic aorta. Dilated main pulmonary artery (3.5 cm diameter). There is acute bilateral pulmonary embolism including a large saddle embolus. RV/LV ratio 0.90. Mediastinum/Nodes: No discrete thyroid nodules. Unremarkable esophagus. Surgical clips in the right axilla. No pathologically enlarged axillary nodes. Right paratracheal adenopathy up to 2.0 cm short axis diameter (series 2/image 18). Enlarged 2.1 cm AP window node (series 2/image 20). Left prevascular adenopathy up to 1.1 cm (series 2/image 17). Enlarged 1.1 cm left posterior mediastinal node along the descending aorta (series 2/image 26). Bilateral retrocrural adenopathy measuring up to 1.3 cm on the right (series 2/image 46). Lungs/Pleura: No pneumothorax. No pleural effusion. No acute consolidative airspace disease, lung masses or significant pulmonary nodules. Musculoskeletal: No aggressive appearing focal osseous lesions. Marked thoracic spondylosis. CT ABDOMEN PELVIS FINDINGS Hepatobiliary: Normal liver size. Subcentimeter posterior right liver hypodense lesion is too small to characterize (series 2/image 45). No additional liver lesions.  Cholelithiasis. Scattered gas within the contracted fundal  gallbladder. No definite gallbladder wall thickening or pericholecystic fluid. No biliary ductal dilatation. Pancreas: Normal, with no mass or duct dilation. Spleen: Normal size. No mass. Adrenals/Urinary Tract: No discrete adrenal nodules. Mild fullness of the central right renal collecting system without overt right hydronephrosis. No left hydronephrosis. Symmetric contrast nephrograms. Scattered subcentimeter hypodense renal cortical lesions in both kidneys are too small to characterize and require no follow-up. Small parapelvic renal cysts in the left kidney. Normal bladder. Stomach/Bowel: Normal non-distended stomach. Normal caliber small bowel with no small bowel wall thickening. Normal appendix. Oral contrast transits to the left colon. Marked colonic diverticulosis, most prominent in the sigmoid colon, with no definite large bowel wall thickening or significant pericolonic fat stranding. Vascular/Lymphatic: Atherosclerotic nonaneurysmal abdominal aorta. Patent portal, splenic, hepatic and renal veins. IVC filter in place within the infrarenal IVC. Right external iliac vein stent. Extensive retroperitoneal and bilateral pelvic lymphadenopathy involving aortocaval, left para-aortic, bilateral common iliac, bilateral external iliac and right inguinal nodal chains. Representative enlarged 1.8 cm right inguinal node (series 7/image 103), 2.0 cm right external iliac node (series 7/image 90), 2.1 cm right common iliac node (series 7/image 76), 2.0 cm posterior aortocaval node (series 7/image 64) and 1.6 cm left para-aortic node (series 7/image 62). Reproductive: Enlarged uterus. Infiltrative poorly marginated heterogeneously enhancing 7.0 x 4.3 x 5.0 cm midline pelvic mass centered in the region of the vaginal fornix with involvement of the uterine cervix and intimate association with the anterior rectal wall. Scattered gas in the uterine cavity. Apparent 7.5 cm anterior uterine body solid mass, nonspecific,  potentially a fibroid. No adnexal masses. Other: No pneumoperitoneum, ascites or focal fluid collection. Musculoskeletal: No aggressive appearing focal osseous lesions. 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 Chronic appearing osteolytic change at L3-4. IMPRESSION: 1. Acute bilateral pulmonary embolism with large saddle embolus. Dilated main pulmonary artery suggesting pulmonary hypertension. Patient will be brought immediately to the emergency department at [facility] from the lobby of the radiology department per instructions of Dr. Denman George. 2. Infiltrative poorly marginated heterogeneously enhancing 7.0 x 4.3 x 5.0 cm midline pelvic mass centered in the region of the vaginal fornix with involvement of the uterine cervix and with intimate association with the anterior rectal wall, suspicious for malignancy of uncertain primary site. 3. Extensive lymphadenopathy throughout the mediastinum, retroperitoneum and bilateral pelvis, likely metastatic adenopathy. 4. Enlarged uterus with apparent 7.5 cm anterior uterine body solid mass, nonspecific, potentially large uterine fibroid. No adnexal masses. No ascites. 5. Cholelithiasis. Nonspecific gas in the fundal gallbladder. Consider short-term follow-up CT abdomen to exclude emphysematous cholecystitis. No biliary ductal dilatation. 6. Marked colonic diverticulosis. 7.  Aortic Atherosclerosis (ICD10-I70.0). Critical Value/emergent results were called by telephone at the time of interpretation on 09/08/2019 at 3:20 pm to provider EMMA ROSSI, MD, who verbally acknowledged these results. Electronically Signed   By: Ilona Sorrel M.D.   On: 09/08/2019 15:46   Dg Chest Port 1 View  Result Date: 09/11/2019 CLINICAL DATA:  Dyspnea. History of breast cancer, hypertension, hyperlipidemia, diabetes, asthma. EXAM: PORTABLE CHEST 1 VIEW COMPARISON:  Chest x-rays dated 07/14/2019 and 04/27/2017. Chest CT dated 09/08/2019. FINDINGS:  Heart size and mediastinal contours are stable. Lungs are clear. No pleural effusion or pneumothorax is seen. IMPRESSION: No evidence of pneumonia or pulmonary edema. Electronically Signed   By: Franki Cabot M.D.   On: 09/11/2019 09:04   US Pelvic Complete With Transvaginal  Result Date: 08/25/2019  CLINICAL DATA:  Postmenopausal bleeding EXAM: TRANSABDOMINAL AND TRANSVAGINAL ULTRASOUND OF PELVIS TECHNIQUE: Both transabdominal and transvaginal ultrasound examinations of the pelvis were performed. Transabdominal technique was performed for global imaging of the pelvis including uterus, ovaries, adnexal regions, and pelvic cul-de-sac. It was necessary to proceed with endovaginal exam following the transabdominal exam to visualize the ovaries. COMPARISON:  None FINDINGS: Uterus Measurements: 8.9 x 5.8 x 7.0 cm. = volume: 190 mL. A 6.9 x 7.0 cm heterogeneous mass is noted in the region of the uterine fundus. Additionally there is a area of increased echogenicity within the superior aspect of the vagina near the cervix which may represent a second separate mass lesion. Correlation with the physical exam is recommended. MRI may be helpful for further delineation. Endometrium Obscured by the underlying heterogeneous mass. Right ovary Not visualized Left ovary Not visualized Other findings No definitive Arnold fluid is seen. IMPRESSION: Heterogeneous mass in the fundus of the uterus as well as a mass in the region of the cervix and vagina. These are felt to represent underlying neoplasm. Direct visualization is recommended as well as possible MRI of the pelvis for further delineation. Electronically Signed   By: Inez Catalina M.D.   On: 08/25/2019 11:16   Vas [place] Lower Extremity Venous (dvt)  Result Date: 09/05/2019  Lower Venous Study Indications: Edema.  Risk Factors: DVT Rt LE DVT Surgery 08/15/2019: Inferior Venocavogram; Placement of a [place]ali IVC Filter Infrarenal. Mechanical Thrombectomy of the Right CFV, Right  External iliac Vein and Right Common Eliac vein. Comparison Study: 08/15/2019 Performing Technologist: Almira Coaster RVS  Examination Guidelines: A complete evaluation includes B-mode imaging, spectral Doppler, color Doppler, and power Doppler as needed of all accessible portions of each vessel. Bilateral testing is considered an integral part of a complete examination. Limited examinations for reoccurring indications may be performed as noted.  +---------+---------------+---------+-----------+----------+--------------+  RIGHT     Compressibility Phasicity Spontaneity Properties Thrombus Aging  +---------+---------------+---------+-----------+----------+--------------+  CFV       Full            Yes       Yes                                    +---------+---------------+---------+-----------+----------+--------------+  SFJ       Full            Yes       Yes                                    +---------+---------------+---------+-----------+----------+--------------+  FV Prox   None            No        No                                     +---------+---------------+---------+-----------+----------+--------------+  FV Mid    None            No        No                                     +---------+---------------+---------+-----------+----------+--------------+  FV Distal None  No        No                                     +---------+---------------+---------+-----------+----------+--------------+  PFV       Full            Yes       Yes                                    +---------+---------------+---------+-----------+----------+--------------+  POP       None            No        No                                     +---------+---------------+---------+-----------+----------+--------------+  PTV       Full            Yes       Yes                                    +---------+---------------+---------+-----------+----------+--------------+  PERO      Full            Yes       Yes                                     +---------+---------------+---------+-----------+----------+--------------+  GSV       Full            Yes       Yes                                    +---------+---------------+---------+-----------+----------+--------------+   +----+---------------+---------+-----------+----------+--------------+  LEFT Compressibility Phasicity Spontaneity Properties Thrombus Aging  +----+---------------+---------+-----------+----------+--------------+  CFV  Full            Yes       Yes                                    +----+---------------+---------+-----------+----------+--------------+     Summary: Right: Findings consistent with acute deep vein thrombosis involving the right femoral vein, and right popliteal vein. Essentially Unchanged, the Right CFV appears to no evidence of DVT.  *See table(s) above for measurements and observations. Electronically signed by [name] MD on 09/05/2019 at 5:26:00 PM.    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 The patient is quite frail Treatment goal is strictly palliative in nature I explained to the patient and her sister the rationale of why she is not a surgical candidate Radiation therapy could be given for palliative reason due to her vaginal bleeding We discussed treatment options Due to poor performance status, I am doubtful she can tolerate combination chemotherapy with carboplatin and Taxol The patient has poor venous access Due to recent saddle PE, we are not able to  interrupt anticoagulation therapy for central port placement She would likely need PICC line placement for treatment which is not ideal as I suspect there might be other complications I did not go into great detail about the side effects of combination treatment with carboplatin and Taxol Alternatively, single agent carboplatin or single agent Taxol can be administered We  also discussed palliative approach with supportive care only Her sister, who is the principal caregiver will have family meeting with her son and stepdaughter further about goals of care I will call her sister on Monday for further plan of care  Breast cancer of lower-inner quadrant of right female breast [facility]) She is currently receiving antiestrogen treatment There is no contraindication for her to continue for now  IBS (irritable bowel syndrome) She has significant issues with incontinence secondary to frequent diarrhea She will continue antidiarrheal medicine as needed  Cellulitis of right leg She has cellulitis affecting her right leg This is likely secondary to poor circulation from her DVT I recommend 10-day course of Keflex With the patient permission, a picture is taken today  Pulmonary embolism ([place]) Her pulmonary embolism is likely triggered by advanced cancer I refill her prescription Lovenox  Vaginal bleeding, abnormal She has vaginal bleeding secondary to untreated malignancy She will continue oral iron supplement She will have further bleeding while on anticoagulation therapy We discussed the role of palliative radiation treatment  Recurrent falls The patient have history of recurrent falls Currently, her sister is attempting to get additional home health at home and aid to help with her mobility around the house The patient certainly would be at high risk of devastating life-threatening bleeding while on anticoagulation therapy if she have further falls  Physical debility We have extensive discussions about goals of care Her sister, Tiffany Arnold and her niece, Lysbeth Galas are involved in her care Her sister is her dedicated healthcare power of attorney We discussed the role of treatment which is strictly palliative in nature We discussed the role of supportive care with or without chemotherapy We discussed prognosis briefly   No orders of the defined types were placed  in this encounter.  We discussed the importance of preventive care and reviewed the vaccination programs. She does not have any prior allergic reactions to influenza vaccination. She agrees to proceed with influenza vaccination today and we will administer it today at the clinic.  All questions were answered. The patient knows to call the clinic with any problems, questions or concerns. I spent 80 minutes counseling the patient face to face. The total time spent in the appointment was 80 minutes and more than 50% was on counseling.     Heath Lark, MD 09/14/2019 3:58 PM

## 2019-09-14 NOTE — Telephone Encounter (Signed)
[facility] and started prior authorization for Lovenox.  Reference number [id number]. They have expedited the request and we should have the results in 24 hours.

## 2019-09-14 NOTE — Assessment & Plan Note (Signed)
She has vaginal bleeding secondary to untreated malignancy She will continue oral iron supplement She will have further bleeding while on anticoagulation therapy We discussed the role of palliative radiation treatment

## 2019-09-14 NOTE — Assessment & Plan Note (Signed)
We have extensive discussions about goals of care Her sister, Tiffany Arnold and her niece, Tiffany Arnold are involved in her care Her sister is her dedicated healthcare power of attorney We discussed the role of treatment which is strictly palliative in nature We discussed the role of supportive care with or without chemotherapy We discussed prognosis briefly

## 2019-09-15 ENCOUNTER — Other Ambulatory Visit: Payer: Self-pay | Admitting: Internal Medicine

## 2019-09-15 ENCOUNTER — Telehealth: Payer: Self-pay | Admitting: Internal Medicine

## 2019-09-15 DIAGNOSIS — I824Z1 Acute embolism and thrombosis of unspecified deep veins of right distal lower extremity: Secondary | ICD-10-CM | POA: Diagnosis not present

## 2019-09-15 DIAGNOSIS — J4530 Mild persistent asthma, uncomplicated: Secondary | ICD-10-CM | POA: Diagnosis not present

## 2019-09-15 DIAGNOSIS — F411 Generalized anxiety disorder: Secondary | ICD-10-CM | POA: Diagnosis not present

## 2019-09-15 DIAGNOSIS — E119 Type 2 diabetes mellitus without complications: Secondary | ICD-10-CM | POA: Diagnosis not present

## 2019-09-15 DIAGNOSIS — I10 Essential (primary) hypertension: Secondary | ICD-10-CM | POA: Diagnosis not present

## 2019-09-15 DIAGNOSIS — F0280 Dementia in other diseases classified elsewhere without behavioral disturbance: Secondary | ICD-10-CM | POA: Diagnosis not present

## 2019-09-15 DIAGNOSIS — E785 Hyperlipidemia, unspecified: Secondary | ICD-10-CM | POA: Diagnosis not present

## 2019-09-15 DIAGNOSIS — G300 Alzheimer's disease with early onset: Secondary | ICD-10-CM | POA: Diagnosis not present

## 2019-09-15 DIAGNOSIS — C50911 Malignant neoplasm of unspecified site of right female breast: Secondary | ICD-10-CM | POA: Diagnosis not present

## 2019-09-15 NOTE — Telephone Encounter (Signed)
[employer] paperwork in [facility] in box Pt has hospital follow up appointment 10/5

## 2019-09-16 ENCOUNTER — Telehealth: Payer: Self-pay

## 2019-09-16 NOTE — Telephone Encounter (Signed)
Is she still having issues with loose stools? How often are they?

## 2019-09-16 NOTE — Telephone Encounter (Signed)
Stephanie clinical mgr with [facility] at [facility] left v/m that pt was at [facility] from 09/08/19 - 09/12/19 and when pt was discharged no orders to restart [place] orders were given. [facility] request verbal order for restarting HH PT and nursing.Please advise.

## 2019-09-16 NOTE — Telephone Encounter (Signed)
Ok to restart home health and PT

## 2019-09-16 NOTE — Telephone Encounter (Signed)
VO given to [facility] per Dr Viviana Simpler

## 2019-09-16 NOTE — Telephone Encounter (Signed)
Can not be filled out until her appt, need to discuss these questions with [name], her caretaker.

## 2019-09-19 ENCOUNTER — Other Ambulatory Visit: Payer: Self-pay | Admitting: Hematology and Oncology

## 2019-09-19 ENCOUNTER — Telehealth: Payer: Self-pay | Admitting: Oncology

## 2019-09-19 ENCOUNTER — Telehealth: Payer: Self-pay | Admitting: Hematology and Oncology

## 2019-09-19 DIAGNOSIS — K589 Irritable bowel syndrome without diarrhea: Secondary | ICD-10-CM

## 2019-09-19 DIAGNOSIS — G300 Alzheimer's disease with early onset: Secondary | ICD-10-CM | POA: Diagnosis not present

## 2019-09-19 DIAGNOSIS — Z86010 Personal history of colonic polyps: Secondary | ICD-10-CM

## 2019-09-19 DIAGNOSIS — I824Z1 Acute embolism and thrombosis of unspecified deep veins of right distal lower extremity: Secondary | ICD-10-CM | POA: Diagnosis not present

## 2019-09-19 DIAGNOSIS — Z7984 Long term (current) use of oral hypoglycemic drugs: Secondary | ICD-10-CM

## 2019-09-19 DIAGNOSIS — C50911 Malignant neoplasm of unspecified site of right female breast: Secondary | ICD-10-CM | POA: Diagnosis not present

## 2019-09-19 DIAGNOSIS — I10 Essential (primary) hypertension: Secondary | ICD-10-CM | POA: Diagnosis not present

## 2019-09-19 DIAGNOSIS — C55 Malignant neoplasm of uterus, part unspecified: Secondary | ICD-10-CM

## 2019-09-19 DIAGNOSIS — C50311 Malignant neoplasm of lower-inner quadrant of right female breast: Secondary | ICD-10-CM

## 2019-09-19 DIAGNOSIS — Z95828 Presence of other vascular implants and grafts: Secondary | ICD-10-CM

## 2019-09-19 DIAGNOSIS — F411 Generalized anxiety disorder: Secondary | ICD-10-CM | POA: Diagnosis not present

## 2019-09-19 DIAGNOSIS — F0280 Dementia in other diseases classified elsewhere without behavioral disturbance: Secondary | ICD-10-CM | POA: Diagnosis not present

## 2019-09-19 DIAGNOSIS — G4733 Obstructive sleep apnea (adult) (pediatric): Secondary | ICD-10-CM

## 2019-09-19 DIAGNOSIS — E785 Hyperlipidemia, unspecified: Secondary | ICD-10-CM | POA: Diagnosis not present

## 2019-09-19 DIAGNOSIS — Z7901 Long term (current) use of anticoagulants: Secondary | ICD-10-CM

## 2019-09-19 DIAGNOSIS — E119 Type 2 diabetes mellitus without complications: Secondary | ICD-10-CM | POA: Diagnosis not present

## 2019-09-19 DIAGNOSIS — Z7189 Other specified counseling: Secondary | ICD-10-CM

## 2019-09-19 DIAGNOSIS — M810 Age-related osteoporosis without current pathological fracture: Secondary | ICD-10-CM

## 2019-09-19 DIAGNOSIS — Z87891 Personal history of nicotine dependence: Secondary | ICD-10-CM

## 2019-09-19 DIAGNOSIS — J4530 Mild persistent asthma, uncomplicated: Secondary | ICD-10-CM | POA: Diagnosis not present

## 2019-09-19 MED ORDER — ONDANSETRON HCL 8 MG PO TABS: 8.0000 mg | ORAL_TABLET | Freq: Three times a day (TID) | ORAL | 1 refills | Status: DC | PRN

## 2019-09-19 MED ORDER — PROCHLORPERAZINE MALEATE 10 MG PO TABS: 10.0000 mg | ORAL_TABLET | Freq: Four times a day (QID) | ORAL | 1 refills | Status: DC | PRN

## 2019-09-19 NOTE — Telephone Encounter (Signed)
I have placed order for PICC line; pls coordinate with sister. She cannot have port due to recent PE on Lovenox Her niece is a Marine scientist and said she can do PICC line care at home I have send scheduling msg to see her back next Monday to start chemo so please try to get PICC line this week

## 2019-09-19 NOTE — Telephone Encounter (Signed)
I spoke with her sister over the telephone She is wondering whether a second opinion elsewhere would be helpful She will discussed the plan with her daughter and will call the GYN navigator if needed We discussed prognosis with or without treatment I have addressed all her questions Currently, she has no further follow-up appointment, pending decision from family

## 2019-09-19 NOTE — Progress Notes (Signed)
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

## 2019-09-19 NOTE — Telephone Encounter (Signed)
Tiffany Arnold called and said she discussed treatments with Tiffany Arnold today.  Tiffany Arnold would like to try treatment with one of the single chemotherapy medications that Dr. Alvy Bimler recommended.  Asked which medication they would prefer and she said whichever one Dr. Alvy Bimler thinks would provide "any kind of results."

## 2019-09-20 ENCOUNTER — Telehealth: Payer: Self-pay | Admitting: Oncology

## 2019-09-20 DIAGNOSIS — E119 Type 2 diabetes mellitus without complications: Secondary | ICD-10-CM | POA: Diagnosis not present

## 2019-09-20 DIAGNOSIS — G300 Alzheimer's disease with early onset: Secondary | ICD-10-CM | POA: Diagnosis not present

## 2019-09-20 DIAGNOSIS — E785 Hyperlipidemia, unspecified: Secondary | ICD-10-CM | POA: Diagnosis not present

## 2019-09-20 DIAGNOSIS — I824Z1 Acute embolism and thrombosis of unspecified deep veins of right distal lower extremity: Secondary | ICD-10-CM | POA: Diagnosis not present

## 2019-09-20 DIAGNOSIS — I10 Essential (primary) hypertension: Secondary | ICD-10-CM | POA: Diagnosis not present

## 2019-09-20 DIAGNOSIS — F0280 Dementia in other diseases classified elsewhere without behavioral disturbance: Secondary | ICD-10-CM | POA: Diagnosis not present

## 2019-09-20 DIAGNOSIS — F411 Generalized anxiety disorder: Secondary | ICD-10-CM | POA: Diagnosis not present

## 2019-09-20 DIAGNOSIS — C50911 Malignant neoplasm of unspecified site of right female breast: Secondary | ICD-10-CM | POA: Diagnosis not present

## 2019-09-20 DIAGNOSIS — J4530 Mild persistent asthma, uncomplicated: Secondary | ICD-10-CM | POA: Diagnosis not present

## 2019-09-20 NOTE — Telephone Encounter (Signed)
Called Tiffany Arnold and advised her of PICC line appointment on Wednesday, 09/21/19 at 3:30 with 3:00 arrival at [facility] Radiology.  Also discussed that appointments will be scheduled to see Dr. Alvy Bimler on Monday and to start chemotherapy.  She verbalized understanding and agreement.

## 2019-09-21 ENCOUNTER — Other Ambulatory Visit: Payer: Self-pay | Admitting: Internal Medicine

## 2019-09-21 ENCOUNTER — Ambulatory Visit (HOSPITAL_COMMUNITY)
Admission: RE | Admit: 2019-09-21 | Discharge: 2019-09-21 | Disposition: A | Discharge status: Home / Self Care | Payer: Medicare PPO | Source: Ambulatory Visit | Attending: Hematology and Oncology | Admitting: Hematology and Oncology

## 2019-09-21 ENCOUNTER — Other Ambulatory Visit: Payer: Self-pay

## 2019-09-21 ENCOUNTER — Other Ambulatory Visit: Payer: Self-pay | Admitting: Hematology and Oncology

## 2019-09-21 DIAGNOSIS — Z5111 Encounter for antineoplastic chemotherapy: Secondary | ICD-10-CM | POA: Diagnosis not present

## 2019-09-21 DIAGNOSIS — Z4789 Encounter for other orthopedic aftercare: Secondary | ICD-10-CM | POA: Diagnosis not present

## 2019-09-21 DIAGNOSIS — C55 Malignant neoplasm of uterus, part unspecified: Secondary | ICD-10-CM

## 2019-09-21 DIAGNOSIS — C50919 Malignant neoplasm of unspecified site of unspecified female breast: Secondary | ICD-10-CM | POA: Diagnosis not present

## 2019-09-21 DIAGNOSIS — S42202A Unspecified fracture of upper end of left humerus, initial encounter for closed fracture: Secondary | ICD-10-CM | POA: Diagnosis not present

## 2019-09-21 MED ORDER — LIDOCAINE HCL 1 % IJ SOLN
INTRAMUSCULAR | Status: AC
  Filled 2019-09-21: qty 20

## 2019-09-21 MED ORDER — HEPARIN SOD (PORK) LOCK FLUSH 100 UNIT/ML IV SOLN
INTRAVENOUS | Status: AC
  Filled 2019-09-21: qty 5

## 2019-09-21 NOTE — Procedures (Signed)
Left SL basilic vein PICC placed without immediate complications.  Length 51 cm.  Tip SVC /right atrial junction.  Medication used-1% lidocaine to skin and subcutaneous tissue.  EBL < 1cc. Ok to use.

## 2019-09-21 NOTE — Telephone Encounter (Signed)
I spoke to sister [name] and she reports that pt does take PRN especially when she is going out for appts ect, so not daily.... pt is out and she would like refill to have PRN

## 2019-09-22 ENCOUNTER — Telehealth: Payer: Self-pay | Admitting: Hematology and Oncology

## 2019-09-22 ENCOUNTER — Telehealth: Payer: Self-pay | Admitting: Internal Medicine

## 2019-09-22 ENCOUNTER — Other Ambulatory Visit: Payer: Self-pay | Admitting: Hematology and Oncology

## 2019-09-22 ENCOUNTER — Telehealth: Payer: Self-pay | Admitting: Oncology

## 2019-09-22 ENCOUNTER — Other Ambulatory Visit (HOSPITAL_COMMUNITY): Payer: Medicare PPO

## 2019-09-22 DIAGNOSIS — Z452 Encounter for adjustment and management of vascular access device: Secondary | ICD-10-CM

## 2019-09-22 DIAGNOSIS — C55 Malignant neoplasm of uterus, part unspecified: Secondary | ICD-10-CM

## 2019-09-22 DIAGNOSIS — I82401 Acute embolism and thrombosis of unspecified deep veins of right lower extremity: Secondary | ICD-10-CM

## 2019-09-22 NOTE — Telephone Encounter (Signed)
I talk with patient regarding 10/5

## 2019-09-22 NOTE — Telephone Encounter (Signed)
Orders are in

## 2019-09-22 NOTE — Telephone Encounter (Signed)
Left a message for Carolynn Sayers at [facility]

## 2019-09-22 NOTE — Telephone Encounter (Signed)
Joelene Millin with [facility] is calling to request verbal orders.   She would like to start Seeing the patient  1 x a week for 6 weeks for PT   C/B # [phone]

## 2019-09-22 NOTE — Telephone Encounter (Signed)
Called Tiffany Arnold and Tiffany Arnold regarding PICC line supplies and dressing changes. Discussed that we can send in the flush supplies to their pharmacy but that she will need to come in once a week for dressing changes.  They said they would like to call their home health agency to see if they can provide the supplies.  They called their home health agency, [facility]. They do not provide PICC line flush or dressing supplies and recommended [facility] to provide PICC line care.    Tiffany Arnold said they would like a referral to [facility] and said she is due for a flush on Saturday.

## 2019-09-23 ENCOUNTER — Other Ambulatory Visit: Payer: Self-pay | Admitting: Hematology and Oncology

## 2019-09-23 ENCOUNTER — Telehealth: Payer: Self-pay | Admitting: Oncology

## 2019-09-23 DIAGNOSIS — F0280 Dementia in other diseases classified elsewhere without behavioral disturbance: Secondary | ICD-10-CM | POA: Diagnosis not present

## 2019-09-23 DIAGNOSIS — C50911 Malignant neoplasm of unspecified site of right female breast: Secondary | ICD-10-CM | POA: Diagnosis not present

## 2019-09-23 DIAGNOSIS — I824Z1 Acute embolism and thrombosis of unspecified deep veins of right distal lower extremity: Secondary | ICD-10-CM | POA: Diagnosis not present

## 2019-09-23 DIAGNOSIS — E119 Type 2 diabetes mellitus without complications: Secondary | ICD-10-CM | POA: Diagnosis not present

## 2019-09-23 DIAGNOSIS — J4530 Mild persistent asthma, uncomplicated: Secondary | ICD-10-CM | POA: Diagnosis not present

## 2019-09-23 DIAGNOSIS — F411 Generalized anxiety disorder: Secondary | ICD-10-CM | POA: Diagnosis not present

## 2019-09-23 DIAGNOSIS — Z452 Encounter for adjustment and management of vascular access device: Secondary | ICD-10-CM

## 2019-09-23 DIAGNOSIS — I10 Essential (primary) hypertension: Secondary | ICD-10-CM | POA: Diagnosis not present

## 2019-09-23 DIAGNOSIS — C55 Malignant neoplasm of uterus, part unspecified: Secondary | ICD-10-CM

## 2019-09-23 DIAGNOSIS — G300 Alzheimer's disease with early onset: Secondary | ICD-10-CM | POA: Diagnosis not present

## 2019-09-23 DIAGNOSIS — E785 Hyperlipidemia, unspecified: Secondary | ICD-10-CM | POA: Diagnosis not present

## 2019-09-23 MED ORDER — NORMAL SALINE FLUSH 0.9 % IV SOLN: INTRAVENOUS | 0 refills | Status: AC

## 2019-09-23 MED FILL — NORMAL SALINE FLUSH SYRINGE: 0.9 | 30 days supply | Qty: 600 | Fill #0

## 2019-09-23 NOTE — Telephone Encounter (Signed)
Ok for PT weekly x 6 weeks.

## 2019-09-23 NOTE — Telephone Encounter (Signed)
Called [facility] and a box of 60 saline flushes would be $60.00.  They do not carry chlorhexidine wipes but could order them.  Called [facility] and the box of flushes would be $29.60 there. They also do not carry chlorhexidine wipes.  Called [name] and she would like the flushes sent to [place].  Called in prescription to [place].  Also called Lysbeth Galas and advised her about the saline flushes and that she does not need to use Heparin per Dr. Alvy Bimler.  Also discussed that the pharmacies do not have chlorhexidine wipes in stock.  She can use an alcohol swap and rup the port for 60 seconds and let dry before flushing.  She verbalized agreement and said they have a box of alcohol swaps that they are using for the Lovenox injections.

## 2019-09-26 ENCOUNTER — Inpatient Hospital Stay: Payer: Medicare PPO | Attending: Hematology

## 2019-09-26 ENCOUNTER — Other Ambulatory Visit: Payer: Self-pay

## 2019-09-26 ENCOUNTER — Telehealth: Payer: Self-pay | Admitting: Oncology

## 2019-09-26 ENCOUNTER — Inpatient Hospital Stay: Payer: Medicare PPO

## 2019-09-26 ENCOUNTER — Other Ambulatory Visit: Payer: Self-pay | Admitting: Hematology and Oncology

## 2019-09-26 ENCOUNTER — Telehealth: Payer: Self-pay | Admitting: Hematology and Oncology

## 2019-09-26 ENCOUNTER — Inpatient Hospital Stay: Payer: Medicare PPO | Admitting: Internal Medicine

## 2019-09-26 ENCOUNTER — Inpatient Hospital Stay (HOSPITAL_BASED_OUTPATIENT_CLINIC_OR_DEPARTMENT_OTHER): Payer: Medicare PPO | Admitting: Hematology and Oncology

## 2019-09-26 VITALS — BP 147/67 | HR 81

## 2019-09-26 DIAGNOSIS — N281 Cyst of kidney, acquired: Secondary | ICD-10-CM | POA: Insufficient documentation

## 2019-09-26 DIAGNOSIS — Z170 Estrogen receptor positive status [ER+]: Secondary | ICD-10-CM | POA: Diagnosis not present

## 2019-09-26 DIAGNOSIS — C50311 Malignant neoplasm of lower-inner quadrant of right female breast: Secondary | ICD-10-CM

## 2019-09-26 DIAGNOSIS — Z95828 Presence of other vascular implants and grafts: Secondary | ICD-10-CM

## 2019-09-26 DIAGNOSIS — F411 Generalized anxiety disorder: Secondary | ICD-10-CM | POA: Diagnosis not present

## 2019-09-26 DIAGNOSIS — I10 Essential (primary) hypertension: Secondary | ICD-10-CM | POA: Diagnosis not present

## 2019-09-26 DIAGNOSIS — I700 Atherosclerosis of aorta: Secondary | ICD-10-CM | POA: Insufficient documentation

## 2019-09-26 DIAGNOSIS — Z86711 Personal history of pulmonary embolism: Secondary | ICD-10-CM | POA: Insufficient documentation

## 2019-09-26 DIAGNOSIS — E785 Hyperlipidemia, unspecified: Secondary | ICD-10-CM | POA: Insufficient documentation

## 2019-09-26 DIAGNOSIS — Z86718 Personal history of other venous thrombosis and embolism: Secondary | ICD-10-CM | POA: Insufficient documentation

## 2019-09-26 DIAGNOSIS — C55 Malignant neoplasm of uterus, part unspecified: Secondary | ICD-10-CM | POA: Insufficient documentation

## 2019-09-26 DIAGNOSIS — I82401 Acute embolism and thrombosis of unspecified deep veins of right lower extremity: Secondary | ICD-10-CM | POA: Insufficient documentation

## 2019-09-26 DIAGNOSIS — G300 Alzheimer's disease with early onset: Secondary | ICD-10-CM | POA: Diagnosis not present

## 2019-09-26 DIAGNOSIS — F0280 Dementia in other diseases classified elsewhere without behavioral disturbance: Secondary | ICD-10-CM | POA: Diagnosis not present

## 2019-09-26 DIAGNOSIS — I2699 Other pulmonary embolism without acute cor pulmonale: Secondary | ICD-10-CM | POA: Insufficient documentation

## 2019-09-26 DIAGNOSIS — R609 Edema, unspecified: Secondary | ICD-10-CM | POA: Diagnosis not present

## 2019-09-26 DIAGNOSIS — E119 Type 2 diabetes mellitus without complications: Secondary | ICD-10-CM | POA: Insufficient documentation

## 2019-09-26 DIAGNOSIS — R634 Abnormal weight loss: Secondary | ICD-10-CM | POA: Insufficient documentation

## 2019-09-26 DIAGNOSIS — K5730 Diverticulosis of large intestine without perforation or abscess without bleeding: Secondary | ICD-10-CM | POA: Insufficient documentation

## 2019-09-26 DIAGNOSIS — R232 Flushing: Secondary | ICD-10-CM | POA: Diagnosis not present

## 2019-09-26 DIAGNOSIS — I824Z1 Acute embolism and thrombosis of unspecified deep veins of right distal lower extremity: Secondary | ICD-10-CM | POA: Diagnosis not present

## 2019-09-26 DIAGNOSIS — K5900 Constipation, unspecified: Secondary | ICD-10-CM | POA: Diagnosis not present

## 2019-09-26 DIAGNOSIS — Z5111 Encounter for antineoplastic chemotherapy: Secondary | ICD-10-CM | POA: Diagnosis not present

## 2019-09-26 DIAGNOSIS — I6782 Cerebral ischemia: Secondary | ICD-10-CM | POA: Insufficient documentation

## 2019-09-26 DIAGNOSIS — R1900 Intra-abdominal and pelvic swelling, mass and lump, unspecified site: Secondary | ICD-10-CM | POA: Insufficient documentation

## 2019-09-26 DIAGNOSIS — Z452 Encounter for adjustment and management of vascular access device: Secondary | ICD-10-CM | POA: Insufficient documentation

## 2019-09-26 DIAGNOSIS — R413 Other amnesia: Secondary | ICD-10-CM | POA: Diagnosis not present

## 2019-09-26 DIAGNOSIS — D61818 Other pancytopenia: Secondary | ICD-10-CM | POA: Insufficient documentation

## 2019-09-26 DIAGNOSIS — R590 Localized enlarged lymph nodes: Secondary | ICD-10-CM | POA: Diagnosis not present

## 2019-09-26 DIAGNOSIS — R0600 Dyspnea, unspecified: Secondary | ICD-10-CM | POA: Diagnosis not present

## 2019-09-26 DIAGNOSIS — K8020 Calculus of gallbladder without cholecystitis without obstruction: Secondary | ICD-10-CM | POA: Diagnosis not present

## 2019-09-26 DIAGNOSIS — M5136 Other intervertebral disc degeneration, lumbar region: Secondary | ICD-10-CM | POA: Diagnosis not present

## 2019-09-26 DIAGNOSIS — Z7189 Other specified counseling: Secondary | ICD-10-CM

## 2019-09-26 DIAGNOSIS — J4530 Mild persistent asthma, uncomplicated: Secondary | ICD-10-CM | POA: Diagnosis not present

## 2019-09-26 DIAGNOSIS — L03115 Cellulitis of right lower limb: Secondary | ICD-10-CM | POA: Insufficient documentation

## 2019-09-26 DIAGNOSIS — J45909 Unspecified asthma, uncomplicated: Secondary | ICD-10-CM | POA: Insufficient documentation

## 2019-09-26 DIAGNOSIS — I2692 Saddle embolus of pulmonary artery without acute cor pulmonale: Secondary | ICD-10-CM

## 2019-09-26 DIAGNOSIS — N852 Hypertrophy of uterus: Secondary | ICD-10-CM | POA: Diagnosis not present

## 2019-09-26 DIAGNOSIS — C50911 Malignant neoplasm of unspecified site of right female breast: Secondary | ICD-10-CM | POA: Diagnosis not present

## 2019-09-26 DIAGNOSIS — Z7901 Long term (current) use of anticoagulants: Secondary | ICD-10-CM | POA: Insufficient documentation

## 2019-09-26 DIAGNOSIS — Z923 Personal history of irradiation: Secondary | ICD-10-CM | POA: Insufficient documentation

## 2019-09-26 DIAGNOSIS — Z9221 Personal history of antineoplastic chemotherapy: Secondary | ICD-10-CM | POA: Insufficient documentation

## 2019-09-26 DIAGNOSIS — Z79899 Other long term (current) drug therapy: Secondary | ICD-10-CM | POA: Insufficient documentation

## 2019-09-26 DIAGNOSIS — F0390 Unspecified dementia without behavioral disturbance: Secondary | ICD-10-CM | POA: Insufficient documentation

## 2019-09-26 LAB — CBC WITH DIFFERENTIAL (CANCER CENTER ONLY)
Abs Immature Granulocytes: 0.01 10*3/uL (ref 0.00–0.07)
Basophils Absolute: 0 10*3/uL (ref 0.0–0.1)
Basophils Relative: 0 %
Eosinophils Absolute: 0.2 10*3/uL (ref 0.0–0.5)
Eosinophils Relative: 2 %
HCT: 37.6 % (ref 36.0–46.0)
Hemoglobin: 11.1 g/dL — ABNORMAL LOW (ref 12.0–15.0)
Immature Granulocytes: 0 %
Lymphocytes Relative: 14 %
Lymphs Abs: 1 10*3/uL (ref 0.7–4.0)
MCH: 28 pg (ref 26.0–34.0)
MCHC: 29.5 g/dL — ABNORMAL LOW (ref 30.0–36.0)
MCV: 94.7 fL (ref 80.0–100.0)
Monocytes Absolute: 0.5 10*3/uL (ref 0.1–1.0)
Monocytes Relative: 7 %
Neutro Abs: 5.5 10*3/uL (ref 1.7–7.7)
Neutrophils Relative %: 77 %
Platelet Count: 308 10*3/uL (ref 150–400)
RBC: 3.97 MIL/uL (ref 3.87–5.11)
RDW: 16.9 % — ABNORMAL HIGH (ref 11.5–15.5)
WBC Count: 7.3 10*3/uL (ref 4.0–10.5)
nRBC: 0 % (ref 0.0–0.2)

## 2019-09-26 LAB — CMP (CANCER CENTER ONLY)
ALT: 17 U/L (ref 0–44)
AST: 17 U/L (ref 15–41)
Albumin: 3.2 g/dL — ABNORMAL LOW (ref 3.5–5.0)
Alkaline Phosphatase: 91 U/L (ref 38–126)
Anion gap: 9 (ref 5–15)
BUN: 19 mg/dL (ref 8–23)
CO2: 27 mmol/L (ref 22–32)
Calcium: 9.2 mg/dL (ref 8.9–10.3)
Chloride: 104 mmol/L (ref 98–111)
Creatinine: 0.75 mg/dL (ref 0.44–1.00)
GFR, Est AFR Am: >60 mL/min (ref >60)
GFR, Estimated: >60 mL/min (ref >60)
Glucose, Bld: 188 mg/dL — ABNORMAL HIGH (ref 70–99)
Potassium: 4.1 mmol/L (ref 3.5–5.1)
Sodium: 140 mmol/L (ref 135–145)
Total Bilirubin: 0.2 mg/dL — ABNORMAL LOW (ref 0.3–1.2)
Total Protein: 7.4 g/dL (ref 6.5–8.1)

## 2019-09-26 MED ORDER — SODIUM CHLORIDE 0.9 % IV SOLN
Freq: Once | INTRAVENOUS | Status: AC
  Administered 2019-09-26: 11:00:00 via INTRAVENOUS
  Filled 2019-09-26: qty 5

## 2019-09-26 MED ORDER — SODIUM CHLORIDE 0.9% FLUSH
10.0000 mL | Freq: Once | INTRAVENOUS | Status: AC
  Administered 2019-09-26: 10 mL
  Filled 2019-09-26: qty 10

## 2019-09-26 MED ORDER — PALONOSETRON HCL INJECTION 0.25 MG/5ML
0.2500 mg | Freq: Once | INTRAVENOUS | Status: AC
  Administered 2019-09-26: 0.25 mg via INTRAVENOUS

## 2019-09-26 MED ORDER — HEPARIN SOD (PORK) LOCK FLUSH 100 UNIT/ML IV SOLN
250.0000 [IU] | Freq: Once | INTRAVENOUS | Status: AC | PRN
  Administered 2019-09-26: 250 [IU]
  Filled 2019-09-26: qty 5

## 2019-09-26 MED ORDER — PALONOSETRON HCL INJECTION 0.25 MG/5ML
INTRAVENOUS | Status: AC
  Filled 2019-09-26: qty 5

## 2019-09-26 MED ORDER — SODIUM CHLORIDE 0.9 % IV SOLN
529.0000 mg | Freq: Once | INTRAVENOUS | Status: AC
  Administered 2019-09-26: 530 mg via INTRAVENOUS
  Filled 2019-09-26: qty 53

## 2019-09-26 MED ORDER — SODIUM CHLORIDE 0.9% FLUSH
10.0000 mL | INTRAVENOUS | Status: DC | PRN
  Administered 2019-09-26: 10 mL
  Filled 2019-09-26: qty 10

## 2019-09-26 MED ORDER — SODIUM CHLORIDE 0.9 % IV SOLN
Freq: Once | INTRAVENOUS | Status: AC
  Administered 2019-09-26: 10:00:00 via INTRAVENOUS
  Filled 2019-09-26: qty 250

## 2019-09-26 NOTE — Telephone Encounter (Signed)
Tiffany Arnold called and said that Tiffany Arnold needs to get her Lovenox injection between 9:30 and 10:00 this morning.  She has it with her and can give it to herself with prompting.

## 2019-09-26 NOTE — Patient Instructions (Signed)
[facility] Discharge Instructions for Patients Receiving Chemotherapy  Today you received the following chemotherapy agents: Carboplatin  To help prevent nausea and vomiting after your treatment, we encourage you to take your nausea medication as prescribed by your physician.   If you develop nausea and vomiting that is not controlled by your nausea medication, call the clinic.   BELOW ARE SYMPTOMS THAT SHOULD BE REPORTED IMMEDIATELY:  *FEVER GREATER THAN 100.5 F  *CHILLS WITH OR WITHOUT FEVER  NAUSEA AND VOMITING THAT IS NOT CONTROLLED WITH YOUR NAUSEA MEDICATION  *UNUSUAL SHORTNESS OF BREATH  *UNUSUAL BRUISING OR BLEEDING  TENDERNESS IN MOUTH AND THROAT WITH OR WITHOUT PRESENCE OF ULCERS  *URINARY PROBLEMS  *BOWEL PROBLEMS  UNUSUAL RASH Items with * indicate a potential emergency and should be followed up as soon as possible.  Feel free to call the clinic should you have any questions or concerns. The clinic phone number is [phone].  Please show the [facility] at check-in to the Emergency Department and triage nurse.  Carboplatin injection What is this medicine? CARBOPLATIN (KAR boe pla tin) is a chemotherapy drug. It targets fast dividing cells, like cancer cells, and causes these cells to die. This medicine is used to treat ovarian cancer and many other cancers. This medicine may be used for other purposes; ask your health care provider or pharmacist if you have questions. COMMON BRAND NAME(S): Paraplatin What should I tell my health care provider before I take this medicine? They need to know if you have any of these conditions:  blood disorders  hearing problems  kidney disease  recent or ongoing radiation therapy  an unusual or allergic reaction to carboplatin, cisplatin, other chemotherapy, other medicines, foods, dyes, or preservatives  pregnant or trying to get pregnant  breast-feeding How should I use this medicine? This  drug is usually given as an infusion into a vein. It is administered in a hospital or clinic by a specially trained health care professional. Talk to your pediatrician regarding the use of this medicine in children. Special care may be needed. Overdosage: If you think you have taken too much of this medicine contact a poison control center or emergency room at once. NOTE: This medicine is only for you. Do not share this medicine with others. What if I miss a dose? It is important not to miss a dose. Call your doctor or health care professional if you are unable to keep an appointment. What may interact with this medicine?  medicines for seizures  medicines to increase blood counts like filgrastim, pegfilgrastim, sargramostim  some antibiotics like amikacin, gentamicin, neomycin, streptomycin, tobramycin  vaccines Talk to your doctor or health care professional before taking any of these medicines:  acetaminophen  aspirin  ibuprofen  ketoprofen  naproxen This list may not describe all possible interactions. Give your health care provider a list of all the medicines, herbs, non-prescription drugs, or dietary supplements you use. Also tell them if you smoke, drink alcohol, or use illegal drugs. Some items may interact with your medicine. What should I watch for while using this medicine? Your condition will be monitored carefully while you are receiving this medicine. You will need important blood work done while you are taking this medicine. This drug may make you feel generally unwell. This is not uncommon, as chemotherapy can affect healthy cells as well as cancer cells. Report any side effects. Continue your course of treatment even though you feel ill unless your doctor tells  you to stop. In some cases, you may be given additional medicines to help with side effects. Follow all directions for their use. Call your doctor or health care professional for advice if you get a fever,  chills or sore throat, or other symptoms of a cold or flu. Do not treat yourself. This drug decreases your body's ability to fight infections. Try to avoid being around people who are sick. This medicine may increase your risk to bruise or bleed. Call your doctor or health care professional if you notice any unusual bleeding. Be careful brushing and flossing your teeth or using a toothpick because you may get an infection or bleed more easily. If you have any dental work done, tell your dentist you are receiving this medicine. Avoid taking products that contain aspirin, acetaminophen, ibuprofen, naproxen, or ketoprofen unless instructed by your doctor. These medicines may hide a fever. Do not become pregnant while taking this medicine. Women should inform their doctor if they wish to become pregnant or think they might be pregnant. There is a potential for serious side effects to an unborn child. Talk to your health care professional or pharmacist for more information. Do not breast-feed an infant while taking this medicine. What side effects may I notice from receiving this medicine? Side effects that you should report to your doctor or health care professional as soon as possible:  allergic reactions like skin rash, itching or hives, swelling of the face, lips, or tongue  signs of infection - fever or chills, cough, sore throat, pain or difficulty passing urine  signs of decreased platelets or bleeding - bruising, pinpoint red spots on the skin, black, tarry stools, nosebleeds  signs of decreased red blood cells - unusually weak or tired, fainting spells, lightheadedness  breathing problems  changes in hearing  changes in vision  chest pain  high blood pressure  low blood counts - This drug may decrease the number of white blood cells, red blood cells and platelets. You may be at increased risk for infections and bleeding.  nausea and vomiting  pain, swelling, redness or irritation at  the injection site  pain, tingling, numbness in the hands or feet  problems with balance, talking, walking  trouble passing urine or change in the amount of urine Side effects that usually do not require medical attention (report to your doctor or health care professional if they continue or are bothersome):  hair loss  loss of appetite  metallic taste in the mouth or changes in taste This list may not describe all possible side effects. Call your doctor for medical advice about side effects. You may report side effects to FDA at 1-800-FDA-1088. Where should I keep my medicine? This drug is given in a hospital or clinic and will not be stored at home. NOTE: This sheet is a summary. It may not cover all possible information. If you have questions about this medicine, talk to your doctor, pharmacist, or health care provider.  2020 Elsevier/Gold Standard (2008-03-14 14:38:05)

## 2019-09-26 NOTE — Telephone Encounter (Signed)
Gave calendar in tx area

## 2019-09-26 NOTE — Progress Notes (Signed)
Once I brought the PT back she seemed a little winded. I checked her O2 and it was 94 eventually got up to 98. She also seemed confused about when her birthday was took her a few moments to get the correct date. I made Elnita Maxwell, RN aware and also the physician.

## 2019-09-26 NOTE — Patient Instructions (Signed)
PICC Home Care Guide ° °A peripherally inserted central catheter (PICC) is a form of IV access that allows medicines and IV fluids to be quickly distributed throughout the body. The PICC is a long, thin, flexible tube (catheter) that is inserted into a vein in the upper arm. The catheter ends in a large vein in the chest (superior vena cava, or SVC). After the PICC is inserted, a chest X-ray may be done to make sure that it is in the correct place. °A PICC may be placed for different reasons, such as: °· To give medicines and liquid nutrition. °· To give IV fluids and blood products. °· If there is trouble placing a peripheral intravenous (PIV) catheter. °If taken care of properly, a PICC can remain in place for several months. Having a PICC can also allow a person to go home from the hospital sooner. Medicine and PICC care can be managed at home by a family member, caregiver, or home health care team. °What are the risks? °Generally, having a PICC is safe. However, problems may occur, including: °· A blood clot (thrombus) forming in or at the tip of the PICC. °· A blood clot forming in a vein (deep vein thrombosis) or traveling to the lung (pulmonary embolism). °· Inflammation of the vein (phlebitis) in which the PICC is placed. °· Infection. Central line associated blood stream infection (CLABSI) is a serious infection that often requires hospitalization. °· PICC movement (malposition). The PICC tip may move from its original position due to excessive physical activity, forceful coughing, sneezing, or vomiting. °· A break or cut in the PICC. It is important not to use scissors near the PICC. °· Nerve or tendon irritation or injury during PICC insertion. °How to take care of your PICC °Preventing problems °· You and any caregivers should wash your hands often with soap. Wash hands: °? Before touching the PICC line or the infusion device. °? Before changing a bandage (dressing). °· Flush the PICC as told by your  health care provider. Let your health care provider know right away if the PICC is hard to flush or does not flush. Do not use force to flush the PICC. °· Do not use a syringe that is less than 10 mL to flush the PICC. °· Avoid blood pressure checks on the arm in which the PICC is placed. °· Never pull or tug on the PICC. °· Do not take the PICC out yourself. Only a trained clinical professional should remove the PICC. °· Use clean and sterile supplies only. Keep the supplies in a dry place. Do not reuse needles, syringes, or any other supplies. Doing that can lead to infection. °· Keep pets and children away from your PICC line. °· Check the PICC insertion site every day for signs of infection. Check for: °? Leakage. °? Redness, swelling, or pain. °? Fluid or blood. °? Warmth. °? Pus or a bad smell. °PICC dressing care °· Keep your PICC bandage (dressing) clean and dry to prevent infection. °· Do not take baths, swim, or use a hot tub until your health care provider approves. Ask your health care provider if you can take showers. You may only be allowed to take sponge baths for bathing. When you are allowed to shower: °? Ask your health care provider to teach you how to wrap the PICC line. °? Cover the PICC line with clear plastic wrap and tape to keep it dry while showering. °· Follow instructions from your health care provider   about how to take care of your insertion site and dressing. Make sure you: °? Wash your hands with soap and water before you change your bandage (dressing). If soap and water are not available, use hand sanitizer. °? Change your dressing as told by your health care provider. °? Leave stitches (sutures), skin glue, or adhesive strips in place. These skin closures may need to stay in place for 2 weeks or longer. If adhesive strip edges start to loosen and curl up, you may trim the loose edges. Do not remove adhesive strips completely unless your health care provider tells you to do  that. °· Change your PICC dressing if it becomes loose or wet. °General instructions ° °· Carry your PICC identification card or wear a medical alert bracelet at all times. °· Keep the tube clamped at all times, unless it is being used. °· Carry a smooth-edge clamp with you at all times to place on the tube if it breaks. °· Do not use scissors or sharp objects near the tube. °· You may bend your arm and move it freely. If your PICC is near or at the bend of your elbow, avoid activity with repeated motion at the elbow. °· Avoid lifting heavy objects as told by your health care provider. °· Keep all follow-up visits as told by your health care provider. This is important. °Disposal of supplies °· Throw away any syringes in a disposal container that is meant for sharp items (sharps container). You can buy a sharps container from a pharmacy, or you can make one by using an empty hard plastic bottle with a cover. °· Place any used dressings or infusion bags into a plastic bag. Throw that bag in the trash. °Contact a health care provider if: °· You have pain in your arm, ear, face, or teeth. °· You have a fever or chills. °· You have redness, swelling, or pain around the insertion site. °· You have fluid or blood coming from the insertion site. °· Your insertion site feels warm to the touch. °· You have pus or a bad smell coming from the insertion site. °· Your skin feels hard and raised around the insertion site. °Get help right away if: °· Your PICC is accidentally pulled all the way out. If this happens, cover the insertion site with a bandage or gauze dressing. Do not throw the PICC away. Your health care provider will need to check it. °· Your PICC was tugged or pulled and has partially come out. Do not  push the PICC back in. °· You cannot flush the PICC, it is hard to flush, or the PICC leaks around the insertion site when it is flushed. °· You hear a "flushing" sound when the PICC is flushed. °· You feel your  heart racing or skipping beats. °· There is a hole or tear in the PICC. °· You have swelling in the arm in which the PICC was inserted. °· You have a red streak going up your arm from where the PICC was inserted. °Summary °· A peripherally inserted central catheter (PICC) is a long, thin, flexible tube (catheter) that is inserted into a vein in the upper arm. °· The PICC is inserted using a sterile technique by a specially trained nurse or physician. Only a trained clinical professional should remove it. °· Keep your PICC identification card with you at all times. °· Avoid blood pressure checks on the arm in which the PICC is placed. °· If cared for   properly, a PICC can remain in place for several months. Having a PICC can also allow a person to go home from the hospital sooner. °This information is not intended to replace advice given to you by your health care provider. Make sure you discuss any questions you have with your health care provider. °Document Released: 06/14/2003 Document Revised: 11/20/2017 Document Reviewed: 01/10/2017 °Elsevier Patient Education © 2020 [employer] ° °

## 2019-09-27 ENCOUNTER — Encounter: Payer: Self-pay | Admitting: Hematology and Oncology

## 2019-09-27 ENCOUNTER — Telehealth: Payer: Self-pay | Admitting: *Deleted

## 2019-09-27 NOTE — Assessment & Plan Note (Signed)
She has poor performance status This is also compounded by significant comorbidities and baseline moderate dementia The patient tried to indicate her desire to go on treatment but was not able to make full informed decision/informed consent The plan of care is discussed with her sister who is her dedicated healthcare power of attorney  We discussed the role of chemotherapy. The intent is of palliative intent.  We discussed some of the risks, benefits, side-effects of carboplatin  Some of the short term side-effects included, though not limited to, including weight loss, life threatening infections, risk of allergic reactions, need for transfusions of blood products, nausea, vomiting, change in bowel habits, loss of hair, admission to hospital for various reasons, and risks of death.   Long term side-effects are also discussed including risks of infertility, permanent damage to nerve function, hearing loss, chronic fatigue, kidney damage with possibility needing hemodialysis, and rare secondary malignancy including bone marrow disorders.  The patient is aware that the response rates discussed earlier is not guaranteed.  After a long discussion, patient made an informed decision to proceed with the prescribed plan of care.   Patient education material was dispensed. I recommend 3 cycles of treatment before repeat imaging studies

## 2019-09-27 NOTE — Telephone Encounter (Signed)
VO given and lmovm

## 2019-09-27 NOTE — Telephone Encounter (Signed)
Connected with niece Lysbeth Galas and received return call from [place] for Tiffany Arnold chemotherapy F/U.  Patient is doing well.  Denies n/v.  Denies any new side effects beyond "symptom of feeling tired having to get up so early.  [name] reports she said she is feeling pretty good today."  Bowel and bladder functioning well.  Eating and drinking well.  Instructed to drink 64 oz minimum daily or at least the day before, of and after treatment.   Asked what time to come in Monday for PICC dressing change but ended call to call navigator.  Denies further questions or needs at this time.  Encouraged to call [phone] Mon -Fri 8:00 am - 4:30 pm or anytime as needed for symptoms, changes or event outside office hours.

## 2019-09-27 NOTE — Assessment & Plan Note (Signed)
This is stable/mildly improved We will observe closely

## 2019-09-27 NOTE — Assessment & Plan Note (Signed)
She tolerated anticoagulation therapy well without recent bleeding complications

## 2019-09-27 NOTE — Progress Notes (Signed)
[facility] OFFICE PROGRESS NOTE  Patient Care Team: [name], NP as PCP - General (Internal Medicine)  ASSESSMENT & PLAN:  Uterine cancer [facility]) She has poor performance status This is also compounded by significant comorbidities and baseline moderate dementia The patient tried to indicate her desire to go on treatment but was not able to make full informed decision/informed consent The plan of care is discussed with her sister who is her dedicated healthcare power of attorney  We discussed the role of chemotherapy. The intent is of palliative intent.  We discussed some of the risks, benefits, side-effects of carboplatin  Some of the short term side-effects included, though not limited to, including weight loss, life threatening infections, risk of allergic reactions, need for transfusions of blood products, nausea, vomiting, change in bowel habits, loss of hair, admission to hospital for various reasons, and risks of death.   Long term side-effects are also discussed including risks of infertility, permanent damage to nerve function, hearing loss, chronic fatigue, kidney damage with possibility needing hemodialysis, and rare secondary malignancy including bone marrow disorders.  The patient is aware that the response rates discussed earlier is not guaranteed.  After a long discussion, patient made an informed decision to proceed with the prescribed plan of care.   Patient education material was dispensed. I recommend 3 cycles of treatment before repeat imaging studies   Cellulitis of right leg This is stable/mildly improved We will observe closely  Pulmonary embolism (HCC) She tolerated anticoagulation therapy well without recent bleeding complications  Memory disorder She has significant memory impairment at baseline She is oriented in person but not fully in place or time In my opinion, she is not capable to make full informed decision about treatment  decisions After extensive discussion with her sister, she is in agreement to accompany the patient for all her future appointment for safety reasons  Goals of care, counseling/discussion Her sister is her dedicated healthcare power of attorney She understood that goals of care is strictly palliative in nature   No orders of the defined types were placed in this encounter.   INTERVAL HISTORY: Please see below for problem oriented charting. She returns today to begin cycle 1 of treatment Family member is not available but ultimately, I discussed the plans of care with her sister The patient is not fully oriented She recognizes me is a healthcare agent but not able to verbalize my role as her doctor She indicated she came here to be treated for her medical condition but was not able to fully explain what kind of medical condition she is suffering from She indicated currently it is the beginning of the month and it is fall and she was able to indicate the year of 2020 but she cannot give me the date or the month She is able to say that this is a healthcare place but not able to indicate the location of the cancer center She is able to pronounce her name and give me her date of birth although she was not able to do that with the nursing staff earlier She denies pain She denies shortness of breath  SUMMARY OF ONCOLOGIC HISTORY: Oncology History Overview Note  Uterine cancer  40% ER positive MSI stable    Breast cancer of lower-inner quadrant of right female breast ([place])  05/23/2016 Mammogram   Diagnostic mammogram and ultrasound showed suspicious architectural distortion within the right breast lower inner quadrant, measuring 1.3 cm, without sonographic corelate.  06/03/2016 Initial Biopsy   Right breast inferior lower quadrant core needle biopsy showed atypical ductal hyperplasia with calcifications   06/09/2016 Receptors her2   Breast biopsy showed ER 100% positive, PR 100% positive,  HER-2 negative, Ki-67 40%   06/09/2016 Initial Diagnosis   Breast cancer of lower-inner quadrant of right female breast ([place])   06/09/2016 Initial Biopsy   Right breast inner quadrant core needle biopsy showed invasive ductal carcinoma and DCIS, grade 1-2   06/17/2016 Initial Biopsy   Right axillary lymph node core needle biopsy showed metastatic carcinoma   06/17/2016 Receptors her2   Axillary node biopsy showed ER 100% positive, PR 95% positive, HER-2 negative   06/19/2016 Imaging   Bilateral breast MRI showed locations in the lower inner right breast, largest 2.7X1.3X1.6cm, biopsy clips in 2 of this areas. There are abnormal right axillary lymph nodes showing second cortical's, no evidence of malignancy in the left breast.   07/29/2016 Surgery   Right lumpectomy and ALND   07/29/2016 Pathology Results   Right lumpectomy showed G3 IDC, DCIS, margins (-), LVI(-).  1 of 16 nodes was positive    07/29/2016 Miscellaneous   Mammaprint showed high risk disease, luminal type B   09/09/2016 - 11/12/2016 Adjuvant Chemotherapy   Docetaxel and Cytoxan (TC) every 3 weeks   12/24/2016 - 02/03/2017 Radiation Therapy   Adjuvant breast radiation 12/24/16 - 02/03/17 : Right Breast treated to 50.4 Gy in 28 fractions. Right Axilla treated to 45 Gy in 25 fractions.   02/17/2017 -  Anti-estrogen oral therapy   Letrozole 2.5 mg daily    03/23/2017 Imaging   DG Bone Density 03/23/17 ASSESSMENT: The BMD measured at Femur Neck Right is 0.809 g/cm2 with a T-score of -1.6. This patient is considered osteopenic according to [facility]) criteria.   09/08/2017 Mammogram   Diagnostic Mammogram 09/08/17  IMPRESSION: No mammographic evidence of malignancy in either breast, status post right lumpectomy. RECOMMENDATION: Diagnostic mammogram is suggested in 1 year. (Code:[id number])   09/09/2018 Mammogram   09/10/2019 Mammogram IMPRESSION: No mammographic evidence of malignancy.   Uterine cancer ([place])   08/25/2019 Imaging   US pelvis Heterogeneous mass in the fundus of the uterus as well as a mass in the region of the cervix and vagina. These are felt to represent underlying neoplasm. Direct visualization is recommended as well as possible MRI of the pelvis for further delineation.   09/01/2019 Pathology Results   Endocervical biopsy - HIGH-GRADE ENDOMETRIOID ADENOCARCINOMA. SEE NOTE Immunohistochemical stains show that the tumor cells are diffusely positive for p16, p53 and vimentin. Tumor cells show focal staining for ER and are negative for monoclonal CEA. This immunophenotype is consistent with a gynecologic primary.  ER 40% positive   09/08/2019 Imaging   Ct scan of chest, abdomen and pelvis showed: 1. Acute bilateral pulmonary embolism with large saddle embolus. Dilated main pulmonary artery suggesting pulmonary hypertension. Patient will be brought immediately to the emergency department at [facility] from the lobby of the radiology department per instructions of Dr. Denman Arnold. 2. Infiltrative poorly marginated heterogeneously enhancing 7.0 x 4.3 x 5.0 cm midline pelvic mass centered in the region of the vaginal fornix with involvement of the uterine cervix and with intimate association with the anterior rectal wall, suspicious for malignancy of uncertain primary site. 3. Extensive lymphadenopathy throughout the mediastinum, retroperitoneum and bilateral pelvis, likely metastatic adenopathy. 4. Enlarged uterus with apparent 7.5 cm anterior uterine body solid mass, nonspecific, potentially large uterine fibroid. No adnexal masses.  No ascites. 5. Cholelithiasis. Nonspecific gas in the fundal gallbladder. Consider short-term follow-up CT abdomen to exclude emphysematous cholecystitis. No biliary ductal dilatation. 6. Marked colonic diverticulosis. 7.  Aortic Atherosclerosis (ICD10-I70.0).   09/08/2019 - 09/12/2019 Hospital Admission   She was admitted to the hospital for management of  severe PE.  Her anticoagulation therapy was discontinued and switched to Lovenox   09/14/2019 Cancer Staging   Staging form: Corpus Uteri - Carcinoma and Carcinosarcoma, AJCC 8th Edition - Clinical stage from 09/14/2019: FIGO Stage IVB (cT3, cN2, pM1) - Signed by Heath Lark, MD on 09/14/2019   09/21/2019 Procedure   Successful left arm power PICC line placement with ultrasound and fluoroscopic guidance. The catheter is ready for use.     REVIEW OF SYSTEMS:   Constitutional: Denies fevers, chills or abnormal weight loss Eyes: Denies blurriness of vision Ears, nose, mouth, throat, and face: Denies mucositis or sore throat Respiratory: Denies cough, dyspnea or wheezes Cardiovascular: Denies palpitation, chest discomfort or lower extremity swelling Gastrointestinal:  Denies nausea, heartburn or change in bowel habits Skin: Denies abnormal skin rashes Lymphatics: Denies new lymphadenopathy or easy bruising Neurological:Denies numbness, tingling or new weaknesses Behavioral/Psych: Mood is stable, no new changes  All other systems were reviewed with the patient and are negative.  I have reviewed the past medical history, past surgical history, social history and family history with the patient and they are unchanged from previous note.  ALLERGIES:  is allergic to other.  MEDICATIONS:  Current Outpatient Medications  Medication Sig Dispense Refill  . amLODipine (NORVASC) 5 MG tablet Take 1 tablet (5 mg total) by mouth daily. 30 tablet 0  . cephALEXin (KEFLEX) 500 MG capsule Take 1 capsule (500 mg total) by mouth 2 (two) times daily. 20 capsule 0  . cetirizine (ZYRTEC) 10 MG tablet Take 10 mg by mouth every evening.     . Cholecalciferol (VITAMIN D-3) 125 MCG (5000 UT) TABS Take 5,000 Units by mouth daily.    . diphenoxylate-atropine (LOMOTIL) 2.5-0.025 MG tablet TAKE ONE TABLET FOUR TIMES DAILY AS NEEDED FOR DIARRHEA OR LOOSE STOOLS 30 tablet 0  . enoxaparin (LOVENOX) 100 MG/ML injection  Inject 1 mL (100 mg total) into the skin every 12 (twelve) hours. 60 mL 1  . exemestane (AROMASIN) 25 MG tablet Take 1 tablet (25 mg total) by mouth daily. 30 tablet 6  . ferrous sulfate 325 (65 FE) MG tablet Take 325 mg by mouth daily with breakfast.    . lisinopril (ZESTRIL) 20 MG tablet Take 1 tablet (20 mg total) by mouth daily. 90 tablet 3  . loperamide (IMODIUM A-D) 2 MG tablet Take 2 mg by mouth 4 (four) times daily as needed.    . memantine (NAMENDA) 10 MG tablet TAKE ONE TABLET BY MOUTH TWICE DAILY (Patient taking differently: Take 10 mg by mouth 2 (two) times daily. ) 60 tablet 5  . nitrofurantoin (MACRODANTIN) 100 MG capsule Take 100 mg by mouth 2 (two) times daily.    . ondansetron (ZOFRAN) 8 MG tablet Take 1 tablet (8 mg total) by mouth every 8 (eight) hours as needed. 30 tablet 1  . Probiotic Product (PROBIOTIC PO) Take 1 capsule by mouth daily.     . prochlorperazine (COMPAZINE) 10 MG tablet Take 1 tablet (10 mg total) by mouth every 6 (six) hours as needed (Nausea or vomiting). 30 tablet 1  . rosuvastatin (CRESTOR) 10 MG tablet TAKE ONE TABLET EVERY DAY 30 tablet 2  . sertraline (ZOLOFT) 100 MG tablet  Take 1 tablet (100 mg total) by mouth daily. 90 tablet 3  . Sodium Chloride Flush (NORMAL SALINE FLUSH) 0.9 % SOLN Inject 10 ml daily to each lumen, double lumen PICC so please dispense 60 prefilled syringes 600 mL 0   No current facility-administered medications for this visit.     PHYSICAL EXAMINATION: ECOG PERFORMANCE STATUS: 2 - Symptomatic, <50% confined to bed  Vitals:   09/26/19 0934  BP: (!) 170/100  Pulse: 90  Resp: 18  Temp: 98.3 F (36.8 C)   Filed Weights   09/26/19 0934  Weight: 226 lb 6.4 oz (102.7 kg)    GENERAL:alert, no distress and comfortable SKIN: She has mild erythema on the right leg, improved compared to previous visit EYES: normal, Conjunctiva are pink and non-injected, sclera clear OROPHARYNX:no exudate, no erythema and lips, buccal mucosa,  and tongue normal  NECK: supple, thyroid normal size, non-tender, without nodularity LYMPH:  no palpable lymphadenopathy in the cervical, axillary or inguinal LUNGS: clear to auscultation and percussion with normal breathing effort HEART: regular rate & rhythm and no murmurs with bilateral lower extremity edema ABDOMEN:abdomen soft, non-tender and normal bowel sounds Musculoskeletal:no cyanosis of digits and no clubbing  NEURO: alert & oriented x 3 with fluent speech, no focal motor/sensory deficits  LABORATORY DATA:  I have reviewed the data as listed    Component Value Date/Time   NA 140 09/26/2019 0855   NA 140 09/22/2017 1003   K 4.1 09/26/2019 0855   K 3.8 09/22/2017 1003   CL 104 09/26/2019 0855   CO2 27 09/26/2019 0855   CO2 29 09/22/2017 1003   GLUCOSE 188 (H) 09/26/2019 0855   GLUCOSE 116 09/22/2017 1003   BUN 19 09/26/2019 0855   BUN 22.4 09/22/2017 1003   CREATININE 0.75 09/26/2019 0855   CREATININE 0.71 07/02/2018 1514   CREATININE 0.8 09/22/2017 1003   CALCIUM 9.2 09/26/2019 0855   CALCIUM 10.0 09/22/2017 1003   PROT 7.4 09/26/2019 0855   PROT 7.1 09/22/2017 1003   ALBUMIN 3.2 (L) 09/26/2019 0855   ALBUMIN 3.1 (L) 09/22/2017 1003   AST 17 09/26/2019 0855   AST 14 09/22/2017 1003   ALT 17 09/26/2019 0855   ALT 9 09/22/2017 1003   ALKPHOS 91 09/26/2019 0855   ALKPHOS 126 09/22/2017 1003   BILITOT 0.2 (L) 09/26/2019 0855   BILITOT 0.24 09/22/2017 1003   GFRNONAA >60 09/26/2019 0855   GFRAA >60 09/26/2019 0855    No results found for: SPEP, UPEP  Lab Results  Component Value Date   WBC 7.3 09/26/2019   NEUTROABS 5.5 09/26/2019   HGB 11.1 (L) 09/26/2019   HCT 37.6 09/26/2019   MCV 94.7 09/26/2019   PLT 308 09/26/2019      Chemistry      Component Value Date/Time   NA 140 09/26/2019 0855   NA 140 09/22/2017 1003   K 4.1 09/26/2019 0855   K 3.8 09/22/2017 1003   CL 104 09/26/2019 0855   CO2 27 09/26/2019 0855   CO2 29 09/22/2017 1003   BUN 19  09/26/2019 0855   BUN 22.4 09/22/2017 1003   CREATININE 0.75 09/26/2019 0855   CREATININE 0.71 07/02/2018 1514   CREATININE 0.8 09/22/2017 1003      Component Value Date/Time   CALCIUM 9.2 09/26/2019 0855   CALCIUM 10.0 09/22/2017 1003   ALKPHOS 91 09/26/2019 0855   ALKPHOS 126 09/22/2017 1003   AST 17 09/26/2019 0855   AST 14 09/22/2017 1003  ALT 17 09/26/2019 0855   ALT 9 09/22/2017 1003   BILITOT 0.2 (L) 09/26/2019 0855   BILITOT 0.24 09/22/2017 1003       RADIOGRAPHIC STUDIES: I have personally reviewed the radiological images as listed and agreed with the findings in the report. Dg Abd 1 View  Result Date: 09/11/2019 CLINICAL DATA:  Constipation.  Abdominal pain. EXAM: ABDOMEN - 1 VIEW COMPARISON:  None. FINDINGS: Normal bowel gas pattern.  No significant increase in colonic stool. 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 There is a right common iliac artery stent. Soft tissues otherwise unremarkable. No acute skeletal abnormality. IMPRESSION: 1. No acute findings.  No evidence of bowel obstruction. 2. No radiographic evidence of increased colonic stool. Electronically Signed   By: Lajean Manes M.D.   On: 09/11/2019 10:43   Ct Head Wo Contrast  Result Date: 09/08/2019 CLINICAL DATA:  [place] in bathroom recent CT with positive PE EXAM: CT HEAD WITHOUT CONTRAST TECHNIQUE: Contiguous axial images were obtained from the base of the skull through the vertex without intravenous contrast. COMPARISON:  CT 09/08/2019, 04/13/2019 FINDINGS: Brain: No acute territorial infarction, hemorrhage or intracranial mass. Atrophy and moderate hypodensity in the white matter consistent with small vessel ischemic change. Stable ventricle size Vascular: No hyperdense vessels.  Carotid vascular calcification Skull: Normal. Negative for fracture or focal lesion. Sinuses/Orbits: Postsurgical changes of the maxillary and ethmoid sinuses with mild mucosal thickening. Other: Small left forehead soft  tissue swelling IMPRESSION: 1. No CT evidence for acute intracranial abnormality. 2. Atrophy and small vessel ischemic changes of white matter. Electronically Signed   By: Donavan Foil M.D.   On: 09/08/2019 19:11   Ct Chest W Contrast  Result Date: 09/08/2019 CLINICAL DATA:  Uterine and cervical mass on exam. Gyn malignancy is suspected. Staging evaluation. History of right breast cancer. Dyspnea. EXAM: CT CHEST, ABDOMEN, AND PELVIS WITH CONTRAST TECHNIQUE: Multidetector CT imaging of the chest, abdomen and pelvis was performed following the standard protocol during bolus administration of intravenous contrast. CONTRAST:  129m OMNIPAQUE IOHEXOL 300 MG/ML  SOLN COMPARISON:  07/14/2019 chest radiograph. FINDINGS: CT CHEST FINDINGS Cardiovascular: Normal heart size. No significant pericardial effusion/thickening. Atherosclerotic nonaneurysmal thoracic aorta. Dilated main pulmonary artery (3.5 cm diameter). There is acute bilateral pulmonary embolism including a large saddle embolus. RV/LV ratio 0.90. Mediastinum/Nodes: No discrete thyroid nodules. Unremarkable esophagus. Surgical clips in the right axilla. No pathologically enlarged axillary nodes. Right paratracheal adenopathy up to 2.0 cm short axis diameter (series 2/image 18). Enlarged 2.1 cm AP window node (series 2/image 20). Left prevascular adenopathy up to 1.1 cm (series 2/image 17). Enlarged 1.1 cm left posterior mediastinal node along the descending aorta (series 2/image 26). Bilateral retrocrural adenopathy measuring up to 1.3 cm on the right (series 2/image 46). Lungs/Pleura: No pneumothorax. No pleural effusion. No acute consolidative airspace disease, lung masses or significant pulmonary nodules. Musculoskeletal: No aggressive appearing focal osseous lesions. Marked thoracic spondylosis. CT ABDOMEN PELVIS FINDINGS Hepatobiliary: Normal liver size. Subcentimeter posterior right liver hypodense lesion is too small to characterize (series 2/image 45).  No additional liver lesions. Cholelithiasis. Scattered gas within the contracted fundal gallbladder. No definite gallbladder wall thickening or pericholecystic fluid. No biliary ductal dilatation. Pancreas: Normal, with no mass or duct dilation. Spleen: Normal size. No mass. Adrenals/Urinary Tract: No discrete adrenal nodules. Mild fullness of the central right renal collecting system without overt right hydronephrosis. No left hydronephrosis. Symmetric contrast nephrograms. Scattered subcentimeter hypodense renal cortical lesions in both kidneys  are too small to characterize and require no follow-up. Small parapelvic renal cysts in the left kidney. Normal bladder. Stomach/Bowel: Normal non-distended stomach. Normal caliber small bowel with no small bowel wall thickening. Normal appendix. Oral contrast transits to the left colon. Marked colonic diverticulosis, most prominent in the sigmoid colon, with no definite large bowel wall thickening or significant pericolonic fat stranding. Vascular/Lymphatic: Atherosclerotic nonaneurysmal abdominal aorta. Patent portal, splenic, hepatic and renal veins. IVC filter in place within the infrarenal IVC. Right external iliac vein stent. Extensive retroperitoneal and bilateral pelvic lymphadenopathy involving aortocaval, left para-aortic, bilateral common iliac, bilateral external iliac and right inguinal nodal chains. Representative enlarged 1.8 cm right inguinal node (series 7/image 103), 2.0 cm right external iliac node (series 7/image 90), 2.1 cm right common iliac node (series 7/image 76), 2.0 cm posterior aortocaval node (series 7/image 64) and 1.6 cm left para-aortic node (series 7/image 62). Reproductive: Enlarged uterus. Infiltrative poorly marginated heterogeneously enhancing 7.0 x 4.3 x 5.0 cm midline pelvic mass centered in the region of the vaginal fornix with involvement of the uterine cervix and intimate association with the anterior rectal wall. Scattered gas in  the uterine cavity. Apparent 7.5 cm anterior uterine body solid mass, nonspecific, potentially a fibroid. No adnexal masses. Other: No pneumoperitoneum, ascites or focal fluid collection. Musculoskeletal: No aggressive appearing focal osseous lesions. 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 Chronic appearing osteolytic change at L3-4. IMPRESSION: 1. Acute bilateral pulmonary embolism with large saddle embolus. Dilated main pulmonary artery suggesting pulmonary hypertension. Patient will be brought immediately to the emergency department at [facility] from the lobby of the radiology department per instructions of Dr. Denman Arnold. 2. Infiltrative poorly marginated heterogeneously enhancing 7.0 x 4.3 x 5.0 cm midline pelvic mass centered in the region of the vaginal fornix with involvement of the uterine cervix and with intimate association with the anterior rectal wall, suspicious for malignancy of uncertain primary site. 3. Extensive lymphadenopathy throughout the mediastinum, retroperitoneum and bilateral pelvis, likely metastatic adenopathy. 4. Enlarged uterus with apparent 7.5 cm anterior uterine body solid mass, nonspecific, potentially large uterine fibroid. No adnexal masses. No ascites. 5. Cholelithiasis. Nonspecific gas in the fundal gallbladder. Consider short-term follow-up CT abdomen to exclude emphysematous cholecystitis. No biliary ductal dilatation. 6. Marked colonic diverticulosis. 7.  Aortic Atherosclerosis (ICD10-I70.0). Critical Value/emergent results were called by telephone at the time of interpretation on 09/08/2019 at 3:20 pm to provider Tiffany ROSSI, MD, who verbally acknowledged these results. Electronically Signed   By: Tiffany Arnold M.D.   On: 09/08/2019 15:46   Ct Abdomen Pelvis W Contrast  Result Date: 09/08/2019 CLINICAL DATA:  Uterine and cervical mass on exam. Gyn malignancy is suspected. Staging evaluation. History of right breast cancer.  Dyspnea. EXAM: CT CHEST, ABDOMEN, AND PELVIS WITH CONTRAST TECHNIQUE: Multidetector CT imaging of the chest, abdomen and pelvis was performed following the standard protocol during bolus administration of intravenous contrast. CONTRAST:  177m OMNIPAQUE IOHEXOL 300 MG/ML  SOLN COMPARISON:  07/14/2019 chest radiograph. FINDINGS: CT CHEST FINDINGS Cardiovascular: Normal heart size. No significant pericardial effusion/thickening. Atherosclerotic nonaneurysmal thoracic aorta. Dilated main pulmonary artery (3.5 cm diameter). There is acute bilateral pulmonary embolism including a large saddle embolus. RV/LV ratio 0.90. Mediastinum/Nodes: No discrete thyroid nodules. Unremarkable esophagus. Surgical clips in the right axilla. No pathologically enlarged axillary nodes. Right paratracheal adenopathy up to 2.0 cm short axis diameter (series 2/image 18). Enlarged 2.1 cm AP window node (series 2/image 20). Left prevascular adenopathy up to 1.1  cm (series 2/image 17). Enlarged 1.1 cm left posterior mediastinal node along the descending aorta (series 2/image 26). Bilateral retrocrural adenopathy measuring up to 1.3 cm on the right (series 2/image 46). Lungs/Pleura: No pneumothorax. No pleural effusion. No acute consolidative airspace disease, lung masses or significant pulmonary nodules. Musculoskeletal: No aggressive appearing focal osseous lesions. Marked thoracic spondylosis. CT ABDOMEN PELVIS FINDINGS Hepatobiliary: Normal liver size. Subcentimeter posterior right liver hypodense lesion is too small to characterize (series 2/image 45). No additional liver lesions. Cholelithiasis. Scattered gas within the contracted fundal gallbladder. No definite gallbladder wall thickening or pericholecystic fluid. No biliary ductal dilatation. Pancreas: Normal, with no mass or duct dilation. Spleen: Normal size. No mass. Adrenals/Urinary Tract: No discrete adrenal nodules. Mild fullness of the central right renal collecting system without  overt right hydronephrosis. No left hydronephrosis. Symmetric contrast nephrograms. Scattered subcentimeter hypodense renal cortical lesions in both kidneys are too small to characterize and require no follow-up. Small parapelvic renal cysts in the left kidney. Normal bladder. Stomach/Bowel: Normal non-distended stomach. Normal caliber small bowel with no small bowel wall thickening. Normal appendix. Oral contrast transits to the left colon. Marked colonic diverticulosis, most prominent in the sigmoid colon, with no definite large bowel wall thickening or significant pericolonic fat stranding. Vascular/Lymphatic: Atherosclerotic nonaneurysmal abdominal aorta. Patent portal, splenic, hepatic and renal veins. IVC filter in place within the infrarenal IVC. Right external iliac vein stent. Extensive retroperitoneal and bilateral pelvic lymphadenopathy involving aortocaval, left para-aortic, bilateral common iliac, bilateral external iliac and right inguinal nodal chains. Representative enlarged 1.8 cm right inguinal node (series 7/image 103), 2.0 cm right external iliac node (series 7/image 90), 2.1 cm right common iliac node (series 7/image 76), 2.0 cm posterior aortocaval node (series 7/image 64) and 1.6 cm left para-aortic node (series 7/image 62). Reproductive: Enlarged uterus. Infiltrative poorly marginated heterogeneously enhancing 7.0 x 4.3 x 5.0 cm midline pelvic mass centered in the region of the vaginal fornix with involvement of the uterine cervix and intimate association with the anterior rectal wall. Scattered gas in the uterine cavity. Apparent 7.5 cm anterior uterine body solid mass, nonspecific, potentially a fibroid. No adnexal masses. Other: No pneumoperitoneum, ascites or focal fluid collection. Musculoskeletal: No aggressive appearing focal osseous lesions. 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 Chronic appearing osteolytic change at L3-4. IMPRESSION: 1.  Acute bilateral pulmonary embolism with large saddle embolus. Dilated main pulmonary artery suggesting pulmonary hypertension. Patient will be brought immediately to the emergency department at [facility] from the lobby of the radiology department per instructions of Dr. Denman Arnold. 2. Infiltrative poorly marginated heterogeneously enhancing 7.0 x 4.3 x 5.0 cm midline pelvic mass centered in the region of the vaginal fornix with involvement of the uterine cervix and with intimate association with the anterior rectal wall, suspicious for malignancy of uncertain primary site. 3. Extensive lymphadenopathy throughout the mediastinum, retroperitoneum and bilateral pelvis, likely metastatic adenopathy. 4. Enlarged uterus with apparent 7.5 cm anterior uterine body solid mass, nonspecific, potentially large uterine fibroid. No adnexal masses. No ascites. 5. Cholelithiasis. Nonspecific gas in the fundal gallbladder. Consider short-term follow-up CT abdomen to exclude emphysematous cholecystitis. No biliary ductal dilatation. 6. Marked colonic diverticulosis. 7.  Aortic Atherosclerosis (ICD10-I70.0). Critical Value/emergent results were called by telephone at the time of interpretation on 09/08/2019 at 3:20 pm to provider Tiffany ROSSI, MD, who verbally acknowledged these results. Electronically Signed   By: Tiffany Arnold M.D.   On: 09/08/2019 15:46   Dg Chest [facility]  Result Date: 09/11/2019 CLINICAL DATA:  Dyspnea. History of breast cancer, hypertension, hyperlipidemia, diabetes, asthma. EXAM: PORTABLE CHEST 1 VIEW COMPARISON:  Chest x-rays dated 07/14/2019 and 04/27/2017. Chest CT dated 09/08/2019. FINDINGS: Heart size and mediastinal contours are stable. Lungs are clear. No pleural effusion or pneumothorax is seen. IMPRESSION: No evidence of pneumonia or pulmonary edema. Electronically Signed   By: Tiffany Arnold M.D.   On: 09/11/2019 09:04   Vas [place] Lower Extremity Venous (dvt)  Result Date: 09/05/2019  Lower  Venous Study Indications: Edema.  Risk Factors: DVT Rt LE DVT Surgery 08/15/2019: Inferior Venocavogram; Placement of a Denali IVC Filter Infrarenal. Mechanical Thrombectomy of the Right CFV, Right External iliac Vein and Right Common Eliac vein. Comparison Study: 08/15/2019 Performing Technologist: Almira Coaster RVS  Examination Guidelines: A complete evaluation includes B-mode imaging, spectral Doppler, color Doppler, and power Doppler as needed of all accessible portions of each vessel. Bilateral testing is considered an integral part of a complete examination. Limited examinations for reoccurring indications may be performed as noted.  +---------+---------------+---------+-----------+----------+--------------+ RIGHT    CompressibilityPhasicitySpontaneityPropertiesThrombus Aging +---------+---------------+---------+-----------+----------+--------------+ CFV      Full           Yes      Yes                                 +---------+---------------+---------+-----------+----------+--------------+ SFJ      Full           Yes      Yes                                 +---------+---------------+---------+-----------+----------+--------------+ FV Prox  None           No       No                                  +---------+---------------+---------+-----------+----------+--------------+ FV Mid   None           No       No                                  +---------+---------------+---------+-----------+----------+--------------+ FV DistalNone           No       No                                  +---------+---------------+---------+-----------+----------+--------------+ PFV      Full           Yes      Yes                                 +---------+---------------+---------+-----------+----------+--------------+ POP      None           No       No                                  +---------+---------------+---------+-----------+----------+--------------+ PTV       Full  Yes      Yes                                 +---------+---------------+---------+-----------+----------+--------------+ PERO     Full           Yes      Yes                                 +---------+---------------+---------+-----------+----------+--------------+ GSV      Full           Yes      Yes                                 +---------+---------------+---------+-----------+----------+--------------+   +----+---------------+---------+-----------+----------+--------------+ LEFTCompressibilityPhasicitySpontaneityPropertiesThrombus Aging +----+---------------+---------+-----------+----------+--------------+ CFV Full           Yes      Yes                                 +----+---------------+---------+-----------+----------+--------------+     Summary: Right: Findings consistent with acute deep vein thrombosis involving the right femoral vein, and right popliteal vein. Essentially Unchanged, the Right CFV appears to no evidence of DVT.  *See table(s) above for measurements and observations. Electronically signed by Tiffany Pilar MD on 09/05/2019 at 5:26:00 PM.    Final    Irpicc Placement Left >5 Yrs Inc Img Guide  Result Date: 09/21/2019 INDICATION: Patient with prior history of breast cancer, recent pulmonary embolism and now with advanced metastatic uterine cancer; request received for central venous access for chemotherapy. EXAM: LEFT UPPER EXTREMITY PICC LINE PLACEMENT WITH ULTRASOUND AND FLUOROSCOPIC GUIDANCE MEDICATIONS: 1% lidocaine to the skin and subcutaneous tissue ANESTHESIA/SEDATION: None FLUOROSCOPY TIME:  Fluoroscopy Time: 30 seconds (13 mGy). COMPLICATIONS: None immediate. PROCEDURE: The patient was advised of the possible risks and complications and agreed to undergo the procedure. The patient was then brought to the angiographic suite for the procedure. The left arm was prepped with chlorhexidine, draped in the usual sterile fashion using  maximum barrier technique (cap and mask, sterile gown, sterile gloves, large sterile sheet, hand hygiene and cutaneous antisepsis) and infiltrated locally with 1% Lidocaine. Ultrasound demonstrated patency of the left basilic vein, and this was documented with an image. Under real-time ultrasound guidance, this vein was accessed with a 21 gauge micropuncture needle and image documentation was performed. A 0.018 wire was introduced in to the vein. Over this, a 5 [place] single lumen power injectable PICC was advanced to the lower SVC/right atrial junction. Fluoroscopy during the procedure and fluoro spot radiograph confirms appropriate catheter position. The catheter was flushed and covered with a sterile dressing. Catheter length: 51 cm IMPRESSION: Successful left arm power PICC line placement with ultrasound and fluoroscopic guidance. The catheter is ready for use. Read by: Rowe Robert, PA-C Electronically Signed   By: Aletta Edouard M.D.   On: 09/21/2019 15:55    All questions were answered. The patient knows to call the clinic with any problems, questions or concerns. No barriers to learning was detected.  I spent 40 minutes counseling the patient face to face. The total time spent in the appointment was 55 minutes and more than 50% was on counseling and review of test results  Heath Lark, MD 09/27/2019  8:25 AM

## 2019-09-27 NOTE — Telephone Encounter (Signed)
[facility] [phone]).  Message left requesting a return call for chemotherapy follow up.  Awaiting return call from patient.

## 2019-09-27 NOTE — Telephone Encounter (Signed)
-----   Message from Teodoro Spray, RN sent at 09/26/2019  1:38 PM EDT ----- Regarding: Dr. Alvy Bimler first time chemo followup Dr. Jacklynn Lewis Patient first time carboplatin follow-up phone call. Patient tolerated treatment well.  Patient has memory issues. Sister is HCPOA for patient. Sister is listed in patient contact info.

## 2019-09-27 NOTE — Assessment & Plan Note (Signed)
She has significant memory impairment at baseline She is oriented in person but not fully in place or time In my opinion, she is not capable to make full informed decision about treatment decisions After extensive discussion with her sister, she is in agreement to accompany the patient for all her future appointment for safety reasons

## 2019-09-27 NOTE — Telephone Encounter (Signed)
Adam @ [employer] is aware pt has appointment 10/26 and this will be filled out after appointment

## 2019-09-27 NOTE — Assessment & Plan Note (Signed)
Her sister is her dedicated healthcare power of attorney She understood that goals of care is strictly palliative in nature

## 2019-09-28 DIAGNOSIS — I10 Essential (primary) hypertension: Secondary | ICD-10-CM | POA: Diagnosis not present

## 2019-09-28 DIAGNOSIS — F0280 Dementia in other diseases classified elsewhere without behavioral disturbance: Secondary | ICD-10-CM | POA: Diagnosis not present

## 2019-09-28 DIAGNOSIS — J4530 Mild persistent asthma, uncomplicated: Secondary | ICD-10-CM | POA: Diagnosis not present

## 2019-09-28 DIAGNOSIS — E119 Type 2 diabetes mellitus without complications: Secondary | ICD-10-CM | POA: Diagnosis not present

## 2019-09-28 DIAGNOSIS — I824Z1 Acute embolism and thrombosis of unspecified deep veins of right distal lower extremity: Secondary | ICD-10-CM | POA: Diagnosis not present

## 2019-09-28 DIAGNOSIS — F411 Generalized anxiety disorder: Secondary | ICD-10-CM | POA: Diagnosis not present

## 2019-09-28 DIAGNOSIS — C50911 Malignant neoplasm of unspecified site of right female breast: Secondary | ICD-10-CM | POA: Diagnosis not present

## 2019-09-28 DIAGNOSIS — G300 Alzheimer's disease with early onset: Secondary | ICD-10-CM | POA: Diagnosis not present

## 2019-09-28 DIAGNOSIS — E785 Hyperlipidemia, unspecified: Secondary | ICD-10-CM | POA: Diagnosis not present

## 2019-09-29 DIAGNOSIS — I824Z1 Acute embolism and thrombosis of unspecified deep veins of right distal lower extremity: Secondary | ICD-10-CM | POA: Diagnosis not present

## 2019-09-29 DIAGNOSIS — E119 Type 2 diabetes mellitus without complications: Secondary | ICD-10-CM | POA: Diagnosis not present

## 2019-09-29 DIAGNOSIS — C50911 Malignant neoplasm of unspecified site of right female breast: Secondary | ICD-10-CM | POA: Diagnosis not present

## 2019-09-29 DIAGNOSIS — E785 Hyperlipidemia, unspecified: Secondary | ICD-10-CM | POA: Diagnosis not present

## 2019-09-29 DIAGNOSIS — F0280 Dementia in other diseases classified elsewhere without behavioral disturbance: Secondary | ICD-10-CM | POA: Diagnosis not present

## 2019-09-29 DIAGNOSIS — F411 Generalized anxiety disorder: Secondary | ICD-10-CM | POA: Diagnosis not present

## 2019-09-29 DIAGNOSIS — J4530 Mild persistent asthma, uncomplicated: Secondary | ICD-10-CM | POA: Diagnosis not present

## 2019-09-29 DIAGNOSIS — G300 Alzheimer's disease with early onset: Secondary | ICD-10-CM | POA: Diagnosis not present

## 2019-09-29 DIAGNOSIS — I10 Essential (primary) hypertension: Secondary | ICD-10-CM | POA: Diagnosis not present

## 2019-10-03 ENCOUNTER — Inpatient Hospital Stay: Payer: Medicare PPO

## 2019-10-03 ENCOUNTER — Other Ambulatory Visit: Payer: Self-pay

## 2019-10-03 DIAGNOSIS — Z452 Encounter for adjustment and management of vascular access device: Secondary | ICD-10-CM | POA: Diagnosis not present

## 2019-10-03 DIAGNOSIS — C55 Malignant neoplasm of uterus, part unspecified: Secondary | ICD-10-CM | POA: Diagnosis not present

## 2019-10-03 DIAGNOSIS — Z170 Estrogen receptor positive status [ER+]: Secondary | ICD-10-CM | POA: Diagnosis not present

## 2019-10-03 DIAGNOSIS — L03115 Cellulitis of right lower limb: Secondary | ICD-10-CM | POA: Diagnosis not present

## 2019-10-03 DIAGNOSIS — Z5111 Encounter for antineoplastic chemotherapy: Secondary | ICD-10-CM | POA: Diagnosis not present

## 2019-10-03 DIAGNOSIS — R634 Abnormal weight loss: Secondary | ICD-10-CM | POA: Diagnosis not present

## 2019-10-03 DIAGNOSIS — C50311 Malignant neoplasm of lower-inner quadrant of right female breast: Secondary | ICD-10-CM | POA: Diagnosis not present

## 2019-10-03 DIAGNOSIS — Z7189 Other specified counseling: Secondary | ICD-10-CM

## 2019-10-03 DIAGNOSIS — Z95828 Presence of other vascular implants and grafts: Secondary | ICD-10-CM

## 2019-10-03 DIAGNOSIS — I2699 Other pulmonary embolism without acute cor pulmonale: Secondary | ICD-10-CM | POA: Diagnosis not present

## 2019-10-03 DIAGNOSIS — K5730 Diverticulosis of large intestine without perforation or abscess without bleeding: Secondary | ICD-10-CM | POA: Diagnosis not present

## 2019-10-03 MED ORDER — HEPARIN SOD (PORK) LOCK FLUSH 100 UNIT/ML IV SOLN
500.0000 [IU] | Freq: Once | INTRAVENOUS | Status: AC
  Administered 2019-10-03: 250 [IU]
  Filled 2019-10-03: qty 5

## 2019-10-03 MED ORDER — SODIUM CHLORIDE 0.9% FLUSH
10.0000 mL | Freq: Once | INTRAVENOUS | Status: AC
  Administered 2019-10-03: 10 mL
  Filled 2019-10-03: qty 10

## 2019-10-05 DIAGNOSIS — F0280 Dementia in other diseases classified elsewhere without behavioral disturbance: Secondary | ICD-10-CM | POA: Diagnosis not present

## 2019-10-05 DIAGNOSIS — G300 Alzheimer's disease with early onset: Secondary | ICD-10-CM | POA: Diagnosis not present

## 2019-10-05 DIAGNOSIS — J4530 Mild persistent asthma, uncomplicated: Secondary | ICD-10-CM | POA: Diagnosis not present

## 2019-10-05 DIAGNOSIS — E119 Type 2 diabetes mellitus without complications: Secondary | ICD-10-CM | POA: Diagnosis not present

## 2019-10-05 DIAGNOSIS — I10 Essential (primary) hypertension: Secondary | ICD-10-CM | POA: Diagnosis not present

## 2019-10-05 DIAGNOSIS — F411 Generalized anxiety disorder: Secondary | ICD-10-CM | POA: Diagnosis not present

## 2019-10-05 DIAGNOSIS — E785 Hyperlipidemia, unspecified: Secondary | ICD-10-CM | POA: Diagnosis not present

## 2019-10-05 DIAGNOSIS — I824Z1 Acute embolism and thrombosis of unspecified deep veins of right distal lower extremity: Secondary | ICD-10-CM | POA: Diagnosis not present

## 2019-10-05 DIAGNOSIS — C50911 Malignant neoplasm of unspecified site of right female breast: Secondary | ICD-10-CM | POA: Diagnosis not present

## 2019-10-06 DIAGNOSIS — C50911 Malignant neoplasm of unspecified site of right female breast: Secondary | ICD-10-CM | POA: Diagnosis not present

## 2019-10-06 DIAGNOSIS — F411 Generalized anxiety disorder: Secondary | ICD-10-CM | POA: Diagnosis not present

## 2019-10-06 DIAGNOSIS — J4530 Mild persistent asthma, uncomplicated: Secondary | ICD-10-CM | POA: Diagnosis not present

## 2019-10-06 DIAGNOSIS — E785 Hyperlipidemia, unspecified: Secondary | ICD-10-CM | POA: Diagnosis not present

## 2019-10-06 DIAGNOSIS — F0280 Dementia in other diseases classified elsewhere without behavioral disturbance: Secondary | ICD-10-CM | POA: Diagnosis not present

## 2019-10-06 DIAGNOSIS — I10 Essential (primary) hypertension: Secondary | ICD-10-CM | POA: Diagnosis not present

## 2019-10-06 DIAGNOSIS — G300 Alzheimer's disease with early onset: Secondary | ICD-10-CM | POA: Diagnosis not present

## 2019-10-06 DIAGNOSIS — E119 Type 2 diabetes mellitus without complications: Secondary | ICD-10-CM | POA: Diagnosis not present

## 2019-10-06 DIAGNOSIS — I824Z1 Acute embolism and thrombosis of unspecified deep veins of right distal lower extremity: Secondary | ICD-10-CM | POA: Diagnosis not present

## 2019-10-10 ENCOUNTER — Other Ambulatory Visit: Payer: Self-pay

## 2019-10-10 ENCOUNTER — Inpatient Hospital Stay: Payer: Medicare PPO

## 2019-10-10 DIAGNOSIS — C50311 Malignant neoplasm of lower-inner quadrant of right female breast: Secondary | ICD-10-CM | POA: Diagnosis not present

## 2019-10-10 DIAGNOSIS — R634 Abnormal weight loss: Secondary | ICD-10-CM | POA: Diagnosis not present

## 2019-10-10 DIAGNOSIS — C55 Malignant neoplasm of uterus, part unspecified: Secondary | ICD-10-CM

## 2019-10-10 DIAGNOSIS — Z95828 Presence of other vascular implants and grafts: Secondary | ICD-10-CM

## 2019-10-10 DIAGNOSIS — Z452 Encounter for adjustment and management of vascular access device: Secondary | ICD-10-CM | POA: Diagnosis not present

## 2019-10-10 DIAGNOSIS — K5730 Diverticulosis of large intestine without perforation or abscess without bleeding: Secondary | ICD-10-CM | POA: Diagnosis not present

## 2019-10-10 DIAGNOSIS — Z170 Estrogen receptor positive status [ER+]: Secondary | ICD-10-CM | POA: Diagnosis not present

## 2019-10-10 DIAGNOSIS — Z7189 Other specified counseling: Secondary | ICD-10-CM

## 2019-10-10 DIAGNOSIS — I2699 Other pulmonary embolism without acute cor pulmonale: Secondary | ICD-10-CM | POA: Diagnosis not present

## 2019-10-10 DIAGNOSIS — L03115 Cellulitis of right lower limb: Secondary | ICD-10-CM | POA: Diagnosis not present

## 2019-10-10 DIAGNOSIS — Z5111 Encounter for antineoplastic chemotherapy: Secondary | ICD-10-CM | POA: Diagnosis not present

## 2019-10-10 MED ORDER — SODIUM CHLORIDE 0.9% FLUSH
10.0000 mL | Freq: Once | INTRAVENOUS | Status: AC
  Administered 2019-10-10: 10 mL
  Filled 2019-10-10: qty 10

## 2019-10-10 MED ORDER — HEPARIN SOD (PORK) LOCK FLUSH 100 UNIT/ML IV SOLN
500.0000 [IU] | Freq: Once | INTRAVENOUS | Status: AC
  Administered 2019-10-10: 250 [IU]
  Filled 2019-10-10: qty 5

## 2019-10-12 DIAGNOSIS — F0280 Dementia in other diseases classified elsewhere without behavioral disturbance: Secondary | ICD-10-CM | POA: Diagnosis not present

## 2019-10-12 DIAGNOSIS — J4530 Mild persistent asthma, uncomplicated: Secondary | ICD-10-CM | POA: Diagnosis not present

## 2019-10-12 DIAGNOSIS — E785 Hyperlipidemia, unspecified: Secondary | ICD-10-CM | POA: Diagnosis not present

## 2019-10-12 DIAGNOSIS — G300 Alzheimer's disease with early onset: Secondary | ICD-10-CM | POA: Diagnosis not present

## 2019-10-12 DIAGNOSIS — F411 Generalized anxiety disorder: Secondary | ICD-10-CM | POA: Diagnosis not present

## 2019-10-12 DIAGNOSIS — I10 Essential (primary) hypertension: Secondary | ICD-10-CM | POA: Diagnosis not present

## 2019-10-12 DIAGNOSIS — C50911 Malignant neoplasm of unspecified site of right female breast: Secondary | ICD-10-CM | POA: Diagnosis not present

## 2019-10-12 DIAGNOSIS — I824Z1 Acute embolism and thrombosis of unspecified deep veins of right distal lower extremity: Secondary | ICD-10-CM | POA: Diagnosis not present

## 2019-10-12 DIAGNOSIS — E119 Type 2 diabetes mellitus without complications: Secondary | ICD-10-CM | POA: Diagnosis not present

## 2019-10-13 DIAGNOSIS — I824Z1 Acute embolism and thrombosis of unspecified deep veins of right distal lower extremity: Secondary | ICD-10-CM | POA: Diagnosis not present

## 2019-10-13 DIAGNOSIS — C50911 Malignant neoplasm of unspecified site of right female breast: Secondary | ICD-10-CM | POA: Diagnosis not present

## 2019-10-13 DIAGNOSIS — E119 Type 2 diabetes mellitus without complications: Secondary | ICD-10-CM | POA: Diagnosis not present

## 2019-10-13 DIAGNOSIS — F0280 Dementia in other diseases classified elsewhere without behavioral disturbance: Secondary | ICD-10-CM | POA: Diagnosis not present

## 2019-10-13 DIAGNOSIS — I10 Essential (primary) hypertension: Secondary | ICD-10-CM | POA: Diagnosis not present

## 2019-10-13 DIAGNOSIS — G300 Alzheimer's disease with early onset: Secondary | ICD-10-CM | POA: Diagnosis not present

## 2019-10-13 DIAGNOSIS — F411 Generalized anxiety disorder: Secondary | ICD-10-CM | POA: Diagnosis not present

## 2019-10-13 DIAGNOSIS — E785 Hyperlipidemia, unspecified: Secondary | ICD-10-CM | POA: Diagnosis not present

## 2019-10-13 DIAGNOSIS — J4530 Mild persistent asthma, uncomplicated: Secondary | ICD-10-CM | POA: Diagnosis not present

## 2019-10-14 ENCOUNTER — Telehealth: Payer: Self-pay

## 2019-10-14 ENCOUNTER — Other Ambulatory Visit: Payer: Self-pay

## 2019-10-14 ENCOUNTER — Encounter (INDEPENDENT_AMBULATORY_CARE_PROVIDER_SITE_OTHER): Payer: Self-pay | Admitting: Nurse Practitioner

## 2019-10-14 ENCOUNTER — Ambulatory Visit (INDEPENDENT_AMBULATORY_CARE_PROVIDER_SITE_OTHER): Payer: Medicare PPO | Admitting: Nurse Practitioner

## 2019-10-14 VITALS — BP 187/108

## 2019-10-14 DIAGNOSIS — C55 Malignant neoplasm of uterus, part unspecified: Secondary | ICD-10-CM | POA: Diagnosis not present

## 2019-10-14 DIAGNOSIS — E7800 Pure hypercholesterolemia, unspecified: Secondary | ICD-10-CM | POA: Diagnosis not present

## 2019-10-14 DIAGNOSIS — I82401 Acute embolism and thrombosis of unspecified deep veins of right lower extremity: Secondary | ICD-10-CM

## 2019-10-14 MED ORDER — ENOXAPARIN SODIUM 100 MG/ML ~~LOC~~ SOLN: 100.0000 mg | Freq: Two times a day (BID) | SUBCUTANEOUS | 0 refills | Status: DC

## 2019-10-14 NOTE — Telephone Encounter (Signed)
Joelene Millin with [facility], called asking for verbal orders to see patient for Home health PT 2 x for 2 weeks, and 1 x for 2 weeks. CB [phone].

## 2019-10-16 NOTE — Telephone Encounter (Signed)
Ok for HH as requested.

## 2019-10-17 ENCOUNTER — Encounter: Payer: Self-pay | Admitting: Internal Medicine

## 2019-10-17 ENCOUNTER — Ambulatory Visit (INDEPENDENT_AMBULATORY_CARE_PROVIDER_SITE_OTHER): Payer: Medicare PPO | Admitting: Internal Medicine

## 2019-10-17 ENCOUNTER — Other Ambulatory Visit: Payer: Self-pay

## 2019-10-17 VITALS — BP 150/98 | HR 73 | Temp 98.5°F | Wt 225.0 lb

## 2019-10-17 DIAGNOSIS — C50911 Malignant neoplasm of unspecified site of right female breast: Secondary | ICD-10-CM | POA: Diagnosis not present

## 2019-10-17 DIAGNOSIS — C531 Malignant neoplasm of exocervix: Secondary | ICD-10-CM | POA: Diagnosis not present

## 2019-10-17 DIAGNOSIS — G300 Alzheimer's disease with early onset: Secondary | ICD-10-CM | POA: Diagnosis not present

## 2019-10-17 DIAGNOSIS — I824Z1 Acute embolism and thrombosis of unspecified deep veins of right distal lower extremity: Secondary | ICD-10-CM | POA: Diagnosis not present

## 2019-10-17 DIAGNOSIS — F411 Generalized anxiety disorder: Secondary | ICD-10-CM | POA: Diagnosis not present

## 2019-10-17 DIAGNOSIS — I10 Essential (primary) hypertension: Secondary | ICD-10-CM

## 2019-10-17 DIAGNOSIS — J4530 Mild persistent asthma, uncomplicated: Secondary | ICD-10-CM | POA: Diagnosis not present

## 2019-10-17 DIAGNOSIS — E785 Hyperlipidemia, unspecified: Secondary | ICD-10-CM | POA: Diagnosis not present

## 2019-10-17 DIAGNOSIS — F0280 Dementia in other diseases classified elsewhere without behavioral disturbance: Secondary | ICD-10-CM | POA: Diagnosis not present

## 2019-10-17 DIAGNOSIS — I2699 Other pulmonary embolism without acute cor pulmonale: Secondary | ICD-10-CM

## 2019-10-17 DIAGNOSIS — E119 Type 2 diabetes mellitus without complications: Secondary | ICD-10-CM | POA: Diagnosis not present

## 2019-10-17 DIAGNOSIS — Z0279 Encounter for issue of other medical certificate: Secondary | ICD-10-CM

## 2019-10-17 NOTE — Progress Notes (Signed)
Subjective:    Patient ID: Tiffany Arnold, female    DOB: Feb 22, 1941, 78 y.o.   MRN: [id number]  HPI  Pt presents to the clinic today for hospital follow up. She went to the ER 9/17 after she was found to have an incidental PE in her lungs during imaging for the workup of her newly diagnosed uterine cancer. Her Eliquis was stopped. She was placed on a Heparin drip and then transition to Lovenox injections. They did start her on Amlodipine for better blood pressure control. Since discharge, she reports she is doing well. She did not know she had a PE. She was never SOB or had a cough. Her BP today is 150/98. She denies headaches, dizziness or visual changes. She follows up with her oncologist tomorrow. She is unsure if they are going to keep her on Lovenox or not.  Review of Systems      Past Medical History:  Diagnosis Date  . Allergic rhinitis, seasonal   . Arm bruise    left arm down to hand from fall 01-05-2019  . Bilateral renal cysts    simple (monitored by dr Karsten Ro)  . Chronic cystitis    urologist-- dr Karsten Ro  . GAD (generalized anxiety disorder)   . History of cancer chemotherapy 09-09-2016  to 11-12-2016   breast cancer  . History of external beam radiation therapy 12-24-2016  to 02-03-2017   right breast  . History of recurrent UTIs   . Humerus fracture 01/05/2019   left ,  hx total shoulder arthroplasty   . Hyperlipidemia   . Hypertension   . IBS (irritable bowel syndrome)    mixed  . Lymphedema of right upper extremity   . 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 Memory disorder 06/22/2017   with intermittant  confusion;  neurologist-- dr Krista Blue (note in epic)  . Mild persistent asthma    no inhaler  . OA (osteoarthritis)   . OSA on CPAP   . Type 2 diabetes, diet controlled ([place])   . Urge incontinence of urine   . Wears glasses     Current Outpatient Medications  Medication Sig Dispense Refill  . amLODipine (NORVASC) 5 MG tablet Take 1 tablet (5 mg total) by mouth daily. 30 tablet 0  . cephALEXin (KEFLEX) 500 MG capsule Take 1 capsule (500 mg total) by mouth 2 (two) times daily. 20 capsule 0  . cetirizine (ZYRTEC) 10 MG tablet Take 10 mg by mouth every evening.     . Cholecalciferol (VITAMIN D-3) 125 MCG (5000 UT) TABS Take 5,000 Units by mouth daily.    . diphenoxylate-atropine (LOMOTIL) 2.5-0.025 MG tablet TAKE ONE TABLET FOUR TIMES DAILY AS NEEDED FOR DIARRHEA OR LOOSE STOOLS 30 tablet 0  . enoxaparin (LOVENOX) 100 MG/ML injection Inject 1 mL (100 mg total) into the skin every 12 (twelve) hours for 4 days. 8 mL 0  . exemestane (AROMASIN) 25 MG tablet Take 1 tablet (25 mg total) by mouth daily. 30 tablet 6  . ferrous sulfate 325 (65 FE) MG tablet Take 325 mg by mouth daily with breakfast.    . lisinopril (ZESTRIL)  20 MG tablet Take 1 tablet (20 mg total) by mouth daily. 90 tablet 3  . loperamide (IMODIUM A-D) 2 MG tablet Take 2 mg by mouth 4 (four) times daily as needed.    . memantine (NAMENDA) 10 MG tablet TAKE ONE TABLET BY MOUTH TWICE DAILY (Patient taking differently: Take 10 mg by mouth 2 (two) times daily. ) 60 tablet 5  . nitrofurantoin (MACRODANTIN) 100 MG capsule Take 100 mg by mouth 2 (two) times daily.    . ondansetron (ZOFRAN) 8 MG tablet Take 1 tablet (8 mg total) by mouth every 8 (eight) hours as needed. 30 tablet 1  . Probiotic Product (PROBIOTIC PO) Take 1 capsule by mouth daily.     . prochlorperazine (COMPAZINE) 10 MG tablet Take 1 tablet (10 mg total) by mouth every 6 (six) hours as needed (Nausea or vomiting). 30 tablet 1  . rosuvastatin (CRESTOR) 10 MG tablet TAKE ONE TABLET  EVERY DAY 30 tablet 2  . sertraline (ZOLOFT) 100 MG tablet Take 1 tablet (100 mg total) by mouth daily. 90 tablet 3  . Sodium Chloride Flush (NORMAL SALINE FLUSH) 0.9 % SOLN Inject 10 ml daily to each lumen, double lumen PICC so please dispense 60 prefilled syringes 600 mL 0   No current facility-administered medications for this visit.     Allergies  Allergen Reactions  . Other Shortness Of Breath, Swelling and Other (See Comments)    Cats, dogs, mold Induced asthma Cats, dogs, mold    Family History  Problem Relation Age of Onset  . Arthritis Mother   . Stroke Mother   . Hypertension Mother   . Cancer Father        Prostate  . Stroke Father   . Hypertension Father   . Hypertension Maternal Grandmother   . Rheum arthritis Maternal Grandfather   . Stroke Maternal Grandfather   . Hypertension Maternal Grandfather   . Cancer Paternal Grandmother        Colon  . Hypertension Paternal Grandmother   . Hypertension Paternal Grandfather   . Breast cancer Neg Hx     Social History   Socioeconomic History  . Marital status: Married    Spouse name: Not on file  . Number of children: 2  . Years of education: 2 years college  . Highest education level: Not on file  Occupational History  . Occupation: Retired  Scientific laboratory technician  . Financial resource strain: Not on file  . Food insecurity    Worry: Not on file    Inability: Not on file  . Transportation needs    Medical: Not on file    Non-medical: Not on file  Tobacco Use  . Smoking status: Former Smoker    Packs/day: 2.00    Years: 25.00    Pack years: 50.00    Quit date: 12/23/1991    Years since quitting: 27.8  . Smokeless tobacco: Never Used  Substance and Sexual Activity  . Alcohol use: Not Currently  . Drug use: No  . Sexual activity: Not Currently  Lifestyle  . Physical activity    Days per week: Not on file    Minutes per session: Not on file  . Stress: Not on file  Relationships  . Social Product manager on phone: Not on file    Gets together: Not on file    Attends religious service: Not on file    Active member of club or organization: Not on file  Attends meetings of clubs or organizations: Not on file    Relationship status: Not on file  . Intimate partner violence    Fear of current or ex partner: Not on file    Emotionally abused: Not on file    Physically abused: Not on file    Forced sexual activity: Not on file  Other Topics Concern  . Not on file  Social History Narrative   Lives at home with her husband.   Right-handed.   Occasional caffeine use.     Constitutional: Pt reports fatigue. Denies fever, malaise, headache or abrupt weight changes.  Respiratory: Denies difficulty breathing, shortness of breath, cough or sputum production.   Cardiovascular: Pt reports some lower extremity edema. Denies chest pain, chest tightness, palpitations or swelling in the hands.  Gastrointestinal: Denies abdominal pain, bloating, constipation, diarrhea or blood in the stool.  GU: Pt reports vaginal spotting. Denies urgency, frequency, pain with urination, burning sensation, blood in urine, odor or discharge. Musculoskeletal: Pt reports difficulty with gait. Denies decrease in range of motion, muscle pain or joint pain and swelling.  Skin: Denies redness, rashes, lesions or ulcercations.  Neurological: Pt reports difficulty with memory. Denies dizziness,  difficulty with speech or problems with balance and coordination.    No other specific complaints in a complete review of systems (except as listed in HPI above).  Objective:   Physical Exam  BP (!) 150/98   Pulse 73   Temp 98.5 F (36.9 C) (Temporal)   Wt 225 lb (102.1 kg)   SpO2 96%   BMI 43.94 kg/m  Wt Readings from Last 3 Encounters:  10/18/19 225 lb 12.8 oz (102.4 kg)  10/17/19 225 lb (102.1 kg)  09/26/19 226 lb 6.4 oz (102.7 kg)    General: Appears her stated age, obese, in NAD. Cardiovascular: Normal rate  and rhythm. S1,S2 noted.  No murmur, rubs or gallops noted. 1+ RLE edema.  Pulmonary/Chest: Normal effort and positive vesicular breath sounds. No respiratory distress. No wheezes, rales or ronchi noted.  Abdomen: Soft and nontender. Normal bowel sounds. Musculoskeletal: Gait slow and steady with use of rolling walker. Neurological: Alert to person.   BMET    Component Value Date/Time   NA 140 10/18/2019 1111   NA 140 09/22/2017 1003   K 4.4 10/18/2019 1111   K 3.8 09/22/2017 1003   CL 103 10/18/2019 1111   CO2 27 10/18/2019 1111   CO2 29 09/22/2017 1003   GLUCOSE 229 (H) 10/18/2019 1111   GLUCOSE 116 09/22/2017 1003   BUN 19 10/18/2019 1111   BUN 22.4 09/22/2017 1003   CREATININE 0.75 10/18/2019 1111   CREATININE 0.71 07/02/2018 1514   CREATININE 0.8 09/22/2017 1003   CALCIUM 9.7 10/18/2019 1111   CALCIUM 10.0 09/22/2017 1003   GFRNONAA >60 10/18/2019 1111   GFRAA >60 10/18/2019 1111    Lipid Panel     Component Value Date/Time   CHOL 232 (H) 07/14/2019 1519   TRIG 271.0 (H) 07/14/2019 1519   HDL 35.00 (L) 07/14/2019 1519   CHOLHDL 7 07/14/2019 1519   VLDL 54.2 (H) 07/14/2019 1519   LDLCALC 130 (H) 07/02/2018 1514    CBC    Component Value Date/Time   WBC 3.8 (L) 10/18/2019 1111   WBC 9.2 09/12/2019 0335   RBC 4.04 10/18/2019 1111   HGB 11.7 (L) 10/18/2019 1111   HGB 10.3 (L) 09/22/2017 1003   HCT 38.5 10/18/2019 1111   HCT 31.9 (L) 09/22/2017 1003   PLT  235 10/18/2019 1111   PLT 350 09/22/2017 1003   MCV 95.3 10/18/2019 1111   MCV 80.6 09/22/2017 1003   MCH 29.0 10/18/2019 1111   MCHC 30.4 10/18/2019 1111   RDW 17.4 (H) 10/18/2019 1111   RDW 20.1 (H) 09/22/2017 1003   LYMPHSABS 0.9 10/18/2019 1111   LYMPHSABS 1.5 09/22/2017 1003   MONOABS 0.3 10/18/2019 1111   MONOABS 0.4 09/22/2017 1003   EOSABS 0.0 10/18/2019 1111   EOSABS 0.1 09/22/2017 1003   BASOSABS 0.0 10/18/2019 1111   BASOSABS 0.0 09/22/2017 1003    Hgb A1C Lab Results  Component Value  Date   HGBA1C 8.1 (H) 07/14/2019            Assessment & Plan:   Hospital Follow Up for PE, HTN:  Hospital notes, labs and imaging reviewed She will continue Lovenox for now She will follow up with her oncologist tomorrow She will continue current antihypertensive therapy at this time No further intervention needed, will continue to monitor  Return precautions discussed [name], NP

## 2019-10-18 ENCOUNTER — Inpatient Hospital Stay: Payer: Medicare PPO

## 2019-10-18 ENCOUNTER — Inpatient Hospital Stay: Payer: Medicare PPO | Admitting: Hematology and Oncology

## 2019-10-18 ENCOUNTER — Encounter: Payer: Self-pay | Admitting: Hematology and Oncology

## 2019-10-18 ENCOUNTER — Other Ambulatory Visit: Payer: Self-pay

## 2019-10-18 ENCOUNTER — Encounter (INDEPENDENT_AMBULATORY_CARE_PROVIDER_SITE_OTHER): Payer: Self-pay | Admitting: Nurse Practitioner

## 2019-10-18 DIAGNOSIS — Z95828 Presence of other vascular implants and grafts: Secondary | ICD-10-CM

## 2019-10-18 DIAGNOSIS — D61818 Other pancytopenia: Secondary | ICD-10-CM | POA: Diagnosis not present

## 2019-10-18 DIAGNOSIS — C55 Malignant neoplasm of uterus, part unspecified: Secondary | ICD-10-CM

## 2019-10-18 DIAGNOSIS — Z7189 Other specified counseling: Secondary | ICD-10-CM

## 2019-10-18 DIAGNOSIS — Z170 Estrogen receptor positive status [ER+]: Secondary | ICD-10-CM | POA: Diagnosis not present

## 2019-10-18 DIAGNOSIS — K5730 Diverticulosis of large intestine without perforation or abscess without bleeding: Secondary | ICD-10-CM | POA: Diagnosis not present

## 2019-10-18 DIAGNOSIS — C50311 Malignant neoplasm of lower-inner quadrant of right female breast: Secondary | ICD-10-CM | POA: Diagnosis not present

## 2019-10-18 DIAGNOSIS — Z452 Encounter for adjustment and management of vascular access device: Secondary | ICD-10-CM | POA: Diagnosis not present

## 2019-10-18 DIAGNOSIS — R634 Abnormal weight loss: Secondary | ICD-10-CM | POA: Diagnosis not present

## 2019-10-18 DIAGNOSIS — Z5111 Encounter for antineoplastic chemotherapy: Secondary | ICD-10-CM | POA: Diagnosis not present

## 2019-10-18 DIAGNOSIS — I2692 Saddle embolus of pulmonary artery without acute cor pulmonale: Secondary | ICD-10-CM | POA: Diagnosis not present

## 2019-10-18 DIAGNOSIS — L03115 Cellulitis of right lower limb: Secondary | ICD-10-CM | POA: Diagnosis not present

## 2019-10-18 DIAGNOSIS — I2699 Other pulmonary embolism without acute cor pulmonale: Secondary | ICD-10-CM | POA: Diagnosis not present

## 2019-10-18 LAB — CMP (CANCER CENTER ONLY)
ALT: 19 U/L (ref 0–44)
AST: 16 U/L (ref 15–41)
Albumin: 3.3 g/dL — ABNORMAL LOW (ref 3.5–5.0)
Alkaline Phosphatase: 92 U/L (ref 38–126)
Anion gap: 10 (ref 5–15)
BUN: 19 mg/dL (ref 8–23)
CO2: 27 mmol/L (ref 22–32)
Calcium: 9.7 mg/dL (ref 8.9–10.3)
Chloride: 103 mmol/L (ref 98–111)
Creatinine: 0.75 mg/dL (ref 0.44–1.00)
GFR, Est AFR Am: >60 mL/min (ref >60)
GFR, Estimated: >60 mL/min (ref >60)
Glucose, Bld: 229 mg/dL — ABNORMAL HIGH (ref 70–99)
Potassium: 4.4 mmol/L (ref 3.5–5.1)
Sodium: 140 mmol/L (ref 135–145)
Total Bilirubin: 0.2 mg/dL — ABNORMAL LOW (ref 0.3–1.2)
Total Protein: 7.1 g/dL (ref 6.5–8.1)

## 2019-10-18 LAB — CBC WITH DIFFERENTIAL (CANCER CENTER ONLY)
Abs Immature Granulocytes: 0 10*3/uL (ref 0.00–0.07)
Basophils Absolute: 0 10*3/uL (ref 0.0–0.1)
Basophils Relative: 1 %
Eosinophils Absolute: 0 10*3/uL (ref 0.0–0.5)
Eosinophils Relative: 1 %
HCT: 38.5 % (ref 36.0–46.0)
Hemoglobin: 11.7 g/dL — ABNORMAL LOW (ref 12.0–15.0)
Immature Granulocytes: 0 %
Lymphocytes Relative: 25 %
Lymphs Abs: 0.9 10*3/uL (ref 0.7–4.0)
MCH: 29 pg (ref 26.0–34.0)
MCHC: 30.4 g/dL (ref 30.0–36.0)
MCV: 95.3 fL (ref 80.0–100.0)
Monocytes Absolute: 0.3 10*3/uL (ref 0.1–1.0)
Monocytes Relative: 9 %
Neutro Abs: 2.5 10*3/uL (ref 1.7–7.7)
Neutrophils Relative %: 64 %
Platelet Count: 235 10*3/uL (ref 150–400)
RBC: 4.04 MIL/uL (ref 3.87–5.11)
RDW: 17.4 % — ABNORMAL HIGH (ref 11.5–15.5)
WBC Count: 3.8 10*3/uL — ABNORMAL LOW (ref 4.0–10.5)
nRBC: 0 % (ref 0.0–0.2)

## 2019-10-18 MED ORDER — SODIUM CHLORIDE 0.9% FLUSH
10.0000 mL | Freq: Once | INTRAVENOUS | Status: AC
  Administered 2019-10-18: 10 mL
  Filled 2019-10-18: qty 10

## 2019-10-18 MED ORDER — PALONOSETRON HCL INJECTION 0.25 MG/5ML
0.2500 mg | Freq: Once | INTRAVENOUS | Status: AC
  Administered 2019-10-18: 13:00:00 0.25 mg via INTRAVENOUS

## 2019-10-18 MED ORDER — SODIUM CHLORIDE 0.9 % IV SOLN
Freq: Once | INTRAVENOUS | Status: AC
  Administered 2019-10-18: 13:00:00 via INTRAVENOUS
  Filled 2019-10-18: qty 5

## 2019-10-18 MED ORDER — SODIUM CHLORIDE 0.9% FLUSH
10.0000 mL | INTRAVENOUS | Status: DC | PRN
  Administered 2019-10-18: 16:00:00 10 mL
  Filled 2019-10-18: qty 10

## 2019-10-18 MED ORDER — SODIUM CHLORIDE 0.9 % IV SOLN
Freq: Once | INTRAVENOUS | Status: AC
  Administered 2019-10-18: 13:00:00 via INTRAVENOUS
  Filled 2019-10-18: qty 250

## 2019-10-18 MED ORDER — SODIUM CHLORIDE 0.9 % IV SOLN
529.0000 mg | Freq: Once | INTRAVENOUS | Status: AC
  Administered 2019-10-18: 14:00:00 530 mg via INTRAVENOUS
  Filled 2019-10-18: qty 53

## 2019-10-18 MED ORDER — PALONOSETRON HCL INJECTION 0.25 MG/5ML
INTRAVENOUS | Status: AC
  Filled 2019-10-18: qty 5

## 2019-10-18 MED ORDER — HEPARIN SOD (PORK) LOCK FLUSH 100 UNIT/ML IV SOLN
250.0000 [IU] | Freq: Once | INTRAVENOUS | Status: AC | PRN
  Administered 2019-10-18: 250 [IU]
  Filled 2019-10-18: qty 5

## 2019-10-18 MED ORDER — ENOXAPARIN SODIUM 100 MG/ML ~~LOC~~ SOLN: 100.0000 mg | Freq: Two times a day (BID) | SUBCUTANEOUS | 0 refills | Status: DC

## 2019-10-18 MED FILL — ENOXAPARIN SODIUM 100 MG/ML: 100 | 30 days supply | Qty: 60 | Fill #0

## 2019-10-18 NOTE — Telephone Encounter (Signed)
Paperwork faxed 10/26 Pt aware Copy for pt Copy for scan Copy for billing

## 2019-10-18 NOTE — Telephone Encounter (Signed)
Left message on voicemail with verbal orders

## 2019-10-18 NOTE — Progress Notes (Signed)
[place] OFFICE PROGRESS NOTE  Patient Care Team: [name], NP as PCP - General (Internal Medicine)  ASSESSMENT & PLAN:  Uterine cancer [facility]) So far, she tolerated single agent carboplatin well without major side effects The plan would be to complete minimum 3 cycles of chemotherapy before repeating imaging study In the meantime, she will continue to come back here weekly for PICC line dressing changes and flush I will see her prior to her next cycle of treatment   Pulmonary embolism ([place]) She tolerated anticoagulation therapy well without recent bleeding complications We discussed treatment options For now, Lovenox is still the best treatment available for cancer patient was diagnosed with DVT/PE and who had failed oral anticoagulation therapy I renew her prescription of Lovenox today  Pancytopenia, acquired ([place]) She has mild pancytopenia due to treatment but overall not symptomatic We will observe only for now.  We will continue chemotherapy at similar dose.   No orders of the defined types were placed in this encounter.   INTERVAL HISTORY: Please see below for problem oriented charting. She returns for further follow-up with her sister due to her baseline dementia She tolerated recent chemotherapy well No recent nausea or changes in bowel habits The patient denies any recent signs or symptoms of bleeding such as spontaneous epistaxis, hematuria or hematochezia. No recent infection, fever or chills  SUMMARY OF ONCOLOGIC HISTORY: Oncology History Overview Note  Uterine cancer  40% ER positive MSI stable    Breast cancer of lower-inner quadrant of right female breast ([place])  05/23/2016 Mammogram   Diagnostic mammogram and ultrasound showed suspicious architectural distortion within the right breast lower inner quadrant, measuring 1.3 cm, without sonographic corelate.   06/03/2016 Initial Biopsy   Right breast inferior lower quadrant core needle  biopsy showed atypical ductal hyperplasia with calcifications   06/09/2016 Receptors her2   Breast biopsy showed ER 100% positive, PR 100% positive, HER-2 negative, Ki-67 40%   06/09/2016 Initial Diagnosis   Breast cancer of lower-inner quadrant of right female breast ([place])   06/09/2016 Initial Biopsy   Right breast inner quadrant core needle biopsy showed invasive ductal carcinoma and DCIS, grade 1-2   06/17/2016 Initial Biopsy   Right axillary lymph node core needle biopsy showed metastatic carcinoma   06/17/2016 Receptors her2   Axillary node biopsy showed ER 100% positive, PR 95% positive, HER-2 negative   06/19/2016 Imaging   Bilateral breast MRI showed locations in the lower inner right breast, largest 2.7X1.3X1.6cm, biopsy clips in 2 of this areas. There are abnormal right axillary lymph nodes showing second cortical's, no evidence of malignancy in the left breast.   07/29/2016 Surgery   Right lumpectomy and ALND   07/29/2016 Pathology Results   Right lumpectomy showed G3 IDC, DCIS, margins (-), LVI(-).  1 of 16 nodes was positive    07/29/2016 Miscellaneous   Mammaprint showed high risk disease, luminal type B   09/09/2016 - 11/12/2016 Adjuvant Chemotherapy   Docetaxel and Cytoxan (TC) every 3 weeks   12/24/2016 - 02/03/2017 Radiation Therapy   Adjuvant breast radiation 12/24/16 - 02/03/17 : Right Breast treated to 50.4 Gy in 28 fractions. Right Axilla treated to 45 Gy in 25 fractions.   02/17/2017 -  Anti-estrogen oral therapy   Letrozole 2.5 mg daily    03/23/2017 Imaging   DG Bone Density 03/23/17 ASSESSMENT: The BMD measured at Femur Neck Right is 0.809 g/cm2 with a T-score of -1.6. This patient is considered osteopenic according to World  Health Organization [facility]) criteria.   09/08/2017 Mammogram   Diagnostic Mammogram 09/08/17  IMPRESSION: No mammographic evidence of malignancy in either breast, status post right lumpectomy. RECOMMENDATION: Diagnostic mammogram is suggested  in 1 year. (Code:[id number])   09/09/2018 Mammogram   09/10/2019 Mammogram IMPRESSION: No mammographic evidence of malignancy.   Uterine cancer ([place])  08/25/2019 Imaging   US pelvis Heterogeneous mass in the fundus of the uterus as well as a mass in the region of the cervix and vagina. These are felt to represent underlying neoplasm. Direct visualization is recommended as well as possible MRI of the pelvis for further delineation.   09/01/2019 Pathology Results   Endocervical biopsy - HIGH-GRADE ENDOMETRIOID ADENOCARCINOMA. SEE NOTE Immunohistochemical stains show that the tumor cells are diffusely positive for p16, p53 and vimentin. Tumor cells show focal staining for ER and are negative for monoclonal CEA. This immunophenotype is consistent with a gynecologic primary.  ER 40% positive   09/08/2019 Imaging   Ct scan of chest, abdomen and pelvis showed: 1. Acute bilateral pulmonary embolism with large saddle embolus. Dilated main pulmonary artery suggesting pulmonary hypertension. Patient will be brought immediately to the emergency department at [facility] from the lobby of the radiology department per instructions of Dr. Denman George. 2. Infiltrative poorly marginated heterogeneously enhancing 7.0 x 4.3 x 5.0 cm midline pelvic mass centered in the region of the vaginal fornix with involvement of the uterine cervix and with intimate association with the anterior rectal wall, suspicious for malignancy of uncertain primary site. 3. Extensive lymphadenopathy throughout the mediastinum, retroperitoneum and bilateral pelvis, likely metastatic adenopathy. 4. Enlarged uterus with apparent 7.5 cm anterior uterine body solid mass, nonspecific, potentially large uterine fibroid. No adnexal masses. No ascites. 5. Cholelithiasis. Nonspecific gas in the fundal gallbladder. Consider short-term follow-up CT abdomen to exclude emphysematous cholecystitis. No biliary ductal dilatation. 6. Marked colonic  diverticulosis. 7.  Aortic Atherosclerosis (ICD10-I70.0).   09/08/2019 - 09/12/2019 Hospital Admission   She was admitted to the hospital for management of severe PE.  Her anticoagulation therapy was discontinued and switched to Lovenox   09/14/2019 Cancer Staging   Staging form: Corpus Uteri - Carcinoma and Carcinosarcoma, AJCC 8th Edition - Clinical stage from 09/14/2019: FIGO Stage IVB (cT3, cN2, pM1) - Signed by Heath Lark, MD on 09/14/2019   09/21/2019 Procedure   Successful left arm power PICC line placement with ultrasound and fluoroscopic guidance. The catheter is ready for use.     REVIEW OF SYSTEMS:   Constitutional: Denies fevers, chills or abnormal weight loss Eyes: Denies blurriness of vision Ears, nose, mouth, throat, and face: Denies mucositis or sore throat Respiratory: Denies cough, dyspnea or wheezes Cardiovascular: Denies palpitation, chest discomfort or lower extremity swelling Gastrointestinal:  Denies nausea, heartburn or change in bowel habits Skin: Denies abnormal skin rashes Lymphatics: Denies new lymphadenopathy or easy bruising Neurological:Denies numbness, tingling or new weaknesses Behavioral/Psych: Mood is stable, no new changes  All other systems were reviewed with the patient and are negative.  I have reviewed the past medical history, past surgical history, social history and family history with the patient and they are unchanged from previous note.  ALLERGIES:  is allergic to other.  MEDICATIONS:  Current Outpatient Medications  Medication Sig Dispense Refill  . amLODipine (NORVASC) 5 MG tablet Take 1 tablet (5 mg total) by mouth daily. 30 tablet 0  . cetirizine (ZYRTEC) 10 MG tablet Take 10 mg by mouth every evening.     . Cholecalciferol (VITAMIN D-3) 125 MCG (  5000 UT) TABS Take 5,000 Units by mouth daily.    . diphenoxylate-atropine (LOMOTIL) 2.5-0.025 MG tablet TAKE ONE TABLET FOUR TIMES DAILY AS NEEDED FOR DIARRHEA OR LOOSE STOOLS 30 tablet 0   . enoxaparin (LOVENOX) 100 MG/ML injection Inject 1 mL (100 mg total) into the skin every 12 (twelve) hours. 60 mL 0  . exemestane (AROMASIN) 25 MG tablet Take 1 tablet (25 mg total) by mouth daily. 30 tablet 6  . ferrous sulfate 325 (65 FE) MG tablet Take 325 mg by mouth daily with breakfast.    . lisinopril (ZESTRIL) 20 MG tablet Take 1 tablet (20 mg total) by mouth daily. 90 tablet 3  . loperamide (IMODIUM A-D) 2 MG tablet Take 2 mg by mouth 4 (four) times daily as needed.    . memantine (NAMENDA) 10 MG tablet TAKE ONE TABLET BY MOUTH TWICE DAILY (Patient taking differently: Take 10 mg by mouth 2 (two) times daily. ) 60 tablet 5  . nitrofurantoin (MACRODANTIN) 100 MG capsule Take 100 mg by mouth 2 (two) times daily.    . ondansetron (ZOFRAN) 8 MG tablet Take 1 tablet (8 mg total) by mouth every 8 (eight) hours as needed. 30 tablet 1  . Probiotic Product (PROBIOTIC PO) Take 1 capsule by mouth daily.     . prochlorperazine (COMPAZINE) 10 MG tablet Take 1 tablet (10 mg total) by mouth every 6 (six) hours as needed (Nausea or vomiting). 30 tablet 1  . rosuvastatin (CRESTOR) 10 MG tablet TAKE ONE TABLET EVERY DAY 30 tablet 2  . sertraline (ZOLOFT) 100 MG tablet Take 1 tablet (100 mg total) by mouth daily. 90 tablet 3  . Sodium Chloride Flush (NORMAL SALINE FLUSH) 0.9 % SOLN Inject 10 ml daily to each lumen, double lumen PICC so please dispense 60 prefilled syringes 600 mL 0   No current facility-administered medications for this visit.    Facility-Administered Medications Ordered in Other Visits  Medication Dose Route Frequency Provider Last Rate Last Dose  . sodium chloride flush (NS) 0.9 % injection 10 mL  10 mL Intracatheter PRN Alvy Bimler, Decklin Weddington, MD   10 mL at 10/18/19 1533    PHYSICAL EXAMINATION: ECOG PERFORMANCE STATUS: 2 - Symptomatic, <50% confined to bed  Vitals:   10/18/19 1149  BP: (!) 147/69  Pulse: 71  Resp: 18  Temp: 98.3 F (36.8 C)  SpO2: 94%   Filed Weights   10/18/19  1149  Weight: 225 lb 12.8 oz (102.4 kg)    GENERAL:alert, no distress and comfortable.  Limited examination due to class III obesity SKIN: skin color, texture, turgor are normal, no rashes or significant lesions EYES: normal, Conjunctiva are pink and non-injected, sclera clear OROPHARYNX:no exudate, no erythema and lips, buccal mucosa, and tongue normal  NECK: supple, thyroid normal size, non-tender, without nodularity LYMPH:  no palpable lymphadenopathy in the cervical, axillary or inguinal LUNGS: clear to auscultation and percussion with normal breathing effort HEART: regular rate & rhythm and no murmurs and no lower extremity edema ABDOMEN:abdomen soft, non-tender and normal bowel sounds Musculoskeletal:no cyanosis of digits and no clubbing  NEURO: alert & oriented x 3 with fluent speech, no focal motor/sensory deficits  LABORATORY DATA:  I have reviewed the data as listed    Component Value Date/Time   NA 140 10/18/2019 1111   NA 140 09/22/2017 1003   K 4.4 10/18/2019 1111   K 3.8 09/22/2017 1003   CL 103 10/18/2019 1111   CO2 27 10/18/2019 1111   CO2  29 09/22/2017 1003   GLUCOSE 229 (H) 10/18/2019 1111   GLUCOSE 116 09/22/2017 1003   BUN 19 10/18/2019 1111   BUN 22.4 09/22/2017 1003   CREATININE 0.75 10/18/2019 1111   CREATININE 0.71 07/02/2018 1514   CREATININE 0.8 09/22/2017 1003   CALCIUM 9.7 10/18/2019 1111   CALCIUM 10.0 09/22/2017 1003   PROT 7.1 10/18/2019 1111   PROT 7.1 09/22/2017 1003   ALBUMIN 3.3 (L) 10/18/2019 1111   ALBUMIN 3.1 (L) 09/22/2017 1003   AST 16 10/18/2019 1111   AST 14 09/22/2017 1003   ALT 19 10/18/2019 1111   ALT 9 09/22/2017 1003   ALKPHOS 92 10/18/2019 1111   ALKPHOS 126 09/22/2017 1003   BILITOT 0.2 (L) 10/18/2019 1111   BILITOT 0.24 09/22/2017 1003   GFRNONAA >60 10/18/2019 1111   GFRAA >60 10/18/2019 1111    No results found for: SPEP, UPEP  Lab Results  Component Value Date   WBC 3.8 (L) 10/18/2019   NEUTROABS 2.5  10/18/2019   HGB 11.7 (L) 10/18/2019   HCT 38.5 10/18/2019   MCV 95.3 10/18/2019   PLT 235 10/18/2019      Chemistry      Component Value Date/Time   NA 140 10/18/2019 1111   NA 140 09/22/2017 1003   K 4.4 10/18/2019 1111   K 3.8 09/22/2017 1003   CL 103 10/18/2019 1111   CO2 27 10/18/2019 1111   CO2 29 09/22/2017 1003   BUN 19 10/18/2019 1111   BUN 22.4 09/22/2017 1003   CREATININE 0.75 10/18/2019 1111   CREATININE 0.71 07/02/2018 1514   CREATININE 0.8 09/22/2017 1003      Component Value Date/Time   CALCIUM 9.7 10/18/2019 1111   CALCIUM 10.0 09/22/2017 1003   ALKPHOS 92 10/18/2019 1111   ALKPHOS 126 09/22/2017 1003   AST 16 10/18/2019 1111   AST 14 09/22/2017 1003   ALT 19 10/18/2019 1111   ALT 9 09/22/2017 1003   BILITOT 0.2 (L) 10/18/2019 1111   BILITOT 0.24 09/22/2017 1003       RADIOGRAPHIC STUDIES: I have personally reviewed the radiological images as listed and agreed with the findings in the report. Irpicc Placement Left >5 Yrs Inc Img Guide  Result Date: 09/21/2019 INDICATION: Patient with prior history of breast cancer, recent pulmonary embolism and now with advanced metastatic uterine cancer; request received for central venous access for chemotherapy. EXAM: LEFT UPPER EXTREMITY PICC LINE PLACEMENT WITH ULTRASOUND AND FLUOROSCOPIC GUIDANCE MEDICATIONS: 1% lidocaine to the skin and subcutaneous tissue ANESTHESIA/SEDATION: None FLUOROSCOPY TIME:  Fluoroscopy Time: 30 seconds (13 mGy). COMPLICATIONS: None immediate. PROCEDURE: The patient was advised of the possible risks and complications and agreed to undergo the procedure. The patient was then brought to the angiographic suite for the procedure. The left arm was prepped with chlorhexidine, draped in the usual sterile fashion using maximum barrier technique (cap and mask, sterile gown, sterile gloves, large sterile sheet, hand hygiene and cutaneous antisepsis) and infiltrated locally with 1% Lidocaine. Ultrasound  demonstrated patency of the left basilic vein, and this was documented with an image. Under real-time ultrasound guidance, this vein was accessed with a 21 gauge micropuncture needle and image documentation was performed. A 0.018 wire was introduced in to the vein. Over this, a 5 [place] single lumen power injectable PICC was advanced to the lower SVC/right atrial junction. Fluoroscopy during the procedure and fluoro spot radiograph confirms appropriate catheter position. The catheter was flushed and covered with a sterile dressing. Catheter length:  51 cm IMPRESSION: Successful left arm power PICC line placement with ultrasound and fluoroscopic guidance. The catheter is ready for use. Read by: Rowe Robert, PA-C Electronically Signed   By: Aletta Edouard M.D.   On: 09/21/2019 15:55    All questions were answered. The patient knows to call the clinic with any problems, questions or concerns. No barriers to learning was detected.  I spent 15 minutes counseling the patient face to face. The total time spent in the appointment was 20 minutes and more than 50% was on counseling and review of test results  Heath Lark, MD 10/18/2019 4:36 PM

## 2019-10-18 NOTE — Assessment & Plan Note (Signed)
She has mild pancytopenia due to treatment but overall not symptomatic We will observe only for now.  We will continue chemotherapy at similar dose.

## 2019-10-18 NOTE — Assessment & Plan Note (Signed)
So far, she tolerated single agent carboplatin well without major side effects The plan would be to complete minimum 3 cycles of chemotherapy before repeating imaging study In the meantime, she will continue to come back here weekly for PICC line dressing changes and flush I will see her prior to her next cycle of treatment

## 2019-10-18 NOTE — Assessment & Plan Note (Signed)
She tolerated anticoagulation therapy well without recent bleeding complications We discussed treatment options For now, Lovenox is still the best treatment available for cancer patient was diagnosed with DVT/PE and who had failed oral anticoagulation therapy I renew her prescription of Lovenox today

## 2019-10-18 NOTE — Progress Notes (Signed)
SUBJECTIVE:  Patient ID: Tiffany Arnold, female    DOB: [birth year], 78 y.o.   MRN: [id number] Chief Complaint  Patient presents with   Follow-up    no studies 6 week f/u    HPI  Tiffany Arnold is a 78 y.o. female   Past Medical History:  Diagnosis Date   Allergic rhinitis, seasonal    Arm bruise    left arm down to hand from fall 01-05-2019   Bilateral renal cysts    simple (monitored by dr Karsten Ro)   Chronic cystitis    urologist-- dr Karsten Ro   GAD (generalized anxiety disorder)    History of cancer chemotherapy 09-09-2016  to 11-12-2016   breast cancer   History of external beam radiation therapy 12-24-2016  to 02-03-2017   right breast   History of recurrent UTIs    Humerus fracture 01/05/2019   left ,  hx total shoulder arthroplasty    Hyperlipidemia    Hypertension    IBS (irritable bowel syndrome)    mixed   Lymphedema of right upper extremity    Malignant neoplasm of lower-inner quadrant of right breast of female, estrogen receptor positive [facility])    ONCOLOGIST-- dr Burr Medico;   dx 06/ 2017,  Stage IIB (pT1c N1 cM0), Grade 1-2,  DCIS,  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/  07-29-2016  s/p  right lumpectomy w/ sln dissections (1 out of 16 positve),   completed chemo 11-12-2016,  completed radiation 02-03-2017,  started anti-estrogen therapy 02-17-2017   Memory disorder 06/22/2017   with intermittant confusion;  neurologist-- dr Krista Blue (note in epic)   Mild persistent asthma    no inhaler   OA (osteoarthritis)    OSA on CPAP    Type 2 diabetes, diet controlled ([place])    Urge incontinence of urine    Wears glasses     Past Surgical History:  Procedure Laterality Date   BREAST CYST EXCISION Right 2001   negative   BREAST LUMPECTOMY WITH NEEDLE LOCALIZATION AND AXILLARY LYMPH NODE DISSECTION Right 07/29/2016   Procedure: RIGHT BREAST LUMPECTOMY WITH DOUBLE NEEDLE LOCALIZATION AND COMPLETE RIGHT AXILLARY LYMPH NODE DISSECTION;  Surgeon: Fanny Skates, MD;   Location: [facility];  Service: General;  Laterality: Right;   BREAST SURGERY Left 2000   Biopsy   BUNIONECTOMY Bilateral 1998   great toe fusion on right foot   CATARACT EXTRACTION W/ INTRAOCULAR LENS  IMPLANT, BILATERAL  right,  fall 2019;  left 2017   COLONOSCOPY W/ POLYPECTOMY     LUMBAR SPINE SURGERY  1970s   NASAL SINUS SURGERY  2015   ORIF HUMERUS FRACTURE Left 01/27/2019   Procedure: OPEN REDUCTION INTERNAL FIXATION (ORIF) LEFT HUMERUS;  Surgeon: Justice Britain, MD;  Location: [facility];  Service: Orthopedics;  Laterality: Left;   PERIPHERAL VASCULAR THROMBECTOMY Right 08/15/2019   Procedure: PERIPHERAL VASCULAR THROMBECTOMY WITH IVC FILTER;  Surgeon: Katha Cabal, MD;  Location: [facility];  Service: Cardiovascular;  Laterality: Right;   PORTACATH PLACEMENT N/A 09/05/2016   Procedure: INSERTION PORT-A-CATH;  Surgeon: Fanny Skates, MD;  Location: [facility];  Service: General;  Laterality: N/A;   TOTAL KNEE ARTHROPLASTY Bilateral 2007   TOTAL SHOULDER REPLACEMENT Left 8119   UMBILICAL HERNIA REPAIR  04-06-2014   @[facility]     Social History   Socioeconomic History   Marital status: Married    Spouse name: Not on file   Number of children: 2   Years of education: 2 years college   Highest education level:  Not on file  Occupational History   Occupation: Retired  Scientist, product/process development strain: Not on file   Food insecurity    Worry: Not on file    Inability: Not on Lexicographer needs    Medical: Not on file    Non-medical: Not on file  Tobacco Use   Smoking status: Former Smoker    Packs/day: 2.00    Years: 25.00    Pack years: 50.00    Quit date: 12/23/1991    Years since quitting: 27.8   Smokeless tobacco: Never Used  Substance and Sexual Activity   Alcohol use: Not Currently   Drug use: No   Sexual activity: Not Currently  Lifestyle   Physical activity    Days per week: Not on file    Minutes per session: Not on file    Stress: Not on file  Relationships   Social connections    Talks on phone: Not on file    Gets together: Not on file    Attends religious service: Not on file    Active member of club or organization: Not on file    Attends meetings of clubs or organizations: Not on file    Relationship status: Not on file   Intimate partner violence    Fear of current or ex partner: Not on file    Emotionally abused: Not on file    Physically abused: Not on file    Forced sexual activity: Not on file  Other Topics Concern   Not on file  Social History Narrative   Lives at home with her husband.   Right-handed.   Occasional caffeine use.    Family History  Problem Relation Age of Onset   Arthritis Mother    Stroke Mother    Hypertension Mother    Cancer Father        Prostate   Stroke Father    Hypertension Father    Hypertension Maternal Grandmother    Rheum arthritis Maternal Grandfather    Stroke Maternal Grandfather    Hypertension Maternal Grandfather    Cancer Paternal Grandmother        Colon   Hypertension Paternal Grandmother    Hypertension Paternal Grandfather    Breast cancer Neg Hx     Allergies  Allergen Reactions   Other Shortness Of Breath, Swelling and Other (See Comments)    Cats, dogs, mold Induced asthma Cats, dogs, mold     Review of Systems   Review of Systems: Negative Unless Checked Constitutional: [] Weight loss  [] Fever  [] Chills Cardiac: [] Chest pain   []  Atrial Fibrillation  [] Palpitations   [] Shortness of breath when laying flat   [] Shortness of breath with exertion. [] Shortness of breath at rest Vascular:  [] Pain in legs with walking   [] Pain in legs with standing [] Pain in legs when laying flat   [] Claudication    [] Pain in feet when laying flat    [] History of DVT   [] Phlebitis   [] Swelling in legs   [] Varicose veins   [] Non-healing ulcers Pulmonary:   [] Uses home oxygen   [] Productive cough   [] Hemoptysis   [] Wheeze  [] COPD    [] Asthma Neurologic:  [] Dizziness   [] Seizures  [] Blackouts [] History of stroke   [] History of TIA  [] Aphasia   [] Temporary Blindness   [] Weakness or numbness in arm   [] Weakness or numbness in leg Musculoskeletal:   [] Joint swelling   [] Joint pain   [] Low back  pain  []  History of Knee Replacement [] Arthritis [] back Surgeries  []  Spinal Stenosis    Hematologic:  [] Easy bruising  [] Easy bleeding   [] Hypercoagulable state   [] Anemic Gastrointestinal:  [] Diarrhea   [] Vomiting  [] Gastroesophageal reflux/heartburn   [] Difficulty swallowing. [] Abdominal pain Genitourinary:  [] Chronic kidney disease   [] Difficult urination  [] Anuric   [] Blood in urine [] Frequent urination  [] Burning with urination   [] Hematuria Skin:  [] Rashes   [] Ulcers [] Wounds Psychological:  [] History of anxiety   []  History of major depression  []  Memory Difficulties      OBJECTIVE:   Physical Exam  BP (!) 187/108 (BP Location: Right Leg)   Gen: WD/WN, NAD Head: [place]/AT, No temporalis wasting.  Ear/Nose/Throat: Hearing grossly intact, nares w/o erythema or drainage Eyes: PER, EOMI, sclera nonicteric.  Neck: Supple, no masses.  No JVD.  Pulmonary:  Good air movement, no use of accessory muscles.  Cardiac: RRR Vascular:  Vessel Right Left  Radial Palpable Palpable  Brachial Palpable Palpable  Femoral Palpable Palpable  Popliteal Palpable Palpable  Dorsalis Pedis Palpable Palpable  Posterior Tibial Palpable Palpable   Gastrointestinal: soft, non-distended. No guarding/no peritoneal signs.  Musculoskeletal: M/S 5/5 throughout.  No deformity or atrophy.  Neurologic: Pain and light touch intact in extremities.  Symmetrical.  Speech is fluent. Motor exam as listed above. Psychiatric: Judgment intact, Mood & affect appropriate for pt's clinical situation. Dermatologic: No Venous rashes. No Ulcers Noted.  No changes consistent with cellulitis. Lymph : No Cervical lymphadenopathy, no lichenification or skin changes of  chronic lymphedema.       ASSESSMENT AND PLAN:  1. Pure hypercholesterolemia Continue statin as ordered and reviewed, no changes at this time   2. Malignant neoplasm of uterus, unspecified site [facility]) Currently the patient is still undergoing treatment for the malignancy that likely caused her extensive DVT and PE.  The patient is set to receive her last few treatments within the next 3 to 4 weeks.  Therefore, we will have the patient return in 6 weeks to discuss removal of IVC filter as well as venogram for known left iliac vein stenosis.  Currently the patient has no signs symptoms of possible occlusion or issues of worsening stenosis.  Patient is instructed to contact her office if the patient has extreme severe swelling on 1 side versus the other.  If it becomes extremely painful as it was last time she should report to the emergency room as she could begin develop a severe DVT.  3. 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  The patient is maintained with Lovenox for anticoagulation.  This is managed by her oncologist.  She is tolerating it well with little bleeding.  Patient advised to continue to wear medical grade 1 compression stockings on a daily basis to help control edema and postphlebitic symptoms.  She is also instructed to elevate during the day when she is not active, however she should strive for at least 30 minutes a day of activity.   Current Outpatient Medications on File Prior to Visit  Medication Sig Dispense Refill   cetirizine (ZYRTEC) 10 MG tablet Take 10 mg by mouth every evening.      Cholecalciferol (VITAMIN D-3) 125 MCG (5000 UT) TABS Take 5,000 Units by mouth daily.     diphenoxylate-atropine (LOMOTIL) 2.5-0.025 MG tablet TAKE ONE TABLET FOUR TIMES DAILY AS NEEDED FOR DIARRHEA OR LOOSE STOOLS 30 tablet 0  exemestane (AROMASIN) 25 MG tablet Take 1 tablet (25  mg total) by mouth daily. 30 tablet 6   ferrous sulfate 325 (65 FE) MG tablet Take 325 mg by mouth daily with breakfast.     lisinopril (ZESTRIL) 20 MG tablet Take 1 tablet (20 mg total) by mouth daily. 90 tablet 3   loperamide (IMODIUM A-D) 2 MG tablet Take 2 mg by mouth 4 (four) times daily as needed.     memantine (NAMENDA) 10 MG tablet TAKE ONE TABLET BY MOUTH TWICE DAILY (Patient taking differently: Take 10 mg by mouth 2 (two) times daily. ) 60 tablet 5   nitrofurantoin (MACRODANTIN) 100 MG capsule Take 100 mg by mouth 2 (two) times daily.     ondansetron (ZOFRAN) 8 MG tablet Take 1 tablet (8 mg total) by mouth every 8 (eight) hours as needed. 30 tablet 1   Probiotic Product (PROBIOTIC PO) Take 1 capsule by mouth daily.      prochlorperazine (COMPAZINE) 10 MG tablet Take 1 tablet (10 mg total) by mouth every 6 (six) hours as needed (Nausea or vomiting). 30 tablet 1   rosuvastatin (CRESTOR) 10 MG tablet TAKE ONE TABLET EVERY DAY 30 tablet 2   sertraline (ZOLOFT) 100 MG tablet Take 1 tablet (100 mg total) by mouth daily. 90 tablet 3   Sodium Chloride Flush (NORMAL SALINE FLUSH) 0.9 % SOLN Inject 10 ml daily to each lumen, double lumen PICC so please dispense 60 prefilled syringes 600 mL 0   amLODipine (NORVASC) 5 MG tablet Take 1 tablet (5 mg total) by mouth daily. 30 tablet 0   No current facility-administered medications on file prior to visit.     There are no Patient Instructions on file for this visit. No follow-ups on file.   Kris Hartmann, NP  This note was completed with Sales executive.  Any errors are purely unintentional.

## 2019-10-18 NOTE — Patient Instructions (Signed)
PICC Home Care Guide ° °A peripherally inserted central catheter (PICC) is a form of IV access that allows medicines and IV fluids to be quickly distributed throughout the body. The PICC is a long, thin, flexible tube (catheter) that is inserted into a vein in the upper arm. The catheter ends in a large vein in the chest (superior vena cava, or SVC). After the PICC is inserted, a chest X-ray may be done to make sure that it is in the correct place. °A PICC may be placed for different reasons, such as: °· To give medicines and liquid nutrition. °· To give IV fluids and blood products. °· If there is trouble placing a peripheral intravenous (PIV) catheter. °If taken care of properly, a PICC can remain in place for several months. Having a PICC can also allow a person to go home from the hospital sooner. Medicine and PICC care can be managed at home by a family member, caregiver, or home health care team. °What are the risks? °Generally, having a PICC is safe. However, problems may occur, including: °· A blood clot (thrombus) forming in or at the tip of the PICC. °· A blood clot forming in a vein (deep vein thrombosis) or traveling to the lung (pulmonary embolism). °· Inflammation of the vein (phlebitis) in which the PICC is placed. °· Infection. Central line associated blood stream infection (CLABSI) is a serious infection that often requires hospitalization. °· PICC movement (malposition). The PICC tip may move from its original position due to excessive physical activity, forceful coughing, sneezing, or vomiting. °· A break or cut in the PICC. It is important not to use scissors near the PICC. °· Nerve or tendon irritation or injury during PICC insertion. °How to take care of your PICC °Preventing problems °· You and any caregivers should wash your hands often with soap. Wash hands: °? Before touching the PICC line or the infusion device. °? Before changing a bandage (dressing). °· Flush the PICC as told by your  health care provider. Let your health care provider know right away if the PICC is hard to flush or does not flush. Do not use force to flush the PICC. °· Do not use a syringe that is less than 10 mL to flush the PICC. °· Avoid blood pressure checks on the arm in which the PICC is placed. °· Never pull or tug on the PICC. °· Do not take the PICC out yourself. Only a trained clinical professional should remove the PICC. °· Use clean and sterile supplies only. Keep the supplies in a dry place. Do not reuse needles, syringes, or any other supplies. Doing that can lead to infection. °· Keep pets and children away from your PICC line. °· Check the PICC insertion site every day for signs of infection. Check for: °? Leakage. °? Redness, swelling, or pain. °? Fluid or blood. °? Warmth. °? Pus or a bad smell. °PICC dressing care °· Keep your PICC bandage (dressing) clean and dry to prevent infection. °· Do not take baths, swim, or use a hot tub until your health care provider approves. Ask your health care provider if you can take showers. You may only be allowed to take sponge baths for bathing. When you are allowed to shower: °? Ask your health care provider to teach you how to wrap the PICC line. °? Cover the PICC line with clear plastic wrap and tape to keep it dry while showering. °· Follow instructions from your health care provider   about how to take care of your insertion site and dressing. Make sure you: °? Wash your hands with soap and water before you change your bandage (dressing). If soap and water are not available, use hand sanitizer. °? Change your dressing as told by your health care provider. °? Leave stitches (sutures), skin glue, or adhesive strips in place. These skin closures may need to stay in place for 2 weeks or longer. If adhesive strip edges start to loosen and curl up, you may trim the loose edges. Do not remove adhesive strips completely unless your health care provider tells you to do  that. °· Change your PICC dressing if it becomes loose or wet. °General instructions ° °· Carry your PICC identification card or wear a medical alert bracelet at all times. °· Keep the tube clamped at all times, unless it is being used. °· Carry a smooth-edge clamp with you at all times to place on the tube if it breaks. °· Do not use scissors or sharp objects near the tube. °· You may bend your arm and move it freely. If your PICC is near or at the bend of your elbow, avoid activity with repeated motion at the elbow. °· Avoid lifting heavy objects as told by your health care provider. °· Keep all follow-up visits as told by your health care provider. This is important. °Disposal of supplies °· Throw away any syringes in a disposal container that is meant for sharp items (sharps container). You can buy a sharps container from a pharmacy, or you can make one by using an empty hard plastic bottle with a cover. °· Place any used dressings or infusion bags into a plastic bag. Throw that bag in the trash. °Contact a health care provider if: °· You have pain in your arm, ear, face, or teeth. °· You have a fever or chills. °· You have redness, swelling, or pain around the insertion site. °· You have fluid or blood coming from the insertion site. °· Your insertion site feels warm to the touch. °· You have pus or a bad smell coming from the insertion site. °· Your skin feels hard and raised around the insertion site. °Get help right away if: °· Your PICC is accidentally pulled all the way out. If this happens, cover the insertion site with a bandage or gauze dressing. Do not throw the PICC away. Your health care provider will need to check it. °· Your PICC was tugged or pulled and has partially come out. Do not  push the PICC back in. °· You cannot flush the PICC, it is hard to flush, or the PICC leaks around the insertion site when it is flushed. °· You hear a "flushing" sound when the PICC is flushed. °· You feel your  heart racing or skipping beats. °· There is a hole or tear in the PICC. °· You have swelling in the arm in which the PICC was inserted. °· You have a red streak going up your arm from where the PICC was inserted. °Summary °· A peripherally inserted central catheter (PICC) is a long, thin, flexible tube (catheter) that is inserted into a vein in the upper arm. °· The PICC is inserted using a sterile technique by a specially trained nurse or physician. Only a trained clinical professional should remove it. °· Keep your PICC identification card with you at all times. °· Avoid blood pressure checks on the arm in which the PICC is placed. °· If cared for   properly, a PICC can remain in place for several months. Having a PICC can also allow a person to go home from the hospital sooner. °This information is not intended to replace advice given to you by your health care provider. Make sure you discuss any questions you have with your health care provider. °Document Released: 06/14/2003 Document Revised: 11/20/2017 Document Reviewed: 01/10/2017 °Elsevier Patient Education © 2020 [employer] ° °

## 2019-10-18 NOTE — Patient Instructions (Signed)
[facility] Discharge Instructions for Patients Receiving Chemotherapy  Today you received the following chemotherapy agents: Carboplatin.  To help prevent nausea and vomiting after your treatment, we encourage you to take your nausea medication as prescribed.   If you develop nausea and vomiting that is not controlled by your nausea medication, call the clinic.   BELOW ARE SYMPTOMS THAT SHOULD BE REPORTED IMMEDIATELY:  *FEVER GREATER THAN 100.5 F  *CHILLS WITH OR WITHOUT FEVER  NAUSEA AND VOMITING THAT IS NOT CONTROLLED WITH YOUR NAUSEA MEDICATION  *UNUSUAL SHORTNESS OF BREATH  *UNUSUAL BRUISING OR BLEEDING  TENDERNESS IN MOUTH AND THROAT WITH OR WITHOUT PRESENCE OF ULCERS  *URINARY PROBLEMS  *BOWEL PROBLEMS  UNUSUAL RASH Items with * indicate a potential emergency and should be followed up as soon as possible.  Feel free to call the clinic should you have any questions or concerns. The clinic phone number is [phone].  Please show the CHEMO ALERT CARD at check-in to the Emergency Department and triage nurse.     

## 2019-10-19 ENCOUNTER — Telehealth: Payer: Self-pay | Admitting: Hematology and Oncology

## 2019-10-19 DIAGNOSIS — F0280 Dementia in other diseases classified elsewhere without behavioral disturbance: Secondary | ICD-10-CM | POA: Diagnosis not present

## 2019-10-19 DIAGNOSIS — E119 Type 2 diabetes mellitus without complications: Secondary | ICD-10-CM | POA: Diagnosis not present

## 2019-10-19 DIAGNOSIS — G300 Alzheimer's disease with early onset: Secondary | ICD-10-CM | POA: Diagnosis not present

## 2019-10-19 DIAGNOSIS — E785 Hyperlipidemia, unspecified: Secondary | ICD-10-CM | POA: Diagnosis not present

## 2019-10-19 DIAGNOSIS — I824Z1 Acute embolism and thrombosis of unspecified deep veins of right distal lower extremity: Secondary | ICD-10-CM | POA: Diagnosis not present

## 2019-10-19 DIAGNOSIS — C50311 Malignant neoplasm of lower-inner quadrant of right female breast: Secondary | ICD-10-CM | POA: Diagnosis not present

## 2019-10-19 DIAGNOSIS — I10 Essential (primary) hypertension: Secondary | ICD-10-CM | POA: Diagnosis not present

## 2019-10-19 DIAGNOSIS — J4530 Mild persistent asthma, uncomplicated: Secondary | ICD-10-CM | POA: Diagnosis not present

## 2019-10-19 DIAGNOSIS — F411 Generalized anxiety disorder: Secondary | ICD-10-CM | POA: Diagnosis not present

## 2019-10-19 NOTE — Telephone Encounter (Signed)
Scheduled appt per 10/27 sch message - pt sister is aware of appts added

## 2019-10-21 DIAGNOSIS — G300 Alzheimer's disease with early onset: Secondary | ICD-10-CM | POA: Diagnosis not present

## 2019-10-21 DIAGNOSIS — I824Z1 Acute embolism and thrombosis of unspecified deep veins of right distal lower extremity: Secondary | ICD-10-CM | POA: Diagnosis not present

## 2019-10-21 DIAGNOSIS — J4530 Mild persistent asthma, uncomplicated: Secondary | ICD-10-CM | POA: Diagnosis not present

## 2019-10-21 DIAGNOSIS — C50311 Malignant neoplasm of lower-inner quadrant of right female breast: Secondary | ICD-10-CM | POA: Diagnosis not present

## 2019-10-21 DIAGNOSIS — E785 Hyperlipidemia, unspecified: Secondary | ICD-10-CM | POA: Diagnosis not present

## 2019-10-21 DIAGNOSIS — S42202A Unspecified fracture of upper end of left humerus, initial encounter for closed fracture: Secondary | ICD-10-CM | POA: Diagnosis not present

## 2019-10-21 DIAGNOSIS — F411 Generalized anxiety disorder: Secondary | ICD-10-CM | POA: Diagnosis not present

## 2019-10-21 DIAGNOSIS — E119 Type 2 diabetes mellitus without complications: Secondary | ICD-10-CM | POA: Diagnosis not present

## 2019-10-21 DIAGNOSIS — I10 Essential (primary) hypertension: Secondary | ICD-10-CM | POA: Diagnosis not present

## 2019-10-21 DIAGNOSIS — F0280 Dementia in other diseases classified elsewhere without behavioral disturbance: Secondary | ICD-10-CM | POA: Diagnosis not present

## 2019-10-21 DIAGNOSIS — Z4789 Encounter for other orthopedic aftercare: Secondary | ICD-10-CM | POA: Diagnosis not present

## 2019-10-22 ENCOUNTER — Encounter: Payer: Self-pay | Admitting: Internal Medicine

## 2019-10-22 NOTE — Patient Instructions (Signed)

## 2019-10-24 DIAGNOSIS — J4530 Mild persistent asthma, uncomplicated: Secondary | ICD-10-CM | POA: Diagnosis not present

## 2019-10-24 DIAGNOSIS — E119 Type 2 diabetes mellitus without complications: Secondary | ICD-10-CM | POA: Diagnosis not present

## 2019-10-24 DIAGNOSIS — E785 Hyperlipidemia, unspecified: Secondary | ICD-10-CM | POA: Diagnosis not present

## 2019-10-24 DIAGNOSIS — F411 Generalized anxiety disorder: Secondary | ICD-10-CM | POA: Diagnosis not present

## 2019-10-24 DIAGNOSIS — C50311 Malignant neoplasm of lower-inner quadrant of right female breast: Secondary | ICD-10-CM | POA: Diagnosis not present

## 2019-10-24 DIAGNOSIS — I824Z1 Acute embolism and thrombosis of unspecified deep veins of right distal lower extremity: Secondary | ICD-10-CM | POA: Diagnosis not present

## 2019-10-24 DIAGNOSIS — I10 Essential (primary) hypertension: Secondary | ICD-10-CM | POA: Diagnosis not present

## 2019-10-24 DIAGNOSIS — G300 Alzheimer's disease with early onset: Secondary | ICD-10-CM | POA: Diagnosis not present

## 2019-10-24 DIAGNOSIS — F0280 Dementia in other diseases classified elsewhere without behavioral disturbance: Secondary | ICD-10-CM | POA: Diagnosis not present

## 2019-10-25 ENCOUNTER — Other Ambulatory Visit: Payer: Self-pay

## 2019-10-25 ENCOUNTER — Inpatient Hospital Stay: Payer: Medicare PPO | Attending: Hematology

## 2019-10-25 DIAGNOSIS — Z170 Estrogen receptor positive status [ER+]: Secondary | ICD-10-CM | POA: Insufficient documentation

## 2019-10-25 DIAGNOSIS — Z79899 Other long term (current) drug therapy: Secondary | ICD-10-CM | POA: Diagnosis not present

## 2019-10-25 DIAGNOSIS — Z9221 Personal history of antineoplastic chemotherapy: Secondary | ICD-10-CM | POA: Diagnosis not present

## 2019-10-25 DIAGNOSIS — Z5111 Encounter for antineoplastic chemotherapy: Secondary | ICD-10-CM | POA: Diagnosis not present

## 2019-10-25 DIAGNOSIS — Z7901 Long term (current) use of anticoagulants: Secondary | ICD-10-CM | POA: Diagnosis not present

## 2019-10-25 DIAGNOSIS — Z853 Personal history of malignant neoplasm of breast: Secondary | ICD-10-CM | POA: Insufficient documentation

## 2019-10-25 DIAGNOSIS — Z95828 Presence of other vascular implants and grafts: Secondary | ICD-10-CM

## 2019-10-25 DIAGNOSIS — D61818 Other pancytopenia: Secondary | ICD-10-CM | POA: Diagnosis not present

## 2019-10-25 DIAGNOSIS — C55 Malignant neoplasm of uterus, part unspecified: Secondary | ICD-10-CM | POA: Diagnosis not present

## 2019-10-25 DIAGNOSIS — I2699 Other pulmonary embolism without acute cor pulmonale: Secondary | ICD-10-CM | POA: Diagnosis not present

## 2019-10-25 DIAGNOSIS — N939 Abnormal uterine and vaginal bleeding, unspecified: Secondary | ICD-10-CM | POA: Insufficient documentation

## 2019-10-25 DIAGNOSIS — I700 Atherosclerosis of aorta: Secondary | ICD-10-CM | POA: Insufficient documentation

## 2019-10-25 DIAGNOSIS — Z452 Encounter for adjustment and management of vascular access device: Secondary | ICD-10-CM | POA: Diagnosis not present

## 2019-10-25 DIAGNOSIS — Z923 Personal history of irradiation: Secondary | ICD-10-CM | POA: Insufficient documentation

## 2019-10-25 DIAGNOSIS — R232 Flushing: Secondary | ICD-10-CM | POA: Diagnosis not present

## 2019-10-25 DIAGNOSIS — Z7189 Other specified counseling: Secondary | ICD-10-CM

## 2019-10-25 MED ORDER — HEPARIN SOD (PORK) LOCK FLUSH 100 UNIT/ML IV SOLN
500.0000 [IU] | Freq: Once | INTRAVENOUS | Status: AC
  Administered 2019-10-25: 12:00:00 250 [IU]
  Filled 2019-10-25: qty 5

## 2019-10-25 MED ORDER — SODIUM CHLORIDE 0.9% FLUSH
10.0000 mL | Freq: Once | INTRAVENOUS | Status: AC
  Administered 2019-10-25: 10 mL
  Filled 2019-10-25: qty 10

## 2019-10-26 DIAGNOSIS — I10 Essential (primary) hypertension: Secondary | ICD-10-CM | POA: Diagnosis not present

## 2019-10-26 DIAGNOSIS — J4530 Mild persistent asthma, uncomplicated: Secondary | ICD-10-CM | POA: Diagnosis not present

## 2019-10-26 DIAGNOSIS — F411 Generalized anxiety disorder: Secondary | ICD-10-CM | POA: Diagnosis not present

## 2019-10-26 DIAGNOSIS — E119 Type 2 diabetes mellitus without complications: Secondary | ICD-10-CM | POA: Diagnosis not present

## 2019-10-26 DIAGNOSIS — F0280 Dementia in other diseases classified elsewhere without behavioral disturbance: Secondary | ICD-10-CM | POA: Diagnosis not present

## 2019-10-26 DIAGNOSIS — G300 Alzheimer's disease with early onset: Secondary | ICD-10-CM | POA: Diagnosis not present

## 2019-10-26 DIAGNOSIS — E785 Hyperlipidemia, unspecified: Secondary | ICD-10-CM | POA: Diagnosis not present

## 2019-10-26 DIAGNOSIS — I824Z1 Acute embolism and thrombosis of unspecified deep veins of right distal lower extremity: Secondary | ICD-10-CM | POA: Diagnosis not present

## 2019-10-26 DIAGNOSIS — C50311 Malignant neoplasm of lower-inner quadrant of right female breast: Secondary | ICD-10-CM | POA: Diagnosis not present

## 2019-10-28 DIAGNOSIS — J4530 Mild persistent asthma, uncomplicated: Secondary | ICD-10-CM | POA: Diagnosis not present

## 2019-10-28 DIAGNOSIS — I10 Essential (primary) hypertension: Secondary | ICD-10-CM | POA: Diagnosis not present

## 2019-10-28 DIAGNOSIS — F411 Generalized anxiety disorder: Secondary | ICD-10-CM | POA: Diagnosis not present

## 2019-10-28 DIAGNOSIS — I824Z1 Acute embolism and thrombosis of unspecified deep veins of right distal lower extremity: Secondary | ICD-10-CM | POA: Diagnosis not present

## 2019-10-28 DIAGNOSIS — E119 Type 2 diabetes mellitus without complications: Secondary | ICD-10-CM | POA: Diagnosis not present

## 2019-10-28 DIAGNOSIS — F0280 Dementia in other diseases classified elsewhere without behavioral disturbance: Secondary | ICD-10-CM | POA: Diagnosis not present

## 2019-10-28 DIAGNOSIS — G300 Alzheimer's disease with early onset: Secondary | ICD-10-CM | POA: Diagnosis not present

## 2019-10-28 DIAGNOSIS — C50311 Malignant neoplasm of lower-inner quadrant of right female breast: Secondary | ICD-10-CM | POA: Diagnosis not present

## 2019-10-28 DIAGNOSIS — E785 Hyperlipidemia, unspecified: Secondary | ICD-10-CM | POA: Diagnosis not present

## 2019-10-31 DIAGNOSIS — E119 Type 2 diabetes mellitus without complications: Secondary | ICD-10-CM | POA: Diagnosis not present

## 2019-10-31 DIAGNOSIS — G300 Alzheimer's disease with early onset: Secondary | ICD-10-CM | POA: Diagnosis not present

## 2019-10-31 DIAGNOSIS — J4530 Mild persistent asthma, uncomplicated: Secondary | ICD-10-CM | POA: Diagnosis not present

## 2019-10-31 DIAGNOSIS — C50311 Malignant neoplasm of lower-inner quadrant of right female breast: Secondary | ICD-10-CM | POA: Diagnosis not present

## 2019-10-31 DIAGNOSIS — F411 Generalized anxiety disorder: Secondary | ICD-10-CM | POA: Diagnosis not present

## 2019-10-31 DIAGNOSIS — I10 Essential (primary) hypertension: Secondary | ICD-10-CM | POA: Diagnosis not present

## 2019-10-31 DIAGNOSIS — F0280 Dementia in other diseases classified elsewhere without behavioral disturbance: Secondary | ICD-10-CM | POA: Diagnosis not present

## 2019-10-31 DIAGNOSIS — I824Z1 Acute embolism and thrombosis of unspecified deep veins of right distal lower extremity: Secondary | ICD-10-CM | POA: Diagnosis not present

## 2019-10-31 DIAGNOSIS — E785 Hyperlipidemia, unspecified: Secondary | ICD-10-CM | POA: Diagnosis not present

## 2019-11-01 ENCOUNTER — Other Ambulatory Visit: Payer: Self-pay

## 2019-11-01 ENCOUNTER — Inpatient Hospital Stay: Payer: Medicare PPO

## 2019-11-01 DIAGNOSIS — M810 Age-related osteoporosis without current pathological fracture: Secondary | ICD-10-CM

## 2019-11-01 DIAGNOSIS — I2699 Other pulmonary embolism without acute cor pulmonale: Secondary | ICD-10-CM | POA: Diagnosis not present

## 2019-11-01 DIAGNOSIS — Z452 Encounter for adjustment and management of vascular access device: Secondary | ICD-10-CM | POA: Diagnosis not present

## 2019-11-01 DIAGNOSIS — C55 Malignant neoplasm of uterus, part unspecified: Secondary | ICD-10-CM | POA: Diagnosis not present

## 2019-11-01 DIAGNOSIS — K589 Irritable bowel syndrome without diarrhea: Secondary | ICD-10-CM

## 2019-11-01 DIAGNOSIS — C50311 Malignant neoplasm of lower-inner quadrant of right female breast: Secondary | ICD-10-CM | POA: Diagnosis not present

## 2019-11-01 DIAGNOSIS — F0280 Dementia in other diseases classified elsewhere without behavioral disturbance: Secondary | ICD-10-CM | POA: Diagnosis not present

## 2019-11-01 DIAGNOSIS — Z5111 Encounter for antineoplastic chemotherapy: Secondary | ICD-10-CM | POA: Diagnosis not present

## 2019-11-01 DIAGNOSIS — I10 Essential (primary) hypertension: Secondary | ICD-10-CM | POA: Diagnosis not present

## 2019-11-01 DIAGNOSIS — D61818 Other pancytopenia: Secondary | ICD-10-CM | POA: Diagnosis not present

## 2019-11-01 DIAGNOSIS — E785 Hyperlipidemia, unspecified: Secondary | ICD-10-CM | POA: Diagnosis not present

## 2019-11-01 DIAGNOSIS — G4733 Obstructive sleep apnea (adult) (pediatric): Secondary | ICD-10-CM

## 2019-11-01 DIAGNOSIS — G300 Alzheimer's disease with early onset: Secondary | ICD-10-CM | POA: Diagnosis not present

## 2019-11-01 DIAGNOSIS — E119 Type 2 diabetes mellitus without complications: Secondary | ICD-10-CM | POA: Diagnosis not present

## 2019-11-01 DIAGNOSIS — Z7189 Other specified counseling: Secondary | ICD-10-CM

## 2019-11-01 DIAGNOSIS — R232 Flushing: Secondary | ICD-10-CM | POA: Diagnosis not present

## 2019-11-01 DIAGNOSIS — Z95828 Presence of other vascular implants and grafts: Secondary | ICD-10-CM

## 2019-11-01 DIAGNOSIS — I824Z1 Acute embolism and thrombosis of unspecified deep veins of right distal lower extremity: Secondary | ICD-10-CM | POA: Diagnosis not present

## 2019-11-01 DIAGNOSIS — N939 Abnormal uterine and vaginal bleeding, unspecified: Secondary | ICD-10-CM | POA: Diagnosis not present

## 2019-11-01 DIAGNOSIS — Z170 Estrogen receptor positive status [ER+]: Secondary | ICD-10-CM | POA: Diagnosis not present

## 2019-11-01 DIAGNOSIS — J4530 Mild persistent asthma, uncomplicated: Secondary | ICD-10-CM | POA: Diagnosis not present

## 2019-11-01 DIAGNOSIS — Z7901 Long term (current) use of anticoagulants: Secondary | ICD-10-CM

## 2019-11-01 DIAGNOSIS — I700 Atherosclerosis of aorta: Secondary | ICD-10-CM | POA: Diagnosis not present

## 2019-11-01 DIAGNOSIS — Z86010 Personal history of colonic polyps: Secondary | ICD-10-CM

## 2019-11-01 DIAGNOSIS — Z87891 Personal history of nicotine dependence: Secondary | ICD-10-CM

## 2019-11-01 DIAGNOSIS — F411 Generalized anxiety disorder: Secondary | ICD-10-CM | POA: Diagnosis not present

## 2019-11-01 DIAGNOSIS — Z7984 Long term (current) use of oral hypoglycemic drugs: Secondary | ICD-10-CM

## 2019-11-01 DIAGNOSIS — M1991 Primary osteoarthritis, unspecified site: Secondary | ICD-10-CM

## 2019-11-01 MED ORDER — HEPARIN SOD (PORK) LOCK FLUSH 100 UNIT/ML IV SOLN
250.0000 [IU] | Freq: Once | INTRAVENOUS | Status: AC
  Administered 2019-11-01: 250 [IU]
  Filled 2019-11-01: qty 5

## 2019-11-01 MED ORDER — SODIUM CHLORIDE 0.9% FLUSH
10.0000 mL | Freq: Once | INTRAVENOUS | Status: AC
  Administered 2019-11-01: 10 mL
  Filled 2019-11-01: qty 10

## 2019-11-01 NOTE — Patient Instructions (Signed)
PICC Home Care Guide ° °A peripherally inserted central catheter (PICC) is a form of IV access that allows medicines and IV fluids to be quickly distributed throughout the body. The PICC is a long, thin, flexible tube (catheter) that is inserted into a vein in the upper arm. The catheter ends in a large vein in the chest (superior vena cava, or SVC). After the PICC is inserted, a chest X-ray may be done to make sure that it is in the correct place. °A PICC may be placed for different reasons, such as: °· To give medicines and liquid nutrition. °· To give IV fluids and blood products. °· If there is trouble placing a peripheral intravenous (PIV) catheter. °If taken care of properly, a PICC can remain in place for several months. Having a PICC can also allow a person to go home from the hospital sooner. Medicine and PICC care can be managed at home by a family member, caregiver, or home health care team. °What are the risks? °Generally, having a PICC is safe. However, problems may occur, including: °· A blood clot (thrombus) forming in or at the tip of the PICC. °· A blood clot forming in a vein (deep vein thrombosis) or traveling to the lung (pulmonary embolism). °· Inflammation of the vein (phlebitis) in which the PICC is placed. °· Infection. Central line associated blood stream infection (CLABSI) is a serious infection that often requires hospitalization. °· PICC movement (malposition). The PICC tip may move from its original position due to excessive physical activity, forceful coughing, sneezing, or vomiting. °· A break or cut in the PICC. It is important not to use scissors near the PICC. °· Nerve or tendon irritation or injury during PICC insertion. °How to take care of your PICC °Preventing problems °· You and any caregivers should wash your hands often with soap. Wash hands: °? Before touching the PICC line or the infusion device. °? Before changing a bandage (dressing). °· Flush the PICC as told by your  health care provider. Let your health care provider know right away if the PICC is hard to flush or does not flush. Do not use force to flush the PICC. °· Do not use a syringe that is less than 10 mL to flush the PICC. °· Avoid blood pressure checks on the arm in which the PICC is placed. °· Never pull or tug on the PICC. °· Do not take the PICC out yourself. Only a trained clinical professional should remove the PICC. °· Use clean and sterile supplies only. Keep the supplies in a dry place. Do not reuse needles, syringes, or any other supplies. Doing that can lead to infection. °· Keep pets and children away from your PICC line. °· Check the PICC insertion site every day for signs of infection. Check for: °? Leakage. °? Redness, swelling, or pain. °? Fluid or blood. °? Warmth. °? Pus or a bad smell. °PICC dressing care °· Keep your PICC bandage (dressing) clean and dry to prevent infection. °· Do not take baths, swim, or use a hot tub until your health care provider approves. Ask your health care provider if you can take showers. You may only be allowed to take sponge baths for bathing. When you are allowed to shower: °? Ask your health care provider to teach you how to wrap the PICC line. °? Cover the PICC line with clear plastic wrap and tape to keep it dry while showering. °· Follow instructions from your health care provider   about how to take care of your insertion site and dressing. Make sure you: °? Wash your hands with soap and water before you change your bandage (dressing). If soap and water are not available, use hand sanitizer. °? Change your dressing as told by your health care provider. °? Leave stitches (sutures), skin glue, or adhesive strips in place. These skin closures may need to stay in place for 2 weeks or longer. If adhesive strip edges start to loosen and curl up, you may trim the loose edges. Do not remove adhesive strips completely unless your health care provider tells you to do  that. °· Change your PICC dressing if it becomes loose or wet. °General instructions ° °· Carry your PICC identification card or wear a medical alert bracelet at all times. °· Keep the tube clamped at all times, unless it is being used. °· Carry a smooth-edge clamp with you at all times to place on the tube if it breaks. °· Do not use scissors or sharp objects near the tube. °· You may bend your arm and move it freely. If your PICC is near or at the bend of your elbow, avoid activity with repeated motion at the elbow. °· Avoid lifting heavy objects as told by your health care provider. °· Keep all follow-up visits as told by your health care provider. This is important. °Disposal of supplies °· Throw away any syringes in a disposal container that is meant for sharp items (sharps container). You can buy a sharps container from a pharmacy, or you can make one by using an empty hard plastic bottle with a cover. °· Place any used dressings or infusion bags into a plastic bag. Throw that bag in the trash. °Contact a health care provider if: °· You have pain in your arm, ear, face, or teeth. °· You have a fever or chills. °· You have redness, swelling, or pain around the insertion site. °· You have fluid or blood coming from the insertion site. °· Your insertion site feels warm to the touch. °· You have pus or a bad smell coming from the insertion site. °· Your skin feels hard and raised around the insertion site. °Get help right away if: °· Your PICC is accidentally pulled all the way out. If this happens, cover the insertion site with a bandage or gauze dressing. Do not throw the PICC away. Your health care provider will need to check it. °· Your PICC was tugged or pulled and has partially come out. Do not  push the PICC back in. °· You cannot flush the PICC, it is hard to flush, or the PICC leaks around the insertion site when it is flushed. °· You hear a "flushing" sound when the PICC is flushed. °· You feel your  heart racing or skipping beats. °· There is a hole or tear in the PICC. °· You have swelling in the arm in which the PICC was inserted. °· You have a red streak going up your arm from where the PICC was inserted. °Summary °· A peripherally inserted central catheter (PICC) is a long, thin, flexible tube (catheter) that is inserted into a vein in the upper arm. °· The PICC is inserted using a sterile technique by a specially trained nurse or physician. Only a trained clinical professional should remove it. °· Keep your PICC identification card with you at all times. °· Avoid blood pressure checks on the arm in which the PICC is placed. °· If cared for   properly, a PICC can remain in place for several months. Having a PICC can also allow a person to go home from the hospital sooner. °This information is not intended to replace advice given to you by your health care provider. Make sure you discuss any questions you have with your health care provider. °Document Released: 06/14/2003 Document Revised: 11/20/2017 Document Reviewed: 01/10/2017 °Elsevier Patient Education © 2020 [employer] ° °

## 2019-11-01 NOTE — Progress Notes (Signed)
Cristino Martes D, RN a message stating the patient is only getting flushed with heparin once a week at home and to contact daughter is there is anything that needed to be added as far as appointments for more frequent flush appts.

## 2019-11-08 ENCOUNTER — Inpatient Hospital Stay: Payer: Medicare PPO

## 2019-11-08 ENCOUNTER — Inpatient Hospital Stay (HOSPITAL_BASED_OUTPATIENT_CLINIC_OR_DEPARTMENT_OTHER): Payer: Medicare PPO | Admitting: Hematology and Oncology

## 2019-11-08 ENCOUNTER — Encounter: Payer: Self-pay | Admitting: Hematology and Oncology

## 2019-11-08 ENCOUNTER — Other Ambulatory Visit: Payer: Self-pay

## 2019-11-08 VITALS — BP 143/64 | HR 70 | Temp 98.3°F | Resp 18 | Ht 61.0 in | Wt 222.8 lb

## 2019-11-08 DIAGNOSIS — I2692 Saddle embolus of pulmonary artery without acute cor pulmonale: Secondary | ICD-10-CM

## 2019-11-08 DIAGNOSIS — C55 Malignant neoplasm of uterus, part unspecified: Secondary | ICD-10-CM

## 2019-11-08 DIAGNOSIS — Z7189 Other specified counseling: Secondary | ICD-10-CM

## 2019-11-08 DIAGNOSIS — D61818 Other pancytopenia: Secondary | ICD-10-CM

## 2019-11-08 DIAGNOSIS — Z95828 Presence of other vascular implants and grafts: Secondary | ICD-10-CM

## 2019-11-08 DIAGNOSIS — C50311 Malignant neoplasm of lower-inner quadrant of right female breast: Secondary | ICD-10-CM

## 2019-11-08 DIAGNOSIS — R232 Flushing: Secondary | ICD-10-CM | POA: Diagnosis not present

## 2019-11-08 DIAGNOSIS — Z5111 Encounter for antineoplastic chemotherapy: Secondary | ICD-10-CM | POA: Diagnosis not present

## 2019-11-08 DIAGNOSIS — Z170 Estrogen receptor positive status [ER+]: Secondary | ICD-10-CM

## 2019-11-08 DIAGNOSIS — I700 Atherosclerosis of aorta: Secondary | ICD-10-CM | POA: Diagnosis not present

## 2019-11-08 DIAGNOSIS — I2699 Other pulmonary embolism without acute cor pulmonale: Secondary | ICD-10-CM | POA: Diagnosis not present

## 2019-11-08 DIAGNOSIS — Z452 Encounter for adjustment and management of vascular access device: Secondary | ICD-10-CM | POA: Diagnosis not present

## 2019-11-08 DIAGNOSIS — N939 Abnormal uterine and vaginal bleeding, unspecified: Secondary | ICD-10-CM | POA: Diagnosis not present

## 2019-11-08 LAB — CBC WITH DIFFERENTIAL (CANCER CENTER ONLY)
Abs Immature Granulocytes: 0.01 10*3/uL (ref 0.00–0.07)
Basophils Absolute: 0 10*3/uL (ref 0.0–0.1)
Basophils Relative: 0 %
Eosinophils Absolute: 0 10*3/uL (ref 0.0–0.5)
Eosinophils Relative: 1 %
HCT: 35.3 % — ABNORMAL LOW (ref 36.0–46.0)
Hemoglobin: 11.1 g/dL — ABNORMAL LOW (ref 12.0–15.0)
Immature Granulocytes: 0 %
Lymphocytes Relative: 30 %
Lymphs Abs: 1.1 10*3/uL (ref 0.7–4.0)
MCH: 29.7 pg (ref 26.0–34.0)
MCHC: 31.4 g/dL (ref 30.0–36.0)
MCV: 94.4 fL (ref 80.0–100.0)
Monocytes Absolute: 0.4 10*3/uL (ref 0.1–1.0)
Monocytes Relative: 10 %
Neutro Abs: 2.1 10*3/uL (ref 1.7–7.7)
Neutrophils Relative %: 59 %
Platelet Count: 152 10*3/uL (ref 150–400)
RBC: 3.74 MIL/uL — ABNORMAL LOW (ref 3.87–5.11)
RDW: 18.1 % — ABNORMAL HIGH (ref 11.5–15.5)
WBC Count: 3.7 10*3/uL — ABNORMAL LOW (ref 4.0–10.5)
nRBC: 0 % (ref 0.0–0.2)

## 2019-11-08 LAB — CMP (CANCER CENTER ONLY)
ALT: 16 U/L (ref 0–44)
AST: 14 U/L — ABNORMAL LOW (ref 15–41)
Albumin: 3.5 g/dL (ref 3.5–5.0)
Alkaline Phosphatase: 86 U/L (ref 38–126)
Anion gap: 10 (ref 5–15)
BUN: 20 mg/dL (ref 8–23)
CO2: 27 mmol/L (ref 22–32)
Calcium: 9.6 mg/dL (ref 8.9–10.3)
Chloride: 102 mmol/L (ref 98–111)
Creatinine: 0.76 mg/dL (ref 0.44–1.00)
GFR, Est AFR Am: >60 mL/min (ref >60)
GFR, Estimated: >60 mL/min (ref >60)
Glucose, Bld: 157 mg/dL — ABNORMAL HIGH (ref 70–99)
Potassium: 4.5 mmol/L (ref 3.5–5.1)
Sodium: 139 mmol/L (ref 135–145)
Total Bilirubin: <0.2 mg/dL — ABNORMAL LOW (ref 0.3–1.2)
Total Protein: 7.3 g/dL (ref 6.5–8.1)

## 2019-11-08 MED ORDER — PALONOSETRON HCL INJECTION 0.25 MG/5ML
INTRAVENOUS | Status: AC
  Filled 2019-11-08: qty 5

## 2019-11-08 MED ORDER — SODIUM CHLORIDE 0.9% FLUSH
10.0000 mL | INTRAVENOUS | Status: DC | PRN
  Administered 2019-11-08: 10 mL
  Filled 2019-11-08: qty 10

## 2019-11-08 MED ORDER — PALONOSETRON HCL INJECTION 0.25 MG/5ML
0.2500 mg | Freq: Once | INTRAVENOUS | Status: AC
  Administered 2019-11-08: 0.25 mg via INTRAVENOUS

## 2019-11-08 MED ORDER — SODIUM CHLORIDE 0.9 % IV SOLN
Freq: Once | INTRAVENOUS | Status: AC
  Administered 2019-11-08: 12:00:00 via INTRAVENOUS
  Filled 2019-11-08: qty 5

## 2019-11-08 MED ORDER — SODIUM CHLORIDE 0.9 % IV SOLN
530.0000 mg | Freq: Once | INTRAVENOUS | Status: AC
  Administered 2019-11-08: 530 mg via INTRAVENOUS
  Filled 2019-11-08: qty 53

## 2019-11-08 MED ORDER — HEPARIN SOD (PORK) LOCK FLUSH 100 UNIT/ML IV SOLN
500.0000 [IU] | Freq: Once | INTRAVENOUS | Status: AC | PRN
  Administered 2019-11-08: 14:00:00 500 [IU]
  Filled 2019-11-08: qty 5

## 2019-11-08 MED ORDER — SODIUM CHLORIDE 0.9% FLUSH
10.0000 mL | Freq: Once | INTRAVENOUS | Status: AC
  Administered 2019-11-08: 10 mL
  Filled 2019-11-08: qty 10

## 2019-11-08 MED ORDER — SODIUM CHLORIDE 0.9 % IV SOLN
Freq: Once | INTRAVENOUS | Status: AC
  Administered 2019-11-08: 12:00:00 via INTRAVENOUS
  Filled 2019-11-08: qty 250

## 2019-11-08 NOTE — Assessment & Plan Note (Signed)
She has mild, intermittent vaginal bleeding that could be either due to disease or her Lovenox prescription For now, it is only on an intermittent basis Her anemia is stable We will monitor her diet very carefully

## 2019-11-08 NOTE — Assessment & Plan Note (Signed)
She has stable pancytopenia We will monitor carefully So far, she is not symptomatic She does not need transfusion support

## 2019-11-08 NOTE — Assessment & Plan Note (Signed)
So far, she tolerated treatment fairly well without major side effects I am concerned about her recent vaginal bleeding Could be due to disease progression versus just side effects from Lovenox According to the patient, it was not a consistent basis and is not heavy I warned the patient and family members to monitor closely If she continues along the course of heavy bleeding, I recommend reducing Lovenox to once a day and see if that would help Otherwise, we will schedule CT imaging study to be done next month before I see her back but we might have to move it sooner if she have heavier bleeding The patient and her sister agree with the plan of care

## 2019-11-08 NOTE — Assessment & Plan Note (Signed)
Despite her memory issue, she seems to be coping well I have ongoing discussion with her sister about plan of care She agreed to have CT imaging performed closer to her return appointment but will call me if the patient has more bleeding For now, she will continue weekly PICC line dressing changes and flush here She will continue saline flushes at home

## 2019-11-08 NOTE — Progress Notes (Signed)
[place] OFFICE PROGRESS NOTE  Patient Care Team: [name], NP as PCP - General (Internal Medicine)  ASSESSMENT & PLAN:  Uterine cancer [facility]) So far, she tolerated treatment fairly well without major side effects I am concerned about her recent vaginal bleeding Could be due to disease progression versus just side effects from Lovenox According to the patient, it was not a consistent basis and is not heavy I warned the patient and family members to monitor closely If she continues along the course of heavy bleeding, I recommend reducing Lovenox to once a day and see if that would help Otherwise, we will schedule CT imaging study to be done next month before I see her back but we might have to move it sooner if she have heavier bleeding The patient and her sister agree with the plan of care  Pulmonary embolism ([place]) She has mild, intermittent vaginal bleeding that could be either due to disease or her Lovenox prescription For now, it is only on an intermittent basis Her anemia is stable We will monitor her diet very carefully  Pancytopenia, acquired [facility]) She has stable pancytopenia We will monitor carefully So far, she is not symptomatic She does not need transfusion support  Goals of care, counseling/discussion Despite her memory issue, she seems to be coping well I have ongoing discussion with her sister about plan of care She agreed to have CT imaging performed closer to her return appointment but will call me if the patient has more bleeding For now, she will continue weekly PICC line dressing changes and flush here She will continue saline flushes at home   Orders Placed This Encounter  Procedures  . CT Abdomen Pelvis W Contrast    Standing Status:   Future    Standing Expiration Date:   11/07/2020    Order Specific Question:   If indicated for the ordered procedure, I authorize the administration of contrast media per Radiology protocol   Answer:   Yes    Order Specific Question:   Preferred imaging location?    Answer:   [facility]    Order Specific Question:   Radiology Contrast Protocol - do NOT remove file path    Answer:   \\[email or web address]  . CT Chest W Contrast    Standing Status:   Future    Standing Expiration Date:   11/07/2020    Order Specific Question:   If indicated for the ordered procedure, I authorize the administration of contrast media per Radiology protocol    Answer:   Yes    Order Specific Question:   Preferred imaging location?    Answer:   [facility]    Order Specific Question:   Radiology Contrast Protocol - do NOT remove file path    Answer:   \\[email or web address]    INTERVAL HISTORY: Please see below for problem oriented charting. She returns for further follow-up with her sister The patient does not communicate much but is able to answer simple questions and follow commands According to her sister, she has noticed some intermittent vaginal bleeding that comes and goes but not significant amount Her daughter who is a nurse was able to do saline flushes for the PICC care at home The patient returns her on a weekly basis for PICC line dressing changes and PICC line flushes There were no reported recent nausea or changes in bowel habits She denies cough, chest pain or shortness of breath  SUMMARY OF ONCOLOGIC HISTORY:  Oncology History Overview Note  Uterine cancer  40% ER positive MSI stable    Breast cancer of lower-inner quadrant of right female breast ([place])  05/23/2016 Mammogram   Diagnostic mammogram and ultrasound showed suspicious architectural distortion within the right breast lower inner quadrant, measuring 1.3 cm, without sonographic corelate.   06/03/2016 Initial Biopsy   Right breast inferior lower quadrant core needle biopsy showed atypical ductal hyperplasia with calcifications   06/09/2016 Receptors her2    Breast biopsy showed ER 100% positive, PR 100% positive, HER-2 negative, Ki-67 40%   06/09/2016 Initial Diagnosis   Breast cancer of lower-inner quadrant of right female breast ([place])   06/09/2016 Initial Biopsy   Right breast inner quadrant core needle biopsy showed invasive ductal carcinoma and DCIS, grade 1-2   06/17/2016 Initial Biopsy   Right axillary lymph node core needle biopsy showed metastatic carcinoma   06/17/2016 Receptors her2   Axillary node biopsy showed ER 100% positive, PR 95% positive, HER-2 negative   06/19/2016 Imaging   Bilateral breast MRI showed locations in the lower inner right breast, largest 2.7X1.3X1.6cm, biopsy clips in 2 of this areas. There are abnormal right axillary lymph nodes showing second cortical's, no evidence of malignancy in the left breast.   07/29/2016 Surgery   Right lumpectomy and ALND   07/29/2016 Pathology Results   Right lumpectomy showed G3 IDC, DCIS, margins (-), LVI(-).  1 of 16 nodes was positive    07/29/2016 Miscellaneous   Mammaprint showed high risk disease, luminal type B   09/09/2016 - 11/12/2016 Adjuvant Chemotherapy   Docetaxel and Cytoxan (TC) every 3 weeks   12/24/2016 - 02/03/2017 Radiation Therapy   Adjuvant breast radiation 12/24/16 - 02/03/17 : Right Breast treated to 50.4 Gy in 28 fractions. Right Axilla treated to 45 Gy in 25 fractions.   02/17/2017 -  Anti-estrogen oral therapy   Letrozole 2.5 mg daily    03/23/2017 Imaging   DG Bone Density 03/23/17 ASSESSMENT: The BMD measured at Femur Neck Right is 0.809 g/cm2 with a T-score of -1.6. This patient is considered osteopenic according to [facility]) criteria.   09/08/2017 Mammogram   Diagnostic Mammogram 09/08/17  IMPRESSION: No mammographic evidence of malignancy in either breast, status post right lumpectomy. RECOMMENDATION: Diagnostic mammogram is suggested in 1 year. (Code:[id number])   09/09/2018 Mammogram   09/10/2019 Mammogram IMPRESSION: No  mammographic evidence of malignancy.   Uterine cancer ([place])  08/25/2019 Imaging   US pelvis Heterogeneous mass in the fundus of the uterus as well as a mass in the region of the cervix and vagina. These are felt to represent underlying neoplasm. Direct visualization is recommended as well as possible MRI of the pelvis for further delineation.   09/01/2019 Pathology Results   Endocervical biopsy - HIGH-GRADE ENDOMETRIOID ADENOCARCINOMA. SEE NOTE Immunohistochemical stains show that the tumor cells are diffusely positive for p16, p53 and vimentin. Tumor cells show focal staining for ER and are negative for monoclonal CEA. This immunophenotype is consistent with a gynecologic primary.  ER 40% positive   09/08/2019 Imaging   Ct scan of chest, abdomen and pelvis showed: 1. Acute bilateral pulmonary embolism with large saddle embolus. Dilated main pulmonary artery suggesting pulmonary hypertension. Patient will be brought immediately to the emergency department at [facility] from the lobby of the radiology department per instructions of Dr. Denman George. 2. Infiltrative poorly marginated heterogeneously enhancing 7.0 x 4.3 x 5.0 cm midline pelvic mass centered in the region of the vaginal fornix with  involvement of the uterine cervix and with intimate association with the anterior rectal wall, suspicious for malignancy of uncertain primary site. 3. Extensive lymphadenopathy throughout the mediastinum, retroperitoneum and bilateral pelvis, likely metastatic adenopathy. 4. Enlarged uterus with apparent 7.5 cm anterior uterine body solid mass, nonspecific, potentially large uterine fibroid. No adnexal masses. No ascites. 5. Cholelithiasis. Nonspecific gas in the fundal gallbladder. Consider short-term follow-up CT abdomen to exclude emphysematous cholecystitis. No biliary ductal dilatation. 6. Marked colonic diverticulosis. 7.  Aortic Atherosclerosis (ICD10-I70.0).   09/08/2019 - 09/12/2019 Hospital  Admission   She was admitted to the hospital for management of severe PE.  Her anticoagulation therapy was discontinued and switched to Lovenox   09/14/2019 Cancer Staging   Staging form: Corpus Uteri - Carcinoma and Carcinosarcoma, AJCC 8th Edition - Clinical stage from 09/14/2019: FIGO Stage IVB (cT3, cN2, pM1) - Signed by Heath Lark, MD on 09/14/2019   09/21/2019 Procedure   Successful left arm power PICC line placement with ultrasound and fluoroscopic guidance. The catheter is ready for use.   09/26/2019 -  Chemotherapy   The patient had carboplatin for chemotherapy treatment.       REVIEW OF SYSTEMS:   Constitutional: Denies fevers, chills or abnormal weight loss Eyes: Denies blurriness of vision Ears, nose, mouth, throat, and face: Denies mucositis or sore throat Respiratory: Denies cough, dyspnea or wheezes Cardiovascular: Denies palpitation, chest discomfort or lower extremity swelling Gastrointestinal:  Denies nausea, heartburn or change in bowel habits Skin: Denies abnormal skin rashes Lymphatics: Denies new lymphadenopathy or easy bruising Neurological:Denies numbness, tingling or new weaknesses Behavioral/Psych: Mood is stable, no new changes  All other systems were reviewed with the patient and are negative.  I have reviewed the past medical history, past surgical history, social history and family history with the patient and they are unchanged from previous note.  ALLERGIES:  is allergic to other.  MEDICATIONS:  Current Outpatient Medications  Medication Sig Dispense Refill  . amLODipine (NORVASC) 5 MG tablet Take 1 tablet (5 mg total) by mouth daily. 30 tablet 0  . cetirizine (ZYRTEC) 10 MG tablet Take 10 mg by mouth every evening.     . Cholecalciferol (VITAMIN D-3) 125 MCG (5000 UT) TABS Take 5,000 Units by mouth daily.    . diphenoxylate-atropine (LOMOTIL) 2.5-0.025 MG tablet TAKE ONE TABLET FOUR TIMES DAILY AS NEEDED FOR DIARRHEA OR LOOSE STOOLS 30 tablet 0  .  enoxaparin (LOVENOX) 100 MG/ML injection Inject 1 mL (100 mg total) into the skin every 12 (twelve) hours. 60 mL 0  . exemestane (AROMASIN) 25 MG tablet Take 1 tablet (25 mg total) by mouth daily. 30 tablet 6  . ferrous sulfate 325 (65 FE) MG tablet Take 325 mg by mouth daily with breakfast.    . lisinopril (ZESTRIL) 20 MG tablet Take 1 tablet (20 mg total) by mouth daily. 90 tablet 3  . loperamide (IMODIUM A-D) 2 MG tablet Take 2 mg by mouth 4 (four) times daily as needed.    . memantine (NAMENDA) 10 MG tablet TAKE ONE TABLET BY MOUTH TWICE DAILY (Patient taking differently: Take 10 mg by mouth 2 (two) times daily. ) 60 tablet 5  . nitrofurantoin (MACRODANTIN) 100 MG capsule Take 100 mg by mouth 2 (two) times daily.    . ondansetron (ZOFRAN) 8 MG tablet Take 1 tablet (8 mg total) by mouth every 8 (eight) hours as needed. 30 tablet 1  . Probiotic Product (PROBIOTIC PO) Take 1 capsule by mouth daily.     Marland Kitchen  prochlorperazine (COMPAZINE) 10 MG tablet Take 1 tablet (10 mg total) by mouth every 6 (six) hours as needed (Nausea or vomiting). 30 tablet 1  . rosuvastatin (CRESTOR) 10 MG tablet TAKE ONE TABLET EVERY DAY 30 tablet 2  . sertraline (ZOLOFT) 100 MG tablet Take 1 tablet (100 mg total) by mouth daily. 90 tablet 3  . Sodium Chloride Flush (NORMAL SALINE FLUSH) 0.9 % SOLN Inject 10 ml daily to each lumen, double lumen PICC so please dispense 60 prefilled syringes 600 mL 0   No current facility-administered medications for this visit.    Facility-Administered Medications Ordered in Other Visits  Medication Dose Route Frequency Provider Last Rate Last Dose  . CARBOplatin (PARAPLATIN) 530 mg in sodium chloride 0.9 % 250 mL chemo infusion  530 mg Intravenous Once Heath Lark, MD 303 mL/hr at 11/08/19 1305 530 mg at 11/08/19 1305  . heparin lock flush 100 unit/mL  500 Units Intracatheter Once PRN Alvy Bimler, Telma Pyeatt, MD      . sodium chloride flush (NS) 0.9 % injection 10 mL  10 mL Intracatheter PRN Alvy Bimler,  Ebelin Dillehay, MD        PHYSICAL EXAMINATION: ECOG PERFORMANCE STATUS: 2 - Symptomatic, <50% confined to bed  Vitals:   11/08/19 1112  BP: (!) 143/64  Pulse: 70  Resp: 18  Temp: 98.3 F (36.8 C)  SpO2: 100%   Filed Weights   11/08/19 1112  Weight: 222 lb 12.8 oz (101.1 kg)    GENERAL:alert, no distress and comfortable SKIN: skin color, texture, turgor are normal, no rashes or significant lesions EYES: normal, Conjunctiva are pink and non-injected, sclera clear OROPHARYNX:no exudate, no erythema and lips, buccal mucosa, and tongue normal  NECK: supple, thyroid normal size, non-tender, without nodularity LYMPH:  no palpable lymphadenopathy in the cervical, axillary or inguinal LUNGS: clear to auscultation and percussion with normal breathing effort HEART: regular rate & rhythm and no murmurs and no lower extremity edema ABDOMEN:abdomen soft, non-tender and normal bowel sounds Musculoskeletal:no cyanosis of digits and no clubbing  NEURO: alert & oriented x 3 with fluent speech, no focal motor/sensory deficits  LABORATORY DATA:  I have reviewed the data as listed    Component Value Date/Time   NA 139 11/08/2019 1050   NA 140 09/22/2017 1003   K 4.5 11/08/2019 1050   K 3.8 09/22/2017 1003   CL 102 11/08/2019 1050   CO2 27 11/08/2019 1050   CO2 29 09/22/2017 1003   GLUCOSE 157 (H) 11/08/2019 1050   GLUCOSE 116 09/22/2017 1003   BUN 20 11/08/2019 1050   BUN 22.4 09/22/2017 1003   CREATININE 0.76 11/08/2019 1050   CREATININE 0.71 07/02/2018 1514   CREATININE 0.8 09/22/2017 1003   CALCIUM 9.6 11/08/2019 1050   CALCIUM 10.0 09/22/2017 1003   PROT 7.3 11/08/2019 1050   PROT 7.1 09/22/2017 1003   ALBUMIN 3.5 11/08/2019 1050   ALBUMIN 3.1 (L) 09/22/2017 1003   AST 14 (L) 11/08/2019 1050   AST 14 09/22/2017 1003   ALT 16 11/08/2019 1050   ALT 9 09/22/2017 1003   ALKPHOS 86 11/08/2019 1050   ALKPHOS 126 09/22/2017 1003   BILITOT <0.2 (L) 11/08/2019 1050   BILITOT 0.24 09/22/2017  1003   GFRNONAA >60 11/08/2019 1050   GFRAA >60 11/08/2019 1050    No results found for: SPEP, UPEP  Lab Results  Component Value Date   WBC 3.7 (L) 11/08/2019   NEUTROABS 2.1 11/08/2019   HGB 11.1 (L) 11/08/2019   HCT 35.3 (  L) 11/08/2019   MCV 94.4 11/08/2019   PLT 152 11/08/2019      Chemistry      Component Value Date/Time   NA 139 11/08/2019 1050   NA 140 09/22/2017 1003   K 4.5 11/08/2019 1050   K 3.8 09/22/2017 1003   CL 102 11/08/2019 1050   CO2 27 11/08/2019 1050   CO2 29 09/22/2017 1003   BUN 20 11/08/2019 1050   BUN 22.4 09/22/2017 1003   CREATININE 0.76 11/08/2019 1050   CREATININE 0.71 07/02/2018 1514   CREATININE 0.8 09/22/2017 1003      Component Value Date/Time   CALCIUM 9.6 11/08/2019 1050   CALCIUM 10.0 09/22/2017 1003   ALKPHOS 86 11/08/2019 1050   ALKPHOS 126 09/22/2017 1003   AST 14 (L) 11/08/2019 1050   AST 14 09/22/2017 1003   ALT 16 11/08/2019 1050   ALT 9 09/22/2017 1003   BILITOT <0.2 (L) 11/08/2019 1050   BILITOT 0.24 09/22/2017 1003      All questions were answered. The patient knows to call the clinic with any problems, questions or concerns. No barriers to learning was detected.  I spent 25 minutes counseling the patient face to face. The total time spent in the appointment was 30 minutes and more than 50% was on counseling and review of test results  Heath Lark, MD 11/08/2019 1:50 PM

## 2019-11-08 NOTE — Patient Instructions (Signed)
[facility] Discharge Instructions for Patients Receiving Chemotherapy  Today you received the following chemotherapy agents: Carboplatin.  To help prevent nausea and vomiting after your treatment, we encourage you to take your nausea medication as prescribed.   If you develop nausea and vomiting that is not controlled by your nausea medication, call the clinic.   BELOW ARE SYMPTOMS THAT SHOULD BE REPORTED IMMEDIATELY:  *FEVER GREATER THAN 100.5 F  *CHILLS WITH OR WITHOUT FEVER  NAUSEA AND VOMITING THAT IS NOT CONTROLLED WITH YOUR NAUSEA MEDICATION  *UNUSUAL SHORTNESS OF BREATH  *UNUSUAL BRUISING OR BLEEDING  TENDERNESS IN MOUTH AND THROAT WITH OR WITHOUT PRESENCE OF ULCERS  *URINARY PROBLEMS  *BOWEL PROBLEMS  UNUSUAL RASH Items with * indicate a potential emergency and should be followed up as soon as possible.  Feel free to call the clinic should you have any questions or concerns. The clinic phone number is [phone].  Please show the CHEMO ALERT CARD at check-in to the Emergency Department and triage nurse.     

## 2019-11-10 ENCOUNTER — Other Ambulatory Visit: Payer: Self-pay | Admitting: Hematology

## 2019-11-10 DIAGNOSIS — E119 Type 2 diabetes mellitus without complications: Secondary | ICD-10-CM | POA: Diagnosis not present

## 2019-11-10 DIAGNOSIS — F411 Generalized anxiety disorder: Secondary | ICD-10-CM | POA: Diagnosis not present

## 2019-11-10 DIAGNOSIS — J4530 Mild persistent asthma, uncomplicated: Secondary | ICD-10-CM | POA: Diagnosis not present

## 2019-11-10 DIAGNOSIS — G300 Alzheimer's disease with early onset: Secondary | ICD-10-CM | POA: Diagnosis not present

## 2019-11-10 DIAGNOSIS — I824Z1 Acute embolism and thrombosis of unspecified deep veins of right distal lower extremity: Secondary | ICD-10-CM | POA: Diagnosis not present

## 2019-11-10 DIAGNOSIS — C50311 Malignant neoplasm of lower-inner quadrant of right female breast: Secondary | ICD-10-CM | POA: Diagnosis not present

## 2019-11-10 DIAGNOSIS — I10 Essential (primary) hypertension: Secondary | ICD-10-CM | POA: Diagnosis not present

## 2019-11-10 DIAGNOSIS — E785 Hyperlipidemia, unspecified: Secondary | ICD-10-CM | POA: Diagnosis not present

## 2019-11-10 DIAGNOSIS — F0280 Dementia in other diseases classified elsewhere without behavioral disturbance: Secondary | ICD-10-CM | POA: Diagnosis not present

## 2019-11-10 DIAGNOSIS — Z170 Estrogen receptor positive status [ER+]: Secondary | ICD-10-CM

## 2019-11-11 ENCOUNTER — Other Ambulatory Visit (INDEPENDENT_AMBULATORY_CARE_PROVIDER_SITE_OTHER): Payer: Self-pay | Admitting: Vascular Surgery

## 2019-11-11 DIAGNOSIS — E785 Hyperlipidemia, unspecified: Secondary | ICD-10-CM | POA: Diagnosis not present

## 2019-11-11 DIAGNOSIS — Z95820 Peripheral vascular angioplasty status with implants and grafts: Secondary | ICD-10-CM

## 2019-11-11 DIAGNOSIS — I82421 Acute embolism and thrombosis of right iliac vein: Secondary | ICD-10-CM

## 2019-11-11 DIAGNOSIS — C50311 Malignant neoplasm of lower-inner quadrant of right female breast: Secondary | ICD-10-CM | POA: Diagnosis not present

## 2019-11-11 DIAGNOSIS — I10 Essential (primary) hypertension: Secondary | ICD-10-CM | POA: Diagnosis not present

## 2019-11-11 DIAGNOSIS — F0280 Dementia in other diseases classified elsewhere without behavioral disturbance: Secondary | ICD-10-CM | POA: Diagnosis not present

## 2019-11-11 DIAGNOSIS — E119 Type 2 diabetes mellitus without complications: Secondary | ICD-10-CM | POA: Diagnosis not present

## 2019-11-11 DIAGNOSIS — J4530 Mild persistent asthma, uncomplicated: Secondary | ICD-10-CM | POA: Diagnosis not present

## 2019-11-11 DIAGNOSIS — I824Z1 Acute embolism and thrombosis of unspecified deep veins of right distal lower extremity: Secondary | ICD-10-CM | POA: Diagnosis not present

## 2019-11-11 DIAGNOSIS — F411 Generalized anxiety disorder: Secondary | ICD-10-CM | POA: Diagnosis not present

## 2019-11-11 DIAGNOSIS — G300 Alzheimer's disease with early onset: Secondary | ICD-10-CM | POA: Diagnosis not present

## 2019-11-11 NOTE — Telephone Encounter (Signed)
This came to Dr. Ernestina Penna in box.  I believe she is now Dr. Alvy Bimler.

## 2019-11-14 ENCOUNTER — Telehealth: Payer: Self-pay | Admitting: Oncology

## 2019-11-14 ENCOUNTER — Other Ambulatory Visit: Payer: Self-pay | Admitting: Hematology and Oncology

## 2019-11-14 MED ORDER — ENOXAPARIN SODIUM 100 MG/ML ~~LOC~~ SOLN: 100.0000 mg | Freq: Two times a day (BID) | SUBCUTANEOUS | 11 refills | Status: AC

## 2019-11-14 MED FILL — ENOXAPARIN SODIUM 100 MG/ML: 100 | 30 days supply | Qty: 60 | Fill #0

## 2019-11-14 NOTE — Telephone Encounter (Signed)
Called [name] and discussed that [name] does not have any grant funds at the [employer].  Also discussed that there are not any grants that she can currently apply for per [employer], Estate manager/land agent. [name] verbalized understanding.

## 2019-11-14 NOTE — Telephone Encounter (Signed)
I sent to [facility] with 11 refills now

## 2019-11-14 NOTE — Telephone Encounter (Signed)
Tiffany Arnold called and said Tiffany Arnold is almost out of Lovenox injections. She is wondering if a refill can be sent to [facility].

## 2019-11-14 NOTE — Telephone Encounter (Signed)
Called Tiffany Arnold back and advised her that refill has been sent.  She asked if [name] would qualify for a grant since she is being treated for a different cancer diagnosis.  Inbasket sent to [employer], Estate manager/land agent.

## 2019-11-15 ENCOUNTER — Other Ambulatory Visit: Payer: Self-pay

## 2019-11-15 ENCOUNTER — Inpatient Hospital Stay: Payer: Medicare PPO

## 2019-11-15 ENCOUNTER — Telehealth: Payer: Self-pay | Admitting: Neurology

## 2019-11-15 VITALS — BP 119/77 | HR 66 | Temp 98.2°F | Resp 20

## 2019-11-15 DIAGNOSIS — E785 Hyperlipidemia, unspecified: Secondary | ICD-10-CM | POA: Diagnosis not present

## 2019-11-15 DIAGNOSIS — R232 Flushing: Secondary | ICD-10-CM | POA: Diagnosis not present

## 2019-11-15 DIAGNOSIS — I824Z1 Acute embolism and thrombosis of unspecified deep veins of right distal lower extremity: Secondary | ICD-10-CM | POA: Diagnosis not present

## 2019-11-15 DIAGNOSIS — I10 Essential (primary) hypertension: Secondary | ICD-10-CM | POA: Diagnosis not present

## 2019-11-15 DIAGNOSIS — Z170 Estrogen receptor positive status [ER+]: Secondary | ICD-10-CM | POA: Diagnosis not present

## 2019-11-15 DIAGNOSIS — J4530 Mild persistent asthma, uncomplicated: Secondary | ICD-10-CM | POA: Diagnosis not present

## 2019-11-15 DIAGNOSIS — F0280 Dementia in other diseases classified elsewhere without behavioral disturbance: Secondary | ICD-10-CM | POA: Diagnosis not present

## 2019-11-15 DIAGNOSIS — N939 Abnormal uterine and vaginal bleeding, unspecified: Secondary | ICD-10-CM | POA: Diagnosis not present

## 2019-11-15 DIAGNOSIS — C55 Malignant neoplasm of uterus, part unspecified: Secondary | ICD-10-CM

## 2019-11-15 DIAGNOSIS — Z95828 Presence of other vascular implants and grafts: Secondary | ICD-10-CM

## 2019-11-15 DIAGNOSIS — G300 Alzheimer's disease with early onset: Secondary | ICD-10-CM | POA: Diagnosis not present

## 2019-11-15 DIAGNOSIS — E119 Type 2 diabetes mellitus without complications: Secondary | ICD-10-CM | POA: Diagnosis not present

## 2019-11-15 DIAGNOSIS — F411 Generalized anxiety disorder: Secondary | ICD-10-CM | POA: Diagnosis not present

## 2019-11-15 DIAGNOSIS — D61818 Other pancytopenia: Secondary | ICD-10-CM | POA: Diagnosis not present

## 2019-11-15 DIAGNOSIS — Z452 Encounter for adjustment and management of vascular access device: Secondary | ICD-10-CM | POA: Diagnosis not present

## 2019-11-15 DIAGNOSIS — Z5111 Encounter for antineoplastic chemotherapy: Secondary | ICD-10-CM | POA: Diagnosis not present

## 2019-11-15 DIAGNOSIS — I2699 Other pulmonary embolism without acute cor pulmonale: Secondary | ICD-10-CM | POA: Diagnosis not present

## 2019-11-15 DIAGNOSIS — Z7189 Other specified counseling: Secondary | ICD-10-CM

## 2019-11-15 DIAGNOSIS — I700 Atherosclerosis of aorta: Secondary | ICD-10-CM | POA: Diagnosis not present

## 2019-11-15 DIAGNOSIS — C50311 Malignant neoplasm of lower-inner quadrant of right female breast: Secondary | ICD-10-CM | POA: Diagnosis not present

## 2019-11-15 MED ORDER — HEPARIN SOD (PORK) LOCK FLUSH 100 UNIT/ML IV SOLN
250.0000 [IU] | Freq: Once | INTRAVENOUS | Status: AC
  Administered 2019-11-15: 250 [IU]
  Filled 2019-11-15: qty 5

## 2019-11-15 MED ORDER — SODIUM CHLORIDE 0.9% FLUSH
10.0000 mL | Freq: Once | INTRAVENOUS | Status: AC
  Administered 2019-11-15: 10 mL
  Filled 2019-11-15: qty 10

## 2019-11-15 NOTE — Patient Instructions (Addendum)
PICC Home Care Guide ° °A peripherally inserted central catheter (PICC) is a form of IV access that allows medicines and IV fluids to be quickly distributed throughout the body. The PICC is a long, thin, flexible tube (catheter) that is inserted into a vein in the upper arm. The catheter ends in a large vein in the chest (superior vena cava, or SVC). After the PICC is inserted, a chest X-ray may be done to make sure that it is in the correct place. °A PICC may be placed for different reasons, such as: °· To give medicines and liquid nutrition. °· To give IV fluids and blood products. °· If there is trouble placing a peripheral intravenous (PIV) catheter. °If taken care of properly, a PICC can remain in place for several months. Having a PICC can also allow a person to go home from the hospital sooner. Medicine and PICC care can be managed at home by a family member, caregiver, or home health care team. °What are the risks? °Generally, having a PICC is safe. However, problems may occur, including: °· A blood clot (thrombus) forming in or at the tip of the PICC. °· A blood clot forming in a vein (deep vein thrombosis) or traveling to the lung (pulmonary embolism). °· Inflammation of the vein (phlebitis) in which the PICC is placed. °· Infection. Central line associated blood stream infection (CLABSI) is a serious infection that often requires hospitalization. °· PICC movement (malposition). The PICC tip may move from its original position due to excessive physical activity, forceful coughing, sneezing, or vomiting. °· A break or cut in the PICC. It is important not to use scissors near the PICC. °· Nerve or tendon irritation or injury during PICC insertion. °How to take care of your PICC °Preventing problems °· You and any caregivers should wash your hands often with soap. Wash hands: °? Before touching the PICC line or the infusion device. °? Before changing a bandage (dressing). °· Flush the PICC as told by your  health care provider. Let your health care provider know right away if the PICC is hard to flush or does not flush. Do not use force to flush the PICC. °· Do not use a syringe that is less than 10 mL to flush the PICC. °· Avoid blood pressure checks on the arm in which the PICC is placed. °· Never pull or tug on the PICC. °· Do not take the PICC out yourself. Only a trained clinical professional should remove the PICC. °· Use clean and sterile supplies only. Keep the supplies in a dry place. Do not reuse needles, syringes, or any other supplies. Doing that can lead to infection. °· Keep pets and children away from your PICC line. °· Check the PICC insertion site every day for signs of infection. Check for: °? Leakage. °? Redness, swelling, or pain. °? Fluid or blood. °? Warmth. °? Pus or a bad smell. °PICC dressing care °· Keep your PICC bandage (dressing) clean and dry to prevent infection. °· Do not take baths, swim, or use a hot tub until your health care provider approves. Ask your health care provider if you can take showers. You may only be allowed to take sponge baths for bathing. When you are allowed to shower: °? Ask your health care provider to teach you how to wrap the PICC line. °? Cover the PICC line with clear plastic wrap and tape to keep it dry while showering. °· Follow instructions from your health care provider   about how to take care of your insertion site and dressing. Make sure you: °? Wash your hands with soap and water before you change your bandage (dressing). If soap and water are not available, use hand sanitizer. °? Change your dressing as told by your health care provider. °? Leave stitches (sutures), skin glue, or adhesive strips in place. These skin closures may need to stay in place for 2 weeks or longer. If adhesive strip edges start to loosen and curl up, you may trim the loose edges. Do not remove adhesive strips completely unless your health care provider tells you to do  that. °· Change your PICC dressing if it becomes loose or wet. °General instructions ° °· Carry your PICC identification card or wear a medical alert bracelet at all times. °· Keep the tube clamped at all times, unless it is being used. °· Carry a smooth-edge clamp with you at all times to place on the tube if it breaks. °· Do not use scissors or sharp objects near the tube. °· You may bend your arm and move it freely. If your PICC is near or at the bend of your elbow, avoid activity with repeated motion at the elbow. °· Avoid lifting heavy objects as told by your health care provider. °· Keep all follow-up visits as told by your health care provider. This is important. °Disposal of supplies °· Throw away any syringes in a disposal container that is meant for sharp items (sharps container). You can buy a sharps container from a pharmacy, or you can make one by using an empty hard plastic bottle with a cover. °· Place any used dressings or infusion bags into a plastic bag. Throw that bag in the trash. °Contact a health care provider if: °· You have pain in your arm, ear, face, or teeth. °· You have a fever or chills. °· You have redness, swelling, or pain around the insertion site. °· You have fluid or blood coming from the insertion site. °· Your insertion site feels warm to the touch. °· You have pus or a bad smell coming from the insertion site. °· Your skin feels hard and raised around the insertion site. °Get help right away if: °· Your PICC is accidentally pulled all the way out. If this happens, cover the insertion site with a bandage or gauze dressing. Do not throw the PICC away. Your health care provider will need to check it. °· Your PICC was tugged or pulled and has partially come out. Do not  push the PICC back in. °· You cannot flush the PICC, it is hard to flush, or the PICC leaks around the insertion site when it is flushed. °· You hear a "flushing" sound when the PICC is flushed. °· You feel your  heart racing or skipping beats. °· There is a hole or tear in the PICC. °· You have swelling in the arm in which the PICC was inserted. °· You have a red streak going up your arm from where the PICC was inserted. °Summary °· A peripherally inserted central catheter (PICC) is a long, thin, flexible tube (catheter) that is inserted into a vein in the upper arm. °· The PICC is inserted using a sterile technique by a specially trained nurse or physician. Only a trained clinical professional should remove it. °· Keep your PICC identification card with you at all times. °· Avoid blood pressure checks on the arm in which the PICC is placed. °· If cared for   properly, a PICC can remain in place for several months. Having a PICC can also allow a person to go home from the hospital sooner. °This information is not intended to replace advice given to you by your health care provider. Make sure you discuss any questions you have with your health care provider. °Document Released: 06/14/2003 Document Revised: 11/20/2017 Document Reviewed: 01/10/2017 °Elsevier Patient Education © 2020 [employer] ° °

## 2019-11-15 NOTE — Telephone Encounter (Signed)
I called patient back. Patient did not answer, I LVM requesting she call us back so we can collect her form payment and process her paperwork.

## 2019-11-15 NOTE — Telephone Encounter (Signed)
I called patient to advise that we have received paperwork from [employer] and to collect $50 fee. Patient states that paying this fee is not a problem, but requests that I call back in an hour as patient is just getting out of bed. I advised patient that I would call back shortly and she verbalized understanding.

## 2019-11-15 NOTE — Telephone Encounter (Signed)
Tiffany Arnold called back on behalf of patient and paid $50 form fee for [employer] ppw. 11/15/19

## 2019-11-16 DIAGNOSIS — Z0289 Encounter for other administrative examinations: Secondary | ICD-10-CM

## 2019-11-21 ENCOUNTER — Other Ambulatory Visit: Payer: Self-pay

## 2019-11-21 ENCOUNTER — Ambulatory Visit (INDEPENDENT_AMBULATORY_CARE_PROVIDER_SITE_OTHER): Payer: Medicare PPO

## 2019-11-21 ENCOUNTER — Encounter (INDEPENDENT_AMBULATORY_CARE_PROVIDER_SITE_OTHER): Payer: Self-pay | Admitting: Vascular Surgery

## 2019-11-21 ENCOUNTER — Ambulatory Visit (INDEPENDENT_AMBULATORY_CARE_PROVIDER_SITE_OTHER): Payer: Medicare PPO | Admitting: Vascular Surgery

## 2019-11-21 VITALS — BP 163/67 | HR 86 | Resp 16 | Wt 224.0 lb

## 2019-11-21 DIAGNOSIS — K219 Gastro-esophageal reflux disease without esophagitis: Secondary | ICD-10-CM | POA: Diagnosis not present

## 2019-11-21 DIAGNOSIS — I82401 Acute embolism and thrombosis of unspecified deep veins of right lower extremity: Secondary | ICD-10-CM | POA: Diagnosis not present

## 2019-11-21 DIAGNOSIS — I82421 Acute embolism and thrombosis of right iliac vein: Secondary | ICD-10-CM

## 2019-11-21 DIAGNOSIS — I10 Essential (primary) hypertension: Secondary | ICD-10-CM | POA: Diagnosis not present

## 2019-11-21 DIAGNOSIS — S42202A Unspecified fracture of upper end of left humerus, initial encounter for closed fracture: Secondary | ICD-10-CM | POA: Diagnosis not present

## 2019-11-21 DIAGNOSIS — Z95820 Peripheral vascular angioplasty status with implants and grafts: Secondary | ICD-10-CM | POA: Diagnosis not present

## 2019-11-21 DIAGNOSIS — E119 Type 2 diabetes mellitus without complications: Secondary | ICD-10-CM | POA: Diagnosis not present

## 2019-11-21 DIAGNOSIS — E7800 Pure hypercholesterolemia, unspecified: Secondary | ICD-10-CM | POA: Diagnosis not present

## 2019-11-21 DIAGNOSIS — Z4789 Encounter for other orthopedic aftercare: Secondary | ICD-10-CM | POA: Diagnosis not present

## 2019-11-21 NOTE — Progress Notes (Signed)
MRN : [id number]  Tiffany Arnold is a 78 y.o. (1941-08-03) female who presents with chief complaint of  Chief Complaint  Patient presents with  . Follow-up    ultrasound follow up  .  History of Present Illness:  The patient presents to the office for evaluation of DVT.  DVT was identified at [facility] by Duplex ultrasound.  The initial symptoms were pain and swelling in the lower extremity associated with SOB and some cough.  She is s/p thrombectomy of the right leg with placement of an IVC filter in August, 2020.  The patient notes the leg continues to be somewhat painful with dependency and the right leg does swell quite a bite.  Symptoms are much better with elevation.  The patient notes minimal edema in the morning which steadily worsens throughout the day.    The patient has not been using compression therapy at this point.  No SOB or pleuritic chest pains.  No cough or hemoptysis.  No blood per rectum or blood in any sputum.  No excessive bruising per the patient.    Current Meds  Medication Sig  . cetirizine (ZYRTEC) 10 MG tablet Take 10 mg by mouth every evening.   . Cholecalciferol (VITAMIN D-3) 125 MCG (5000 UT) TABS Take 5,000 Units by mouth daily.  . diphenoxylate-atropine (LOMOTIL) 2.5-0.025 MG tablet TAKE ONE TABLET FOUR TIMES DAILY AS NEEDED FOR DIARRHEA OR LOOSE STOOLS  . enoxaparin (LOVENOX) 100 MG/ML injection Inject 1 mL (100 mg total) into the skin every 12 (twelve) hours.  Marland Kitchen exemestane (AROMASIN) 25 MG tablet TAKE 1 TABLET BY MOUTH DAILY  . ferrous sulfate 325 (65 FE) MG tablet Take 325 mg by mouth daily with breakfast.  . lisinopril (ZESTRIL) 20 MG tablet Take 1 tablet (20 mg total) by mouth daily.  Marland Kitchen loperamide (IMODIUM A-D) 2 MG tablet Take 2 mg by mouth 4 (four) times daily as needed.  . memantine (NAMENDA) 10 MG tablet TAKE ONE TABLET BY MOUTH TWICE DAILY (Patient taking differently: Take 10 mg by mouth 2 (two) times daily. )  . nitrofurantoin  (MACRODANTIN) 100 MG capsule Take 100 mg by mouth 2 (two) times daily.  . ondansetron (ZOFRAN) 8 MG tablet Take 1 tablet (8 mg total) by mouth every 8 (eight) hours as needed.  . Probiotic Product (PROBIOTIC PO) Take 1 capsule by mouth daily.   . prochlorperazine (COMPAZINE) 10 MG tablet Take 1 tablet (10 mg total) by mouth every 6 (six) hours as needed (Nausea or vomiting).  . rosuvastatin (CRESTOR) 10 MG tablet TAKE ONE TABLET EVERY DAY  . sertraline (ZOLOFT) 100 MG tablet Take 1 tablet (100 mg total) by mouth daily.  . Sodium Chloride Flush (NORMAL SALINE FLUSH) 0.9 % SOLN Inject 10 ml daily to each lumen, double lumen PICC so please dispense 60 prefilled syringes    Past Medical History:  Diagnosis Date  . Allergic rhinitis, seasonal   . Arm bruise    left arm down to hand from fall 01-05-2019  . Bilateral renal cysts    simple (monitored by dr Karsten Ro)  . Chronic cystitis    urologist-- dr Karsten Ro  . GAD (generalized anxiety disorder)   . History of cancer chemotherapy 09-09-2016  to 11-12-2016   breast cancer  . History of external beam radiation therapy 12-24-2016  to 02-03-2017   right breast  . History of recurrent UTIs   . Humerus fracture 01/05/2019   left ,  hx total shoulder arthroplasty   .  Hyperlipidemia   . Hypertension   . IBS (irritable bowel syndrome)    mixed  . Lymphedema of right upper extremity   . 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 Memory disorder 06/22/2017   with intermittant confusion;  neurologist-- dr Krista Blue (note in epic)  . Mild persistent asthma    no inhaler  . OA (osteoarthritis)   . OSA on  CPAP   . Type 2 diabetes, diet controlled ([place])   . Urge incontinence of urine   . Wears glasses     Past Surgical History:  Procedure Laterality Date  . BREAST CYST EXCISION Right 2001   negative  . BREAST LUMPECTOMY WITH NEEDLE LOCALIZATION AND AXILLARY LYMPH NODE DISSECTION Right 07/29/2016   Procedure: RIGHT BREAST LUMPECTOMY WITH DOUBLE NEEDLE LOCALIZATION AND COMPLETE RIGHT AXILLARY LYMPH NODE DISSECTION;  Surgeon: Fanny Skates, MD;  Location: [place];  Service: General;  Laterality: Right;  . BREAST SURGERY Left 2000   Biopsy  . BUNIONECTOMY Bilateral 1998   great toe fusion on right foot  . CATARACT EXTRACTION W/ INTRAOCULAR LENS  IMPLANT, BILATERAL  right,  fall 2019;  left 2017  . COLONOSCOPY W/ POLYPECTOMY    . LUMBAR SPINE SURGERY  1970s  . NASAL SINUS SURGERY  2015  . ORIF HUMERUS FRACTURE Left 01/27/2019   Procedure: OPEN REDUCTION INTERNAL FIXATION (ORIF) LEFT HUMERUS;  Surgeon: Justice Britain, MD;  Location: [facility];  Service: Orthopedics;  Laterality: Left;  . PERIPHERAL VASCULAR THROMBECTOMY Right 08/15/2019   Procedure: PERIPHERAL VASCULAR THROMBECTOMY WITH IVC FILTER;  Surgeon: Katha Cabal, MD;  Location: [place] [facility];  Service: Cardiovascular;  Laterality: Right;  . PORTACATH PLACEMENT N/A 09/05/2016   Procedure: INSERTION PORT-A-CATH;  Surgeon: Fanny Skates, MD;  Location: [facility];  Service: General;  Laterality: N/A;  . TOTAL KNEE ARTHROPLASTY Bilateral 2007  . TOTAL SHOULDER REPLACEMENT Left 2011  . UMBILICAL HERNIA REPAIR  04-06-2014   @[facility]     Social History Social History   Tobacco Use  . Smoking status: Former Smoker    Packs/day: 2.00    Years: 25.00    Pack years: 50.00    Quit date: 12/23/1991    Years since quitting: 27.9  . Smokeless tobacco: Never Used  Substance Use Topics  . Alcohol use: Not Currently  . Drug use: No    Family History Family History  Problem Relation Age of Onset  . Arthritis Mother   . Stroke Mother   .  Hypertension Mother   . Cancer Father        Prostate  . Stroke Father   . Hypertension Father   . Hypertension Maternal Grandmother   . Rheum arthritis Maternal Grandfather   . Stroke Maternal Grandfather   . Hypertension Maternal Grandfather   . Cancer Paternal Grandmother        Colon  . Hypertension Paternal Grandmother   . Hypertension Paternal Grandfather   . Breast cancer Neg Hx     Allergies  Allergen Reactions  . Other Shortness Of Breath, Swelling and Other (See Comments)    Cats, dogs, mold Induced asthma Cats, dogs, mold  REVIEW OF SYSTEMS (Negative unless checked)  Constitutional: [] Weight loss  [] Fever  [] Chills Cardiac: [] Chest pain   [] Chest pressure   [] Palpitations   [] Shortness of breath when laying flat   [] Shortness of breath with exertion. Vascular:  [] Pain in legs with walking   [] Pain in legs at rest  [x] History of DVT   [] Phlebitis   [] Swelling in legs   [] Varicose veins   [] Non-healing ulcers Pulmonary:   [] Uses home oxygen   [] Productive cough   [] Hemoptysis   [] Wheeze  [] COPD   [] Asthma Neurologic:  [] Dizziness   [] Seizures   [] History of stroke   [] History of TIA  [] Aphasia   [] Vissual changes   [] Weakness or numbness in arm   [x] Weakness or numbness in leg Musculoskeletal:   [] Joint swelling   [x] Joint pain   [] Low back pain Hematologic:  [] Easy bruising  [] Easy bleeding   [] Hypercoagulable state   [] Anemic Gastrointestinal:  [] Diarrhea   [] Vomiting  [x] Gastroesophageal reflux/heartburn   [] Difficulty swallowing. Genitourinary:  [] Chronic kidney disease   [] Difficult urination  [] Frequent urination   [] Blood in urine Skin:  [] Rashes   [] Ulcers  Psychological:  [] History of anxiety   []  History of major depression.  Physical Examination  Vitals:   11/21/19 1349  BP: (!) 163/67  Pulse: 86  Resp: 16  Weight: 224 lb (101.6 kg)   Body mass index is 42.32 kg/m. Gen: WD/WN, NAD Head: [place]/AT, No temporalis wasting.  Ear/Nose/Throat: Hearing  grossly intact, nares w/o erythema or drainage Eyes: PER, EOMI, sclera nonicteric.  Neck: Supple, no large masses.   Pulmonary:  Good air movement, no audible wheezing bilaterally, no use of accessory muscles.  Cardiac: RRR, no JVD Vascular: scattered varicosities present bilaterally.  Mild venous stasis changes to the legs bilaterally.  3+ soft pitting edema Vessel Right Left  PT Palpable Palpable  DP Palpable Palpable  Gastrointestinal: Non-distended. No guarding/no peritoneal signs.  Musculoskeletal: M/S 5/5 throughout.  No deformity or atrophy.  Neurologic: CN 2-12 intact. Symmetrical.  Speech is fluent. Motor exam as listed above. Psychiatric: Judgment intact, Mood & affect appropriate for pt's clinical situation. Dermatologic: Venous rashes no ulcers noted.  No changes consistent with cellulitis. Lymph : No lichenification or skin changes of chronic lymphedema.  CBC Lab Results  Component Value Date   WBC 3.7 (L) 11/08/2019   HGB 11.1 (L) 11/08/2019   HCT 35.3 (L) 11/08/2019   MCV 94.4 11/08/2019   PLT 152 11/08/2019    BMET    Component Value Date/Time   NA 139 11/08/2019 1050   NA 140 09/22/2017 1003   K 4.5 11/08/2019 1050   K 3.8 09/22/2017 1003   CL 102 11/08/2019 1050   CO2 27 11/08/2019 1050   CO2 29 09/22/2017 1003   GLUCOSE 157 (H) 11/08/2019 1050   GLUCOSE 116 09/22/2017 1003   BUN 20 11/08/2019 1050   BUN 22.4 09/22/2017 1003   CREATININE 0.76 11/08/2019 1050   CREATININE 0.71 07/02/2018 1514   CREATININE 0.8 09/22/2017 1003   CALCIUM 9.6 11/08/2019 1050   CALCIUM 10.0 09/22/2017 1003   GFRNONAA >60 11/08/2019 1050   GFRAA >60 11/08/2019 1050   Estimated Creatinine Clearance: 66.5 mL/min (by C-G formula based on SCr of 0.76 mg/dL).  COAG Lab Results  Component Value Date   INR 1.4 (H) 09/08/2019   INR 1.0 08/15/2019   INR 1.02 07/08/2017    Radiology No results found.   Assessment/Plan 1. Acute deep vein thrombosis (DVT) of right lower  extremity, unspecified vein (HCC) Recommend:   No surgery or intervention at this point in time.  IVC filter is present and the patient does not want it removed.  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  The patient is initiated on anticoagulation   Elevation was stressed, use of a recliner was discussed.  I have had a long discussion with the patient regarding DVT and post phlebitic changes such as swelling and why it  causes symptoms such as pain.  The patient will wear graduated compression stockings class 1 (20-30 mmHg), beginning after three full days of anticoagulation, on a daily basis a prescription was given. The patient will  beginning wearing the stockings first thing in the morning and removing them in the evening. The patient is instructed specifically not to sleep in the stockings.  In addition, behavioral modification including elevation during the day and avoidance of prolonged dependency will be initiated.    The patient will continue anticoagulation for now as there have not been any problems or complications at this point.   - IVC/ILIACS DUPLEX; Future  2. Essential hypertension Continue antihypertensive medications as already ordered, these medications have been reviewed and there are no changes at this time.   3. Gastroesophageal reflux disease without esophagitis Continue PPI as already ordered, this medication has been reviewed and there are no changes at this time.  Avoidence of caffeine and alcohol  Moderate elevation of the head of the bed   4. Type 2 diabetes mellitus without complication, without long-term current use of insulin (HCC) Continue hypoglycemic medications as already ordered, these medications have been reviewed and there are no changes at this time.  Hgb A1C to be monitored as already arranged by primary service   5. Pure hypercholesterolemia  Continue statin as ordered and reviewed, no changes at this time   [name], MD  11/21/2019 2:02 PM

## 2019-11-22 ENCOUNTER — Inpatient Hospital Stay: Payer: Medicare PPO | Attending: Hematology

## 2019-11-22 ENCOUNTER — Other Ambulatory Visit: Payer: Self-pay

## 2019-11-22 DIAGNOSIS — M5136 Other intervertebral disc degeneration, lumbar region: Secondary | ICD-10-CM | POA: Insufficient documentation

## 2019-11-22 DIAGNOSIS — Z86711 Personal history of pulmonary embolism: Secondary | ICD-10-CM | POA: Diagnosis not present

## 2019-11-22 DIAGNOSIS — K5730 Diverticulosis of large intestine without perforation or abscess without bleeding: Secondary | ICD-10-CM | POA: Diagnosis not present

## 2019-11-22 DIAGNOSIS — D259 Leiomyoma of uterus, unspecified: Secondary | ICD-10-CM | POA: Diagnosis not present

## 2019-11-22 DIAGNOSIS — K8020 Calculus of gallbladder without cholecystitis without obstruction: Secondary | ICD-10-CM | POA: Diagnosis not present

## 2019-11-22 DIAGNOSIS — E538 Deficiency of other specified B group vitamins: Secondary | ICD-10-CM | POA: Insufficient documentation

## 2019-11-22 DIAGNOSIS — M8580 Other specified disorders of bone density and structure, unspecified site: Secondary | ICD-10-CM | POA: Insufficient documentation

## 2019-11-22 DIAGNOSIS — I700 Atherosclerosis of aorta: Secondary | ICD-10-CM | POA: Insufficient documentation

## 2019-11-22 DIAGNOSIS — I2699 Other pulmonary embolism without acute cor pulmonale: Secondary | ICD-10-CM | POA: Diagnosis not present

## 2019-11-22 DIAGNOSIS — Z170 Estrogen receptor positive status [ER+]: Secondary | ICD-10-CM | POA: Diagnosis not present

## 2019-11-22 DIAGNOSIS — Z7901 Long term (current) use of anticoagulants: Secondary | ICD-10-CM | POA: Insufficient documentation

## 2019-11-22 DIAGNOSIS — Z853 Personal history of malignant neoplasm of breast: Secondary | ICD-10-CM | POA: Diagnosis not present

## 2019-11-22 DIAGNOSIS — R234 Changes in skin texture: Secondary | ICD-10-CM | POA: Insufficient documentation

## 2019-11-22 DIAGNOSIS — Z5111 Encounter for antineoplastic chemotherapy: Secondary | ICD-10-CM | POA: Insufficient documentation

## 2019-11-22 DIAGNOSIS — R590 Localized enlarged lymph nodes: Secondary | ICD-10-CM | POA: Insufficient documentation

## 2019-11-22 DIAGNOSIS — I82401 Acute embolism and thrombosis of unspecified deep veins of right lower extremity: Secondary | ICD-10-CM | POA: Diagnosis not present

## 2019-11-22 DIAGNOSIS — D61818 Other pancytopenia: Secondary | ICD-10-CM | POA: Insufficient documentation

## 2019-11-22 DIAGNOSIS — C55 Malignant neoplasm of uterus, part unspecified: Secondary | ICD-10-CM | POA: Diagnosis not present

## 2019-11-22 DIAGNOSIS — Z79899 Other long term (current) drug therapy: Secondary | ICD-10-CM | POA: Insufficient documentation

## 2019-11-22 DIAGNOSIS — Z86718 Personal history of other venous thrombosis and embolism: Secondary | ICD-10-CM | POA: Diagnosis not present

## 2019-11-22 DIAGNOSIS — Z452 Encounter for adjustment and management of vascular access device: Secondary | ICD-10-CM | POA: Insufficient documentation

## 2019-11-22 DIAGNOSIS — R0602 Shortness of breath: Secondary | ICD-10-CM | POA: Diagnosis not present

## 2019-11-22 DIAGNOSIS — Z923 Personal history of irradiation: Secondary | ICD-10-CM | POA: Insufficient documentation

## 2019-11-22 DIAGNOSIS — Z95828 Presence of other vascular implants and grafts: Secondary | ICD-10-CM

## 2019-11-22 DIAGNOSIS — Z7189 Other specified counseling: Secondary | ICD-10-CM

## 2019-11-22 MED ORDER — SODIUM CHLORIDE 0.9% FLUSH
10.0000 mL | Freq: Once | INTRAVENOUS | Status: DC
  Filled 2019-11-22: qty 10

## 2019-11-22 MED ORDER — SODIUM CHLORIDE 0.9% FLUSH
10.0000 mL | Freq: Once | INTRAVENOUS | Status: AC
  Administered 2019-11-22: 12:00:00 10 mL
  Filled 2019-11-22: qty 10

## 2019-11-22 MED ORDER — HEPARIN SOD (PORK) LOCK FLUSH 100 UNIT/ML IV SOLN
250.0000 [IU] | Freq: Once | INTRAVENOUS | Status: AC
  Administered 2019-11-22: 250 [IU]
  Filled 2019-11-22: qty 5

## 2019-11-28 ENCOUNTER — Ambulatory Visit
Admission: RE | Admit: 2019-11-28 | Discharge: 2019-11-28 | Disposition: A | Discharge status: Home / Self Care | Payer: Medicare PPO | Source: Ambulatory Visit | Attending: Hematology and Oncology | Admitting: Hematology and Oncology

## 2019-11-28 ENCOUNTER — Other Ambulatory Visit: Payer: Self-pay

## 2019-11-28 DIAGNOSIS — I2692 Saddle embolus of pulmonary artery without acute cor pulmonale: Secondary | ICD-10-CM | POA: Diagnosis not present

## 2019-11-28 DIAGNOSIS — C55 Malignant neoplasm of uterus, part unspecified: Secondary | ICD-10-CM | POA: Insufficient documentation

## 2019-11-28 DIAGNOSIS — C541 Malignant neoplasm of endometrium: Secondary | ICD-10-CM | POA: Diagnosis not present

## 2019-11-28 DIAGNOSIS — I2699 Other pulmonary embolism without acute cor pulmonale: Secondary | ICD-10-CM | POA: Diagnosis not present

## 2019-11-28 MED ORDER — IOHEXOL 300 MG/ML  SOLN
100.0000 mL | Freq: Once | INTRAMUSCULAR | Status: AC | PRN
  Administered 2019-11-28: 100 mL via INTRAVENOUS

## 2019-11-28 NOTE — Telephone Encounter (Signed)
Spoke to sister of pt.  I completed p/w, sent to SS/ NP for review as last saw pt.  May need to be filled out by Dr. [name]. (signed).  I see from media, pcp has filled out this as well.

## 2019-11-29 ENCOUNTER — Inpatient Hospital Stay: Payer: Medicare PPO

## 2019-11-29 ENCOUNTER — Inpatient Hospital Stay (HOSPITAL_BASED_OUTPATIENT_CLINIC_OR_DEPARTMENT_OTHER): Payer: Medicare PPO | Admitting: Hematology and Oncology

## 2019-11-29 ENCOUNTER — Encounter: Payer: Self-pay | Admitting: *Deleted

## 2019-11-29 ENCOUNTER — Other Ambulatory Visit: Payer: Self-pay

## 2019-11-29 VITALS — BP 117/67 | HR 86 | Temp 98.3°F | Resp 18

## 2019-11-29 DIAGNOSIS — Z95828 Presence of other vascular implants and grafts: Secondary | ICD-10-CM

## 2019-11-29 DIAGNOSIS — M8580 Other specified disorders of bone density and structure, unspecified site: Secondary | ICD-10-CM | POA: Diagnosis not present

## 2019-11-29 DIAGNOSIS — C55 Malignant neoplasm of uterus, part unspecified: Secondary | ICD-10-CM | POA: Diagnosis not present

## 2019-11-29 DIAGNOSIS — Z7189 Other specified counseling: Secondary | ICD-10-CM

## 2019-11-29 DIAGNOSIS — Z452 Encounter for adjustment and management of vascular access device: Secondary | ICD-10-CM | POA: Diagnosis not present

## 2019-11-29 DIAGNOSIS — D61818 Other pancytopenia: Secondary | ICD-10-CM | POA: Diagnosis not present

## 2019-11-29 DIAGNOSIS — Z170 Estrogen receptor positive status [ER+]: Secondary | ICD-10-CM

## 2019-11-29 DIAGNOSIS — Z5111 Encounter for antineoplastic chemotherapy: Secondary | ICD-10-CM | POA: Diagnosis not present

## 2019-11-29 DIAGNOSIS — R0602 Shortness of breath: Secondary | ICD-10-CM | POA: Diagnosis not present

## 2019-11-29 DIAGNOSIS — I2699 Other pulmonary embolism without acute cor pulmonale: Secondary | ICD-10-CM | POA: Diagnosis not present

## 2019-11-29 DIAGNOSIS — R590 Localized enlarged lymph nodes: Secondary | ICD-10-CM | POA: Diagnosis not present

## 2019-11-29 DIAGNOSIS — I700 Atherosclerosis of aorta: Secondary | ICD-10-CM | POA: Diagnosis not present

## 2019-11-29 DIAGNOSIS — C50311 Malignant neoplasm of lower-inner quadrant of right female breast: Secondary | ICD-10-CM

## 2019-11-29 DIAGNOSIS — I2692 Saddle embolus of pulmonary artery without acute cor pulmonale: Secondary | ICD-10-CM | POA: Diagnosis not present

## 2019-11-29 LAB — CBC WITH DIFFERENTIAL (CANCER CENTER ONLY)
Abs Immature Granulocytes: 0.01 10*3/uL (ref 0.00–0.07)
Basophils Absolute: 0 10*3/uL (ref 0.0–0.1)
Basophils Relative: 1 %
Eosinophils Absolute: 0 10*3/uL (ref 0.0–0.5)
Eosinophils Relative: 0 %
HCT: 33 % — ABNORMAL LOW (ref 36.0–46.0)
Hemoglobin: 10.6 g/dL — ABNORMAL LOW (ref 12.0–15.0)
Immature Granulocytes: 0 %
Lymphocytes Relative: 33 %
Lymphs Abs: 1.2 10*3/uL (ref 0.7–4.0)
MCH: 30.9 pg (ref 26.0–34.0)
MCHC: 32.1 g/dL (ref 30.0–36.0)
MCV: 96.2 fL (ref 80.0–100.0)
Monocytes Absolute: 0.4 10*3/uL (ref 0.1–1.0)
Monocytes Relative: 10 %
Neutro Abs: 2.1 10*3/uL (ref 1.7–7.7)
Neutrophils Relative %: 56 %
Platelet Count: 92 10*3/uL — ABNORMAL LOW (ref 150–400)
RBC: 3.43 MIL/uL — ABNORMAL LOW (ref 3.87–5.11)
RDW: 18.7 % — ABNORMAL HIGH (ref 11.5–15.5)
WBC Count: 3.8 10*3/uL — ABNORMAL LOW (ref 4.0–10.5)
nRBC: 0 % (ref 0.0–0.2)

## 2019-11-29 LAB — CMP (CANCER CENTER ONLY)
ALT: 13 U/L (ref 0–44)
AST: 13 U/L — ABNORMAL LOW (ref 15–41)
Albumin: 3.5 g/dL (ref 3.5–5.0)
Alkaline Phosphatase: 88 U/L (ref 38–126)
Anion gap: 7 (ref 5–15)
BUN: 18 mg/dL (ref 8–23)
CO2: 28 mmol/L (ref 22–32)
Calcium: 9.4 mg/dL (ref 8.9–10.3)
Chloride: 106 mmol/L (ref 98–111)
Creatinine: 0.73 mg/dL (ref 0.44–1.00)
GFR, Est AFR Am: >60 mL/min (ref >60)
GFR, Estimated: >60 mL/min (ref >60)
Glucose, Bld: 132 mg/dL — ABNORMAL HIGH (ref 70–99)
Potassium: 4.3 mmol/L (ref 3.5–5.1)
Sodium: 141 mmol/L (ref 135–145)
Total Bilirubin: <0.2 mg/dL — ABNORMAL LOW (ref 0.3–1.2)
Total Protein: 7.3 g/dL (ref 6.5–8.1)

## 2019-11-29 MED ORDER — SODIUM CHLORIDE 0.9 % IV SOLN
Freq: Once | INTRAVENOUS | Status: AC
  Administered 2019-11-29: 13:00:00 via INTRAVENOUS
  Filled 2019-11-29: qty 5

## 2019-11-29 MED ORDER — HEPARIN SOD (PORK) LOCK FLUSH 100 UNIT/ML IV SOLN
250.0000 [IU] | Freq: Once | INTRAVENOUS | Status: AC | PRN
  Administered 2019-11-29: 250 [IU]
  Filled 2019-11-29: qty 5

## 2019-11-29 MED ORDER — SODIUM CHLORIDE 0.9% FLUSH
10.0000 mL | INTRAVENOUS | Status: DC | PRN
  Filled 2019-11-29: qty 10

## 2019-11-29 MED ORDER — PALONOSETRON HCL INJECTION 0.25 MG/5ML
INTRAVENOUS | Status: AC
  Filled 2019-11-29: qty 5

## 2019-11-29 MED ORDER — SODIUM CHLORIDE 0.9 % IV SOLN
Freq: Once | INTRAVENOUS | Status: AC
  Administered 2019-11-29: 12:00:00 via INTRAVENOUS
  Filled 2019-11-29: qty 250

## 2019-11-29 MED ORDER — SODIUM CHLORIDE 0.9 % IV SOLN: 500.0000 mg | Freq: Once | INTRAVENOUS | Status: DC

## 2019-11-29 MED ORDER — PALONOSETRON HCL INJECTION 0.25 MG/5ML
0.2500 mg | Freq: Once | INTRAVENOUS | Status: AC
  Administered 2019-11-29: 0.25 mg via INTRAVENOUS

## 2019-11-29 MED ORDER — SODIUM CHLORIDE 0.9% FLUSH
10.0000 mL | Freq: Once | INTRAVENOUS | Status: AC
  Administered 2019-11-29: 11:00:00 10 mL
  Filled 2019-11-29: qty 10

## 2019-11-29 MED ORDER — SODIUM CHLORIDE 0.9 % IV SOLN
400.0000 mg | Freq: Once | INTRAVENOUS | Status: AC
  Administered 2019-11-29: 400 mg via INTRAVENOUS
  Filled 2019-11-29: qty 40

## 2019-11-29 MED ORDER — HEPARIN SOD (PORK) LOCK FLUSH 100 UNIT/ML IV SOLN
500.0000 [IU] | Freq: Once | INTRAVENOUS | Status: DC | PRN
  Filled 2019-11-29: qty 5

## 2019-11-29 NOTE — Patient Instructions (Signed)
[facility] Discharge Instructions for Patients Receiving Chemotherapy  Today you received the following chemotherapy agents Carboplatin  To help prevent nausea and vomiting after your treatment, we encourage you to take your nausea medication as directed   If you develop nausea and vomiting that is not controlled by your nausea medication, call the clinic.   BELOW ARE SYMPTOMS THAT SHOULD BE REPORTED IMMEDIATELY:  *FEVER GREATER THAN 100.5 F  *CHILLS WITH OR WITHOUT FEVER  NAUSEA AND VOMITING THAT IS NOT CONTROLLED WITH YOUR NAUSEA MEDICATION  *UNUSUAL SHORTNESS OF BREATH  *UNUSUAL BRUISING OR BLEEDING  TENDERNESS IN MOUTH AND THROAT WITH OR WITHOUT PRESENCE OF ULCERS  *URINARY PROBLEMS  *BOWEL PROBLEMS  UNUSUAL RASH Items with * indicate a potential emergency and should be followed up as soon as possible.  Feel free to call the clinic should you have any questions or concerns. The clinic phone number is [phone].  Please show the CHEMO ALERT CARD at check-in to the Emergency Department and triage nurse.   

## 2019-11-29 NOTE — Progress Notes (Signed)
11/29/19  Confirmed dose of carboplatin to be AUC 4  CrCl = 84ml/min  Dose calculated = 400 mg  T.O. Dr Lesli Albee, PharmD

## 2019-11-30 ENCOUNTER — Encounter: Payer: Self-pay | Admitting: Hematology and Oncology

## 2019-11-30 NOTE — Progress Notes (Signed)
[place] OFFICE PROGRESS NOTE  Patient Care Team: [name], NP as PCP - General (Internal Medicine)  ASSESSMENT & PLAN:  Uterine cancer [facility]) I have reviewed multiple imaging studies with her sister The patient is unable to give informed decision about treatment options According to the radiologist, there is new lymphadenopathy on the left supraclavicular region and left axilla However, those areas were obscured on her previous imaging All other measurable disease not improving Overall, she tolerated chemotherapy very well I recommend we proceed with 3 more months of single agent carboplatinum and repeat imaging study Her sister agreed  Pulmonary embolism ([employer]) CT imaging show partial resolution of PE She will continue Lovenox She has scant intermittent vaginal bleeding but not severe We will observe closely  Pancytopenia, acquired [facility]) She has progressive pancytopenia I plan to reduce the dose of carboplatin  Goals of care, counseling/discussion I reviewed the plan of care and discussed goals of care with her sister Overall, we agreed to proceed with further palliative chemotherapy   No orders of the defined types were placed in this encounter.   INTERVAL HISTORY: Please see below for problem oriented charting. She returns with her sister for review of CT imaging and further chemotherapy She has no recent falls Denies recent nausea or changes in bowel habits She have scant intermittent vaginal bleeding but not severe Her shortness of breath is stable  SUMMARY OF ONCOLOGIC HISTORY: Oncology History Overview Note  Uterine cancer  40% ER positive MSI stable    Breast cancer of lower-inner quadrant of right female breast ([place])  05/23/2016 Mammogram   Diagnostic mammogram and ultrasound showed suspicious architectural distortion within the right breast lower inner quadrant, measuring 1.3 cm, without sonographic corelate.   06/03/2016 Initial  Biopsy   Right breast inferior lower quadrant core needle biopsy showed atypical ductal hyperplasia with calcifications   06/09/2016 Receptors her2   Breast biopsy showed ER 100% positive, PR 100% positive, HER-2 negative, Ki-67 40%   06/09/2016 Initial Diagnosis   Breast cancer of lower-inner quadrant of right female breast ([place])   06/09/2016 Initial Biopsy   Right breast inner quadrant core needle biopsy showed invasive ductal carcinoma and DCIS, grade 1-2   06/17/2016 Initial Biopsy   Right axillary lymph node core needle biopsy showed metastatic carcinoma   06/17/2016 Receptors her2   Axillary node biopsy showed ER 100% positive, PR 95% positive, HER-2 negative   06/19/2016 Imaging   Bilateral breast MRI showed locations in the lower inner right breast, largest 2.7X1.3X1.6cm, biopsy clips in 2 of this areas. There are abnormal right axillary lymph nodes showing second cortical's, no evidence of malignancy in the left breast.   07/29/2016 Surgery   Right lumpectomy and ALND   07/29/2016 Pathology Results   Right lumpectomy showed G3 IDC, DCIS, margins (-), LVI(-).  1 of 16 nodes was positive    07/29/2016 Miscellaneous   Mammaprint showed high risk disease, luminal type B   09/09/2016 - 11/12/2016 Adjuvant Chemotherapy   Docetaxel and Cytoxan (TC) every 3 weeks   12/24/2016 - 02/03/2017 Radiation Therapy   Adjuvant breast radiation 12/24/16 - 02/03/17 : Right Breast treated to 50.4 Gy in 28 fractions. Right Axilla treated to 45 Gy in 25 fractions.   02/17/2017 -  Anti-estrogen oral therapy   Letrozole 2.5 mg daily    03/23/2017 Imaging   DG Bone Density 03/23/17 ASSESSMENT: The BMD measured at Femur Neck Right is 0.809 g/cm2 with a T-score of -1.6. This  patient is considered osteopenic according to [facility]) criteria.   09/08/2017 Mammogram   Diagnostic Mammogram 09/08/17  IMPRESSION: No mammographic evidence of malignancy in either breast, status post right  lumpectomy. RECOMMENDATION: Diagnostic mammogram is suggested in 1 year. (Code:[id number])   09/09/2018 Mammogram   09/10/2019 Mammogram IMPRESSION: No mammographic evidence of malignancy.   Uterine cancer ([place])  08/25/2019 Imaging   US pelvis Heterogeneous mass in the fundus of the uterus as well as a mass in the region of the cervix and vagina. These are felt to represent underlying neoplasm. Direct visualization is recommended as well as possible MRI of the pelvis for further delineation.   09/01/2019 Pathology Results   Endocervical biopsy - HIGH-GRADE ENDOMETRIOID ADENOCARCINOMA. SEE NOTE Immunohistochemical stains show that the tumor cells are diffusely positive for p16, p53 and vimentin. Tumor cells show focal staining for ER and are negative for monoclonal CEA. This immunophenotype is consistent with a gynecologic primary.  ER 40% positive   09/08/2019 Imaging   Ct scan of chest, abdomen and pelvis showed: 1. Acute bilateral pulmonary embolism with large saddle embolus. Dilated main pulmonary artery suggesting pulmonary hypertension. Patient will be brought immediately to the emergency department at [facility] from the lobby of the radiology department per instructions of Dr. Denman George. 2. Infiltrative poorly marginated heterogeneously enhancing 7.0 x 4.3 x 5.0 cm midline pelvic mass centered in the region of the vaginal fornix with involvement of the uterine cervix and with intimate association with the anterior rectal wall, suspicious for malignancy of uncertain primary site. 3. Extensive lymphadenopathy throughout the mediastinum, retroperitoneum and bilateral pelvis, likely metastatic adenopathy. 4. Enlarged uterus with apparent 7.5 cm anterior uterine body solid mass, nonspecific, potentially large uterine fibroid. No adnexal masses. No ascites. 5. Cholelithiasis. Nonspecific gas in the fundal gallbladder. Consider short-term follow-up CT abdomen to exclude emphysematous  cholecystitis. No biliary ductal dilatation. 6. Marked colonic diverticulosis. 7.  Aortic Atherosclerosis (ICD10-I70.0).   09/08/2019 - 09/12/2019 Hospital Admission   She was admitted to the hospital for management of severe PE.  Her anticoagulation therapy was discontinued and switched to Lovenox   09/14/2019 Cancer Staging   Staging form: Corpus Uteri - Carcinoma and Carcinosarcoma, AJCC 8th Edition - Clinical stage from 09/14/2019: FIGO Stage IVB (cT3, cN2, pM1) - Signed by Heath Lark, MD on 09/14/2019   09/21/2019 Procedure   Successful left arm power PICC line placement with ultrasound and fluoroscopic guidance. The catheter is ready for use.   09/26/2019 -  Chemotherapy   The patient had carboplatin for chemotherapy treatment.     11/28/2019 Imaging   Ct chest, abdomen and pelvis 1. Interval decrease in size of a mass centered about the cervix, vaginal fornix, and closely abutting the superior rectum, consistent with treatment response. 2. Interval decrease in size of numerous enlarged mediastinal, retroperitoneal, iliac, and pelvic sidewall lymph nodes, findings consistent with treatment response of metastatic disease. 3. There is an enlarged left axillary lymph node measuring 2.6 x 2.0 cm (series 2, image 3), which appears to be new when compared to prior examination although imaging of the left axilla at that time was limited by patient arm in the down position and adjacent arthroplasty. There are newly enlarged left superior mediastinal/supraclavicular lymph nodes measuring up to 1.6 x 1.1 cm (series 2, image 4). Findings are concerning for mixed treatment response. Attention on follow-up. If possible, recommend performing all future examinations with patient arm in the elevated position to ensure adequate evaluation of the  axilla. 4. There has been partial interval resolution of previously seen saddle pulmonary embolus, now with generally more eccentric and bandlike subacute to chronic  embolus present in the left and right pulmonary arteries as well as the lower lobe branch vessels (series 3, image 135, 170, 205). No new embolus noted. 5. Unchanged postoperative and post treatment appearance of the right breast. 6. Stable appearance of air within the gallbladder fundus or gallbladder wall, closely adjacent to a loop of distal small bowel, suggesting chronic enteric fistula. 7. 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 8. Aortic Atherosclerosis (ICD10-I70.0).       REVIEW OF SYSTEMS:   Constitutional: Denies fevers, chills or abnormal weight loss Eyes: Denies blurriness of vision Ears, nose, mouth, throat, and face: Denies mucositis or sore throat Respiratory: Denies cough, dyspnea or wheezes Cardiovascular: Denies palpitation, chest discomfort or lower extremity swelling Gastrointestinal:  Denies nausea, heartburn or change in bowel habits Skin: Denies abnormal skin rashes Lymphatics: Denies new lymphadenopathy or easy bruising Neurological:Denies numbness, tingling or new weaknesses Behavioral/Psych: Mood is stable, no new changes  All other systems were reviewed with the patient and are negative.  I have reviewed the past medical history, past surgical history, social history and family history with the patient and they are unchanged from previous note.  ALLERGIES:  is allergic to other.  MEDICATIONS:  Current Outpatient Medications  Medication Sig Dispense Refill  . amLODipine (NORVASC) 5 MG tablet Take 1 tablet (5 mg total) by mouth daily. 30 tablet 0  . cetirizine (ZYRTEC) 10 MG tablet Take 10 mg by mouth every evening.     . Cholecalciferol (VITAMIN D-3) 125 MCG (5000 UT) TABS Take 5,000 Units by mouth daily.    . diphenoxylate-atropine (LOMOTIL) 2.5-0.025 MG tablet TAKE ONE TABLET FOUR TIMES DAILY AS NEEDED FOR DIARRHEA OR LOOSE STOOLS 30 tablet 0  . enoxaparin (LOVENOX) 100 MG/ML injection Inject 1 mL (100 mg total) into the skin every 12  (twelve) hours. 60 mL 11  . exemestane (AROMASIN) 25 MG tablet TAKE 1 TABLET BY MOUTH DAILY 30 tablet 6  . ferrous sulfate 325 (65 FE) MG tablet Take 325 mg by mouth daily with breakfast.    . lisinopril (ZESTRIL) 20 MG tablet Take 1 tablet (20 mg total) by mouth daily. 90 tablet 3  . loperamide (IMODIUM A-D) 2 MG tablet Take 2 mg by mouth 4 (four) times daily as needed.    . memantine (NAMENDA) 10 MG tablet TAKE ONE TABLET BY MOUTH TWICE DAILY (Patient taking differently: Take 10 mg by mouth 2 (two) times daily. ) 60 tablet 5  . nitrofurantoin (MACRODANTIN) 100 MG capsule Take 100 mg by mouth 2 (two) times daily.    . ondansetron (ZOFRAN) 8 MG tablet Take 1 tablet (8 mg total) by mouth every 8 (eight) hours as needed. 30 tablet 1  . Probiotic Product (PROBIOTIC PO) Take 1 capsule by mouth daily.     . prochlorperazine (COMPAZINE) 10 MG tablet Take 1 tablet (10 mg total) by mouth every 6 (six) hours as needed (Nausea or vomiting). 30 tablet 1  . rosuvastatin (CRESTOR) 10 MG tablet TAKE ONE TABLET EVERY DAY 30 tablet 2  . sertraline (ZOLOFT) 100 MG tablet Take 1 tablet (100 mg total) by mouth daily. 90 tablet 3  . Sodium Chloride Flush (NORMAL SALINE FLUSH) 0.9 % SOLN Inject 10 ml daily to each lumen, double lumen PICC so please dispense 60 prefilled syringes 600 mL 0   No  current facility-administered medications for this visit.     PHYSICAL EXAMINATION: ECOG PERFORMANCE STATUS: 2 - Symptomatic, <50% confined to bed  Vitals:   11/29/19 1127  BP: (!) 148/63  Pulse: 72  Resp: 18  Temp: 98.3 F (36.8 C)  SpO2: 95%   Filed Weights   11/29/19 1127  Weight: 222 lb 1.6 oz (100.7 kg)    GENERAL:alert, no distress and comfortable.  Limited exam on the wheelchair. SKIN: skin color, texture, turgor are normal, no rashes or significant lesions EYES: normal, Conjunctiva are pink and non-injected, sclera clear OROPHARYNX:no exudate, no erythema and lips, buccal mucosa, and tongue normal   NECK: supple, thyroid normal size, non-tender, without nodularity LYMPH:  no palpable lymphadenopathy in the cervical, axillary or inguinal.  The lymphadenopathy seen on CT is not palpable LUNGS: clear to auscultation and percussion with normal breathing effort HEART: regular rate & rhythm and no murmurs with mild bilateral lower extremity edema ABDOMEN:abdomen soft, non-tender and normal bowel sounds Musculoskeletal:no cyanosis of digits and no clubbing  NEURO: alert with fluent speech, no focal motor/sensory deficits  LABORATORY DATA:  I have reviewed the data as listed    Component Value Date/Time   NA 141 11/29/2019 1044   NA 140 09/22/2017 1003   K 4.3 11/29/2019 1044   K 3.8 09/22/2017 1003   CL 106 11/29/2019 1044   CO2 28 11/29/2019 1044   CO2 29 09/22/2017 1003   GLUCOSE 132 (H) 11/29/2019 1044   GLUCOSE 116 09/22/2017 1003   BUN 18 11/29/2019 1044   BUN 22.4 09/22/2017 1003   CREATININE 0.73 11/29/2019 1044   CREATININE 0.71 07/02/2018 1514   CREATININE 0.8 09/22/2017 1003   CALCIUM 9.4 11/29/2019 1044   CALCIUM 10.0 09/22/2017 1003   PROT 7.3 11/29/2019 1044   PROT 7.1 09/22/2017 1003   ALBUMIN 3.5 11/29/2019 1044   ALBUMIN 3.1 (L) 09/22/2017 1003   AST 13 (L) 11/29/2019 1044   AST 14 09/22/2017 1003   ALT 13 11/29/2019 1044   ALT 9 09/22/2017 1003   ALKPHOS 88 11/29/2019 1044   ALKPHOS 126 09/22/2017 1003   BILITOT <0.2 (L) 11/29/2019 1044   BILITOT 0.24 09/22/2017 1003   GFRNONAA >60 11/29/2019 1044   GFRAA >60 11/29/2019 1044    No results found for: SPEP, UPEP  Lab Results  Component Value Date   WBC 3.8 (L) 11/29/2019   NEUTROABS 2.1 11/29/2019   HGB 10.6 (L) 11/29/2019   HCT 33.0 (L) 11/29/2019   MCV 96.2 11/29/2019   PLT 92 (L) 11/29/2019      Chemistry      Component Value Date/Time   NA 141 11/29/2019 1044   NA 140 09/22/2017 1003   K 4.3 11/29/2019 1044   K 3.8 09/22/2017 1003   CL 106 11/29/2019 1044   CO2 28 11/29/2019 1044    CO2 29 09/22/2017 1003   BUN 18 11/29/2019 1044   BUN 22.4 09/22/2017 1003   CREATININE 0.73 11/29/2019 1044   CREATININE 0.71 07/02/2018 1514   CREATININE 0.8 09/22/2017 1003      Component Value Date/Time   CALCIUM 9.4 11/29/2019 1044   CALCIUM 10.0 09/22/2017 1003   ALKPHOS 88 11/29/2019 1044   ALKPHOS 126 09/22/2017 1003   AST 13 (L) 11/29/2019 1044   AST 14 09/22/2017 1003   ALT 13 11/29/2019 1044   ALT 9 09/22/2017 1003   BILITOT <0.2 (L) 11/29/2019 1044   BILITOT 0.24 09/22/2017 1003  RADIOGRAPHIC STUDIES: I have reviewed multiple imaging studies with the sister I have personally reviewed the radiological images as listed and agreed with the findings in the report. Ct Chest W Contrast  Result Date: 11/28/2019 CLINICAL DATA:  Endometrial cancer, saddle pulmonary embolus EXAM: CT CHEST, ABDOMEN, AND PELVIS WITH CONTRAST TECHNIQUE: Multidetector CT imaging of the chest, abdomen and pelvis was performed following the standard protocol during bolus administration of intravenous contrast. CONTRAST:  134m OMNIPAQUE IOHEXOL 300 MG/ML SOLN, additional oral enteric contrast COMPARISON:  09/08/2019 FINDINGS: CT CHEST FINDINGS Cardiovascular: There has been partial interval resolution of previously seen saddle pulmonary embolus, now with generally more eccentric and bandlike subacute to chronic embolus present in the left and right pulmonary arteries as well as the lower lobe branch vessels (series 3, image 135, 170, 205). Aortic atherosclerosis. Aortic valve calcification. Normal heart size. No pericardial effusion. Mediastinum/Nodes: There is an enlarged left axillary lymph node measuring 2.6 x 2.0 cm (series 2, image 3), which appears to be new when compared to prior examination although imaging of the left axilla at that time was limited by patient arm in the down position and adjacent arthroplasty. There are newly enlarged left superior mediastinal/supraclavicular lymph nodes  measuring up to 1.6 x 1.1 cm (series 2, image 4). Interval decrease in size of multiple enlarged mediastinal lymph nodes, an index AP window lymph node measuring 2.3 x 1.5 cm, previously 3.2 x 2.1 cm (series 3, image 110). Thyroid gland, trachea, and esophagus demonstrate no significant findings. Lungs/Pleura: Lungs are clear. No pleural effusion or pneumothorax. Musculoskeletal: Postlumpectomy findings of the right breast with overlying skin thickening and surgical clips in the right axilla. 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 CT ABDOMEN PELVIS FINDINGS Hepatobiliary: No solid liver abnormality is seen. Atrophic left lobe of the liver. Gallbladder is contracted about a large rim calcified gallstone. There are foci of air within the gallbladder fundus or gallbladder wall, unchanged configuration compared to prior and closely adjacent to a loop of distal small bowel, suggesting fistula. Gallbladder wall thickening, or biliary dilatation. Pancreas: Unremarkable. No pancreatic ductal dilatation or surrounding inflammatory changes. Spleen: Normal in size without significant abnormality. Adrenals/Urinary Tract: Adrenal glands are unremarkable. Kidneys are normal, without renal calculi, solid lesion, or hydronephrosis. Bladder is unremarkable. Stomach/Bowel: Stomach is within normal limits. Small incidental diverticula of the transverse and ascending duodenum. Appendix appears normal. Sigmoid diverticulosis. No evidence of bowel wall thickening, distention, or inflammatory changes. Vascular/Lymphatic: Aortic atherosclerosis. Infrarenal IVC filter. Right common iliac vein stent. Interval decrease in size of numerous enlarged retroperitoneal lymph nodes, an index left retroperitoneal node measuring 1.1 x 0.8 cm, previously 1.8 x 1.3 cm (series 3, image 462). Interval decrease in size of numerous bulky iliac and pelvic sidewall lymph nodes, a left internal iliac node measuring 0.8 x 0.6 cm,  previously 1.3 x 1.0 cm (series 3, image 635). Reproductive: Large fibroid of the uterine fundus unchanged compared to prior examination. Significant interval decrease in bulk of a mass centered about the cervix, vaginal fornix, and closely abutting the superior rectum, the largest component now measuring approximately 6.1 x 4.3 cm, previously 8.6 x 5.1 cm when measured similarly (series 2, image 96). Other: No abdominal wall hernia or abnormality. Subcutaneous injection sites about the ventral abdomen. No abdominopelvic ascites. 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 IMPRESSION: 1. Interval decrease in size of a mass centered about the cervix, vaginal fornix, and closely abutting the superior rectum, consistent with treatment response.  2. Interval decrease in size of numerous enlarged mediastinal, retroperitoneal, iliac, and pelvic sidewall lymph nodes, findings consistent with treatment response of metastatic disease. 3. There is an enlarged left axillary lymph node measuring 2.6 x 2.0 cm (series 2, image 3), which appears to be new when compared to prior examination although imaging of the left axilla at that time was limited by patient arm in the down position and adjacent arthroplasty. There are newly enlarged left superior mediastinal/supraclavicular lymph nodes measuring up to 1.6 x 1.1 cm (series 2, image 4). Findings are concerning for mixed treatment response. Attention on follow-up. If possible, recommend performing all future examinations with patient arm in the elevated position to ensure adequate evaluation of the axilla. 4. 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 No new embolus noted. 5. Unchanged postoperative and post  treatment appearance of the right breast. 6. Stable appearance of air within the gallbladder fundus or gallbladder wall, closely adjacent to a loop of distal small bowel, suggesting chronic enteric fistula. 7. 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 8. Aortic Atherosclerosis (ICD10-I70.0). Electronically Signed   By: Eddie Candle M.D.   On: 11/28/2019 09:21   Ct Abdomen Pelvis W Contrast  Result Date: 11/28/2019 CLINICAL DATA:  Endometrial cancer, saddle pulmonary embolus EXAM: CT CHEST, ABDOMEN, AND PELVIS WITH CONTRAST TECHNIQUE: Multidetector CT imaging of the chest, abdomen and pelvis was performed following the standard protocol during bolus administration of intravenous contrast. CONTRAST:  118m OMNIPAQUE IOHEXOL 300 MG/ML SOLN, additional oral enteric contrast COMPARISON:  09/08/2019 FINDINGS: CT CHEST FINDINGS Cardiovascular: There has been partial interval resolution of previously seen saddle pulmonary embolus, now with generally more eccentric and bandlike subacute to chronic embolus present in the left and right pulmonary arteries as well as the lower lobe branch vessels (series 3, image 135, 170, 205). Aortic atherosclerosis. Aortic valve calcification. Normal heart size. No pericardial effusion. Mediastinum/Nodes: There is an enlarged left axillary lymph node measuring 2.6 x 2.0 cm (series 2, image 3), which appears to be new when compared to prior examination although imaging of the left axilla at that time was limited by patient arm in the down position and adjacent arthroplasty. There are newly enlarged left superior mediastinal/supraclavicular lymph nodes measuring up to 1.6 x 1.1 cm (series 2, image 4). Interval decrease in size of multiple enlarged mediastinal lymph nodes, an index AP window lymph node measuring 2.3 x 1.5 cm, previously 3.2 x 2.1 cm (series 3, image 110). Thyroid gland, trachea, and esophagus demonstrate no significant findings. Lungs/Pleura: Lungs are clear.  No pleural effusion or pneumothorax. Musculoskeletal: Postlumpectomy findings of the right breast with overlying skin thickening and surgical clips in the right axilla. 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 CT ABDOMEN PELVIS FINDINGS Hepatobiliary: No solid liver abnormality is seen. Atrophic left lobe of the liver. Gallbladder is contracted about a large rim calcified gallstone. There are foci of air within the gallbladder fundus or gallbladder wall, unchanged configuration compared to prior and closely adjacent to a loop of distal small bowel, suggesting fistula. Gallbladder wall thickening, or biliary dilatation. Pancreas: Unremarkable. No pancreatic ductal dilatation or surrounding inflammatory changes. Spleen: Normal in size without significant abnormality. Adrenals/Urinary Tract: Adrenal glands are unremarkable. Kidneys are normal, without renal calculi, solid  lesion, or hydronephrosis. Bladder is unremarkable. Stomach/Bowel: Stomach is within normal limits. Small incidental diverticula of the transverse and ascending duodenum. Appendix appears normal. Sigmoid diverticulosis. No evidence of bowel wall thickening, distention, or inflammatory changes. Vascular/Lymphatic: Aortic atherosclerosis. Infrarenal IVC filter. Right common iliac vein stent. Interval decrease in size of numerous enlarged retroperitoneal lymph nodes, an index left retroperitoneal node measuring 1.1 x 0.8 cm, previously 1.8 x 1.3 cm (series 3, image 462). Interval decrease in size of numerous bulky iliac and pelvic sidewall lymph nodes, a left internal iliac node measuring 0.8 x 0.6 cm, previously 1.3 x 1.0 cm (series 3, image 635). Reproductive: Large fibroid of the uterine fundus unchanged compared to prior examination. Significant interval decrease in bulk of a mass centered about the cervix, vaginal fornix, and closely abutting the superior rectum, the largest component now measuring approximately 6.1 x 4.3 cm,  previously 8.6 x 5.1 cm when measured similarly (series 2, image 96). Other: No abdominal wall hernia or abnormality. Subcutaneous injection sites about the ventral abdomen. No abdominopelvic ascites. 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 IMPRESSION: 1. Interval decrease in size of a mass centered about the cervix, vaginal fornix, and closely abutting the superior rectum, consistent with treatment response. 2. Interval decrease in size of numerous enlarged mediastinal, retroperitoneal, iliac, and pelvic sidewall lymph nodes, findings consistent with treatment response of metastatic disease. 3. There is an enlarged left axillary lymph node measuring 2.6 x 2.0 cm (series 2, image 3), which appears to be new when compared to prior examination although imaging of the left axilla at that time was limited by patient arm in the down position and adjacent arthroplasty. There are newly enlarged left superior mediastinal/supraclavicular lymph nodes measuring up to 1.6 x 1.1 cm (series 2, image 4). Findings are concerning for mixed treatment response. Attention on follow-up. If possible, recommend performing all future examinations with patient arm in the elevated position to ensure adequate evaluation of the axilla. 4. 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 No new embolus noted. 5. Unchanged postoperative and post treatment appearance of the right breast. 6. Stable appearance of air within the gallbladder fundus or gallbladder wall, closely adjacent to a loop of distal small bowel, suggesting chronic enteric fistula. 7. 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 8. Aortic Atherosclerosis (ICD10-I70.0). Electronically Signed   By: Eddie Candle M.D.   On: 11/28/2019 09:21   Dvt (right)  Result Date: 11/21/2019  Lower Venous Study Risk Factors: DVT Rt LE DVT Surgery 08/15/2019: Inferior Venocavogram; Placement of a [place] IVC Filter Infrarenal. Mechanical Thrombectomy of the Right CFV, Right External iliac Vein and Right Common Eliac vein. Comparison Study: 09/02/2019 Performing Technologist: Concha Norway RVT  Examination Guidelines: A complete evaluation includes B-mode imaging, spectral Doppler, color Doppler, and power Doppler as needed of all accessible portions of each vessel. Bilateral testing is considered an integral part of a complete examination. Limited examinations for reoccurring indications may be performed as noted.  +---------+---------------+---------+-----------+----------+--------------+ RIGHT    CompressibilityPhasicitySpontaneityPropertiesThrombus Aging +---------+---------------+---------+-----------+----------+--------------+ CFV      Partial        Yes  Chronic        +---------+---------------+---------+-----------+----------+--------------+ SFJ      Full           Yes      Yes                                 +---------+---------------+---------+-----------+----------+--------------+ FV Prox  Partial        Yes                           Chronic        +---------+---------------+---------+-----------+----------+--------------+ FV Mid   Partial        Yes                           Chronic        +---------+---------------+---------+-----------+----------+--------------+ FV DistalPartial        Yes                           Chronic        +---------+---------------+---------+-----------+----------+--------------+ PFV      Full                                                        +---------+---------------+---------+-----------+----------+--------------+ POP      Partial        Yes                           Chronic         +---------+---------------+---------+-----------+----------+--------------+ PTV      Partial                                      Chronic        +---------+---------------+---------+-----------+----------+--------------+ PERO     Partial                                      Chronic        +---------+---------------+---------+-----------+----------+--------------+ GSV      Full           Yes      Yes                                 +---------+---------------+---------+-----------+----------+--------------+ SSV      Full                                                        +---------+---------------+---------+-----------+----------+--------------+     Summary: Right: 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 Only partial compression seen in the CFV,SFV, POP, and  proximal PTV and peroneal with reduce recanalized flow throughout.  *See table(s)  above for measurements and observations. Electronically signed by [name] MD on 11/21/2019 at 4:58:56 PM.    Final     All questions were answered. The patient knows to call the clinic with any problems, questions or concerns. No barriers to learning was detected.  I spent 40 minutes counseling the patient face to face. The total time spent in the appointment was 55 minutes and more than 50% was on counseling and review of test results  Heath Lark, MD 11/30/2019 8:17 AM

## 2019-11-30 NOTE — Assessment & Plan Note (Signed)
I reviewed the plan of care and discussed goals of care with her sister Overall, we agreed to proceed with further palliative chemotherapy

## 2019-11-30 NOTE — Assessment & Plan Note (Signed)
She has progressive pancytopenia I plan to reduce the dose of carboplatin

## 2019-11-30 NOTE — Assessment & Plan Note (Addendum)
I have reviewed multiple imaging studies with her sister The patient is unable to give informed decision about treatment options According to the radiologist, there is new lymphadenopathy on the left supraclavicular region and left axilla However, those areas were obscured on her previous imaging All other measurable disease not improving Overall, she tolerated chemotherapy very well I recommend we proceed with 3 more months of single agent carboplatinum and repeat imaging study Her sister agreed

## 2019-11-30 NOTE — Telephone Encounter (Signed)
Error

## 2019-11-30 NOTE — Telephone Encounter (Signed)
Completed paper work from Dr. [name] has been fwd back to medical records for processing.

## 2019-11-30 NOTE — Assessment & Plan Note (Signed)
CT imaging show partial resolution of PE She will continue Lovenox She has scant intermittent vaginal bleeding but not severe We will observe closely

## 2019-12-01 ENCOUNTER — Telehealth: Payer: Self-pay | Admitting: Hematology and Oncology

## 2019-12-01 NOTE — Telephone Encounter (Signed)
Scheduled appt per 12/8 sch message - pt sister is aware of appt date and time

## 2019-12-06 ENCOUNTER — Other Ambulatory Visit: Payer: Self-pay

## 2019-12-06 ENCOUNTER — Inpatient Hospital Stay: Payer: Medicare PPO

## 2019-12-06 DIAGNOSIS — R0602 Shortness of breath: Secondary | ICD-10-CM | POA: Diagnosis not present

## 2019-12-06 DIAGNOSIS — C55 Malignant neoplasm of uterus, part unspecified: Secondary | ICD-10-CM

## 2019-12-06 DIAGNOSIS — I2699 Other pulmonary embolism without acute cor pulmonale: Secondary | ICD-10-CM | POA: Diagnosis not present

## 2019-12-06 DIAGNOSIS — Z5111 Encounter for antineoplastic chemotherapy: Secondary | ICD-10-CM | POA: Diagnosis not present

## 2019-12-06 DIAGNOSIS — M8580 Other specified disorders of bone density and structure, unspecified site: Secondary | ICD-10-CM | POA: Diagnosis not present

## 2019-12-06 DIAGNOSIS — I700 Atherosclerosis of aorta: Secondary | ICD-10-CM | POA: Diagnosis not present

## 2019-12-06 DIAGNOSIS — Z7189 Other specified counseling: Secondary | ICD-10-CM

## 2019-12-06 DIAGNOSIS — R590 Localized enlarged lymph nodes: Secondary | ICD-10-CM | POA: Diagnosis not present

## 2019-12-06 DIAGNOSIS — D61818 Other pancytopenia: Secondary | ICD-10-CM | POA: Diagnosis not present

## 2019-12-06 DIAGNOSIS — Z452 Encounter for adjustment and management of vascular access device: Secondary | ICD-10-CM | POA: Diagnosis not present

## 2019-12-06 DIAGNOSIS — Z95828 Presence of other vascular implants and grafts: Secondary | ICD-10-CM

## 2019-12-06 MED ORDER — HEPARIN SOD (PORK) LOCK FLUSH 100 UNIT/ML IV SOLN
500.0000 [IU] | Freq: Once | INTRAVENOUS | Status: AC
  Administered 2019-12-06: 250 [IU]
  Filled 2019-12-06: qty 5

## 2019-12-06 MED ORDER — SODIUM CHLORIDE 0.9% FLUSH
10.0000 mL | Freq: Once | INTRAVENOUS | Status: AC
  Administered 2019-12-06: 10 mL
  Filled 2019-12-06: qty 10

## 2019-12-13 ENCOUNTER — Inpatient Hospital Stay: Payer: Medicare PPO

## 2019-12-13 ENCOUNTER — Other Ambulatory Visit: Payer: Self-pay

## 2019-12-13 ENCOUNTER — Other Ambulatory Visit: Payer: Self-pay | Admitting: Internal Medicine

## 2019-12-13 DIAGNOSIS — Z7189 Other specified counseling: Secondary | ICD-10-CM

## 2019-12-13 DIAGNOSIS — I2699 Other pulmonary embolism without acute cor pulmonale: Secondary | ICD-10-CM | POA: Diagnosis not present

## 2019-12-13 DIAGNOSIS — C55 Malignant neoplasm of uterus, part unspecified: Secondary | ICD-10-CM | POA: Diagnosis not present

## 2019-12-13 DIAGNOSIS — Z452 Encounter for adjustment and management of vascular access device: Secondary | ICD-10-CM | POA: Diagnosis not present

## 2019-12-13 DIAGNOSIS — R590 Localized enlarged lymph nodes: Secondary | ICD-10-CM | POA: Diagnosis not present

## 2019-12-13 DIAGNOSIS — R0602 Shortness of breath: Secondary | ICD-10-CM | POA: Diagnosis not present

## 2019-12-13 DIAGNOSIS — I700 Atherosclerosis of aorta: Secondary | ICD-10-CM | POA: Diagnosis not present

## 2019-12-13 DIAGNOSIS — Z5111 Encounter for antineoplastic chemotherapy: Secondary | ICD-10-CM | POA: Diagnosis not present

## 2019-12-13 DIAGNOSIS — D61818 Other pancytopenia: Secondary | ICD-10-CM | POA: Diagnosis not present

## 2019-12-13 DIAGNOSIS — M8580 Other specified disorders of bone density and structure, unspecified site: Secondary | ICD-10-CM | POA: Diagnosis not present

## 2019-12-13 DIAGNOSIS — Z95828 Presence of other vascular implants and grafts: Secondary | ICD-10-CM

## 2019-12-13 MED ORDER — SODIUM CHLORIDE 0.9% FLUSH
10.0000 mL | Freq: Once | INTRAVENOUS | Status: AC
  Administered 2019-12-13: 10 mL
  Filled 2019-12-13: qty 10

## 2019-12-13 MED ORDER — HEPARIN SOD (PORK) LOCK FLUSH 100 UNIT/ML IV SOLN
250.0000 [IU] | Freq: Once | INTRAVENOUS | Status: AC
  Administered 2019-12-13: 13:00:00 250 [IU]
  Filled 2019-12-13: qty 5

## 2019-12-13 MED FILL — ENOXAPARIN SODIUM 100 MG/ML: 100 | 30 days supply | Qty: 60 | Fill #1

## 2019-12-19 ENCOUNTER — Other Ambulatory Visit: Payer: Self-pay | Admitting: Internal Medicine

## 2019-12-19 NOTE — Telephone Encounter (Signed)
This Rx was faxed last week 12/14/2019.... Rx has been refaxed

## 2019-12-19 NOTE — Telephone Encounter (Signed)
Patient called in regards to refill request She stated this was sent over on the 22nd also and the pharmacy has not heard back from anyone yet. She is needing this medication before her appointment at another office tomorrow

## 2019-12-20 ENCOUNTER — Inpatient Hospital Stay: Payer: Medicare PPO

## 2019-12-20 ENCOUNTER — Inpatient Hospital Stay (HOSPITAL_BASED_OUTPATIENT_CLINIC_OR_DEPARTMENT_OTHER): Payer: Medicare PPO | Admitting: Hematology and Oncology

## 2019-12-20 ENCOUNTER — Other Ambulatory Visit: Payer: Self-pay

## 2019-12-20 VITALS — BP 127/74 | HR 66 | Temp 98.2°F | Resp 17 | Ht 61.0 in | Wt 226.7 lb

## 2019-12-20 DIAGNOSIS — Z7189 Other specified counseling: Secondary | ICD-10-CM

## 2019-12-20 DIAGNOSIS — Z95828 Presence of other vascular implants and grafts: Secondary | ICD-10-CM

## 2019-12-20 DIAGNOSIS — R413 Other amnesia: Secondary | ICD-10-CM | POA: Diagnosis not present

## 2019-12-20 DIAGNOSIS — C55 Malignant neoplasm of uterus, part unspecified: Secondary | ICD-10-CM | POA: Diagnosis not present

## 2019-12-20 DIAGNOSIS — M8580 Other specified disorders of bone density and structure, unspecified site: Secondary | ICD-10-CM | POA: Diagnosis not present

## 2019-12-20 DIAGNOSIS — I2692 Saddle embolus of pulmonary artery without acute cor pulmonale: Secondary | ICD-10-CM

## 2019-12-20 DIAGNOSIS — Z452 Encounter for adjustment and management of vascular access device: Secondary | ICD-10-CM | POA: Diagnosis not present

## 2019-12-20 DIAGNOSIS — Z170 Estrogen receptor positive status [ER+]: Secondary | ICD-10-CM

## 2019-12-20 DIAGNOSIS — C50311 Malignant neoplasm of lower-inner quadrant of right female breast: Secondary | ICD-10-CM

## 2019-12-20 DIAGNOSIS — I700 Atherosclerosis of aorta: Secondary | ICD-10-CM | POA: Diagnosis not present

## 2019-12-20 DIAGNOSIS — D61818 Other pancytopenia: Secondary | ICD-10-CM | POA: Diagnosis not present

## 2019-12-20 DIAGNOSIS — D539 Nutritional anemia, unspecified: Secondary | ICD-10-CM

## 2019-12-20 DIAGNOSIS — R590 Localized enlarged lymph nodes: Secondary | ICD-10-CM | POA: Diagnosis not present

## 2019-12-20 DIAGNOSIS — Z5111 Encounter for antineoplastic chemotherapy: Secondary | ICD-10-CM | POA: Diagnosis not present

## 2019-12-20 DIAGNOSIS — R0602 Shortness of breath: Secondary | ICD-10-CM | POA: Diagnosis not present

## 2019-12-20 DIAGNOSIS — I2699 Other pulmonary embolism without acute cor pulmonale: Secondary | ICD-10-CM | POA: Diagnosis not present

## 2019-12-20 LAB — CBC WITH DIFFERENTIAL (CANCER CENTER ONLY)
Abs Immature Granulocytes: 0 10*3/uL (ref 0.00–0.07)
Basophils Absolute: 0 10*3/uL (ref 0.0–0.1)
Basophils Relative: 0 %
Eosinophils Absolute: 0 10*3/uL (ref 0.0–0.5)
Eosinophils Relative: 1 %
HCT: 31 % — ABNORMAL LOW (ref 36.0–46.0)
Hemoglobin: 9.9 g/dL — ABNORMAL LOW (ref 12.0–15.0)
Immature Granulocytes: 0 %
Lymphocytes Relative: 47 %
Lymphs Abs: 1.4 10*3/uL (ref 0.7–4.0)
MCH: 32.2 pg (ref 26.0–34.0)
MCHC: 31.9 g/dL (ref 30.0–36.0)
MCV: 101 fL — ABNORMAL HIGH (ref 80.0–100.0)
Monocytes Absolute: 0.3 10*3/uL (ref 0.1–1.0)
Monocytes Relative: 11 %
Neutro Abs: 1.2 10*3/uL — ABNORMAL LOW (ref 1.7–7.7)
Neutrophils Relative %: 41 %
Platelet Count: 129 10*3/uL — ABNORMAL LOW (ref 150–400)
RBC: 3.07 MIL/uL — ABNORMAL LOW (ref 3.87–5.11)
RDW: 18.5 % — ABNORMAL HIGH (ref 11.5–15.5)
WBC Count: 2.9 10*3/uL — ABNORMAL LOW (ref 4.0–10.5)
nRBC: 0 % (ref 0.0–0.2)

## 2019-12-20 LAB — CMP (CANCER CENTER ONLY)
ALT: 9 U/L (ref 0–44)
AST: 11 U/L — ABNORMAL LOW (ref 15–41)
Albumin: 3.4 g/dL — ABNORMAL LOW (ref 3.5–5.0)
Alkaline Phosphatase: 90 U/L (ref 38–126)
Anion gap: 8 (ref 5–15)
BUN: 19 mg/dL (ref 8–23)
CO2: 27 mmol/L (ref 22–32)
Calcium: 9.3 mg/dL (ref 8.9–10.3)
Chloride: 105 mmol/L (ref 98–111)
Creatinine: 0.69 mg/dL (ref 0.44–1.00)
GFR, Est AFR Am: >60 mL/min (ref >60)
GFR, Estimated: >60 mL/min (ref >60)
Glucose, Bld: 138 mg/dL — ABNORMAL HIGH (ref 70–99)
Potassium: 4.1 mmol/L (ref 3.5–5.1)
Sodium: 140 mmol/L (ref 135–145)
Total Bilirubin: <0.2 mg/dL — ABNORMAL LOW (ref 0.3–1.2)
Total Protein: 7.1 g/dL (ref 6.5–8.1)

## 2019-12-20 LAB — VITAMIN B12: Vitamin B-12: 178 pg/mL — ABNORMAL LOW (ref 180–914)

## 2019-12-20 MED ORDER — PALONOSETRON HCL INJECTION 0.25 MG/5ML
0.2500 mg | Freq: Once | INTRAVENOUS | Status: AC
  Administered 2019-12-20: 0.25 mg via INTRAVENOUS

## 2019-12-20 MED ORDER — SODIUM CHLORIDE 0.9 % IV SOLN
Freq: Once | INTRAVENOUS | Status: AC
  Filled 2019-12-20: qty 5

## 2019-12-20 MED ORDER — SODIUM CHLORIDE 0.9 % IV SOLN
Freq: Once | INTRAVENOUS | Status: AC
  Filled 2019-12-20: qty 250

## 2019-12-20 MED ORDER — SODIUM CHLORIDE 0.9 % IV SOLN
400.0000 mg | Freq: Once | INTRAVENOUS | Status: AC
  Administered 2019-12-20: 400 mg via INTRAVENOUS
  Filled 2019-12-20: qty 40

## 2019-12-20 MED ORDER — SODIUM CHLORIDE 0.9% FLUSH
10.0000 mL | INTRAVENOUS | Status: DC | PRN
  Administered 2019-12-20: 10 mL
  Filled 2019-12-20: qty 10

## 2019-12-20 MED ORDER — SODIUM CHLORIDE 0.9% FLUSH
10.0000 mL | Freq: Once | INTRAVENOUS | Status: AC
  Administered 2019-12-20: 10 mL
  Filled 2019-12-20: qty 10

## 2019-12-20 MED ORDER — HEPARIN SOD (PORK) LOCK FLUSH 100 UNIT/ML IV SOLN
250.0000 [IU] | Freq: Once | INTRAVENOUS | Status: AC | PRN
  Administered 2019-12-20: 250 [IU]
  Filled 2019-12-20: qty 5

## 2019-12-20 MED ORDER — PALONOSETRON HCL INJECTION 0.25 MG/5ML
INTRAVENOUS | Status: AC
  Filled 2019-12-20: qty 5

## 2019-12-20 NOTE — Patient Instructions (Signed)
[facility] Discharge Instructions for Patients Receiving Chemotherapy  Today you received the following chemotherapy agents:  Carboplatin  To help prevent nausea and vomiting after your treatment, we encourage you to take your nausea medication as directed by MD   If you develop nausea and vomiting that is not controlled by your nausea medication, call the clinic.   BELOW ARE SYMPTOMS THAT SHOULD BE REPORTED IMMEDIATELY:  *FEVER GREATER THAN 100.5 F  *CHILLS WITH OR WITHOUT FEVER  NAUSEA AND VOMITING THAT IS NOT CONTROLLED WITH YOUR NAUSEA MEDICATION  *UNUSUAL SHORTNESS OF BREATH  *UNUSUAL BRUISING OR BLEEDING  TENDERNESS IN MOUTH AND THROAT WITH OR WITHOUT PRESENCE OF ULCERS  *URINARY PROBLEMS  *BOWEL PROBLEMS  UNUSUAL RASH Items with * indicate a potential emergency and should be followed up as soon as possible.  Feel free to call the clinic should you have any questions or concerns. The clinic phone number is [phone].  Please show the [facility] at check-in to the Emergency Department and triage nurse.

## 2019-12-21 ENCOUNTER — Encounter: Payer: Self-pay | Admitting: Hematology and Oncology

## 2019-12-21 ENCOUNTER — Telehealth: Payer: Self-pay | Admitting: Hematology and Oncology

## 2019-12-21 ENCOUNTER — Other Ambulatory Visit: Payer: Self-pay | Admitting: Hematology and Oncology

## 2019-12-21 DIAGNOSIS — S42202A Unspecified fracture of upper end of left humerus, initial encounter for closed fracture: Secondary | ICD-10-CM | POA: Diagnosis not present

## 2019-12-21 DIAGNOSIS — Z4789 Encounter for other orthopedic aftercare: Secondary | ICD-10-CM | POA: Diagnosis not present

## 2019-12-21 LAB — FERRITIN: Ferritin: 152 ng/mL (ref 11–307)

## 2019-12-21 LAB — IRON AND TIBC
Iron: 116 ug/dL (ref 41–142)
Saturation Ratios: 35 % (ref 21–57)
TIBC: 335 ug/dL (ref 236–444)
UIBC: 219 ug/dL (ref 120–384)

## 2019-12-21 NOTE — Telephone Encounter (Signed)
Scheduled appt per 12/30 sch message - pt sister aware of appt date and time

## 2019-12-21 NOTE — Assessment & Plan Note (Signed)
Her recent CT imaging show partial resolution of PE She will continue Lovenox We will observe closely

## 2019-12-21 NOTE — Assessment & Plan Note (Signed)
The patient is not able to make informed medical decisions Her sister who is present is in agreement with the plan of care

## 2019-12-21 NOTE — Assessment & Plan Note (Addendum)
She has persistent pancytopenia despite recent dose adjustment I suspect nutritional deficiency Repeat iron studies and vitamin B12 level showed Vitamin B12 deficiency Recommend intramuscular vitamin B12 replacement therapy weekly x4 and then every 3 months  I will coordinate intravenous vitamin B12 injection along with her PICC line flushes next few weeks.

## 2019-12-21 NOTE — Assessment & Plan Note (Signed)
She tolerated recent treatment well except for pancytopenia which I think is due to nutritional deficiency We will proceed with similar dose adjustment as before I recommend we proceed with 3 more months of single agent carboplatinum and repeat imaging study, due around March 2021

## 2019-12-21 NOTE — Progress Notes (Signed)
[facility] OFFICE PROGRESS NOTE  Patient Care Team: [name], NP as PCP - General (Internal Medicine)  ASSESSMENT & PLAN:  Uterine cancer [facility]) She tolerated recent treatment well except for pancytopenia which I think is due to nutritional deficiency We will proceed with similar dose adjustment as before I recommend we proceed with 3 more months of single agent carboplatinum and repeat imaging study, due around March 2021  Pulmonary embolism ([place]) Her recent CT imaging show partial resolution of PE She will continue Lovenox We will observe closely  Pancytopenia, acquired ([place]) She has persistent pancytopenia despite recent dose adjustment I suspect nutritional deficiency Repeat iron studies and vitamin B12 level showed Vitamin B12 deficiency Recommend intramuscular vitamin B12 replacement therapy weekly x4 and then every 3 months  I will coordinate intravenous vitamin B12 injection along with her PICC line flushes next few weeks.   Memory disorder The patient is not able to make informed medical decisions Her sister who is present is in agreement with the plan of care   Orders Placed This Encounter  Procedures  . Vitamin B12    Standing Status:   Future    Number of Occurrences:   1    Standing Expiration Date:   01/23/2021  . Iron and TIBC    Standing Status:   Future    Number of Occurrences:   1    Standing Expiration Date:   01/23/2021  . Ferritin    Standing Status:   Future    Number of Occurrences:   1    Standing Expiration Date:   01/23/2021  . Folate RBC    Standing Status:   Future    Number of Occurrences:   1    Standing Expiration Date:   01/23/2021    INTERVAL HISTORY: Please see below for problem oriented charting. She returns for further follow-up with her sister She is doing well There were no reported recent bleeding She tolerated treatment well No recent infection, fever or chills No infusion reaction  SUMMARY OF ONCOLOGIC  HISTORY: Oncology History Overview Note  Uterine cancer  40% ER positive MSI stable    Breast cancer of lower-inner quadrant of right female breast ([place])  05/23/2016 Mammogram   Diagnostic mammogram and ultrasound showed suspicious architectural distortion within the right breast lower inner quadrant, measuring 1.3 cm, without sonographic corelate.   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 There are abnormal right axillary lymph nodes showing second cortical's, no evidence of malignancy in the left breast.   07/29/2016 Surgery   Right lumpectomy and ALND   07/29/2016 Pathology Results   Right lumpectomy showed G3 IDC, DCIS, margins (-), LVI(-).  1 of 16 nodes was  positive    07/29/2016 Miscellaneous   Mammaprint showed high risk disease, luminal type B   09/09/2016 - 11/12/2016 Adjuvant Chemotherapy   Docetaxel and Cytoxan (TC) every 3 weeks   12/24/2016 - 02/03/2017 Radiation Therapy   Adjuvant breast radiation 12/24/16 - 02/03/17 : Right Breast treated to 50.4 Gy in 28 fractions. Right Axilla treated to 45 Gy in 25 fractions.   02/17/2017 -  Anti-estrogen oral therapy   Letrozole 2.5 mg daily     03/23/2017 Imaging   DG Bone Density 03/23/17 ASSESSMENT: The BMD measured at Femur Neck Right is 0.809 g/cm2 with a T-score of -1.6. This patient is considered osteopenic according to [facility]) criteria.   09/08/2017 Mammogram   Diagnostic Mammogram 09/08/17  IMPRESSION: No mammographic evidence of malignancy in either breast, status post right lumpectomy. RECOMMENDATION: Diagnostic mammogram is suggested in 1 year. (Code:[id number]-01Y)   09/09/2018 Mammogram   09/10/2019 Mammogram IMPRESSION: No mammographic evidence of malignancy.   Uterine cancer ([place])  08/25/2019 Imaging   US pelvis Heterogeneous mass in the fundus of the uterus as well as a mass in the region of the cervix and vagina. These are felt to represent underlying neoplasm. Direct visualization is recommended as well as possible MRI of the pelvis for further delineation.   09/01/2019 Pathology Results   Endocervical biopsy - HIGH-GRADE ENDOMETRIOID ADENOCARCINOMA. SEE NOTE Immunohistochemical stains show that the tumor cells are diffusely positive for p16, p53 and vimentin. Tumor cells show focal staining for ER and are negative for monoclonal CEA. This immunophenotype is consistent with a gynecologic primary.  ER 40% positive   09/08/2019 Imaging   Ct scan of chest, abdomen and pelvis showed: 1. Acute bilateral pulmonary embolism with large saddle embolus. Dilated main pulmonary artery suggesting pulmonary hypertension. Patient will be brought immediately to the emergency department at [facility] from the lobby of the radiology department per instructions of Dr. Denman George. 2. Infiltrative poorly marginated heterogeneously enhancing 7.0 x 4.3 x 5.0 cm midline pelvic mass centered in the region of the vaginal fornix with involvement of the uterine cervix and with intimate association with the anterior rectal wall, suspicious for malignancy of uncertain primary site. 3. Extensive lymphadenopathy  throughout the mediastinum, retroperitoneum and bilateral pelvis, likely metastatic adenopathy. 4. Enlarged uterus with apparent 7.5 cm anterior uterine body solid mass, nonspecific, potentially large uterine fibroid. No adnexal masses. No ascites. 5. Cholelithiasis. Nonspecific gas in the fundal gallbladder. Consider short-term follow-up CT abdomen to exclude emphysematous cholecystitis. No biliary ductal dilatation. 6. Marked colonic diverticulosis. 7.  Aortic Atherosclerosis (ICD10-I70.0).   09/08/2019 - 09/12/2019 Hospital Admission   She was admitted to the hospital for management of severe PE.  Her anticoagulation therapy was discontinued and switched to Lovenox   09/14/2019 Cancer Staging   Staging form: Corpus Uteri - Carcinoma and Carcinosarcoma, AJCC 8th Edition - Clinical stage from 09/14/2019: FIGO Stage IVB (cT3, cN2, pM1) - Signed by Heath Lark, MD on 09/14/2019   09/21/2019 Procedure   Successful left arm power PICC line placement with ultrasound and fluoroscopic guidance. The catheter is ready for use.   09/26/2019 -  Chemotherapy   The patient had carboplatin for chemotherapy treatment.     11/28/2019 Imaging   Ct chest, abdomen and pelvis 1. Interval decrease in size of a mass centered about the cervix, vaginal fornix, and closely abutting the superior rectum, consistent with treatment response. 2. Interval decrease in size of numerous enlarged mediastinal, retroperitoneal, iliac, and pelvic sidewall lymph  nodes, findings consistent with treatment response of metastatic disease. 3. There is an enlarged left axillary lymph node measuring 2.6 x 2.0 cm (series 2, image 3), which appears to be new when compared to prior examination although imaging of the left axilla at that time was limited by patient arm in the down position and adjacent arthroplasty. There are newly enlarged left superior mediastinal/supraclavicular lymph nodes measuring up to 1.6 x 1.1 cm (series 2, image 4).  Findings are concerning for mixed treatment response. Attention on follow-up. If possible, recommend performing all future examinations with patient arm in the elevated position to ensure adequate evaluation of the axilla. 4. 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 No new embolus noted. 5. Unchanged postoperative and post treatment appearance of the right breast. 6. Stable appearance of air within the gallbladder fundus or gallbladder wall, closely adjacent to a loop of distal small bowel, suggesting chronic enteric fistula. 7. 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 8. Aortic Atherosclerosis (ICD10-I70.0).       REVIEW OF SYSTEMS:   Constitutional: Denies fevers, chills or abnormal weight loss Eyes: Denies blurriness of vision Ears, nose, mouth, throat, and face: Denies mucositis or sore throat Respiratory: Denies cough, dyspnea or wheezes Cardiovascular: Denies palpitation, chest discomfort or lower extremity swelling Gastrointestinal:  Denies nausea, heartburn or change in bowel habits Skin: Denies abnormal skin rashes Lymphatics: Denies new lymphadenopathy or easy bruising Neurological:Denies numbness, tingling or new weaknesses Behavioral/Psych: Mood is stable, no new changes  All other systems were reviewed with the patient and are negative.  I have reviewed the past medical history, past surgical history, social history and family history with the patient and they are unchanged from previous note.  ALLERGIES:  is allergic to other.  MEDICATIONS:  Current Outpatient Medications  Medication Sig Dispense Refill  . amLODipine (NORVASC) 5 MG tablet Take 1 tablet (5 mg total) by mouth daily. 30 tablet 0  . cetirizine (ZYRTEC)  10 MG tablet Take 10 mg by mouth every evening.     . Cholecalciferol (VITAMIN D-3) 125 MCG (5000 UT) TABS Take 5,000 Units by mouth daily.    . diphenoxylate-atropine (LOMOTIL) 2.5-0.025 MG tablet TAKE 1 TABLET BY MOUTH FOUR TIMES DAILY AS NEEDED FOR DIARRHEA OR LOOSE STOOLS 30 tablet 0  . enoxaparin (LOVENOX) 100 MG/ML injection Inject 1 mL (100 mg total) into the skin every 12 (twelve) hours. 60 mL 11  . exemestane (AROMASIN) 25 MG tablet TAKE 1 TABLET BY MOUTH DAILY 30 tablet 6  . ferrous sulfate 325 (65 FE) MG tablet Take 325 mg by mouth daily with breakfast.    . lisinopril (ZESTRIL) 20 MG tablet Take 1 tablet (20 mg total) by mouth daily. 90 tablet 3  . loperamide (IMODIUM A-D) 2 MG tablet Take 2 mg by mouth 4 (four) times daily as needed.    . memantine (NAMENDA) 10 MG tablet TAKE ONE TABLET BY MOUTH TWICE DAILY (Patient taking differently: Take 10 mg by mouth 2 (two) times daily. ) 60 tablet 5  . nitrofurantoin (MACRODANTIN) 100 MG capsule Take 100 mg by mouth 2 (two) times daily.    . ondansetron (ZOFRAN) 8 MG tablet Take 1 tablet (8 mg total) by mouth every 8 (eight) hours as needed. 30 tablet  1  . Probiotic Product (PROBIOTIC PO) Take 1 capsule by mouth daily.     . prochlorperazine (COMPAZINE) 10 MG tablet Take 1 tablet (10 mg total) by mouth every 6 (six) hours as needed (Nausea or vomiting). 30 tablet 1  . rosuvastatin (CRESTOR) 10 MG tablet TAKE ONE TABLET EVERY DAY 30 tablet 2  . sertraline (ZOLOFT) 100 MG tablet Take 1 tablet (100 mg total) by mouth daily. 90 tablet 3  . Sodium Chloride Flush (NORMAL SALINE FLUSH) 0.9 % SOLN Inject 10 ml daily to each lumen, double lumen PICC so please dispense 60 prefilled syringes 600 mL 0   No current facility-administered medications for this visit.    PHYSICAL EXAMINATION: ECOG PERFORMANCE STATUS: 2 - Symptomatic, <50% confined to bed  Vitals:   12/20/19 1338  BP: 127/74  Pulse: 66  Resp: 17  Temp: 98.2 F (36.8 C)  SpO2: 100%    Filed Weights   12/20/19 1338  Weight: 226 lb 11.2 oz (102.8 kg)    GENERAL:alert, no distress and comfortable SKIN: skin color, texture, turgor are normal, no rashes or significant lesions EYES: normal, Conjunctiva are pink and non-injected, sclera clear OROPHARYNX:no exudate, no erythema and lips, buccal mucosa, and tongue normal  NECK: supple, thyroid normal size, non-tender, without nodularity LYMPH:  no palpable lymphadenopathy in the cervical, axillary or inguinal LUNGS: clear to auscultation and percussion with normal breathing effort HEART: regular rate & rhythm and no murmurs and no lower extremity edema ABDOMEN:abdomen soft, non-tender and normal bowel sounds Musculoskeletal:no cyanosis of digits and no clubbing  NEURO: alert & oriented x 3 with fluent speech, no focal motor/sensory deficits  LABORATORY DATA:  I have reviewed the data as listed    Component Value Date/Time   NA 140 12/20/2019 1317   NA 140 09/22/2017 1003   K 4.1 12/20/2019 1317   K 3.8 09/22/2017 1003   CL 105 12/20/2019 1317   CO2 27 12/20/2019 1317   CO2 29 09/22/2017 1003   GLUCOSE 138 (H) 12/20/2019 1317   GLUCOSE 116 09/22/2017 1003   BUN 19 12/20/2019 1317   BUN 22.4 09/22/2017 1003   CREATININE 0.69 12/20/2019 1317   CREATININE 0.71 07/02/2018 1514   CREATININE 0.8 09/22/2017 1003   CALCIUM 9.3 12/20/2019 1317   CALCIUM 10.0 09/22/2017 1003   PROT 7.1 12/20/2019 1317   PROT 7.1 09/22/2017 1003   ALBUMIN 3.4 (L) 12/20/2019 1317   ALBUMIN 3.1 (L) 09/22/2017 1003   AST 11 (L) 12/20/2019 1317   AST 14 09/22/2017 1003   ALT 9 12/20/2019 1317   ALT 9 09/22/2017 1003   ALKPHOS 90 12/20/2019 1317   ALKPHOS 126 09/22/2017 1003   BILITOT <0.2 (L) 12/20/2019 1317   BILITOT 0.24 09/22/2017 1003   GFRNONAA >60 12/20/2019 1317   GFRAA >60 12/20/2019 1317    No results found for: SPEP, UPEP  Lab Results  Component Value Date   WBC 2.9 (L) 12/20/2019   NEUTROABS 1.2 (L) 12/20/2019    HGB 9.9 (L) 12/20/2019   HCT 31.0 (L) 12/20/2019   MCV 101.0 (H) 12/20/2019   PLT 129 (L) 12/20/2019      Chemistry      Component Value Date/Time   NA 140 12/20/2019 1317   NA 140 09/22/2017 1003   K 4.1 12/20/2019 1317   K 3.8 09/22/2017 1003   CL 105 12/20/2019 1317   CO2 27 12/20/2019 1317   CO2 29 09/22/2017 1003   BUN 19 12/20/2019 1317  BUN 22.4 09/22/2017 1003   CREATININE 0.69 12/20/2019 1317   CREATININE 0.71 07/02/2018 1514   CREATININE 0.8 09/22/2017 1003      Component Value Date/Time   CALCIUM 9.3 12/20/2019 1317   CALCIUM 10.0 09/22/2017 1003   ALKPHOS 90 12/20/2019 1317   ALKPHOS 126 09/22/2017 1003   AST 11 (L) 12/20/2019 1317   AST 14 09/22/2017 1003   ALT 9 12/20/2019 1317   ALT 9 09/22/2017 1003   BILITOT <0.2 (L) 12/20/2019 1317   BILITOT 0.24 09/22/2017 1003       RADIOGRAPHIC STUDIES: I have personally reviewed the radiological images as listed and agreed with the findings in the report. CT Chest W Contrast  Result Date: 11/28/2019 CLINICAL DATA:  Endometrial cancer, saddle pulmonary embolus EXAM: CT CHEST, ABDOMEN, AND PELVIS WITH CONTRAST TECHNIQUE: Multidetector CT imaging of the chest, abdomen and pelvis was performed following the standard protocol during bolus administration of intravenous contrast. CONTRAST:  143m OMNIPAQUE IOHEXOL 300 MG/ML SOLN, additional oral enteric contrast COMPARISON:  09/08/2019 FINDINGS: CT CHEST FINDINGS Cardiovascular: There has been partial interval resolution of previously seen saddle pulmonary embolus, now with generally more eccentric and bandlike subacute to chronic embolus present in the left and right pulmonary arteries as well as the lower lobe branch vessels (series 3, image 135, 170, 205). Aortic atherosclerosis. Aortic valve calcification. Normal heart size. No pericardial effusion. Mediastinum/Nodes: There is an enlarged left axillary lymph node measuring 2.6 x 2.0 cm (series 2, image 3), which appears to  be new when compared to prior examination although imaging of the left axilla at that time was limited by patient arm in the down position and adjacent arthroplasty. There are newly enlarged left superior mediastinal/supraclavicular lymph nodes measuring up to 1.6 x 1.1 cm (series 2, image 4). Interval decrease in size of multiple enlarged mediastinal lymph nodes, an index AP window lymph node measuring 2.3 x 1.5 cm, previously 3.2 x 2.1 cm (series 3, image 110). Thyroid gland, trachea, and esophagus demonstrate no significant findings. Lungs/Pleura: Lungs are clear. No pleural effusion or pneumothorax. Musculoskeletal: Postlumpectomy findings of the right breast with overlying skin thickening and surgical clips in the right axilla. 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 CT ABDOMEN PELVIS FINDINGS Hepatobiliary: No solid liver abnormality is seen. Atrophic left lobe of the liver. Gallbladder is contracted about a large rim calcified gallstone. There are foci of air within the gallbladder fundus or gallbladder wall, unchanged configuration compared to prior and closely adjacent to a loop of distal small bowel, suggesting fistula. Gallbladder wall thickening, or biliary dilatation. Pancreas: Unremarkable. No pancreatic ductal dilatation or surrounding inflammatory changes. Spleen: Normal in size without significant abnormality. Adrenals/Urinary Tract: Adrenal glands are unremarkable. Kidneys are normal, without renal calculi, solid lesion, or hydronephrosis. Bladder is unremarkable. Stomach/Bowel: Stomach is within normal limits. Small incidental diverticula of the transverse and ascending duodenum. Appendix appears normal. Sigmoid diverticulosis. No evidence of bowel wall thickening, distention, or inflammatory changes. Vascular/Lymphatic: Aortic atherosclerosis. Infrarenal IVC filter. Right common iliac vein stent. Interval decrease in size of numerous enlarged retroperitoneal lymph nodes, an  index left retroperitoneal node measuring 1.1 x 0.8 cm, previously 1.8 x 1.3 cm (series 3, image 462). Interval decrease in size of numerous bulky iliac and pelvic sidewall lymph nodes, a left internal iliac node measuring 0.8 x 0.6 cm, previously 1.3 x 1.0 cm (series 3, image 635). Reproductive: Large fibroid of the uterine fundus unchanged compared to prior examination. Significant interval decrease in  bulk of a mass centered about the cervix, vaginal fornix, and closely abutting the superior rectum, the largest component now measuring approximately 6.1 x 4.3 cm, previously 8.6 x 5.1 cm when measured similarly (series 2, image 96). Other: No abdominal wall hernia or abnormality. Subcutaneous injection sites about the ventral abdomen. No abdominopelvic ascites. 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 IMPRESSION: 1. Interval decrease in size of a mass centered about the cervix, vaginal fornix, and closely abutting the superior rectum, consistent with treatment response. 2. Interval decrease in size of numerous enlarged mediastinal, retroperitoneal, iliac, and pelvic sidewall lymph nodes, findings consistent with treatment response of metastatic disease. 3. There is an enlarged left axillary lymph node measuring 2.6 x 2.0 cm (series 2, image 3), which appears to be new when compared to prior examination although imaging of the left axilla at that time was limited by patient arm in the down position and adjacent arthroplasty. There are newly enlarged left superior mediastinal/supraclavicular lymph nodes measuring up to 1.6 x 1.1 cm (series 2, image 4). Findings are concerning for mixed treatment response. Attention on follow-up. If possible, recommend performing all future examinations with patient arm in the elevated position to ensure adequate evaluation of the axilla. 4. There has been partial interval resolution of previously seen saddle pulmonary embolus, now with generally more  eccentric and bandlike subacute to chronic embolus present in the left and right pulmonary arteries as well as the lower lobe branch vessels (series 3, image 135, 170, 205). No new embolus noted. 5. Unchanged postoperative and post treatment appearance of the right breast. 6. Stable appearance of air within the gallbladder fundus or gallbladder wall, closely adjacent to a loop of distal small bowel, suggesting chronic enteric fistula. 7. 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 8. Aortic Atherosclerosis (ICD10-I70.0). Electronically Signed   By: Eddie Candle M.D.   On: 11/28/2019 09:21   CT Abdomen Pelvis W Contrast  Result Date: 11/28/2019 CLINICAL DATA:  Endometrial cancer, saddle pulmonary embolus EXAM: CT CHEST, ABDOMEN, AND PELVIS WITH CONTRAST TECHNIQUE: Multidetector CT imaging of the chest, abdomen and pelvis was performed following the standard protocol during bolus administration of intravenous contrast. CONTRAST:  148m OMNIPAQUE IOHEXOL 300 MG/ML SOLN, additional oral enteric contrast COMPARISON:  09/08/2019 FINDINGS: CT CHEST FINDINGS Cardiovascular: There has been partial interval resolution of previously seen saddle pulmonary embolus, now with generally more eccentric and bandlike subacute to chronic embolus present in the left and right pulmonary arteries as well as the lower lobe branch vessels (series 3, image 135, 170, 205). Aortic atherosclerosis. Aortic valve calcification. Normal heart size. No pericardial effusion. Mediastinum/Nodes: There is an enlarged left axillary lymph node measuring 2.6 x 2.0 cm (series 2, image 3), which appears to be new when compared to prior examination although imaging of the left axilla at that time was limited by patient arm in the down position and adjacent arthroplasty. There are newly enlarged left superior mediastinal/supraclavicular lymph nodes measuring up to 1.6 x 1.1 cm (series 2, image 4). Interval decrease in size of multiple enlarged  mediastinal lymph nodes, an index AP window lymph node measuring 2.3 x 1.5 cm, previously 3.2 x 2.1 cm (series 3, image 110). Thyroid gland, trachea, and esophagus demonstrate no significant findings. Lungs/Pleura: Lungs are clear. No pleural effusion or pneumothorax. Musculoskeletal: Postlumpectomy findings of the right breast with overlying skin thickening and surgical clips in the right axilla. 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 CT ABDOMEN  PELVIS FINDINGS Hepatobiliary: No solid liver abnormality is seen. Atrophic left lobe of the liver. Gallbladder is contracted about a large rim calcified gallstone. There are foci of air within the gallbladder fundus or gallbladder wall, unchanged configuration compared to prior and closely adjacent to a loop of distal small bowel, suggesting fistula. Gallbladder wall thickening, or biliary dilatation. Pancreas: Unremarkable. No pancreatic ductal dilatation or surrounding inflammatory changes. Spleen: Normal in size without significant abnormality. Adrenals/Urinary Tract: Adrenal glands are unremarkable. Kidneys are normal, without renal calculi, solid lesion, or hydronephrosis. Bladder is unremarkable. Stomach/Bowel: Stomach is within normal limits. Small incidental diverticula of the transverse and ascending duodenum. Appendix appears normal. Sigmoid diverticulosis. No evidence of bowel wall thickening, distention, or inflammatory changes. Vascular/Lymphatic: Aortic atherosclerosis. Infrarenal IVC filter. Right common iliac vein stent. Interval decrease in size of numerous enlarged retroperitoneal lymph nodes, an index left retroperitoneal node measuring 1.1 x 0.8 cm, previously 1.8 x 1.3 cm (series 3, image 462). Interval decrease in size of numerous bulky iliac and pelvic sidewall lymph nodes, a left internal iliac node measuring 0.8 x 0.6 cm, previously 1.3 x 1.0 cm (series 3, image 635). Reproductive: Large fibroid of the uterine fundus  unchanged compared to prior examination. Significant interval decrease in bulk of a mass centered about the cervix, vaginal fornix, and closely abutting the superior rectum, the largest component now measuring approximately 6.1 x 4.3 cm, previously 8.6 x 5.1 cm when measured similarly (series 2, image 96). Other: No abdominal wall hernia or abnormality. Subcutaneous injection sites about the ventral abdomen. No abdominopelvic ascites. 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 IMPRESSION: 1. Interval decrease in size of a mass centered about the cervix, vaginal fornix, and closely abutting the superior rectum, consistent with treatment response. 2. Interval decrease in size of numerous enlarged mediastinal, retroperitoneal, iliac, and pelvic sidewall lymph nodes, findings consistent with treatment response of metastatic disease. 3. There is an enlarged left axillary lymph node measuring 2.6 x 2.0 cm (series 2, image 3), which appears to be new when compared to prior examination although imaging of the left axilla at that time was limited by patient arm in the down position and adjacent arthroplasty. There are newly enlarged left superior mediastinal/supraclavicular lymph nodes measuring up to 1.6 x 1.1 cm (series 2, image 4). Findings are concerning for mixed treatment response. Attention on follow-up. If possible, recommend performing all future examinations with patient arm in the elevated position to ensure adequate evaluation of the axilla. 4. 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 No new embolus noted. 5. Unchanged postoperative and post treatment appearance of the right breast. 6. Stable appearance of air within the gallbladder fundus or  gallbladder wall, closely adjacent to a loop of distal small bowel, suggesting chronic enteric fistula. 7. 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 8. Aortic Atherosclerosis (ICD10-I70.0). Electronically Signed   By: Eddie Candle M.D.   On: 11/28/2019 09:21   DVT (Right)  Result Date: 11/21/2019  Lower Venous Study Risk Factors: DVT Rt LE DVT Surgery 08/15/2019: Inferior Venocavogram; Placement of a Denali IVC Filter Infrarenal. Mechanical Thrombectomy of the Right CFV, Right External iliac Vein and Right Common Eliac vein. Comparison Study: 09/02/2019 Performing Technologist: Concha Norway RVT  Examination Guidelines: A complete evaluation  includes B-mode imaging, spectral Doppler, color Doppler, and power Doppler as needed of all accessible portions of each vessel. Bilateral testing is considered an integral part of a complete examination. Limited examinations for reoccurring indications may be performed as noted.  +---------+---------------+---------+-----------+----------+--------------+ RIGHT    CompressibilityPhasicitySpontaneityPropertiesThrombus Aging +---------+---------------+---------+-----------+----------+--------------+ CFV      Partial        Yes                           Chronic        +---------+---------------+---------+-----------+----------+--------------+ SFJ      Full           Yes      Yes                                 +---------+---------------+---------+-----------+----------+--------------+ FV Prox  Partial        Yes                           Chronic        +---------+---------------+---------+-----------+----------+--------------+ FV Mid   Partial        Yes                           Chronic        +---------+---------------+---------+-----------+----------+--------------+ FV DistalPartial        Yes                           Chronic        +---------+---------------+---------+-----------+----------+--------------+ PFV       Full                                                        +---------+---------------+---------+-----------+----------+--------------+ POP      Partial        Yes                           Chronic        +---------+---------------+---------+-----------+----------+--------------+ PTV      Partial                                      Chronic        +---------+---------------+---------+-----------+----------+--------------+ PERO     Partial                                      Chronic        +---------+---------------+---------+-----------+----------+--------------+ GSV      Full           Yes      Yes                                 +---------+---------------+---------+-----------+----------+--------------+ SSV      Full                                                        +---------+---------------+---------+-----------+----------+--------------+  Summary: Right: 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 Only partial compression seen in the CFV,SFV, POP, and  proximal PTV and peroneal with reduce recanalized flow throughout.  *See table(s) above for measurements and observations. Electronically signed by [name] MD on 11/21/2019 at 4:58:56 PM.    Final     All questions were answered. The patient knows to call the clinic with any problems, questions or concerns. No barriers to learning was detected.  I spent 25 minutes counseling the patient face to face. The total time spent in the appointment was 30 minutes and more than 50% was on counseling and review of test results  Heath Lark, MD 12/21/2019 10:28 AM

## 2019-12-22 LAB — FOLATE RBC
Folate, Hemolysate: 299 ng/mL
Folate, RBC: 917 ng/mL (ref >498)
Hematocrit: 32.6 % — ABNORMAL LOW (ref 34.0–46.6)

## 2019-12-27 ENCOUNTER — Other Ambulatory Visit: Payer: Self-pay | Admitting: Internal Medicine

## 2019-12-27 ENCOUNTER — Inpatient Hospital Stay: Payer: Medicare PPO | Attending: Hematology

## 2019-12-27 ENCOUNTER — Other Ambulatory Visit: Payer: Self-pay

## 2019-12-27 ENCOUNTER — Inpatient Hospital Stay: Payer: Medicare PPO

## 2019-12-27 VITALS — BP 128/78 | HR 66 | Temp 98.2°F | Resp 18

## 2019-12-27 DIAGNOSIS — Z7901 Long term (current) use of anticoagulants: Secondary | ICD-10-CM | POA: Diagnosis not present

## 2019-12-27 DIAGNOSIS — I2699 Other pulmonary embolism without acute cor pulmonale: Secondary | ICD-10-CM | POA: Insufficient documentation

## 2019-12-27 DIAGNOSIS — Z170 Estrogen receptor positive status [ER+]: Secondary | ICD-10-CM | POA: Diagnosis not present

## 2019-12-27 DIAGNOSIS — E538 Deficiency of other specified B group vitamins: Secondary | ICD-10-CM | POA: Insufficient documentation

## 2019-12-27 DIAGNOSIS — I700 Atherosclerosis of aorta: Secondary | ICD-10-CM | POA: Diagnosis not present

## 2019-12-27 DIAGNOSIS — R413 Other amnesia: Secondary | ICD-10-CM | POA: Insufficient documentation

## 2019-12-27 DIAGNOSIS — Z95828 Presence of other vascular implants and grafts: Secondary | ICD-10-CM

## 2019-12-27 DIAGNOSIS — Z79899 Other long term (current) drug therapy: Secondary | ICD-10-CM | POA: Insufficient documentation

## 2019-12-27 DIAGNOSIS — D61818 Other pancytopenia: Secondary | ICD-10-CM | POA: Diagnosis not present

## 2019-12-27 DIAGNOSIS — Z5111 Encounter for antineoplastic chemotherapy: Secondary | ICD-10-CM | POA: Insufficient documentation

## 2019-12-27 DIAGNOSIS — Z7189 Other specified counseling: Secondary | ICD-10-CM

## 2019-12-27 DIAGNOSIS — M8580 Other specified disorders of bone density and structure, unspecified site: Secondary | ICD-10-CM | POA: Insufficient documentation

## 2019-12-27 DIAGNOSIS — C543 Malignant neoplasm of fundus uteri: Secondary | ICD-10-CM | POA: Diagnosis not present

## 2019-12-27 DIAGNOSIS — C55 Malignant neoplasm of uterus, part unspecified: Secondary | ICD-10-CM

## 2019-12-27 DIAGNOSIS — C50311 Malignant neoplasm of lower-inner quadrant of right female breast: Secondary | ICD-10-CM | POA: Insufficient documentation

## 2019-12-27 MED ORDER — CYANOCOBALAMIN 1000 MCG/ML IJ SOLN
1000.0000 ug | Freq: Once | INTRAMUSCULAR | Status: AC
  Administered 2019-12-27: 11:00:00 1000 ug via INTRAMUSCULAR
  Filled 2019-12-27: qty 1

## 2019-12-27 MED ORDER — HEPARIN SOD (PORK) LOCK FLUSH 100 UNIT/ML IV SOLN
250.0000 [IU] | Freq: Once | INTRAVENOUS | Status: AC
  Administered 2019-12-27: 250 [IU]
  Filled 2019-12-27: qty 5

## 2019-12-27 MED ORDER — SODIUM CHLORIDE 0.9% FLUSH
10.0000 mL | Freq: Once | INTRAVENOUS | Status: AC
  Administered 2019-12-27: 10 mL
  Filled 2019-12-27: qty 10

## 2019-12-27 NOTE — Patient Instructions (Signed)

## 2019-12-28 ENCOUNTER — Telehealth: Payer: Self-pay | Admitting: Oncology

## 2019-12-28 NOTE — Telephone Encounter (Signed)
Tiffany Arnold called and said she has been having trouble getting [name]'s Lomotil prescription refilled through her PCP.  Advised her that Dr. Alvy Bimler is out this afternoon and to let us know first thing tomorrow morning if they haven't gotten the refill yet.

## 2020-01-03 ENCOUNTER — Inpatient Hospital Stay: Payer: Medicare PPO

## 2020-01-03 ENCOUNTER — Other Ambulatory Visit: Payer: Self-pay

## 2020-01-03 VITALS — BP 117/56 | HR 71 | Temp 98.3°F | Resp 16

## 2020-01-03 DIAGNOSIS — Z170 Estrogen receptor positive status [ER+]: Secondary | ICD-10-CM | POA: Diagnosis not present

## 2020-01-03 DIAGNOSIS — I2699 Other pulmonary embolism without acute cor pulmonale: Secondary | ICD-10-CM | POA: Diagnosis not present

## 2020-01-03 DIAGNOSIS — D61818 Other pancytopenia: Secondary | ICD-10-CM | POA: Diagnosis not present

## 2020-01-03 DIAGNOSIS — Z7189 Other specified counseling: Secondary | ICD-10-CM

## 2020-01-03 DIAGNOSIS — Z95828 Presence of other vascular implants and grafts: Secondary | ICD-10-CM

## 2020-01-03 DIAGNOSIS — C543 Malignant neoplasm of fundus uteri: Secondary | ICD-10-CM | POA: Diagnosis not present

## 2020-01-03 DIAGNOSIS — C50311 Malignant neoplasm of lower-inner quadrant of right female breast: Secondary | ICD-10-CM | POA: Diagnosis not present

## 2020-01-03 DIAGNOSIS — C55 Malignant neoplasm of uterus, part unspecified: Secondary | ICD-10-CM

## 2020-01-03 DIAGNOSIS — E538 Deficiency of other specified B group vitamins: Secondary | ICD-10-CM | POA: Diagnosis not present

## 2020-01-03 DIAGNOSIS — I700 Atherosclerosis of aorta: Secondary | ICD-10-CM | POA: Diagnosis not present

## 2020-01-03 DIAGNOSIS — Z5111 Encounter for antineoplastic chemotherapy: Secondary | ICD-10-CM | POA: Diagnosis not present

## 2020-01-03 DIAGNOSIS — R413 Other amnesia: Secondary | ICD-10-CM | POA: Diagnosis not present

## 2020-01-03 MED ORDER — HEPARIN SOD (PORK) LOCK FLUSH 100 UNIT/ML IV SOLN
250.0000 [IU] | Freq: Once | INTRAVENOUS | Status: AC
  Administered 2020-01-03: 250 [IU]
  Filled 2020-01-03: qty 5

## 2020-01-03 MED ORDER — SODIUM CHLORIDE 0.9% FLUSH
10.0000 mL | Freq: Once | INTRAVENOUS | Status: AC
  Administered 2020-01-03: 10 mL
  Filled 2020-01-03: qty 10

## 2020-01-03 MED ORDER — HEPARIN SOD (PORK) LOCK FLUSH 100 UNIT/ML IV SOLN
500.0000 [IU] | Freq: Once | INTRAVENOUS | Status: DC
  Filled 2020-01-03: qty 5

## 2020-01-03 MED ORDER — CYANOCOBALAMIN 1000 MCG/ML IJ SOLN
1000.0000 ug | Freq: Once | INTRAMUSCULAR | Status: AC
  Administered 2020-01-03: 1000 ug via INTRAMUSCULAR
  Filled 2020-01-03: qty 1

## 2020-01-03 NOTE — Patient Instructions (Signed)
Cyanocobalamin, Vitamin B12 injection What is this medicine? CYANOCOBALAMIN (sye an oh koe BAL a min) is a man made form of vitamin B12. Vitamin B12 is used in the growth of healthy blood cells, nerve cells, and proteins in the body. It also helps with the metabolism of fats and carbohydrates. This medicine is used to treat people who can not absorb vitamin B12. This medicine may be used for other purposes; ask your health care provider or pharmacist if you have questions. COMMON BRAND NAME(S): B-12 Compliance Kit, B-12 Injection Kit, Cyomin, LA-12, Nutri-Twelve, Physicians EZ Use B-12, Primabalt What should I tell my health care provider before I take this medicine? They need to know if you have any of these conditions:  kidney disease  Leber's disease  megaloblastic anemia  an unusual or allergic reaction to cyanocobalamin, cobalt, other medicines, foods, dyes, or preservatives  pregnant or trying to get pregnant  breast-feeding How should I use this medicine? This medicine is injected into a muscle or deeply under the skin. It is usually given by a health care professional in a clinic or doctor's office. However, your doctor may teach you how to inject yourself. Follow all instructions. Talk to your pediatrician regarding the use of this medicine in children. Special care may be needed. Overdosage: If you think you have taken too much of this medicine contact a poison control center or emergency room at once. NOTE: This medicine is only for you. Do not share this medicine with others. What if I miss a dose? If you are given your dose at a clinic or doctor's office, call to reschedule your appointment. If you give your own injections and you miss a dose, take it as soon as you can. If it is almost time for your next dose, take only that dose. Do not take double or extra doses. What may interact with this medicine?  colchicine  heavy alcohol intake This list may not describe all  possible interactions. Give your health care provider a list of all the medicines, herbs, non-prescription drugs, or dietary supplements you use. Also tell them if you smoke, drink alcohol, or use illegal drugs. Some items may interact with your medicine. What should I watch for while using this medicine? Visit your doctor or health care professional regularly. You may need blood work done while you are taking this medicine. You may need to follow a special diet. Talk to your doctor. Limit your alcohol intake and avoid smoking to get the best benefit. What side effects may I notice from receiving this medicine? Side effects that you should report to your doctor or health care professional as soon as possible:  allergic reactions like skin rash, itching or hives, swelling of the face, lips, or tongue  blue tint to skin  chest tightness, pain  difficulty breathing, wheezing  dizziness  red, swollen painful area on the leg Side effects that usually do not require medical attention (report to your doctor or health care professional if they continue or are bothersome):  diarrhea  headache This list may not describe all possible side effects. Call your doctor for medical advice about side effects. You may report side effects to FDA at 1-800-FDA-1088. Where should I keep my medicine? Keep out of the reach of children. Store at room temperature between 15 and 30 degrees C (59 and 85 degrees F). Protect from light. Throw away any unused medicine after the expiration date. NOTE: This sheet is a summary. It may not cover  all possible information. If you have questions about this medicine, talk to your doctor, pharmacist, or health care provider.  2020 Elsevier/Gold Standard (2008-03-20 22:10:20)  Coronavirus (COVID-19) Are you at risk?  Are you at risk for the Coronavirus (COVID-19)?  To be considered HIGH RISK for Coronavirus (COVID-19), you have to meet the following criteria:  . Traveled  to [place], [place], [place], [place] or [place]; or in the [place] to [place], [place], [place], or [place]; and have fever, cough, and shortness of breath within the last 2 weeks of travel OR . Been in close contact with a person diagnosed with COVID-19 within the last 2 weeks and have fever, cough, and shortness of breath . IF YOU DO NOT MEET THESE CRITERIA, YOU ARE CONSIDERED LOW RISK FOR COVID-19.  What to do if you are HIGH RISK for COVID-19?  Marland Kitchen If you are having a medical emergency, call 911. . Seek medical care right away. Before you go to a doctor's office, urgent care or emergency department, call ahead and tell them about your recent travel, contact with someone diagnosed with COVID-19, and your symptoms. You should receive instructions from your physician's office regarding next steps of care.  . When you arrive at healthcare provider, tell the healthcare staff immediately you have returned from visiting [place], [place], [place], [place] or [place]; or traveled in the [place] to [place], [place], [place], or [place]; in the last two weeks or you have been in close contact with a person diagnosed with COVID-19 in the last 2 weeks.   . Tell the health care staff about your symptoms: fever, cough and shortness of breath. . After you have been seen by a medical provider, you will be either: o Tested for (COVID-19) and discharged home on quarantine except to seek medical care if symptoms worsen, and asked to  - Stay home and avoid contact with others until you get your results (4-5 days)  - Avoid travel on public transportation if possible (such as bus, train, or airplane) or o Sent to the Emergency Department by EMS for evaluation, COVID-19 testing, and possible admission depending on your condition and test results.  What to do if you are LOW RISK for COVID-19?  Reduce your risk of any infection by using the same precautions used for avoiding the common cold or  flu:  Marland Kitchen Wash your hands often with soap and warm water for at least 20 seconds.  If soap and water are not readily available, use an alcohol-based hand sanitizer with at least 60% alcohol.  . If coughing or sneezing, cover your mouth and nose by coughing or sneezing into the elbow areas of your shirt or coat, into a tissue or into your sleeve (not your hands). . Avoid shaking hands with others and consider head nods or verbal greetings only. . Avoid touching your eyes, nose, or mouth with unwashed hands.  . Avoid close contact with people who are sick. . Avoid places or events with large numbers of people in one location, like concerts or sporting events. . Carefully consider travel plans you have or are making. . If you are planning any travel outside or inside the [place], visit the CDC's Travelers' Health webpage for the latest health notices. . If you have some symptoms but not all symptoms, continue to monitor at home and seek medical attention if your symptoms worsen. . If you are having a medical emergency, call 911.   ADDITIONAL  [place] / e-Visit: [email or web address]         [facility]: [facility]: [phone]                   [facility]: [phone]

## 2020-01-05 ENCOUNTER — Other Ambulatory Visit: Payer: Self-pay | Admitting: Internal Medicine

## 2020-01-10 ENCOUNTER — Inpatient Hospital Stay (HOSPITAL_BASED_OUTPATIENT_CLINIC_OR_DEPARTMENT_OTHER): Payer: Medicare PPO | Admitting: Hematology and Oncology

## 2020-01-10 ENCOUNTER — Telehealth: Payer: Self-pay | Admitting: Hematology and Oncology

## 2020-01-10 ENCOUNTER — Other Ambulatory Visit: Payer: Self-pay

## 2020-01-10 ENCOUNTER — Inpatient Hospital Stay: Payer: Medicare PPO

## 2020-01-10 DIAGNOSIS — C50311 Malignant neoplasm of lower-inner quadrant of right female breast: Secondary | ICD-10-CM | POA: Diagnosis not present

## 2020-01-10 DIAGNOSIS — D61818 Other pancytopenia: Secondary | ICD-10-CM | POA: Diagnosis not present

## 2020-01-10 DIAGNOSIS — C55 Malignant neoplasm of uterus, part unspecified: Secondary | ICD-10-CM | POA: Diagnosis not present

## 2020-01-10 DIAGNOSIS — Z170 Estrogen receptor positive status [ER+]: Secondary | ICD-10-CM | POA: Diagnosis not present

## 2020-01-10 DIAGNOSIS — Z95828 Presence of other vascular implants and grafts: Secondary | ICD-10-CM

## 2020-01-10 DIAGNOSIS — I2699 Other pulmonary embolism without acute cor pulmonale: Secondary | ICD-10-CM | POA: Diagnosis not present

## 2020-01-10 DIAGNOSIS — C543 Malignant neoplasm of fundus uteri: Secondary | ICD-10-CM | POA: Diagnosis not present

## 2020-01-10 DIAGNOSIS — Z7189 Other specified counseling: Secondary | ICD-10-CM

## 2020-01-10 DIAGNOSIS — I2692 Saddle embolus of pulmonary artery without acute cor pulmonale: Secondary | ICD-10-CM

## 2020-01-10 DIAGNOSIS — E538 Deficiency of other specified B group vitamins: Secondary | ICD-10-CM | POA: Diagnosis not present

## 2020-01-10 DIAGNOSIS — Z5111 Encounter for antineoplastic chemotherapy: Secondary | ICD-10-CM | POA: Diagnosis not present

## 2020-01-10 DIAGNOSIS — R413 Other amnesia: Secondary | ICD-10-CM

## 2020-01-10 DIAGNOSIS — I700 Atherosclerosis of aorta: Secondary | ICD-10-CM | POA: Diagnosis not present

## 2020-01-10 LAB — CBC WITH DIFFERENTIAL (CANCER CENTER ONLY)
Abs Immature Granulocytes: 0.01 10*3/uL (ref 0.00–0.07)
Basophils Absolute: 0 10*3/uL (ref 0.0–0.1)
Basophils Relative: 0 %
Eosinophils Absolute: 0 10*3/uL (ref 0.0–0.5)
Eosinophils Relative: 0 %
HCT: 29.5 % — ABNORMAL LOW (ref 36.0–46.0)
Hemoglobin: 9.4 g/dL — ABNORMAL LOW (ref 12.0–15.0)
Immature Granulocytes: 0 %
Lymphocytes Relative: 32 %
Lymphs Abs: 1.1 10*3/uL (ref 0.7–4.0)
MCH: 33.8 pg (ref 26.0–34.0)
MCHC: 31.9 g/dL (ref 30.0–36.0)
MCV: 106.1 fL — ABNORMAL HIGH (ref 80.0–100.0)
Monocytes Absolute: 0.4 10*3/uL (ref 0.1–1.0)
Monocytes Relative: 11 %
Neutro Abs: 1.9 10*3/uL (ref 1.7–7.7)
Neutrophils Relative %: 57 %
Platelet Count: 115 10*3/uL — ABNORMAL LOW (ref 150–400)
RBC: 2.78 MIL/uL — ABNORMAL LOW (ref 3.87–5.11)
RDW: 17.5 % — ABNORMAL HIGH (ref 11.5–15.5)
WBC Count: 3.3 10*3/uL — ABNORMAL LOW (ref 4.0–10.5)
nRBC: 0 % (ref 0.0–0.2)

## 2020-01-10 LAB — CMP (CANCER CENTER ONLY)
ALT: 10 U/L (ref 0–44)
AST: 10 U/L — ABNORMAL LOW (ref 15–41)
Albumin: 3.4 g/dL — ABNORMAL LOW (ref 3.5–5.0)
Alkaline Phosphatase: 81 U/L (ref 38–126)
Anion gap: 7 (ref 5–15)
BUN: 19 mg/dL (ref 8–23)
CO2: 28 mmol/L (ref 22–32)
Calcium: 9.1 mg/dL (ref 8.9–10.3)
Chloride: 106 mmol/L (ref 98–111)
Creatinine: 0.75 mg/dL (ref 0.44–1.00)
GFR, Est AFR Am: >60 mL/min (ref >60)
GFR, Estimated: >60 mL/min (ref >60)
Glucose, Bld: 181 mg/dL — ABNORMAL HIGH (ref 70–99)
Potassium: 4 mmol/L (ref 3.5–5.1)
Sodium: 141 mmol/L (ref 135–145)
Total Bilirubin: <0.2 mg/dL — ABNORMAL LOW (ref 0.3–1.2)
Total Protein: 6.8 g/dL (ref 6.5–8.1)

## 2020-01-10 MED ORDER — SODIUM CHLORIDE 0.9% FLUSH
10.0000 mL | Freq: Once | INTRAVENOUS | Status: AC
  Administered 2020-01-10: 10 mL
  Filled 2020-01-10: qty 10

## 2020-01-10 MED ORDER — SODIUM CHLORIDE 0.9% FLUSH
10.0000 mL | INTRAVENOUS | Status: DC | PRN
  Filled 2020-01-10: qty 10

## 2020-01-10 MED ORDER — PALONOSETRON HCL INJECTION 0.25 MG/5ML
INTRAVENOUS | Status: AC
  Filled 2020-01-10: qty 5

## 2020-01-10 MED ORDER — SODIUM CHLORIDE 0.9 % IV SOLN
400.0000 mg | Freq: Once | INTRAVENOUS | Status: AC
  Administered 2020-01-10: 12:00:00 400 mg via INTRAVENOUS
  Filled 2020-01-10: qty 40

## 2020-01-10 MED ORDER — SODIUM CHLORIDE 0.9% FLUSH
3.0000 mL | INTRAVENOUS | Status: DC | PRN
  Administered 2020-01-10: 13:00:00 3 mL
  Filled 2020-01-10: qty 10

## 2020-01-10 MED ORDER — CYANOCOBALAMIN 1000 MCG/ML IJ SOLN
INTRAMUSCULAR | Status: AC
  Filled 2020-01-10: qty 1

## 2020-01-10 MED ORDER — PALONOSETRON HCL INJECTION 0.25 MG/5ML
0.2500 mg | Freq: Once | INTRAVENOUS | Status: AC
  Administered 2020-01-10: 11:00:00 0.25 mg via INTRAVENOUS

## 2020-01-10 MED ORDER — SODIUM CHLORIDE 0.9 % IV SOLN
Freq: Once | INTRAVENOUS | Status: AC
  Filled 2020-01-10: qty 250

## 2020-01-10 MED ORDER — SODIUM CHLORIDE 0.9 % IV SOLN
Freq: Once | INTRAVENOUS | Status: AC
  Filled 2020-01-10: qty 5

## 2020-01-10 MED ORDER — HEPARIN SOD (PORK) LOCK FLUSH 100 UNIT/ML IV SOLN
500.0000 [IU] | Freq: Once | INTRAVENOUS | Status: DC | PRN
  Filled 2020-01-10: qty 5

## 2020-01-10 MED ORDER — CYANOCOBALAMIN 1000 MCG/ML IJ SOLN
1000.0000 ug | Freq: Once | INTRAMUSCULAR | Status: AC
  Administered 2020-01-10: 11:00:00 1000 ug via INTRAMUSCULAR

## 2020-01-10 MED ORDER — HEPARIN SOD (PORK) LOCK FLUSH 100 UNIT/ML IV SOLN
250.0000 [IU] | Freq: Once | INTRAVENOUS | Status: AC | PRN
  Administered 2020-01-10: 13:00:00 250 [IU]
  Filled 2020-01-10: qty 5

## 2020-01-10 MED FILL — ENOXAPARIN SODIUM 100 MG/ML: 100 | 30 days supply | Qty: 60 | Fill #2

## 2020-01-10 NOTE — Telephone Encounter (Signed)
Scheduled appt per 1/19 sch message - pt sister aware of appt date and time

## 2020-01-10 NOTE — Patient Instructions (Signed)
Cyanocobalamin, Vitamin B12 injection What is this medicine? CYANOCOBALAMIN (sye an oh koe BAL a min) is a man made form of vitamin B12. Vitamin B12 is used in the growth of healthy blood cells, nerve cells, and proteins in the body. It also helps with the metabolism of fats and carbohydrates. This medicine is used to treat people who can not absorb vitamin B12. This medicine may be used for other purposes; ask your health care provider or pharmacist if you have questions. COMMON BRAND NAME(S): B-12 Compliance Kit, B-12 Injection Kit, Cyomin, LA-12, Nutri-Twelve, Physicians EZ Use B-12, Primabalt What should I tell my health care provider before I take this medicine? They need to know if you have any of these conditions:  kidney disease  Leber's disease  megaloblastic anemia  an unusual or allergic reaction to cyanocobalamin, cobalt, other medicines, foods, dyes, or preservatives  pregnant or trying to get pregnant  breast-feeding How should I use this medicine? This medicine is injected into a muscle or deeply under the skin. It is usually given by a health care professional in a clinic or doctor's office. However, your doctor may teach you how to inject yourself. Follow all instructions. Talk to your pediatrician regarding the use of this medicine in children. Special care may be needed. Overdosage: If you think you have taken too much of this medicine contact a poison control center or emergency room at once. NOTE: This medicine is only for you. Do not share this medicine with others. What if I miss a dose? If you are given your dose at a clinic or doctor's office, call to reschedule your appointment. If you give your own injections and you miss a dose, take it as soon as you can. If it is almost time for your next dose, take only that dose. Do not take double or extra doses. What may interact with this medicine?  colchicine  heavy alcohol intake This list may not describe all  possible interactions. Give your health care provider a list of all the medicines, herbs, non-prescription drugs, or dietary supplements you use. Also tell them if you smoke, drink alcohol, or use illegal drugs. Some items may interact with your medicine. What should I watch for while using this medicine? Visit your doctor or health care professional regularly. You may need blood work done while you are taking this medicine. You may need to follow a special diet. Talk to your doctor. Limit your alcohol intake and avoid smoking to get the best benefit. What side effects may I notice from receiving this medicine? Side effects that you should report to your doctor or health care professional as soon as possible:  allergic reactions like skin rash, itching or hives, swelling of the face, lips, or tongue  blue tint to skin  chest tightness, pain  difficulty breathing, wheezing  dizziness  red, swollen painful area on the leg Side effects that usually do not require medical attention (report to your doctor or health care professional if they continue or are bothersome):  diarrhea  headache This list may not describe all possible side effects. Call your doctor for medical advice about side effects. You may report side effects to FDA at 1-800-FDA-1088. Where should I keep my medicine? Keep out of the reach of children. Store at room temperature between 15 and 30 degrees C (59 and 85 degrees F). Protect from light. Throw away any unused medicine after the expiration date. NOTE: This sheet is a summary. It may not cover  all possible information. If you have questions about this medicine, talk to your doctor, pharmacist, or health care provider.  2020 Elsevier/Gold Standard (2008-03-20 22:10:20)  Coronavirus (COVID-19) Are you at risk?  Are you at risk for the Coronavirus (COVID-19)?  To be considered HIGH RISK for Coronavirus (COVID-19), you have to meet the following criteria:  . Traveled  to [place], [place], [place], [place] or [place]; or in the [place] to [place], [place], [place], or [place]; and have fever, cough, and shortness of breath within the last 2 weeks of travel OR . Been in close contact with a person diagnosed with COVID-19 within the last 2 weeks and have fever, cough, and shortness of breath . IF YOU DO NOT MEET THESE CRITERIA, YOU ARE CONSIDERED LOW RISK FOR COVID-19.  What to do if you are HIGH RISK for COVID-19?  Marland Kitchen If you are having a medical emergency, call 911. . Seek medical care right away. Before you go to a doctor's office, urgent care or emergency department, call ahead and tell them about your recent travel, contact with someone diagnosed with COVID-19, and your symptoms. You should receive instructions from your physician's office regarding next steps of care.  . When you arrive at healthcare provider, tell the healthcare staff immediately you have returned from visiting [place], [place], [place], [place] or [place]; or traveled in the [place] to [place], [place], [place], or [place]; in the last two weeks or you have been in close contact with a person diagnosed with COVID-19 in the last 2 weeks.   . Tell the health care staff about your symptoms: fever, cough and shortness of breath. . After you have been seen by a medical provider, you will be either: o Tested for (COVID-19) and discharged home on quarantine except to seek medical care if symptoms worsen, and asked to  - Stay home and avoid contact with others until you get your results (4-5 days)  - Avoid travel on public transportation if possible (such as bus, train, or airplane) or o Sent to the Emergency Department by EMS for evaluation, COVID-19 testing, and possible admission depending on your condition and test results.  What to do if you are LOW RISK for COVID-19?  Reduce your risk of any infection by using the same precautions used for avoiding the common cold or  flu:  Marland Kitchen Wash your hands often with soap and warm water for at least 20 seconds.  If soap and water are not readily available, use an alcohol-based hand sanitizer with at least 60% alcohol.  . If coughing or sneezing, cover your mouth and nose by coughing or sneezing into the elbow areas of your shirt or coat, into a tissue or into your sleeve (not your hands). . Avoid shaking hands with others and consider head nods or verbal greetings only. . Avoid touching your eyes, nose, or mouth with unwashed hands.  . Avoid close contact with people who are sick. . Avoid places or events with large numbers of people in one location, like concerts or sporting events. . Carefully consider travel plans you have or are making. . If you are planning any travel outside or inside the [place], visit the CDC's Travelers' Health webpage for the latest health notices. . If you have some symptoms but not all symptoms, continue to monitor at home and seek medical attention if your symptoms worsen. . If you are having a medical emergency, call 911.   ADDITIONAL  [place] / e-Visit: [email or web address]         [facility]: [facility]: [phone]                   [facility]: [phone]

## 2020-01-11 ENCOUNTER — Encounter: Payer: Self-pay | Admitting: Hematology and Oncology

## 2020-01-11 DIAGNOSIS — Z961 Presence of intraocular lens: Secondary | ICD-10-CM | POA: Diagnosis not present

## 2020-01-11 NOTE — Assessment & Plan Note (Signed)
She has persistent pancytopenia despite recent dose adjustment She has concurrent vitamin B12 deficiency and mild intermittent vaginal bleeding Her pancytopenia is stable She will continue reduced dose treatment along with vitamin B12 injection I will continue to monitor her iron studies closely and will give her intravenous iron infusion in the future if needed So far, she does not need transfusion support

## 2020-01-11 NOTE — Progress Notes (Signed)
[place] OFFICE PROGRESS NOTE  Patient Care Team: [name], NP as PCP - General (Internal Medicine)  ASSESSMENT & PLAN:  Uterine cancer [facility]) She tolerated recent treatment well except for pancytopenia which I think is due to nutritional deficiency We will proceed with similar dose adjustment as before I recommend we proceed with a few more months of single agent carboplatinum and repeat imaging study, due around March 2021  Pulmonary embolism ([place]) Her recent CT imaging show partial resolution of PE She will continue Lovenox We will observe closely  Pancytopenia, acquired ([place]) She has persistent pancytopenia despite recent dose adjustment She has concurrent vitamin B12 deficiency and mild intermittent vaginal bleeding Her pancytopenia is stable She will continue reduced dose treatment along with vitamin B12 injection I will continue to monitor her iron studies closely and will give her intravenous iron infusion in the future if needed So far, she does not need transfusion support    Memory disorder The patient is not able to make informed medical decisions Her sister who is present is in agreement with the plan of care Her sister is her dedicated healthcare power of attorney and will accompany her to all doctor's visit because without her, it is not possible to move forward with treatment   No orders of the defined types were placed in this encounter.   All questions were answered. The patient knows to call the clinic with any problems, questions or concerns. The total time spent in the appointment was 30 minutes encounter with patients including review of chart and various tests results, discussions about plan of care and coordination of care plan   Heath Lark, MD 01/11/2020 9:27 AM  INTERVAL HISTORY: Please see below for problem oriented charting. She returns with her sister for chemotherapy and follow-up According to her sister, she continues  to have intermittent vaginal bleeding No recent infection, fever or chills No recent nausea or changes in bowel habits The patient denies pain The patient is verbally communicative but only answer short sentences No recent cough or worsening shortness of breath SUMMARY OF ONCOLOGIC HISTORY: Oncology History Overview Note  Uterine cancer  40% ER positive MSI stable    Breast cancer of lower-inner quadrant of right female breast ([place])  05/23/2016 Mammogram   Diagnostic mammogram and ultrasound showed suspicious architectural distortion within the right breast lower inner quadrant, measuring 1.3 cm, without sonographic corelate.   06/03/2016 Initial Biopsy   Right breast inferior lower quadrant core needle biopsy showed atypical ductal hyperplasia with calcifications   06/09/2016 Receptors her2   Breast biopsy showed ER 100% positive, PR 100% positive, HER-2 negative, Ki-67 40%   06/09/2016 Initial Diagnosis   Breast cancer of lower-inner quadrant of right female breast ([place])   06/09/2016 Initial Biopsy   Right breast inner quadrant core needle biopsy showed invasive ductal carcinoma and DCIS, grade 1-2   06/17/2016 Initial Biopsy   Right axillary lymph node core needle biopsy showed metastatic carcinoma   06/17/2016 Receptors her2   Axillary node biopsy showed ER 100% positive, PR 95% positive, HER-2 negative   06/19/2016 Imaging   Bilateral breast MRI showed locations in the lower inner right breast, largest 2.7X1.3X1.6cm, biopsy clips in 2 of this areas. There are abnormal right axillary lymph nodes showing second cortical's, no evidence of malignancy in the left breast.   07/29/2016 Surgery   Right lumpectomy and ALND   07/29/2016 Pathology Results   Right lumpectomy showed G3 IDC, DCIS, margins (-), LVI(-).  1 of 16 nodes was positive    07/29/2016 Miscellaneous   Mammaprint showed high risk disease, luminal type B   09/09/2016 - 11/12/2016 Adjuvant Chemotherapy   Docetaxel and  Cytoxan (TC) every 3 weeks   12/24/2016 - 02/03/2017 Radiation Therapy   Adjuvant breast radiation 12/24/16 - 02/03/17 : Right Breast treated to 50.4 Gy in 28 fractions. Right Axilla treated to 45 Gy in 25 fractions.   02/17/2017 -  Anti-estrogen oral therapy   Letrozole 2.5 mg daily    03/23/2017 Imaging   DG Bone Density 03/23/17 ASSESSMENT: The BMD measured at Femur Neck Right is 0.809 g/cm2 with a T-score of -1.6. This patient is considered osteopenic according to [facility]) criteria.   09/08/2017 Mammogram   Diagnostic Mammogram 09/08/17  IMPRESSION: No mammographic evidence of malignancy in either breast, status post right lumpectomy. RECOMMENDATION: Diagnostic mammogram is suggested in 1 year. (Code:[id number])   09/09/2018 Mammogram   09/10/2019 Mammogram IMPRESSION: No mammographic evidence of malignancy.   Uterine cancer ([place])  08/25/2019 Imaging   US pelvis Heterogeneous mass in the fundus of the uterus as well as a mass in the region of the cervix and vagina. These are felt to represent underlying neoplasm. Direct visualization is recommended as well as possible MRI of the pelvis for further delineation.   09/01/2019 Pathology Results   Endocervical biopsy - HIGH-GRADE ENDOMETRIOID ADENOCARCINOMA. SEE NOTE Immunohistochemical stains show that the tumor cells are diffusely positive for p16, p53 and vimentin. Tumor cells show focal staining for ER and are negative for monoclonal CEA. This immunophenotype is consistent with a gynecologic primary.  ER 40% positive   09/08/2019 Imaging   Ct scan of chest, abdomen and pelvis showed: 1. Acute bilateral pulmonary embolism with large saddle embolus. Dilated main pulmonary artery suggesting pulmonary hypertension. Patient will be brought immediately to the emergency department at [facility] from the lobby of the radiology department per instructions of Dr. Denman George. 2. Infiltrative poorly marginated  heterogeneously enhancing 7.0 x 4.3 x 5.0 cm midline pelvic mass centered in the region of the vaginal fornix with involvement of the uterine cervix and with intimate association with the anterior rectal wall, suspicious for malignancy of uncertain primary site. 3. Extensive lymphadenopathy throughout the mediastinum, retroperitoneum and bilateral pelvis, likely metastatic adenopathy. 4. Enlarged uterus with apparent 7.5 cm anterior uterine body solid mass, nonspecific, potentially large uterine fibroid. No adnexal masses. No ascites. 5. Cholelithiasis. Nonspecific gas in the fundal gallbladder. Consider short-term follow-up CT abdomen to exclude emphysematous cholecystitis. No biliary ductal dilatation. 6. Marked colonic diverticulosis. 7.  Aortic Atherosclerosis (ICD10-I70.0).   09/08/2019 - 09/12/2019 Hospital Admission   She was admitted to the hospital for management of severe PE.  Her anticoagulation therapy was discontinued and switched to Lovenox   09/14/2019 Cancer Staging   Staging form: Corpus Uteri - Carcinoma and Carcinosarcoma, AJCC 8th Edition - Clinical stage from 09/14/2019: FIGO Stage IVB (cT3, cN2, pM1) - Signed by Heath Lark, MD on 09/14/2019   09/21/2019 Procedure   Successful left arm power PICC line placement with ultrasound and fluoroscopic guidance. The catheter is ready for use.   09/26/2019 -  Chemotherapy   The patient had carboplatin for chemotherapy treatment.     11/28/2019 Imaging   Ct chest, abdomen and pelvis 1. Interval decrease in size of a mass centered about the cervix, vaginal fornix, and closely abutting the superior rectum, consistent with treatment response. 2. Interval decrease in size of numerous enlarged mediastinal, retroperitoneal,  iliac, and pelvic sidewall lymph nodes, findings consistent with treatment response of metastatic disease. 3. There is an enlarged left axillary lymph node measuring 2.6 x 2.0 cm (series 2, image 3), which appears to be new  when compared to prior examination although imaging of the left axilla at that time was limited by patient arm in the down position and adjacent arthroplasty. There are newly enlarged left superior mediastinal/supraclavicular lymph nodes measuring up to 1.6 x 1.1 cm (series 2, image 4). Findings are concerning for mixed treatment response. Attention on follow-up. If possible, recommend performing all future examinations with patient arm in the elevated position to ensure adequate evaluation of the axilla. 4. 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 No new embolus noted. 5. Unchanged postoperative and post treatment appearance of the right breast. 6. Stable appearance of air within the gallbladder fundus or gallbladder wall, closely adjacent to a loop of distal small bowel, suggesting chronic enteric fistula. 7. 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 8. Aortic Atherosclerosis (ICD10-I70.0).       REVIEW OF SYSTEMS:   Constitutional: Denies fevers, chills or abnormal weight loss Eyes: Denies blurriness of vision Ears, nose, mouth, throat, and face: Denies mucositis or sore throat Respiratory: Denies cough, dyspnea or wheezes Cardiovascular: Denies palpitation, chest discomfort or lower extremity swelling Gastrointestinal:  Denies nausea, heartburn or change in bowel habits Skin: Denies abnormal skin rashes Lymphatics: Denies new lymphadenopathy or easy bruising Neurological:Denies numbness, tingling or new weaknesses Behavioral/Psych: Mood is stable, no new changes  All other systems were reviewed with the patient and are negative.  I have reviewed the past medical history, past surgical history, social history and family history with  the patient and they are unchanged from previous note.  ALLERGIES:  is allergic to other.  MEDICATIONS:  Current Outpatient Medications  Medication Sig Dispense Refill  . amLODipine (NORVASC) 5 MG tablet Take 1 tablet (5 mg total) by mouth daily. 30 tablet 0  . cetirizine (ZYRTEC) 10 MG tablet Take 10 mg by mouth every evening.     . Cholecalciferol (VITAMIN D-3) 125 MCG (5000 UT) TABS Take 5,000 Units by mouth daily.    . diphenoxylate-atropine (LOMOTIL) 2.5-0.025 MG tablet TAKE 1 TABLET BY MOUTH FOUR TIMES DAILY AS NEEDED FOR DIARRHEA OR LOOSE STOOLS 30 tablet 0  . enoxaparin (LOVENOX) 100 MG/ML injection Inject 1 mL (100 mg total) into the skin every 12 (twelve) hours. 60 mL 11  . exemestane (AROMASIN) 25 MG tablet TAKE 1 TABLET BY MOUTH DAILY 30 tablet 6  . ferrous sulfate 325 (65 FE) MG tablet Take 325 mg by mouth daily with breakfast.    . lisinopril (ZESTRIL) 20 MG tablet Take 1 tablet (20 mg total) by mouth daily. 90 tablet 3  . loperamide (IMODIUM A-D) 2 MG tablet Take 2 mg by mouth 4 (four) times daily as needed.    . memantine (NAMENDA) 10 MG tablet TAKE ONE TABLET BY MOUTH TWICE DAILY (Patient taking differently: Take 10 mg by mouth 2 (two) times daily. ) 60 tablet 5  . nitrofurantoin (MACRODANTIN) 100 MG capsule Take 100 mg by mouth 2 (two) times daily.    . ondansetron (ZOFRAN) 8 MG tablet Take 1 tablet (8 mg total) by mouth every 8 (eight)  hours as needed. 30 tablet 1  . Probiotic Product (PROBIOTIC PO) Take 1 capsule by mouth daily.     . prochlorperazine (COMPAZINE) 10 MG tablet Take 1 tablet (10 mg total) by mouth every 6 (six) hours as needed (Nausea or vomiting). 30 tablet 1  . rosuvastatin (CRESTOR) 10 MG tablet TAKE ONE TABLET EVERY DAY 30 tablet 2  . sertraline (ZOLOFT) 100 MG tablet Take 1 tablet (100 mg total) by mouth daily. 90 tablet 3  . Sodium Chloride Flush (NORMAL SALINE FLUSH) 0.9 % SOLN Inject 10 ml daily to each lumen, double lumen PICC so please dispense 60  prefilled syringes 600 mL 0   No current facility-administered medications for this visit.    PHYSICAL EXAMINATION: ECOG PERFORMANCE STATUS: 2 - Symptomatic, <50% confined to bed  Vitals:   01/10/20 1034  BP: 127/65  Pulse: 63  Resp: 18  Temp: 98.2 F (36.8 C)  SpO2: 99%   Filed Weights   01/10/20 1034  Weight: 227 lb 9.6 oz (103.2 kg)    GENERAL:alert, no distress and comfortable.  Limited exam due to her body habitus and the patient sitting on the wheelchair  SKIN: skin color, texture, turgor are normal, no rashes or significant lesions EYES: normal, Conjunctiva are pink and non-injected, sclera clear OROPHARYNX:no exudate, no erythema and lips, buccal mucosa, and tongue normal  NECK: supple, thyroid normal size, non-tender, without nodularity LYMPH:  no palpable lymphadenopathy in the cervical, axillary or inguinal LUNGS: clear to auscultation and percussion with normal breathing effort HEART: regular rate & rhythm and no murmurs with moderate bilateral lower extremity edema ABDOMEN:abdomen soft, non-tender and normal bowel sounds Musculoskeletal:no cyanosis of digits and no clubbing  NEURO: alert & oriented x 3 with fluent speech, no focal motor/sensory deficits  LABORATORY DATA:  I have reviewed the data as listed    Component Value Date/Time   NA 141 01/10/2020 0923   NA 140 09/22/2017 1003   K 4.0 01/10/2020 0923   K 3.8 09/22/2017 1003   CL 106 01/10/2020 0923   CO2 28 01/10/2020 0923   CO2 29 09/22/2017 1003   GLUCOSE 181 (H) 01/10/2020 0923   GLUCOSE 116 09/22/2017 1003   BUN 19 01/10/2020 0923   BUN 22.4 09/22/2017 1003   CREATININE 0.75 01/10/2020 0923   CREATININE 0.71 07/02/2018 1514   CREATININE 0.8 09/22/2017 1003   CALCIUM 9.1 01/10/2020 0923   CALCIUM 10.0 09/22/2017 1003   PROT 6.8 01/10/2020 0923   PROT 7.1 09/22/2017 1003   ALBUMIN 3.4 (L) 01/10/2020 0923   ALBUMIN 3.1 (L) 09/22/2017 1003   AST 10 (L) 01/10/2020 0923   AST 14 09/22/2017  1003   ALT 10 01/10/2020 0923   ALT 9 09/22/2017 1003   ALKPHOS 81 01/10/2020 0923   ALKPHOS 126 09/22/2017 1003   BILITOT <0.2 (L) 01/10/2020 0923   BILITOT 0.24 09/22/2017 1003   GFRNONAA >60 01/10/2020 0923   GFRAA >60 01/10/2020 0923    No results found for: SPEP, UPEP  Lab Results  Component Value Date   WBC 3.3 (L) 01/10/2020   NEUTROABS 1.9 01/10/2020   HGB 9.4 (L) 01/10/2020   HCT 29.5 (L) 01/10/2020   MCV 106.1 (H) 01/10/2020   PLT 115 (L) 01/10/2020      Chemistry      Component Value Date/Time   NA 141 01/10/2020 0923   NA 140 09/22/2017 1003   K 4.0 01/10/2020 0923   K 3.8 09/22/2017 1003   CL  106 01/10/2020 0923   CO2 28 01/10/2020 0923   CO2 29 09/22/2017 1003   BUN 19 01/10/2020 0923   BUN 22.4 09/22/2017 1003   CREATININE 0.75 01/10/2020 0923   CREATININE 0.71 07/02/2018 1514   CREATININE 0.8 09/22/2017 1003      Component Value Date/Time   CALCIUM 9.1 01/10/2020 0923   CALCIUM 10.0 09/22/2017 1003   ALKPHOS 81 01/10/2020 0923   ALKPHOS 126 09/22/2017 1003   AST 10 (L) 01/10/2020 0923   AST 14 09/22/2017 1003   ALT 10 01/10/2020 0923   ALT 9 09/22/2017 1003   BILITOT <0.2 (L) 01/10/2020 0923   BILITOT 0.24 09/22/2017 1003

## 2020-01-11 NOTE — Assessment & Plan Note (Signed)
The patient is not able to make informed medical decisions Her sister who is present is in agreement with the plan of care Her sister is her dedicated healthcare power of attorney and will accompany her to all doctor's visit because without her, it is not possible to move forward with treatment

## 2020-01-11 NOTE — Assessment & Plan Note (Signed)
She tolerated recent treatment well except for pancytopenia which I think is due to nutritional deficiency We will proceed with similar dose adjustment as before I recommend we proceed with a few more months of single agent carboplatinum and repeat imaging study, due around March 2021

## 2020-01-11 NOTE — Assessment & Plan Note (Signed)
Her recent CT imaging show partial resolution of PE She will continue Lovenox We will observe closely

## 2020-01-17 ENCOUNTER — Other Ambulatory Visit: Payer: Self-pay

## 2020-01-17 ENCOUNTER — Inpatient Hospital Stay: Payer: Medicare PPO

## 2020-01-17 VITALS — BP 122/58 | HR 66 | Temp 98.1°F | Resp 18

## 2020-01-17 DIAGNOSIS — C543 Malignant neoplasm of fundus uteri: Secondary | ICD-10-CM | POA: Diagnosis not present

## 2020-01-17 DIAGNOSIS — D61818 Other pancytopenia: Secondary | ICD-10-CM | POA: Diagnosis not present

## 2020-01-17 DIAGNOSIS — Z170 Estrogen receptor positive status [ER+]: Secondary | ICD-10-CM | POA: Diagnosis not present

## 2020-01-17 DIAGNOSIS — C50311 Malignant neoplasm of lower-inner quadrant of right female breast: Secondary | ICD-10-CM | POA: Diagnosis not present

## 2020-01-17 DIAGNOSIS — I2699 Other pulmonary embolism without acute cor pulmonale: Secondary | ICD-10-CM | POA: Diagnosis not present

## 2020-01-17 DIAGNOSIS — E538 Deficiency of other specified B group vitamins: Secondary | ICD-10-CM | POA: Diagnosis not present

## 2020-01-17 DIAGNOSIS — R413 Other amnesia: Secondary | ICD-10-CM | POA: Diagnosis not present

## 2020-01-17 DIAGNOSIS — C55 Malignant neoplasm of uterus, part unspecified: Secondary | ICD-10-CM

## 2020-01-17 DIAGNOSIS — I700 Atherosclerosis of aorta: Secondary | ICD-10-CM | POA: Diagnosis not present

## 2020-01-17 DIAGNOSIS — Z7189 Other specified counseling: Secondary | ICD-10-CM

## 2020-01-17 DIAGNOSIS — Z95828 Presence of other vascular implants and grafts: Secondary | ICD-10-CM

## 2020-01-17 DIAGNOSIS — Z5111 Encounter for antineoplastic chemotherapy: Secondary | ICD-10-CM | POA: Diagnosis not present

## 2020-01-17 MED ORDER — HEPARIN SOD (PORK) LOCK FLUSH 100 UNIT/ML IV SOLN
500.0000 [IU] | Freq: Once | INTRAVENOUS | Status: AC
  Administered 2020-01-17: 250 [IU]
  Filled 2020-01-17: qty 5

## 2020-01-17 MED ORDER — CYANOCOBALAMIN 1000 MCG/ML IJ SOLN
1000.0000 ug | Freq: Once | INTRAMUSCULAR | Status: AC
  Administered 2020-01-17: 1000 ug via INTRAMUSCULAR

## 2020-01-17 MED ORDER — SODIUM CHLORIDE 0.9% FLUSH
10.0000 mL | Freq: Once | INTRAVENOUS | Status: AC
  Administered 2020-01-17: 10 mL
  Filled 2020-01-17: qty 10

## 2020-01-17 MED ORDER — CYANOCOBALAMIN 1000 MCG/ML IJ SOLN
INTRAMUSCULAR | Status: AC
  Filled 2020-01-17: qty 1

## 2020-01-20 ENCOUNTER — Other Ambulatory Visit: Payer: Medicare PPO

## 2020-01-20 ENCOUNTER — Ambulatory Visit: Payer: Medicare PPO | Admitting: Hematology

## 2020-01-21 DIAGNOSIS — S42202A Unspecified fracture of upper end of left humerus, initial encounter for closed fracture: Secondary | ICD-10-CM | POA: Diagnosis not present

## 2020-01-21 DIAGNOSIS — Z4789 Encounter for other orthopedic aftercare: Secondary | ICD-10-CM | POA: Diagnosis not present

## 2020-01-24 ENCOUNTER — Other Ambulatory Visit: Payer: Self-pay

## 2020-01-24 ENCOUNTER — Inpatient Hospital Stay: Payer: Medicare PPO | Attending: Hematology

## 2020-01-24 ENCOUNTER — Inpatient Hospital Stay: Payer: Medicare PPO

## 2020-01-24 DIAGNOSIS — C50311 Malignant neoplasm of lower-inner quadrant of right female breast: Secondary | ICD-10-CM | POA: Diagnosis not present

## 2020-01-24 DIAGNOSIS — I10 Essential (primary) hypertension: Secondary | ICD-10-CM | POA: Diagnosis not present

## 2020-01-24 DIAGNOSIS — N939 Abnormal uterine and vaginal bleeding, unspecified: Secondary | ICD-10-CM | POA: Diagnosis not present

## 2020-01-24 DIAGNOSIS — Z86711 Personal history of pulmonary embolism: Secondary | ICD-10-CM | POA: Diagnosis not present

## 2020-01-24 DIAGNOSIS — R5383 Other fatigue: Secondary | ICD-10-CM | POA: Diagnosis not present

## 2020-01-24 DIAGNOSIS — C55 Malignant neoplasm of uterus, part unspecified: Secondary | ICD-10-CM | POA: Insufficient documentation

## 2020-01-24 DIAGNOSIS — Z170 Estrogen receptor positive status [ER+]: Secondary | ICD-10-CM | POA: Insufficient documentation

## 2020-01-24 DIAGNOSIS — F0390 Unspecified dementia without behavioral disturbance: Secondary | ICD-10-CM | POA: Insufficient documentation

## 2020-01-24 DIAGNOSIS — I700 Atherosclerosis of aorta: Secondary | ICD-10-CM | POA: Diagnosis not present

## 2020-01-24 DIAGNOSIS — Z7901 Long term (current) use of anticoagulants: Secondary | ICD-10-CM | POA: Insufficient documentation

## 2020-01-24 DIAGNOSIS — Z5111 Encounter for antineoplastic chemotherapy: Secondary | ICD-10-CM | POA: Diagnosis present

## 2020-01-24 DIAGNOSIS — D61810 Antineoplastic chemotherapy induced pancytopenia: Secondary | ICD-10-CM | POA: Insufficient documentation

## 2020-01-24 DIAGNOSIS — K8020 Calculus of gallbladder without cholecystitis without obstruction: Secondary | ICD-10-CM | POA: Insufficient documentation

## 2020-01-24 DIAGNOSIS — Z452 Encounter for adjustment and management of vascular access device: Secondary | ICD-10-CM | POA: Insufficient documentation

## 2020-01-24 DIAGNOSIS — R160 Hepatomegaly, not elsewhere classified: Secondary | ICD-10-CM | POA: Diagnosis not present

## 2020-01-24 DIAGNOSIS — Z79899 Other long term (current) drug therapy: Secondary | ICD-10-CM | POA: Insufficient documentation

## 2020-01-24 DIAGNOSIS — Z923 Personal history of irradiation: Secondary | ICD-10-CM | POA: Insufficient documentation

## 2020-01-24 DIAGNOSIS — Z95828 Presence of other vascular implants and grafts: Secondary | ICD-10-CM

## 2020-01-24 DIAGNOSIS — Z7189 Other specified counseling: Secondary | ICD-10-CM

## 2020-01-24 DIAGNOSIS — E538 Deficiency of other specified B group vitamins: Secondary | ICD-10-CM | POA: Insufficient documentation

## 2020-01-24 MED ORDER — HEPARIN SOD (PORK) LOCK FLUSH 100 UNIT/ML IV SOLN
500.0000 [IU] | Freq: Once | INTRAVENOUS | Status: AC
  Administered 2020-01-24: 250 [IU]
  Filled 2020-01-24: qty 5

## 2020-01-24 MED ORDER — SODIUM CHLORIDE 0.9% FLUSH
10.0000 mL | Freq: Once | INTRAVENOUS | Status: AC
  Administered 2020-01-24: 10 mL
  Filled 2020-01-24: qty 10

## 2020-01-24 NOTE — Progress Notes (Signed)
Per Dr Alvy Bimler patient only due for flush today. No injection needed

## 2020-01-24 NOTE — Progress Notes (Signed)
Patient stated that sensitive dressing keeps pealing off. Patient stated that she was not allergic to the regular take home dressing. Applied skin prep and regular dressing on patients picc during dressing change.

## 2020-01-30 ENCOUNTER — Other Ambulatory Visit: Payer: Self-pay

## 2020-01-30 ENCOUNTER — Inpatient Hospital Stay (HOSPITAL_BASED_OUTPATIENT_CLINIC_OR_DEPARTMENT_OTHER): Payer: Medicare PPO | Admitting: Hematology and Oncology

## 2020-01-30 ENCOUNTER — Other Ambulatory Visit: Payer: Self-pay | Admitting: Hematology and Oncology

## 2020-01-30 ENCOUNTER — Inpatient Hospital Stay: Payer: Medicare PPO

## 2020-01-30 VITALS — BP 144/61 | HR 76 | Temp 98.3°F | Resp 19 | Ht 61.0 in | Wt 227.0 lb

## 2020-01-30 VITALS — BP 118/59 | HR 69 | Temp 97.7°F | Resp 17

## 2020-01-30 DIAGNOSIS — D61818 Other pancytopenia: Secondary | ICD-10-CM | POA: Diagnosis not present

## 2020-01-30 DIAGNOSIS — C55 Malignant neoplasm of uterus, part unspecified: Secondary | ICD-10-CM

## 2020-01-30 DIAGNOSIS — Z7189 Other specified counseling: Secondary | ICD-10-CM

## 2020-01-30 DIAGNOSIS — Z5111 Encounter for antineoplastic chemotherapy: Secondary | ICD-10-CM | POA: Diagnosis not present

## 2020-01-30 DIAGNOSIS — Z95828 Presence of other vascular implants and grafts: Secondary | ICD-10-CM

## 2020-01-30 DIAGNOSIS — N939 Abnormal uterine and vaginal bleeding, unspecified: Secondary | ICD-10-CM | POA: Diagnosis not present

## 2020-01-30 DIAGNOSIS — C50311 Malignant neoplasm of lower-inner quadrant of right female breast: Secondary | ICD-10-CM

## 2020-01-30 DIAGNOSIS — I2692 Saddle embolus of pulmonary artery without acute cor pulmonale: Secondary | ICD-10-CM

## 2020-01-30 DIAGNOSIS — Z170 Estrogen receptor positive status [ER+]: Secondary | ICD-10-CM

## 2020-01-30 DIAGNOSIS — D539 Nutritional anemia, unspecified: Secondary | ICD-10-CM | POA: Diagnosis not present

## 2020-01-30 LAB — CBC WITH DIFFERENTIAL (CANCER CENTER ONLY)
Abs Immature Granulocytes: 0.01 10*3/uL (ref 0.00–0.07)
Basophils Absolute: 0 10*3/uL (ref 0.0–0.1)
Basophils Relative: 0 %
Eosinophils Absolute: 0 10*3/uL (ref 0.0–0.5)
Eosinophils Relative: 1 %
HCT: 24.9 % — ABNORMAL LOW (ref 36.0–46.0)
Hemoglobin: 7.9 g/dL — ABNORMAL LOW (ref 12.0–15.0)
Immature Granulocytes: 0 %
Lymphocytes Relative: 42 %
Lymphs Abs: 1.2 10*3/uL (ref 0.7–4.0)
MCH: 35.3 pg — ABNORMAL HIGH (ref 26.0–34.0)
MCHC: 31.7 g/dL (ref 30.0–36.0)
MCV: 111.2 fL — ABNORMAL HIGH (ref 80.0–100.0)
Monocytes Absolute: 0.3 10*3/uL (ref 0.1–1.0)
Monocytes Relative: 11 %
Neutro Abs: 1.3 10*3/uL — ABNORMAL LOW (ref 1.7–7.7)
Neutrophils Relative %: 46 %
Platelet Count: 75 10*3/uL — ABNORMAL LOW (ref 150–400)
RBC: 2.24 MIL/uL — ABNORMAL LOW (ref 3.87–5.11)
RDW: 16.1 % — ABNORMAL HIGH (ref 11.5–15.5)
WBC Count: 2.9 10*3/uL — ABNORMAL LOW (ref 4.0–10.5)
nRBC: 0 % (ref 0.0–0.2)

## 2020-01-30 LAB — CMP (CANCER CENTER ONLY)
ALT: 13 U/L (ref 0–44)
AST: 11 U/L — ABNORMAL LOW (ref 15–41)
Albumin: 3.4 g/dL — ABNORMAL LOW (ref 3.5–5.0)
Alkaline Phosphatase: 85 U/L (ref 38–126)
Anion gap: 6 (ref 5–15)
BUN: 20 mg/dL (ref 8–23)
CO2: 28 mmol/L (ref 22–32)
Calcium: 9.3 mg/dL (ref 8.9–10.3)
Chloride: 107 mmol/L (ref 98–111)
Creatinine: 0.72 mg/dL (ref 0.44–1.00)
GFR, Est AFR Am: >60 mL/min (ref >60)
GFR, Estimated: >60 mL/min (ref >60)
Glucose, Bld: 111 mg/dL — ABNORMAL HIGH (ref 70–99)
Potassium: 4.6 mmol/L (ref 3.5–5.1)
Sodium: 141 mmol/L (ref 135–145)
Total Bilirubin: 0.3 mg/dL (ref 0.3–1.2)
Total Protein: 6.9 g/dL (ref 6.5–8.1)

## 2020-01-30 LAB — ABO/RH: ABO/RH(D): A POS

## 2020-01-30 LAB — PREPARE RBC (CROSSMATCH)

## 2020-01-30 MED ORDER — HEPARIN SOD (PORK) LOCK FLUSH 100 UNIT/ML IV SOLN
250.0000 [IU] | Freq: Once | INTRAVENOUS | Status: DC
  Filled 2020-01-30: qty 5

## 2020-01-30 MED ORDER — ACETAMINOPHEN 325 MG PO TABS
650.0000 mg | ORAL_TABLET | Freq: Once | ORAL | Status: AC
  Administered 2020-01-30: 13:00:00 650 mg via ORAL

## 2020-01-30 MED ORDER — ACETAMINOPHEN 325 MG PO TABS
ORAL_TABLET | ORAL | Status: AC
  Filled 2020-01-30: qty 2

## 2020-01-30 MED ORDER — DIPHENHYDRAMINE HCL 25 MG PO CAPS
25.0000 mg | ORAL_CAPSULE | Freq: Once | ORAL | Status: AC
  Administered 2020-01-30: 25 mg via ORAL

## 2020-01-30 MED ORDER — SODIUM CHLORIDE 0.9% FLUSH
10.0000 mL | Freq: Once | INTRAVENOUS | Status: AC
  Administered 2020-01-30: 10 mL
  Filled 2020-01-30: qty 10

## 2020-01-30 MED ORDER — HEPARIN SOD (PORK) LOCK FLUSH 100 UNIT/ML IV SOLN
250.0000 [IU] | INTRAVENOUS | Status: AC | PRN
  Administered 2020-01-30: 16:00:00 250 [IU]
  Filled 2020-01-30: qty 5

## 2020-01-30 MED ORDER — DIPHENHYDRAMINE HCL 25 MG PO CAPS
ORAL_CAPSULE | ORAL | Status: AC
  Filled 2020-01-30: qty 1

## 2020-01-30 MED ORDER — SODIUM CHLORIDE 0.9% IV SOLUTION
250.0000 mL | Freq: Once | INTRAVENOUS | Status: AC
  Administered 2020-01-30: 13:00:00 250 mL via INTRAVENOUS
  Filled 2020-01-30: qty 250

## 2020-01-30 MED ORDER — SODIUM CHLORIDE 0.9% FLUSH
10.0000 mL | Freq: Once | INTRAVENOUS | Status: DC
  Filled 2020-01-30: qty 10

## 2020-01-30 MED ORDER — SODIUM CHLORIDE 0.9% FLUSH
3.0000 mL | INTRAVENOUS | Status: AC | PRN
  Administered 2020-01-30: 16:00:00 3 mL
  Filled 2020-01-30: qty 10

## 2020-01-30 NOTE — Patient Instructions (Signed)
Blood Transfusion, Adult, Care After This sheet gives you information about how to care for yourself after your procedure. Your doctor may also give you more specific instructions. If you have problems or questions, contact your doctor. What can I expect after the procedure? After the procedure, it is common to have:  Bruising and soreness at the IV site.  A fever or chills on the day of the procedure. This may be your body's response to the new blood cells received.  A headache. Follow these instructions at home: Insertion site care      Follow instructions from your doctor about how to take care of your insertion site. This is where an IV tube was put into your vein. Make sure you: ? Wash your hands with soap and water before and after you change your bandage (dressing). If you cannot use soap and water, use hand sanitizer. ? Change your bandage as told by your doctor.  Check your insertion site every day for signs of infection. Check for: ? Redness, swelling, or pain. ? Bleeding from the site. ? Warmth. ? Pus or a bad smell. General instructions  Take over-the-counter and prescription medicines only as told by your doctor.  Rest as told by your doctor.  Go back to your normal activities as told by your doctor.  Keep all follow-up visits as told by your doctor. This is important. Contact a doctor if:  You have itching or red, swollen areas of skin (hives).  You feel worried or nervous (anxious).  You feel weak after doing your normal activities.  You have redness, swelling, warmth, or pain around the insertion site.  You have blood coming from the insertion site, and the blood does not stop with pressure.  You have pus or a bad smell coming from the insertion site. Get help right away if:  You have signs of a serious reaction. This may be coming from an allergy or the body's defense system (immune system). Signs include: ? Trouble breathing or shortness of  breath. ? Swelling of the face or feeling warm (flushed). ? Fever or chills. ? Head, chest, or back pain. ? Dark pee (urine) or blood in the pee. ? Widespread rash. ? Fast heartbeat. ? Feeling dizzy or light-headed. You may receive your blood transfusion in an outpatient setting. If so, you will be told whom to contact to report any reactions. These symptoms may be an emergency. Do not wait to see if the symptoms will go away. Get medical help right away. Call your local emergency services (911 in the [place] Do not drive yourself to the hospital. Summary  Bruising and soreness at the IV site are common.  Check your insertion site every day for signs of infection.  Rest as told by your doctor. Go back to your normal activities as told by your doctor.  Get help right away if you have signs of a serious reaction. This information is not intended to replace advice given to you by your health care provider. Make sure you discuss any questions you have with your health care provider. Document Revised: 06/02/2019 Document Reviewed: 06/02/2019 Elsevier Patient Education  2020 Elsevier Inc.  Coronavirus (COVID-19) Are you at risk?  Are you at risk for the Coronavirus (COVID-19)?  To be considered HIGH RISK for Coronavirus (COVID-19), you have to meet the following criteria:  . Traveled to [place], [place], [place], [place] or [place]; or in the [place] to [place], [place], [place], or [place]   [place]; and have fever, cough, and shortness of breath within the last 2 weeks of travel OR . Been in close contact with a person diagnosed with COVID-19 within the last 2 weeks and have fever, cough, and shortness of breath . IF YOU DO NOT MEET THESE CRITERIA, YOU ARE CONSIDERED LOW RISK FOR COVID-19.  What to do if you are HIGH RISK for COVID-19?  . If you are having a medical emergency, call 911. . Seek medical care right away. Before you go to a doctor's office, urgent care or emergency  department, call ahead and tell them about your recent travel, contact with someone diagnosed with COVID-19, and your symptoms. You should receive instructions from your physician's office regarding next steps of care.  . When you arrive at healthcare provider, tell the healthcare staff immediately you have returned from visiting [place], [place], [place], [place] or [place]; or traveled in the [place] to [place], [place], [place], or [place]; in the last two weeks or you have been in close contact with a person diagnosed with COVID-19 in the last 2 weeks.   . Tell the health care staff about your symptoms: fever, cough and shortness of breath. . After you have been seen by a medical provider, you will be either: o Tested for (COVID-19) and discharged home on quarantine except to seek medical care if symptoms worsen, and asked to  - Stay home and avoid contact with others until you get your results (4-5 days)  - Avoid travel on public transportation if possible (such as bus, train, or airplane) or o Sent to the Emergency Department by EMS for evaluation, COVID-19 testing, and possible admission depending on your condition and test results.  What to do if you are LOW RISK for COVID-19?  Reduce your risk of any infection by using the same precautions used for avoiding the common cold or flu:  . Wash your hands often with soap and warm water for at least 20 seconds.  If soap and water are not readily available, use an alcohol-based hand sanitizer with at least 60% alcohol.  . If coughing or sneezing, cover your mouth and nose by coughing or sneezing into the elbow areas of your shirt or coat, into a tissue or into your sleeve (not your hands). . Avoid shaking hands with others and consider head nods or verbal greetings only. . Avoid touching your eyes, nose, or mouth with unwashed hands.  . Avoid close contact with people who are sick. . Avoid places or events with large numbers of people  in one location, like concerts or sporting events. . Carefully consider travel plans you have or are making. . If you are planning any travel outside or inside the [place], visit the CDC's Travelers' Health webpage for the latest health notices. . If you have some symptoms but not all symptoms, continue to monitor at home and seek medical attention if your symptoms worsen. . If you are having a medical emergency, call 911.   ADDITIONAL HEALTHCARE OPTIONS FOR PATIENTS  [facility] / e-Visit: [email or web address]         [facility]: [phone]  [facility]: [phone]                   [facility]: [phone]   

## 2020-01-31 ENCOUNTER — Ambulatory Visit: Payer: Medicare PPO | Admitting: Neurology

## 2020-01-31 ENCOUNTER — Encounter: Payer: Self-pay | Admitting: Hematology and Oncology

## 2020-01-31 LAB — TYPE AND SCREEN
ABO/RH(D): A POS
Antibody Screen: NEGATIVE
Unit division: 0

## 2020-01-31 LAB — BPAM RBC
Blood Product Expiration Date: 202102162359
ISSUE DATE / TIME: 202102081404
Unit Type and Rh: 600

## 2020-01-31 NOTE — Assessment & Plan Note (Signed)
The patient is frail with multiple comorbidities Despite upfront dose adjustment, she is developing significant pancytopenia from treatment I suspect a component of her anemia and thrombocytopenia is due to ongoing vaginal bleeding We will delay chemotherapy by 1 week to allow bone marrow recovery Assuming that her blood count improve next week, we will resume chemotherapy next week and then I plan to order CT imaging in the month I plan to delay future treatment by 1 week given her significant pancytopenia seen, rather than further dose adjustment

## 2020-01-31 NOTE — Assessment & Plan Note (Signed)
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

## 2020-01-31 NOTE — Assessment & Plan Note (Signed)
We discussed the role of palliative radiation therapy for vaginal bleeding Her sister understood that while she is undergoing radiation treatment, we cannot proceed with chemotherapy We balance out the risk and benefits of anticoagulation therapy For now, we will continue to monitor her for signs of bleeding and recommend continue transfusion support

## 2020-01-31 NOTE — Progress Notes (Signed)
[place] OFFICE PROGRESS NOTE  Patient Care Team: [name], NP as PCP - General (Internal Medicine)  ASSESSMENT & PLAN:  Uterine cancer [facility]) The patient is frail with multiple comorbidities Despite upfront dose adjustment, she is developing significant pancytopenia from treatment I suspect a component of her anemia and thrombocytopenia is due to ongoing vaginal bleeding We will delay chemotherapy by 1 week to allow bone marrow recovery Assuming that her blood count improve next week, we will resume chemotherapy next week and then I plan to order CT imaging in the month I plan to delay future treatment by 1 week given her significant pancytopenia seen, rather than further dose adjustment  Pulmonary embolism ([place]) The patient has failed anticoagulation therapy in the past but is doing well on Lovenox She has ongoing vaginal bleeding We discussed the risk and benefits of discontinuation of anticoagulation therapy versus continuing treatment and risking severe bleeding and transfusion Due to her dementia, ultimately, her sister made the decision for her to continue on treatment  Pancytopenia, acquired [facility]) She has multifactorial anemia, component of vitamin B12 deficiency of which she has received replacement therapy, ongoing vaginal bleeding and bone marrow suppressive side effects from recent chemotherapy  We discussed some of the risks, benefits, and alternatives of blood transfusions. The patient is symptomatic from anemia and the hemoglobin level is critically low.  Some of the side-effects to be expected including risks of transfusion reactions, chills, infection, syndrome of volume overload and risk of hospitalization from various reasons and the patient is willing to proceed and went ahead to sign consent today. We will proceed with 1 unit of blood We will delay chemotherapy by 1 week She does not need platelet transfusion support right now She does not need  G-CSF support We discussed neutropenic precaution  Vaginal bleeding, abnormal We discussed the role of palliative radiation therapy for vaginal bleeding Her sister understood that while she is undergoing radiation treatment, we cannot proceed with chemotherapy We balance out the risk and benefits of anticoagulation therapy For now, we will continue to monitor her for signs of bleeding and recommend continue transfusion support  Goals of care, counseling/discussion I have ongoing goals of care discussion with her sister The patient has a very complicated oncologic history, with significant comorbidities 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 CT CHEST W CONTRAST    Standing Status:   Future    Standing Expiration Date:   01/29/2021    Order Specific Question:   If indicated for the ordered procedure, I authorize the administration of contrast media per Radiology protocol    Answer:   Yes    Order Specific Question:   Preferred imaging location?    Answer:   [facility]    Order Specific Question:   Radiology Contrast Protocol - do NOT remove file path    Answer:   \\[email or web address]  . CT ABDOMEN PELVIS W CONTRAST    Standing Status:   Future    Standing Expiration Date:  01/29/2021    Order Specific Question:   If indicated for the ordered procedure, I authorize the administration of contrast media per Radiology protocol    Answer:   Yes    Order Specific Question:    Preferred imaging location?    Answer:   [facility]    Order Specific Question:   Radiology Contrast Protocol - do NOT remove file path    Answer:   \\[email or web address]    Order Specific Question:   ** REASON FOR EXAM (FREE TEXT)    Answer:   no oral contrast pls  . Practitioner attestation of consent    I, the ordering practitioner, attest that I have discussed with the patient the benefits, risks, side effects, alternatives, likelihood of achieving goals and potential problems during recovery for the procedure listed.    Standing Status:   Future    Standing Expiration Date:   01/29/2021    Order Specific Question:   Procedure    Answer:   Blood Product(s)  . Complete patient signature process for consent form    Standing Status:   Future    Standing Expiration Date:   01/29/2021  . Care order/instruction    Transfuse Parameters    Standing Status:   Future    Standing Expiration Date:   01/29/2021  . Type and screen    Standing Status:   Future    Number of Occurrences:   1    Standing Expiration Date:   01/29/2021  . Prepare RBC    Standing Status:   Standing    Number of Occurrences:   1    Order Specific Question:   # of Units    Answer:   1 unit    Order Specific Question:   Transfusion Indications    Answer:   Symptomatic Anemia    Order Specific Question:   Special Requirements    Answer:   Irradiated    Order Specific Question:   If emergent release call blood bank    Answer:   Not emergent release    All questions were answered. The patient knows to call the clinic with any problems, questions or concerns. The total time spent in the appointment was 40 minutes encounter with patients including review of chart and various tests results, discussions about plan of care and coordination of care plan   Heath Lark, MD 01/31/2020 7:48 AM  INTERVAL HISTORY: Please see below for problem oriented charting. 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 There are abnormal right axillary lymph nodes showing second cortical's, no evidence of malignancy in the left breast.   07/29/2016 Surgery   Right lumpectomy and ALND   07/29/2016 Pathology Results   Right lumpectomy showed G3 IDC, DCIS, margins (-), LVI(-).  1 of 16 nodes was positive    07/29/2016 Miscellaneous   Mammaprint showed high risk disease, luminal type B   09/09/2016 -  11/12/2016 Adjuvant Chemotherapy   Docetaxel and Cytoxan (TC) every 3 weeks   12/24/2016 - 02/03/2017 Radiation Therapy   Adjuvant breast radiation 12/24/16 - 02/03/17 : Right Breast treated to 50.4 Gy in 28 fractions. Right Axilla treated to 45 Gy in 25 fractions.   02/17/2017 -  Anti-estrogen oral therapy   Letrozole 2.5 mg daily    03/23/2017 Imaging   DG Bone Density 03/23/17 ASSESSMENT: The BMD measured at Femur Neck Right is 0.809 g/cm2 with a T-score of -1.6. This patient is considered osteopenic according to [facility]) criteria.   09/08/2017 Mammogram   Diagnostic Mammogram 09/08/17  IMPRESSION: No mammographic evidence of malignancy in either breast, status post right lumpectomy. RECOMMENDATION: Diagnostic mammogram is suggested in 1 year. (Code:[id number])   09/09/2018 Mammogram   09/10/2019 Mammogram IMPRESSION: No mammographic evidence of malignancy.   Uterine cancer ([place])  08/25/2019 Imaging   US pelvis Heterogeneous mass in the fundus of the uterus as well as a mass in the region of the cervix and vagina. These are felt to represent underlying neoplasm. Direct visualization is recommended as well as possible MRI of the pelvis for further delineation.   09/01/2019 Pathology Results   Endocervical biopsy - HIGH-GRADE ENDOMETRIOID ADENOCARCINOMA. SEE NOTE Immunohistochemical stains show that the tumor cells are diffusely positive for p16, p53 and vimentin. Tumor cells show focal staining for ER and are negative for monoclonal CEA. This immunophenotype is consistent with a gynecologic primary.  ER 40% positive   09/08/2019 Imaging   Ct scan of chest, abdomen and pelvis showed: 1. Acute bilateral pulmonary embolism with large saddle embolus. Dilated main pulmonary artery suggesting pulmonary hypertension. Patient will be brought immediately to the emergency department at [facility] from the lobby of the radiology department per instructions of Dr.  Denman George. 2. Infiltrative poorly marginated heterogeneously enhancing 7.0 x 4.3 x 5.0 cm midline pelvic mass centered in the region of the vaginal fornix with involvement of the uterine cervix and with intimate association with the anterior rectal wall, suspicious for malignancy of uncertain primary site. 3. Extensive lymphadenopathy throughout the mediastinum, retroperitoneum and bilateral pelvis, likely metastatic adenopathy. 4. Enlarged uterus with apparent 7.5 cm anterior uterine body solid mass, nonspecific, potentially large uterine fibroid. No adnexal masses. No ascites. 5. Cholelithiasis. Nonspecific gas in the fundal gallbladder. Consider short-term follow-up CT abdomen to exclude emphysematous cholecystitis. No biliary ductal dilatation. 6. Marked colonic diverticulosis. 7.  Aortic Atherosclerosis (ICD10-I70.0).   09/08/2019 - 09/12/2019 Hospital Admission   She was admitted to the hospital for management of severe PE.  Her anticoagulation  therapy was discontinued and switched to Lovenox   09/14/2019 Cancer Staging   Staging form: Corpus Uteri - Carcinoma and Carcinosarcoma, AJCC 8th Edition - Clinical stage from 09/14/2019: FIGO Stage IVB (cT3, cN2, pM1) - Signed by Heath Lark, MD on 09/14/2019   09/21/2019 Procedure   Successful left arm power PICC line placement with ultrasound and fluoroscopic guidance. The catheter is ready for use.   09/26/2019 -  Chemotherapy   The patient had carboplatin for chemotherapy treatment.     11/28/2019 Imaging   Ct chest, abdomen and pelvis 1. Interval decrease in size of a mass centered about the cervix, vaginal fornix, and closely abutting the superior rectum, consistent with treatment response. 2. Interval decrease in size of numerous enlarged mediastinal, retroperitoneal, iliac, and pelvic sidewall lymph nodes, findings consistent with treatment response of metastatic disease. 3. There is an enlarged left axillary lymph node measuring 2.6 x 2.0 cm  (series 2, image 3), which appears to be new when compared to prior examination although imaging of the left axilla at that time was limited by patient arm in the down position and adjacent arthroplasty. There are newly enlarged left superior mediastinal/supraclavicular lymph nodes measuring up to 1.6 x 1.1 cm (series 2, image 4). Findings are concerning for mixed treatment response. Attention on follow-up. If possible, recommend performing all future examinations with patient arm in the elevated position to ensure adequate evaluation of the axilla. 4. 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 No new embolus noted. 5. Unchanged postoperative and post treatment appearance of the right breast. 6. Stable appearance of air within the gallbladder fundus or gallbladder wall, closely adjacent to a loop of distal small bowel, suggesting chronic enteric fistula. 7. 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 8. Aortic Atherosclerosis (ICD10-I70.0).       REVIEW OF SYSTEMS:   Constitutional: Denies fevers, chills or abnormal weight loss Eyes: Denies blurriness of vision Ears, nose, mouth, throat, and face: Denies mucositis or sore throat Respiratory: Denies cough, dyspnea or wheezes Cardiovascular: Denies palpitation, chest discomfort or lower extremity swelling Gastrointestinal:  Denies nausea, heartburn or change in bowel habits Skin: Denies abnormal skin rashes Lymphatics: Denies new lymphadenopathy or easy bruising Neurological:Denies numbness, tingling or new weaknesses Behavioral/Psych: Mood is stable, no new changes  All other systems were reviewed with the patient and are negative.  I have reviewed the past medical history, past surgical  history, social history and family history with the patient and they are unchanged from previous note.  ALLERGIES:  is allergic to other.  MEDICATIONS:  Current Outpatient Medications  Medication Sig Dispense Refill  . amLODipine (NORVASC) 5 MG tablet Take 1 tablet (5 mg total) by mouth daily. 30 tablet 0  . cetirizine (ZYRTEC) 10 MG tablet Take 10 mg by mouth every evening.     . Cholecalciferol (VITAMIN D-3) 125 MCG (5000 UT) TABS Take 5,000 Units by mouth daily.    . diphenoxylate-atropine (LOMOTIL) 2.5-0.025 MG tablet TAKE 1 TABLET BY MOUTH FOUR TIMES DAILY AS NEEDED FOR DIARRHEA OR LOOSE STOOLS 30 tablet 0  . enoxaparin (LOVENOX) 100 MG/ML injection Inject 1 mL (100 mg total) into the skin every 12 (twelve) hours. 60 mL 11  . exemestane (AROMASIN) 25 MG tablet TAKE 1 TABLET BY  MOUTH DAILY 30 tablet 6  . ferrous sulfate 325 (65 FE) MG tablet Take 325 mg by mouth daily with breakfast.    . lisinopril (ZESTRIL) 20 MG tablet Take 1 tablet (20 mg total) by mouth daily. 90 tablet 3  . loperamide (IMODIUM A-D) 2 MG tablet Take 2 mg by mouth 4 (four) times daily as needed.    . memantine (NAMENDA) 10 MG tablet TAKE ONE TABLET BY MOUTH TWICE DAILY (Patient taking differently: Take 10 mg by mouth 2 (two) times daily. ) 60 tablet 5  . nitrofurantoin (MACRODANTIN) 100 MG capsule Take 100 mg by mouth 2 (two) times daily.    . ondansetron (ZOFRAN) 8 MG tablet Take 1 tablet (8 mg total) by mouth every 8 (eight) hours as needed. 30 tablet 1  . Probiotic Product (PROBIOTIC PO) Take 1 capsule by mouth daily.     . prochlorperazine (COMPAZINE) 10 MG tablet Take 1 tablet (10 mg total) by mouth every 6 (six) hours as needed (Nausea or vomiting). 30 tablet 1  . rosuvastatin (CRESTOR) 10 MG tablet TAKE ONE TABLET EVERY DAY 30 tablet 2  . sertraline (ZOLOFT) 100 MG tablet Take 1 tablet (100 mg total) by mouth daily. 90 tablet 3  . Sodium Chloride Flush (NORMAL SALINE FLUSH) 0.9 % SOLN Inject 10 ml daily to each  lumen, double lumen PICC so please dispense 60 prefilled syringes 600 mL 0   No current facility-administered medications for this visit.    PHYSICAL EXAMINATION: ECOG PERFORMANCE STATUS: 2 - Symptomatic, <50% confined to bed  Vitals:   01/30/20 1204  BP: (!) 144/61  Pulse: 76  Resp: 19  Temp: 98.3 F (36.8 C)  SpO2: 98%   Filed Weights   01/30/20 1204  Weight: 227 lb (103 kg)    GENERAL:alert, no distress and comfortable SKIN: skin color, texture, turgor are normal, no rashes or significant lesions EYES: normal, Conjunctiva are pink and non-injected, sclera clear OROPHARYNX:no exudate, no erythema and lips, buccal mucosa, and tongue normal  NECK: supple, thyroid normal size, non-tender, without nodularity LYMPH:  no palpable lymphadenopathy in the cervical, axillary or inguinal LUNGS: clear to auscultation and percussion with normal breathing effort HEART: regular rate & rhythm and no murmurs and no lower extremity edema ABDOMEN:abdomen soft, non-tender and normal bowel sounds Musculoskeletal:no cyanosis of digits and no clubbing  NEURO: alert & oriented x 3 with fluent speech  LABORATORY DATA:  I have reviewed the data as listed    Component Value Date/Time   NA 141 01/30/2020 1142   NA 140 09/22/2017 1003   K 4.6 01/30/2020 1142   K 3.8 09/22/2017 1003   CL 107 01/30/2020 1142   CO2 28 01/30/2020 1142   CO2 29 09/22/2017 1003   GLUCOSE 111 (H) 01/30/2020 1142   GLUCOSE 116 09/22/2017 1003   BUN 20 01/30/2020 1142   BUN 22.4 09/22/2017 1003   CREATININE 0.72 01/30/2020 1142   CREATININE 0.71 07/02/2018 1514   CREATININE 0.8 09/22/2017 1003   CALCIUM 9.3 01/30/2020 1142   CALCIUM 10.0 09/22/2017 1003   PROT 6.9 01/30/2020 1142   PROT 7.1 09/22/2017 1003   ALBUMIN 3.4 (L) 01/30/2020 1142   ALBUMIN 3.1 (L) 09/22/2017 1003   AST 11 (L) 01/30/2020 1142   AST 14 09/22/2017 1003   ALT 13 01/30/2020 1142   ALT 9 09/22/2017 1003   ALKPHOS 85 01/30/2020 1142    ALKPHOS 126 09/22/2017 1003   BILITOT 0.3 01/30/2020 1142   BILITOT  0.24 09/22/2017 1003   GFRNONAA >60 01/30/2020 1142   GFRAA >60 01/30/2020 1142    No results found for: SPEP, UPEP  Lab Results  Component Value Date   WBC 2.9 (L) 01/30/2020   NEUTROABS 1.3 (L) 01/30/2020   HGB 7.9 (L) 01/30/2020   HCT 24.9 (L) 01/30/2020   MCV 111.2 (H) 01/30/2020   PLT 75 (L) 01/30/2020      Chemistry      Component Value Date/Time   NA 141 01/30/2020 1142   NA 140 09/22/2017 1003   K 4.6 01/30/2020 1142   K 3.8 09/22/2017 1003   CL 107 01/30/2020 1142   CO2 28 01/30/2020 1142   CO2 29 09/22/2017 1003   BUN 20 01/30/2020 1142   BUN 22.4 09/22/2017 1003   CREATININE 0.72 01/30/2020 1142   CREATININE 0.71 07/02/2018 1514   CREATININE 0.8 09/22/2017 1003      Component Value Date/Time   CALCIUM 9.3 01/30/2020 1142   CALCIUM 10.0 09/22/2017 1003   ALKPHOS 85 01/30/2020 1142   ALKPHOS 126 09/22/2017 1003   AST 11 (L) 01/30/2020 1142   AST 14 09/22/2017 1003   ALT 13 01/30/2020 1142   ALT 9 09/22/2017 1003   BILITOT 0.3 01/30/2020 1142   BILITOT 0.24 09/22/2017 1003

## 2020-01-31 NOTE — Assessment & Plan Note (Signed)
The patient has failed anticoagulation therapy in the past but is doing well on Lovenox She has ongoing vaginal bleeding We discussed the risk and benefits of discontinuation of anticoagulation therapy versus continuing treatment and risking severe bleeding and transfusion Due to her dementia, ultimately, her sister made the decision for her to continue on treatment

## 2020-01-31 NOTE — Assessment & Plan Note (Signed)
She has multifactorial anemia, component of vitamin B12 deficiency of which she has received replacement therapy, ongoing vaginal bleeding and bone marrow suppressive side effects from recent chemotherapy  We discussed some of the risks, benefits, and alternatives of blood transfusions. The patient is symptomatic from anemia and the hemoglobin level is critically low.  Some of the side-effects to be expected including risks of transfusion reactions, chills, infection, syndrome of volume overload and risk of hospitalization from various reasons and the patient is willing to proceed and went ahead to sign consent today. We will proceed with 1 unit of blood We will delay chemotherapy by 1 week She does not need platelet transfusion support right now She does not need G-CSF support We discussed neutropenic precaution

## 2020-02-03 ENCOUNTER — Telehealth: Payer: Self-pay | Admitting: Hematology and Oncology

## 2020-02-03 NOTE — Telephone Encounter (Signed)
Scheduled appt per 2/10 sch message - pt sister aware of appt date and time

## 2020-02-07 ENCOUNTER — Inpatient Hospital Stay: Payer: Medicare PPO

## 2020-02-07 ENCOUNTER — Other Ambulatory Visit: Payer: Self-pay

## 2020-02-07 VITALS — BP 139/62 | HR 68 | Temp 98.0°F | Resp 17

## 2020-02-07 DIAGNOSIS — C50311 Malignant neoplasm of lower-inner quadrant of right female breast: Secondary | ICD-10-CM

## 2020-02-07 DIAGNOSIS — Z5111 Encounter for antineoplastic chemotherapy: Secondary | ICD-10-CM | POA: Diagnosis not present

## 2020-02-07 DIAGNOSIS — Z95828 Presence of other vascular implants and grafts: Secondary | ICD-10-CM

## 2020-02-07 DIAGNOSIS — Z170 Estrogen receptor positive status [ER+]: Secondary | ICD-10-CM

## 2020-02-07 DIAGNOSIS — C55 Malignant neoplasm of uterus, part unspecified: Secondary | ICD-10-CM

## 2020-02-07 DIAGNOSIS — Z7189 Other specified counseling: Secondary | ICD-10-CM

## 2020-02-07 LAB — CBC WITH DIFFERENTIAL (CANCER CENTER ONLY)
Abs Immature Granulocytes: 0 10*3/uL (ref 0.00–0.07)
Basophils Absolute: 0 10*3/uL (ref 0.0–0.1)
Basophils Relative: 0 %
Eosinophils Absolute: 0 10*3/uL (ref 0.0–0.5)
Eosinophils Relative: 1 %
HCT: 26.4 % — ABNORMAL LOW (ref 36.0–46.0)
Hemoglobin: 8.4 g/dL — ABNORMAL LOW (ref 12.0–15.0)
Immature Granulocytes: 0 %
Lymphocytes Relative: 39 %
Lymphs Abs: 1.4 10*3/uL (ref 0.7–4.0)
MCH: 34.7 pg — ABNORMAL HIGH (ref 26.0–34.0)
MCHC: 31.8 g/dL (ref 30.0–36.0)
MCV: 109.1 fL — ABNORMAL HIGH (ref 80.0–100.0)
Monocytes Absolute: 0.3 10*3/uL (ref 0.1–1.0)
Monocytes Relative: 8 %
Neutro Abs: 2 10*3/uL (ref 1.7–7.7)
Neutrophils Relative %: 52 %
Platelet Count: 108 10*3/uL — ABNORMAL LOW (ref 150–400)
RBC: 2.42 MIL/uL — ABNORMAL LOW (ref 3.87–5.11)
RDW: 16.4 % — ABNORMAL HIGH (ref 11.5–15.5)
WBC Count: 3.7 10*3/uL — ABNORMAL LOW (ref 4.0–10.5)
nRBC: 0 % (ref 0.0–0.2)

## 2020-02-07 LAB — CMP (CANCER CENTER ONLY)
ALT: 9 U/L (ref 0–44)
AST: 9 U/L — ABNORMAL LOW (ref 15–41)
Albumin: 3.3 g/dL — ABNORMAL LOW (ref 3.5–5.0)
Alkaline Phosphatase: 87 U/L (ref 38–126)
Anion gap: 6 (ref 5–15)
BUN: 22 mg/dL (ref 8–23)
CO2: 29 mmol/L (ref 22–32)
Calcium: 9.5 mg/dL (ref 8.9–10.3)
Chloride: 106 mmol/L (ref 98–111)
Creatinine: 0.77 mg/dL (ref 0.44–1.00)
GFR, Est AFR Am: >60 mL/min (ref >60)
GFR, Estimated: >60 mL/min (ref >60)
Glucose, Bld: 144 mg/dL — ABNORMAL HIGH (ref 70–99)
Potassium: 4.4 mmol/L (ref 3.5–5.1)
Sodium: 141 mmol/L (ref 135–145)
Total Bilirubin: 0.2 mg/dL — ABNORMAL LOW (ref 0.3–1.2)
Total Protein: 7 g/dL (ref 6.5–8.1)

## 2020-02-07 MED ORDER — SODIUM CHLORIDE 0.9 % IV SOLN
Freq: Once | INTRAVENOUS | Status: AC
  Filled 2020-02-07: qty 5

## 2020-02-07 MED ORDER — SODIUM CHLORIDE 0.9 % IV SOLN
Freq: Once | INTRAVENOUS | Status: AC
  Filled 2020-02-07: qty 250

## 2020-02-07 MED ORDER — SODIUM CHLORIDE 0.9% FLUSH
10.0000 mL | Freq: Once | INTRAVENOUS | Status: AC
  Administered 2020-02-07: 10 mL
  Filled 2020-02-07: qty 10

## 2020-02-07 MED ORDER — FAMOTIDINE IN NACL 20-0.9 MG/50ML-% IV SOLN
INTRAVENOUS | Status: AC
  Filled 2020-02-07: qty 50

## 2020-02-07 MED ORDER — DIPHENHYDRAMINE HCL 50 MG/ML IJ SOLN
25.0000 mg | Freq: Once | INTRAMUSCULAR | Status: AC
  Administered 2020-02-07: 25 mg via INTRAVENOUS

## 2020-02-07 MED ORDER — FAMOTIDINE IN NACL 20-0.9 MG/50ML-% IV SOLN
20.0000 mg | Freq: Once | INTRAVENOUS | Status: AC
  Administered 2020-02-07: 20 mg via INTRAVENOUS

## 2020-02-07 MED ORDER — DIPHENHYDRAMINE HCL 50 MG/ML IJ SOLN
INTRAMUSCULAR | Status: AC
  Filled 2020-02-07: qty 1

## 2020-02-07 MED ORDER — PALONOSETRON HCL INJECTION 0.25 MG/5ML
0.2500 mg | Freq: Once | INTRAVENOUS | Status: AC
  Administered 2020-02-07: 0.25 mg via INTRAVENOUS

## 2020-02-07 MED ORDER — PALONOSETRON HCL INJECTION 0.25 MG/5ML
INTRAVENOUS | Status: AC
  Filled 2020-02-07: qty 5

## 2020-02-07 MED ORDER — SODIUM CHLORIDE 0.9 % IV SOLN
400.0000 mg | Freq: Once | INTRAVENOUS | Status: AC
  Administered 2020-02-07: 400 mg via INTRAVENOUS
  Filled 2020-02-07: qty 40

## 2020-02-07 MED ORDER — HEPARIN SOD (PORK) LOCK FLUSH 100 UNIT/ML IV SOLN
250.0000 [IU] | Freq: Once | INTRAVENOUS | Status: AC | PRN
  Administered 2020-02-07: 250 [IU]
  Filled 2020-02-07: qty 5

## 2020-02-07 MED ORDER — SODIUM CHLORIDE 0.9% FLUSH
3.0000 mL | INTRAVENOUS | Status: DC | PRN
  Administered 2020-02-07: 3 mL
  Filled 2020-02-07: qty 10

## 2020-02-07 MED FILL — ENOXAPARIN SODIUM 100 MG/ML: 100 | 30 days supply | Qty: 60 | Fill #3

## 2020-02-07 NOTE — Patient Instructions (Signed)
[facility] Discharge Instructions for Patients Receiving Chemotherapy  Today you received the following chemotherapy agents: Carboplatin (Paraplatin)  To help prevent nausea and vomiting after your treatment, we encourage you to take your nausea medication as directed.   If you develop nausea and vomiting that is not controlled by your nausea medication, call the clinic.   BELOW ARE SYMPTOMS THAT SHOULD BE REPORTED IMMEDIATELY:  *FEVER GREATER THAN 100.5 F  *CHILLS WITH OR WITHOUT FEVER  NAUSEA AND VOMITING THAT IS NOT CONTROLLED WITH YOUR NAUSEA MEDICATION  *UNUSUAL SHORTNESS OF BREATH  *UNUSUAL BRUISING OR BLEEDING  TENDERNESS IN MOUTH AND THROAT WITH OR WITHOUT PRESENCE OF ULCERS  *URINARY PROBLEMS  *BOWEL PROBLEMS  UNUSUAL RASH Items with * indicate a potential emergency and should be followed up as soon as possible.  Feel free to call the clinic should you have any questions or concerns. The clinic phone number is [phone].  Please show the [facility] at check-in to the Emergency Department and triage nurse.  Coronavirus (COVID-19) Are you at risk?  Are you at risk for the Coronavirus (COVID-19)?  To be considered HIGH RISK for Coronavirus (COVID-19), you have to meet the following criteria:  . Traveled to [place], [place], [place], [place] or [place]; or in the [place] to [place], [place], [place], or [place]; and have fever, cough, and shortness of breath within the last 2 weeks of travel OR . Been in close contact with a person diagnosed with COVID-19 within the last 2 weeks and have fever, cough, and shortness of breath . IF YOU DO NOT MEET THESE CRITERIA, YOU ARE CONSIDERED LOW RISK FOR COVID-19.  What to do if you are HIGH RISK for COVID-19?  Marland Kitchen If you are having a medical emergency, call 911. . Seek medical care right away. Before you go to a doctor's office, urgent care or emergency department, call ahead and tell them  about your recent travel, contact with someone diagnosed with COVID-19, and your symptoms. You should receive instructions from your physician's office regarding next steps of care.  . When you arrive at healthcare provider, tell the healthcare staff immediately you have returned from visiting [place], [place], [place], [place] or [place]; or traveled in the [place] to [place], [place], [place], or [place]; in the last two weeks or you have been in close contact with a person diagnosed with COVID-19 in the last 2 weeks.   . Tell the health care staff about your symptoms: fever, cough and shortness of breath. . After you have been seen by a medical provider, you will be either: o Tested for (COVID-19) and discharged home on quarantine except to seek medical care if symptoms worsen, and asked to  - Stay home and avoid contact with others until you get your results (4-5 days)  - Avoid travel on public transportation if possible (such as bus, train, or airplane) or o Sent to the Emergency Department by EMS for evaluation, COVID-19 testing, and possible admission depending on your condition and test results.  What to do if you are LOW RISK for COVID-19?  Reduce your risk of any infection by using the same precautions used for avoiding the common cold or flu:  Marland Kitchen Wash your hands often with soap and warm water for at least 20 seconds.  If soap and water are not readily available, use an alcohol-based hand sanitizer with at least 60% alcohol.  . If coughing or  sneezing, cover your mouth and nose by coughing or sneezing into the elbow areas of your shirt or coat, into a tissue or into your sleeve (not your hands). . Avoid shaking hands with others and consider head nods or verbal greetings only. . Avoid touching your eyes, nose, or mouth with unwashed hands.  . Avoid close contact with people who are sick. . Avoid places or events with large numbers of people in one location, like concerts or  sporting events. . Carefully consider travel plans you have or are making. . If you are planning any travel outside or inside the [place], visit the CDC's Travelers' Health webpage for the latest health notices. . If you have some symptoms but not all symptoms, continue to monitor at home and seek medical attention if your symptoms worsen. . If you are having a medical emergency, call 911.   [place] / e-Visit: [email or web address]         [facility]: [facility]: [phone]                   [facility]: [phone]

## 2020-02-09 ENCOUNTER — Telehealth: Payer: Self-pay | Admitting: Oncology

## 2020-02-09 NOTE — Telephone Encounter (Signed)
I had extensive discussion with her sister in the past For now, I advise her to stop the lovenox I suggest phone/virtual visit tomorrow morning to discuss next step

## 2020-02-09 NOTE — Telephone Encounter (Signed)
Tiffany Arnold (sister) called and said Tiffany Arnold had a lot of vaginal bleeding yesterday - so much that it was running down her leg when she used the bathroom according to her caregiver.  She has not heard from [name]'s caregiver this morning yet about her bleeding. She is wondering if they should stop the "blood thinner shots" or what they should do.

## 2020-02-09 NOTE — Telephone Encounter (Signed)
Called Tiffany Arnold and advised her to have [name] stop the lovenox for now.  She said she has already taken her morning dose but will hold the evening one and tomorrow morning.   Scheduled phone appointment with Dr. Alvy Bimler for 10:30 am tomorrow.

## 2020-02-10 ENCOUNTER — Inpatient Hospital Stay (HOSPITAL_BASED_OUTPATIENT_CLINIC_OR_DEPARTMENT_OTHER): Payer: Medicare PPO | Admitting: Hematology and Oncology

## 2020-02-10 DIAGNOSIS — D61818 Other pancytopenia: Secondary | ICD-10-CM

## 2020-02-10 DIAGNOSIS — Z7189 Other specified counseling: Secondary | ICD-10-CM

## 2020-02-10 DIAGNOSIS — I2692 Saddle embolus of pulmonary artery without acute cor pulmonale: Secondary | ICD-10-CM

## 2020-02-10 DIAGNOSIS — C55 Malignant neoplasm of uterus, part unspecified: Secondary | ICD-10-CM | POA: Diagnosis not present

## 2020-02-10 DIAGNOSIS — Z5111 Encounter for antineoplastic chemotherapy: Secondary | ICD-10-CM | POA: Diagnosis not present

## 2020-02-10 NOTE — Assessment & Plan Note (Signed)
We had multiple goals of care discussion on and off over the past given her significant major comorbidities Her sister is her dedicated healthcare power of attorney and she agree with the plan of care as outlined above

## 2020-02-10 NOTE — Assessment & Plan Note (Signed)
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 Her CT imaging in December showed partial response to therapy She has taken Lovenox injection for 5 months, complicated by severe bleeding requiring transfusion support I have a long discussion with the family about the risk and benefits of continuing Lovenox treatment versus discontinuation of Lovenox and risking recurrent DVT/PE After withholding Lovenox injection for approximately 24 hours, her bleeding has subsided For now, I recommend resumption of Lovenox today and to observe closely over the weekend I did offer the patient and family to check her blood count today but they are comfortable to wait until next week I recommend repeating CT scan of the chest with contrast next week for objective assessment of the burden of blood clot and to see if we can transition the dose of Lovenox from full anticoagulation dose to DVT prophylaxis dose

## 2020-02-10 NOTE — Assessment & Plan Note (Addendum)
I had extensive discussion with her sister and caregivers Her recent CT imaging show some mixed response With her ongoing severe vaginal bleeding, I recommend we proceed with CT imaging next week for objective assessment of response to therapy We could consider additional treatment with radiation therapy to stop her vaginal bleeding Assuming she can get her CT scan scheduled for Monday, I will see her on Tuesday next week for further reevaluation and plan of care I will try to work with my team to get urgent insurance prior authorization for her CT imaging

## 2020-02-10 NOTE — Assessment & Plan Note (Signed)
She has multifactorial pancytopenia, combination of vitamin B12 deficiency, recent side effects of bone marrow suppression from chemotherapy and ongoing consumption of platelets from bleeding She had received blood transfusion recently I recommend consideration to check her blood counts sooner today but the family is okay to wait until next week when she is scheduled for blood work and PICC line care on Tuesday

## 2020-02-10 NOTE — Progress Notes (Signed)
HEMATOLOGY-ONCOLOGY ELECTRONIC VISIT PROGRESS NOTE  Patient Care Team: [name], NP as PCP - General (Internal Medicine)  I connected with by [facility] video conference and verified that I am speaking with the correct person using two identifiers.  Her sister, caregiver and daughter were also available over the telephone. Due to technology failure, the electronic visit was converted to telephone visit  ASSESSMENT & PLAN:  Uterine cancer [facility]) I had extensive discussion with her sister and caregivers Her recent CT imaging show some mixed response With her ongoing severe vaginal bleeding, I recommend we proceed with CT imaging next week for objective assessment of response to therapy We could consider additional treatment with radiation therapy to stop her vaginal bleeding Assuming she can get her CT scan scheduled for Monday, I will see her on Tuesday next week for further reevaluation and plan of care I will try to work with my team to get urgent insurance prior authorization for her CT imaging  Pulmonary embolism (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 Her CT imaging in December showed partial response to therapy She has taken Lovenox injection for 5 months, complicated by severe bleeding requiring transfusion support I have a long discussion with the family about the risk and benefits of continuing Lovenox treatment versus discontinuation of Lovenox and risking recurrent DVT/PE After withholding Lovenox injection for approximately 24 hours, her bleeding has subsided For now, I recommend resumption of Lovenox today and to observe closely over the weekend I did offer the patient and family to check her blood count today but they are comfortable to wait until next week I recommend repeating CT scan of the chest with contrast next week for objective assessment of the burden of blood clot and to see if we can transition the dose of Lovenox from full  anticoagulation dose to DVT prophylaxis dose  Pancytopenia, acquired ([place]) She has multifactorial pancytopenia, combination of vitamin B12 deficiency, recent side effects of bone marrow suppression from chemotherapy and ongoing consumption of platelets from bleeding She had received blood transfusion recently I recommend consideration to check her blood counts sooner today but the family is okay to wait until next week when she is scheduled for blood work and PICC line care on Tuesday  Goals of care, counseling/discussion We had multiple goals of care discussion on and off over the past given her significant major comorbidities Her sister is her dedicated healthcare power of attorney and she agree with the plan of care as outlined above   Orders Placed This Encounter  Procedures  . Sample to Blood Bank    Standing Status:   Standing    Number of Occurrences:   33    Standing Expiration Date:   02/09/2021    INTERVAL HISTORY: Please see below for problem oriented charting. The purpose of this visit is to follow-up on recent complain of severe hemorrhagic bleeding The patient has ongoing vaginal bleeding since diagnosis Her sister is her dedicated medical healthcare power of attorney and provided most of the information.  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 There are abnormal right axillary lymph nodes showing second cortical's, no evidence of malignancy in the left breast.   07/29/2016 Surgery   Right lumpectomy and ALND   07/29/2016 Pathology Results   Right lumpectomy showed G3 IDC, DCIS, margins (-), LVI(-).  1 of 16 nodes was positive    07/29/2016 Miscellaneous   Mammaprint showed high risk disease, luminal type B   09/09/2016 - 11/12/2016 Adjuvant Chemotherapy   Docetaxel and Cytoxan (TC) every 3 weeks   12/24/2016 -  02/03/2017 Radiation Therapy   Adjuvant breast radiation 12/24/16 - 02/03/17 : Right Breast treated to 50.4 Gy in 28 fractions. Right Axilla treated to 45 Gy in 25 fractions.   02/17/2017 -  Anti-estrogen oral therapy   Letrozole 2.5 mg daily    03/23/2017 Imaging   DG Bone Density 03/23/17 ASSESSMENT: The BMD measured at Femur Neck Right is 0.809 g/cm2 with a T-score of -1.6. This patient is considered osteopenic according to [facility]) criteria.   09/08/2017 Mammogram   Diagnostic Mammogram 09/08/17  IMPRESSION: No mammographic evidence of malignancy in either breast, status post right lumpectomy. RECOMMENDATION: Diagnostic mammogram is suggested in 1 year. (Code:[id number])   09/09/2018 Mammogram   09/10/2019 Mammogram IMPRESSION: No mammographic evidence of malignancy.   Uterine cancer ([place])  08/25/2019 Imaging   US pelvis Heterogeneous mass in the fundus of the uterus as well as a mass in the region of the cervix and vagina. These are felt to represent underlying neoplasm. Direct visualization is recommended as well as possible MRI of the pelvis for further delineation.   09/01/2019 Pathology Results   Endocervical biopsy - HIGH-GRADE ENDOMETRIOID ADENOCARCINOMA. SEE NOTE Immunohistochemical stains show that the tumor cells are diffusely positive for p16, p53 and vimentin. Tumor cells show focal staining for ER and are negative for monoclonal CEA. This immunophenotype is consistent with a gynecologic primary.  ER 40% positive   09/08/2019  Imaging   Ct scan of chest, abdomen and pelvis showed: 1. Acute bilateral pulmonary embolism with large saddle embolus. Dilated main pulmonary artery suggesting pulmonary hypertension. Patient will be brought immediately to the emergency department at [facility] from the lobby of the radiology department per instructions of Dr. Denman George. 2. Infiltrative poorly marginated heterogeneously enhancing 7.0 x 4.3 x 5.0 cm midline  pelvic mass centered in the region of the vaginal fornix with involvement of the uterine cervix and with intimate association with the anterior rectal wall, suspicious for malignancy of uncertain primary site. 3. Extensive lymphadenopathy throughout the mediastinum, retroperitoneum and bilateral pelvis, likely metastatic adenopathy. 4. Enlarged uterus with apparent 7.5 cm anterior uterine body solid mass, nonspecific, potentially large uterine fibroid. No adnexal masses. No ascites. 5. Cholelithiasis. Nonspecific gas in the fundal gallbladder. Consider short-term follow-up CT abdomen to exclude emphysematous cholecystitis. No biliary ductal dilatation. 6. Marked colonic diverticulosis. 7.  Aortic Atherosclerosis (ICD10-I70.0).   09/08/2019 - 09/12/2019 Hospital Admission   She was admitted to the hospital for management of severe PE.  Her anticoagulation therapy was discontinued and switched to Lovenox   09/14/2019 Cancer Staging   Staging form: Corpus Uteri - Carcinoma and Carcinosarcoma, AJCC 8th Edition - Clinical stage from 09/14/2019: FIGO Stage IVB (cT3, cN2, pM1) - Signed by Heath Lark, MD on 09/14/2019   09/21/2019 Procedure   Successful left arm power PICC line placement with ultrasound and fluoroscopic guidance. The catheter is ready for use.   09/26/2019 -  Chemotherapy   The patient had carboplatin for chemotherapy treatment.     11/28/2019 Imaging   Ct chest, abdomen and pelvis 1. Interval decrease in size of a mass centered about the cervix, vaginal fornix, and closely abutting the superior rectum, consistent with treatment response. 2. Interval decrease in size of numerous enlarged mediastinal, retroperitoneal, iliac, and pelvic sidewall lymph nodes, findings consistent with treatment response of metastatic disease. 3. There is an enlarged left axillary lymph node measuring 2.6 x 2.0 cm (series 2, image 3), which appears to be new when compared to prior examination although imaging  of the left axilla at that time was limited by patient arm in the down position and adjacent arthroplasty. There are newly enlarged left superior mediastinal/supraclavicular lymph nodes measuring up to 1.6 x 1.1 cm (series 2, image 4). Findings are concerning for mixed treatment response. Attention on follow-up. If possible, recommend performing all future examinations with patient arm in the elevated position to ensure adequate evaluation of the axilla. 4. 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 No new embolus noted. 5. Unchanged postoperative and post treatment appearance of the right breast. 6. Stable appearance of air within the gallbladder fundus or gallbladder wall, closely adjacent to a loop of distal small bowel, suggesting chronic enteric fistula. 7. 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 8. Aortic Atherosclerosis (ICD10-I70.0).       REVIEW OF SYSTEMS:   Constitutional: Denies fevers, chills or abnormal weight loss Eyes: Denies blurriness of vision Ears, nose, mouth, throat, and face: Denies mucositis or sore throat Respiratory: Denies cough, dyspnea or wheezes Cardiovascular: Denies palpitation, chest discomfort Gastrointestinal:  Denies nausea, heartburn or change in bowel habits Skin: Denies abnormal skin rashes Lymphatics: Denies new lymphadenopathy or easy bruising Neurological:Denies  numbness, tingling or new weaknesses Behavioral/Psych: Mood is stable, no new changes  Extremities: No lower extremity edema All other systems were reviewed with the patient and are negative.  I have reviewed the past medical history, past surgical history, social history and family history with the patient and they are unchanged from  previous note.  ALLERGIES:  is allergic to other.  MEDICATIONS:  Current Outpatient Medications  Medication Sig Dispense Refill  . amLODipine (NORVASC) 5 MG tablet Take 1 tablet (5 mg total) by mouth daily. 30 tablet 0  . cetirizine (ZYRTEC) 10 MG tablet Take 10 mg by mouth every evening.     . Cholecalciferol (VITAMIN D-3) 125 MCG (5000 UT) TABS Take 5,000 Units by mouth daily.    . diphenoxylate-atropine (LOMOTIL) 2.5-0.025 MG tablet TAKE 1 TABLET BY MOUTH FOUR TIMES DAILY AS NEEDED FOR DIARRHEA OR LOOSE STOOLS 30 tablet 0  . enoxaparin (LOVENOX) 100 MG/ML injection Inject 1 mL (100 mg total) into the skin every 12 (twelve) hours. 60 mL 11  . exemestane (AROMASIN) 25 MG tablet TAKE 1 TABLET BY MOUTH DAILY 30 tablet 6  . FERROCITE 324 MG TABS tablet Take 1 tablet by mouth daily.    . ferrous sulfate 325 (65 FE) MG tablet Take 325 mg by mouth daily with breakfast.    . lisinopril (ZESTRIL) 20 MG tablet Take 1 tablet (20 mg total) by mouth daily. 90 tablet 3  . loperamide (IMODIUM A-D) 2 MG tablet Take 2 mg by mouth 4 (four) times daily as needed.    . memantine (NAMENDA) 10 MG tablet TAKE ONE TABLET BY MOUTH TWICE DAILY (Patient taking differently: Take 10 mg by mouth 2 (two) times daily. ) 60 tablet 5  . nitrofurantoin (MACRODANTIN) 100 MG capsule Take 100 mg by mouth 2 (two) times daily.    . ondansetron (ZOFRAN) 8 MG tablet Take 1 tablet (8 mg total) by mouth every 8 (eight) hours as needed. 30 tablet 1  . Probiotic Product (PROBIOTIC PO) Take 1 capsule by mouth daily.     . prochlorperazine (COMPAZINE) 10 MG tablet Take 1 tablet (10 mg total) by mouth every 6 (six) hours as needed (Nausea or vomiting). 30 tablet 1  . rosuvastatin (CRESTOR) 10 MG tablet TAKE ONE TABLET EVERY DAY 30 tablet 2  . sertraline (ZOLOFT) 100 MG tablet Take 1 tablet (100 mg total) by mouth daily. 90 tablet 3  . Sodium Chloride Flush (NORMAL SALINE FLUSH) 0.9 % SOLN Inject 10 ml daily to each lumen, double lumen  PICC so please dispense 60 prefilled syringes 600 mL 0   No current facility-administered medications for this visit.    PHYSICAL EXAMINATION: ECOG PERFORMANCE STATUS: 2 - Symptomatic, <50% confined to bed  LABORATORY DATA:  I have reviewed the data as listed CMP Latest Ref Rng & Units 02/07/2020 01/30/2020 01/10/2020  Glucose 70 - 99 mg/dL 144(H) 111(H) 181(H)  BUN 8 - 23 mg/dL 22 20 19   Creatinine 0.44 - 1.00 mg/dL 0.77 0.72 0.75  Sodium 135 - 145 mmol/L 141 141 141  Potassium 3.5 - 5.1 mmol/L 4.4 4.6 4.0  Chloride 98 - 111 mmol/L 106 107 106  CO2 22 - 32 mmol/L 29 28 28   Calcium 8.9 - 10.3 mg/dL 9.5 9.3 9.1  Total Protein 6.5 - 8.1 g/dL 7.0 6.9 6.8  Total Bilirubin 0.3 - 1.2 mg/dL 0.2(L) 0.3 <0.2(L)  Alkaline Phos 38 - 126 U/L 87 85 81  AST 15 - 41 U/L 9(L) 11(L) 10(L)  ALT 0 - 44 U/L 9 13 10     Lab Results  Component Value Date   WBC 3.7 (L) 02/07/2020   HGB 8.4 (L) 02/07/2020   HCT 26.4 (L) 02/07/2020   MCV 109.1 (H) 02/07/2020   PLT 108 (L) 02/07/2020   NEUTROABS 2.0 02/07/2020    I discussed the assessment and treatment plan with the patient and family. The patient/family were provided an opportunity to ask questions and all were answered.    I spent 30 minutes for the appointment reviewing test results, discuss management and coordination of care.  Heath Lark, MD 02/10/2020 10:53 AM

## 2020-02-13 ENCOUNTER — Other Ambulatory Visit: Payer: Self-pay

## 2020-02-13 ENCOUNTER — Encounter (HOSPITAL_COMMUNITY): Payer: Self-pay

## 2020-02-13 ENCOUNTER — Ambulatory Visit (HOSPITAL_COMMUNITY)
Admission: RE | Admit: 2020-02-13 | Discharge: 2020-02-13 | Disposition: A | Discharge status: Home / Self Care | Payer: Medicare PPO | Source: Ambulatory Visit | Attending: Hematology and Oncology | Admitting: Hematology and Oncology

## 2020-02-13 DIAGNOSIS — C55 Malignant neoplasm of uterus, part unspecified: Secondary | ICD-10-CM | POA: Insufficient documentation

## 2020-02-13 DIAGNOSIS — D539 Nutritional anemia, unspecified: Secondary | ICD-10-CM | POA: Insufficient documentation

## 2020-02-13 HISTORY — DX: Malignant neoplasm of uterus, part unspecified: C55

## 2020-02-13 MED ORDER — IOHEXOL 300 MG/ML  SOLN
100.0000 mL | Freq: Once | INTRAMUSCULAR | Status: AC | PRN
  Administered 2020-02-13: 100 mL via INTRAVENOUS

## 2020-02-13 MED ORDER — SODIUM CHLORIDE (PF) 0.9 % IJ SOLN
INTRAMUSCULAR | Status: AC
  Filled 2020-02-13: qty 50

## 2020-02-14 ENCOUNTER — Inpatient Hospital Stay (HOSPITAL_BASED_OUTPATIENT_CLINIC_OR_DEPARTMENT_OTHER): Payer: Medicare PPO | Admitting: Hematology and Oncology

## 2020-02-14 ENCOUNTER — Other Ambulatory Visit: Payer: Self-pay

## 2020-02-14 ENCOUNTER — Encounter: Payer: Self-pay | Admitting: Hematology and Oncology

## 2020-02-14 ENCOUNTER — Inpatient Hospital Stay: Payer: Medicare PPO

## 2020-02-14 VITALS — BP 134/66 | HR 71 | Temp 98.3°F | Resp 18 | Ht 61.0 in | Wt 223.6 lb

## 2020-02-14 DIAGNOSIS — Z7189 Other specified counseling: Secondary | ICD-10-CM

## 2020-02-14 DIAGNOSIS — C50311 Malignant neoplasm of lower-inner quadrant of right female breast: Secondary | ICD-10-CM

## 2020-02-14 DIAGNOSIS — I2692 Saddle embolus of pulmonary artery without acute cor pulmonale: Secondary | ICD-10-CM | POA: Diagnosis not present

## 2020-02-14 DIAGNOSIS — D539 Nutritional anemia, unspecified: Secondary | ICD-10-CM

## 2020-02-14 DIAGNOSIS — C55 Malignant neoplasm of uterus, part unspecified: Secondary | ICD-10-CM

## 2020-02-14 DIAGNOSIS — Z95828 Presence of other vascular implants and grafts: Secondary | ICD-10-CM

## 2020-02-14 DIAGNOSIS — D61818 Other pancytopenia: Secondary | ICD-10-CM | POA: Diagnosis not present

## 2020-02-14 DIAGNOSIS — Z5111 Encounter for antineoplastic chemotherapy: Secondary | ICD-10-CM | POA: Diagnosis not present

## 2020-02-14 LAB — CMP (CANCER CENTER ONLY)
ALT: 6 U/L (ref 0–44)
AST: 10 U/L — ABNORMAL LOW (ref 15–41)
Albumin: 3 g/dL — ABNORMAL LOW (ref 3.5–5.0)
Alkaline Phosphatase: 82 U/L (ref 38–126)
Anion gap: 6 (ref 5–15)
BUN: 21 mg/dL (ref 8–23)
CO2: 30 mmol/L (ref 22–32)
Calcium: 9.2 mg/dL (ref 8.9–10.3)
Chloride: 103 mmol/L (ref 98–111)
Creatinine: 0.69 mg/dL (ref 0.44–1.00)
GFR, Est AFR Am: >60 mL/min (ref >60)
GFR, Estimated: >60 mL/min (ref >60)
Glucose, Bld: 134 mg/dL — ABNORMAL HIGH (ref 70–99)
Potassium: 4.1 mmol/L (ref 3.5–5.1)
Sodium: 139 mmol/L (ref 135–145)
Total Bilirubin: <0.2 mg/dL — ABNORMAL LOW (ref 0.3–1.2)
Total Protein: 6.5 g/dL (ref 6.5–8.1)

## 2020-02-14 LAB — CBC WITH DIFFERENTIAL (CANCER CENTER ONLY)
Abs Immature Granulocytes: 0.01 10*3/uL (ref 0.00–0.07)
Basophils Absolute: 0 10*3/uL (ref 0.0–0.1)
Basophils Relative: 0 %
Eosinophils Absolute: 0 10*3/uL (ref 0.0–0.5)
Eosinophils Relative: 1 %
HCT: 21.9 % — ABNORMAL LOW (ref 36.0–46.0)
Hemoglobin: 7 g/dL — ABNORMAL LOW (ref 12.0–15.0)
Immature Granulocytes: 0 %
Lymphocytes Relative: 41 %
Lymphs Abs: 1.4 10*3/uL (ref 0.7–4.0)
MCH: 35.2 pg — ABNORMAL HIGH (ref 26.0–34.0)
MCHC: 32 g/dL (ref 30.0–36.0)
MCV: 110.1 fL — ABNORMAL HIGH (ref 80.0–100.0)
Monocytes Absolute: 0.3 10*3/uL (ref 0.1–1.0)
Monocytes Relative: 8 %
Neutro Abs: 1.7 10*3/uL (ref 1.7–7.7)
Neutrophils Relative %: 50 %
Platelet Count: 154 10*3/uL (ref 150–400)
RBC: 1.99 MIL/uL — ABNORMAL LOW (ref 3.87–5.11)
RDW: 16.6 % — ABNORMAL HIGH (ref 11.5–15.5)
WBC Count: 3.4 10*3/uL — ABNORMAL LOW (ref 4.0–10.5)
nRBC: 0 % (ref 0.0–0.2)

## 2020-02-14 LAB — SAMPLE TO BLOOD BANK

## 2020-02-14 LAB — PREPARE RBC (CROSSMATCH)

## 2020-02-14 MED ORDER — HEPARIN SOD (PORK) LOCK FLUSH 100 UNIT/ML IV SOLN
250.0000 [IU] | Freq: Once | INTRAVENOUS | Status: AC
  Administered 2020-02-14: 250 [IU]
  Filled 2020-02-14: qty 5

## 2020-02-14 MED ORDER — SODIUM CHLORIDE 0.9% FLUSH
10.0000 mL | Freq: Once | INTRAVENOUS | Status: AC
  Administered 2020-02-14: 10 mL
  Filled 2020-02-14: qty 10

## 2020-02-14 NOTE — Assessment & Plan Note (Addendum)
We had multiple goals of care discussion on and off over the past given her significant major comorbidities Her sister is her dedicated healthcare power of attorney and she agree with the plan of care as outlined above She will call me the next few days once she has made informed decisions about next step Overall, she is aware of poor prognosis

## 2020-02-14 NOTE — Assessment & Plan Note (Signed)
She has multifactorial pancytopenia, combination of vitamin B12 deficiency, recent side effects of bone marrow suppression from chemotherapy and ongoing consumption of platelets from bleeding She had received blood transfusion recently We discussed some of the risks, benefits, and alternatives of blood transfusions. The patient is symptomatic from anemia and the hemoglobin level is critically low.  Some of the side-effects to be expected including risks of transfusion reactions, chills, infection, syndrome of volume overload and risk of hospitalization from various reasons and the patient is willing to proceed and went ahead to sign consent today. She will return tomorrow for 2 units of blood

## 2020-02-14 NOTE — Progress Notes (Signed)
[place] OFFICE PROGRESS NOTE  Patient Care Team: [name], NP as PCP - General (Internal Medicine)  ASSESSMENT & PLAN:  Uterine cancer [facility]) Unfortunately, CT scan showed signs of disease progression Due to her baseline dementia, she is not able to make medical decisions Her sister is her MPOA We reviewed plans of either getting palliative radiation treatment, more systemic treatment, whether it is appropriate to pursue further blood transfusions or continue on lovenox She will go home and review with family and will let me know her final decisions    Pancytopenia, acquired [facility]) She has multifactorial pancytopenia, combination of vitamin B12 deficiency, recent side effects of bone marrow suppression from chemotherapy and ongoing consumption of platelets from bleeding She had received blood transfusion recently We discussed some of the risks, benefits, and alternatives of blood transfusions. The patient is symptomatic from anemia and the hemoglobin level is critically low.  Some of the side-effects to be expected including risks of transfusion reactions, chills, infection, syndrome of volume overload and risk of hospitalization from various reasons and the patient is willing to proceed and went ahead to sign consent today. She will return tomorrow for 2 units of blood  Pulmonary embolism ([place]) 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 Her CT imaging in December showed partial response to therapy; recent Ct from 2/22 showed persistent blood clots  I have a long discussion with the family about the risk and benefits of continuing Lovenox treatment versus discontinuation of Lovenox and risking recurrent DVT/PE Her sister is undecided and will talk to family about further plans of care  Goals of care, counseling/discussion We had multiple goals of care discussion on and off over the past given her significant major comorbidities Her sister  is her dedicated healthcare power of attorney and she agree with the plan of care as outlined above She will call me the next few days once she has made informed decisions about next step Overall, she is aware of poor prognosis   Orders Placed This Encounter  Procedures  . Practitioner attestation of consent    I, the ordering practitioner, attest that I have discussed with the patient the benefits, risks, side effects, alternatives, likelihood of achieving goals and potential problems during recovery for the procedure listed.    Standing Status:   Future    Standing Expiration Date:   02/13/2021    Order Specific Question:   Procedure    Answer:   Blood Product(s)  . Complete patient signature process for consent form    Standing Status:   Future    Standing Expiration Date:   02/13/2021  . Care order/instruction    Transfuse Parameters    Standing Status:   Future    Standing Expiration Date:   02/13/2021  . Type and screen    Standing Status:   Future    Standing Expiration Date:   02/13/2021    All questions were answered. The patient knows to call the clinic with any problems, questions or concerns. The total time spent in the appointment was 40 minutes encounter with patients including review of chart and various tests results, discussions about plan of care and coordination of care plan   Heath Lark, MD 02/14/2020 3:51 PM  INTERVAL HISTORY: Please see below for problem oriented charting. She returns with her sister for follow-up She continues to have heavy bleeding over the last few days but not as severe as last week Denies chest  pain or dizziness  SUMMARY OF ONCOLOGIC HISTORY: Oncology History Overview Note  Uterine cancer  40% ER positive MSI stable    Breast cancer of lower-inner quadrant of right female breast ([place])  05/23/2016 Mammogram   Diagnostic mammogram and ultrasound showed suspicious architectural distortion within the right breast lower inner quadrant,  measuring 1.3 cm, without sonographic corelate.   06/03/2016 Initial Biopsy   Right breast inferior lower quadrant core needle biopsy showed atypical ductal hyperplasia with calcifications   06/09/2016 Receptors her2   Breast biopsy showed ER 100% positive, PR 100% positive, HER-2 negative, Ki-67 40%   06/09/2016 Initial Diagnosis   Breast cancer of lower-inner quadrant of right female breast ([place])   06/09/2016 Initial Biopsy   Right breast inner quadrant core needle biopsy showed invasive ductal carcinoma and DCIS, grade 1-2   06/17/2016 Initial Biopsy   Right axillary lymph node core needle biopsy showed metastatic carcinoma   06/17/2016 Receptors her2   Axillary node biopsy showed ER 100% positive, PR 95% positive, HER-2 negative   06/19/2016 Imaging   Bilateral breast MRI showed locations in the lower inner right breast, largest 2.7X1.3X1.6cm, biopsy clips in 2 of this areas. There are abnormal right axillary lymph nodes showing second cortical's, no evidence of malignancy in the left breast.   07/29/2016 Surgery   Right lumpectomy and ALND   07/29/2016 Pathology Results   Right lumpectomy showed G3 IDC, DCIS, margins (-), LVI(-).  1 of 16 nodes was positive    07/29/2016 Miscellaneous   Mammaprint showed high risk disease, luminal type B   09/09/2016 - 11/12/2016 Adjuvant Chemotherapy   Docetaxel and Cytoxan (TC) every 3 weeks   12/24/2016 - 02/03/2017 Radiation Therapy   Adjuvant breast radiation 12/24/16 - 02/03/17 : Right Breast treated to 50.4 Gy in 28 fractions. Right Axilla treated to 45 Gy in 25 fractions.   02/17/2017 -  Anti-estrogen oral therapy   Letrozole 2.5 mg daily    03/23/2017 Imaging   DG Bone Density 03/23/17 ASSESSMENT: The BMD measured at Femur Neck Right is 0.809 g/cm2 with a T-score of -1.6. This patient is considered osteopenic according to [facility]) criteria.   09/08/2017 Mammogram   Diagnostic Mammogram 09/08/17  IMPRESSION: No  mammographic evidence of malignancy in either breast, status post right lumpectomy. RECOMMENDATION: Diagnostic mammogram is suggested in 1 year. (Code:[id number])   09/09/2018 Mammogram   09/10/2019 Mammogram IMPRESSION: No mammographic evidence of malignancy.   Uterine cancer ([place])  08/25/2019 Imaging   US pelvis Heterogeneous mass in the fundus of the uterus as well as a mass in the region of the cervix and vagina. These are felt to represent underlying neoplasm. Direct visualization is recommended as well as possible MRI of the pelvis for further delineation.   09/01/2019 Pathology Results   Endocervical biopsy - HIGH-GRADE ENDOMETRIOID ADENOCARCINOMA. SEE NOTE Immunohistochemical stains show that the tumor cells are diffusely positive for p16, p53 and vimentin. Tumor cells show focal staining for ER and are negative for monoclonal CEA. This immunophenotype is consistent with a gynecologic primary.  ER 40% positive   09/08/2019 Imaging   Ct scan of chest, abdomen and pelvis showed: 1. Acute bilateral pulmonary embolism with large saddle embolus. Dilated main pulmonary artery suggesting pulmonary hypertension. Patient will be brought immediately to the emergency department at [facility] from the lobby of the radiology department per instructions of Dr. Denman [place]. 2. Infiltrative poorly marginated heterogeneously enhancing 7.0 x 4.3 x 5.0 cm midline pelvic mass centered  in the region of the vaginal fornix with involvement of the uterine cervix and with intimate association with the anterior rectal wall, suspicious for malignancy of uncertain primary site. 3. Extensive lymphadenopathy throughout the mediastinum, retroperitoneum and bilateral pelvis, likely metastatic adenopathy. 4. Enlarged uterus with apparent 7.5 cm anterior uterine body solid mass, nonspecific, potentially large uterine fibroid. No adnexal masses. No ascites. 5. Cholelithiasis. Nonspecific gas in the fundal  gallbladder. Consider short-term follow-up CT abdomen to exclude emphysematous cholecystitis. No biliary ductal dilatation. 6. Marked colonic diverticulosis. 7.  Aortic Atherosclerosis (ICD10-I70.0).   09/08/2019 - 09/12/2019 Hospital Admission   She was admitted to the hospital for management of severe PE.  Her anticoagulation therapy was discontinued and switched to Lovenox   09/14/2019 Cancer Staging   Staging form: Corpus Uteri - Carcinoma and Carcinosarcoma, AJCC 8th Edition - Clinical stage from 09/14/2019: FIGO Stage IVB (cT3, cN2, pM1) - Signed by Heath Lark, MD on 09/14/2019   09/21/2019 Procedure   Successful left arm power PICC line placement with ultrasound and fluoroscopic guidance. The catheter is ready for use.   09/26/2019 -  Chemotherapy   The patient had carboplatin for chemotherapy treatment.     11/28/2019 Imaging   Ct chest, abdomen and pelvis 1. Interval decrease in size of a mass centered about the cervix, vaginal fornix, and closely abutting the superior rectum, consistent with treatment response. 2. Interval decrease in size of numerous enlarged mediastinal, retroperitoneal, iliac, and pelvic sidewall lymph nodes, findings consistent with treatment response of metastatic disease. 3. There is an enlarged left axillary lymph node measuring 2.6 x 2.0 cm (series 2, image 3), which appears to be new when compared to prior examination although imaging of the left axilla at that time was limited by patient arm in the down position and adjacent arthroplasty. There are newly enlarged left superior mediastinal/supraclavicular lymph nodes measuring up to 1.6 x 1.1 cm (series 2, image 4). Findings are concerning for mixed treatment response. Attention on follow-up. If possible, recommend performing all future examinations with patient arm in the elevated position to ensure adequate evaluation of the axilla. 4. There has been partial interval resolution of previously seen saddle pulmonary  embolus, now with generally more eccentric and bandlike subacute to chronic embolus present in the left and right pulmonary arteries as well as the lower lobe branch vessels (series 3, image 135, 170, 205). No new embolus noted. 5. Unchanged postoperative and post treatment appearance of the right breast. 6. Stable appearance of air within the gallbladder fundus or gallbladder wall, closely adjacent to a loop of distal small bowel, suggesting chronic enteric fistula. 7. 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 8. Aortic Atherosclerosis (ICD10-I70.0).     02/13/2020 Imaging   Ct chest, abdomen and pelvis 1. Enlarging and necrotic appearing mass arising from the cervix/vagina, with increasing mass effect on the rectum. Associated retroperitoneal and retrocrural adenopathy is unchanged. Minimal enlargement of a right paratracheal lymph node. Left axillary lymph node has decreased in size. 2. 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 3. Low-attenuation lesion in the inferior right hepatic lobe, indeterminate. Continued attention on follow-up exams is warranted. 4. Persistent saddle pulmonary embolus, as on the prior exam. 5. Hepatomegaly. 6. Cholelithiasis. 7. Aortic atherosclerosis (ICD10-I70.0). Coronary artery calcification. 8. Enlarged pulmonary arteries, indicative arterial hypertension.       REVIEW OF SYSTEMS:   Constitutional: Denies fevers, chills or abnormal weight loss Eyes:  Denies blurriness of vision Ears, nose, mouth, throat, and face: Denies mucositis or sore throat Respiratory: Denies cough, dyspnea or wheezes Cardiovascular: Denies palpitation, chest discomfort or lower extremity swelling Gastrointestinal:  Denies nausea, heartburn or change in bowel habits Skin: Denies abnormal skin rashes Lymphatics: Denies new lymphadenopathy or easy bruising Neurological:Denies numbness, tingling or  new weaknesses Behavioral/Psych: Mood is stable, no new changes  All other systems were reviewed with the patient and are negative.  I have reviewed the past medical history, past surgical history, social history and family history with the patient and they are unchanged from previous note.  ALLERGIES:  is allergic to other.  MEDICATIONS:  Current Outpatient Medications  Medication Sig Dispense Refill  . amLODipine (NORVASC) 5 MG tablet Take 1 tablet (5 mg total) by mouth daily. 30 tablet 0  . cetirizine (ZYRTEC) 10 MG tablet Take 10 mg by mouth every evening.     . Cholecalciferol (VITAMIN D-3) 125 MCG (5000 UT) TABS Take 5,000 Units by mouth daily.    . diphenoxylate-atropine (LOMOTIL) 2.5-0.025 MG tablet TAKE 1 TABLET BY MOUTH FOUR TIMES DAILY AS NEEDED FOR DIARRHEA OR LOOSE STOOLS 30 tablet 0  . enoxaparin (LOVENOX) 100 MG/ML injection Inject 1 mL (100 mg total) into the skin every 12 (twelve) hours. 60 mL 11  . exemestane (AROMASIN) 25 MG tablet TAKE 1 TABLET BY MOUTH DAILY 30 tablet 6  . FERROCITE 324 MG TABS tablet Take 1 tablet by mouth daily.    . ferrous sulfate 325 (65 FE) MG tablet Take 325 mg by mouth daily with breakfast.    . lisinopril (ZESTRIL) 20 MG tablet Take 1 tablet (20 mg total) by mouth daily. 90 tablet 3  . loperamide (IMODIUM A-D) 2 MG tablet Take 2 mg by mouth 4 (four) times daily as needed.    . memantine (NAMENDA) 10 MG tablet TAKE ONE TABLET BY MOUTH TWICE DAILY (Patient taking differently: Take 10 mg by mouth 2 (two) times daily. ) 60 tablet 5  . nitrofurantoin (MACRODANTIN) 100 MG capsule Take 100 mg by mouth 2 (two) times daily.    . ondansetron (ZOFRAN) 8 MG tablet Take 1 tablet (8 mg total) by mouth every 8 (eight) hours as needed. 30 tablet 1  . Probiotic Product (PROBIOTIC PO) Take 1 capsule by mouth daily.     . prochlorperazine (COMPAZINE) 10 MG tablet Take 1 tablet (10 mg total) by mouth every 6 (six) hours as needed (Nausea or vomiting). 30 tablet 1   . rosuvastatin (CRESTOR) 10 MG tablet TAKE ONE TABLET EVERY DAY 30 tablet 2  . sertraline (ZOLOFT) 100 MG tablet Take 1 tablet (100 mg total) by mouth daily. 90 tablet 3  . Sodium Chloride Flush (NORMAL SALINE FLUSH) 0.9 % SOLN Inject 10 ml daily to each lumen, double lumen PICC so please dispense 60 prefilled syringes 600 mL 0   No current facility-administered medications for this visit.    PHYSICAL EXAMINATION: ECOG PERFORMANCE STATUS: 2 - Symptomatic, <50% confined to bed  Vitals:   02/14/20 1446  BP: 134/66  Pulse: 71  Resp: 18  Temp: 98.3 F (36.8 C)  SpO2: 100%   Filed Weights   02/14/20 1446  Weight: 223 lb 9.6 oz (101.4 kg)    GENERAL:alert, no distress and comfortable NEURO: alert & oriented x 3 with fluent speech, no focal motor/sensory deficits  LABORATORY DATA:  I have reviewed the data as listed    Component Value Date/Time   NA 139 02/14/2020  1410   NA 140 09/22/2017 1003   K 4.1 02/14/2020 1410   K 3.8 09/22/2017 1003   CL 103 02/14/2020 1410   CO2 30 02/14/2020 1410   CO2 29 09/22/2017 1003   GLUCOSE 134 (H) 02/14/2020 1410   GLUCOSE 116 09/22/2017 1003   BUN 21 02/14/2020 1410   BUN 22.4 09/22/2017 1003   CREATININE 0.69 02/14/2020 1410   CREATININE 0.71 07/02/2018 1514   CREATININE 0.8 09/22/2017 1003   CALCIUM 9.2 02/14/2020 1410   CALCIUM 10.0 09/22/2017 1003   PROT 6.5 02/14/2020 1410   PROT 7.1 09/22/2017 1003   ALBUMIN 3.0 (L) 02/14/2020 1410   ALBUMIN 3.1 (L) 09/22/2017 1003   AST 10 (L) 02/14/2020 1410   AST 14 09/22/2017 1003   ALT 6 02/14/2020 1410   ALT 9 09/22/2017 1003   ALKPHOS 82 02/14/2020 1410   ALKPHOS 126 09/22/2017 1003   BILITOT <0.2 (L) 02/14/2020 1410   BILITOT 0.24 09/22/2017 1003   GFRNONAA >60 02/14/2020 1410   GFRAA >60 02/14/2020 1410    No results found for: SPEP, UPEP  Lab Results  Component Value Date   WBC 3.4 (L) 02/14/2020   NEUTROABS 1.7 02/14/2020   HGB 7.0 (L) 02/14/2020   HCT 21.9 (L)  02/14/2020   MCV 110.1 (H) 02/14/2020   PLT 154 02/14/2020      Chemistry      Component Value Date/Time   NA 139 02/14/2020 1410   NA 140 09/22/2017 1003   K 4.1 02/14/2020 1410   K 3.8 09/22/2017 1003   CL 103 02/14/2020 1410   CO2 30 02/14/2020 1410   CO2 29 09/22/2017 1003   BUN 21 02/14/2020 1410   BUN 22.4 09/22/2017 1003   CREATININE 0.69 02/14/2020 1410   CREATININE 0.71 07/02/2018 1514   CREATININE 0.8 09/22/2017 1003      Component Value Date/Time   CALCIUM 9.2 02/14/2020 1410   CALCIUM 10.0 09/22/2017 1003   ALKPHOS 82 02/14/2020 1410   ALKPHOS 126 09/22/2017 1003   AST 10 (L) 02/14/2020 1410   AST 14 09/22/2017 1003   ALT 6 02/14/2020 1410   ALT 9 09/22/2017 1003   BILITOT <0.2 (L) 02/14/2020 1410   BILITOT 0.24 09/22/2017 1003       RADIOGRAPHIC STUDIES:I reviewed multiple imaging studies with patient and her sister I have personally reviewed the radiological images as listed and agreed with the findings in the report. CT CHEST W CONTRAST  Result Date: 02/13/2020 CLINICAL DATA:  Uterine/cervical cancer, assess treatment response. Ongoing chemotherapy. Metastatic breast cancer with chemotherapy and radiation therapy complete. Vaginal bleeding. EXAM: CT CHEST, ABDOMEN, AND PELVIS WITH CONTRAST TECHNIQUE: Multidetector CT imaging of the chest, abdomen and pelvis was performed following the standard protocol during bolus administration of intravenous contrast. CONTRAST:  157m OMNIPAQUE IOHEXOL 300 MG/ML  SOLN COMPARISON:  11/28/2019 and 09/08/2019. FINDINGS: CT CHEST FINDINGS Cardiovascular: Right PICC terminates in the low right atrium, near the inferior vena cava. A filling defect at the bifurcation of the right and left pulmonary arteries is again seen, similar to 11/28/2019. Right and left pulmonary arteries are enlarged. Atherosclerotic calcification of the aorta, aortic valve and coronary arteries. Heart is at the upper limits of normal in size mildly enlarged.  No pericardial effusion. Mediastinum/Nodes: Low internal jugular lymph nodes measure up to 11 mm on the left (2/8), similar. A 1.4 cm right paratracheal lymph node (2/15) has enlarged slightly from 12 mm on 11/28/2019. Other mediastinal lymph nodes  are stable, with the largest lymph node in the low right paratracheal station measuring 1.9 cm (2/19). No hilar or axillary adenopathy. Specifically, a previously enlarged left axillary lymph node has decreased in size, now measuring 9 mm (2/13), compared to 2.0 cm on 11/28/2019. Surgical clips in the right axilla. Esophagus is unremarkable. Lungs/Pleura: Image quality is degraded by expiratory phase imaging, creating mosaic attenuation in the lungs. Further assessment is limited without dedicated inspiratory and expiratory imaging. Lungs are otherwise clear. No pleural fluid. Airway is otherwise unremarkable. Musculoskeletal: Advanced degenerative changes in the spine. Left shoulder arthroplasty. Old right anterior rib fracture. CT ABDOMEN PELVIS FINDINGS Hepatobiliary: Low-attenuation lesion in the inferior right hepatic lobe measures 11 mm (2/72), similar but too small to characterize. Lesion may be new from 09/08/2019. Liver is enlarged, measuring 21.5 cm. Left hepatic lobe atrophy. 2.5 cm stone in the gallbladder. Air in or near the gallbladder, seen on 11/28/2019, is not readily appreciated. No biliary ductal dilatation. Pancreas: A few calcifications are seen in the pancreas indicative of chronic calcific pancreatitis. Otherwise unremarkable. Spleen: Negative. Adrenals/Urinary Tract: Adrenal glands are unremarkable. Probable renal sinus cysts bilaterally. Low-attenuation lesions kidneys measure up to 1.3 cm on the left, similar but too small to characterize. Ureters are decompressed. Bladder is low in volume. Stomach/Bowel: Stomach is unremarkable. Duodenal diverticula are incidentally noted. Small bowel, appendix and majority of the colon are otherwise  unremarkable. An exophytic low-attenuation mass arising from the lower uterine segment/cervix exerts mass effect on the rectum (2/101), increased from 11/28/2019 but similar to 09/08/2019. Vascular/Lymphatic: Atherosclerotic calcification of the aorta without aneurysm. Inferior vena cava filter is in place. A stent is seen in the right common iliac vein. Retrocrural and abdominopelvic retroperitoneal lymph nodes are stable in size from 11/28/2019. Index right retrocrural lymph node measures 9 mm (2/49). Index left periaortic lymph node measures 12 mm (2/66). Index right common iliac lymph node measures 13 mm (2/80). Index left common iliac lymph node measures 14 mm (2/83). Index right external iliac lymph node measures 14 mm (2/95). Finally, index left external iliac lymph node measures 10 mm (2/90). Reproductive: Probable 6.3 cm fibroid in the uterus. Cervical/vaginal mass appears minimally larger, now measuring approximately 4.7 x 7.2 cm (2/101), previously 4.5 x 6.1 cm. There is increasing mass effect on the rectum. No adnexal mass. Other: Numerous subcutaneous soft tissue injection lesions and subcutaneous air along the ventral abdominal pelvic wall. No free fluid. Mesenteries and peritoneum are unremarkable. Musculoskeletal: A mixed lytic and sclerotic lesion in the left femoral head measures approximately 2.3 x 3.9 cm (2/103). Lesion is largely new from 09/08/2019. S-shaped scoliosis. Advanced degenerative changes and endplate irregularity at L3-4, similar and possibly due to previous infection. IMPRESSION: 1. Enlarging and necrotic appearing mass arising from the cervix/vagina, with increasing mass effect on the rectum. Associated retroperitoneal and retrocrural adenopathy is unchanged. Minimal enlargement of a right paratracheal lymph node. Left axillary lymph node has decreased in size. 2. 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 3.  Low-attenuation lesion in the inferior right hepatic lobe, indeterminate. Continued attention on follow-up exams is warranted. 4. Persistent saddle pulmonary embolus, as on the prior exam. 5. Hepatomegaly. 6. Cholelithiasis. 7. Aortic atherosclerosis (ICD10-I70.0). Coronary artery calcification. 8. Enlarged pulmonary arteries, indicative arterial hypertension. Electronically Signed   By: Lorin Picket M.D.   On: 02/13/2020 10:46   CT ABDOMEN PELVIS W CONTRAST  Result Date: 02/13/2020 CLINICAL DATA:  Uterine/cervical  cancer, assess treatment response. Ongoing chemotherapy. Metastatic breast cancer with chemotherapy and radiation therapy complete. Vaginal bleeding. EXAM: CT CHEST, ABDOMEN, AND PELVIS WITH CONTRAST TECHNIQUE: Multidetector CT imaging of the chest, abdomen and pelvis was performed following the standard protocol during bolus administration of intravenous contrast. CONTRAST:  123m OMNIPAQUE IOHEXOL 300 MG/ML  SOLN COMPARISON:  11/28/2019 and 09/08/2019. FINDINGS: CT CHEST FINDINGS Cardiovascular: Right PICC terminates in the low right atrium, near the inferior vena cava. A filling defect at the bifurcation of the right and left pulmonary arteries is again seen, similar to 11/28/2019. Right and left pulmonary arteries are enlarged. Atherosclerotic calcification of the aorta, aortic valve and coronary arteries. Heart is at the upper limits of normal in size mildly enlarged. No pericardial effusion. Mediastinum/Nodes: Low internal jugular lymph nodes measure up to 11 mm on the left (2/8), similar. A 1.4 cm right paratracheal lymph node (2/15) has enlarged slightly from 12 mm on 11/28/2019. Other mediastinal lymph nodes are stable, with the largest lymph node in the low right paratracheal station measuring 1.9 cm (2/19). No hilar or axillary adenopathy. Specifically, a previously enlarged left axillary lymph node has decreased in size, now measuring 9 mm (2/13), compared to 2.0 cm on 11/28/2019.  Surgical clips in the right axilla. Esophagus is unremarkable. Lungs/Pleura: Image quality is degraded by expiratory phase imaging, creating mosaic attenuation in the lungs. Further assessment is limited without dedicated inspiratory and expiratory imaging. Lungs are otherwise clear. No pleural fluid. Airway is otherwise unremarkable. Musculoskeletal: Advanced degenerative changes in the spine. Left shoulder arthroplasty. Old right anterior rib fracture. CT ABDOMEN PELVIS FINDINGS Hepatobiliary: Low-attenuation lesion in the inferior right hepatic lobe measures 11 mm (2/72), similar but too small to characterize. Lesion may be new from 09/08/2019. Liver is enlarged, measuring 21.5 cm. Left hepatic lobe atrophy. 2.5 cm stone in the gallbladder. Air in or near the gallbladder, seen on 11/28/2019, is not readily appreciated. No biliary ductal dilatation. Pancreas: A few calcifications are seen in the pancreas indicative of chronic calcific pancreatitis. Otherwise unremarkable. Spleen: Negative. Adrenals/Urinary Tract: Adrenal glands are unremarkable. Probable renal sinus cysts bilaterally. Low-attenuation lesions kidneys measure up to 1.3 cm on the left, similar but too small to characterize. Ureters are decompressed. Bladder is low in volume. Stomach/Bowel: Stomach is unremarkable. Duodenal diverticula are incidentally noted. Small bowel, appendix and majority of the colon are otherwise unremarkable. An exophytic low-attenuation mass arising from the lower uterine segment/cervix exerts mass effect on the rectum (2/101), increased from 11/28/2019 but similar to 09/08/2019. Vascular/Lymphatic: Atherosclerotic calcification of the aorta without aneurysm. Inferior vena cava filter is in place. A stent is seen in the right common iliac vein. Retrocrural and abdominopelvic retroperitoneal lymph nodes are stable in size from 11/28/2019. Index right retrocrural lymph node measures 9 mm (2/49). Index left periaortic lymph node  measures 12 mm (2/66). Index right common iliac lymph node measures 13 mm (2/80). Index left common iliac lymph node measures 14 mm (2/83). Index right external iliac lymph node measures 14 mm (2/95). Finally, index left external iliac lymph node measures 10 mm (2/90). Reproductive: Probable 6.3 cm fibroid in the uterus. Cervical/vaginal mass appears minimally larger, now measuring approximately 4.7 x 7.2 cm (2/101), previously 4.5 x 6.1 cm. There is increasing mass effect on the rectum. No adnexal mass. Other: Numerous subcutaneous soft tissue injection lesions and subcutaneous air along the ventral abdominal pelvic wall. No free fluid. Mesenteries and peritoneum are unremarkable. Musculoskeletal: A mixed lytic and sclerotic lesion in the left  femoral head measures approximately 2.3 x 3.9 cm (2/103). Lesion is largely new from 09/08/2019. S-shaped scoliosis. Advanced degenerative changes and endplate irregularity at L3-4, similar and possibly due to previous infection. IMPRESSION: 1. Enlarging and necrotic appearing mass arising from the cervix/vagina, with increasing mass effect on the rectum. Associated retroperitoneal and retrocrural adenopathy is unchanged. Minimal enlargement of a right paratracheal lymph node. Left axillary lymph node has decreased in size. 2. 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 3. Low-attenuation lesion in the inferior right hepatic lobe, indeterminate. Continued attention on follow-up exams is warranted. 4. Persistent saddle pulmonary embolus, as on the prior exam. 5. Hepatomegaly. 6. Cholelithiasis. 7. Aortic atherosclerosis (ICD10-I70.0). Coronary artery calcification. 8. Enlarged pulmonary arteries, indicative arterial hypertension. Electronically Signed   By: Lorin Picket M.D.   On: 02/13/2020 10:46

## 2020-02-14 NOTE — Progress Notes (Signed)
Pt notified of 2 units of PRBC's for 2/24 @ 730am.  RN confirmed with blood bank that 2 units are ordered for 2/24.

## 2020-02-14 NOTE — Assessment & Plan Note (Signed)
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 Her CT imaging in December showed partial response to therapy; recent Ct from 2/22 showed persistent blood clots  I have a long discussion with the family about the risk and benefits of continuing Lovenox treatment versus discontinuation of Lovenox and risking recurrent DVT/PE Her sister is undecided and will talk to family about further plans of care

## 2020-02-14 NOTE — Assessment & Plan Note (Addendum)
Unfortunately, CT scan showed signs of disease progression Due to her baseline dementia, she is not able to make medical decisions Her sister is her MPOA We reviewed plans of either getting palliative radiation treatment, more systemic treatment, whether it is appropriate to pursue further blood transfusions or continue on lovenox She will go home and review with family and will let me know her final decisions

## 2020-02-14 NOTE — Patient Instructions (Signed)
PICC Home Care Guide  A peripherally inserted central catheter (PICC) is a form of IV access that allows medicines and IV fluids to be quickly distributed throughout the body. The PICC is a long, thin, flexible tube (catheter) that is inserted into a vein in the upper arm. The catheter ends in a large vein in the chest (superior vena cava, or SVC). After the PICC is inserted, a chest X-ray may be done to make sure that it is in the correct place. A PICC may be placed for different reasons, such as:  To give medicines and liquid nutrition.  To give IV fluids and blood products.  If there is trouble placing a peripheral intravenous (PIV) catheter. If taken care of properly, a PICC can remain in place for several months. Having a PICC can also allow a person to go home from the hospital sooner. Medicine and PICC care can be managed at home by a family member, caregiver, or home health care team. What are the risks? Generally, having a PICC is safe. However, problems may occur, including:  A blood clot (thrombus) forming in or at the tip of the PICC.  A blood clot forming in a vein (deep vein thrombosis) or traveling to the lung (pulmonary embolism).  Inflammation of the vein (phlebitis) in which the PICC is placed.  Infection. Central line associated blood stream infection (CLABSI) is a serious infection that often requires hospitalization.  PICC movement (malposition). The PICC tip may move from its original position due to excessive physical activity, forceful coughing, sneezing, or vomiting.  A break or cut in the PICC. It is important not to use scissors near the PICC.  Nerve or tendon irritation or injury during PICC insertion. How to take care of your PICC Preventing problems  You and any caregivers should wash your hands often with soap. Wash hands: ? Before touching the PICC line or the infusion device. ? Before changing a bandage (dressing).  Flush the PICC as told by your  health care provider. Let your health care provider know right away if the PICC is hard to flush or does not flush. Do not use force to flush the PICC.  Do not use a syringe that is less than 10 mL to flush the PICC.  Avoid blood pressure checks on the arm in which the PICC is placed.  Never pull or tug on the PICC.  Do not take the PICC out yourself. Only a trained clinical professional should remove the PICC.  Use clean and sterile supplies only. Keep the supplies in a dry place. Do not reuse needles, syringes, or any other supplies. Doing that can lead to infection.  Keep pets and children away from your PICC line.  Check the PICC insertion site every day for signs of infection. Check for: ? Leakage. ? Redness, swelling, or pain. ? Fluid or blood. ? Warmth. ? Pus or a bad smell. PICC dressing care  Keep your PICC bandage (dressing) clean and dry to prevent infection.  Do not take baths, swim, or use a hot tub until your health care provider approves. Ask your health care provider if you can take showers. You may only be allowed to take sponge baths for bathing. When you are allowed to shower: ? Ask your health care provider to teach you how to wrap the PICC line. ? Cover the PICC line with clear plastic wrap and tape to keep it dry while showering.  Follow instructions from your health care provider   about how to take care of your insertion site and dressing. Make sure you: ? Wash your hands with soap and water before you change your bandage (dressing). If soap and water are not available, use hand sanitizer. ? Change your dressing as told by your health care provider. ? Leave stitches (sutures), skin glue, or adhesive strips in place. These skin closures may need to stay in place for 2 weeks or longer. If adhesive strip edges start to loosen and curl up, you may trim the loose edges. Do not remove adhesive strips completely unless your health care provider tells you to do  that.  Change your PICC dressing if it becomes loose or wet. General instructions   Carry your PICC identification card or wear a medical alert bracelet at all times.  Keep the tube clamped at all times, unless it is being used.  Carry a smooth-edge clamp with you at all times to place on the tube if it breaks.  Do not use scissors or sharp objects near the tube.  You may bend your arm and move it freely. If your PICC is near or at the bend of your elbow, avoid activity with repeated motion at the elbow.  Avoid lifting heavy objects as told by your health care provider.  Keep all follow-up visits as told by your health care provider. This is important. Disposal of supplies  Throw away any syringes in a disposal container that is meant for sharp items (sharps container). You can buy a sharps container from a pharmacy, or you can make one by using an empty hard plastic bottle with a cover.  Place any used dressings or infusion bags into a plastic bag. Throw that bag in the trash. Contact a health care provider if:  You have pain in your arm, ear, face, or teeth.  You have a fever or chills.  You have redness, swelling, or pain around the insertion site.  You have fluid or blood coming from the insertion site.  Your insertion site feels warm to the touch.  You have pus or a bad smell coming from the insertion site.  Your skin feels hard and raised around the insertion site. Get help right away if:  Your PICC is accidentally pulled all the way out. If this happens, cover the insertion site with a bandage or gauze dressing. Do not throw the PICC away. Your health care provider will need to check it.  Your PICC was tugged or pulled and has partially come out. Do not  push the PICC back in.  You cannot flush the PICC, it is hard to flush, or the PICC leaks around the insertion site when it is flushed.  You hear a "flushing" sound when the PICC is flushed.  You feel your  heart racing or skipping beats.  There is a hole or tear in the PICC.  You have swelling in the arm in which the PICC was inserted.  You have a red streak going up your arm from where the PICC was inserted. Summary  A peripherally inserted central catheter (PICC) is a long, thin, flexible tube (catheter) that is inserted into a vein in the upper arm.  The PICC is inserted using a sterile technique by a specially trained nurse or physician. Only a trained clinical professional should remove it.  Keep your PICC identification card with you at all times.  Avoid blood pressure checks on the arm in which the PICC is placed.  If cared for   properly, a PICC can remain in place for several months. Having a PICC can also allow a person to go home from the hospital sooner. This information is not intended to replace advice given to you by your health care provider. Make sure you discuss any questions you have with your health care provider. Document Revised: 11/20/2017 Document Reviewed: 01/10/2017 Elsevier Patient Education  2020 Elsevier Inc.  

## 2020-02-15 ENCOUNTER — Other Ambulatory Visit: Payer: Self-pay

## 2020-02-15 ENCOUNTER — Telehealth: Payer: Self-pay | Admitting: Oncology

## 2020-02-15 ENCOUNTER — Inpatient Hospital Stay: Payer: Medicare PPO

## 2020-02-15 DIAGNOSIS — C55 Malignant neoplasm of uterus, part unspecified: Secondary | ICD-10-CM

## 2020-02-15 DIAGNOSIS — D539 Nutritional anemia, unspecified: Secondary | ICD-10-CM

## 2020-02-15 DIAGNOSIS — Z5111 Encounter for antineoplastic chemotherapy: Secondary | ICD-10-CM | POA: Diagnosis not present

## 2020-02-15 LAB — PREPARE RBC (CROSSMATCH)

## 2020-02-15 MED ORDER — SODIUM CHLORIDE 0.9% IV SOLUTION
250.0000 mL | Freq: Once | INTRAVENOUS | Status: AC
  Administered 2020-02-15: 08:00:00 250 mL via INTRAVENOUS
  Filled 2020-02-15: qty 250

## 2020-02-15 MED ORDER — DIPHENHYDRAMINE HCL 25 MG PO CAPS
25.0000 mg | ORAL_CAPSULE | Freq: Once | ORAL | Status: AC
  Administered 2020-02-15: 08:00:00 25 mg via ORAL

## 2020-02-15 MED ORDER — ACETAMINOPHEN 325 MG PO TABS
650.0000 mg | ORAL_TABLET | Freq: Once | ORAL | Status: AC
  Administered 2020-02-15: 650 mg via ORAL

## 2020-02-15 MED ORDER — HEPARIN SOD (PORK) LOCK FLUSH 100 UNIT/ML IV SOLN
250.0000 [IU] | INTRAVENOUS | Status: AC | PRN
  Administered 2020-02-15: 12:00:00 250 [IU]
  Filled 2020-02-15: qty 5

## 2020-02-15 MED ORDER — ACETAMINOPHEN 325 MG PO TABS
ORAL_TABLET | ORAL | Status: AC
  Filled 2020-02-15: qty 2

## 2020-02-15 MED ORDER — SODIUM CHLORIDE 0.9% FLUSH
10.0000 mL | INTRAVENOUS | Status: AC | PRN
  Administered 2020-02-15: 12:00:00 10 mL
  Filled 2020-02-15: qty 10

## 2020-02-15 MED ORDER — DIPHENHYDRAMINE HCL 25 MG PO CAPS
ORAL_CAPSULE | ORAL | Status: AC
  Filled 2020-02-15: qty 1

## 2020-02-15 NOTE — Patient Instructions (Signed)

## 2020-02-15 NOTE — Telephone Encounter (Signed)
[name] called and said she spoke to her niece and son regarding Tiffany Arnold's treatment.  They have decided to discontinue chemotherapy and do not want any more radiation due to quality of life issues.  She said they want to discontinue what is not working.    She is not sure about continuing Lovenox and is wondering what "maintenance care" will be needed going forward.    She also asked about Hospice and if they need a referral immediately.  She is concerned about "rough times" that may come up.  Advised her that we usually will go forward with the hospice referral so that it is in place.  Told her that I will send Dr. Lottie Rater a message with their wishes and will call back tomorrow.

## 2020-02-16 LAB — BPAM RBC
Blood Product Expiration Date: 202103232359
Blood Product Expiration Date: 202103232359
ISSUE DATE / TIME: 202102240809
ISSUE DATE / TIME: 202102240809
Unit Type and Rh: 6200
Unit Type and Rh: 6200

## 2020-02-16 LAB — TYPE AND SCREEN
ABO/RH(D): A POS
Antibody Screen: NEGATIVE
Unit division: 0
Unit division: 0

## 2020-02-16 NOTE — Telephone Encounter (Signed)
If her family is willing to move forward with palliative care only, we can get her PICC line removed tomorrow Please send referral to [facility] hospice/regional hospice care team near [place]; the will follow the patient and family I suggest stopping lovenox due to ongoing bleeding

## 2020-02-16 NOTE — Telephone Encounter (Signed)
Called Tiffany Arnold and advised her of message below from Dr. Alvy Bimler.  She would like to talk to her daughter tonight about everything and will call back tomorrow with their decision.

## 2020-02-17 NOTE — Telephone Encounter (Signed)
Tiffany Arnold called back. She has talked with other family members and is still undecided. She is concerned about Tiffany Arnold coming off the Lovenox. Could the Lovenox be reduced to once daily? Could she take a pill for the blood clots? Offered appt with Dr. Alvy Bimler to discuss. Tiffany Arnold declined appt offer, she does not want to make Tiffany Arnold come to the office. Tiffany Arnold can be called on cell if needed. Told her I would call her back Monday. She verbalized understanding.

## 2020-02-19 DIAGNOSIS — Z4789 Encounter for other orthopedic aftercare: Secondary | ICD-10-CM | POA: Diagnosis not present

## 2020-02-19 DIAGNOSIS — S42202A Unspecified fracture of upper end of left humerus, initial encounter for closed fracture: Secondary | ICD-10-CM | POA: Diagnosis not present

## 2020-02-20 ENCOUNTER — Telehealth: Payer: Self-pay | Admitting: Hematology and Oncology

## 2020-02-20 ENCOUNTER — Telehealth: Payer: Self-pay | Admitting: Internal Medicine

## 2020-02-20 ENCOUNTER — Other Ambulatory Visit: Payer: Self-pay | Admitting: Hematology and Oncology

## 2020-02-20 ENCOUNTER — Other Ambulatory Visit: Payer: Self-pay

## 2020-02-20 ENCOUNTER — Inpatient Hospital Stay: Payer: Medicare PPO | Attending: Hematology

## 2020-02-20 DIAGNOSIS — C55 Malignant neoplasm of uterus, part unspecified: Secondary | ICD-10-CM

## 2020-02-20 NOTE — Telephone Encounter (Signed)
I called her sister I have cancelled all her appt Can you call infusion room to see if we can get PICC line removed today or Wednesday? Please call her sister directly for the schedule

## 2020-02-20 NOTE — Telephone Encounter (Signed)
[facility] [place] called stating pt is stopping cancer treatment and has requested hospice.  kristy wanted to know if regina agrees with hopsice and would be pt hospice attending provider

## 2020-02-20 NOTE — Patient Instructions (Signed)
PICC Removal, Adult, Care After This sheet gives you information about how to care for yourself after your procedure. Your health care provider may also give you more specific instructions. If you have problems or questions, contact your health care provider. What can I expect after the procedure? After your procedure, it is common to have:  Tenderness or soreness.  Redness, swelling, or a scab where the PICC was removed (exit site). Follow these instructions at home: For the first 24 hours after the procedure   Keep the bandage (dressing) on the exit site clean and dry. Do not remove the dressing until your health care provider tells you to do so.  Check your arm often for signs and symptoms of an infection. Check for: ? A red streak that spreads away from the dressing. ? Blood or fluid that you can see on the dressing. ? More redness or swelling.  Do not lift anything heavy or do activities that require great effort until your health care provider says it is okay. You should avoid: ? Lifting weights. ? Yard work. ? Any physical activity with repetitive arm movement.  Watch closely for any signs of an air bubble in the vein (air embolism). This is a rare but serious complication. If you have signs of air embolism, call 911 immediately and lie down on your left side to keep the air from moving into the lungs. Signs of an air embolism include: ? Difficulty breathing. ? Chest pain. ? Coughing or wheezing. ? Skin that is pale, blue, cold, or clammy. ? Rapid pulse. ? Rapid breathing. ? Fainting. After 24 Hours have passed:  Remove your dressing as told by your health care provider. Make sure you wash your hands with soap and water before and after you change the dressing. If soap and water are not available, use hand sanitizer.  Return to your normal activities as told by your health care provider.  A small scab may develop over the exit site. Do not pick at the scab.  When  bathing or showering, gently wash the exit site with soap and water. Pat it dry.  Watch for signs of infection, such as: ? Fever or chills. ? Swollen glands under the arm. ? More redness, swelling, or soreness in the arm. ? Blood, fluid, or pus coming from the exit site. ? Warmth or a bad smell at the exit site. ? A red streak spreading away from the exit site. General instructions  Take over-the-counter and prescription medicines only as told by your health care provider. Do not take any new medicines without checking with your health care provider first.  If you were prescribed an antibiotic medicine, apply or take it as told by your health care provider. Do not stop using the antibiotic even if your condition improves.  Keep all follow-up visits as told by your health care provider. This is important. Contact a health care provider if:  You have a fever or chills.  You have soreness, redness, or swelling on your exit site, and it gets worse.  You have swollen glands under your arm.  You have any of the following symptoms at your exit site: ? Blood, fluid, or pus. ? Unusual warmth. ? A bad smell. ? A red streak spreading away from the exit site. Get help right away if:  You have numbness or tingling in your fingers, hand, or arm.  Your arm looks blue and feels cold.  You have signs of an air embolism, such   as: ? Difficulty breathing. ? Chest pain. ? Coughing or wheezing. ? Skin that is pale, blue, cold, or clammy. ? Rapid pulse. ? Rapid breathing. ? Fainting. These symptoms may represent a serious problem that is an emergency. Do not wait to see if the symptoms will go away. Get medical help right away. Call your local emergency services (911 in the [place] Do not drive yourself to the hospital. Summary  After your procedure, it is common to have tenderness or soreness, redness, swelling, or a scab at the exit site.  Keep the dressing over the exit site clean and dry.  Do not remove the dressing until your health care provider tells you to do so.  Do not lift anything heavy or do activities that require great effort until your health care provider says it is okay.  Watch closely for any signs of an air embolism. If you have signs of air embolism, call 911 immediately and lie down on your left side. This information is not intended to replace advice given to you by your health care provider. Make sure you discuss any questions you have with your health care provider. Document Revised: 11/20/2017 Document Reviewed: 02/03/2017 Elsevier Patient Education  2020 Elsevier Inc.  

## 2020-02-20 NOTE — Telephone Encounter (Signed)
Called and given 3 pm appt to sister, [name], for PICC removal. She verbalized understanding. Instructed to call the office for questions.  [place] with referral. They do not go to [place] area. Referral called to [facility] in [place] for hospice.

## 2020-02-20 NOTE — Telephone Encounter (Signed)
I spoke with her sister briefly about the risk and benefits of continuing Lovenox versus discontinuation I recommend PICC line removal All her appointment is canceled I have addressed all her questions

## 2020-02-21 ENCOUNTER — Ambulatory Visit: Payer: Medicare PPO | Admitting: Hematology and Oncology

## 2020-02-21 ENCOUNTER — Inpatient Hospital Stay: Payer: Medicare PPO

## 2020-02-21 ENCOUNTER — Ambulatory Visit: Payer: Medicare PPO

## 2020-02-21 ENCOUNTER — Other Ambulatory Visit: Payer: Medicare PPO

## 2020-02-21 NOTE — Telephone Encounter (Signed)
[place] for hospice, yes I will continue to see her. Do they need an order in Epic for hospice or is VO enough?

## 2020-02-21 NOTE — Telephone Encounter (Signed)
Left message on voicemail.

## 2020-02-22 ENCOUNTER — Other Ambulatory Visit: Payer: Self-pay | Admitting: Internal Medicine

## 2020-02-22 NOTE — Telephone Encounter (Signed)
Last filled 12/28/2019... please advise

## 2020-02-28 ENCOUNTER — Other Ambulatory Visit: Payer: Medicare PPO

## 2020-03-05 ENCOUNTER — Other Ambulatory Visit: Payer: Medicare PPO

## 2020-03-06 ENCOUNTER — Other Ambulatory Visit: Payer: Medicare PPO

## 2020-03-06 ENCOUNTER — Ambulatory Visit: Payer: Medicare PPO

## 2020-03-06 ENCOUNTER — Ambulatory Visit: Payer: Medicare PPO | Admitting: Hematology and Oncology

## 2020-03-14 ENCOUNTER — Other Ambulatory Visit: Payer: Self-pay | Admitting: Hematology and Oncology

## 2020-03-31 ENCOUNTER — Other Ambulatory Visit: Payer: Self-pay | Admitting: Internal Medicine

## 2020-04-03 ENCOUNTER — Encounter: Payer: Self-pay | Admitting: Internal Medicine

## 2020-04-06 ENCOUNTER — Telehealth: Payer: Self-pay

## 2020-04-06 NOTE — Telephone Encounter (Signed)
Tiffany Arnold with [facility] left v/m wanting to make sure [name] NP got urine results on 04/05/20. Tiffany Arnold wanted to make sure pt received treatment before the weekend for UTI. Tiffany Arnold request cb. [facility].

## 2020-04-06 NOTE — Telephone Encounter (Signed)
Left message on voicemail with instructions for pt to push fluids no evidence of UTI

## 2020-04-06 NOTE — Telephone Encounter (Signed)
She does not need treatment. She does not have a UTI.

## 2020-04-10 ENCOUNTER — Telehealth: Payer: Self-pay

## 2020-04-13 ENCOUNTER — Other Ambulatory Visit: Payer: Self-pay | Admitting: Internal Medicine

## 2020-04-13 NOTE — Telephone Encounter (Signed)
error 

## 2020-04-17 ENCOUNTER — Other Ambulatory Visit: Payer: Self-pay | Admitting: Internal Medicine

## 2020-04-17 NOTE — Telephone Encounter (Signed)
Last filled 02/22/2020...Marland Kitchen please advise

## 2020-05-02 ENCOUNTER — Telehealth: Payer: Self-pay | Admitting: Physician Assistant

## 2020-05-02 NOTE — Telephone Encounter (Signed)
Called about homebound COVID19 vaccination program. Spoke with sister Dahlia Byes who stated that patient hasn't got COVID vaccine and interested in setting up but Phone battery is about to died and like [place] to call back again after 5 pm today.   Leanor Kail, PA -C

## 2020-05-03 ENCOUNTER — Telehealth: Payer: Self-pay | Admitting: Physician Assistant

## 2020-05-03 NOTE — Telephone Encounter (Signed)
I connected by phone with Vennie Homans and/or patient's caregiver on 05/03/2020 at 11:08 AM to discuss the potential vaccination through our Homebound vaccination initiative.   Spoke with sister Luana Shu and got all below information.   Prevaccination Checklist for COVID-19 Vaccines  1.  Are you feeling sick today? no  2.  Have you ever received a dose of a COVID-19 vaccine?  no      If yes, which one? None   3.  Have you ever had an allergic reaction: (This would include a severe reaction [ e.g., anaphylaxis] that required treatment with epinephrine or EpiPen or that caused you to go to the hospital.  It would also include an allergic reaction that occurred within 4 hours that caused hives, swelling, or respiratory distress, including wheezing.) A.  A previous dose of COVID-19 vaccine. no  B.  A vaccine or injectable therapy that contains multiple components, one of which is a COVID-19 vaccine component, but it is not known which component elicited the immediate reaction. no  C.  Are you allergic to polyethylene glycol? no   4.  Have you ever had an allergic reaction to another vaccine (other than COVID-19 vaccine) or an injectable medication? (This would include a severe reaction [ e.g., anaphylaxis] that required treatment with epinephrine or EpiPen or that caused you to go to the hospital.  It would also include an allergic reaction that occurred within 4 hours that caused hives, swelling, or respiratory distress, including wheezing.)  no   5.  Have you ever had a severe allergic reaction (e.g., anaphylaxis) to something other than a component of the COVID-19 vaccine, or any vaccine or injectable medication?  This would include food, pet, venom, environmental, or oral medication allergies.  yes - allergies to pets, dog, horses (animals)    6.  Have you received any vaccine in the last 14 days? no   7.  Have you ever had a positive test for COVID-19 or has a doctor ever told you  that you had COVID-19?  no   8.  Have you received passive antibody therapy (monoclonal antibodies or convalescent serum) as a treatment for COVID-19? no   9.  Do you have a weakened immune system caused by something such as HIV infection or cancer or do you take immunosuppressive drugs or therapies?  yes - no longer on any medication of cancer    10.  Do you have a bleeding disorder or are you taking a blood thinner? no   11.  Are you pregnant or breast-feeding? no   12.  Do you have dermal fillers? no    This patient is a 79 y.o. female that meets the FDA criteria to receive homebound vaccination. Patient or parent/caregiver understands they have the option to accept or refuse homebound vaccination.  Patient passed the pre-screening checklist and would like to proceed with homebound vaccination.  Based on questionnaire above, I recommend the patient be observed for 30 minutes.  There are an estimated # 0  of other household members/caregivers who are also interested in receiving the vaccine.   I will send the patient's information to our scheduling team who will reach out to schedule the patient and potential caregiver/family members for homebound vaccination.   Klarissa Mcilvain 05/03/2020 11:08 AM

## 2020-05-23 ENCOUNTER — Ambulatory Visit: Payer: Medicare PPO | Attending: Critical Care Medicine

## 2020-05-23 DIAGNOSIS — Z23 Encounter for immunization: Secondary | ICD-10-CM

## 2020-05-23 NOTE — Progress Notes (Signed)
   Covid-19 Vaccination Clinic  Name:  Tiffany Arnold    MRN: [id number] DOB: 10/11/1941  05/23/2020  Tiffany Arnold was observed post Covid-19 immunization for 15 minutes without incident. She was provided with Vaccine Information Sheet and instruction to access the V-Safe system.   Tiffany Arnold was instructed to call 911 with any severe reactions post vaccine: Marland Kitchen Difficulty breathing  . Swelling of face and throat  . A fast heartbeat  . A bad rash all over body  . Dizziness and weakness   Immunizations Administered    Name Date Dose VIS Date Route   Moderna COVID-19 Vaccine 05/23/2020  1:01 AM 0.5 mL 11/2019 Intramuscular   Manufacturer: Moderna   Lot: [id number]   [id number]

## 2020-05-24 ENCOUNTER — Ambulatory Visit (INDEPENDENT_AMBULATORY_CARE_PROVIDER_SITE_OTHER): Payer: Medicare PPO | Admitting: Vascular Surgery

## 2020-05-24 ENCOUNTER — Encounter (INDEPENDENT_AMBULATORY_CARE_PROVIDER_SITE_OTHER): Payer: Medicare PPO

## 2020-05-29 DIAGNOSIS — G4733 Obstructive sleep apnea (adult) (pediatric): Secondary | ICD-10-CM | POA: Diagnosis not present

## 2020-05-31 ENCOUNTER — Telehealth: Payer: Self-pay | Admitting: *Deleted

## 2020-05-31 NOTE — Telephone Encounter (Signed)
Tiffany Arnold with [facility] left VM at Triage. Judson Roch is asking if PCP is willing to prescribe pt dexamethasone for "bone pain" Judson Roch said she is complaining of more bone pain mainly on her left hip and she has known bone mets and Judson Roch thinks that may be the cause of her pain.  Tiffany Arnold request call back at [phone]

## 2020-06-03 NOTE — Telephone Encounter (Signed)
This is not something I have done in the past. Could her oncologist prescribe this for her?

## 2020-06-05 NOTE — Telephone Encounter (Signed)
Sarah with [facility] request cb about increased pain in lt hip and possible use of dexamethasone.

## 2020-06-05 NOTE — Telephone Encounter (Signed)
Tiffany Arnold spoke to Tiffany Arnold and gave VO to administer 4mg  of Dexamethasone QD

## 2020-06-07 ENCOUNTER — Other Ambulatory Visit: Payer: Self-pay | Admitting: Internal Medicine

## 2020-06-07 NOTE — Telephone Encounter (Signed)
Last filled 09/13/2019 with no refills...Marland Kitchen please advise if okay to refill

## 2020-06-20 ENCOUNTER — Ambulatory Visit: Payer: Medicare PPO | Attending: Critical Care Medicine

## 2020-06-20 DIAGNOSIS — Z23 Encounter for immunization: Secondary | ICD-10-CM

## 2020-06-20 NOTE — Progress Notes (Signed)
   Covid-19 Vaccination [facility]  Name:  Tiffany Arnold    MRN: [id number] DOB: 04-Dec-1941  06/20/2020  [name] was observed post Covid-19 immunization for 15 minutes without incident. She was provided with Vaccine Information Sheet and instruction to access the V-Safe system.   [name] was instructed to call 911 with any severe reactions post vaccine: Marland Kitchen Difficulty breathing  . Swelling of face and throat  . A fast heartbeat  . A bad rash all over body  . Dizziness and weakness   Immunizations Administered    Name Date Dose VIS Date Route   Moderna COVID-19 Vaccine 06/20/2020  1:01 PM 0.5 mL 11/2019 Intramuscular   Manufacturer: Moderna   Lot: [id number]   [place]: [phone]

## 2020-06-21 ENCOUNTER — Other Ambulatory Visit: Payer: Self-pay | Admitting: Internal Medicine

## 2020-06-26 ENCOUNTER — Other Ambulatory Visit: Payer: Self-pay | Admitting: Internal Medicine

## 2020-06-26 MED ORDER — FENTANYL 25 MCG/HR TD PT72: 1.0000 | MEDICATED_PATCH | TRANSDERMAL | 0 refills | Status: AC

## 2020-06-27 ENCOUNTER — Telehealth: Payer: Self-pay

## 2020-06-27 NOTE — Telephone Encounter (Signed)
Name of Medication: tramadol 50 mg Name of [facility]acy[facility] Last Fill or Written Date and Quantity: # 90 on 01/28/2019 by Traci shuford PA (hx med list) Last Office Visit and Type: 10/17/19 HFU Next Office Visit and Type: none scheduled  Crystal with Hospice left v/m that caregiver called Crystal and pt has three tramadol 50 mg left. Pt taking 1 q 8 h.

## 2020-06-27 NOTE — Telephone Encounter (Signed)
We just started her on a Fentanyl patch, hold off on Tramadol refill for now.

## 2020-06-28 NOTE — Telephone Encounter (Signed)
Is aware to hold off on Tramadol and trying Fentanyl patch for now

## 2020-06-29 ENCOUNTER — Telehealth: Payer: Self-pay

## 2020-06-29 ENCOUNTER — Telehealth: Payer: Self-pay | Admitting: Internal Medicine

## 2020-06-29 MED ORDER — MORPHINE SULFATE (CONCENTRATE) 20 MG/ML PO SOLN: 10.0000 mg | ORAL | 0 refills | Status: AC | PRN

## 2020-06-29 NOTE — Addendum Note (Signed)
Addended by: [name] on: 06/29/2020 10:53 AM   Modules accepted: Orders

## 2020-06-29 NOTE — Telephone Encounter (Signed)
Crystal with Hospice said that pts nurse has been using liquid morphine from the comfort kit; nurse is not sure if pt will have enough liquid morphine to get thru the weekend and request liquid morphine 20 mg/ml pt presently taking 0.25 ml q 2 h prn for severe pain be sent to [place]. Pt is also using fentanyl patch.  Crystal said she will be gone this afternoon but after review from Avie Echevaria NP this afternoon request cb and ask for Vicky to let her know that liquid morphine has been sent in.

## 2020-06-29 NOTE — Telephone Encounter (Signed)
Tiffany Arnold, with [facility] called requesting a script for the patient  She is requesting Morphine concentrate, 20 mg per ml.   10mg  every 2 hourse as needed    [place]

## 2020-06-29 NOTE — Telephone Encounter (Signed)
This medication was sent in this morning by Rollene Fare and Nicole Kindred is aware

## 2020-07-22 DEATH — deceased

## 2020-09-09 IMAGING — CT CT HEAD W/O CM
3 series · 15 of 47 positions shown, 18 images · non-contrast
Comparison: CT 09/08/2019, 04/13/2019

CLINICAL DATA: Fell in bathroom recent CT with positive PE

EXAM:
CT HEAD WITHOUT CONTRAST
TECHNIQUE: Contiguous axial images were obtained from the base of the skull
through the vertex without intravenous contrast.

[Series 3: head wo · axial · 0.47mm/px · z∈[-212,-77]mm · 9 of 33 slices shown, 12 images]
[im 3/33  brain]
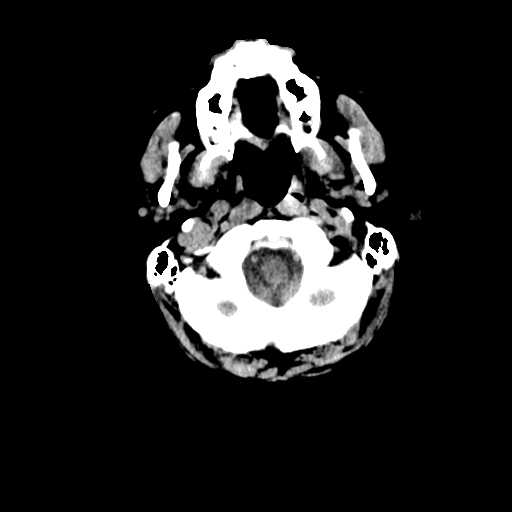
[im 3/33  bone]
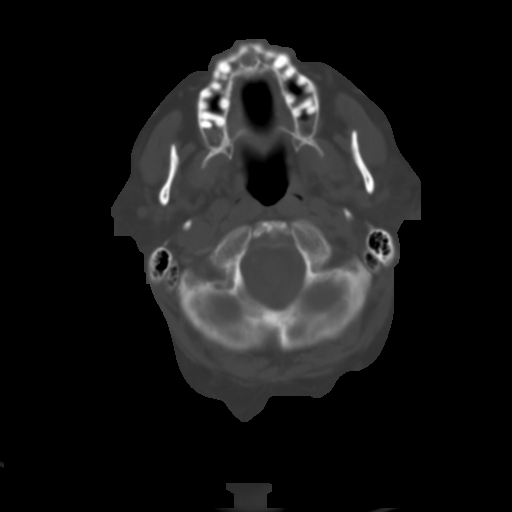
[im 6/33  brain]
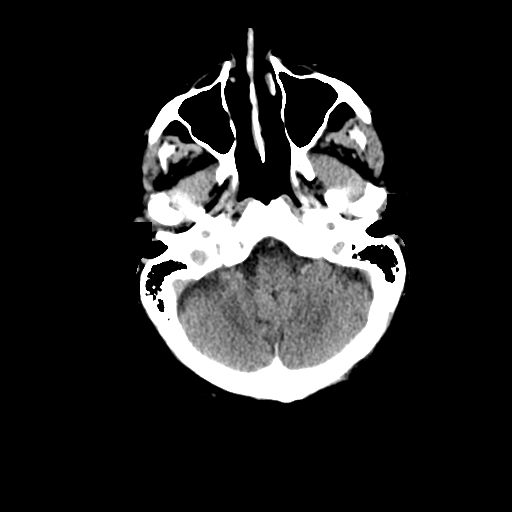
[im 9/33  brain]
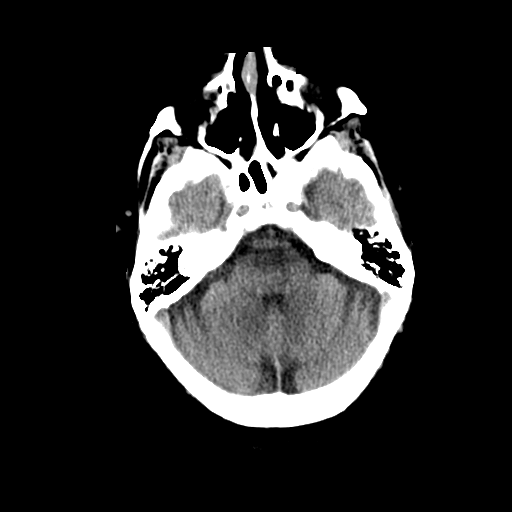
[im 13/33  brain]
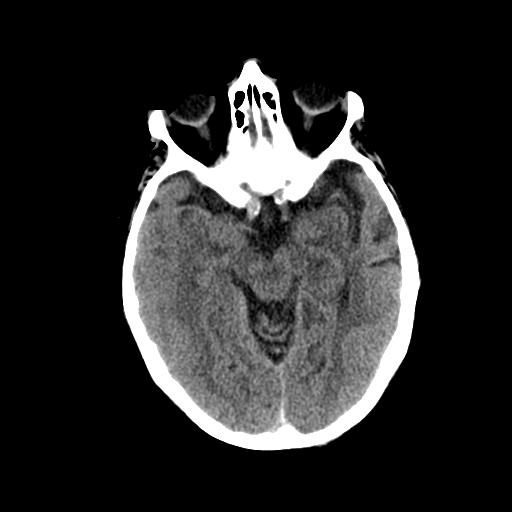
[im 17/33  brain]
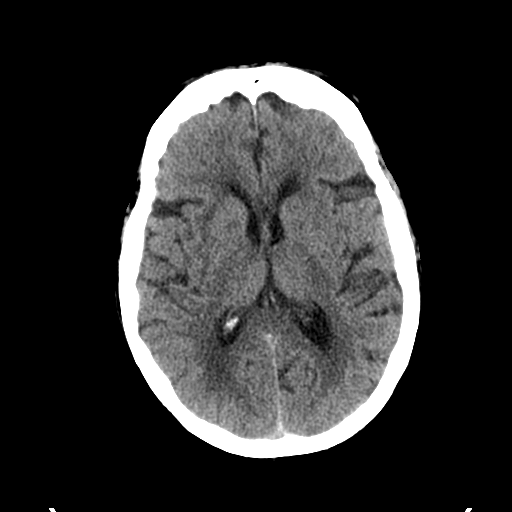
[im 17/33  bone]
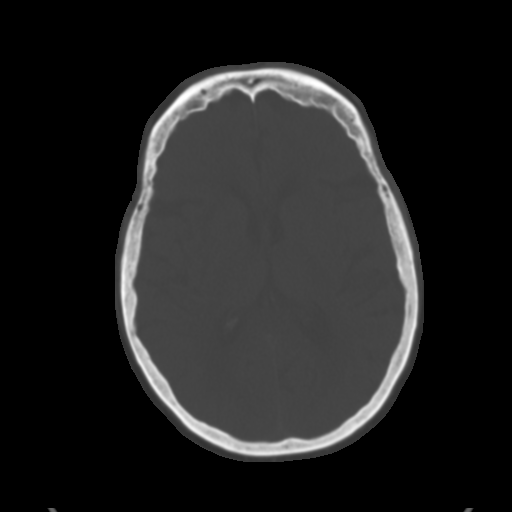
[im 20/33  brain]
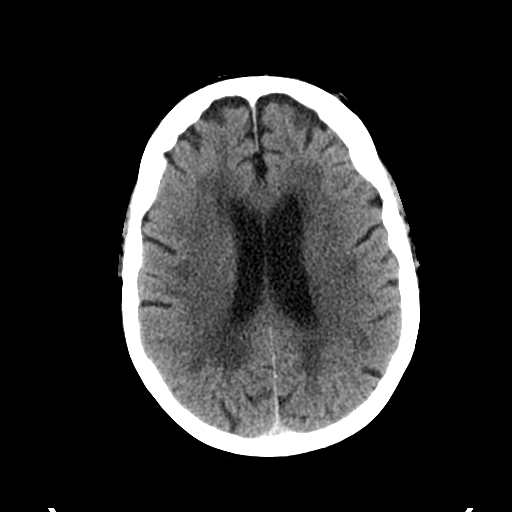
[im 24/33  brain]
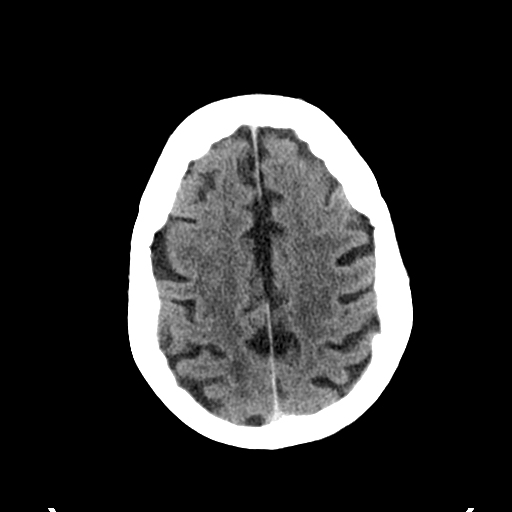
[im 27/33  brain]
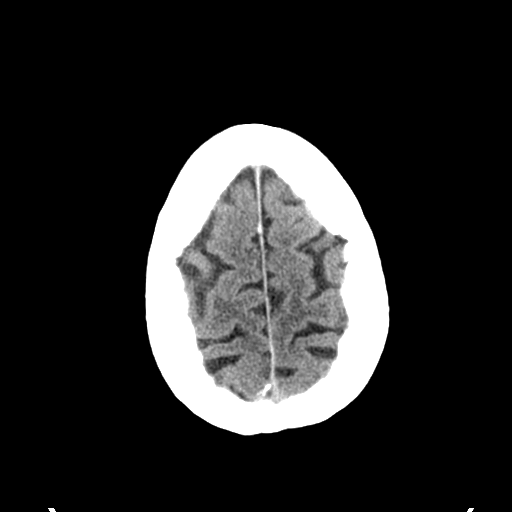
[im 30/33  brain]
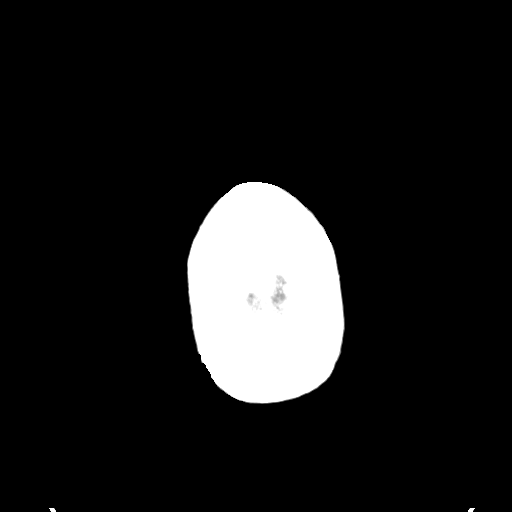
[im 30/33  bone]
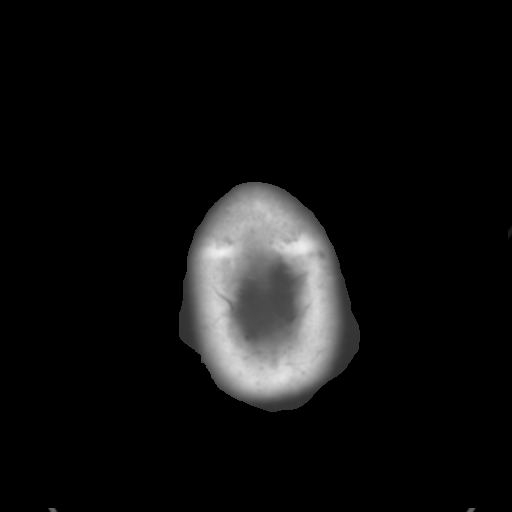

[Series 5: coronal soft tissue · coronal · 0.41mm/px · 3 of 67 slices shown]
[im 23/67  brain]
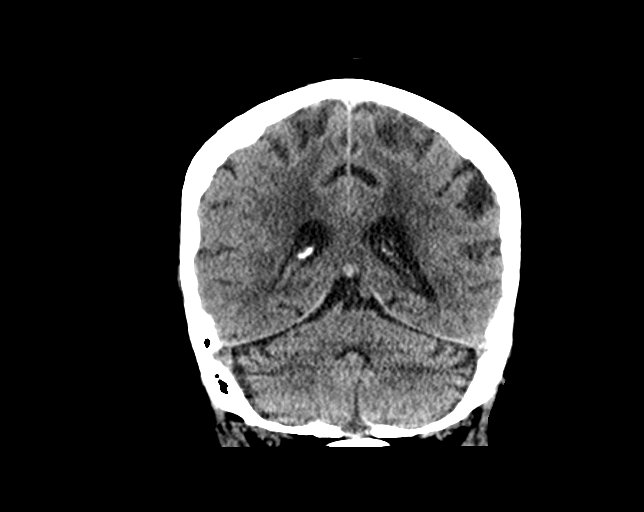
[im 30/67  brain]
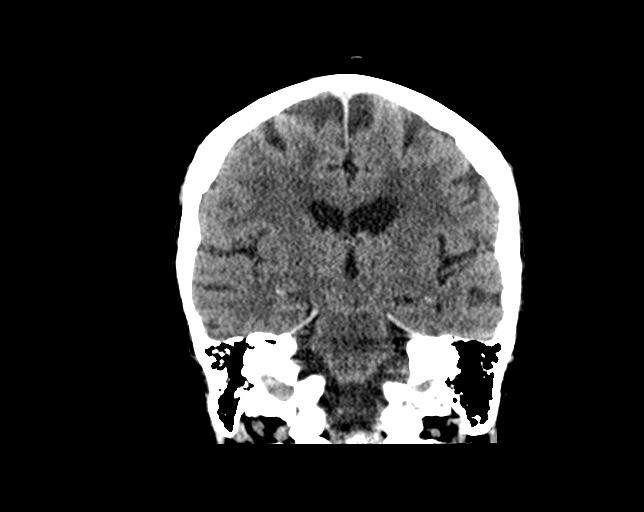
[im 37/67  brain]
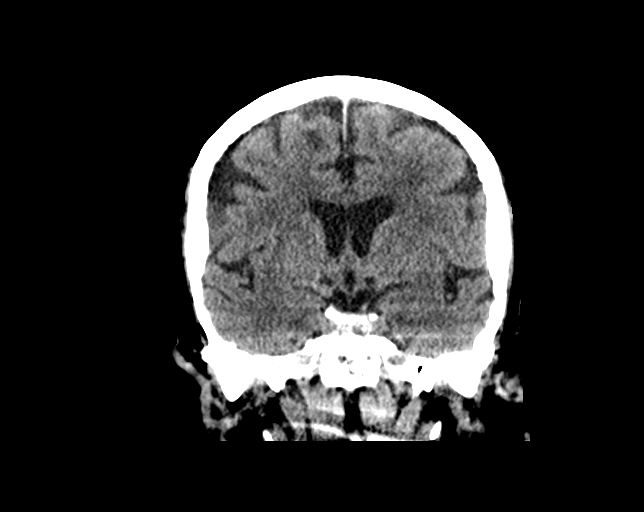

[Series 6: sagittal soft tissue · sagittal · 0.39mm/px · 3 of 51 slices shown]
[im 17/51  brain]
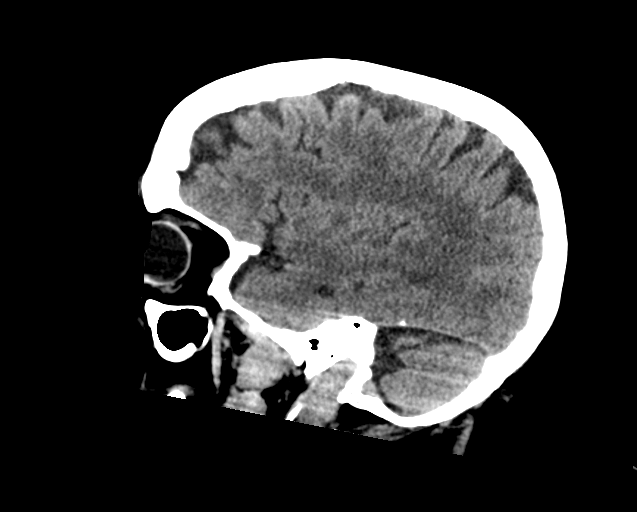
[im 26/51  brain]
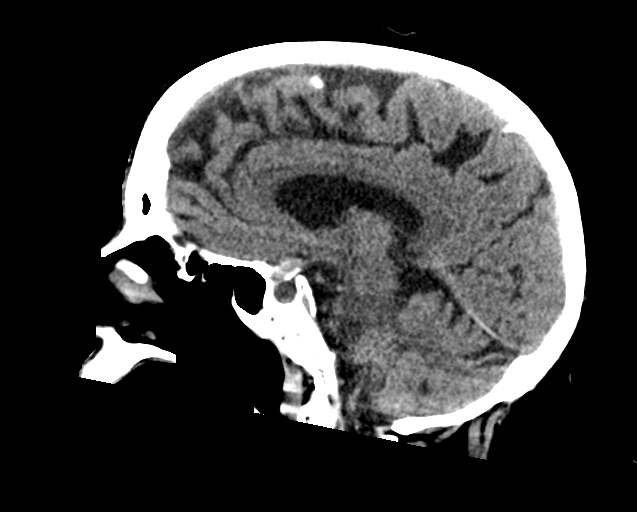
[im 34/51  brain]
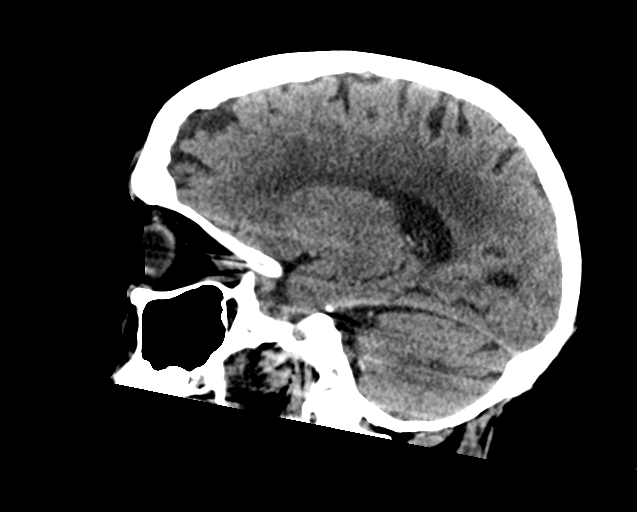

[15 of 47 positions shown; findings below may reference images not displayed]

FINDINGS: Brain: No acute territorial infarction, hemorrhage or intracranial
mass. Atrophy and moderate hypodensity in the white matter
consistent with small vessel ischemic change. Stable ventricle size

Vascular: No hyperdense vessels.  Carotid vascular calcification

Skull: Normal. Negative for fracture or focal lesion.

Sinuses/Orbits: Postsurgical changes of the maxillary and ethmoid
sinuses with mild mucosal thickening.

Other: Small left forehead soft tissue swelling
IMPRESSION: 1. No CT evidence for acute intracranial abnormality.
2. Atrophy and small vessel ischemic changes of white matter.

## 2020-09-12 IMAGING — DX DG ABDOMEN 1V
2 series · 2 of 2 positions shown · non-contrast
Comparison: None.

CLINICAL DATA: Constipation.  Abdominal pain.

EXAM:
ABDOMEN - 1 VIEW

[abdomen kub (1 of 2)]
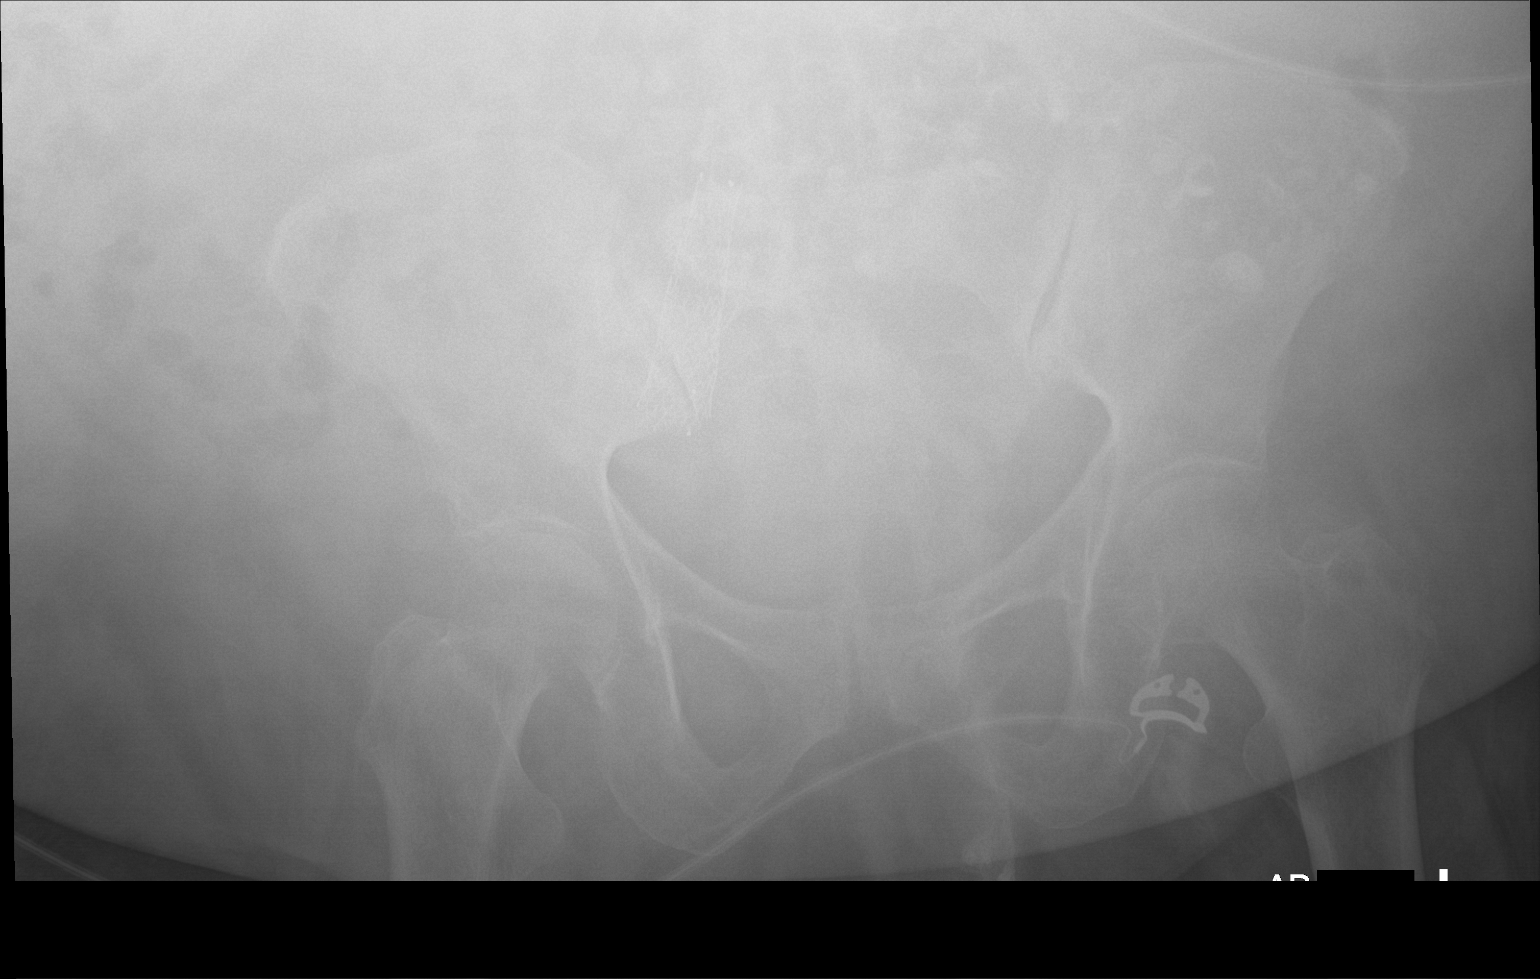

[abdomen kub (2 of 2)]
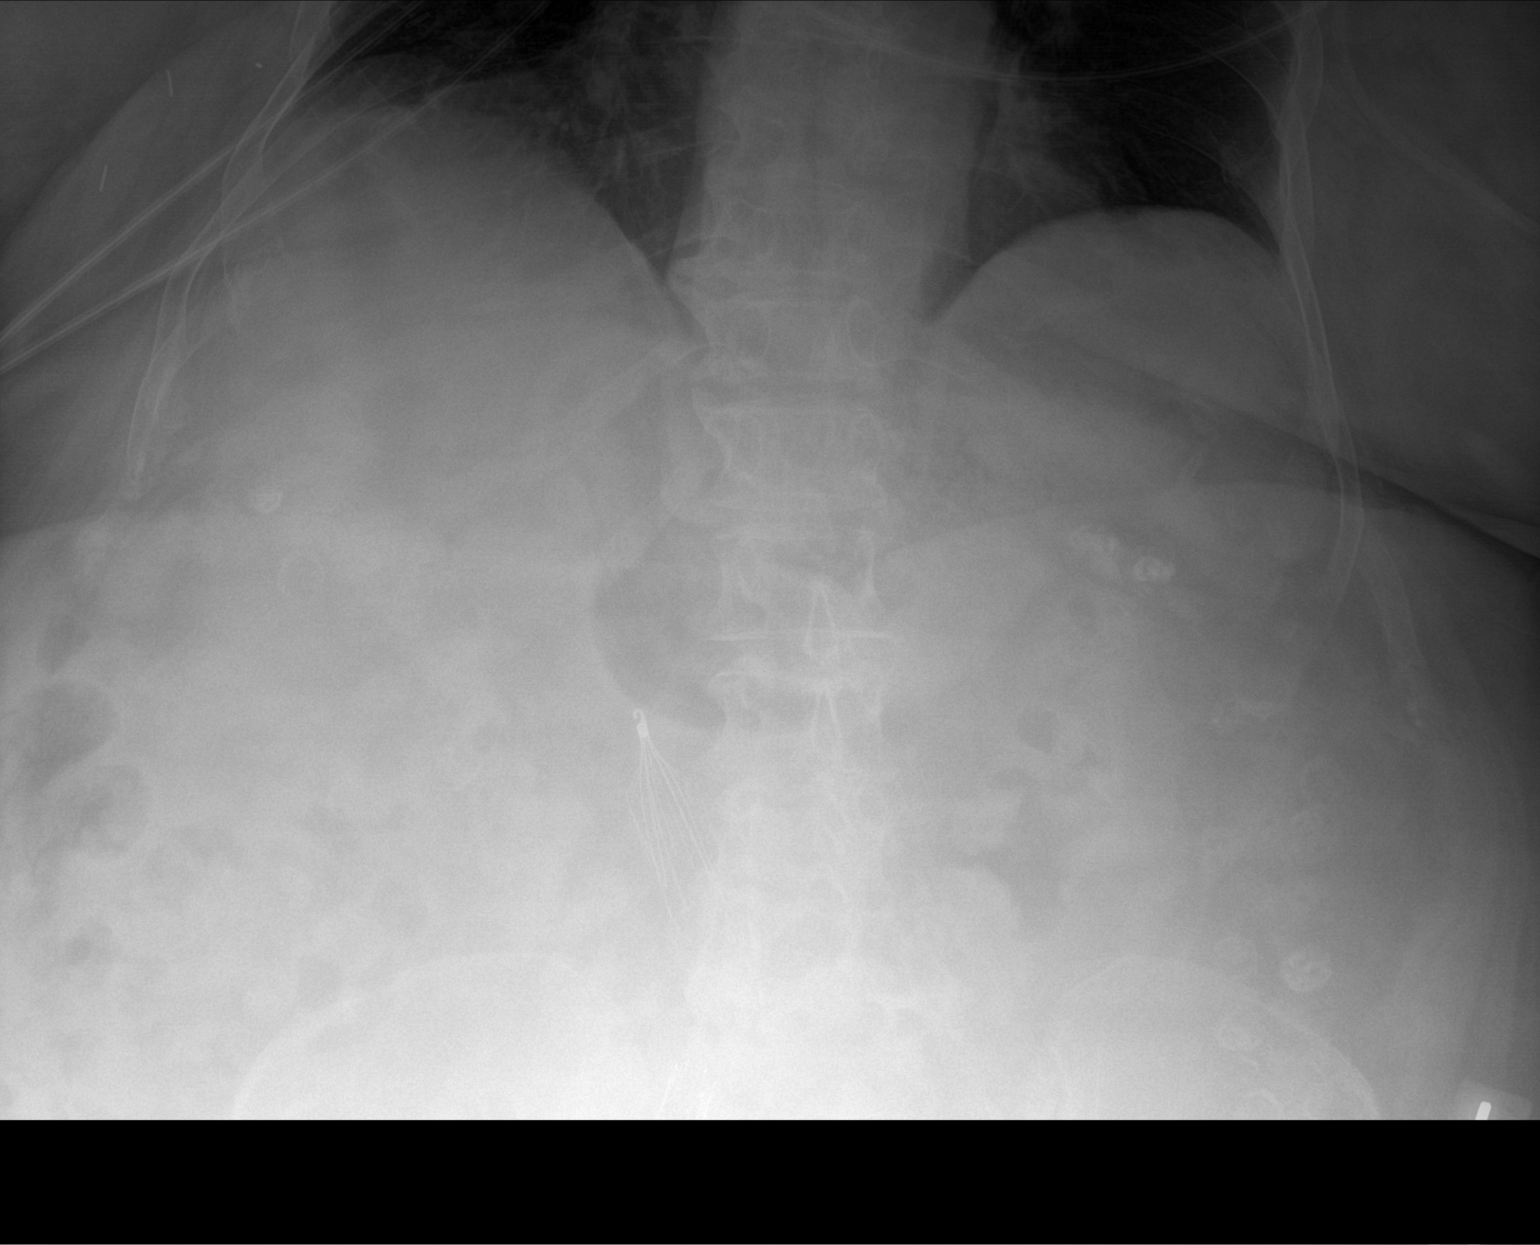

[2 of 2 positions shown; findings below may reference images not displayed]

FINDINGS: Normal bowel gas pattern.  No significant increase in colonic stool.

Vena cava filter projects to the right of L2 and L3. There is a
right common iliac artery stent. Soft tissues otherwise
unremarkable.

No acute skeletal abnormality.
IMPRESSION: 1. No acute findings.  No evidence of bowel obstruction.
2. No radiographic evidence of increased colonic stool.

## 2020-09-12 IMAGING — DX DG CHEST 1V PORT
1 series · 1 of 1 positions shown · non-contrast
Comparison: Chest x-rays dated 07/14/2019 and 04/27/2017. Chest CT
dated 09/08/2019.

CLINICAL DATA: Dyspnea. History of breast cancer, hypertension,
hyperlipidemia, diabetes, asthma.

EXAM:
PORTABLE CHEST 1 VIEW

[chest ap]
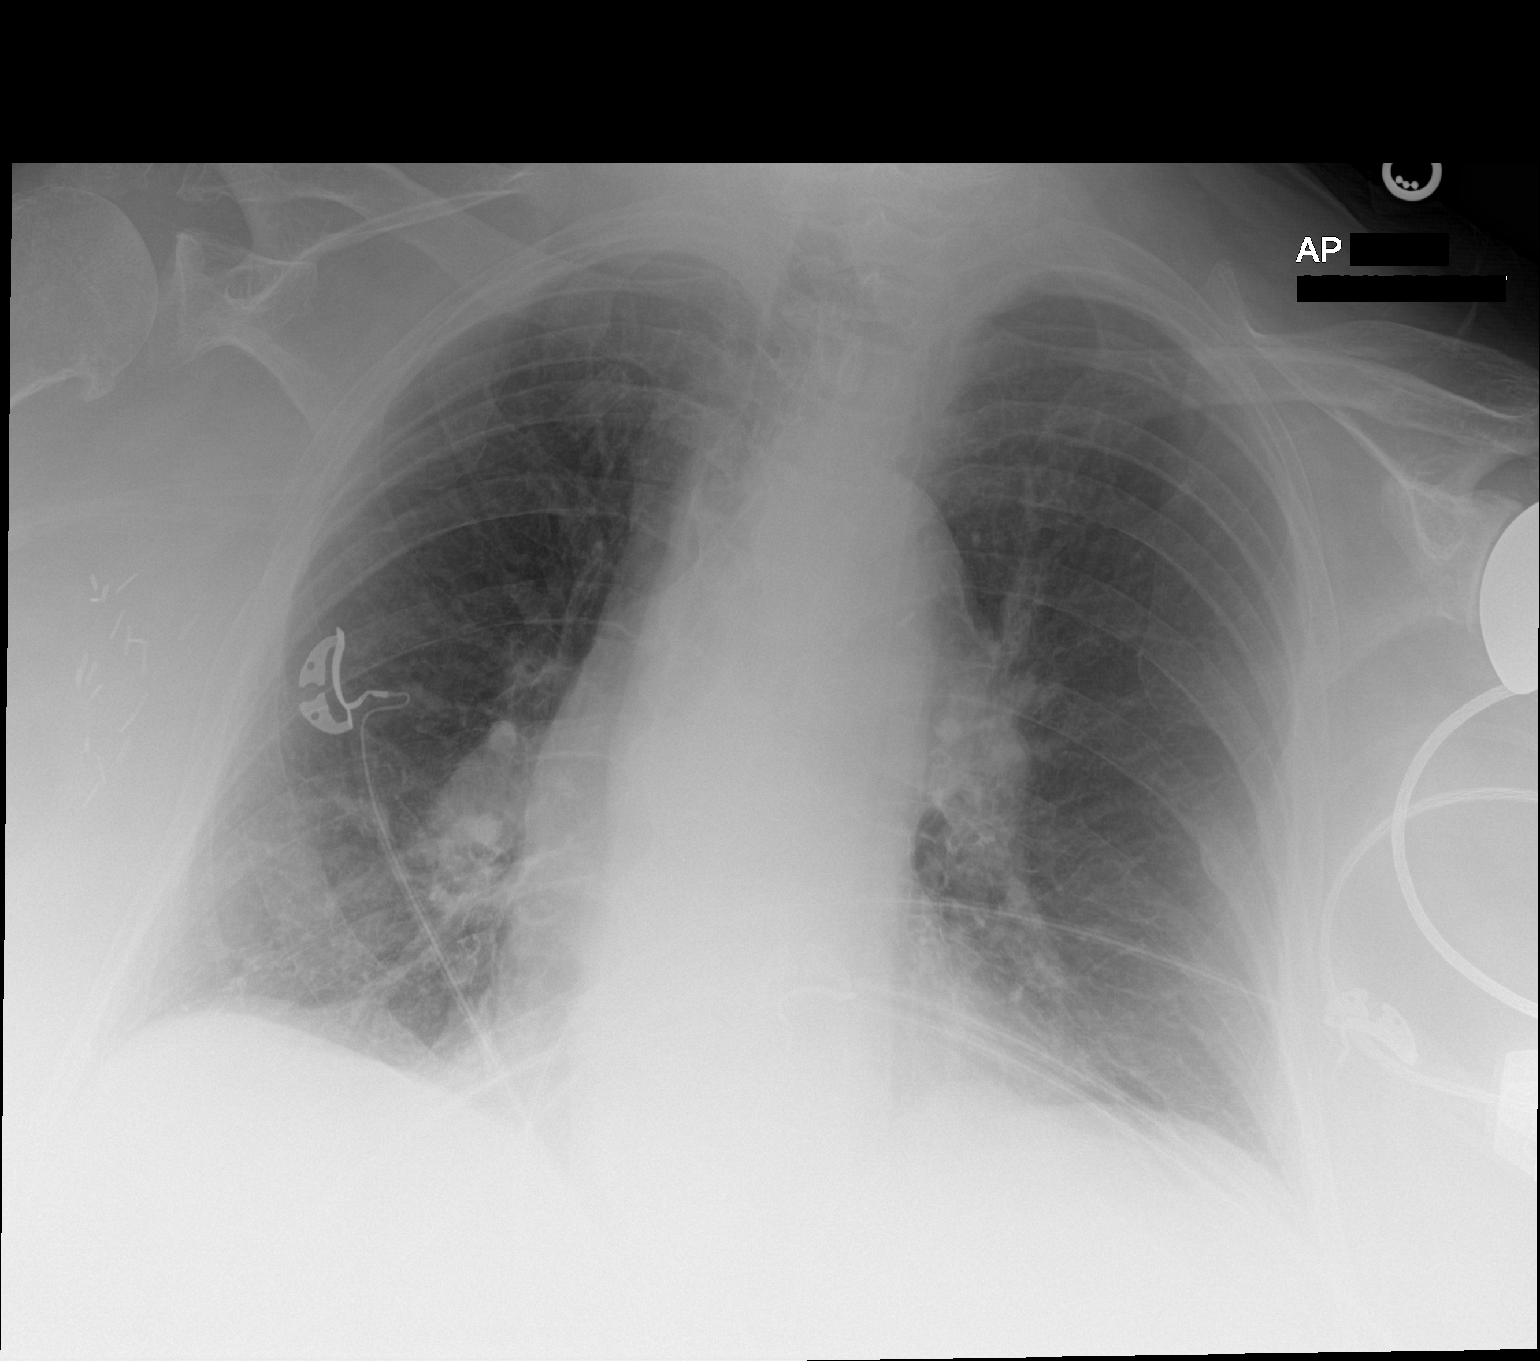

[1 of 1 positions shown; findings below may reference images not displayed]

FINDINGS: Heart size and mediastinal contours are stable. Lungs are clear. No
pleural effusion or pneumothorax is seen.
IMPRESSION: No evidence of pneumonia or pulmonary edema.
